# Patient Record
Sex: Male | Born: 1945
Health system: Southern US, Community
[De-identification: ages and names within clinical notes are randomized; demographics above are authoritative.]

## PROBLEM LIST (undated history)

## (undated) DIAGNOSIS — E119 Type 2 diabetes mellitus without complications: Secondary | ICD-10-CM

## (undated) DIAGNOSIS — I519 Heart disease, unspecified: Secondary | ICD-10-CM

## (undated) DIAGNOSIS — I219 Acute myocardial infarction, unspecified: Secondary | ICD-10-CM

## (undated) DIAGNOSIS — Z955 Presence of coronary angioplasty implant and graft: Secondary | ICD-10-CM

## (undated) DIAGNOSIS — I5032 Chronic diastolic (congestive) heart failure: Secondary | ICD-10-CM

## (undated) DIAGNOSIS — R0989 Other specified symptoms and signs involving the circulatory and respiratory systems: Secondary | ICD-10-CM

## (undated) DIAGNOSIS — I2510 Atherosclerotic heart disease of native coronary artery without angina pectoris: Secondary | ICD-10-CM

## (undated) DIAGNOSIS — F32A Depression, unspecified: Secondary | ICD-10-CM

## (undated) DIAGNOSIS — C801 Malignant (primary) neoplasm, unspecified: Secondary | ICD-10-CM

## (undated) DIAGNOSIS — Z951 Presence of aortocoronary bypass graft: Secondary | ICD-10-CM

## (undated) DIAGNOSIS — J449 Chronic obstructive pulmonary disease, unspecified: Secondary | ICD-10-CM

## (undated) DIAGNOSIS — N189 Chronic kidney disease, unspecified: Secondary | ICD-10-CM

## (undated) HISTORY — PX: CORONARY ARTERY BYPASS GRAFT: SHX141

## (undated) HISTORY — DX: Presence of coronary angioplasty implant and graft: Z95.5

## (undated) HISTORY — DX: Malignant (primary) neoplasm, unspecified: C80.1

## (undated) HISTORY — DX: Type 2 diabetes mellitus without complications: E11.9

## (undated) HISTORY — DX: Depression, unspecified: F32.A

## (undated) HISTORY — DX: Chronic obstructive pulmonary disease, unspecified: J44.9

## (undated) HISTORY — PX: EYE SURGERY: SHX253

## (undated) HISTORY — PX: IR STENT PLACEMENT ANTE CAROTID INC ANGIO: IMG5522

## (undated) HISTORY — PX: KNEE SURGERY: SHX244

## (undated) HISTORY — PX: ARTERIAL BYPASS SURGRY: SHX557

## (undated) HISTORY — DX: Heart disease, unspecified: I51.9

## (undated) HISTORY — DX: Chronic kidney disease, unspecified: N18.9

---

## 1992-03-15 HISTORY — PX: TRANSURETHRAL RESECTION OF PROSTATE: SHX73

## 1993-03-15 HISTORY — PX: URINARY SPHINCTER IMPLANT: SHX2624

## 1995-12-26 HISTORY — PX: CHOLECYSTECTOMY, LAPAROSCOPIC: SHX56

## 2011-03-16 DIAGNOSIS — C679 Malignant neoplasm of bladder, unspecified: Secondary | ICD-10-CM

## 2011-03-16 DIAGNOSIS — C801 Malignant (primary) neoplasm, unspecified: Secondary | ICD-10-CM

## 2011-03-16 HISTORY — DX: Malignant neoplasm of bladder, unspecified: C67.9

## 2011-03-16 HISTORY — DX: Malignant (primary) neoplasm, unspecified: C80.1

## 2017-08-10 LAB — HM HEPATITIS C SCREENING LAB: HM Hepatitis Screen: NEGATIVE

## 2018-12-19 DIAGNOSIS — N183 Chronic kidney disease, stage 3 unspecified: Secondary | ICD-10-CM | POA: Diagnosis not present

## 2018-12-26 DIAGNOSIS — Z713 Dietary counseling and surveillance: Secondary | ICD-10-CM | POA: Diagnosis not present

## 2018-12-26 DIAGNOSIS — I129 Hypertensive chronic kidney disease with stage 1 through stage 4 chronic kidney disease, or unspecified chronic kidney disease: Secondary | ICD-10-CM | POA: Diagnosis not present

## 2018-12-26 DIAGNOSIS — N1831 Chronic kidney disease, stage 3a: Secondary | ICD-10-CM | POA: Diagnosis not present

## 2018-12-26 DIAGNOSIS — E1121 Type 2 diabetes mellitus with diabetic nephropathy: Secondary | ICD-10-CM | POA: Diagnosis not present

## 2018-12-29 DIAGNOSIS — E119 Type 2 diabetes mellitus without complications: Secondary | ICD-10-CM | POA: Diagnosis not present

## 2018-12-29 DIAGNOSIS — I10 Essential (primary) hypertension: Secondary | ICD-10-CM | POA: Diagnosis not present

## 2018-12-29 DIAGNOSIS — E1140 Type 2 diabetes mellitus with diabetic neuropathy, unspecified: Secondary | ICD-10-CM | POA: Diagnosis not present

## 2018-12-29 DIAGNOSIS — E785 Hyperlipidemia, unspecified: Secondary | ICD-10-CM | POA: Diagnosis not present

## 2018-12-29 DIAGNOSIS — I2510 Atherosclerotic heart disease of native coronary artery without angina pectoris: Secondary | ICD-10-CM | POA: Diagnosis not present

## 2018-12-29 DIAGNOSIS — Z23 Encounter for immunization: Secondary | ICD-10-CM | POA: Diagnosis not present

## 2019-02-28 DIAGNOSIS — J0100 Acute maxillary sinusitis, unspecified: Secondary | ICD-10-CM | POA: Diagnosis not present

## 2019-02-28 DIAGNOSIS — Z20828 Contact with and (suspected) exposure to other viral communicable diseases: Secondary | ICD-10-CM | POA: Diagnosis not present

## 2019-02-28 DIAGNOSIS — R0981 Nasal congestion: Secondary | ICD-10-CM | POA: Diagnosis not present

## 2019-03-20 DIAGNOSIS — N393 Stress incontinence (female) (male): Secondary | ICD-10-CM | POA: Diagnosis not present

## 2019-03-20 DIAGNOSIS — N400 Enlarged prostate without lower urinary tract symptoms: Secondary | ICD-10-CM | POA: Diagnosis not present

## 2019-03-20 DIAGNOSIS — C670 Malignant neoplasm of trigone of bladder: Secondary | ICD-10-CM | POA: Diagnosis not present

## 2019-05-01 DIAGNOSIS — I2510 Atherosclerotic heart disease of native coronary artery without angina pectoris: Secondary | ICD-10-CM | POA: Diagnosis not present

## 2019-05-01 DIAGNOSIS — E119 Type 2 diabetes mellitus without complications: Secondary | ICD-10-CM | POA: Diagnosis not present

## 2019-05-01 DIAGNOSIS — I10 Essential (primary) hypertension: Secondary | ICD-10-CM | POA: Diagnosis not present

## 2019-05-01 DIAGNOSIS — E785 Hyperlipidemia, unspecified: Secondary | ICD-10-CM | POA: Diagnosis not present

## 2019-07-20 DIAGNOSIS — N390 Urinary tract infection, site not specified: Secondary | ICD-10-CM | POA: Diagnosis not present

## 2019-07-20 DIAGNOSIS — G8929 Other chronic pain: Secondary | ICD-10-CM | POA: Diagnosis not present

## 2019-07-20 DIAGNOSIS — M545 Low back pain: Secondary | ICD-10-CM | POA: Diagnosis not present

## 2019-07-26 DIAGNOSIS — M25561 Pain in right knee: Secondary | ICD-10-CM | POA: Diagnosis not present

## 2019-07-26 DIAGNOSIS — M25562 Pain in left knee: Secondary | ICD-10-CM | POA: Diagnosis not present

## 2019-08-24 DIAGNOSIS — E1165 Type 2 diabetes mellitus with hyperglycemia: Secondary | ICD-10-CM | POA: Diagnosis not present

## 2019-08-24 DIAGNOSIS — N189 Chronic kidney disease, unspecified: Secondary | ICD-10-CM | POA: Diagnosis not present

## 2019-08-24 DIAGNOSIS — I129 Hypertensive chronic kidney disease with stage 1 through stage 4 chronic kidney disease, or unspecified chronic kidney disease: Secondary | ICD-10-CM | POA: Diagnosis not present

## 2019-08-24 DIAGNOSIS — E1140 Type 2 diabetes mellitus with diabetic neuropathy, unspecified: Secondary | ICD-10-CM | POA: Diagnosis not present

## 2019-08-24 DIAGNOSIS — E1121 Type 2 diabetes mellitus with diabetic nephropathy: Secondary | ICD-10-CM | POA: Diagnosis not present

## 2019-08-24 DIAGNOSIS — Z125 Encounter for screening for malignant neoplasm of prostate: Secondary | ICD-10-CM | POA: Diagnosis not present

## 2019-08-24 DIAGNOSIS — E1122 Type 2 diabetes mellitus with diabetic chronic kidney disease: Secondary | ICD-10-CM | POA: Diagnosis not present

## 2019-12-24 DIAGNOSIS — I129 Hypertensive chronic kidney disease with stage 1 through stage 4 chronic kidney disease, or unspecified chronic kidney disease: Secondary | ICD-10-CM | POA: Diagnosis not present

## 2019-12-24 DIAGNOSIS — E1122 Type 2 diabetes mellitus with diabetic chronic kidney disease: Secondary | ICD-10-CM | POA: Diagnosis not present

## 2019-12-24 DIAGNOSIS — E1165 Type 2 diabetes mellitus with hyperglycemia: Secondary | ICD-10-CM | POA: Diagnosis not present

## 2019-12-24 DIAGNOSIS — E785 Hyperlipidemia, unspecified: Secondary | ICD-10-CM | POA: Diagnosis not present

## 2019-12-24 DIAGNOSIS — E1121 Type 2 diabetes mellitus with diabetic nephropathy: Secondary | ICD-10-CM | POA: Diagnosis not present

## 2019-12-24 DIAGNOSIS — Z23 Encounter for immunization: Secondary | ICD-10-CM | POA: Diagnosis not present

## 2019-12-24 DIAGNOSIS — E1140 Type 2 diabetes mellitus with diabetic neuropathy, unspecified: Secondary | ICD-10-CM | POA: Diagnosis not present

## 2020-03-19 DIAGNOSIS — N400 Enlarged prostate without lower urinary tract symptoms: Secondary | ICD-10-CM | POA: Diagnosis not present

## 2020-03-19 DIAGNOSIS — C670 Malignant neoplasm of trigone of bladder: Secondary | ICD-10-CM | POA: Diagnosis not present

## 2020-04-18 ENCOUNTER — Encounter: Payer: Self-pay | Admitting: Adult Health

## 2020-04-18 ENCOUNTER — Other Ambulatory Visit: Payer: Self-pay

## 2020-04-18 ENCOUNTER — Ambulatory Visit (INDEPENDENT_AMBULATORY_CARE_PROVIDER_SITE_OTHER): Payer: Medicare Other | Admitting: Adult Health

## 2020-04-18 VITALS — BP 154/64 | HR 56 | Temp 98.5°F | Resp 18 | Ht 70.0 in | Wt 292.0 lb

## 2020-04-18 DIAGNOSIS — J449 Chronic obstructive pulmonary disease, unspecified: Secondary | ICD-10-CM | POA: Diagnosis not present

## 2020-04-18 DIAGNOSIS — I2510 Atherosclerotic heart disease of native coronary artery without angina pectoris: Secondary | ICD-10-CM | POA: Insufficient documentation

## 2020-04-18 DIAGNOSIS — E118 Type 2 diabetes mellitus with unspecified complications: Secondary | ICD-10-CM

## 2020-04-18 DIAGNOSIS — E119 Type 2 diabetes mellitus without complications: Secondary | ICD-10-CM | POA: Insufficient documentation

## 2020-04-18 DIAGNOSIS — I252 Old myocardial infarction: Secondary | ICD-10-CM

## 2020-04-18 DIAGNOSIS — Z87891 Personal history of nicotine dependence: Secondary | ICD-10-CM | POA: Insufficient documentation

## 2020-04-18 DIAGNOSIS — E1142 Type 2 diabetes mellitus with diabetic polyneuropathy: Secondary | ICD-10-CM | POA: Insufficient documentation

## 2020-04-18 DIAGNOSIS — Z6841 Body Mass Index (BMI) 40.0 and over, adult: Secondary | ICD-10-CM

## 2020-04-18 DIAGNOSIS — Z1389 Encounter for screening for other disorder: Secondary | ICD-10-CM | POA: Diagnosis not present

## 2020-04-18 DIAGNOSIS — E559 Vitamin D deficiency, unspecified: Secondary | ICD-10-CM

## 2020-04-18 DIAGNOSIS — Z955 Presence of coronary angioplasty implant and graft: Secondary | ICD-10-CM | POA: Insufficient documentation

## 2020-04-18 DIAGNOSIS — N1831 Chronic kidney disease, stage 3a: Secondary | ICD-10-CM | POA: Insufficient documentation

## 2020-04-18 DIAGNOSIS — I25810 Atherosclerosis of coronary artery bypass graft(s) without angina pectoris: Secondary | ICD-10-CM | POA: Insufficient documentation

## 2020-04-18 LAB — POCT URINALYSIS DIPSTICK
Bilirubin, UA: NEGATIVE
Blood, UA: NEGATIVE
Glucose, UA: NEGATIVE
Ketones, UA: NEGATIVE
Leukocytes, UA: NEGATIVE
Nitrite, UA: NEGATIVE
Protein, UA: POSITIVE — AB
Spec Grav, UA: 1.015 (ref 1.010–1.025)
Urobilinogen, UA: 0.2 E.U./dL
pH, UA: 5 (ref 5.0–8.0)

## 2020-04-18 MED ORDER — BLOOD GLUCOSE METER KIT: PACK | 0 refills | Status: DC

## 2020-04-18 MED ORDER — ALBUTEROL SULFATE HFA 108 (90 BASE) MCG/ACT IN AERS: 2.0000 | INHALATION_SPRAY | Freq: Four times a day (QID) | RESPIRATORY_TRACT | 2 refills | Status: DC | PRN

## 2020-04-18 MED ORDER — ADVAIR HFA 230-21 MCG/ACT IN AERO: 1.0000 | INHALATION_SPRAY | Freq: Two times a day (BID) | RESPIRATORY_TRACT | 12 refills | Status: DC

## 2020-04-18 NOTE — Progress Notes (Signed)
New patient visit   Patient: Tony Williams   DOB: [birth year]   74 y.o. Male  MRN: [id number] Visit Date: 04/18/2020  Today's healthcare provider: Marcille Buffy, FNP   Chief Complaint  Patient presents with  . New Patient (Initial Visit)   Subjective    Tony Williams is a 75 y.o. male who presents today as a new patient to establish care.  HPI  Patient presents in office today to establish care he states that he feels fairly well today but does have concerns to address. Patient reports that he moved from [place], [place] and would like to discuss his medications today.  He saw Dr. Gerarda Fraction and he moved and is here today to get a new primary care due to moving.   2007 quadruple bypass, and has had coronary stents in past.  He needs a cardiologist, he has not seen Dr. Carleene Mains in [place] [facility] in 2 years since covid pandeic.   He has been covid vaccinated x 2 and had booster.  He sees urologist.Dr. Gloris Ham and sees urologist, history of bladder cancer and has and AUS - artificial bladder spinchter.  He reports he has only been taking his metoprolol 25 mg once daily, but he is never taken it twice daily and that he takes his Amaryl 1 mg twice daily.  He does not check his blood glucose at home, he reports he does not have a meter supplies.  He has chronic shortness of breath, he reports he has seen specialist, he has COPD and right lung scarring. He reports shoirtness of breath is chronic shortness of breath unchaneged. Quit smoking since 2005 , 1ppd for around 30 days.  Discussed he would qualify for low-dose CT scan, patient declines at this time.  Patient reports that he has been taking hydrocodone 12/16/2023 for many years for his chronic back pain, he does not want any further work-up done as he is told he is not a surgical candidate due to his lungs he has history of CABG, he has chronic bilateral feet pain does not want any work-up done for that as well.  I can see a  history that he has followed up consistently with his primary care in [place] and he reports that he does not have any abnormal side effects of the medication and is able to function no drowsiness.  And he takes only as needed but does have to take most days.  He has never seen pain management.  He reports that he has seen pulmonary in the past and was told that there was nothing else they can do for him, I did offer him a pulmonary referral today but he declines.  He has been using albuterol 3 times a day.   Patient reports that he has a general diet, he is not actively exercising at this time due to complaints of shortness of breath on exertion.  Patient reports that sleep patterns are fair.    Denies dizziness, lightheadedness, pre syncopal or syncopal episodes.   Past Medical History:  Diagnosis Date  . Cancer ([place])   . Chronic kidney disease   . COPD (chronic obstructive pulmonary disease) ([place])   . Depression   . Diabetes mellitus without complication ([place])   . Heart problem    Past Surgical History:  Procedure Laterality Date  . EYE SURGERY     Family Status  Relation Name Status  . Mother  (Not Specified)  . Father  (Not Specified)   Family History  Problem Relation Age of Onset  . Heart Problems Mother   . Heart Problems Father    Social History   Socioeconomic History  . Marital status: Married    Spouse name: Not on file  . Number of children: Not on file  . Years of education: Not on file  . Highest education level: Not on file  Occupational History  . Not on file  Tobacco Use  . Smoking status: Former Smoker    Quit date: 10/23/2003    Years since quitting: 16.4  . Smokeless tobacco: Never Used  Substance and Sexual Activity  . Alcohol use: Never  . Drug use: Never  . Sexual activity: Not on file  Other Topics Concern  . Not on file  Social History Narrative  . Not on file   Social Determinants of Health   Financial Resource Strain: Not on file   Food Insecurity: Not on file  Transportation Needs: Not on file  Physical Activity: Not on file  Stress: Not on file  Social Connections: Not on file   Outpatient Medications Prior to Visit  Medication Sig  . allopurinol (ZYLOPRIM) 300 MG tablet Take 300 mg by mouth daily.  Mar[place] Kitchen amLODipine (NORVASC) 10 MG tablet Take 10 mg by mouth daily.  Mar[place] Kitchen aspirin EC 81 MG tablet Take 81 mg by mouth daily. Swallow whole.  . Cholecalciferol (VITAMIN D3 PO) Take 4,000 Units by mouth.  . clopidogrel (PLAVIX) 75 MG tablet Take 75 mg by mouth daily.  . Coenzyme Q10 (CO Q-10) 400 MG CAPS Take by mouth.  . escitalopram (LEXAPRO) 20 MG tablet Take 20 mg by mouth daily.  Marland Kitchen gabapentin (NEURONTIN) 300 MG capsule Take 300 mg by mouth at bedtime.  Marland Kitchen glimepiride (AMARYL) 1 MG tablet Take by mouth 2 (two) times daily.  Marland Kitchen HYDROcodone-acetaminophen (NORCO) 10-325 MG tablet Take 1 tablet by mouth 3 (three) times daily.  Marland Kitchen lovastatin (MEVACOR) 40 MG tablet Take 40 mg by mouth daily.  . metoprolol tartrate (LOPRESSOR) 25 MG tablet Take 25 mg by mouth daily at 6 (six) AM.  . Omega-3 Fatty Acids (FISH OIL) 1200 MG CAPS Take by mouth.  . traZODone (DESYREL) 150 MG tablet Take 150-300 mg by mouth at bedtime as needed.   No facility-administered medications prior to visit.   No Known Allergies   There is no immunization history on file for this patient.  Health Maintenance  Topic Date Due  . HEMOGLOBIN A1C  Never done  . Hepatitis C Screening  Never done  . COVID-19 Vaccine (1) Never done  . FOOT EXAM  Never done  . OPHTHALMOLOGY EXAM  Never done  . URINE MICROALBUMIN  Never done  . TETANUS/TDAP  Never done  . COLONOSCOPY (Pts 45-36yr Insurance coverage will need to be confirmed)  Never done  . PNA vac Low Risk Adult (1 of 2 - PCV13) Never done  . INFLUENZA VACCINE  Never done    Patient Care Team: FDoreen Beam FNP as PCP - General (Family Medicine)  Review of Systems  Respiratory: Positive for  shortness of breath.   Musculoskeletal: Positive for arthralgias.  Psychiatric/Behavioral: Positive for sleep disturbance.  All other systems reviewed and are negative.     Objective    BP (!) 154/64   Pulse (!) 56   Temp 98.5 F (36.9 C) (Oral)   Resp 18   Ht 5' 10" (1.778 m)   Wt 292 lb (132.5 kg)   SpO2 92%   BMI  41.90 kg/m  Physical Exam Vitals reviewed.  Constitutional:      General: He is not in acute distress.    Appearance: Normal appearance. He is obese. He is not ill-appearing, toxic-appearing or diaphoretic.  HENT:     Head: Normocephalic and atraumatic.     Right Ear: Tympanic membrane, ear canal and external ear normal.     Left Ear: Tympanic membrane, ear canal and external ear normal.     Nose: Nose normal. No congestion or rhinorrhea.     Mouth/Throat:     Mouth: Mucous membranes are moist.     Pharynx: Oropharynx is clear.  Eyes:     General: No scleral icterus.       Right eye: No discharge.        Left eye: No discharge.     Extraocular Movements: Extraocular movements intact.     Conjunctiva/sclera: Conjunctivae normal.     Pupils: Pupils are equal, round, and reactive to light.  Cardiovascular:     Rate and Rhythm: Regular rhythm. Bradycardia present.     Pulses: Normal pulses.     Heart sounds: Normal heart sounds.  Pulmonary:     Effort: Pulmonary effort is normal.     Breath sounds: Normal breath sounds.  Abdominal:     General: There is no distension.     Palpations: Abdomen is soft.     Tenderness: There is no abdominal tenderness.     Comments: Round   Musculoskeletal:        General: Normal range of motion.  Skin:    General: Skin is warm.  Neurological:     Mental Status: He is alert and oriented to person, place, and time.     Cranial Nerves: No cranial nerve deficit.     Motor: No weakness.     Gait: Gait normal.  Psychiatric:        Mood and Affect: Mood normal.        Behavior: Behavior normal.        Thought Content:  Thought content normal.        Judgment: Judgment normal.      Depression Screen PHQ 2/9 Scores 04/18/2020  Exception Documentation Patient refusal   Results for orders placed or performed in visit on 04/18/20  POCT urinalysis dipstick  Result Value Ref Range   Color, UA dark yellow    Clarity, UA clear    Glucose, UA Negative Negative   Bilirubin, UA negative    Ketones, UA negative    Spec Grav, UA 1.015 1.010 - 1.025   Blood, UA negative    pH, UA 5.0 5.0 - 8.0   Protein, UA Positive (A) Negative   Urobilinogen, UA 0.2 0.2 or 1.0 E.U./dL   Nitrite, UA negative    Leukocytes, UA Negative Negative   Appearance     Odor      Assessment & Plan     1. Chronic obstructive pulmonary disease, unspecified COPD type [facility]) Provider recommends follow-up with pulmonary, offered to make a referral, however patient declines at this time.  He does request refill on his albuterol inhaler which he is using around 3 times daily.  Also advised patient that I will send in Advair Diskus for him to use twice daily.  Strongly encourage patient to request pulmonary referral when he is ready. Also has a 30-year 1 pack of tobacco history, he has quit.  Recommend low-dose CT screening and pulmonary follow-up.  2. Screening for blood  or protein in urine  - POCT urinalysis dipstick  3. Vitamin D insufficiency See medications he is currently on vitamin D and has not had his levels checked recently. - VITAMIN D 25 Hydroxy (Vit-D Deficiency, Fractures)  4. Coronary artery disease with hx of myocardial infarct w/o hx of CABG I do not have his previous records from cardiology, he does request a new cardiologist here in town since he moved from [place] will refer [facility].  Patient is to call if he has not heard from cardiology within 2 weeks. - Ambulatory referral to Cardiology  5. Controlled type 2 diabetes mellitus with complication, without long-term current use of insulin  (HCC) We will check A1c today and kidney function.  Prescribed a meter given hard prescription for supplies and meter per insurance. - CBC with Differential/Platelet - Lipid panel - TSH - PSA - HgB A1c - blood glucose meter kit and supplies; Dispense based on patient and insurance preference. Use up to four times daily as directed. (FOR ICD-10 E10.9, E11.9). check blood glucose fasting and before meals four times daily.  Dispense: 1 each; Refill: 0  6. S/P primary angioplasty with coronary stent  - Ambulatory referral to Cardiology  Diet and lifestyle changes advised. Body mass index is 41.9 kg/m. Weight loss.   Red Flags discussed. The patient was given clear instructions to go to ER or return to medical center if any red flags develop, symptoms do not improve, worsen or new problems develop. They verbalized understanding.  Return in about 3 months (around 07/16/2020), or if symptoms worsen or fail to improve, for at any time for any worsening symptoms, Go to Emergency room/ urgent care if worse. The entirety of the information documented in the History of Present Illness, Review of Systems and Physical Exam were personally obtained by me. Portions of this information were initially documented by the CMA and reviewed by me for thoroughness and accuracy.   Records request submitted.  Marcille Buffy, [place] [phone] (phone) [phone] (fax)  [place]

## 2020-04-18 NOTE — Patient Instructions (Signed)
Health Maintenance, Male Adopting a healthy lifestyle and getting preventive care are important in promoting health and wellness. Ask your health care provider about:  The right schedule for you to have regular tests and exams.  Things you can do on your own to prevent diseases and keep yourself healthy. What should I know about diet, weight, and exercise? Eat a healthy diet  Eat a diet that includes plenty of vegetables, fruits, low-fat dairy products, and lean protein.  Do not eat a lot of foods that are high in solid fats, added sugars, or sodium.   Maintain a healthy weight Body mass index (BMI) is a measurement that can be used to identify possible weight problems. It estimates body fat based on height and weight. Your health care provider can help determine your BMI and help you achieve or maintain a healthy weight. Get regular exercise Get regular exercise. This is one of the most important things you can do for your health. Most adults should:  Exercise for at least 150 minutes each week. The exercise should increase your heart rate and make you sweat (moderate-intensity exercise).  Do strengthening exercises at least twice a week. This is in addition to the moderate-intensity exercise.  Spend less time sitting. Even light physical activity can be beneficial. Watch cholesterol and blood lipids Have your blood tested for lipids and cholesterol at 74 years of age, then have this test every 5 years. You may need to have your cholesterol levels checked more often if:  Your lipid or cholesterol levels are high.  You are older than 74 years of age.  You are at high risk for heart disease. What should I know about cancer screening? Many types of cancers can be detected early and may often be prevented. Depending on your health history and family history, you may need to have cancer screening at various ages. This may include screening for:  Colorectal cancer.  Prostate  cancer.  Skin cancer.  Lung cancer. What should I know about heart disease, diabetes, and high blood pressure? Blood pressure and heart disease  High blood pressure causes heart disease and increases the risk of stroke. This is more likely to develop in people who have high blood pressure readings, are of African descent, or are overweight.  Talk with your health care provider about your target blood pressure readings.  Have your blood pressure checked: ? Every 3-5 years if you are 18-39 years of age. ? Every year if you are 40 years old or older.  If you are between the ages of 65 and 75 and are a current or former smoker, ask your health care provider if you should have a one-time screening for abdominal aortic aneurysm (AAA). Diabetes Have regular diabetes screenings. This checks your fasting blood sugar level. Have the screening done:  Once every three years after age 45 if you are at a normal weight and have a low risk for diabetes.  More often and at a younger age if you are overweight or have a high risk for diabetes. What should I know about preventing infection? Hepatitis B If you have a higher risk for hepatitis B, you should be screened for this virus. Talk with your health care provider to find out if you are at risk for hepatitis B infection. Hepatitis C Blood testing is recommended for:  Everyone born from 1945 through 1965.  Anyone with known risk factors for hepatitis C. Sexually transmitted infections (STIs)  You should be screened each   year for STIs, including gonorrhea and chlamydia, if: ? You are sexually active and are younger than 74 years of age. ? You are older than 74 years of age and your health care provider tells you that you are at risk for this type of infection. ? Your sexual activity has changed since you were last screened, and you are at increased risk for chlamydia or gonorrhea. Ask your health care provider if you are at risk.  Ask your  health care provider about whether you are at high risk for HIV. Your health care provider may recommend a prescription medicine to help prevent HIV infection. If you choose to take medicine to prevent HIV, you should first get tested for HIV. You should then be tested every 3 months for as long as you are taking the medicine. Follow these instructions at home: Lifestyle  Do not use any products that contain nicotine or tobacco, such as cigarettes, e-cigarettes, and chewing tobacco. If you need help quitting, ask your health care provider.  Do not use street drugs.  Do not share needles.  Ask your health care provider for help if you need support or information about quitting drugs. Alcohol use  Do not drink alcohol if your health care provider tells you not to drink.  If you drink alcohol: ? Limit how much you have to 0-2 drinks a day. ? Be aware of how much alcohol is in your drink. In the [place] one drink equals one 12 oz bottle of beer (355 mL), one 5 oz glass of wine (148 mL), or one 1 oz glass of hard liquor (44 mL). General instructions  Schedule regular health, dental, and eye exams.  Stay current with your vaccines.  Tell your health care provider if: ? You often feel depressed. ? You have ever been abused or do not feel safe at home. Summary  Adopting a healthy lifestyle and getting preventive care are important in promoting health and wellness.  Follow your health care provider's instructions about healthy diet, exercising, and getting tested or screened for diseases.  Follow your health care provider's instructions on monitoring your cholesterol and blood pressure. This information is not intended to replace advice given to you by your health care provider. Make sure you discuss any questions you have with your health care provider. Document Revised: 02/22/2018 Document Reviewed: 02/22/2018 Elsevier Patient Education  2021 [place]. Albuterol Nebulizer  Solution What is this medicine? ALBUTEROL (al Norm[place]) is a bronchodilator. It treats bronchospasm. Bronchospasm is when you have trouble breathing and make loud or whistling sounds when you breathe. This drug opens the airways in the lungs so it is easier to breathe. This medicine may be used for other purposes; ask your health care provider or pharmacist if you have questions. COMMON BRAND NAME(S): Accuneb, Proventil What should I tell my health care provider before I take this medicine? They need to know if you have any of the following conditions:  diabetes (high blood sugar)  heart disease  high blood pressure  irregular heartbeat or rhythm  pheochromocytoma  seizures  thyroid disease  an unusual or allergic reaction to albuterol, levalbuterol, other medicines, foods, dyes, or preservatives  pregnant or trying to get pregnant  breast-feeding How should I use this medicine? This medicine is for inhalation using a nebulizer. Nebulizers make a liquid into an aerosol that you breathe in through your mouth or your mouth and nose and into your lungs. Do not mix this medicine with  other medicines in the nebulizer. Take it as directed on the prescription label. Do not use it more often than directed. This medicine comes with INSTRUCTIONS FOR USE. Ask your pharmacist for directions on how to use this medicine. Read the information carefully. Talk to your pharmacist or health care provider if you have questions. Talk to your health care provider about the use of this medicine in children. While it may be given to children as young as 2 for selected conditions, precautions do apply. Overdosage: If you think you have taken too much of this medicine contact a poison control center or emergency room at once. NOTE: This medicine is only for you. Do not share this medicine with others. What if I miss a dose? If you take this medication on a regular basis, take it as soon as you can. If it  is almost time for your next dose, take only that dose. Do not take double or extra doses. What may interact with this medicine?  anti-infectives like chloroquine and pentamidine  caffeine  cisapride  diuretics  medicines for colds  medicines for depression or emotional or psychotic conditions  medicines for weight loss including some herbal products  methadone  some antibiotics like clarithromycin, erythromycin, levofloxacin, and linezolid  some heart medicines  steroid hormones like dexamethasone, cortisone, hydrocortisone  theophylline  thyroid hormones This list may not describe all possible interactions. Give your health care provider a list of all the medicines, herbs, non-prescription drugs, or dietary supplements you use. Also tell them if you smoke, drink alcohol, or use illegal drugs. Some items may interact with your medicine. What should I watch for while using this medicine? Visit your health care provider for regular checks on your progress. Tell your health care provider if your symptoms do not start to get better or if they get worse. If your symptoms get worse or if you are using this medicine more than normal, call your health care provider right away. Do not treat yourself for coughs, colds or allergies without asking your health care provider for advice. Some nonprescription medicines can affect this one. You and your health care provider should develop an Asthma Action Plan that is just for you. Be sure to know what to do if you are in the yellow (asthma is getting worse) or red (medical alert) zones. Your mouth may get dry. Chewing sugarless gum or sucking hard candy and drinking plenty of water may help. Contact your health care provider if the problem does not go away or is severe. What side effects may I notice from receiving this medicine? Side effects that you should report to your doctor or health care professional as soon as possible:  allergic  reactions (skin rash, itching or hives; swelling of the face, lips, or tongue)  fever  heartbeat rhythm changes (trouble breathing; chest pain; dizziness; fast, irregular heartbeat; feeling faint or lightheaded, falls)  increase in blood pressure  muscle cramps, pain  muscle weakness  pain, tingling, numbness in the hands or feet  vomiting Side effects that usually do not require medical attention (report to your doctor or health care professional if they continue or are bothersome):  changes in taste  cough  dry mouth  headache  nasal congestion (runny or stuffy nose)  sore throat  tremors  trouble sleeping  upset stomach This list may not describe all possible side effects. Call your doctor for medical advice about side effects. You may report side effects to FDA at 1-800-FDA-1088.  Where should I keep my medicine? Keep out of the reach of children and pets. Store at room temperature between 20 and 25 degrees C (68 and 77 degrees F). Protect from light. Avoid exposure to extreme heat. Keep unopened vials in the foil pouch. See product for storage information. Each product may have different instructions. Get rid of any unused medicine after it expires or 10 days after removing it from the foil pouch, whichever is first. NOTE: This sheet is a summary. It may not cover all possible information. If you have questions about this medicine, talk to your doctor, pharmacist, or health care provider.  2021 Elsevier/Gold Standard (2020-02-15 10:53:40) Fluticasone; Salmeterol inhalation powder What is this medicine? FLUTICASONE; SALMETEROL (floo TIK a sone; sal ME te role) is a combination of 2 medicines to treat asthma and COPD. Salmeterol is a bronchodilator that helps keep airways open. Fluticasone decreases inflammation in the lungs. Do not use these medicines for acute asthma attacks or bronchospasm. This medicine may be used for other purposes; ask your health care provider or  pharmacist if you have questions. COMMON BRAND NAME(S): Advair, AirDuo Digihaler, Airduo RespiClick What should I tell my health care provider before I take this medicine? They need to know if you have any of these conditions:  diabetes (high blood sugar)  eye disease, such as glaucoma, cataracts, or blurred vision  heart disease  high blood pressure  immune system problems  infections, such as tuberculosis (TB) or other bacterial, fungal, or viral infections  irregular heartbeat or rhythm  liver disease  osteoporosis, weak bones  seizures  taking other steroids like dexamethasone or prednisone  thyroid disease  an unusual or allergic reaction to fluticasone, salmeterol, other corticosteroids, other medicines, foods, dyes, or preservatives  pregnant or trying to get pregnant  breast-feeding How should I use this medicine? This medicine is inhaled through the mouth. Rinse your mouth with water after use. Make sure not to swallow the water. Take it as directed on the prescription label at the same time every day. Do not use it more often than directed. This medicine comes with INSTRUCTIONS FOR USE. Ask your pharmacist for directions on how to use this medicine. Read the information carefully. Talk to your pharmacist or health care provider if you have questions. Talk to your health care provider about the use of this medicine in children. While it may be prescribed for children as young as 4 for selected conditions, precautions do apply. Overdosage: If you think you have taken too much of this medicine contact a poison control center or emergency room at once. NOTE: This medicine is only for you. Do not share this medicine with others. What if I miss a dose? If you miss a dose, use it as soon as you can. If it is almost time for your next dose, use only that dose. Do not use double or extra doses. What may interact with this medicine? Do not take this medicine with any of the  following medications:  MAOIs like Carbex, Eldepryl, Marplan, Nardil, and Parnate This medicine may also interact with the following medications:  aminophylline or theophylline  antiviral medicines for HIV or AIDS  beta-blockers like metoprolol and propranolol  certain antibiotics like clarithromycin, erythromycin, levofloxacin, linezolid, and telithromycin  certain medicines for fungal infections like ketoconazole, itraconazole, posaconazole, voriconazole  conivaptan  diuretics  medicines for colds  medicines for depression or emotional conditions  nefazodone  vaccines This list may not describe all possible interactions. Give  your health care provider a list of all the medicines, herbs, non-prescription drugs, or dietary supplements you use. Also tell them if you smoke, drink alcohol, or use illegal drugs. Some items may interact with your medicine. What should I watch for while using this medicine? Visit your health care provider for regular checks on your progress. Tell your health care provider if your symptoms do not start to get better or if they get worse. Talk to your health care provider about how to treat an acute asthma attack or bronchospasm (wheezing). Be sure to always have a short-acting inhaler with you. If you use your short-acting inhaler and your symptoms do not get better or if they get worse, call your health care provider right away. If you have asthma, you and your health care provider should develop an Asthma Action Plan that is just for you. Be sure to know what to do if you are in the yellow (asthma is getting worse) or red (medical alert) zones. This medicine can worsen breathing or cause wheezing right after you use it. Be sure you have a short-acting inhaler for acute attacks (wheezing) nearby. If this happens, stop using this medicine right away and call your health care provider. This medicine may increase your risk of dying from asthma-related  problems. Talk to your health care provider if you have questions. This medicine may increase your risk of getting an infection. Call your health care provider for advice if you get a fever, chills, sore throat, or other symptoms of a cold or flu. Do not treat yourself. Try to avoid being around people who are sick. If you have not had the measles or chickenpox vaccines, tell your health care provider right away if you are around someone with these viruses. This medicine may slow your child's growth if it is taken for a long time at high doses. Your health care provider will monitor your child's growth. Using this medicine for a long time may weaken your bones. The risk of bone fractures may be increased. Talk to your health care provider about your bone health. This medicine may increase blood sugar. Ask your health care provider if changes in diet or medicines are needed if you have diabetes. Do not treat yourself for coughs, colds or allergies without asking your health care provider for advice. Some nonprescription medicines can affect this one. What side effects may I notice from receiving this medicine? Side effects that you should report to your doctor or health care professional as soon as possible:  allergic reactions (skin rash, itching or hives; swelling of the face, lips, or tongue)  changes in vision  chest pain  fast, irregular heartbeat  high blood sugar (increased hunger, thirst or urination; unusually weak or tired, blurry vision)  increase in blood pressure  infection (fever, chills, cough, sore throat, pain or trouble passing urine)  low adrenal gland function (nausea; vomiting; loss of appetite; unusually weak or tired; dizziness; low blood pressure)  thrush (white patches in the mouth or mouth sores)  trouble breathing Side effects that usually do not require medical attention (report to your doctor or health care professional if they continue or are  bothersome):  changes in taste  cough  headache  hoarseness, sore throat  tremors This list may not describe all possible side effects. Call your doctor for medical advice about side effects. You may report side effects to FDA at 1-800-FDA-1088. Where should I keep my medicine? Keep out of the reach of  children and pets. Store at room temperature between 20 and 25 degrees C (68 and 77 degrees F). Keep inhaler away from extreme heat or humidity. Get rid of it 1 month after removing it from the foil pouch, when the dose counter reads "0" or after the expiration date, whichever is first. NOTE: This sheet is a summary. It may not cover all possible information. If you have questions about this medicine, talk to your doctor, pharmacist, or health care provider.  2021 Elsevier/Gold Standard (2018-12-21 13:20:08)

## 2020-04-22 ENCOUNTER — Other Ambulatory Visit: Payer: Self-pay | Admitting: Adult Health

## 2020-04-22 DIAGNOSIS — E118 Type 2 diabetes mellitus with unspecified complications: Secondary | ICD-10-CM | POA: Diagnosis not present

## 2020-04-22 DIAGNOSIS — E559 Vitamin D deficiency, unspecified: Secondary | ICD-10-CM | POA: Diagnosis not present

## 2020-04-23 ENCOUNTER — Encounter: Payer: Self-pay | Admitting: Adult Health

## 2020-04-23 LAB — CBC WITH DIFFERENTIAL/PLATELET
Basophils Absolute: 0.1 10*3/uL (ref 0.0–0.2)
Basos: 1 %
EOS (ABSOLUTE): 0.5 10*3/uL — ABNORMAL HIGH (ref 0.0–0.4)
Eos: 5 %
Hematocrit: 46.9 % (ref 37.5–51.0)
Hemoglobin: 15.6 g/dL (ref 13.0–17.7)
Immature Grans (Abs): 0 10*3/uL (ref 0.0–0.1)
Immature Granulocytes: 0 %
Lymphocytes Absolute: 1.6 10*3/uL (ref 0.7–3.1)
Lymphs: 18 %
MCH: 29.8 pg (ref 26.6–33.0)
MCHC: 33.3 g/dL (ref 31.5–35.7)
MCV: 90 fL (ref 79–97)
Monocytes Absolute: 0.9 10*3/uL (ref 0.1–0.9)
Monocytes: 10 %
Neutrophils Absolute: 5.8 10*3/uL (ref 1.4–7.0)
Neutrophils: 66 %
Platelets: 221 10*3/uL (ref 150–450)
RBC: 5.24 x10E6/uL (ref 4.14–5.80)
RDW: 14.4 % (ref 11.6–15.4)
WBC: 8.9 10*3/uL (ref 3.4–10.8)

## 2020-04-23 LAB — PSA: Prostate Specific Ag, Serum: <0.1 ng/mL (ref 0.0–4.0)

## 2020-04-23 LAB — VITAMIN D 25 HYDROXY (VIT D DEFICIENCY, FRACTURES): Vit D, 25-Hydroxy: 38.8 ng/mL (ref 30.0–100.0)

## 2020-04-23 LAB — HEMOGLOBIN A1C
Est. average glucose Bld gHb Est-mCnc: 137 mg/dL
Hgb A1c MFr Bld: 6.4 % — ABNORMAL HIGH (ref 4.8–5.6)

## 2020-04-23 LAB — LIPID PANEL
Chol/HDL Ratio: 6.4 ratio — ABNORMAL HIGH (ref 0.0–5.0)
Cholesterol, Total: 218 mg/dL — ABNORMAL HIGH (ref 100–199)
HDL: 34 mg/dL — ABNORMAL LOW (ref >39)
LDL Chol Calc (NIH): 133 mg/dL — ABNORMAL HIGH (ref 0–99)
Triglycerides: 282 mg/dL — ABNORMAL HIGH (ref 0–149)
VLDL Cholesterol Cal: 51 mg/dL — ABNORMAL HIGH (ref 5–40)

## 2020-04-23 LAB — TSH: TSH: 4.1 u[IU]/mL (ref 0.450–4.500)

## 2020-04-23 NOTE — Telephone Encounter (Signed)
Requested medication (s) are due for refill today:  Not sure  Requested medication (s) are on the active medication list:   Yes from 01/21/2020 from a historical provider  Future visit scheduled:   Yes in 2 mo. With Flinchum   Last ordered: ?04/18/2020 or 01/21/2020  No prescribing details with either.    One note indicating this is to replace metformin possibly?  Clinic note:  Returned because last prescribed by a historical provider and not sure if it's to replace metformin.   Requested Prescriptions  Pending Prescriptions Disp Refills   glimepiride (AMARYL) 1 MG tablet [Pharmacy Med Name: GLIMEPIRIDE 1MG  TABLETS] 180 tablet     Sig: TAKE 1 TABLET BY MOUTH TWICE DAILY, THIS REPLACES METFORMIN      Endocrinology:  Diabetes - Sulfonylureas Failed - 04/22/2020  6:59 PM      Failed - HBA1C is between 0 and 7.9 and within 180 days    Hgb A1c MFr Bld  Date Value Ref Range Status  04/22/2020 6.4 (H) 4.8 - 5.6 % Final    Comment:             Prediabetes: 5.7 - 6.4          Diabetes: >6.4          Glycemic control for adults with diabetes: <7.0           Passed - Valid encounter within last 6 months    Recent Outpatient Visits           5 days ago Chronic obstructive pulmonary disease, unspecified COPD type [facility])   Buckner, Kelby Aline, FNP       Future Appointments             In 1 week Agbor-Etang, Aaron Edelman, MD [facility], [facility]   In 2 months Flinchum, Kelby Aline, [place], [place]

## 2020-04-23 NOTE — Progress Notes (Signed)
TSH for thyroid, PSA for prostate and vitamin D is within normal limits.  CBC is ok.   Total cholesterol,triglycerides  and LDL elevated. HDL cholesterol  Discuss lifestyle modification with patient e.g. increase exercise, fiber, fruits, vegetables, lean meat, and omega 3/fish intake and decrease saturated fat.  If patient following strict diet and exercise program already please schedule follow up appointment with primary care physician Verify if he has been taking his lovastatin 40mg  daily ? Was he fasting ?  Hemoglobin A1C is in prediabetes range . Strict dietary changes and lifestyle changes increase exercise advised. Continue diabetes medication. Continue fish oil.

## 2020-04-24 LAB — SPECIMEN STATUS REPORT

## 2020-04-24 LAB — COMPREHENSIVE METABOLIC PANEL
ALT: 11 IU/L (ref 0–44)
AST: 10 IU/L (ref 0–40)
Albumin/Globulin Ratio: 1.5 (ref 1.2–2.2)
Albumin: 3.8 g/dL (ref 3.7–4.7)
Alkaline Phosphatase: 53 IU/L (ref 44–121)
BUN/Creatinine Ratio: 15 (ref 10–24)
BUN: 23 mg/dL (ref 8–27)
Bilirubin Total: 0.3 mg/dL (ref 0.0–1.2)
CO2: 21 mmol/L (ref 20–29)
Calcium: 10.1 mg/dL (ref 8.6–10.2)
Chloride: 99 mmol/L (ref 96–106)
Creatinine, Ser: 1.55 mg/dL — ABNORMAL HIGH (ref 0.76–1.27)
GFR calc Af Amer: 51 mL/min/{1.73_m2} — ABNORMAL LOW (ref >59)
GFR calc non Af Amer: 44 mL/min/{1.73_m2} — ABNORMAL LOW (ref >59)
Globulin, Total: 2.6 g/dL (ref 1.5–4.5)
Glucose: 132 mg/dL — ABNORMAL HIGH (ref 65–99)
Potassium: 5.4 mmol/L — ABNORMAL HIGH (ref 3.5–5.2)
Sodium: 141 mmol/L (ref 134–144)
Total Protein: 6.4 g/dL (ref 6.0–8.5)

## 2020-04-24 NOTE — Progress Notes (Signed)
Glucose is elevated.  Kidney function decreased, recommend hydrating, avoiding NSAID's such as ibupropfen, aleve. Motrin and recheck fasting CMP in 1 months.  Potassium slightly increased- is he taking any supplement ?  May just be from dehydration.

## 2020-04-25 ENCOUNTER — Telehealth: Payer: Self-pay | Admitting: Adult Health

## 2020-04-25 ENCOUNTER — Other Ambulatory Visit: Payer: Self-pay | Admitting: Adult Health

## 2020-04-25 MED ORDER — HYDROCODONE-ACETAMINOPHEN 10-325 MG PO TABS: 1.0000 | ORAL_TABLET | Freq: Three times a day (TID) | ORAL | 0 refills | Status: DC

## 2020-04-25 NOTE — Telephone Encounter (Signed)
Requested medication (s) are due for refill today: historical med  Requested medication (s) are on the active medication list: yes  Last refill:  01/29/20  Future visit scheduled: yes in 2 months   Notes to clinic:  historical med     Requested Prescriptions  Pending Prescriptions Disp Refills   gabapentin (NEURONTIN) 300 MG capsule [Pharmacy Med Name: GABAPENTIN 300MG  CAPSULES] 90 capsule     Sig: TAKE 1 CAPSULE BY MOUTH EVERY NIGHT AT BEDTIME      Neurology: Anticonvulsants - gabapentin Passed - 04/25/2020 12:02 PM      Passed - Valid encounter within last 12 months    Recent Outpatient Visits           1 week ago Chronic obstructive pulmonary disease, unspecified COPD type [facility])   Henefer, Kelby Aline, FNP       Future Appointments             In 1 week Agbor-Etang, Aaron Edelman, MD [facility], [facility]   In 2 months Kimberly, Kelby Aline, [place], [place]

## 2020-04-25 NOTE — Telephone Encounter (Signed)
Requested medication (s) are due for refill today: yes  Requested medication (s) are on the active medication list: yes  Last refill:  03/28/2020  Future visit scheduled: yes  Notes to clinic:  this refill cannot be delegated    Requested Prescriptions  Pending Prescriptions Disp Refills   HYDROcodone-acetaminophen (NORCO) 10-325 MG tablet 30 tablet 0    Sig: Take 1 tablet by mouth 3 (three) times daily.      There is no refill protocol information for this order

## 2020-04-25 NOTE — Telephone Encounter (Signed)
Medication Refill - Medication: Hydrocodone   Has the patient contacted their pharmacy? No. Pt states that he usually has to contact PCP for this medication. Please advise.  (Agent: If no, request that the patient contact the pharmacy for the refill.) (Agent: If yes, when and what did the pharmacy advise?)  Preferred Pharmacy (with phone number or street name):  [id number] - [address], [address]  [address] [place] [address]  Phone: [phone] Fax: [phone]  Hours: Not open 24 hours     Agent: Please be advised that RX refills may take up to 3 business days. We ask that you follow-up with your pharmacy.

## 2020-04-29 NOTE — Telephone Encounter (Signed)
Pt called stating that he only received enough of this medication to last for 10 days and that he usually gets a 30 day supply. Please advise.

## 2020-04-30 NOTE — Telephone Encounter (Signed)
Do not recommend taking it 3 times daily, if pain is out of control, recommend pain clinic.

## 2020-05-01 ENCOUNTER — Telehealth: Payer: Medicare Other | Admitting: Adult Health

## 2020-05-01 ENCOUNTER — Other Ambulatory Visit: Payer: Self-pay | Admitting: Adult Health

## 2020-05-01 MED ORDER — HYDROCODONE-ACETAMINOPHEN 5-325 MG PO TABS: 1.0000 | ORAL_TABLET | Freq: Three times a day (TID) | ORAL | 0 refills | Status: DC | PRN

## 2020-05-01 NOTE — Telephone Encounter (Signed)
Patient has been advised. KW 

## 2020-05-01 NOTE — Progress Notes (Signed)
Meds ordered this encounter  Medications  . HYDROcodone-acetaminophen (NORCO) 5-325 MG tablet    Sig: Take 1 tablet by mouth every 8 (eight) hours as needed for moderate pain.    Dispense:  90 tablet    Refill:  0    Medications Discontinued During This Encounter  Medication Reason  . HYDROcodone-acetaminophen (NORCO) 10-325 MG tablet Change in therapy  Change in therapy.   Patient agrees to decreasing dosage, advised not comfortable giving 10/325 mg tablet for chronic pain, offered pain management he declined, has been taking for years and doing ok, understands the risk, and that may cause drowsiness, and caution advised. Random drug testing in office may occur at anytime and patient is aware.

## 2020-05-05 ENCOUNTER — Other Ambulatory Visit: Payer: Self-pay

## 2020-05-05 ENCOUNTER — Encounter: Payer: Self-pay | Admitting: Cardiology

## 2020-05-05 ENCOUNTER — Ambulatory Visit: Payer: Medicare Other | Admitting: Cardiology

## 2020-05-05 VITALS — BP 128/62 | HR 56 | Ht 70.0 in | Wt 285.0 lb

## 2020-05-05 DIAGNOSIS — R0600 Dyspnea, unspecified: Secondary | ICD-10-CM

## 2020-05-05 DIAGNOSIS — E7800 Pure hypercholesterolemia, unspecified: Secondary | ICD-10-CM | POA: Diagnosis not present

## 2020-05-05 DIAGNOSIS — I2510 Atherosclerotic heart disease of native coronary artery without angina pectoris: Secondary | ICD-10-CM | POA: Diagnosis not present

## 2020-05-05 DIAGNOSIS — R0609 Other forms of dyspnea: Secondary | ICD-10-CM

## 2020-05-05 MED ORDER — REPATHA SURECLICK 140 MG/ML ~~LOC~~ SOAJ: 1.0000 "pen " | SUBCUTANEOUS | 11 refills | Status: DC

## 2020-05-05 NOTE — Progress Notes (Signed)
Cardiology Office Note:    Date:  05/05/2020   ID:  Tony Williams, DOB 1945-08-28, MRN [id number]  PCP:  Doreen Beam, [place]  Cardiologist:  No primary care provider on file.  Advanced Practice Provider:  No care team member to display Electrophysiologist:  None       Referring MD: Sharmon Leyden*   Chief Complaint  Patient presents with  . New Patient (Initial Visit)    Patient to establish care with provider for past heart history, recently moved to this area from [place]. Has had several heart surgeries in the past.  Medications verbally reviewed with patient.     History of Present Illness:    Tony Williams is a 75 y.o. male with a hx of CAD/CABG x 4 2007, PCI x 2 2018, former smoker, possible COPD, obesity diabetes who presents to establish care.  Patient recently moved to the area from [facility].  States having CAD/CABG x4 back in 2007, in 2018 had a myocardial infarction, had 2 stents placed.  He is intolerant to statins, has tried multiple statins and not able to tolerate due to myalgias.  Currently able to tolerate low-dose lovastatin.  Had sleep study in the past and was diagnosed with OSA, he declines not using his CPAP mask.  Does not like the feet for the mask.  Bruises easily with Plavix but states he will not like to come off.  Endorses shortness of breath with exertion, denies pain, palpitations, edema.  States having right lung scarring, followed up with pulmonary medicine in [facility], he was told nothing can be done for the scarring.  Recently started on inhalers per primary care provider.   Past Medical History:  Diagnosis Date  . Cancer ([place])   . Chronic kidney disease   . COPD (chronic obstructive pulmonary disease) ([place])   . Depression   . Diabetes mellitus without complication ([place])   . H/O heart artery stent   . Heart problem     Past Surgical History:  Procedure  Laterality Date  . ARTERIAL BYPASS SURGRY    . EYE SURGERY    . IR STENT PLACEMENT ANTE CAROTID INC ANGIO    . KNEE SURGERY      Current Medications: Current Meds  Medication Sig  . albuterol (VENTOLIN HFA) 108 (90 Base) MCG/ACT inhaler Inhale 2 puffs into the lungs every 6 (six) hours as needed for wheezing or shortness of breath.  . allopurinol (ZYLOPRIM) 300 MG tablet Take 300 mg by mouth daily.  Marland Kitchen amLODipine (NORVASC) 10 MG tablet Take 10 mg by mouth daily.  Marland Kitchen aspirin EC 81 MG tablet Take 81 mg by mouth daily. Swallow whole.  . blood glucose meter kit and supplies Dispense based on patient and insurance preference. Use up to four times daily as directed. (FOR ICD-10 E10.9, E11.9). check blood glucose fasting and before meals four times daily.  . Cholecalciferol (VITAMIN D3 PO) Take 4,000 Units by mouth.  . clopidogrel (PLAVIX) 75 MG tablet Take 75 mg by mouth daily.  . Coenzyme Q10 (CO Q-10) 400 MG CAPS Take by mouth.  . escitalopram (LEXAPRO) 20 MG tablet Take 20 mg by mouth daily.  . Evolocumab (REPATHA SURECLICK) 244 MG/ML SOAJ Inject 1 pen into the skin every 14 (fourteen) days.  . fluticasone-salmeterol (ADVAIR HFA) 230-21 MCG/ACT inhaler Inhale 1-2 puffs into the lungs 2 (two) times daily. Rinse mouth after each use.  . gabapentin (  NEURONTIN) 300 MG capsule TAKE 1 CAPSULE BY MOUTH EVERY NIGHT AT BEDTIME  . glimepiride (AMARYL) 1 MG tablet TAKE 1 TABLET BY MOUTH TWICE DAILY, THIS REPLACES METFORMIN  . HYDROcodone-acetaminophen (NORCO) 5-325 MG tablet Take 1 tablet by mouth every 8 (eight) hours as needed for moderate pain.  Marland Kitchen lovastatin (MEVACOR) 40 MG tablet Take 40 mg by mouth daily.  . metoprolol tartrate (LOPRESSOR) 25 MG tablet Take 25 mg by mouth daily at 6 (six) AM.  . Omega-3 Fatty Acids (FISH OIL) 1200 MG CAPS Take by mouth.  . traZODone (DESYREL) 150 MG tablet Take 150-300 mg by mouth at bedtime as needed.     Allergies:   Patient has no known allergies.   Social  History   Socioeconomic History  . Marital status: Married    Spouse name: Not on file  . Number of children: Not on file  . Years of education: Not on file  . Highest education level: Not on file  Occupational History  . Not on file  Tobacco Use  . Smoking status: Former Smoker    Quit date: 10/23/2003    Years since quitting: 16.5  . Smokeless tobacco: Never Used  Substance and Sexual Activity  . Alcohol use: Never  . Drug use: Never  . Sexual activity: Not on file  Other Topics Concern  . Not on file  Social History Narrative  . Not on file   Social Determinants of Health   Financial Resource Strain: Not on file  Food Insecurity: Not on file  Transportation Needs: Not on file  Physical Activity: Not on file  Stress: Not on file  Social Connections: Not on file     Family History: The patient's family history includes Heart Problems in his father and mother.  ROS:   Please see the history of present illness.     All other systems reviewed and are negative.  EKGs/Labs/Other Studies Reviewed:    The following studies were reviewed today:   EKG:  EKG is  ordered today.  The ekg ordered today demonstrates sinus bradycardia, heart rate 56  Recent Labs: 04/22/2020: ALT 11; BUN 23; Creatinine, Ser 1.55; Hemoglobin 15.6; Platelets 221; Potassium 5.4; Sodium 141; TSH 4.100  Recent Lipid Panel    Component Value Date/Time   CHOL 218 (H) 04/22/2020 1017   TRIG 282 (H) 04/22/2020 1017   HDL 34 (L) 04/22/2020 1017   CHOLHDL 6.4 (H) 04/22/2020 1017   LDLCALC 133 (H) 04/22/2020 1017     Risk Assessment/Calculations:      Physical Exam:    VS:  BP 128/62 (BP Location: Right Arm, Patient Position: Sitting, Cuff Size: Large)   Pulse (!) 56   Ht 5' 10"  (1.778 m)   Wt 285 lb (129.3 kg)   SpO2 91%   BMI 40.89 kg/m     Wt Readings from Last 3 Encounters:  05/05/20 285 lb (129.3 kg)  04/18/20 292 lb (132.5 kg)     GEN:  Well nourished, well developed in no  acute distress HEENT: Normal NECK: No JVD; No carotid bruits LYMPHATICS: No lymphadenopathy CARDIAC: RRR, no murmurs, rubs, gallops RESPIRATORY: Decreased breath sounds at bases, ABDOMEN: Soft, non-tender, non-distended MUSCULOSKELETAL:  No edema; No deformity  SKIN: Warm and dry NEUROLOGIC:  Alert and oriented x 3 PSYCHIATRIC:  Normal affect   ASSESSMENT:    1. Dyspnea on exertion   2. Coronary artery disease involving native heart, unspecified vessel or lesion type, unspecified whether angina present  3. Pure hypercholesterolemia    PLAN:    In order of problems listed above:  1. Dyspnea on exertion, get echocardiogram, get Lexiscan Myoview to evaluate ischemia due to risk factors.  Symptoms could well be secondary to pulmonary pathology, OSA, being morbidly obese.  Pulmonary referral regarding management of possible OSA was recommended, patient declined stating he will not want to wear CPAP mask or perform sleep study. 2. History of CABG x4, PCI x2.  Echo and Lexiscan as above.  Continue aspirin/Plavix/statin. 3. Hyperlipidemia, LDL not at goal.  Patient not tolerant to statins apart from low-dose lovastatin.  Prescribed PCSK9.  Follow-up after echo and Myoview.   Shared Decision Making/Informed Consent The risks [chest pain, shortness of breath, cardiac arrhythmias, dizziness, blood pressure fluctuations, myocardial infarction, stroke/transient ischemic attack, nausea, vomiting, allergic reaction, radiation exposure, metallic taste sensation and life-threatening complications (estimated to be 1 in 10,000)], benefits (risk stratification, diagnosing coronary artery disease, treatment guidance) and alternatives of a nuclear stress test were discussed in detail with Mr. Counsell and he agrees to proceed.       Medication Adjustments/Labs and Tests Ordered: Current medicines are reviewed at length with the patient today.  Concerns regarding medicines are outlined above.  Orders  Placed This Encounter  Procedures  . NM Myocar Multi W/Spect W/Wall Motion / EF  . EKG 12-Lead  . ECHOCARDIOGRAM COMPLETE   Meds ordered this encounter  Medications  . Evolocumab (REPATHA SURECLICK) 101 MG/ML SOAJ    Sig: Inject 1 pen into the skin every 14 (fourteen) days.    Dispense:  2 mL    Refill:  11    Patient Instructions   Medication Instructions:  Your physician has recommended you make the following change in your medication:  1- START Repatha - Inject 1 pen every 14 days.    *If you need a refill on your cardiac medications before your next appointment, please call your pharmacy*   Lab Work: none If you have labs (blood work) drawn today and your tests are completely normal, you will receive your results only by: Marland Kitchen MyChart Message (if you have MyChart) OR . A paper copy in the mail If you have any lab test that is abnormal or we need to change your treatment, we will call you to review the results.   Testing/Procedures:  1- ECHOCARDIOGRAM Your physician has requested that you have an echocardiogram. Echocardiography is a painless test that uses sound waves to create images of your heart. It provides your doctor with information about the size and shape of your heart and how well your heart's chambers and valves are working. This procedure takes approximately one hour. There are no restrictions for this procedure. There is a possibility that an IV may need to be started during your test to inject an image enhancing agent. This is done to obtain more optimal pictures of your heart. Therefore we ask that you do at least drink some water prior to coming in to hydrate your veins.    2- [place]  Your caregiver has ordered a Stress Test with nuclear imaging. The purpose of this test is to evaluate the blood supply to your heart muscle. This procedure is referred to as a "Non-Invasive Stress Test." This is because other than having an IV started  in your vein, nothing is inserted or "invades" your body. Cardiac stress tests are done to find areas of poor blood flow to the heart by determining the extent of  coronary artery disease (CAD). Some patients exercise on a treadmill, which naturally increases the blood flow to your heart, while others who are  unable to walk on a treadmill due to physical limitations have a pharmacologic/chemical stress agent called Lexiscan . This medicine will mimic walking on a treadmill by temporarily increasing your coronary blood flow.   Please note: these test may take anywhere between 2-4 hours to complete  PLEASE REPORT TO [place] AT THE FIRST DESK WILL DIRECT YOU WHERE TO GO  Date of Procedure:_____________________________________  Arrival Time for Procedure:______________________________  Instructions regarding medication:   _xx_ : Hold diabetes medication morning of procedure - [place]  PLEASE NOTIFY THE OFFICE AT LEAST 24 HOURS IN ADVANCE IF YOU ARE UNABLE TO KEEP YOUR APPOINTMENT.  [phone] AND  PLEASE NOTIFY NUCLEAR MEDICINE AT [facility] AT LEAST 24 HOURS IN ADVANCE IF YOU ARE UNABLE TO KEEP YOUR APPOINTMENT. [phone]  How to prepare for your Myoview test:  4. Do not eat or drink after midnight 5. No caffeine for 24 hours prior to test 6. No smoking 24 hours prior to test. 7. Your medication may be taken with water.  If your doctor stopped a medication because of this test, do not take that medication. 8. Ladies, please do not wear dresses.  Skirts or pants are appropriate. Please wear a short sleeve shirt. 9. No perfume, cologne or lotion. Wear comfortable walking shoes.  Follow-Up: At [facility], you and your health needs are our priority.  As part of our continuing mission to provide you with exceptional heart care, we have created designated Provider Care Teams.  These Care Teams include your primary Cardiologist (physician) and Advanced  Practice Providers (APPs -  Physician Assistants and Nurse Practitioners) who all work together to provide you with the care you need, when you need it.  We recommend signing up for the patient portal called "MyChart".  Sign up information is provided on this After Visit Summary.  MyChart is used to connect with patients for Virtual Visits (Telemedicine).  Patients are able to view lab/test results, encounter notes, upcoming appointments, etc.  Non-urgent messages can be sent to your provider as well.   To learn more about what you can do with MyChart, go to [email or web address].    Your next appointment:   After testing.   The format for your next appointment:   In Person  Provider:   You may see Dr. Garen Lah or one of the following Advanced Practice Providers on your designated Care Team:    Murray Hodgkins, NP  Christell Faith, PA-C  Marrianne Mood, PA-C  Cadence Kathlen Mody, [place]  Laurann Montana, NP    Cardiac Nuclear Scan A cardiac nuclear scan is a test that measures blood flow to the heart when a person is resting and when he or she is exercising. The test looks for problems such as:  Not enough blood reaching a portion of the heart.  The heart muscle not working normally. You may need this test if:  You have heart disease.  You have had abnormal lab results.  You have had heart surgery or a balloon procedure to open up blocked arteries (angioplasty).  You have chest pain.  You have shortness of breath. In this test, a radioactive dye (tracer) is injected into your bloodstream. After the tracer has traveled to your heart, an imaging device is used to measure how much of the tracer is absorbed by or distributed to various areas  of your heart. This procedure is usually done at a hospital and takes 2-4 hours. Tell a health care provider about:  Any allergies you have.  All medicines you are taking, including vitamins, herbs, eye drops, creams, and over-the-counter  medicines.  Any problems you or family members have had with anesthetic medicines.  Any blood disorders you have.  Any surgeries you have had.  Any medical conditions you have.  Whether you are pregnant or may be pregnant. What are the risks? Generally, this is a safe procedure. However, problems may occur, including:  Serious chest pain and heart attack. This is only a risk if the stress portion of the test is done.  Rapid heartbeat.  Sensation of warmth in your chest. This usually passes quickly.  Allergic reaction to the tracer. What happens before the procedure?  Ask your health care provider about changing or stopping your regular medicines. This is especially important if you are taking diabetes medicines or blood thinners.  Follow instructions from your health care provider about eating or drinking restrictions.  Remove your jewelry on the day of the procedure. What happens during the procedure?  An IV will be inserted into one of your veins.  Your health care provider will inject a small amount of radioactive tracer through the IV.  You will wait for 20-40 minutes while the tracer travels through your bloodstream.  Your heart activity will be monitored with an electrocardiogram (ECG).  You will lie down on an exam table.  Images of your heart will be taken for about 15-20 minutes.  You may also have a stress test. For this test, one of the following may be done: ? You will exercise on a treadmill or stationary bike. While you exercise, your heart's activity will be monitored with an ECG, and your blood pressure will be checked. ? You will be given medicines that will increase blood flow to parts of your heart. This is done if you are unable to exercise.  When blood flow to your heart has peaked, a tracer will again be injected through the IV.  After 20-40 minutes, you will get back on the exam table and have more images taken of your heart.  Depending on the  type of tracer used, scans may need to be repeated 3-4 hours later.  Your IV line will be removed when the procedure is over. The procedure may vary among health care providers and hospitals. What happens after the procedure?  Unless your health care provider tells you otherwise, you may return to your normal schedule, including diet, activities, and medicines.  Unless your health care provider tells you otherwise, you may increase your fluid intake. This will help to flush the contrast dye from your body. Drink enough fluid to keep your urine pale yellow.  Ask your health care provider, or the department that is doing the test: ? When will my results be ready? ? How will I get my results? Summary  A cardiac nuclear scan measures the blood flow to the heart when a person is resting and when he or she is exercising.  Tell your health care provider if you are pregnant.  Before the procedure, ask your health care provider about changing or stopping your regular medicines. This is especially important if you are taking diabetes medicines or blood thinners.  After the procedure, unless your health care provider tells you otherwise, increase your fluid intake. This will help flush the contrast dye from your body.  After  the procedure, unless your health care provider tells you otherwise, you may return to your normal schedule, including diet, activities, and medicines. This information is not intended to replace advice given to you by your health care provider. Make sure you discuss any questions you have with your health care provider. Document Revised: 08/15/2017 Document Reviewed: 08/15/2017 Elsevier Patient Education  2021 [place].    Echocardiogram An echocardiogram is a test that uses sound waves (ultrasound) to produce images of the heart. Images from an echocardiogram can provide important information about:  Heart size and shape.  The size and thickness and movement of  your heart's walls.  Heart muscle function and strength.  Heart valve function or if you have stenosis. Stenosis is when the heart valves are too narrow.  If blood is flowing backward through the heart valves (regurgitation).  A tumor or infectious growth around the heart valves.  Areas of heart muscle that are not working well because of poor blood flow or injury from a heart attack.  Aneurysm detection. An aneurysm is a weak or damaged part of an artery wall. The wall bulges out from the normal force of blood pumping through the body. Tell a health care provider about:  Any allergies you have.  All medicines you are taking, including vitamins, herbs, eye drops, creams, and over-the-counter medicines.  Any blood disorders you have.  Any surgeries you have had.  Any medical conditions you have.  Whether you are pregnant or may be pregnant. What are the risks? Generally, this is a safe test. However, problems may occur, including an allergic reaction to dye (contrast) that may be used during the test. What happens before the test? No specific preparation is needed. You may eat and drink normally. What happens during the test?  You will take off your clothes from the waist up and put on a hospital gown.  Electrodes or electrocardiogram (ECG)patches may be placed on your chest. The electrodes or patches are then connected to a device that monitors your heart rate and rhythm.  You will lie down on a table for an ultrasound exam. A gel will be applied to your chest to help sound waves pass through your skin.  A handheld device, called a transducer, will be pressed against your chest and moved over your heart. The transducer produces sound waves that travel to your heart and bounce back (or "echo" back) to the transducer. These sound waves will be captured in real-time and changed into images of your heart that can be viewed on a video monitor. The images will be recorded on a  computer and reviewed by your health care provider.  You may be asked to change positions or hold your breath for a short time. This makes it easier to get different views or better views of your heart.  In some cases, you may receive contrast through an IV in one of your veins. This can improve the quality of the pictures from your heart. The procedure may vary among health care providers and hospitals.   What can I expect after the test? You may return to your normal, everyday life, including diet, activities, and medicines, unless your health care provider tells you not to do that. Follow these instructions at home:  It is up to you to get the results of your test. Ask your health care provider, or the department that is doing the test, when your results will be ready.  Keep all follow-up visits. This is  important. Summary  An echocardiogram is a test that uses sound waves (ultrasound) to produce images of the heart.  Images from an echocardiogram can provide important information about the size and shape of your heart, heart muscle function, heart valve function, and other possible heart problems.  You do not need to do anything to prepare before this test. You may eat and drink normally.  After the echocardiogram is completed, you may return to your normal, everyday life, unless your health care provider tells you not to do that. This information is not intended to replace advice given to you by your health care provider. Make sure you discuss any questions you have with your health care provider. Document Revised: 10/23/2019 Document Reviewed: 10/23/2019 Elsevier Patient Education  2021 [place].     Signed, Kate Sable, MD  05/05/2020 4:55 PM    [place]

## 2020-05-05 NOTE — Patient Instructions (Addendum)
Medication Instructions:  Your physician has recommended you make the following change in your medication:  1- START Repatha - Inject 1 pen every 14 days.    *If you need a refill on your cardiac medications before your next appointment, please call your pharmacy*   Lab Work: none If you have labs (blood work) drawn today and your tests are completely normal, you will receive your results only by: Marland Kitchen MyChart Message (if you have MyChart) OR . A paper copy in the mail If you have any lab test that is abnormal or we need to change your treatment, we will call you to review the results.   Testing/Procedures:  1- ECHOCARDIOGRAM Your physician has requested that you have an echocardiogram. Echocardiography is a painless test that uses sound waves to create images of your heart. It provides your doctor with information about the size and shape of your heart and how well your heart's chambers and valves are working. This procedure takes approximately one hour. There are no restrictions for this procedure. There is a possibility that an IV may need to be started during your test to inject an image enhancing agent. This is done to obtain more optimal pictures of your heart. Therefore we ask that you do at least drink some water prior to coming in to hydrate your veins.    2- Brandt  Your caregiver has ordered a Stress Test with nuclear imaging. The purpose of this test is to evaluate the blood supply to your heart muscle. This procedure is referred to as a "Non-Invasive Stress Test." This is because other than having an IV started in your vein, nothing is inserted or "invades" your body. Cardiac stress tests are done to find areas of poor blood flow to the heart by determining the extent of coronary artery disease (CAD). Some patients exercise on a treadmill, which naturally increases the blood flow to your heart, while others who are  unable to walk on a treadmill due to  physical limitations have a pharmacologic/chemical stress agent called Lexiscan . This medicine will mimic walking on a treadmill by temporarily increasing your coronary blood flow.   Please note: these test may take anywhere between 2-4 hours to complete  PLEASE REPORT TO [place] AT THE FIRST DESK WILL DIRECT YOU WHERE TO GO  Date of Procedure:_____________________________________  Arrival Time for Procedure:______________________________  Instructions regarding medication:   _xx_ : Hold diabetes medication morning of procedure - Amaryl  PLEASE NOTIFY THE OFFICE AT LEAST 24 HOURS IN ADVANCE IF YOU ARE UNABLE TO KEEP YOUR APPOINTMENT.  [phone] AND  PLEASE NOTIFY NUCLEAR MEDICINE AT [facility] AT LEAST 24 HOURS IN ADVANCE IF YOU ARE UNABLE TO KEEP YOUR APPOINTMENT. [phone]  How to prepare for your Myoview test:  1. Do not eat or drink after midnight 2. No caffeine for 24 hours prior to test 3. No smoking 24 hours prior to test. 4. Your medication may be taken with water.  If your doctor stopped a medication because of this test, do not take that medication. 5. Ladies, please do not wear dresses.  Skirts or pants are appropriate. Please wear a short sleeve shirt. 6. No perfume, cologne or lotion. Wear comfortable walking shoes.  Follow-Up: At [facility], you and your health needs are our priority.  As part of our continuing mission to provide you with exceptional heart care, we have created designated Provider Care Teams.  These Care Teams  include your primary Cardiologist (physician) and Advanced Practice Providers (APPs -  Physician Assistants and Nurse Practitioners) who all work together to provide you with the care you need, when you need it.  We recommend signing up for the patient portal called "MyChart".  Sign up information is provided on this After Visit Summary.  MyChart is used to connect with patients for Virtual Visits  (Telemedicine).  Patients are able to view lab/test results, encounter notes, upcoming appointments, etc.  Non-urgent messages can be sent to your provider as well.   To learn more about what you can do with MyChart, go to [email or web address].    Your next appointment:   After testing.   The format for your next appointment:   In Person  Provider:   You may see Dr. Garen Lah or one of the following Advanced Practice Providers on your designated Care Team:    Murray Hodgkins, NP  Christell Faith, PA-C  Marrianne Mood, PA-C  Cadence Kathlen Mody, [place]  Laurann Montana, NP    Cardiac Nuclear Scan A cardiac nuclear scan is a test that measures blood flow to the heart when a person is resting and when he or she is exercising. The test looks for problems such as:  Not enough blood reaching a portion of the heart.  The heart muscle not working normally. You may need this test if:  You have heart disease.  You have had abnormal lab results.  You have had heart surgery or a balloon procedure to open up blocked arteries (angioplasty).  You have chest pain.  You have shortness of breath. In this test, a radioactive dye (tracer) is injected into your bloodstream. After the tracer has traveled to your heart, an imaging device is used to measure how much of the tracer is absorbed by or distributed to various areas of your heart. This procedure is usually done at a hospital and takes 2-4 hours. Tell a health care provider about:  Any allergies you have.  All medicines you are taking, including vitamins, herbs, eye drops, creams, and over-the-counter medicines.  Any problems you or family members have had with anesthetic medicines.  Any blood disorders you have.  Any surgeries you have had.  Any medical conditions you have.  Whether you are pregnant or may be pregnant. What are the risks? Generally, this is a safe procedure. However, problems may occur, including:  Serious  chest pain and heart attack. This is only a risk if the stress portion of the test is done.  Rapid heartbeat.  Sensation of warmth in your chest. This usually passes quickly.  Allergic reaction to the tracer. What happens before the procedure?  Ask your health care provider about changing or stopping your regular medicines. This is especially important if you are taking diabetes medicines or blood thinners.  Follow instructions from your health care provider about eating or drinking restrictions.  Remove your jewelry on the day of the procedure. What happens during the procedure?  An IV will be inserted into one of your veins.  Your health care provider will inject a small amount of radioactive tracer through the IV.  You will wait for 20-40 minutes while the tracer travels through your bloodstream.  Your heart activity will be monitored with an electrocardiogram (ECG).  You will lie down on an exam table.  Images of your heart will be taken for about 15-20 minutes.  You may also have a stress test. For this test, one of the following may be  done: ? You will exercise on a treadmill or stationary bike. While you exercise, your heart's activity will be monitored with an ECG, and your blood pressure will be checked. ? You will be given medicines that will increase blood flow to parts of your heart. This is done if you are unable to exercise.  When blood flow to your heart has peaked, a tracer will again be injected through the IV.  After 20-40 minutes, you will get back on the exam table and have more images taken of your heart.  Depending on the type of tracer used, scans may need to be repeated 3-4 hours later.  Your IV line will be removed when the procedure is over. The procedure may vary among health care providers and hospitals. What happens after the procedure?  Unless your health care provider tells you otherwise, you may return to your normal schedule, including diet,  activities, and medicines.  Unless your health care provider tells you otherwise, you may increase your fluid intake. This will help to flush the contrast dye from your body. Drink enough fluid to keep your urine pale yellow.  Ask your health care provider, or the department that is doing the test: ? When will my results be ready? ? How will I get my results? Summary  A cardiac nuclear scan measures the blood flow to the heart when a person is resting and when he or she is exercising.  Tell your health care provider if you are pregnant.  Before the procedure, ask your health care provider about changing or stopping your regular medicines. This is especially important if you are taking diabetes medicines or blood thinners.  After the procedure, unless your health care provider tells you otherwise, increase your fluid intake. This will help flush the contrast dye from your body.  After the procedure, unless your health care provider tells you otherwise, you may return to your normal schedule, including diet, activities, and medicines. This information is not intended to replace advice given to you by your health care provider. Make sure you discuss any questions you have with your health care provider. Document Revised: 08/15/2017 Document Reviewed: 08/15/2017 Elsevier Patient Education  2021 [place].    Echocardiogram An echocardiogram is a test that uses sound waves (ultrasound) to produce images of the heart. Images from an echocardiogram can provide important information about:  Heart size and shape.  The size and thickness and movement of your heart's walls.  Heart muscle function and strength.  Heart valve function or if you have stenosis. Stenosis is when the heart valves are too narrow.  If blood is flowing backward through the heart valves (regurgitation).  A tumor or infectious growth around the heart valves.  Areas of heart muscle that are not working well  because of poor blood flow or injury from a heart attack.  Aneurysm detection. An aneurysm is a weak or damaged part of an artery wall. The wall bulges out from the normal force of blood pumping through the body. Tell a health care provider about:  Any allergies you have.  All medicines you are taking, including vitamins, herbs, eye drops, creams, and over-the-counter medicines.  Any blood disorders you have.  Any surgeries you have had.  Any medical conditions you have.  Whether you are pregnant or may be pregnant. What are the risks? Generally, this is a safe test. However, problems may occur, including an allergic reaction to dye (contrast) that may be used during the test.  What happens before the test? No specific preparation is needed. You may eat and drink normally. What happens during the test?  You will take off your clothes from the waist up and put on a hospital gown.  Electrodes or electrocardiogram (ECG)patches may be placed on your chest. The electrodes or patches are then connected to a device that monitors your heart rate and rhythm.  You will lie down on a table for an ultrasound exam. A gel will be applied to your chest to help sound waves pass through your skin.  A handheld device, called a transducer, will be pressed against your chest and moved over your heart. The transducer produces sound waves that travel to your heart and bounce back (or "echo" back) to the transducer. These sound waves will be captured in real-time and changed into images of your heart that can be viewed on a video monitor. The images will be recorded on a computer and reviewed by your health care provider.  You may be asked to change positions or hold your breath for a short time. This makes it easier to get different views or better views of your heart.  In some cases, you may receive contrast through an IV in one of your veins. This can improve the quality of the pictures from your  heart. The procedure may vary among health care providers and hospitals.   What can I expect after the test? You may return to your normal, everyday life, including diet, activities, and medicines, unless your health care provider tells you not to do that. Follow these instructions at home:  It is up to you to get the results of your test. Ask your health care provider, or the department that is doing the test, when your results will be ready.  Keep all follow-up visits. This is important. Summary  An echocardiogram is a test that uses sound waves (ultrasound) to produce images of the heart.  Images from an echocardiogram can provide important information about the size and shape of your heart, heart muscle function, heart valve function, and other possible heart problems.  You do not need to do anything to prepare before this test. You may eat and drink normally.  After the echocardiogram is completed, you may return to your normal, everyday life, unless your health care provider tells you not to do that. This information is not intended to replace advice given to you by your health care provider. Make sure you discuss any questions you have with your health care provider. Document Revised: 10/23/2019 Document Reviewed: 10/23/2019 Elsevier Patient Education  2021 [employer].

## 2020-05-06 ENCOUNTER — Ambulatory Visit (INDEPENDENT_AMBULATORY_CARE_PROVIDER_SITE_OTHER): Payer: Medicare Other | Admitting: Physician Assistant

## 2020-05-06 DIAGNOSIS — J0110 Acute frontal sinusitis, unspecified: Secondary | ICD-10-CM

## 2020-05-06 DIAGNOSIS — R0981 Nasal congestion: Secondary | ICD-10-CM

## 2020-05-06 MED ORDER — AMOXICILLIN-POT CLAVULANATE 875-125 MG PO TABS: 1.0000 | ORAL_TABLET | Freq: Two times a day (BID) | ORAL | 0 refills | Status: AC

## 2020-05-06 NOTE — Progress Notes (Addendum)
Virtual telephone visit    Virtual Visit via Telephone Note   This visit type was conducted due to national recommendations for restrictions regarding the COVID-19 Pandemic (e.g. social distancing) in an effort to limit this patient's exposure and mitigate transmission in our community. Due to his co-morbid illnesses, this patient is at least at moderate risk for complications without adequate follow up. This format is felt to be most appropriate for this patient at this time. The patient did not have access to video technology or had technical difficulties with video requiring transitioning to audio format only (telephone). Physical exam was limited to content and character of the telephone converstion.    Patient location: Home Provider location: Office  I discussed the limitations of evaluation and management by telemedicine and the availability of in person appointments. The patient expressed understanding and agreed to proceed.   Visit Date: 05/06/2020  Today's healthcare provider: Trinna Post, PA-C   Chief Complaint  Patient presents with  . Sinus Problem   Subjective    Sinus Problem This is a recurrent problem. The current episode started 1 to 4 weeks ago. The problem has been gradually worsening since onset. There has been no fever. His pain is at a severity of 4/10. The pain is mild. Associated symptoms include ear pain, headaches and sinus pressure. Pertinent negatives include no chills, congestion, coughing, hoarse voice, shortness of breath, sneezing or sore throat.    Reports he has sinus congestion, ear pain, and right sided facial pain ongoing for 2-3 weeks. It has been worsening over this time. He has tried Aspirin. Says he gets this every year.       Medications: Outpatient Medications Prior to Visit  Medication Sig  . albuterol (VENTOLIN HFA) 108 (90 Base) MCG/ACT inhaler Inhale 2 puffs into the lungs every 6 (six) hours as needed for wheezing or  shortness of breath.  . allopurinol (ZYLOPRIM) 300 MG tablet Take 300 mg by mouth daily.  Marland Kitchen amLODipine (NORVASC) 10 MG tablet Take 10 mg by mouth daily.  Marland Kitchen aspirin EC 81 MG tablet Take 81 mg by mouth daily. Swallow whole.  . blood glucose meter kit and supplies Dispense based on patient and insurance preference. Use up to four times daily as directed. (FOR ICD-10 E10.9, E11.9). check blood glucose fasting and before meals four times daily.  . Cholecalciferol (VITAMIN D3 PO) Take 4,000 Units by mouth.  . clopidogrel (PLAVIX) 75 MG tablet Take 75 mg by mouth daily.  . Coenzyme Q10 (CO Q-10) 400 MG CAPS Take by mouth.  . escitalopram (LEXAPRO) 20 MG tablet Take 20 mg by mouth daily.  . Evolocumab (REPATHA SURECLICK) 060 MG/ML SOAJ Inject 1 pen into the skin every 14 (fourteen) days.  . fluticasone-salmeterol (ADVAIR HFA) 230-21 MCG/ACT inhaler Inhale 1-2 puffs into the lungs 2 (two) times daily. Rinse mouth after each use.  . gabapentin (NEURONTIN) 300 MG capsule TAKE 1 CAPSULE BY MOUTH EVERY NIGHT AT BEDTIME  . glimepiride (AMARYL) 1 MG tablet TAKE 1 TABLET BY MOUTH TWICE DAILY, THIS REPLACES METFORMIN  . HYDROcodone-acetaminophen (NORCO) 5-325 MG tablet Take 1 tablet by mouth every 8 (eight) hours as needed for moderate pain.  Marland Kitchen lovastatin (MEVACOR) 40 MG tablet Take 40 mg by mouth daily.  . metoprolol tartrate (LOPRESSOR) 25 MG tablet Take 25 mg by mouth daily at 6 (six) AM.  . Omega-3 Fatty Acids (FISH OIL) 1200 MG CAPS Take by mouth.  . traZODone (DESYREL) 150 MG tablet Take  150-300 mg by mouth at bedtime as needed.   No facility-administered medications prior to visit.    Review of Systems  Constitutional: Negative for appetite change, chills, fatigue and fever.  HENT: Positive for ear pain, sinus pressure and sinus pain. Negative for congestion, hoarse voice, postnasal drip, rhinorrhea, sneezing and sore throat.   Respiratory: Negative for cough, chest tightness, shortness of breath and  wheezing.   Gastrointestinal: Negative for diarrhea, nausea and vomiting.  Neurological: Positive for headaches.      Objective    There were no vitals taken for this visit.     Assessment & Plan    1. Sinus congestion   2. Acute non-recurrent frontal sinusitis  Recommend 2nd generation antihistamine to prevent this.   - amoxicillin-clavulanate (AUGMENTIN) 875-125 MG tablet; Take 1 tablet by mouth 2 (two) times daily for 7 days.  Dispense: 14 tablet; Refill: 0   No follow-ups on file.    I discussed the assessment and treatment plan with the patient. The patient was provided an opportunity to ask questions and all were answered. The patient agreed with the plan and demonstrated an understanding of the instructions.   The patient was advised to call back or seek an in-person evaluation if the symptoms worsen or if the condition fails to improve as anticipated.  I provided 21 minutes of non-face-to-face time during this encounter.  ITrinna Post, PA-C, have reviewed all documentation for this visit. The documentation on 05/06/20 for the exam, diagnosis, procedures, and orders are all accurate and complete.  The entirety of the information documented in the History of Present Illness, Review of Systems and Physical Exam were personally obtained by me. Portions of this information were initially documented by [facility] and reviewed by me for thoroughness and accuracy.    Paulene Floor [facility] [phone] (phone) [phone] (fax)  [place]

## 2020-05-06 NOTE — Patient Instructions (Signed)

## 2020-05-07 ENCOUNTER — Telehealth: Payer: Self-pay | Admitting: *Deleted

## 2020-05-07 NOTE — Telephone Encounter (Signed)
Pt requiring PA Repatha 140 mg/ml. PA has been submitted through covermymeds. Awaiting approval.  Your information has been submitted to [employer]. [employer] [place] will review the request and notify you of the determination decision directly, typically within 3 business days of your submission and once all necessary information is received.  You will also receive your request decision electronically. To check for an update later, open the request again from your dashboard.  If [employer] [place] has not responded within the specified timeframe or if you have any questions about your PA submission, contact [facility] [place] directly at [facility]) [phone] or ([place]) [phone].

## 2020-05-12 NOTE — Progress Notes (Signed)
Subjective:   Tony Williams is a 75 y.o. male who presents for an Initial Medicare Annual Wellness Visit.  I connected with Tony Williams today by telephone and verified that I am speaking with the correct person using two identifiers. Location patient: home Location provider: work Persons participating in the virtual visit: patient, provider.   I discussed the limitations, risks, security and privacy concerns of performing an evaluation and management service by telephone and the availability of in person appointments. I also discussed with the patient that there may be a patient responsible charge related to this service. The patient expressed understanding and verbally consented to this telephonic visit.    Interactive audio and video telecommunications were attempted between this provider and patient, however failed, due to patient having technical difficulties OR patient did not have access to video capability.  We continued and completed visit with audio only.   Review of Systems    N/A  Cardiac Risk Factors include: advanced age (>5mn, >>42women);diabetes mellitus;dyslipidemia;male gender;hypertension;obesity (BMI >30kg/m2)     Objective:    There were no vitals filed for this visit. There is no height or weight on file to calculate BMI.  Advanced Directives 05/13/2020  Does Patient Have a Medical Advance Directive? Yes  Type of AParamedicof [place]a [place] will  Copy of [place]in Chart? No - copy requested    Current Medications (verified) Outpatient Encounter Medications as of 05/13/2020  Medication Sig  . albuterol (VENTOLIN HFA) 108 (90 Base) MCG/ACT inhaler Inhale 2 puffs into the lungs every 6 (six) hours as needed for wheezing or shortness of breath.  . allopurinol (ZYLOPRIM) 300 MG tablet Take 300 mg by mouth daily.  .Marland KitchenamLODipine (NORVASC) 10 MG tablet Take 10 mg by mouth daily.  .Marland Kitchenaspirin EC 81 MG tablet Take  81 mg by mouth daily. Swallow whole.  . blood glucose meter kit and supplies Dispense based on patient and insurance preference. Use up to four times daily as directed. (FOR ICD-10 E10.9, E11.9). check blood glucose fasting and before meals four times daily.  . Cholecalciferol (VITAMIN D3 PO) Take 4,000 Units by mouth.  . clopidogrel (PLAVIX) 75 MG tablet Take 75 mg by mouth daily.  . Coenzyme Q10 (CO Q-10) 400 MG CAPS Take by mouth daily at 6 (six) AM.  . escitalopram (LEXAPRO) 20 MG tablet Take 20 mg by mouth daily.  . Evolocumab (REPATHA SURECLICK) 1852MG/ML SOAJ Inject 1 pen into the skin every 14 (fourteen) days.  . fluticasone-salmeterol (ADVAIR HFA) 230-21 MCG/ACT inhaler Inhale 1-2 puffs into the lungs 2 (two) times daily. Rinse mouth after each use.  . gabapentin (NEURONTIN) 300 MG capsule TAKE 1 CAPSULE BY MOUTH EVERY NIGHT AT BEDTIME  . glimepiride (AMARYL) 1 MG tablet TAKE 1 TABLET BY MOUTH TWICE DAILY, THIS REPLACES METFORMIN  . HYDROcodone-acetaminophen (NORCO) 5-325 MG tablet Take 1 tablet by mouth every 8 (eight) hours as needed for moderate pain.  .Marland Kitchenlovastatin (MEVACOR) 40 MG tablet Take 40 mg by mouth daily.  . metoprolol tartrate (LOPRESSOR) 25 MG tablet Take 25 mg by mouth daily at 6 (six) AM.  . Omega-3 Fatty Acids (FISH OIL) 1200 MG CAPS Take by mouth daily at 6 (six) AM.  . traZODone (DESYREL) 150 MG tablet Take 150-300 mg by mouth at bedtime as needed.  .Marland Kitchenamoxicillin-clavulanate (AUGMENTIN) 875-125 MG tablet Take 1 tablet by mouth 2 (two) times daily for 7 days. (Patient not taking: Reported on 05/13/2020)  No facility-administered encounter medications on file as of 05/13/2020.    Allergies (verified) Stadol [butorphanol]   History: Past Medical History:  Diagnosis Date  . Cancer ([place])   . Chronic kidney disease   . COPD (chronic obstructive pulmonary disease) ([place])   . Depression   . Diabetes mellitus without complication ([place])   . H/O heart artery stent   .  Heart problem    Past Surgical History:  Procedure Laterality Date  . ARTERIAL BYPASS SURGRY    . EYE SURGERY    . IR STENT PLACEMENT ANTE CAROTID INC ANGIO    . KNEE SURGERY     Family History  Problem Relation Age of Onset  . Heart Problems Mother   . Heart Problems Father    Social History   Socioeconomic History  . Marital status: Married    Spouse name: Not on file  . Number of children: 2  . Years of education: Not on file  . Highest education level: High school graduate  Occupational History  . Occupation: retired  Tobacco Use  . Smoking status: Former Smoker    Quit date: 10/23/2003    Years since quitting: 16.5  . Smokeless tobacco: Never Used  Vaping Use  . Vaping Use: Never used  Substance and Sexual Activity  . Alcohol use: Never  . Drug use: Never  . Sexual activity: Not on file  Other Topics Concern  . Not on file  Social History Narrative  . Not on file   Social Determinants of Health   Financial Resource Strain: Low Risk   . Difficulty of Paying Living Expenses: Not hard at all  Food Insecurity: No Food Insecurity  . Worried About Charity fundraiser in the Last Year: Never true  . Ran Out of Food in the Last Year: Never true  Transportation Needs: No Transportation Needs  . Lack of Transportation (Medical): No  . Lack of Transportation (Non-Medical): No  Physical Activity: Inactive  . Days of Exercise per Week: 0 days  . Minutes of Exercise per Session: 0 min  Stress: No Stress Concern Present  . Feeling of Stress : Not at all  Social Connections: Moderately Isolated  . Frequency of Communication with Friends and Family: More than three times a week  . Frequency of Social Gatherings with Friends and Family: Three times a week  . Attends Religious Services: Never  . Active Member of Clubs or Organizations: No  . Attends Archivist Meetings: Never  . Marital Status: Married    Tobacco Counseling Counseling given: Not  Answered   Clinical Intake:  Pre-visit preparation completed: Yes  Pain : No/denies pain     Nutritional Risks: None Diabetes: Yes  How often do you need to have someone help you when you read instructions, pamphlets, or other written materials from your doctor or pharmacy?: 1 - Never  Diabetic? Yes  Nutrition Risk Assessment:  Has the patient had any N/V/D within the last 2 months?  No  Does the patient have any non-healing wounds?  No  Has the patient had any unintentional weight loss or weight gain?  No   Diabetes:  Is the patient diabetic?  Yes  If diabetic, was a CBG obtained today?  No  Did the patient bring in their glucometer from home?  No  How often do you monitor your CBG's? Once a week.   Financial Strains and Diabetes Management:  Are you having any financial strains with the device,  your supplies or your medication? No .  Does the patient want to be seen by Chronic Care Management for management of their diabetes?  No  Would the patient like to be referred to a Nutritionist or for Diabetic Management?  No   Diabetic Exams:  Diabetic Eye Exam: Overdue for diabetic eye exam. Pt has been advised about the importance in completing this exam. Patient advised to call and schedule an eye exam. Diabetic Foot Exam: Overdue, Pt has been advised about the importance in completing this exam.    Interpreter Needed?: No  Information entered by :: [facility], LPN   Activities of Daily Living In your present state of health, do you have any difficulty performing the following activities: 05/13/2020  Hearing? N  Vision? N  Difficulty concentrating or making decisions? N  Walking or climbing stairs? Y  Comment Due to SOB  Dressing or bathing? N  Doing errands, shopping? N  Preparing Food and eating ? N  Using the Toilet? N  In the past six months, have you accidently leaked urine? N  Do you have problems with loss of bowel control? N  Managing your Medications? N   Managing your Finances? N  Housekeeping or managing your Housekeeping? N    Patient Care Team: Flinchum, Kelby Aline, FNP as PCP - General (Family Medicine) Kate Sable, MD as Consulting Physician (Cardiology)  Indicate any recent Medical Services you may have received from other than [facility] providers in the past year (date may be approximate).     Assessment:   This is a routine wellness examination for Tony Williams.  Hearing/Vision screen No exam data present  Dietary issues and exercise activities discussed: Current Exercise Habits: The patient does not participate in regular exercise at present, Exercise limited by: respiratory conditions(s)  Goals    . DIET - INCREASE WATER INTAKE     Recommend to drink at least 6-8 8oz glasses of water per day.      Depression Screen PHQ 2/9 Scores 05/13/2020 04/18/2020  PHQ - 2 Score 0 -  Exception Documentation - Patient refusal    Fall Risk Fall Risk  05/13/2020  Falls in the past year? 0  Number falls in past yr: 0  Injury with Fall? 0    FALL RISK PREVENTION PERTAINING TO THE HOME:  Any stairs in or around the home? Yes  If so, are there any without handrails? No  Home free of loose throw rugs in walkways, pet beds, electrical cords, etc? Yes  Adequate lighting in your home to reduce risk of falls? Yes   ASSISTIVE DEVICES UTILIZED TO PREVENT FALLS:  Life alert? No  Use of a cane, walker or w/c? No  Grab bars in the bathroom? Yes  Shower chair or bench in shower? Yes  Elevated toilet seat or a handicapped toilet? No    Cognitive Function: Normal cognitive status assessed by observation by this Nurse Health Advisor. No abnormalities found.          Immunizations Immunization History  Administered Date(s) Administered  . Influenza, High Dose Seasonal PF 01/13/2020  . Moderna Sars-Covid-2 Vaccination 04/05/2019, 05/03/2019, 01/10/2020  . Pneumococcal Conjugate-13 10/10/2014    TDAP status: Due, Education has been  provided regarding the importance of this vaccine. Advised may receive this vaccine at local pharmacy or Health Dept. Aware to provide a copy of the vaccination record if obtained from local pharmacy or Health Dept. Verbalized acceptance and understanding.  Flu Vaccine status: Up to date  Pneumococcal  vaccine status: Due, Education has been provided regarding the importance of this vaccine. Advised may receive this vaccine at local pharmacy or Health Dept. Aware to provide a copy of the vaccination record if obtained from local pharmacy or Health Dept. Verbalized acceptance and understanding.  Covid-19 vaccine status: Completed vaccines  Qualifies for Shingles Vaccine? Yes   Zostavax completed No   Shingrix Completed?: No.    Education has been provided regarding the importance of this vaccine. Patient has been advised to call insurance company to determine out of pocket expense if they have not yet received this vaccine. Advised may also receive vaccine at local pharmacy or Health Dept. Verbalized acceptance and understanding.  Screening Tests Health Maintenance  Topic Date Due  . Hepatitis C Screening  Never done  . FOOT EXAM  Never done  . OPHTHALMOLOGY EXAM  Never done  . URINE MICROALBUMIN  Never done  . PNA vac Low Risk Adult (2 of 2 - PPSV23) 10/10/2015  . COLONOSCOPY (Pts 45-11yr Insurance coverage will need to be confirmed)  07/11/2018  . TETANUS/TDAP  05/13/2021 (Originally 08/12/1964)  . HEMOGLOBIN A1C  10/20/2020  . INFLUENZA VACCINE  Completed  . COVID-19 Vaccine  Completed  . HPV VACCINES  Aged Out    Health Maintenance  Health Maintenance Due  Topic Date Due  . Hepatitis C Screening  Never done  . FOOT EXAM  Never done  . OPHTHALMOLOGY EXAM  Never done  . URINE MICROALBUMIN  Never done  . PNA vac Low Risk Adult (2 of 2 - PPSV23) 10/10/2015  . COLONOSCOPY (Pts 45-470yrInsurance coverage will need to be confirmed)  07/11/2018    Colorectal cancer screening:  Currently due, declined colonoscpy referral or cologuard order at this time.   Lung Cancer Screening: (Low Dose CT Chest recommended if Age 566-80ears, 30 pack-year currently smoking OR have quit w/in 15years.) does not qualify.   Additional Screening:  Hepatitis C Screening: does qualify however declined order at this time.   Vision Screening: Recommended annual ophthalmology exams for early detection of glaucoma and other disorders of the eye. Is the patient up to date with their annual eye exam? No Who is the provider or what is the name of the office in which the patient attends annual eye exams? [place] provider, would they like to be referred to a provider to establish care? No .   Dental Screening: Recommended annual dental exams for proper oral hygiene  Community Resource Referral / Chronic Care Management: CRR required this visit?  No   CCM required this visit?  No      Plan:     I have personally reviewed and noted the following in the patient's chart:   . Medical and social history . Use of alcohol, tobacco or illicit drugs  . Current medications and supplements . Functional ability and status . Nutritional status . Physical activity . Advanced directives . List of other physicians . Hospitalizations, surgeries, and ER visits in previous 12 months . Vitals . Screenings to include cognitive, depression, and falls . Referrals and appointments  In addition, I have reviewed and discussed with patient certain preventive protocols, quality metrics, and best practice recommendations. A written personalized care plan for preventive services as well as general preventive health recommendations were provided to patient.     Takita Riecke [place] 3/04/20/3333 Nurse Notes: Pt needs a  diabetic foot exam, urine check and Pneumovax 23 vaccine at next in office apt. Pt declined a Hep C lab order,  colonoscopy referral and cologuard order at this time.

## 2020-05-13 ENCOUNTER — Ambulatory Visit (INDEPENDENT_AMBULATORY_CARE_PROVIDER_SITE_OTHER): Payer: Medicare Other

## 2020-05-13 ENCOUNTER — Other Ambulatory Visit: Payer: Self-pay

## 2020-05-13 DIAGNOSIS — Z0000 Encounter for general adult medical examination without abnormal findings: Secondary | ICD-10-CM | POA: Diagnosis not present

## 2020-05-13 NOTE — Patient Instructions (Signed)
Tony Williams , Thank you for taking time to come for your Medicare Wellness Visit. I appreciate your ongoing commitment to your health goals. Please review the following plan we discussed and let me know if I can assist you in the future.   Screening recommendations/referrals: Colonoscopy: Currently due, declined referral or cologuard order at this time. Recommended yearly ophthalmology/optometry visit for glaucoma screening and checkup Recommended yearly dental visit for hygiene and checkup  Vaccinations: Influenza vaccine: Done 10/2019 Pneumococcal vaccine: Pneumovax 23 due at next in office apt. Tdap vaccine: Currently due, declined receiving.  Shingles vaccine: Shingrix discussed. Please contact your pharmacy for coverage information.     Advanced directives: Please bring a copy of your POA (Power of Attorney) and/or Living Will to your next appointment.   Conditions/risks identified: Recommend to drink at least 6-8 8oz glasses of water per day.  Next appointment: 07/16/20 @ 1:20 PM with [place] 65 Years and Older, Male Preventive care refers to lifestyle choices and visits with your health care provider that can promote health and wellness. What does preventive care include?  A yearly physical exam. This is also called an annual well check.  Dental exams once or twice a year.  Routine eye exams. Ask your health care provider how often you should have your eyes checked.  Personal lifestyle choices, including:  Daily care of your teeth and gums.  Regular physical activity.  Eating a healthy diet.  Avoiding tobacco and drug use.  Limiting alcohol use.  Practicing safe sex.  Taking low doses of aspirin every day.  Taking vitamin and mineral supplements as recommended by your health care provider. What happens during an annual well check? The services and screenings done by your health care provider during your annual well check will depend on  your age, overall health, lifestyle risk factors, and family history of disease. Counseling  Your health care provider may ask you questions about your:  Alcohol use.  Tobacco use.  Drug use.  Emotional well-being.  Home and relationship well-being.  Sexual activity.  Eating habits.  History of falls.  Memory and ability to understand (cognition).  Work and work Statistician. Screening  You may have the following tests or measurements:  Height, weight, and BMI.  Blood pressure.  Lipid and cholesterol levels. These may be checked every 5 years, or more frequently if you are over 34 years old.  Skin check.  Lung cancer screening. You may have this screening every year starting at age 41 if you have a 30-pack-year history of smoking and currently smoke or have quit within the past 15 years.  Fecal occult blood test (FOBT) of the stool. You may have this test every year starting at age 35.  Flexible sigmoidoscopy or colonoscopy. You may have a sigmoidoscopy every 5 years or a colonoscopy every 10 years starting at age 54.  Prostate cancer screening. Recommendations will vary depending on your family history and other risks.  Hepatitis C blood test.  Hepatitis B blood test.  Sexually transmitted disease (STD) testing.  Diabetes screening. This is done by checking your blood sugar (glucose) after you have not eaten for a while (fasting). You may have this done every 1-3 years.  Abdominal aortic aneurysm (AAA) screening. You may need this if you are a current or former smoker.  Osteoporosis. You may be screened starting at age 58 if you are at high risk. Talk with your health care provider about your test results, treatment  options, and if necessary, the need for more tests. Vaccines  Your health care provider may recommend certain vaccines, such as:  Influenza vaccine. This is recommended every year.  Tetanus, diphtheria, and acellular pertussis (Tdap, Td) vaccine.  You may need a Td booster every 10 years.  Zoster vaccine. You may need this after age 57.  Pneumococcal 13-valent conjugate (PCV13) vaccine. One dose is recommended after age 17.  Pneumococcal polysaccharide (PPSV23) vaccine. One dose is recommended after age 47. Talk to your health care provider about which screenings and vaccines you need and how often you need them. This information is not intended to replace advice given to you by your health care provider. Make sure you discuss any questions you have with your health care provider. Document Released: 03/28/2015 Document Revised: 11/19/2015 Document Reviewed: 12/31/2014 Elsevier Interactive Patient Education  2017 [place] Prevention in the Home Falls can cause injuries. They can happen to people of all ages. There are many things you can do to make your home safe and to help prevent falls. What can I do on the outside of my home?  Regularly fix the edges of walkways and driveways and fix any cracks.  Remove anything that might make you trip as you walk through a door, such as a raised step or threshold.  Trim any bushes or trees on the path to your home.  Use bright outdoor lighting.  Clear any walking paths of anything that might make someone trip, such as rocks or tools.  Regularly check to see if handrails are loose or broken. Make sure that both sides of any steps have handrails.  Any raised decks and porches should have guardrails on the edges.  Have any leaves, snow, or ice cleared regularly.  Use sand or salt on walking paths during winter.  Clean up any spills in your garage right away. This includes oil or grease spills. What can I do in the bathroom?  Use night lights.  Install grab bars by the toilet and in the tub and shower. Do not use towel bars as grab bars.  Use non-skid mats or decals in the tub or shower.  If you need to sit down in the shower, use a plastic, non-slip stool.  Keep the  floor dry. Clean up any water that spills on the floor as soon as it happens.  Remove soap buildup in the tub or shower regularly.  Attach bath mats securely with double-sided non-slip rug tape.  Do not have throw rugs and other things on the floor that can make you trip. What can I do in the bedroom?  Use night lights.  Make sure that you have a light by your bed that is easy to reach.  Do not use any sheets or blankets that are too big for your bed. They should not hang down onto the floor.  Have a firm chair that has side arms. You can use this for support while you get dressed.  Do not have throw rugs and other things on the floor that can make you trip. What can I do in the kitchen?  Clean up any spills right away.  Avoid walking on wet floors.  Keep items that you use a lot in easy-to-reach places.  If you need to reach something above you, use a strong step stool that has a grab bar.  Keep electrical cords out of the way.  Do not use floor polish or wax that makes floors slippery.  If you must use wax, use non-skid floor wax.  Do not have throw rugs and other things on the floor that can make you trip. What can I do with my stairs?  Do not leave any items on the stairs.  Make sure that there are handrails on both sides of the stairs and use them. Fix handrails that are broken or loose. Make sure that handrails are as long as the stairways.  Check any carpeting to make sure that it is firmly attached to the stairs. Fix any carpet that is loose or worn.  Avoid having throw rugs at the top or bottom of the stairs. If you do have throw rugs, attach them to the floor with carpet tape.  Make sure that you have a light switch at the top of the stairs and the bottom of the stairs. If you do not have them, ask someone to add them for you. What else can I do to help prevent falls?  Wear shoes that:  Do not have high heels.  Have rubber bottoms.  Are comfortable and fit  you well.  Are closed at the toe. Do not wear sandals.  If you use a stepladder:  Make sure that it is fully opened. Do not climb a closed stepladder.  Make sure that both sides of the stepladder are locked into place.  Ask someone to hold it for you, if possible.  Clearly mark and make sure that you can see:  Any grab bars or handrails.  First and last steps.  Where the edge of each step is.  Use tools that help you move around (mobility aids) if they are needed. These include:  Canes.  Walkers.  Scooters.  Crutches.  Turn on the lights when you go into a dark area. Replace any light bulbs as soon as they burn out.  Set up your furniture so you have a clear path. Avoid moving your furniture around.  If any of your floors are uneven, fix them.  If there are any pets around you, be aware of where they are.  Review your medicines with your doctor. Some medicines can make you feel dizzy. This can increase your chance of falling. Ask your doctor what other things that you can do to help prevent falls. This information is not intended to replace advice given to you by your health care provider. Make sure you discuss any questions you have with your health care provider. Document Released: 12/26/2008 Document Revised: 08/07/2015 Document Reviewed: 04/05/2014 Elsevier Interactive Patient Education  2017 [employer].

## 2020-05-21 ENCOUNTER — Encounter
Admission: RE | Admit: 2020-05-21 | Discharge: 2020-05-21 | Disposition: A | Discharge status: Home / Self Care | Payer: Medicare Other | Source: Ambulatory Visit | Attending: Cardiology | Admitting: Cardiology

## 2020-05-21 ENCOUNTER — Other Ambulatory Visit: Payer: Self-pay

## 2020-05-21 DIAGNOSIS — R0600 Dyspnea, unspecified: Secondary | ICD-10-CM | POA: Diagnosis not present

## 2020-05-21 DIAGNOSIS — I2510 Atherosclerotic heart disease of native coronary artery without angina pectoris: Secondary | ICD-10-CM

## 2020-05-21 DIAGNOSIS — R0609 Other forms of dyspnea: Secondary | ICD-10-CM

## 2020-05-21 LAB — NM MYOCAR MULTI W/SPECT W/WALL MOTION / EF
LV dias vol: 145 mL (ref 62–150)
LV sys vol: 52 mL
Peak HR: 58 {beats}/min
Percent HR: 39 %
Rest HR: 53 {beats}/min
SDS: 8
SRS: 16
SSS: 22
TID: 0.91

## 2020-05-21 MED ORDER — TECHNETIUM TC 99M TETROFOSMIN IV KIT
30.0000 | PACK | Freq: Once | INTRAVENOUS | Status: AC | PRN
  Administered 2020-05-21: 31.08 via INTRAVENOUS

## 2020-05-21 MED ORDER — REGADENOSON 0.4 MG/5ML IV SOLN
0.4000 mg | Freq: Once | INTRAVENOUS | Status: AC
  Administered 2020-05-21: 0.4 mg via INTRAVENOUS
  Filled 2020-05-21: qty 5

## 2020-05-21 MED ORDER — TECHNETIUM TC 99M TETROFOSMIN IV KIT
10.0000 | PACK | Freq: Once | INTRAVENOUS | Status: AC | PRN
  Administered 2020-05-21: 10.18 via INTRAVENOUS

## 2020-05-22 ENCOUNTER — Ambulatory Visit (INDEPENDENT_AMBULATORY_CARE_PROVIDER_SITE_OTHER): Payer: Medicare Other

## 2020-05-22 DIAGNOSIS — I2510 Atherosclerotic heart disease of native coronary artery without angina pectoris: Secondary | ICD-10-CM | POA: Diagnosis not present

## 2020-05-22 DIAGNOSIS — R0600 Dyspnea, unspecified: Secondary | ICD-10-CM | POA: Diagnosis not present

## 2020-05-22 DIAGNOSIS — R0609 Other forms of dyspnea: Secondary | ICD-10-CM

## 2020-05-22 LAB — ECHOCARDIOGRAM COMPLETE
Area-P 1/2: 3.05 cm2
S' Lateral: 3.1 cm

## 2020-05-22 MED ORDER — PERFLUTREN LIPID MICROSPHERE
1.0000 mL | INTRAVENOUS | Status: AC | PRN
  Administered 2020-05-22: 3 mL via INTRAVENOUS

## 2020-06-02 ENCOUNTER — Encounter: Payer: Self-pay | Admitting: Cardiology

## 2020-06-02 ENCOUNTER — Other Ambulatory Visit: Payer: Self-pay

## 2020-06-02 ENCOUNTER — Ambulatory Visit: Payer: Medicare Other | Admitting: Cardiology

## 2020-06-02 VITALS — BP 140/64 | HR 78 | Ht 69.0 in | Wt 286.0 lb

## 2020-06-02 DIAGNOSIS — Z7189 Other specified counseling: Secondary | ICD-10-CM

## 2020-06-02 DIAGNOSIS — R0600 Dyspnea, unspecified: Secondary | ICD-10-CM | POA: Diagnosis not present

## 2020-06-02 DIAGNOSIS — I2510 Atherosclerotic heart disease of native coronary artery without angina pectoris: Secondary | ICD-10-CM | POA: Diagnosis not present

## 2020-06-02 DIAGNOSIS — Z7689 Persons encountering health services in other specified circumstances: Secondary | ICD-10-CM

## 2020-06-02 DIAGNOSIS — E7800 Pure hypercholesterolemia, unspecified: Secondary | ICD-10-CM

## 2020-06-02 DIAGNOSIS — G4730 Sleep apnea, unspecified: Secondary | ICD-10-CM

## 2020-06-02 DIAGNOSIS — R0609 Other forms of dyspnea: Secondary | ICD-10-CM

## 2020-06-02 NOTE — Progress Notes (Signed)
Cardiology Office Note:    Date:  06/02/2020   ID:  Tony Williams, DOB 02/05/1946, MRN [id number]  PCP:  Doreen Beam, [place]  Cardiologist:  No primary care provider on file.  Advanced Practice Provider:  No care team member to display Electrophysiologist:  None       Referring MD: Sharmon Leyden*   Chief Complaint  Patient presents with  . Other    Follow up post ECHO and Stress test. Patient states he can not walk to the mail box. Meds reviewed verbally with patient.     History of Present Illness:    Tony Williams is a 75 y.o. male with a hx of CAD/CABG x 4 2007, PCI x 2 2018, former smoker, possible COPD, OSA, obesity diabetes who presents for follow-up.  Last seen to establish care.  He endorsed having shortness of breath with exertion.  Echo and Myoview were ordered to evaluate cardiac function due to high risk for CAD.  Has a history of lung scarring, OSA.  Pulmonary evaluation was recommended last visit but patient declined.  He now states he would like to see pulmonary medicine especially as his shortness of breath with exertion is getting worse.  Prior notes Patient recently moved to the area from [facility].  States having CAD/CABG x4 back in 2007, in 2018 had a myocardial infarction, had 2 stents placed.  He is intolerant to statins, has tried multiple statins and not able to tolerate due to myalgias.  Currently able to tolerate low-dose lovastatin.    Had sleep study in the past and was diagnosed with OSA, he declines not using his CPAP mask.  Does not like the feet for the mask.    Past Medical History:  Diagnosis Date  . Cancer ([place])   . Chronic kidney disease   . COPD (chronic obstructive pulmonary disease) ([place])   . Depression   . Diabetes mellitus without complication ([place])   . H/O heart artery stent   . Heart problem     Past Surgical History:  Procedure Laterality Date  . ARTERIAL  BYPASS SURGRY    . EYE SURGERY    . IR STENT PLACEMENT ANTE CAROTID INC ANGIO    . KNEE SURGERY      Current Medications: Current Meds  Medication Sig  . albuterol (VENTOLIN HFA) 108 (90 Base) MCG/ACT inhaler Inhale 2 puffs into the lungs every 6 (six) hours as needed for wheezing or shortness of breath.  . allopurinol (ZYLOPRIM) 300 MG tablet Take 300 mg by mouth daily.  Marland Kitchen amLODipine (NORVASC) 10 MG tablet Take 10 mg by mouth daily.  Marland Kitchen aspirin EC 81 MG tablet Take 81 mg by mouth daily. Swallow whole.  . blood glucose meter kit and supplies Dispense based on patient and insurance preference. Use up to four times daily as directed. (FOR ICD-10 E10.9, E11.9). check blood glucose fasting and before meals four times daily.  . Cholecalciferol (VITAMIN D3 PO) Take 4,000 Units by mouth.  . clopidogrel (PLAVIX) 75 MG tablet Take 75 mg by mouth daily.  . Coenzyme Q10 (CO Q-10) 400 MG CAPS Take by mouth daily at 6 (six) AM.  . escitalopram (LEXAPRO) 20 MG tablet Take 20 mg by mouth daily.  . Evolocumab (REPATHA SURECLICK) 270 MG/ML SOAJ Inject 1 pen into the skin every 14 (fourteen) days.  . fluticasone-salmeterol (ADVAIR HFA) 230-21 MCG/ACT inhaler Inhale 1-2 puffs into the lungs 2 (two)  times daily. Rinse mouth after each use.  . gabapentin (NEURONTIN) 300 MG capsule TAKE 1 CAPSULE BY MOUTH EVERY NIGHT AT BEDTIME  . glimepiride (AMARYL) 1 MG tablet TAKE 1 TABLET BY MOUTH TWICE DAILY, THIS REPLACES METFORMIN  . HYDROcodone-acetaminophen (NORCO) 5-325 MG tablet Take 1 tablet by mouth every 8 (eight) hours as needed for moderate pain.  Marland Kitchen lovastatin (MEVACOR) 40 MG tablet Take 40 mg by mouth daily.  . metoprolol tartrate (LOPRESSOR) 25 MG tablet Take 25 mg by mouth daily at 6 (six) AM.  . Omega-3 Fatty Acids (FISH OIL) 1200 MG CAPS Take by mouth daily at 6 (six) AM.  . traZODone (DESYREL) 150 MG tablet Take 150-300 mg by mouth at bedtime as needed.     Allergies:   Stadol [butorphanol]   Social  History   Socioeconomic History  . Marital status: Married    Spouse name: Not on file  . Number of children: 2  . Years of education: Not on file  . Highest education level: High school graduate  Occupational History  . Occupation: retired  Tobacco Use  . Smoking status: Former Smoker    Quit date: 10/23/2003    Years since quitting: 16.6  . Smokeless tobacco: Never Used  Vaping Use  . Vaping Use: Never used  Substance and Sexual Activity  . Alcohol use: Never  . Drug use: Never  . Sexual activity: Not on file  Other Topics Concern  . Not on file  Social History Narrative  . Not on file   Social Determinants of Health   Financial Resource Strain: Low Risk   . Difficulty of Paying Living Expenses: Not hard at all  Food Insecurity: No Food Insecurity  . Worried About Charity fundraiser in the Last Year: Never true  . Ran Out of Food in the Last Year: Never true  Transportation Needs: No Transportation Needs  . Lack of Transportation (Medical): No  . Lack of Transportation (Non-Medical): No  Physical Activity: Inactive  . Days of Exercise per Week: 0 days  . Minutes of Exercise per Session: 0 min  Stress: No Stress Concern Present  . Feeling of Stress : Not at all  Social Connections: Moderately Isolated  . Frequency of Communication with Friends and Family: More than three times a week  . Frequency of Social Gatherings with Friends and Family: Three times a week  . Attends Religious Services: Never  . Active Member of Clubs or Organizations: No  . Attends Archivist Meetings: Never  . Marital Status: Married     Family History: The patient's family history includes Heart Problems in his father and mother.  ROS:   Please see the history of present illness.     All other systems reviewed and are negative.  EKGs/Labs/Other Studies Reviewed:    The following studies were reviewed today:   EKG:  EKG not  ordered today.   Recent Labs: 04/22/2020: ALT  11; BUN 23; Creatinine, Ser 1.55; Hemoglobin 15.6; Platelets 221; Potassium 5.4; Sodium 141; TSH 4.100  Recent Lipid Panel    Component Value Date/Time   CHOL 218 (H) 04/22/2020 1017   TRIG 282 (H) 04/22/2020 1017   HDL 34 (L) 04/22/2020 1017   CHOLHDL 6.4 (H) 04/22/2020 1017   LDLCALC 133 (H) 04/22/2020 1017     Risk Assessment/Calculations:      Physical Exam:    VS:  BP 140/64 (BP Location: Left Arm, Patient Position: Sitting, Cuff Size: Normal)  Pulse 78   Ht _0  (1.753 m)   Wt 286 lb (129.7 kg)   SpO2 90%   BMI 42.23 kg/m     Wt Readings from Last 3 Encounters:  06/02/20 286 lb (129.7 kg)  05/05/20 285 lb (129.3 kg)  04/18/20 292 lb (132.5 kg)     GEN:  Well nourished, well developed in no acute distress HEENT: Normal NECK: No JVD; No carotid bruits LYMPHATICS: No lymphadenopathy CARDIAC: RRR, no murmurs, rubs, gallops RESPIRATORY: Decreased breath sounds at bases, ABDOMEN: Soft, non-tender, non-distended MUSCULOSKELETAL:  No edema; No deformity  SKIN: Warm and dry NEUROLOGIC:  Alert and oriented x 3 PSYCHIATRIC:  Normal affect   ASSESSMENT:    1. Dyspnea on exertion   2. Coronary artery disease involving native heart, unspecified vessel or lesion type, unspecified whether angina present   3. Pure hypercholesterolemia   4. Encounter for evaluation of COPD   5. Encounter for counseling for sleep apnea    PLAN:    In order of problems listed above:  1. Dyspnea on exertion, echocardiogram showed normal systolic function, EF 60 to 65%, indeterminate diastolic function.  Lexiscan Myoview with no evidence for ischemia.  Large fixed inferior defect consistent with prior scar.  Symptoms could well be secondary to pulmonary pathology, OSA, being morbidly obese.  Pulmonary referral regarding management of possible OSA previously recommended, patient declined stating he will not want to wear CPAP mask or perform sleep study.  Weight loss advised, graduated  exercising advised 2. History of CABG x4, PCI x2.  Echo with preserved EF, Lexiscan with fixed inferior defects consistent with prior MI.  Continue aspirin/Plavix/statin. 3. Hyperlipidemia, LDL not at goal.  Patient not tolerant to higher doses of statin.  Continue low-dose lovastatin.  Continue Repatha.  Plan to repeat lipid panel in 3 months. 4. History of COPD, former smoker, refer to pulmonary medicine. 5. History of OSA, chronic dyspnea on exertion, refer to pulmonary for sleep study and management.  Follow-up in 3 months.      Medication Adjustments/Labs and Tests Ordered: Current medicines are reviewed at length with the patient today.  Concerns regarding medicines are outlined above.  Orders Placed This Encounter  Procedures  . Lipid panel  . Ambulatory referral to Pulmonology   No orders of the defined types were placed in this encounter.   Patient Instructions  Medication Instructions:  Your physician recommends that you continue on your current medications as directed. Please refer to the Current Medication list given to you today.  *If you need a refill on your cardiac medications before your next appointment, please call your pharmacy*  Lab Work: Your physician recommends that you return for lab work in: [place] will need to be fasting. Please do not have anything to eat or drink after midnight the morning you have the lab work. You may only have water or black coffee with no cream or sugar. - Please go to the [facility]. You will check in at the front desk to the right as you walk into the atrium. Valet Parking is offered if needed. - No appointment needed. You may go any day between 7 am and 6 pm.  If you have labs (blood work) drawn today and your tests are completely normal, you will receive your results only by: Marland Kitchen MyChart Message (if you have MyChart) OR . A paper copy in the mail If you have any lab test that is abnormal  or we need to change your treatment, we will call you to review the results.  Testing/Procedures: none  Follow-Up:  You have been referred to TO PULMONOLOGY FOR EVALUATION OF SLEEP APNEA AND COPD.  At [facility], you and your health needs are our priority.  As part of our continuing mission to provide you with exceptional heart care, we have created designated Provider Care Teams.  These Care Teams include your primary Cardiologist (physician) and Advanced Practice Providers (APPs -  Physician Assistants and Nurse Practitioners) who all work together to provide you with the care you need, when you need it.  We recommend signing up for the patient portal called "MyChart".  Sign up information is provided on this After Visit Summary.  MyChart is used to connect with patients for Virtual Visits (Telemedicine).  Patients are able to view lab/test results, encounter notes, upcoming appointments, etc.  Non-urgent messages can be sent to your provider as well.   To learn more about what you can do with MyChart, go to [email or web address].    Your next appointment:   3 month(s)  The format for your next appointment:   In Person  Provider:   Kate Sable, MD     Signed, Kate Sable, MD  06/02/2020 4:12 PM    [place]

## 2020-06-02 NOTE — Patient Instructions (Signed)
Medication Instructions:  Your physician recommends that you continue on your current medications as directed. Please refer to the Current Medication list given to you today.  *If you need a refill on your cardiac medications before your next appointment, please call your pharmacy*  Lab Work: Your physician recommends that you return for lab work in: [place] will need to be fasting. Please do not have anything to eat or drink after midnight the morning you have the lab work. You may only have water or black coffee with no cream or sugar. - Please go to the [facility]. You will check in at the front desk to the right as you walk into the atrium. Valet Parking is offered if needed. - No appointment needed. You may go any day between 7 am and 6 pm.  If you have labs (blood work) drawn today and your tests are completely normal, you will receive your results only by: Marland Kitchen MyChart Message (if you have MyChart) OR . A paper copy in the mail If you have any lab test that is abnormal or we need to change your treatment, we will call you to review the results.  Testing/Procedures: none  Follow-Up:  You have been referred to TO PULMONOLOGY FOR EVALUATION OF SLEEP APNEA AND COPD.  At [facility], you and your health needs are our priority.  As part of our continuing mission to provide you with exceptional heart care, we have created designated Provider Care Teams.  These Care Teams include your primary Cardiologist (physician) and Advanced Practice Providers (APPs -  Physician Assistants and Nurse Practitioners) who all work together to provide you with the care you need, when you need it.  We recommend signing up for the patient portal called "MyChart".  Sign up information is provided on this After Visit Summary.  MyChart is used to connect with patients for Virtual Visits (Telemedicine).  Patients are able to view lab/test results, encounter notes, upcoming  appointments, etc.  Non-urgent messages can be sent to your provider as well.   To learn more about what you can do with MyChart, go to [email or web address].    Your next appointment:   3 month(s)  The format for your next appointment:   In Person  Provider:   Kate Sable, MD

## 2020-06-03 ENCOUNTER — Telehealth: Payer: Self-pay

## 2020-06-03 NOTE — Telephone Encounter (Signed)
Patient called to request a refill for the following medications:  HYDROcodone-acetaminophen (NORCO) 5-325 MG tablet  Fill at:  [id number] - [place], [place] - [place] Phone:  [phone]  Fax:  [phone]       Thanks, [facility]

## 2020-06-04 ENCOUNTER — Other Ambulatory Visit: Payer: Self-pay | Admitting: Adult Health

## 2020-06-04 MED ORDER — HYDROCODONE-ACETAMINOPHEN 5-325 MG PO TABS: 1.0000 | ORAL_TABLET | Freq: Three times a day (TID) | ORAL | 0 refills | Status: DC | PRN

## 2020-06-04 NOTE — Telephone Encounter (Signed)
Refilled

## 2020-06-04 NOTE — Progress Notes (Signed)
Meds ordered this encounter  Medications  . HYDROcodone-acetaminophen (NORCO) 5-325 MG tablet    Sig: Take 1 tablet by mouth every 8 (eight) hours as needed for moderate pain.    Dispense:  90 tablet    Refill:  0  refill request and due. PMP verified.

## 2020-06-04 NOTE — Telephone Encounter (Signed)
Please review request below. KW 

## 2020-06-09 ENCOUNTER — Telehealth: Payer: Self-pay

## 2020-06-09 NOTE — Telephone Encounter (Signed)
Copied from [place] [id number]. Topic: General - Other >> Jun 09, 2020 11:47 AM Celene Kras wrote: Reason for CRM: Pt called stating that he needs a refill on trazadone. Pt states that his bottle says he needs an appt in order to get a refill. Pt refused. Please advise.

## 2020-06-10 ENCOUNTER — Other Ambulatory Visit: Payer: Self-pay | Admitting: Adult Health

## 2020-06-10 MED ORDER — TRAZODONE HCL 150 MG PO TABS: 150.0000 mg | ORAL_TABLET | Freq: Every evening | ORAL | 0 refills | Status: DC | PRN

## 2020-06-10 NOTE — Telephone Encounter (Signed)
This was a historical medication. I had not filled his trazodone. It was listed he was taking 150 mg to 300 mg nightly. This is not advised. I have sent Trazodone 150 mg tablets and advise him to  take half of tablet (75mg ) to a whole tablet (150mg )  once at night PRN for sleep. Maximum nightly dose is 200 mg. He is on Lexapro and this with Trazodone can increase serotonin syndrome so the less he can use the better. Keep scheduled follow up's as directed.

## 2020-07-01 ENCOUNTER — Other Ambulatory Visit: Payer: Self-pay | Admitting: Adult Health

## 2020-07-01 MED ORDER — HYDROCODONE-ACETAMINOPHEN 5-325 MG PO TABS: 1.0000 | ORAL_TABLET | Freq: Three times a day (TID) | ORAL | 0 refills | Status: DC | PRN

## 2020-07-01 NOTE — Telephone Encounter (Signed)
Requested medication (s) are due for refill today - yes  Requested medication (s) are on the active medication list -yes  Future visit scheduled -yes  Last refill: 06/04/20  Notes to clinic: Request RF non delegated rx  Requested Prescriptions  Pending Prescriptions Disp Refills   HYDROcodone-acetaminophen (NORCO) 5-325 MG tablet 90 tablet 0    Sig: Take 1 tablet by mouth every 8 (eight) hours as needed for moderate pain.      Not Delegated - Analgesics:  Opioid Agonist Combinations Failed - 07/01/2020 12:14 PM      Failed - This refill cannot be delegated      Failed - Urine Drug Screen completed in last 360 days      Passed - Valid encounter within last 6 months    Recent Outpatient Visits           1 month ago Sinus congestion   [place], [place], PA-C   2 months ago Chronic obstructive pulmonary disease, unspecified COPD type [facility])   [place], Kelby Aline, FNP       Future Appointments             In 2 weeks Bacigalupo, Dionne Bucy, MD [facility], [facility]   In 2 months [place], Aaron Edelman, MD [facility], [facility]              Refused Prescriptions Disp Refills   traZODone (DESYREL) 150 MG tablet 30 tablet 0    Sig: Take 1 tablet (150 mg total) by mouth at bedtime as needed (maximum dose is 200mg  per day.).      Psychiatry: Antidepressants - Serotonin Modulator Passed - 07/01/2020 12:14 PM      Passed - Valid encounter within last 6 months    Recent Outpatient Visits           1 month ago Sinus congestion   [place], [place], PA-C   2 months ago Chronic obstructive pulmonary disease, unspecified COPD type [facility])   [place], Kelby Aline, FNP       Future Appointments             In 2 weeks Bacigalupo, Dionne Bucy, MD [facility], [facility]   In 2 months Agbor-Etang, Aaron Edelman, MD [facility], [facility]                  Requested Prescriptions  Pending Prescriptions Disp Refills   HYDROcodone-acetaminophen (NORCO) 5-325 MG tablet 90 tablet 0    Sig: Take 1 tablet by mouth every 8 (eight) hours as needed for moderate pain.      Not Delegated - Analgesics:  Opioid Agonist Combinations Failed - 07/01/2020 12:14 PM      Failed - This refill cannot be delegated      Failed - Urine Drug Screen completed in last 360 days      Passed - Valid encounter within last 6 months    Recent Outpatient Visits           1 month ago Sinus congestion   [place], [place], PA-C   2 months ago Chronic obstructive pulmonary disease, unspecified COPD type [facility])   [place], Kelby Aline, FNP       Future Appointments             In 2 weeks Bacigalupo, Dionne Bucy, MD [facility], [place]   In 2 months Ag[place]-Etang, Aaron Edelman,  MD [place], [facility]              Refused Prescriptions Disp Refills   traZODone (DESYREL) 150 MG tablet 30 tablet 0    Sig: Take 1 tablet (150 mg total) by mouth at bedtime as needed (maximum dose is 200mg  per day.).      Psychiatry: Antidepressants - Serotonin Modulator Passed - 07/01/2020 12:14 PM      Passed - Valid encounter within last 6 months    Recent Outpatient Visits           1 month ago Sinus congestion   [place], [place], [place]   2 months ago Chronic obstructive pulmonary disease, unspecified COPD type [facility])   [place], Kelby Aline, FNP       Future Appointments             In 2 weeks Bacigalupo, Dionne Bucy, MD [facility], [place]   In 2 months Agbor-Etang, Aaron Edelman, MD [facility], [place]

## 2020-07-01 NOTE — Telephone Encounter (Signed)
Medication Refill - Medication: Hydrocodone 5/325   Trazodone 150mg   Has the patient contacted their pharmacy? No. (Agent: If no, request that the patient contact the pharmacy for the refill.) (Agent: If yes, when and what did the pharmacy advise?)  Preferred Pharmacy (with phone number or street name): [facility] [address] [employer]  Agent: Please be advised that RX refills may take up to 3 business days. We ask that you follow-up with your pharmacy.

## 2020-07-02 ENCOUNTER — Other Ambulatory Visit: Payer: Self-pay | Admitting: Adult Health

## 2020-07-15 ENCOUNTER — Institutional Professional Consult (permissible substitution): Payer: Medicare Other | Admitting: Pulmonary Disease

## 2020-07-16 ENCOUNTER — Ambulatory Visit: Payer: Self-pay | Admitting: Adult Health

## 2020-07-21 ENCOUNTER — Ambulatory Visit (INDEPENDENT_AMBULATORY_CARE_PROVIDER_SITE_OTHER): Payer: Medicare Other | Admitting: Family Medicine

## 2020-07-21 ENCOUNTER — Encounter: Payer: Self-pay | Admitting: Family Medicine

## 2020-07-21 ENCOUNTER — Other Ambulatory Visit: Payer: Self-pay

## 2020-07-21 VITALS — BP 113/58 | HR 52 | Temp 97.8°F | Resp 16 | Ht 69.0 in | Wt 271.7 lb

## 2020-07-21 DIAGNOSIS — E1169 Type 2 diabetes mellitus with other specified complication: Secondary | ICD-10-CM | POA: Diagnosis not present

## 2020-07-21 DIAGNOSIS — Z6841 Body Mass Index (BMI) 40.0 and over, adult: Secondary | ICD-10-CM

## 2020-07-21 DIAGNOSIS — Z0000 Encounter for general adult medical examination without abnormal findings: Secondary | ICD-10-CM

## 2020-07-21 DIAGNOSIS — Z1211 Encounter for screening for malignant neoplasm of colon: Secondary | ICD-10-CM

## 2020-07-21 DIAGNOSIS — F411 Generalized anxiety disorder: Secondary | ICD-10-CM | POA: Diagnosis not present

## 2020-07-21 DIAGNOSIS — E1142 Type 2 diabetes mellitus with diabetic polyneuropathy: Secondary | ICD-10-CM | POA: Diagnosis not present

## 2020-07-21 DIAGNOSIS — E785 Hyperlipidemia, unspecified: Secondary | ICD-10-CM | POA: Diagnosis not present

## 2020-07-21 DIAGNOSIS — F5104 Psychophysiologic insomnia: Secondary | ICD-10-CM

## 2020-07-21 DIAGNOSIS — M25561 Pain in right knee: Secondary | ICD-10-CM

## 2020-07-21 DIAGNOSIS — N1831 Chronic kidney disease, stage 3a: Secondary | ICD-10-CM | POA: Insufficient documentation

## 2020-07-21 DIAGNOSIS — Z23 Encounter for immunization: Secondary | ICD-10-CM | POA: Diagnosis not present

## 2020-07-21 DIAGNOSIS — M1A30X0 Chronic gout due to renal impairment, unspecified site, without tophus (tophi): Secondary | ICD-10-CM

## 2020-07-21 DIAGNOSIS — J449 Chronic obstructive pulmonary disease, unspecified: Secondary | ICD-10-CM

## 2020-07-21 DIAGNOSIS — G8929 Other chronic pain: Secondary | ICD-10-CM

## 2020-07-21 DIAGNOSIS — E1159 Type 2 diabetes mellitus with other circulatory complications: Secondary | ICD-10-CM

## 2020-07-21 DIAGNOSIS — I152 Hypertension secondary to endocrine disorders: Secondary | ICD-10-CM

## 2020-07-21 DIAGNOSIS — M25562 Pain in left knee: Secondary | ICD-10-CM

## 2020-07-21 LAB — POCT UA - MICROALBUMIN: Microalbumin Ur, POC: 100 mg/L

## 2020-07-21 MED ORDER — GABAPENTIN 300 MG PO CAPS: 2.0000 | ORAL_CAPSULE | Freq: Every day | ORAL | 1 refills | Status: DC

## 2020-07-21 MED ORDER — LOVASTATIN 40 MG PO TABS: 40.0000 mg | ORAL_TABLET | Freq: Every day | ORAL | 3 refills | Status: DC

## 2020-07-21 MED ORDER — GLIMEPIRIDE 1 MG PO TABS: 1.0000 mg | ORAL_TABLET | Freq: Every day | ORAL | 1 refills | Status: DC

## 2020-07-21 MED ORDER — AMLODIPINE BESYLATE 10 MG PO TABS: 10.0000 mg | ORAL_TABLET | Freq: Every day | ORAL | 1 refills | Status: DC

## 2020-07-21 MED ORDER — TRAZODONE HCL 150 MG PO TABS: 150.0000 mg | ORAL_TABLET | Freq: Every evening | ORAL | 1 refills | Status: DC | PRN

## 2020-07-21 MED ORDER — ALLOPURINOL 300 MG PO TABS: 300.0000 mg | ORAL_TABLET | Freq: Every day | ORAL | 3 refills | Status: DC

## 2020-07-21 NOTE — Assessment & Plan Note (Signed)
Well controlled Continue current medications Recheck metabolic panel F/u in 6 months  

## 2020-07-21 NOTE — Addendum Note (Signed)
Addended by: Shawna Orleans on: 07/21/2020 02:50 PM   Modules accepted: Orders

## 2020-07-21 NOTE — Assessment & Plan Note (Signed)
Discussed importance of healthy weight management Discussed diet and exercise  

## 2020-07-21 NOTE — Assessment & Plan Note (Signed)
Uncontrolled.  Increase gabapentin to 600 mg qhs 

## 2020-07-21 NOTE — Assessment & Plan Note (Signed)
Recheck metabolic panel Avoid NSAIDs 

## 2020-07-21 NOTE — Assessment & Plan Note (Signed)
Fairly well controlled Continue lexapro at current dose Increase trazodone to previous dose

## 2020-07-21 NOTE — Assessment & Plan Note (Signed)
Followed by pulm.  

## 2020-07-21 NOTE — Assessment & Plan Note (Signed)
Resume higher dose trazodone that was previously well controlling it

## 2020-07-21 NOTE — Progress Notes (Signed)
Complete physical exam   Patient: Wendy Mikles   DOB: 01-Dec-1945   75 y.o. Male  MRN: [id number] Visit Date: 07/21/2020  Today's healthcare provider: [name], MD   Chief Complaint  Patient presents with  . Annual Exam   Subjective    Breland Trouten is a 75 y.o. male who presents today for a complete physical exam.  He reports consuming a general diet. The patient does not participate in regular exercise at present. He generally feels fairly well. He reports sleeping fairly well. He does not have additional problems to discuss today.  HPI  04/22/20 PSA 0.1  Neuropathy He states that at night he feels a stinging sensation in his feet. The pain is so severe that he states that he is unable to sleep properly. He was prescribed Gabapentin but it does not treat the pain in his feet, only the pain in his legs.  Feet Pain He states that the Escitalopram that he was prescribed is not working as well as it has in the past. He believes that this may be due to him taking it daily for a long period of time.   Shortness of Breath He states that he is experiencing SOB. This was due to a past surgery in which scar tissue was left on his right lung.    Past Medical History:  Diagnosis Date  . Cancer ([place])   . Chronic kidney disease   . COPD (chronic obstructive pulmonary disease) ([place])   . Depression   . Diabetes mellitus without complication ([place])   . H/O heart artery stent   . Heart problem    Past Surgical History:  Procedure Laterality Date  . ARTERIAL BYPASS SURGRY    . EYE SURGERY    . IR STENT PLACEMENT ANTE CAROTID INC ANGIO    . KNEE SURGERY     Social History   Socioeconomic History  . Marital status: Married    Spouse name: Not on file  . Number of children: 2  . Years of education: Not on file  . Highest education level: High school graduate  Occupational History  . Occupation: retired  Tobacco Use  . Smoking status: Former Smoker    Quit  date: 10/23/2003    Years since quitting: 16.7  . Smokeless tobacco: Never Used  Vaping Use  . Vaping Use: Never used  Substance and Sexual Activity  . Alcohol use: Never  . Drug use: Never  . Sexual activity: Not on file  Other Topics Concern  . Not on file  Social History Narrative  . Not on file   Social Determinants of Health   Financial Resource Strain: Low Risk   . Difficulty of Paying Living Expenses: Not hard at all  Food Insecurity: No Food Insecurity  . Worried About Charity fundraiser in the Last Year: Never true  . Ran Out of Food in the Last Year: Never true  Transportation Needs: No Transportation Needs  . Lack of Transportation (Medical): No  . Lack of Transportation (Non-Medical): No  Physical Activity: Inactive  . Days of Exercise per Week: 0 days  . Minutes of Exercise per Session: 0 min  Stress: No Stress Concern Present  . Feeling of Stress : Not at all  Social Connections: Moderately Isolated  . Frequency of Communication with Friends and Family: More than three times a week  . Frequency of Social Gatherings with Friends and Family: Three times a week  . Attends Religious  Services: Never  . Active Member of Clubs or Organizations: No  . Attends Archivist Meetings: Never  . Marital Status: Married  Human resources officer Violence: Not At Risk  . Fear of Current or Ex-Partner: No  . Emotionally Abused: No  . Physically Abused: No  . Sexually Abused: No   Family Status  Relation Name Status  . Mother  (Not Specified)  . Father  (Not Specified)   Family History  Problem Relation Age of Onset  . Heart Problems Mother   . Heart Problems Father    Allergies  Allergen Reactions  . Stadol [Butorphanol] Itching    Patient Care Team: Virginia Crews, MD as PCP - General (Family Medicine) Kate Sable, MD as Consulting Physician (Cardiology)   Medications: Outpatient Medications Prior to Visit  Medication Sig  . albuterol  (VENTOLIN HFA) 108 (90 Base) MCG/ACT inhaler Inhale 2 puffs into the lungs every 6 (six) hours as needed for wheezing or shortness of breath.  Marland Kitchen aspirin EC 81 MG tablet Take 81 mg by mouth daily. Swallow whole.  . blood glucose meter kit and supplies Dispense based on patient and insurance preference. Use up to four times daily as directed. (FOR ICD-10 E10.9, E11.9). check blood glucose fasting and before meals four times daily.  . Cholecalciferol (VITAMIN D3 PO) Take 4,000 Units by mouth.  . clopidogrel (PLAVIX) 75 MG tablet Take 75 mg by mouth daily.  . Coenzyme Q10 (CO Q-10) 400 MG CAPS Take by mouth daily at 6 (six) AM.  . escitalopram (LEXAPRO) 20 MG tablet Take 20 mg by mouth daily.  . Evolocumab (REPATHA SURECLICK) 326 MG/ML SOAJ Inject 1 pen into the skin every 14 (fourteen) days.  . fluticasone-salmeterol (ADVAIR HFA) 230-21 MCG/ACT inhaler Inhale 1-2 puffs into the lungs 2 (two) times daily. Rinse mouth after each use.  Marland Kitchen HYDROcodone-acetaminophen (NORCO) 5-325 MG tablet Take 1 tablet by mouth every 8 (eight) hours as needed for moderate pain.  . metoprolol tartrate (LOPRESSOR) 25 MG tablet Take 25 mg by mouth daily at 6 (six) AM.  . Omega-3 Fatty Acids (FISH OIL) 1200 MG CAPS Take by mouth daily at 6 (six) AM.  . [DISCONTINUED] allopurinol (ZYLOPRIM) 300 MG tablet Take 300 mg by mouth daily.  . [DISCONTINUED] amLODipine (NORVASC) 10 MG tablet Take 10 mg by mouth daily.  . [DISCONTINUED] gabapentin (NEURONTIN) 300 MG capsule TAKE 1 CAPSULE BY MOUTH EVERY NIGHT AT BEDTIME  . [DISCONTINUED] glimepiride (AMARYL) 1 MG tablet TAKE 1 TABLET BY MOUTH TWICE DAILY, THIS REPLACES METFORMIN  . [DISCONTINUED] lovastatin (MEVACOR) 40 MG tablet Take 40 mg by mouth daily.  . [DISCONTINUED] traZODone (DESYREL) 150 MG tablet TAKE 1 TABLET(150 MG) BY MOUTH AT BEDTIME AS NEEDED. MAXIMUM DOSE IS 200 MG DAILY   No facility-administered medications prior to visit.    Review of Systems  Constitutional:  Negative for activity change, appetite change, chills, fatigue and fever.  HENT: Negative for congestion, ear pain, rhinorrhea, sinus pain and sore throat.   Respiratory: Positive for shortness of breath. Negative for cough and wheezing.   Cardiovascular: Negative for chest pain, palpitations and leg swelling.  Gastrointestinal: Negative for abdominal pain, blood in stool, diarrhea, nausea and vomiting.  Genitourinary: Negative for dysuria, flank pain, frequency and urgency.  Musculoskeletal: Positive for arthralgias, back pain and myalgias.  Neurological: Positive for numbness. Negative for dizziness and headaches.  Psychiatric/Behavioral: Positive for sleep disturbance.    Last CBC Lab Results  Component Value Date  WBC 8.9 04/22/2020   HGB 15.6 04/22/2020   HCT 46.9 04/22/2020   MCV 90 04/22/2020   MCH 29.8 04/22/2020   RDW 14.4 04/22/2020   PLT 221 13/14/3888   Last metabolic panel Lab Results  Component Value Date   GLUCOSE 132 (H) 04/22/2020   NA 141 04/22/2020   K 5.4 (H) 04/22/2020   CL 99 04/22/2020   CO2 21 04/22/2020   BUN 23 04/22/2020   CREATININE 1.55 (H) 04/22/2020   GFRNONAA 44 (L) 04/22/2020   GFRAA 51 (L) 04/22/2020   CALCIUM 10.1 04/22/2020   PROT 6.4 04/22/2020   ALBUMIN 3.8 04/22/2020   LABGLOB 2.6 04/22/2020   AGRATIO 1.5 04/22/2020   BILITOT 0.3 04/22/2020   ALKPHOS 53 04/22/2020   AST 10 04/22/2020   ALT 11 04/22/2020   Last lipids Lab Results  Component Value Date   CHOL 218 (H) 04/22/2020   HDL 34 (L) 04/22/2020   LDLCALC 133 (H) 04/22/2020   TRIG 282 (H) 04/22/2020   CHOLHDL 6.4 (H) 04/22/2020   Last hemoglobin A1c Lab Results  Component Value Date   HGBA1C 6.4 (H) 04/22/2020   Last thyroid functions Lab Results  Component Value Date   TSH 4.100 04/22/2020   Last vitamin D Lab Results  Component Value Date   VD25OH 38.8 04/22/2020     Objective    BP (!) 113/58 (BP Location: Left Arm, Patient Position: Sitting, Cuff  Size: Large)   Pulse (!) 52   Temp 97.8 F (36.6 C) (Oral)   Resp 16   Ht 5' 9"  (1.753 m)   Wt 271 lb 11.2 oz (123.2 kg)   SpO2 94%   BMI 40.12 kg/m  BP Readings from Last 3 Encounters:  07/21/20 (!) 113/58  06/02/20 140/64  05/05/20 128/62   Wt Readings from Last 3 Encounters:  07/21/20 271 lb 11.2 oz (123.2 kg)  06/02/20 286 lb (129.7 kg)  05/05/20 285 lb (129.3 kg)      Physical Exam Vitals reviewed.  Constitutional:      General: He is not in acute distress.    Appearance: Normal appearance. He is not diaphoretic.  HENT:     Head: Normocephalic and atraumatic.  Eyes:     General: No scleral icterus.    Conjunctiva/sclera: Conjunctivae normal.  Cardiovascular:     Rate and Rhythm: Normal rate and regular rhythm.     Pulses: Normal pulses.     Heart sounds: Normal heart sounds. No murmur heard.   Pulmonary:     Effort: Pulmonary effort is normal. No respiratory distress.     Breath sounds: Normal breath sounds. No wheezing or rhonchi.  Abdominal:     General: There is no distension.     Palpations: Abdomen is soft.     Tenderness: There is no abdominal tenderness.  Musculoskeletal:     Cervical back: Neck supple.     Right lower leg: No edema.     Left lower leg: No edema.  Lymphadenopathy:     Cervical: No cervical adenopathy.  Skin:    General: Skin is warm and dry.     Capillary Refill: Capillary refill takes less than 2 seconds.     Findings: No rash.  Neurological:     Mental Status: He is alert and oriented to person, place, and time.     Cranial Nerves: No cranial nerve deficit.  Psychiatric:        Mood and Affect: Mood normal.  Behavior: Behavior normal.     Diabetic Foot Exam - Simple   Simple Foot Form Diabetic Foot exam was performed with the following findings: Yes 07/21/2020  2:18 PM  Visual Inspection No deformities, no ulcerations, no other skin breakdown bilaterally: Yes Sensation Testing Intact to touch and monofilament  testing bilaterally: Yes Pulse Check Posterior Tibialis and Dorsalis pulse intact bilaterally: Yes Comments      Last depression screening scores PHQ 2/9 Scores 05/13/2020 04/18/2020  PHQ - 2 Score 0 -  Exception Documentation - Patient refusal   Last fall risk screening Fall Risk  05/13/2020  Falls in the past year? 0  Number falls in past yr: 0  Injury with Fall? 0   Last Audit-C alcohol use screening Alcohol Use Disorder Test (AUDIT) 05/13/2020  Patient refused Alcohol Screening Tool -  1. How often do you have a drink containing alcohol? 0  2. How many drinks containing alcohol do you have on a typical day when you are drinking? 0  3. How often do you have six or more drinks on one occasion? 0  AUDIT-C Score 0  Alcohol Brief Interventions/Follow-up AUDIT Score <7 follow-up not indicated   A score of 3 or more in women, and 4 or more in men indicates increased risk for alcohol abuse, EXCEPT if all of the points are from question 1   Results for orders placed or performed in visit on 07/21/20  HM HEPATITIS C SCREENING LAB  Result Value Ref Range   HM Hepatitis Screen Negative-Validated     Assessment & Plan    Routine Health Maintenance and Physical Exam  Exercise Activities and Dietary recommendations Goals    . DIET - INCREASE WATER INTAKE     Recommend to drink at least 6-8 8oz glasses of water per day.       Immunization History  Administered Date(s) Administered  . Influenza, High Dose Seasonal PF 01/13/2020  . Moderna Sars-Covid-2 Vaccination 04/05/2019, 05/03/2019, 01/10/2020  . Pneumococcal Conjugate-13 10/10/2014    Health Maintenance  Topic Date Due  . OPHTHALMOLOGY EXAM  Never done  . URINE MICROALBUMIN  Never done  . PNA vac Low Risk Adult (2 of 2 - PPSV23) 10/10/2015  . COLONOSCOPY (Pts 45-61yr Insurance coverage will need to be confirmed)  07/11/2018  . TETANUS/TDAP  05/13/2021 (Originally 08/12/1964)  . INFLUENZA VACCINE  10/13/2020  . HEMOGLOBIN  A1C  10/20/2020  . FOOT EXAM  07/21/2021  . COVID-19 Vaccine  Completed  . Hepatitis C Screening  Completed  . HPV VACCINES  Aged Out    Discussed health benefits of physical activity, and encouraged him to engage in regular exercise appropriate for his age and condition.  Problem List Items Addressed This Visit      Cardiovascular and Mediastinum   Hy[place]ension associated with diabetes ([place]    Well controlled Continue current medications Recheck metabolic panel F/u in 6 months       Relevant Medications   glimepiride (AMARYL) 1 MG tablet   amLODipine (NORVASC) 10 MG tablet   lovastatin (MEVACOR) 40 MG tablet   Other Relevant Orders   Comprehensive metabolic panel     Respiratory   Chronic obstructive pulmonary disease (HCC)    Followed by pulm        Endocrine   Controlled type 2 diabetes mellitus with complication, without long-term current use of insulin (HCC)    Well controlled Am concerned for possible hypoglycemia Decrease glimepiride to once daily Referral for eye exam  Foot exam today Urine micro today On statin F/u in 3-6 months      Relevant Medications   glimepiride (AMARYL) 1 MG tablet   lovastatin (MEVACOR) 40 MG tablet   Diabetic polyneuropathy associated with type 2 diabetes mellitus (HCC)    Uncontrolled Increase gabapentin to 638m qhs      Relevant Medications   glimepiride (AMARYL) 1 MG tablet   traZODone (DESYREL) 150 MG tablet   gabapentin (NEURONTIN) 300 MG capsule   lovastatin (MEVACOR) 40 MG tablet   Hyperlipidemia associated with type 2 diabetes mellitus (HCC)    Uncontrolled on last check Continue statin and repatha Recheck FLP and CMP Goal LDL <70      Relevant Medications   glimepiride (AMARYL) 1 MG tablet   lovastatin (MEVACOR) 40 MG tablet   Other Relevant Orders   Comprehensive metabolic panel   Lipid panel     Genitourinary   Stage 3a chronic kidney disease (HCC)    Recheck metabolic panel Avoid NSAIDs       Relevant Orders   Comprehensive metabolic panel     Other   BMI 40.0-44.9, adult (HCC)    Discussed importance of healthy weight management Discussed diet and exercise       Relevant Medications   glimepiride (AMARYL) 1 MG tablet   Morbid obesity (HCC)    Discussed importance of healthy weight management Discussed diet and exercise       Relevant Medications   glimepiride (AMARYL) 1 MG tablet   Other Relevant Orders   Comprehensive metabolic panel   Chronic pain of both knees    Continue to work with Ortho Continue Hydrocodone as temporizing to knee replacement Consider pain management      Relevant Medications   traZODone (DESYREL) 150 MG tablet   gabapentin (NEURONTIN) 300 MG capsule   Chronic gout due to renal impairment without tophus    Continue allopurinol Recheck uric acid      Relevant Orders   Uric acid   GAD (generalized anxiety disorder)    Fairly well controlled Continue lexapro at current dose Increase trazodone to previous dose      Relevant Medications   traZODone (DESYREL) 150 MG tablet   Psychophysiological insomnia    Resume higher dose trazodone that was previously well controlling it       Other Visit Diagnoses    Encounter for annual physical exam    -  Primary   Relevant Orders   Comprehensive metabolic panel   Cologuard   Lipid panel   Uric acid   Colon cancer screening       Relevant Orders   Cologuard       Return in about 3 months (around 10/21/2020) for chronic disease f/u.      IFrederic JerichoMoorehead,acting as a sEducation administratorfor ALavon Paganini MD.,have documented all relevant documentation on the behalf of ALavon Paganini MD,as directed by  ALavon Paganini MD while in the presence of ALavon Paganini MD.  I, ALavon Paganini MD, have reviewed all documentation for this visit. The documentation on 07/21/20 for the exam, diagnosis, procedures, and orders are all accurate and complete.   Leyli Kevorkian, ADionne Bucy MD,  [facility]

## 2020-07-21 NOTE — Assessment & Plan Note (Signed)
Continue to work with Ortho Continue Hydrocodone as temporizing to knee replacement Consider pain management

## 2020-07-21 NOTE — Assessment & Plan Note (Signed)
Continue allopurinol  Recheck uric acid 

## 2020-07-21 NOTE — Patient Instructions (Signed)
Preventive Care 65 Years and Older, Male Preventive care refers to lifestyle choices and visits with your health care provider that can promote health and wellness. This includes:  A yearly physical exam. This is also called an annual wellness visit.  Regular dental and eye exams.  Immunizations.  Screening for certain conditions.  Healthy lifestyle choices, such as: ? Eating a healthy diet. ? Getting regular exercise. ? Not using drugs or products that contain nicotine and tobacco. ? Limiting alcohol use. What can I expect for my preventive care visit? Physical exam Your health care provider will check your:  Height and weight. These may be used to calculate your BMI (body mass index). BMI is a measurement that tells if you are at a healthy weight.  Heart rate and blood pressure.  Body temperature.  Skin for abnormal spots. Counseling Your health care provider may ask you questions about your:  Past medical problems.  Family's medical history.  Alcohol, tobacco, and drug use.  Emotional well-being.  Home life and relationship well-being.  Sexual activity.  Diet, exercise, and sleep habits.  History of falls.  Memory and ability to understand (cognition).  Work and work environment.  Access to firearms. What immunizations do I need? Vaccines are usually given at various ages, according to a schedule. Your health care provider will recommend vaccines for you based on your age, medical history, and lifestyle or other factors, such as travel or where you work.   What tests do I need? Blood tests  Lipid and cholesterol levels. These may be checked every 5 years, or more often depending on your overall health.  Hepatitis C test.  Hepatitis B test. Screening  Lung cancer screening. You may have this screening every year starting at age 55 if you have a 30-pack-year history of smoking and currently smoke or have quit within the past 15 years.  Colorectal  cancer screening. ? All adults should have this screening starting at age 50 and continuing until age 75. ? Your health care provider may recommend screening at age 45 if you are at increased risk. ? You will have tests every 1-10 years, depending on your results and the type of screening test.  Prostate cancer screening. Recommendations will vary depending on your family history and other risks.  Genital exam to check for testicular cancer or hernias.  Diabetes screening. ? This is done by checking your blood sugar (glucose) after you have not eaten for a while (fasting). ? You may have this done every 1-3 years.  Abdominal aortic aneurysm (AAA) screening. You may need this if you are a current or former smoker.  STD (sexually transmitted disease) testing, if you are at risk. Follow these instructions at home: Eating and drinking  Eat a diet that includes fresh fruits and vegetables, whole grains, lean protein, and low-fat dairy products. Limit your intake of foods with high amounts of sugar, saturated fats, and salt.  Take vitamin and mineral supplements as recommended by your health care provider.  Do not drink alcohol if your health care provider tells you not to drink.  If you drink alcohol: ? Limit how much you have to 0-2 drinks a day. ? Be aware of how much alcohol is in your drink. In the [place] one drink equals one 12 oz bottle of beer (355 mL), one 5 oz glass of wine (148 mL), or one 1 oz glass of hard liquor (44 mL).   Lifestyle  Take daily care of your teeth   and gums. Brush your teeth every morning and night with fluoride toothpaste. Floss one time each day.  Stay active. Exercise for at least 30 minutes 5 or more days each week.  Do not use any products that contain nicotine or tobacco, such as cigarettes, e-cigarettes, and chewing tobacco. If you need help quitting, ask your health care provider.  Do not use drugs.  If you are sexually active, practice safe sex.  Use a condom or other form of protection to prevent STIs (sexually transmitted infections).  Talk with your health care provider about taking a low-dose aspirin or statin.  Find healthy ways to cope with stress, such as: ? Meditation, yoga, or listening to music. ? Journaling. ? Talking to a trusted person. ? Spending time with friends and family. Safety  Always wear your seat belt while driving or riding in a vehicle.  Do not drive: ? If you have been drinking alcohol. Do not ride with someone who has been drinking. ? When you are tired or distracted. ? While texting.  Wear a helmet and other protective equipment during sports activities.  If you have firearms in your house, make sure you follow all gun safety procedures. What's next?  Visit your health care provider once a year for an annual wellness visit.  Ask your health care provider how often you should have your eyes and teeth checked.  Stay up to date on all vaccines. This information is not intended to replace advice given to you by your health care provider. Make sure you discuss any questions you have with your health care provider. Document Revised: 11/28/2018 Document Reviewed: 02/23/2018 Elsevier Patient Education  2021 Elsevier Inc.  

## 2020-07-21 NOTE — Addendum Note (Signed)
Addended by: Shawna Orleans on: 07/21/2020 02:49 PM   Modules accepted: Orders

## 2020-07-21 NOTE — Assessment & Plan Note (Signed)
Uncontrolled on last check Continue statin and repatha Recheck FLP and CMP Goal LDL <70

## 2020-07-21 NOTE — Assessment & Plan Note (Signed)
Well controlled Am concerned for possible hypoglycemia Decrease glimepiride to once daily Referral for eye exam Foot exam today Urine micro today On statin F/u in 3-6 months

## 2020-07-22 ENCOUNTER — Telehealth: Payer: Self-pay

## 2020-07-22 DIAGNOSIS — N289 Disorder of kidney and ureter, unspecified: Secondary | ICD-10-CM

## 2020-07-22 LAB — COMPREHENSIVE METABOLIC PANEL
ALT: 13 IU/L (ref 0–44)
AST: 12 IU/L (ref 0–40)
Albumin/Globulin Ratio: 1.5 (ref 1.2–2.2)
Albumin: 4.1 g/dL (ref 3.7–4.7)
Alkaline Phosphatase: 55 IU/L (ref 44–121)
BUN/Creatinine Ratio: 14 (ref 10–24)
BUN: 25 mg/dL (ref 8–27)
Bilirubin Total: 0.3 mg/dL (ref 0.0–1.2)
CO2: 21 mmol/L (ref 20–29)
Calcium: 9.3 mg/dL (ref 8.6–10.2)
Chloride: 102 mmol/L (ref 96–106)
Creatinine, Ser: 1.73 mg/dL — ABNORMAL HIGH (ref 0.76–1.27)
Globulin, Total: 2.7 g/dL (ref 1.5–4.5)
Glucose: 111 mg/dL — ABNORMAL HIGH (ref 65–99)
Potassium: 5.1 mmol/L (ref 3.5–5.2)
Sodium: 143 mmol/L (ref 134–144)
Total Protein: 6.8 g/dL (ref 6.0–8.5)
eGFR: 41 mL/min/{1.73_m2} — ABNORMAL LOW (ref >59)

## 2020-07-22 LAB — LIPID PANEL
Chol/HDL Ratio: 4.6 ratio (ref 0.0–5.0)
Cholesterol, Total: 156 mg/dL (ref 100–199)
HDL: 34 mg/dL — ABNORMAL LOW (ref >39)
LDL Chol Calc (NIH): 84 mg/dL (ref 0–99)
Triglycerides: 228 mg/dL — ABNORMAL HIGH (ref 0–149)
VLDL Cholesterol Cal: 38 mg/dL (ref 5–40)

## 2020-07-22 LAB — URIC ACID: Uric Acid: 6.9 mg/dL (ref 3.8–8.4)

## 2020-07-22 MED ORDER — LISINOPRIL 5 MG PO TABS: 5.0000 mg | ORAL_TABLET | Freq: Every day | ORAL | 3 refills | Status: DC

## 2020-07-22 NOTE — Telephone Encounter (Signed)
Pt advised.  He agreed to start lisinopril 5mg .  RX was sent to [facility].  Also lab sheet printing for BMP in 2-4 weeks.   Thanks,   -Mickel Baas

## 2020-07-22 NOTE — Telephone Encounter (Signed)
-----   Message from Virginia Crews, MD sent at 07/22/2020  8:16 AM EDT ----- Cholesterol has improved.  Uric acid is not quite to goal for preventing gout flares.  Could consider increasing allopurinol dose to 400 mg daily, but if he has been well controlled on the 300, despite the lab value, we could leave it alone for now.  There is a increase in his creatinine, which is a decrease in his kidney function.  I want to encourage him to avoid NSAIDs and hydrate well and we will recheck a BMP in 2 to 4 weeks.

## 2020-07-22 NOTE — Telephone Encounter (Signed)
Elevated urine microalbumin. This means that there is microscopic amounts of protein in his urine, likely from longstanding diabetes. Would recommend lisinopril 5 mg daily to protect kidneys from future damage. Okay to send in 90-day supply with 1 refill if patient agrees.

## 2020-07-30 DIAGNOSIS — Z1211 Encounter for screening for malignant neoplasm of colon: Secondary | ICD-10-CM | POA: Diagnosis not present

## 2020-07-30 LAB — COLOGUARD: Cologuard: POSITIVE — AB

## 2020-08-05 ENCOUNTER — Other Ambulatory Visit: Payer: Self-pay | Admitting: Family Medicine

## 2020-08-05 NOTE — Telephone Encounter (Signed)
Medication Refill - Medication: HYDROcodone-acetaminophen (NORCO) 5-325 MG tablet    Has the patient contacted their pharmacy? No. (Agent: If no, request that the patient contact the pharmacy for the refill.) Due to not having any refills   Preferred Pharmacy (with phone number or street name):  [id number] - [place], [place] - [place] Phone:  [phone]  Fax:  [phone]       Agent: Please be advised that RX refills may take up to 3 business days. We ask that you follow-up with your pharmacy.

## 2020-08-05 NOTE — Telephone Encounter (Signed)
Requested medication (s) are due for refill today: yes  Requested medication (s) are on the active medication list: yes  Last refill:  07/01/20  Future visit scheduled: yes  Notes to clinic:  not delegated   Requested Prescriptions  Pending Prescriptions Disp Refills   HYDROcodone-acetaminophen (NORCO) 5-325 MG tablet 90 tablet 0    Sig: Take 1 tablet by mouth every 8 (eight) hours as needed for moderate pain.      Not Delegated - Analgesics:  Opioid Agonist Combinations Failed - 08/05/2020 12:41 PM      Failed - This refill cannot be delegated      Failed - Urine Drug Screen completed in last 360 days      Passed - Valid encounter within last 6 months    Recent Outpatient Visits           2 weeks ago Encounter for annual physical exam   [facility] [place], Dionne Bucy, MD   3 months ago Sinus congestion   [place], [place], PA-C   3 months ago Chronic obstructive pulmonary disease, unspecified COPD type [facility])   [place], Kelby Aline, FNP       Future Appointments             In 1 month Ag[place]-Etang, Aaron Edelman, MD [facility]   In 2 months Bacigalupo, Dionne Bucy, MD [facility], [place]

## 2020-08-06 MED ORDER — HYDROCODONE-ACETAMINOPHEN 5-325 MG PO TABS: 1.0000 | ORAL_TABLET | Freq: Three times a day (TID) | ORAL | 0 refills | Status: DC | PRN

## 2020-08-13 ENCOUNTER — Telehealth: Payer: Self-pay | Admitting: Family Medicine

## 2020-08-13 DIAGNOSIS — R195 Other fecal abnormalities: Secondary | ICD-10-CM

## 2020-08-13 NOTE — Telephone Encounter (Signed)
Called to check if a fax was received for patient with an abnormal cologard result.  Please confirm and call to discuss at [phone], Case #[id number]

## 2020-08-14 NOTE — Telephone Encounter (Signed)
Have you seen results? KW

## 2020-08-14 NOTE — Telephone Encounter (Signed)
Patient advised.

## 2020-09-01 ENCOUNTER — Telehealth (INDEPENDENT_AMBULATORY_CARE_PROVIDER_SITE_OTHER): Payer: Medicare Other | Admitting: Gastroenterology

## 2020-09-01 DIAGNOSIS — R195 Other fecal abnormalities: Secondary | ICD-10-CM

## 2020-09-01 MED ORDER — SUTAB 1479-225-188 MG PO TABS: 12.0000 | ORAL_TABLET | Freq: Once | ORAL | 0 refills | Status: AC

## 2020-09-01 NOTE — Progress Notes (Signed)
Gastroenterology Pre-Procedure Review  Request Date: 09/23/20   Requesting Physician: Dr. Allen Norris  PATIENT REVIEW QUESTIONS: The patient responded to the following health history questions as indicated:    1. Are you having any GI issues? no 2. Do you have a personal history of Polyps? yes (positive cologuard test) 3. Do you have a family history of Colon Cancer or Polyps? no 4. Diabetes Mellitus? yes (Type 2) 5. Joint replacements in the past 12 months?no 6. Major health problems in the past 3 months?no 7. Any artificial heart valves, MVP, or defibrillator?no    MEDICATIONS & ALLERGIES:    Patient reports the following regarding taking any anticoagulation/antiplatelet therapy:   Plavix, Coumadin, Eliquis, Xarelto, Lovenox, Pradaxa, Brilinta, or Effient? yes (Plavix 75 mg) Aspirin? yes (aspirin 81 mg)  Patient confirms/reports the following medications:  Current Outpatient Medications  Medication Sig Dispense Refill   albuterol (VENTOLIN HFA) 108 (90 Base) MCG/ACT inhaler Inhale 2 puffs into the lungs every 6 (six) hours as needed for wheezing or shortness of breath. 8 g 2   allopurinol (ZYLOPRIM) 300 MG tablet Take 1 tablet (300 mg total) by mouth daily. 90 tablet 3   amLODipine (NORVASC) 10 MG tablet Take 1 tablet (10 mg total) by mouth daily. 90 tablet 1   aspirin EC 81 MG tablet Take 81 mg by mouth daily. Swallow whole.     blood glucose meter kit and supplies Dispense based on patient and insurance preference. Use up to four times daily as directed. (FOR ICD-10 E10.9, E11.9). check blood glucose fasting and before meals four times daily. 1 each 0   Cholecalciferol (VITAMIN D3 PO) Take 4,000 Units by mouth.     clopidogrel (PLAVIX) 75 MG tablet Take 75 mg by mouth daily.     Coenzyme Q10 (CO Q-10) 400 MG CAPS Take by mouth daily at 6 (six) AM.     escitalopram (LEXAPRO) 20 MG tablet Take 20 mg by mouth daily.     Evolocumab (REPATHA SURECLICK) 878 MG/ML SOAJ Inject 1 pen into the  skin every 14 (fourteen) days. 2 mL 11   fluticasone-salmeterol (ADVAIR HFA) 230-21 MCG/ACT inhaler Inhale 1-2 puffs into the lungs 2 (two) times daily. Rinse mouth after each use. 1 each 12   gabapentin (NEURONTIN) 300 MG capsule Take 2 capsules (600 mg total) by mouth at bedtime. 180 capsule 1   glimepiride (AMARYL) 1 MG tablet Take 1 tablet (1 mg total) by mouth daily with breakfast. 90 tablet 1   HYDROcodone-acetaminophen (NORCO) 5-325 MG tablet Take 1 tablet by mouth every 8 (eight) hours as needed for moderate pain. 90 tablet 0   lisinopril (ZESTRIL) 5 MG tablet Take 1 tablet (5 mg total) by mouth daily. 90 tablet 3   lovastatin (MEVACOR) 40 MG tablet Take 1 tablet (40 mg total) by mouth daily. 90 tablet 3   metoprolol tartrate (LOPRESSOR) 25 MG tablet Take 25 mg by mouth daily at 6 (six) AM.     Omega-3 Fatty Acids (FISH OIL) 1200 MG CAPS Take by mouth daily at 6 (six) AM.     traZODone (DESYREL) 150 MG tablet Take 1-2 tablets (150-300 mg total) by mouth at bedtime as needed for sleep. 180 tablet 1   No current facility-administered medications for this visit.    Patient confirms/reports the following allergies:  Allergies  Allergen Reactions   Stadol [Butorphanol] Itching    No orders of the defined types were placed in this encounter.   AUTHORIZATION INFORMATION Primary Insurance: 1D#:  Group #:  Secondary Insurance: 1D#: Group #:  SCHEDULE INFORMATION: Date: 09/23/20 Time: Location: [facility]

## 2020-09-05 ENCOUNTER — Ambulatory Visit: Payer: Medicare Other | Admitting: Cardiology

## 2020-09-05 ENCOUNTER — Encounter: Payer: Self-pay | Admitting: Cardiology

## 2020-09-05 ENCOUNTER — Other Ambulatory Visit: Payer: Self-pay

## 2020-09-05 VITALS — BP 116/64 | HR 56 | Ht 69.0 in | Wt 268.0 lb

## 2020-09-05 DIAGNOSIS — I2510 Atherosclerotic heart disease of native coronary artery without angina pectoris: Secondary | ICD-10-CM

## 2020-09-05 DIAGNOSIS — R0609 Other forms of dyspnea: Secondary | ICD-10-CM

## 2020-09-05 DIAGNOSIS — R0600 Dyspnea, unspecified: Secondary | ICD-10-CM | POA: Diagnosis not present

## 2020-09-05 DIAGNOSIS — E7800 Pure hypercholesterolemia, unspecified: Secondary | ICD-10-CM

## 2020-09-05 NOTE — Patient Instructions (Signed)
Medication Instructions:  No changes *If you need a refill on your cardiac medications before your next appointment, please call your pharmacy*   Lab Work: No changes  If you have labs (blood work) drawn today and your tests are completely normal, you will receive your results only by: [place] (if you have MyChart) OR A paper copy in the mail If you have any lab test that is abnormal or we need to change your treatment, we will call you to review the results.   Testing/Procedures: None ordered   Follow-Up: At [facility], you and your health needs are our priority.  As part of our continuing mission to provide you with exceptional heart care, we have created designated Provider Care Teams.  These Care Teams include your primary Cardiologist (physician) and Advanced Practice Providers (APPs -  Physician Assistants and Nurse Practitioners) who all work together to provide you with the care you need, when you need it.  We recommend signing up for the patient portal called "MyChart".  Sign up information is provided on this After Visit Summary.  MyChart is used to connect with patients for Virtual Visits (Telemedicine).  Patients are able to view lab/test results, encounter notes, upcoming appointments, etc.  Non-urgent messages can be sent to your provider as well.   To learn more about what you can do with MyChart, go to [email or web address].    Your next appointment:   6 month(s)  The format for your next appointment:   In Person  Provider:   Kate Sable, MD   Other Instructions

## 2020-09-05 NOTE — Progress Notes (Signed)
Cardiology Office Note:    Date:  09/05/2020   ID:  Tony Williams, DOB [birth year], MRN [id number]  PCP:  Virginia Crews, MD   [place]  Cardiologist:  None  Advanced Practice Provider:  No care team member to display Electrophysiologist:  None       Referring MD: Sharmon Leyden*   Chief Complaint  Patient presents with   Follow-up    3 months  Pt states he is doing well.     History of Present Illness:    Tony Williams is a 75 y.o. male with a hx of CAD/CABG x 4 2007, PCI x 2 2018, former smoker, possible COPD, OSA, obesity diabetes who presents for follow-up.  Being seen due to CAD, hyperlipidemia.  Repeat fasting lipid profile was obtained a month ago.  He states doing well.  Shortness of breath which is chronic deemed secondary to OSA, COPD.  Echo and Lexiscan Myoview did not show ischemia, EF was normal.  He feels well since his last visit, has lost about 12 to 18 pounds, his breathing is much improved, also feels much better.   Prior notes Echo 03/6604 normal systolic function, indeterminate diastolic function, EF 60 to 65%. Lexiscan Myoview, fixed perfusion defect, no evidence for ischemia. Patient recently moved to the area from [facility].  States having CAD/CABG x4 back in 2007, in 2018 had a myocardial infarction, had 2 stents placed.  He is intolerant to statins, has tried multiple statins and not able to tolerate due to myalgias.  Currently able to tolerate low-dose lovastatin.    Had sleep study in the past and was diagnosed with OSA, he declines using his CPAP mask.  Does not like the feel for the mask.    Past Medical History:  Diagnosis Date   Cancer ([place])    Chronic kidney disease    COPD (chronic obstructive pulmonary disease) ([place])    Depression    Diabetes mellitus without complication ([place])    H/O heart artery stent    Heart problem     Past Surgical History:  Procedure Laterality Date    ARTERIAL BYPASS SURGRY     EYE SURGERY     IR STENT PLACEMENT ANTE CAROTID INC ANGIO     KNEE SURGERY      Current Medications: Current Meds  Medication Sig   albuterol (VENTOLIN HFA) 108 (90 Base) MCG/ACT inhaler Inhale 2 puffs into the lungs every 6 (six) hours as needed for wheezing or shortness of breath.   allopurinol (ZYLOPRIM) 300 MG tablet Take 1 tablet (300 mg total) by mouth daily.   amLODipine (NORVASC) 10 MG tablet Take 1 tablet (10 mg total) by mouth daily.   aspirin EC 81 MG tablet Take 81 mg by mouth daily. Swallow whole.   blood glucose meter kit and supplies Dispense based on patient and insurance preference. Use up to four times daily as directed. (FOR ICD-10 E10.9, E11.9). check blood glucose fasting and before meals four times daily.   Cholecalciferol (VITAMIN D3 PO) Take 4,000 Units by mouth.   clopidogrel (PLAVIX) 75 MG tablet Take 75 mg by mouth daily.   Coenzyme Q10 (CO Q-10) 400 MG CAPS Take by mouth daily at 6 (six) AM.   escitalopram (LEXAPRO) 20 MG tablet Take 20 mg by mouth daily.   Evolocumab (REPATHA SURECLICK) 301 MG/ML SOAJ Inject 1 pen into the skin every 14 (fourteen) days.   fluticasone-salmeterol (ADVAIR HFA) 230-21 MCG/ACT inhaler  Inhale 1-2 puffs into the lungs 2 (two) times daily. Rinse mouth after each use.   gabapentin (NEURONTIN) 300 MG capsule Take 2 capsules (600 mg total) by mouth at bedtime.   glimepiride (AMARYL) 1 MG tablet Take 1 tablet (1 mg total) by mouth daily with breakfast.   HYDROcodone-acetaminophen (NORCO) 5-325 MG tablet Take 1 tablet by mouth every 8 (eight) hours as needed for moderate pain.   lisinopril (ZESTRIL) 5 MG tablet Take 1 tablet (5 mg total) by mouth daily.   lovastatin (MEVACOR) 40 MG tablet Take 1 tablet (40 mg total) by mouth daily.   metoprolol tartrate (LOPRESSOR) 25 MG tablet Take 25 mg by mouth daily at 6 (six) AM.   Omega-3 Fatty Acids (FISH OIL) 1200 MG CAPS Take by mouth daily at 6 (six) AM.   traZODone  (DESYREL) 150 MG tablet Take 1-2 tablets (150-300 mg total) by mouth at bedtime as needed for sleep.     Allergies:   Stadol [butorphanol]   Social History   Socioeconomic History   Marital status: Married    Spouse name: Not on file   Number of children: 2   Years of education: Not on file   Highest education level: High school graduate  Occupational History   Occupation: retired  Tobacco Use   Smoking status: Former    Pack years: 0.00    Types: Cigarettes    Quit date: 10/23/2003    Years since quitting: 16.8   Smokeless tobacco: Never  Vaping Use   Vaping Use: Never used  Substance and Sexual Activity   Alcohol use: Never   Drug use: Never   Sexual activity: Not on file  Other Topics Concern   Not on file  Social History Narrative   Not on file   Social Determinants of Health   Financial Resource Strain: Low Risk    Difficulty of Paying Living Expenses: Not hard at all  Food Insecurity: No Food Insecurity   Worried About Charity fundraiser in the Last Year: Never true   [place] in the Last Year: Never true  Transportation Needs: No Transportation Needs   Lack of Transportation (Medical): No   Lack of Transportation (Non-Medical): No  Physical Activity: Inactive   Days of Exercise per Week: 0 days   Minutes of Exercise per Session: 0 min  Stress: No Stress Concern Present   Feeling of Stress : Not at all  Social Connections: Moderately Isolated   Frequency of Communication with Friends and Family: More than three times a week   Frequency of Social Gatherings with Friends and Family: Three times a week   Attends Religious Services: Never   Active Member of Clubs or Organizations: No   Attends Archivist Meetings: Never   Marital Status: Married     Family History: The patient's family history includes Heart Problems in his father and mother.  ROS:   Please see the history of present illness.     All other systems reviewed and are  negative.  EKGs/Labs/Other Studies Reviewed:    The following studies were reviewed today:   EKG:  EKG not  ordered today.   Recent Labs: 04/22/2020: Hemoglobin 15.6; Platelets 221; TSH 4.100 07/21/2020: ALT 13; BUN 25; Creatinine, Ser 1.73; Potassium 5.1; Sodium 143  Recent Lipid Panel    Component Value Date/Time   CHOL 156 07/21/2020 1440   TRIG 228 (H) 07/21/2020 1440   HDL 34 (L) 07/21/2020 1440  CHOLHDL 4.6 07/21/2020 1440   LDLCALC 84 07/21/2020 1440     Risk Assessment/Calculations:      Physical Exam:    VS:  BP 116/64   Pulse (!) 56   Ht _0  (1.753 m)   Wt 268 lb (121.6 kg)   BMI 39.58 kg/m     Wt Readings from Last 3 Encounters:  09/05/20 268 lb (121.6 kg)  07/21/20 271 lb 11.2 oz (123.2 kg)  06/02/20 286 lb (129.7 kg)     GEN:  Well nourished, well developed in no acute distress HEENT: Normal NECK: No JVD; No carotid bruits LYMPHATICS: No lymphadenopathy CARDIAC: RRR, no murmurs, rubs, gallops RESPIRATORY: Decreased breath sounds at bases, ABDOMEN: Soft, non-tender, non-distended MUSCULOSKELETAL:  No edema; No deformity  SKIN: Warm and dry NEUROLOGIC:  Alert and oriented x 3 PSYCHIATRIC:  Normal affect   ASSESSMENT:    1. Dyspnea on exertion   2. Coronary artery disease involving native heart, unspecified vessel or lesion type, unspecified whether angina present   3. Pure hypercholesterolemia     PLAN:    In order of problems listed above:  Dyspnea on exertion, likely from morbid obesity,/deconditioning because symptoms are much improved with weight loss.  Prior echocardiogram showed normal systolic function, EF 60 to 65%, indeterminate diastolic function.  Lexiscan Myoview with no evidence for ischemia.  Large fixed inferior defect consistent with prior scar.  Patient advised to continue exercise and weight loss. History of CABG x4, PCI x2.  Echo with preserved EF, Lexiscan with fixed inferior defects consistent with prior MI.  Continue  aspirin/Plavix/statin. Hyperlipidemia, LDL not at goal.  Patient not tolerant to higher doses of statin.  Lipid panel much improved from prior.  Continue low-dose lovastatin.  Continue Repatha.    Follow-up in 6 months.      Medication Adjustments/Labs and Tests Ordered: Current medicines are reviewed at length with the patient today.  Concerns regarding medicines are outlined above.  No orders of the defined types were placed in this encounter.  No orders of the defined types were placed in this encounter.   Patient Instructions  Medication Instructions:  No changes *If you need a refill on your cardiac medications before your next appointment, please call your pharmacy*   Lab Work: No changes  If you have labs (blood work) drawn today and your tests are completely normal, you will receive your results only by: [place] (if you have MyChart) OR A paper copy in the mail If you have any lab test that is abnormal or we need to change your treatment, we will call you to review the results.   Testing/Procedures: None ordered   Follow-Up: At [facility], you and your health needs are our priority.  As part of our continuing mission to provide you with exceptional heart care, we have created designated Provider Care Teams.  These Care Teams include your primary Cardiologist (physician) and Advanced Practice Providers (APPs -  Physician Assistants and Nurse Practitioners) who all work together to provide you with the care you need, when you need it.  We recommend signing up for the patient portal called "MyChart".  Sign up information is provided on this After Visit Summary.  MyChart is used to connect with patients for Virtual Visits (Telemedicine).  Patients are able to view lab/test results, encounter notes, upcoming appointments, etc.  Non-urgent messages can be sent to your provider as well.   To learn more about what you can do with MyChart, go  to  [email or web address].    Your next appointment:   6 month(s)  The format for your next appointment:   In Person  Provider:   Kate Sable, MD   Other Instructions     Signed, Kate Sable, MD  09/05/2020 4:52 PM    [place]

## 2020-09-11 ENCOUNTER — Telehealth: Payer: Self-pay

## 2020-09-11 NOTE — Telephone Encounter (Signed)
Patient has been informed he may stop Plavix 5 days before procedure per Dr. Brita Romp. He is to restart 1 day after procedure. LVM detailed message for patient.

## 2020-09-16 ENCOUNTER — Other Ambulatory Visit: Payer: Self-pay | Admitting: Family Medicine

## 2020-09-16 NOTE — Telephone Encounter (Signed)
Medication Refill - Medication:HYDROcodone-acetaminophen ([place]) 5-325 MG tablet  Has the patient contacted their pharmacy? yes (Agent: If no, request that the patient contact the pharmacy for the refill.)  (Agent: If yes, when and what did the pharmacy advise?)contact pcp  Preferred Pharmacy (with phone number or street name):  [id number] - [place], [place] - [place] Phone:  [phone]  Fax:  [phone]      Agent: Please be advised that RX refills may take up to 3 business days. We ask that you follow-up with your pharmacy.

## 2020-09-16 NOTE — Telephone Encounter (Signed)
Requested medication (s) are due for refill today - yes  Requested medication (s) are on the active medication list -yes  Future visit scheduled -yes  Last refill: 08/06/20  Notes to clinic: Request Rf- non delegated Rx  Requested Prescriptions  Pending Prescriptions Disp Refills   HYDROcodone-acetaminophen (NORCO) 5-325 MG tablet 90 tablet 0    Sig: Take 1 tablet by mouth every 8 (eight) hours as needed for moderate pain.      Not Delegated - Analgesics:  Opioid Agonist Combinations Failed - 09/16/2020 11:54 AM      Failed - This refill cannot be delegated      Failed - Urine Drug Screen completed in last 360 days      Passed - Valid encounter within last 6 months    Recent Outpatient Visits           1 month ago Encounter for annual physical exam   [facility] [place], Dionne Bucy, MD   4 months ago Sinus congestion   [place], [place], PA-C   5 months ago Chronic obstructive pulmonary disease, unspecified COPD type [facility])   [place], Kelby Aline, FNP       Future Appointments             In 1 month Bacigalupo, Dionne Bucy, MD [facility]                 Requested Prescriptions  Pending Prescriptions Disp Refills   HYDROcodone-acetaminophen (NORCO) 5-325 MG tablet 90 tablet 0    Sig: Take 1 tablet by mouth every 8 (eight) hours as needed for moderate pain.      Not Delegated - Analgesics:  Opioid Agonist Combinations Failed - 09/16/2020 11:54 AM      Failed - This refill cannot be delegated      Failed - Urine Drug Screen completed in last 360 days      Passed - Valid encounter within last 6 months    Recent Outpatient Visits           1 month ago Encounter for annual physical exam   [facility] [place], Dionne Bucy, MD   4 months ago Sinus congestion   [place], [place], PA-C   5 months ago Chronic obstructive pulmonary disease,  unspecified COPD type [facility])   [place], Kelby Aline, FNP       Future Appointments             In 1 month Bacigalupo, Dionne Bucy, MD [facility], [place]

## 2020-09-19 MED ORDER — HYDROCODONE-ACETAMINOPHEN 5-325 MG PO TABS: 1.0000 | ORAL_TABLET | Freq: Three times a day (TID) | ORAL | 0 refills | Status: DC | PRN

## 2020-09-22 ENCOUNTER — Encounter: Payer: Self-pay | Admitting: Gastroenterology

## 2020-09-23 ENCOUNTER — Ambulatory Visit: Payer: Medicare Other | Admitting: Anesthesiology

## 2020-09-23 ENCOUNTER — Ambulatory Visit
Admission: RE | Admit: 2020-09-23 | Discharge: 2020-09-23 | Disposition: A | Discharge status: Home / Self Care | Payer: Medicare Other | Attending: Gastroenterology | Admitting: Gastroenterology

## 2020-09-23 ENCOUNTER — Encounter: Payer: Self-pay | Admitting: Gastroenterology

## 2020-09-23 ENCOUNTER — Encounter
Admission: RE | Disposition: A | Discharge status: Home / Self Care | Payer: Self-pay | Source: Home / Self Care | Attending: Gastroenterology

## 2020-09-23 DIAGNOSIS — Z7951 Long term (current) use of inhaled steroids: Secondary | ICD-10-CM | POA: Diagnosis not present

## 2020-09-23 DIAGNOSIS — K635 Polyp of colon: Secondary | ICD-10-CM | POA: Diagnosis not present

## 2020-09-23 DIAGNOSIS — D125 Benign neoplasm of sigmoid colon: Secondary | ICD-10-CM | POA: Diagnosis not present

## 2020-09-23 DIAGNOSIS — R195 Other fecal abnormalities: Secondary | ICD-10-CM

## 2020-09-23 DIAGNOSIS — K640 First degree hemorrhoids: Secondary | ICD-10-CM | POA: Insufficient documentation

## 2020-09-23 DIAGNOSIS — Z87891 Personal history of nicotine dependence: Secondary | ICD-10-CM | POA: Insufficient documentation

## 2020-09-23 DIAGNOSIS — D124 Benign neoplasm of descending colon: Secondary | ICD-10-CM | POA: Diagnosis not present

## 2020-09-23 DIAGNOSIS — F411 Generalized anxiety disorder: Secondary | ICD-10-CM | POA: Diagnosis not present

## 2020-09-23 DIAGNOSIS — Z7982 Long term (current) use of aspirin: Secondary | ICD-10-CM | POA: Insufficient documentation

## 2020-09-23 DIAGNOSIS — D123 Benign neoplasm of transverse colon: Secondary | ICD-10-CM | POA: Diagnosis not present

## 2020-09-23 DIAGNOSIS — Z885 Allergy status to narcotic agent status: Secondary | ICD-10-CM | POA: Diagnosis not present

## 2020-09-23 DIAGNOSIS — Z79899 Other long term (current) drug therapy: Secondary | ICD-10-CM | POA: Insufficient documentation

## 2020-09-23 DIAGNOSIS — Z7902 Long term (current) use of antithrombotics/antiplatelets: Secondary | ICD-10-CM | POA: Insufficient documentation

## 2020-09-23 DIAGNOSIS — Z1211 Encounter for screening for malignant neoplasm of colon: Secondary | ICD-10-CM | POA: Diagnosis not present

## 2020-09-23 DIAGNOSIS — Z7984 Long term (current) use of oral hypoglycemic drugs: Secondary | ICD-10-CM | POA: Diagnosis not present

## 2020-09-23 HISTORY — PX: COLONOSCOPY WITH PROPOFOL: SHX5780

## 2020-09-23 LAB — GLUCOSE, CAPILLARY: Glucose-Capillary: 134 mg/dL — ABNORMAL HIGH (ref 70–99)

## 2020-09-23 SURGERY — COLONOSCOPY WITH PROPOFOL
Anesthesia: General

## 2020-09-23 MED ORDER — GLYCOPYRROLATE 0.2 MG/ML IJ SOLN
INTRAMUSCULAR | Status: DC | PRN
  Administered 2020-09-23: .2 mg via INTRAVENOUS

## 2020-09-23 MED ORDER — PROPOFOL 10 MG/ML IV BOLUS
INTRAVENOUS | Status: DC | PRN
  Administered 2020-09-23: 70 mg via INTRAVENOUS

## 2020-09-23 MED ORDER — LIDOCAINE 2% (20 MG/ML) 5 ML SYRINGE
INTRAMUSCULAR | Status: DC | PRN
  Administered 2020-09-23: 25 mg via INTRAVENOUS

## 2020-09-23 MED ORDER — LIDOCAINE HCL (PF) 2 % IJ SOLN
INTRAMUSCULAR | Status: AC
  Filled 2020-09-23: qty 5

## 2020-09-23 MED ORDER — PROPOFOL 500 MG/50ML IV EMUL
INTRAVENOUS | Status: DC | PRN
  Administered 2020-09-23: 120 ug/kg/min via INTRAVENOUS

## 2020-09-23 MED ORDER — PROPOFOL 500 MG/50ML IV EMUL
INTRAVENOUS | Status: AC
  Filled 2020-09-23: qty 50

## 2020-09-23 MED ORDER — SODIUM CHLORIDE 0.9 % IV SOLN
INTRAVENOUS | Status: DC
  Administered 2020-09-23: 1000 mL via INTRAVENOUS

## 2020-09-23 NOTE — Anesthesia Postprocedure Evaluation (Signed)
Anesthesia Post Note  Patient: Tony Williams  Procedure(s) Performed: COLONOSCOPY WITH PROPOFOL  Patient location during evaluation: Endoscopy Anesthesia Type: General Level of consciousness: awake and alert Pain management: pain level controlled Vital Signs Assessment: post-procedure vital signs reviewed and stable Respiratory status: spontaneous breathing, nonlabored ventilation, respiratory function stable and patient connected to nasal cannula oxygen Cardiovascular status: blood pressure returned to baseline and stable Postop Assessment: no apparent nausea or vomiting Anesthetic complications: no   No notable events documented.   Last Vitals:  Vitals:   09/23/20 1053 09/23/20 1103  BP: 117/61 (!) 143/60  Pulse:    Resp:    Temp:    SpO2:      Last Pain:  Vitals:   09/23/20 1103  TempSrc:   PainSc: 0-No pain                 Precious Haws Noya Santarelli

## 2020-09-23 NOTE — Op Note (Signed)
[facility] Gastroenterology Patient Name: Tony Williams Procedure Date: 09/23/2020 10:12 AM MRN: [id number] Account #: [id number] Date of Birth: 02-Feb-1946 Admit Type: Outpatient Age: 75 Room: [facility] ENDO ROOM 4 Gender: Male Note Status: Finalized Procedure:             Colonoscopy Indications:           Positive Cologuard test Providers:             Lucilla Lame MD, MD Referring MD:          Dionne Bucy. Bacigalupo (Referring MD) Medicines:             Propofol per Anesthesia Complications:         No immediate complications. Procedure:             Pre-Anesthesia Assessment:                        - Prior to the procedure, a History and Physical was                         performed, and patient medications and allergies were                         reviewed. The patient's tolerance of previous                         anesthesia was also reviewed. The risks and benefits                         of the procedure and the sedation options and risks                         were discussed with the patient. All questions were                         answered, and informed consent was obtained. Prior                         Anticoagulants: The patient has taken no previous                         anticoagulant or antiplatelet agents. ASA Grade                         Assessment: II - A patient with mild systemic disease.                         After reviewing the risks and benefits, the patient                         was deemed in satisfactory condition to undergo the                         procedure.                        After obtaining informed consent, the colonoscope was  passed under direct vision. Throughout the procedure,                         the patient's blood pressure, pulse, and oxygen                         saturations were monitored continuously. The                         Colonoscope was introduced through the anus and                          advanced to the the cecum, identified by appendiceal                         orifice and ileocecal valve. The colonoscopy was                         performed without difficulty. The patient tolerated                         the procedure well. The quality of the bowel                         preparation was excellent. Findings:      The perianal and digital rectal examinations were normal.      Two sessile polyps were found in the transverse colon. The polyps were 3       to 5 mm in size. These polyps were removed with a cold snare. Resection       and retrieval were complete.      A 4 mm polyp was found in the descending colon. The polyp was sessile.       The polyp was removed with a cold snare. Resection and retrieval were       complete.      A 5 mm polyp was found in the sigmoid colon. The polyp was sessile. The       polyp was removed with a cold snare. Resection and retrieval were       complete.      Non-bleeding internal hemorrhoids were found during retroflexion. The       hemorrhoids were Grade I (internal hemorrhoids that do not prolapse). Impression:            - Two 3 to 5 mm polyps in the transverse colon,                         removed with a cold snare. Resected and retrieved.                        - One 4 mm polyp in the descending colon, removed with                         a cold snare. Resected and retrieved.                        - One 5 mm polyp in the sigmoid colon, removed with a  cold snare. Resected and retrieved.                        - Non-bleeding internal hemorrhoids. Recommendation:        - Discharge patient to home.                        - Resume previous diet.                        - Continue present medications.                        - Await pathology results.                        - Repeat colonoscopy is not recommended for                         surveillance. Procedure Code(s):     --- Professional  ---                        [phone], Colonoscopy, flexible; with removal of                         tumor(s), polyp(s), or other lesion(s) by snare                         technique Diagnosis Code(s):     --- Professional ---                        R19.5, Other fecal abnormalities                        K63.5, Polyp of colon CPT copyright 2019 American Medical Association. All rights reserved. The codes documented in this report are preliminary and upon coder review may  be revised to meet current compliance requirements. Lucilla Lame MD, MD 09/23/2020 10:32:01 AM This report has been signed electronically. Number of Addenda: 0 Note Initiated On: 09/23/2020 10:12 AM Scope Withdrawal Time: 0 hours 7 minutes 28 seconds  Total Procedure Duration: 0 hours 12 minutes 31 seconds  Estimated Blood Loss:  Estimated blood loss: none.      [facility]

## 2020-09-23 NOTE — Anesthesia Preprocedure Evaluation (Signed)
Anesthesia Evaluation  Patient identified by MRN, date of birth, ID band Patient awake    Reviewed: Allergy & Precautions, NPO status , Patient's Chart, lab work & pertinent test results  Airway Mallampati: III  TM Distance: <3 FB Neck ROM: full    Dental  (+) Chipped   Pulmonary COPD, former smoker,    Pulmonary exam normal        Cardiovascular Exercise Tolerance: Good hypertension, (-) angina+ CAD  (-) Past MI and (-) DOE Normal cardiovascular exam     Neuro/Psych PSYCHIATRIC DISORDERS  Neuromuscular disease    GI/Hepatic negative GI ROS, Neg liver ROS,   Endo/Other  diabetes, Type 2  Renal/GU Renal disease  negative genitourinary   Musculoskeletal   Abdominal   Peds  Hematology negative hematology ROS (+)   Anesthesia Other Findings Past Medical History: No date: Cancer ([place]) No date: Chronic kidney disease No date: COPD (chronic obstructive pulmonary disease) (HCC) No date: Depression No date: Diabetes mellitus without complication (HCC) No date: H/O heart artery stent No date: Heart problem  Past Surgical History: No date: ARTERIAL BYPASS SURGRY No date: EYE SURGERY No date: IR STENT PLACEMENT ANTE CAROTID INC ANGIO No date: KNEE SURGERY  BMI    Body Mass Index: 39.58 kg/m      Reproductive/Obstetrics negative OB ROS                             Anesthesia Physical Anesthesia Plan  ASA: 3  Anesthesia Plan: General   Post-op Pain Management:    Induction: Intravenous  PONV Risk Score and Plan: Propofol infusion and TIVA  Airway Management Planned: Natural Airway and Nasal Cannula  Additional Equipment:   Intra-op Plan:   Post-operative Plan:   Informed Consent: I have reviewed the patients History and Physical, chart, labs and discussed the procedure including the risks, benefits and alternatives for the proposed anesthesia with the patient or authorized  representative who has indicated his/her understanding and acceptance.     Dental Advisory Given  Plan Discussed with: Anesthesiologist, CRNA and Surgeon  Anesthesia Plan Comments: (Patient consented for risks of anesthesia including but not limited to:  - adverse reactions to medications - risk of airway placement if required - damage to eyes, teeth, lips or other oral mucosa - nerve damage due to positioning  - sore throat or hoarseness - Damage to heart, brain, nerves, lungs, other parts of body or loss of life  Patient voiced understanding.)        Anesthesia Quick Evaluation

## 2020-09-23 NOTE — Transfer of Care (Signed)
Immediate Anesthesia Transfer of Care Note  Patient: Tony Williams  Procedure(s) Performed: COLONOSCOPY WITH PROPOFOL  Patient Location: Endoscopy Unit  Anesthesia Type:General  Level of Consciousness: awake  Airway & Oxygen Therapy: Patient Spontanous Breathing  Post-op Assessment: Report given to RN and Post -op Vital signs reviewed and stable  Post vital signs: Reviewed  Last Vitals:  Vitals Value Taken Time  BP 86/57 09/23/20 1033  Temp    Pulse 70 09/23/20 1034  Resp 26 09/23/20 1034  SpO2 93 % 09/23/20 1034  Vitals shown include unvalidated device data.  Last Pain:  Vitals:   09/23/20 0947  TempSrc: Temporal  PainSc: 0-No pain         Complications: No notable events documented.

## 2020-09-23 NOTE — H&P (Signed)
Lucilla Lame, MD [address], [place] [address] Phone:[phone] Fax : [phone]  Primary Care Physician:  Virginia Crews, MD Primary Gastroenterologist:  Dr. Allen Norris  Pre-Procedure History & Physical: HPI:  Tony Williams is a 75 y.o. male is here for an colonoscopy.   Past Medical History:  Diagnosis Date   Cancer ([place])    Chronic kidney disease    COPD (chronic obstructive pulmonary disease) ([place])    Depression    Diabetes mellitus without complication ([place])    H/O heart artery stent    Heart problem     Past Surgical History:  Procedure Laterality Date   ARTERIAL BYPASS SURGRY     EYE SURGERY     IR STENT PLACEMENT ANTE CAROTID INC ANGIO     KNEE SURGERY      Prior to Admission medications   Medication Sig Start Date End Date Taking? Authorizing Provider  albuterol (VENTOLIN HFA) 108 (90 Base) MCG/ACT inhaler Inhale 2 puffs into the lungs every 6 (six) hours as needed for wheezing or shortness of breath. 04/18/20  Yes Flinchum, Kelby Aline, FNP  allopurinol (ZYLOPRIM) 300 MG tablet Take 1 tablet (300 mg total) by mouth daily. 07/21/20  Yes Bacigalupo, Dionne Bucy, MD  amLODipine (NORVASC) 10 MG tablet Take 1 tablet (10 mg total) by mouth daily. 07/21/20  Yes Bacigalupo, Dionne Bucy, MD  aspirin EC 81 MG tablet Take 81 mg by mouth daily. Swallow whole.   Yes [provider]  blood glucose meter kit and supplies Dispense based on patient and insurance preference. Use up to four times daily as directed. (FOR ICD-10 E10.9, E11.9). check blood glucose fasting and before meals four times daily. 04/18/20  Yes Flinchum, Kelby Aline, FNP  Cholecalciferol (VITAMIN D3 PO) Take 4,000 Units by mouth.   Yes [provider]  Coenzyme Q10 (CO Q-10) 400 MG CAPS Take by mouth daily at 6 (six) AM.   Yes [provider]  escitalopram (LEXAPRO) 20 MG tablet Take 20 mg by mouth daily. 04/08/20  Yes [provider]  Evolocumab (REPATHA  SURECLICK) 387 MG/ML SOAJ Inject 1 pen into the skin every 14 (fourteen) days. 05/05/20  Yes Kate Sable, MD  fluticasone-salmeterol (ADVAIR HFA) 230-21 MCG/ACT inhaler Inhale 1-2 puffs into the lungs 2 (two) times daily. Rinse mouth after each use. 04/18/20  Yes Flinchum, Kelby Aline, FNP  gabapentin (NEURONTIN) 300 MG capsule Take 2 capsules (600 mg total) by mouth at bedtime. 07/21/20  Yes Bacigalupo, Dionne Bucy, MD  glimepiride (AMARYL) 1 MG tablet Take 1 tablet (1 mg total) by mouth daily with breakfast. 07/21/20  Yes Bacigalupo, Dionne Bucy, MD  HYDROcodone-acetaminophen (NORCO) 5-325 MG tablet Take 1 tablet by mouth every 8 (eight) hours as needed for moderate pain. 09/19/20  Yes Bacigalupo, Dionne Bucy, MD  lisinopril (ZESTRIL) 5 MG tablet Take 1 tablet (5 mg total) by mouth daily. 07/22/20  Yes Bacigalupo, Dionne Bucy, MD  lovastatin (MEVACOR) 40 MG tablet Take 1 tablet (40 mg total) by mouth daily. 07/21/20  Yes Bacigalupo, Dionne Bucy, MD  metoprolol tartrate (LOPRESSOR) 25 MG tablet Take 25 mg by mouth daily at 6 (six) AM. 04/08/20  Yes [provider]  Omega-3 Fatty Acids (FISH OIL) 1200 MG CAPS Take by mouth daily at 6 (six) AM.   Yes [provider]  traZODone (DESYREL) 150 MG tablet Take 1-2 tablets (150-300 mg total) by mouth at bedtime as needed for sleep. 07/21/20  Yes Bacigalupo, Dionne Bucy,  MD  clopidogrel (PLAVIX) 75 MG tablet Take 75 mg by mouth daily. 04/08/20   [provider]    Allergies as of 09/01/2020 - Review Complete 07/21/2020  Allergen Reaction Noted   Stadol [butorphanol] Itching 05/13/2020    Family History  Problem Relation Age of Onset   Heart Problems Mother    Heart Problems Father     Social History   Socioeconomic History   Marital status: Married    Spouse name: Not on file   Number of children: 2   Years of education: Not on file   Highest education level: High school graduate  Occupational History   Occupation: retired  Tobacco Use    Smoking status: Former    Pack years: 0.00    Types: Cigarettes    Quit date: 10/23/2003    Years since quitting: 16.9   Smokeless tobacco: Never  Vaping Use   Vaping Use: Never used  Substance and Sexual Activity   Alcohol use: Never   Drug use: Never   Sexual activity: Not on file  Other Topics Concern   Not on file  Social History Narrative   Not on file   Social Determinants of Health   Financial Resource Strain: Low Risk    Difficulty of Paying Living Expenses: Not hard at all  Food Insecurity: No Food Insecurity   Worried About Charity fundraiser in the Last Year: Never true   [place] in the Last Year: Never true  Transportation Needs: No Transportation Needs   Lack of Transportation (Medical): No   Lack of Transportation (Non-Medical): No  Physical Activity: Inactive   Days of Exercise per Week: 0 days   Minutes of Exercise per Session: 0 min  Stress: No Stress Concern Present   Feeling of Stress : Not at all  Social Connections: Moderately Isolated   Frequency of Communication with Friends and Family: More than three times a week   Frequency of Social Gatherings with Friends and Family: Three times a week   Attends Religious Services: Never   Active Member of Clubs or Organizations: No   Attends Archivist Meetings: Never   Marital Status: Married  Human resources officer Violence: Not At Risk   Fear of Current or Ex-Partner: No   Emotionally Abused: No   Physically Abused: No   Sexually Abused: No    Review of Systems: See HPI, otherwise negative ROS  Physical Exam: BP (!) 152/71   Pulse 74   Temp (!) 96.8 F (36 C) (Temporal)   Resp (!) 22   Ht _0  (1.753 m)   Wt 121.6 kg   SpO2 94%   BMI 39.58 kg/m  General:   Alert,  pleasant and cooperative in NAD Head:  Normocephalic and atraumatic. Neck:  Supple; no masses or thyromegaly. Lungs:  Clear throughout to auscultation.    Heart:  Regular rate and rhythm. Abdomen:  Soft, nontender  and nondistended. Normal bowel sounds, without guarding, and without rebound.   Neurologic:  Alert and  oriented x4;  grossly normal neurologically.  Impression/Plan: Tony Williams is here for an colonoscopy to be performed for positive cologuard  Risks, benefits, limitations, and alternatives regarding  colonoscopy have been reviewed with the patient.  Questions have been answered.  All parties agreeable.   Lucilla Lame, MD  09/23/2020, 10:02 AM

## 2020-09-24 ENCOUNTER — Encounter: Payer: Self-pay | Admitting: Gastroenterology

## 2020-09-24 LAB — SURGICAL PATHOLOGY

## 2020-09-29 ENCOUNTER — Other Ambulatory Visit: Payer: Self-pay | Admitting: Family Medicine

## 2020-09-29 MED ORDER — CLOPIDOGREL BISULFATE 75 MG PO TABS: 75.0000 mg | ORAL_TABLET | Freq: Every day | ORAL | 1 refills | Status: DC

## 2020-09-29 MED ORDER — ESCITALOPRAM OXALATE 20 MG PO TABS: 20.0000 mg | ORAL_TABLET | Freq: Every day | ORAL | 1 refills | Status: DC

## 2020-09-29 NOTE — Telephone Encounter (Signed)
Copied from [place] [id number]. Topic: Quick Communication - Rx Refill/Question >> Sep 29, 2020 12:29 PM Lenon Curt, Everette A wrote: Medication: escitalopram (LEXAPRO) 20 MG tablet   metoprolol tartrate (LOPRESSOR) 25 MG tablet   clopidogrel (PLAVIX) 75 MG tablet   Has the patient contacted their pharmacy? No. (Agent: If no, request that the patient contact the pharmacy for the refill.) (Agent: If yes, when and what did the pharmacy advise?)  Preferred Pharmacy (with phone number or street name): [id number] - [address], [place] - [place]  Phone:  [phone] Fax:  [phone]   Agent: Please be advised that RX refills may take up to 3 business days. We ask that you follow-up with your pharmacy.

## 2020-09-29 NOTE — Telephone Encounter (Signed)
   Notes to clinic:  medication filled by a historical provider  Review for refill   Requested Prescriptions  Pending Prescriptions Disp Refills   clopidogrel (PLAVIX) 75 MG tablet      Sig: Take 1 tablet (75 mg total) by mouth daily.      Hematology: Antiplatelets - clopidogrel Failed - 09/29/2020 12:36 PM      Failed - Evaluate AST, ALT within 2 months of therapy initiation.      Passed - ALT in normal range and within 360 days    ALT  Date Value Ref Range Status  07/21/2020 13 0 - 44 IU/L Final          Passed - AST in normal range and within 360 days    AST  Date Value Ref Range Status  07/21/2020 12 0 - 40 IU/L Final          Passed - HCT in normal range and within 180 days    Hematocrit  Date Value Ref Range Status  04/22/2020 46.9 37.5 - 51.0 % Final          Passed - HGB in normal range and within 180 days    Hemoglobin  Date Value Ref Range Status  04/22/2020 15.6 13.0 - 17.7 g/dL Final          Passed - PLT in normal range and within 180 days    Platelets  Date Value Ref Range Status  04/22/2020 221 150 - 450 x10E3/uL Final          Passed - Valid encounter within last 6 months    Recent Outpatient Visits           2 months ago Encounter for annual physical exam   [facility] [place], Dionne Bucy, MD   4 months ago Sinus congestion   [place], [place], PA-C   5 months ago Chronic obstructive pulmonary disease, unspecified COPD type [facility])   [place], Kelby Aline, FNP       Future Appointments             In 3 weeks Bacigalupo, Dionne Bucy, MD [facility]               escitalopram (LEXAPRO) 20 MG tablet      Sig: Take 1 tablet (20 mg total) by mouth daily.      Psychiatry:  Antidepressants - SSRI Passed - 09/29/2020 12:36 PM      Passed - Valid encounter within last 6 months    Recent Outpatient Visits           2 months ago Encounter for annual  physical exam   [facility] [place], Dionne Bucy, MD   4 months ago Sinus congestion   [place], [place], PA-C   5 months ago Chronic obstructive pulmonary disease, unspecified COPD type [facility])   [place] Flinchum, Kelby Aline, FNP       Future Appointments             In 3 weeks Bacigalupo, Dionne Bucy, MD [facility] [place]

## 2020-10-21 ENCOUNTER — Other Ambulatory Visit: Payer: Self-pay

## 2020-10-21 ENCOUNTER — Ambulatory Visit (INDEPENDENT_AMBULATORY_CARE_PROVIDER_SITE_OTHER): Payer: Medicare Other | Admitting: Family Medicine

## 2020-10-21 ENCOUNTER — Encounter: Payer: Self-pay | Admitting: Family Medicine

## 2020-10-21 VITALS — BP 136/55 | HR 46 | Temp 98.4°F | Resp 15 | Wt 269.6 lb

## 2020-10-21 DIAGNOSIS — E1159 Type 2 diabetes mellitus with other circulatory complications: Secondary | ICD-10-CM | POA: Diagnosis not present

## 2020-10-21 DIAGNOSIS — M25562 Pain in left knee: Secondary | ICD-10-CM

## 2020-10-21 DIAGNOSIS — G8929 Other chronic pain: Secondary | ICD-10-CM

## 2020-10-21 DIAGNOSIS — F5104 Psychophysiologic insomnia: Secondary | ICD-10-CM

## 2020-10-21 DIAGNOSIS — N1831 Chronic kidney disease, stage 3a: Secondary | ICD-10-CM | POA: Diagnosis not present

## 2020-10-21 DIAGNOSIS — E1142 Type 2 diabetes mellitus with diabetic polyneuropathy: Secondary | ICD-10-CM | POA: Diagnosis not present

## 2020-10-21 DIAGNOSIS — I152 Hypertension secondary to endocrine disorders: Secondary | ICD-10-CM

## 2020-10-21 DIAGNOSIS — M25561 Pain in right knee: Secondary | ICD-10-CM

## 2020-10-21 DIAGNOSIS — F411 Generalized anxiety disorder: Secondary | ICD-10-CM

## 2020-10-21 LAB — POCT GLYCOSYLATED HEMOGLOBIN (HGB A1C): Hemoglobin A1C: 5.9 % — AB (ref 4.0–5.6)

## 2020-10-21 MED ORDER — HYDROCODONE-ACETAMINOPHEN 5-325 MG PO TABS: 1.0000 | ORAL_TABLET | Freq: Three times a day (TID) | ORAL | 0 refills | Status: DC | PRN

## 2020-10-21 NOTE — Progress Notes (Signed)
Established patient visit   Patient: Tony Williams   DOB: Jun 20, 1945   75 y.o. Male  MRN: [id number] Visit Date: 10/21/2020  Today's healthcare provider: [name], MD   Chief Complaint  Patient presents with   Diabetes   Hyperlipidemia   Anxiety   Subjective    HPI  Diabetes Mellitus Type II, Follow-up  Lab Results  Component Value Date   HGBA1C 5.9 (A) 10/21/2020   HGBA1C 6.4 (H) 04/22/2020   Wt Readings from Last 3 Encounters:  10/21/20 269 lb 9.6 oz (122.3 kg)  09/23/20 268 lb (121.6 kg)  09/05/20 268 lb (121.6 kg)   Last seen for diabetes 3 months ago.  Management since then includes Decrease glimepiride to once daily Referral for eye exam. He reports excellent compliance with treatment. He is not having side effects.  Symptoms: Yes fatigue No foot ulcerations  Yes appetite changes No nausea  Yes paresthesia of the feet  Yes polydipsia  Yes polyuria No visual disturbances   No vomiting     Home blood sugar records:  not being checked  Episodes of hypoglycemia? No    Current insulin regiment: none Most Recent Eye Exam: >15month Current exercise: none Current diet habits: well balanced  Pertinent Labs: Lab Results  Component Value Date   CHOL 156 07/21/2020   HDL 34 (L) 07/21/2020   LDLCALC 84 07/21/2020   TRIG 228 (H) 07/21/2020   CHOLHDL 4.6 07/21/2020   Lab Results  Component Value Date   NA 143 07/21/2020   K 5.1 07/21/2020   CREATININE 1.73 (H) 07/21/2020   GFRNONAA 44 (L) 04/22/2020   GFRAA 51 (L) 04/22/2020   GLUCOSE 111 (H) 07/21/2020     ---------------------------------------------------------------------------------------------------  Lipid/Cholesterol, Follow-up  Last lipid panel Other pertinent labs  Lab Results  Component Value Date   CHOL 156 07/21/2020   HDL 34 (L) 07/21/2020   LDLCALC 84 07/21/2020   TRIG 228 (H) 07/21/2020   CHOLHDL 4.6 07/21/2020   Lab Results  Component Value Date   ALT 13  07/21/2020   AST 12 07/21/2020   PLT 221 04/22/2020   TSH 4.100 04/22/2020     He was last seen for this 3 months ago.  Management since that visit includes none, Continue statin and repatha.  He reports excellent compliance with treatment. He is not having side effects.   Symptoms: No chest pain No chest pressure/discomfort  Yes dyspnea No lower extremity edema  Yes numbness or tingling of extremity No orthopnea  No palpitations No paroxysmal nocturnal dyspnea  No speech difficulty No syncope   Current diet: well balanced Current exercise: no regular exercise  The ASCVD Risk score ([place], et al., 2013) failed to calculate for the following reasons:   The patient has a prior MI or stroke diagnosis  ---------------------------------------------------------------------------------------------------  Anxiety, Follow-up  He was last seen for anxiety 3 months ago. Changes made at last visit include Continue lexapro at current dose Increase trazodone to previous dose (1565m  .   He reports excellent compliance with treatment. He reports good tolerance of treatment. He is not having side effects.   He feels his anxiety is moderate and Unchanged since last visit.  Symptoms: No chest pain No difficulty concentrating  Yes dizziness Yes fatigue  No feelings of losing control Yes insomnia  No irritable No palpitations  No panic attacks No racing thoughts  Yes shortness of breath No sweating  No tremors/shakes    GAD-7  Results No flowsheet data found.  PHQ-9 Scores PHQ9 SCORE ONLY 10/21/2020 05/13/2020  PHQ-9 Total Score 2 0    ---------------------------------------------------------------------------------------------------     Medications: Outpatient Medications Prior to Visit  Medication Sig   albuterol (VENTOLIN HFA) 108 (90 Base) MCG/ACT inhaler Inhale 2 puffs into the lungs every 6 (six) hours as needed for wheezing or shortness of breath.   allopurinol  (ZYLOPRIM) 300 MG tablet Take 1 tablet (300 mg total) by mouth daily.   amLODipine (NORVASC) 10 MG tablet Take 1 tablet (10 mg total) by mouth daily.   aspirin EC 81 MG tablet Take 81 mg by mouth daily. Swallow whole.   blood glucose meter kit and supplies Dispense based on patient and insurance preference. Use up to four times daily as directed. (FOR ICD-10 E10.9, E11.9). check blood glucose fasting and before meals four times daily.   Cholecalciferol (VITAMIN D3 PO) Take 4,000 Units by mouth.   clopidogrel (PLAVIX) 75 MG tablet Take 1 tablet (75 mg total) by mouth daily.   Coenzyme Q10 (CO Q-10) 400 MG CAPS Take by mouth daily at 6 (six) AM.   escitalopram (LEXAPRO) 20 MG tablet Take 1 tablet (20 mg total) by mouth daily.   Evolocumab (REPATHA SURECLICK) 330 MG/ML SOAJ Inject 1 pen into the skin every 14 (fourteen) days.   fluticasone-salmeterol (ADVAIR HFA) 230-21 MCG/ACT inhaler Inhale 1-2 puffs into the lungs 2 (two) times daily. Rinse mouth after each use.   gabapentin (NEURONTIN) 300 MG capsule Take 2 capsules (600 mg total) by mouth at bedtime.   lisinopril (ZESTRIL) 5 MG tablet Take 1 tablet (5 mg total) by mouth daily.   lovastatin (MEVACOR) 40 MG tablet Take 1 tablet (40 mg total) by mouth daily.   metoprolol tartrate (LOPRESSOR) 25 MG tablet Take 25 mg by mouth daily at 6 (six) AM.   Omega-3 Fatty Acids (FISH OIL) 1200 MG CAPS Take by mouth daily at 6 (six) AM.   traZODone (DESYREL) 150 MG tablet Take 1-2 tablets (150-300 mg total) by mouth at bedtime as needed for sleep.   [DISCONTINUED] glimepiride (AMARYL) 1 MG tablet Take 1 tablet (1 mg total) by mouth daily with breakfast.   [DISCONTINUED] HYDROcodone-acetaminophen (NORCO) 5-325 MG tablet Take 1 tablet by mouth every 8 (eight) hours as needed for moderate pain.   No facility-administered medications prior to visit.    Review of Systems per HPI     Objective    BP (!) 136/55   Pulse (!) 46   Temp 98.4 F (36.9 C) (Oral)    Resp 15   Wt 269 lb 9.6 oz (122.3 kg)   SpO2 90%   BMI 39.81 kg/m     Physical Exam Vitals reviewed.  Constitutional:      General: He is not in acute distress.    Appearance: Normal appearance. He is not diaphoretic.  HENT:     Head: Normocephalic and atraumatic.  Eyes:     General: No scleral icterus.    Conjunctiva/sclera: Conjunctivae normal.  Cardiovascular:     Rate and Rhythm: Normal rate and regular rhythm.     Pulses: Normal pulses.     Heart sounds: Normal heart sounds. No murmur heard. Pulmonary:     Effort: Pulmonary effort is normal. No respiratory distress.     Breath sounds: Normal breath sounds. No wheezing or rhonchi.  Abdominal:     General: There is no distension.     Palpations: Abdomen is soft.  Tenderness: There is no abdominal tenderness.  Musculoskeletal:     Cervical back: Neck supple.     Right lower leg: No edema.     Left lower leg: No edema.  Lymphadenopathy:     Cervical: No cervical adenopathy.  Skin:    General: Skin is warm and dry.     Capillary Refill: Capillary refill takes less than 2 seconds.     Findings: No rash.  Neurological:     Mental Status: He is alert and oriented to person, place, and time.     Cranial Nerves: No cranial nerve deficit.  Psychiatric:        Mood and Affect: Mood normal.        Behavior: Behavior normal.      Results for orders placed or performed in visit on 10/21/20  POCT glycosylated hemoglobin (Hb A1C)  Result Value Ref Range   Hemoglobin A1C 5.9 (A) 4.0 - 5.6 %   HbA1c POC (<> result, manual entry)     HbA1c, POC (prediabetic range)     HbA1c, POC (controlled diabetic range)      Assessment & Plan     Problem List Items Addressed This Visit       Cardiovascular and Mediastinum   Hypertension associated with diabetes ([place])    Well controlled Continue current medications Reviewed metabolic panel          Endocrine   Controlled type 2 diabetes mellitus with diabetic  polyneuropathy, without long-term current use of insulin ([place]) - Primary    Well controlled Denies hypoglycemia, but concerned D/c glimepiride Continue dietary and lifestyle changes F/u in 57mand repeat A1c       Relevant Orders   POCT glycosylated hemoglobin (Hb A1C) (Completed)   Diabetic polyneuropathy associated with type 2 diabetes mellitus (HCC)    Chronic and stbale Continue gabapetnin         Genitourinary   Stage 3a chronic kidney disease ([place]    Continue to monitor Avoid nephrotoxic meds         Other   Morbid obesity ([place]    BMI 39 and associated with DM, HTN, HLD Discussed importance of healthy weight management Discussed diet and exercise        Chronic pain of both knees    Continue hydrocodone  Consider pain management referral       Relevant Medications   HYDROcodone-acetaminophen (NORCO) 5-325 MG tablet   GAD (generalized anxiety disorder)    Chronic and well controlled  Continue lexapro at current dose       Psychophysiological insomnia    Fairly well controlled, but going to sleep late Failed melatonin         Return in about 3 months (around 01/21/2021) for chronic disease f/u.      I, ALavon Paganini MD, have reviewed all documentation for this visit. The documentation on 10/21/20 for the exam, diagnosis, procedures, and orders are all accurate and complete.   Tony Williams, ADionne Bucy MD, MPH [facility]

## 2020-10-21 NOTE — Assessment & Plan Note (Signed)
Chronic and stbale Continue gabapetnin

## 2020-10-21 NOTE — Assessment & Plan Note (Signed)
Continue to monitor Avoid nephrotoxic meds

## 2020-10-21 NOTE — Assessment & Plan Note (Signed)
Continue hydrocodone  Consider pain management referral

## 2020-10-21 NOTE — Assessment & Plan Note (Signed)
Well controlled Continue current medications Reviewed metabolic panel 

## 2020-10-21 NOTE — Assessment & Plan Note (Signed)
Well controlled Denies hypoglycemia, but concerned D/c glimepiride Continue dietary and lifestyle changes F/u in 22mand repeat A1c

## 2020-10-21 NOTE — Assessment & Plan Note (Signed)
Chronic and well controlled Continue lexapro at current dose 

## 2020-10-21 NOTE — Assessment & Plan Note (Signed)
Fairly well controlled, but going to sleep late Failed melatonin

## 2020-10-21 NOTE — Assessment & Plan Note (Addendum)
BMI 39 and associated with DM, HTN, HLD Discussed importance of healthy weight management Discussed diet and exercise

## 2020-11-08 ENCOUNTER — Inpatient Hospital Stay (HOSPITAL_COMMUNITY)
Admit: 2020-11-08 | Discharge: 2020-11-08 | Disposition: A | Discharge status: Home / Self Care | Payer: Medicare Other | Attending: Family Medicine | Admitting: Family Medicine

## 2020-11-08 ENCOUNTER — Encounter: Payer: Self-pay | Admitting: Family Medicine

## 2020-11-08 ENCOUNTER — Inpatient Hospital Stay: Payer: Medicare Other

## 2020-11-08 ENCOUNTER — Other Ambulatory Visit: Payer: Self-pay

## 2020-11-08 ENCOUNTER — Emergency Department: Payer: Medicare Other

## 2020-11-08 ENCOUNTER — Inpatient Hospital Stay: Admit: 2020-11-08 | Payer: Medicare Other

## 2020-11-08 ENCOUNTER — Inpatient Hospital Stay
Admission: EM | Admit: 2020-11-08 | Discharge: 2020-11-11 | DRG: 270 | Disposition: A | Discharge status: Other Acute Inpatient Hospital | Payer: Medicare Other | Attending: Internal Medicine | Admitting: Internal Medicine

## 2020-11-08 DIAGNOSIS — I130 Hypertensive heart and chronic kidney disease with heart failure and stage 1 through stage 4 chronic kidney disease, or unspecified chronic kidney disease: Secondary | ICD-10-CM | POA: Diagnosis present

## 2020-11-08 DIAGNOSIS — E1122 Type 2 diabetes mellitus with diabetic chronic kidney disease: Secondary | ICD-10-CM | POA: Diagnosis not present

## 2020-11-08 DIAGNOSIS — G4733 Obstructive sleep apnea (adult) (pediatric): Secondary | ICD-10-CM

## 2020-11-08 DIAGNOSIS — T82857A Stenosis of cardiac prosthetic devices, implants and grafts, initial encounter: Secondary | ICD-10-CM | POA: Diagnosis not present

## 2020-11-08 DIAGNOSIS — I509 Heart failure, unspecified: Secondary | ICD-10-CM | POA: Diagnosis not present

## 2020-11-08 DIAGNOSIS — Z20822 Contact with and (suspected) exposure to covid-19: Secondary | ICD-10-CM | POA: Diagnosis not present

## 2020-11-08 DIAGNOSIS — F32A Depression, unspecified: Secondary | ICD-10-CM | POA: Diagnosis not present

## 2020-11-08 DIAGNOSIS — T501X5A Adverse effect of loop [high-ceiling] diuretics, initial encounter: Secondary | ICD-10-CM | POA: Diagnosis present

## 2020-11-08 DIAGNOSIS — J9601 Acute respiratory failure with hypoxia: Secondary | ICD-10-CM | POA: Diagnosis not present

## 2020-11-08 DIAGNOSIS — J449 Chronic obstructive pulmonary disease, unspecified: Secondary | ICD-10-CM | POA: Diagnosis not present

## 2020-11-08 DIAGNOSIS — E785 Hyperlipidemia, unspecified: Secondary | ICD-10-CM | POA: Diagnosis present

## 2020-11-08 DIAGNOSIS — Z9119 Patient's noncompliance with other medical treatment and regimen: Secondary | ICD-10-CM | POA: Diagnosis not present

## 2020-11-08 DIAGNOSIS — E1169 Type 2 diabetes mellitus with other specified complication: Secondary | ICD-10-CM

## 2020-11-08 DIAGNOSIS — N179 Acute kidney failure, unspecified: Secondary | ICD-10-CM | POA: Diagnosis present

## 2020-11-08 DIAGNOSIS — M109 Gout, unspecified: Secondary | ICD-10-CM | POA: Diagnosis present

## 2020-11-08 DIAGNOSIS — I25119 Atherosclerotic heart disease of native coronary artery with unspecified angina pectoris: Secondary | ICD-10-CM | POA: Diagnosis present

## 2020-11-08 DIAGNOSIS — N1831 Chronic kidney disease, stage 3a: Secondary | ICD-10-CM | POA: Diagnosis not present

## 2020-11-08 DIAGNOSIS — Z7902 Long term (current) use of antithrombotics/antiplatelets: Secondary | ICD-10-CM

## 2020-11-08 DIAGNOSIS — I25110 Atherosclerotic heart disease of native coronary artery with unstable angina pectoris: Secondary | ICD-10-CM | POA: Diagnosis not present

## 2020-11-08 DIAGNOSIS — I25700 Atherosclerosis of coronary artery bypass graft(s), unspecified, with unstable angina pectoris: Secondary | ICD-10-CM | POA: Diagnosis not present

## 2020-11-08 DIAGNOSIS — Z955 Presence of coronary angioplasty implant and graft: Secondary | ICD-10-CM

## 2020-11-08 DIAGNOSIS — Y832 Surgical operation with anastomosis, bypass or graft as the cause of abnormal reaction of the patient, or of later complication, without mention of misadventure at the time of the procedure: Secondary | ICD-10-CM | POA: Diagnosis present

## 2020-11-08 DIAGNOSIS — I5033 Acute on chronic diastolic (congestive) heart failure: Secondary | ICD-10-CM

## 2020-11-08 DIAGNOSIS — I214 Non-ST elevation (NSTEMI) myocardial infarction: Secondary | ICD-10-CM | POA: Diagnosis not present

## 2020-11-08 DIAGNOSIS — Z79899 Other long term (current) drug therapy: Secondary | ICD-10-CM | POA: Diagnosis not present

## 2020-11-08 DIAGNOSIS — E1142 Type 2 diabetes mellitus with diabetic polyneuropathy: Secondary | ICD-10-CM | POA: Diagnosis not present

## 2020-11-08 DIAGNOSIS — R0902 Hypoxemia: Secondary | ICD-10-CM | POA: Diagnosis not present

## 2020-11-08 DIAGNOSIS — I152 Hypertension secondary to endocrine disorders: Secondary | ICD-10-CM | POA: Diagnosis not present

## 2020-11-08 DIAGNOSIS — I252 Old myocardial infarction: Secondary | ICD-10-CM | POA: Diagnosis not present

## 2020-11-08 DIAGNOSIS — E1159 Type 2 diabetes mellitus with other circulatory complications: Secondary | ICD-10-CM | POA: Diagnosis not present

## 2020-11-08 DIAGNOSIS — Z7982 Long term (current) use of aspirin: Secondary | ICD-10-CM

## 2020-11-08 DIAGNOSIS — J441 Chronic obstructive pulmonary disease with (acute) exacerbation: Secondary | ICD-10-CM | POA: Diagnosis not present

## 2020-11-08 DIAGNOSIS — E66813 Obesity, class 3: Secondary | ICD-10-CM | POA: Diagnosis present

## 2020-11-08 DIAGNOSIS — R079 Chest pain, unspecified: Secondary | ICD-10-CM | POA: Diagnosis not present

## 2020-11-08 DIAGNOSIS — Z87891 Personal history of nicotine dependence: Secondary | ICD-10-CM | POA: Diagnosis not present

## 2020-11-08 DIAGNOSIS — I10 Essential (primary) hypertension: Secondary | ICD-10-CM

## 2020-11-08 DIAGNOSIS — J9811 Atelectasis: Secondary | ICD-10-CM | POA: Diagnosis not present

## 2020-11-08 DIAGNOSIS — Z6841 Body Mass Index (BMI) 40.0 and over, adult: Secondary | ICD-10-CM | POA: Diagnosis not present

## 2020-11-08 DIAGNOSIS — I25810 Atherosclerosis of coronary artery bypass graft(s) without angina pectoris: Secondary | ICD-10-CM | POA: Diagnosis not present

## 2020-11-08 DIAGNOSIS — I517 Cardiomegaly: Secondary | ICD-10-CM | POA: Diagnosis not present

## 2020-11-08 DIAGNOSIS — I441 Atrioventricular block, second degree: Secondary | ICD-10-CM | POA: Diagnosis present

## 2020-11-08 DIAGNOSIS — I2510 Atherosclerotic heart disease of native coronary artery without angina pectoris: Secondary | ICD-10-CM | POA: Diagnosis not present

## 2020-11-08 DIAGNOSIS — E119 Type 2 diabetes mellitus without complications: Secondary | ICD-10-CM | POA: Diagnosis present

## 2020-11-08 DIAGNOSIS — Z951 Presence of aortocoronary bypass graft: Secondary | ICD-10-CM

## 2020-11-08 LAB — TROPONIN I (HIGH SENSITIVITY)
Troponin I (High Sensitivity): 376 ng/L (ref <18)
Troponin I (High Sensitivity): 455 ng/L (ref <18)

## 2020-11-08 LAB — COMPREHENSIVE METABOLIC PANEL
ALT: 11 U/L (ref 0–44)
AST: 16 U/L (ref 15–41)
Albumin: 3.7 g/dL (ref 3.5–5.0)
Alkaline Phosphatase: 50 U/L (ref 38–126)
Anion gap: 4 — ABNORMAL LOW (ref 5–15)
BUN: 27 mg/dL — ABNORMAL HIGH (ref 8–23)
CO2: 32 mmol/L (ref 22–32)
Calcium: 9.3 mg/dL (ref 8.9–10.3)
Chloride: 103 mmol/L (ref 98–111)
Creatinine, Ser: 1.73 mg/dL — ABNORMAL HIGH (ref 0.61–1.24)
GFR, Estimated: 41 mL/min — ABNORMAL LOW (ref >60)
Glucose, Bld: 176 mg/dL — ABNORMAL HIGH (ref 70–99)
Potassium: 4.7 mmol/L (ref 3.5–5.1)
Sodium: 139 mmol/L (ref 135–145)
Total Bilirubin: 0.7 mg/dL (ref 0.3–1.2)
Total Protein: 7.2 g/dL (ref 6.5–8.1)

## 2020-11-08 LAB — RESP PANEL BY RT-PCR (FLU A&B, COVID) ARPGX2
Influenza A by PCR: NEGATIVE
Influenza B by PCR: NEGATIVE
SARS Coronavirus 2 by RT PCR: NEGATIVE

## 2020-11-08 LAB — APTT: aPTT: 30 seconds (ref 24–36)

## 2020-11-08 LAB — CBC
HCT: 48.2 % (ref 39.0–52.0)
Hemoglobin: 15.7 g/dL (ref 13.0–17.0)
MCH: 30.9 pg (ref 26.0–34.0)
MCHC: 32.6 g/dL (ref 30.0–36.0)
MCV: 94.9 fL (ref 80.0–100.0)
Platelets: 202 10*3/uL (ref 150–400)
RBC: 5.08 MIL/uL (ref 4.22–5.81)
RDW: 15.2 % (ref 11.5–15.5)
WBC: 9.1 10*3/uL (ref 4.0–10.5)
nRBC: 0 % (ref 0.0–0.2)

## 2020-11-08 LAB — BRAIN NATRIURETIC PEPTIDE: B Natriuretic Peptide: 138.7 pg/mL — ABNORMAL HIGH (ref 0.0–100.0)

## 2020-11-08 LAB — PROTIME-INR
INR: 1.1 (ref 0.8–1.2)
Prothrombin Time: 13.9 seconds (ref 11.4–15.2)

## 2020-11-08 LAB — HEPARIN LEVEL (UNFRACTIONATED): Heparin Unfractionated: 0.28 IU/mL — ABNORMAL LOW (ref 0.30–0.70)

## 2020-11-08 MED ORDER — OMEGA-3-ACID ETHYL ESTERS 1 G PO CAPS
1.0000 g | ORAL_CAPSULE | Freq: Two times a day (BID) | ORAL | Status: DC
  Administered 2020-11-08 – 2020-11-11 (×7): 1 g via ORAL
  Filled 2020-11-08 (×7): qty 1

## 2020-11-08 MED ORDER — GABAPENTIN 300 MG PO CAPS
600.0000 mg | ORAL_CAPSULE | Freq: Every day | ORAL | Status: DC
  Administered 2020-11-08 – 2020-11-10 (×3): 600 mg via ORAL
  Filled 2020-11-08 (×3): qty 2

## 2020-11-08 MED ORDER — VITAMIN D3 25 MCG (1000 UNIT) PO TABS
4000.0000 [IU] | ORAL_TABLET | Freq: Every day | ORAL | Status: DC
  Administered 2020-11-08 – 2020-11-11 (×4): 4000 [IU] via ORAL
  Filled 2020-11-08 (×8): qty 4

## 2020-11-08 MED ORDER — CLOPIDOGREL BISULFATE 75 MG PO TABS
75.0000 mg | ORAL_TABLET | Freq: Every day | ORAL | Status: DC
  Administered 2020-11-08 – 2020-11-11 (×4): 75 mg via ORAL
  Filled 2020-11-08 (×4): qty 1

## 2020-11-08 MED ORDER — IOHEXOL 350 MG/ML SOLN
100.0000 mL | Freq: Once | INTRAVENOUS | Status: AC | PRN
  Administered 2020-11-08: 100 mL via INTRAVENOUS

## 2020-11-08 MED ORDER — ONDANSETRON HCL 4 MG/2ML IJ SOLN: 4.0000 mg | Freq: Four times a day (QID) | INTRAMUSCULAR | Status: DC | PRN

## 2020-11-08 MED ORDER — FENTANYL CITRATE PF 50 MCG/ML IJ SOSY
25.0000 ug | PREFILLED_SYRINGE | INTRAMUSCULAR | Status: DC | PRN
  Administered 2020-11-08: 25 ug via INTRAVENOUS
  Filled 2020-11-08: qty 0.5

## 2020-11-08 MED ORDER — MAGNESIUM HYDROXIDE 400 MG/5ML PO SUSP: 30.0000 mL | Freq: Every day | ORAL | Status: DC | PRN

## 2020-11-08 MED ORDER — ATORVASTATIN CALCIUM 80 MG PO TABS
80.0000 mg | ORAL_TABLET | Freq: Every day | ORAL | Status: DC
  Administered 2020-11-08 – 2020-11-11 (×4): 80 mg via ORAL
  Filled 2020-11-08 (×4): qty 1

## 2020-11-08 MED ORDER — NITROGLYCERIN 0.4 MG SL SUBL
0.4000 mg | SUBLINGUAL_TABLET | SUBLINGUAL | Status: DC | PRN
  Administered 2020-11-08 (×3): 0.4 mg via SUBLINGUAL
  Filled 2020-11-08 (×2): qty 1

## 2020-11-08 MED ORDER — ASPIRIN EC 81 MG PO TBEC
81.0000 mg | DELAYED_RELEASE_TABLET | Freq: Every day | ORAL | Status: DC
  Administered 2020-11-08 – 2020-11-11 (×4): 81 mg via ORAL
  Filled 2020-11-08 (×4): qty 1

## 2020-11-08 MED ORDER — ACETAMINOPHEN 650 MG RE SUPP: 650.0000 mg | Freq: Four times a day (QID) | RECTAL | Status: DC | PRN

## 2020-11-08 MED ORDER — ALBUTEROL SULFATE (2.5 MG/3ML) 0.083% IN NEBU: 3.0000 mL | INHALATION_SOLUTION | Freq: Four times a day (QID) | RESPIRATORY_TRACT | Status: DC | PRN

## 2020-11-08 MED ORDER — PERFLUTREN LIPID MICROSPHERE
1.0000 mL | INTRAVENOUS | Status: AC | PRN
  Administered 2020-11-08: 4.5 mL via INTRAVENOUS
  Filled 2020-11-08: qty 10

## 2020-11-08 MED ORDER — HEPARIN BOLUS VIA INFUSION
4000.0000 [IU] | Freq: Once | INTRAVENOUS | Status: AC
  Administered 2020-11-08: 4000 [IU] via INTRAVENOUS
  Filled 2020-11-08: qty 4000

## 2020-11-08 MED ORDER — ALLOPURINOL 300 MG PO TABS
300.0000 mg | ORAL_TABLET | Freq: Every day | ORAL | Status: DC
  Administered 2020-11-08 – 2020-11-11 (×4): 300 mg via ORAL
  Filled 2020-11-08 (×4): qty 1

## 2020-11-08 MED ORDER — HEPARIN (PORCINE) 25000 UT/250ML-% IV SOLN
1800.0000 [IU]/h | INTRAVENOUS | Status: DC
  Administered 2020-11-08: 1700 [IU]/h via INTRAVENOUS
  Administered 2020-11-08 – 2020-11-09 (×3): 1900 [IU]/h via INTRAVENOUS
  Administered 2020-11-10 – 2020-11-11 (×3): 1800 [IU]/h via INTRAVENOUS
  Filled 2020-11-08 (×7): qty 250

## 2020-11-08 MED ORDER — TRAZODONE HCL 100 MG PO TABS
150.0000 mg | ORAL_TABLET | Freq: Every evening | ORAL | Status: DC | PRN
  Administered 2020-11-08: 200 mg via ORAL
  Administered 2020-11-09 – 2020-11-10 (×2): 300 mg via ORAL
  Filled 2020-11-08: qty 2
  Filled 2020-11-08 (×2): qty 3

## 2020-11-08 MED ORDER — ONDANSETRON HCL 4 MG PO TABS: 4.0000 mg | ORAL_TABLET | Freq: Four times a day (QID) | ORAL | Status: DC | PRN

## 2020-11-08 MED ORDER — METOPROLOL TARTRATE 25 MG PO TABS
25.0000 mg | ORAL_TABLET | Freq: Every day | ORAL | Status: DC
  Administered 2020-11-09: 25 mg via ORAL
  Filled 2020-11-08 (×2): qty 1

## 2020-11-08 MED ORDER — AMLODIPINE BESYLATE 10 MG PO TABS
10.0000 mg | ORAL_TABLET | Freq: Every day | ORAL | Status: DC
  Administered 2020-11-08 – 2020-11-11 (×4): 10 mg via ORAL
  Filled 2020-11-08 (×4): qty 1

## 2020-11-08 MED ORDER — FUROSEMIDE 10 MG/ML IJ SOLN
40.0000 mg | Freq: Two times a day (BID) | INTRAMUSCULAR | Status: DC
  Administered 2020-11-08 – 2020-11-10 (×4): 40 mg via INTRAVENOUS
  Filled 2020-11-08 (×4): qty 4

## 2020-11-08 MED ORDER — FLUOCINONIDE 0.05 % EX CREA
TOPICAL_CREAM | Freq: Two times a day (BID) | CUTANEOUS | Status: DC
  Administered 2020-11-10: 1 via TOPICAL
  Filled 2020-11-08: qty 15

## 2020-11-08 MED ORDER — METOPROLOL TARTRATE 25 MG PO TABS: 25.0000 mg | ORAL_TABLET | Freq: Every day | ORAL | Status: DC

## 2020-11-08 MED ORDER — LISINOPRIL 5 MG PO TABS: 5.0000 mg | ORAL_TABLET | Freq: Every day | ORAL | Status: DC

## 2020-11-08 MED ORDER — NITROGLYCERIN 0.4 MG SL SUBL
0.4000 mg | SUBLINGUAL_TABLET | SUBLINGUAL | Status: DC | PRN
  Administered 2020-11-09 – 2020-11-11 (×3): 0.4 mg via SUBLINGUAL
  Filled 2020-11-08 (×5): qty 1

## 2020-11-08 MED ORDER — HYDROCODONE-ACETAMINOPHEN 5-325 MG PO TABS
1.0000 | ORAL_TABLET | Freq: Three times a day (TID) | ORAL | Status: DC | PRN
  Administered 2020-11-08 – 2020-11-09 (×3): 1 via ORAL
  Filled 2020-11-08 (×3): qty 1

## 2020-11-08 MED ORDER — ESCITALOPRAM OXALATE 20 MG PO TABS
20.0000 mg | ORAL_TABLET | Freq: Every day | ORAL | Status: DC
  Administered 2020-11-08 – 2020-11-11 (×4): 20 mg via ORAL
  Filled 2020-11-08 (×4): qty 1

## 2020-11-08 MED ORDER — CO Q-10 400 MG PO CAPS: ORAL_CAPSULE | Freq: Every day | ORAL | Status: DC

## 2020-11-08 MED ORDER — HEPARIN BOLUS VIA INFUSION
1800.0000 [IU] | Freq: Once | INTRAVENOUS | Status: AC
  Administered 2020-11-08: 1800 [IU] via INTRAVENOUS
  Filled 2020-11-08: qty 1800

## 2020-11-08 MED ORDER — ACETAMINOPHEN 325 MG PO TABS
650.0000 mg | ORAL_TABLET | Freq: Four times a day (QID) | ORAL | Status: DC | PRN
  Filled 2020-11-08: qty 2

## 2020-11-08 NOTE — Progress Notes (Addendum)
[place] for heparin infusion Indication: ACS/STEMI  Allergies  Allergen Reactions   Morphine    Simvastatin    Stadol [Butorphanol] Itching    Patient Measurements: Height: '5\' 9"'$  (175.3 cm) Weight: 123 kg (271 lb 1.6 oz) IBW/kg (Calculated) : 70.7 Heparin Dosing Weight: 122.5 kg  Vital Signs: Temp: 97.5 F (36.4 C) (08/27 1236) Temp Source: Oral (08/27 0411) BP: 115/52 (08/27 1236) Pulse Rate: 60 (08/27 1316)  Labs: Recent Labs    11/08/20 0418 11/08/20 0558 11/08/20 0616 11/08/20 1359  HGB 15.7  --   --   --   HCT 48.2  --   --   --   PLT 202  --   --   --   APTT  --  30  --   --   LABPROT  --  13.9  --   --   INR  --  1.1  --   --   HEPARINUNFRC  --   --   --  0.28*  CREATININE 1.73*  --   --   --   TROPONINIHS 376*  --  455*  --      Estimated Creatinine Clearance: 48.5 mL/min (A) (by C-G formula based on SCr of 1.73 mg/dL (H)).   Medical History: Past Medical History:  Diagnosis Date   Cancer ([place])    Chronic kidney disease    COPD (chronic obstructive pulmonary disease) ([place])    Depression    Diabetes mellitus without complication ([place])    H/O heart artery stent    Heart problem     Assessment: Pt is 75 yo male w/ hx of quad bypass & MI, presenting to ED with CP radiating to L side/arm & back and initial Troponin I = 376.   8/27'@1359'$ : HL 0.28 subtherapeutic  Goal of Therapy:  Heparin level 0.3-0.7 units/ml Monitor platelets by anticoagulation protocol: Yes   Plan:  Bolus 1800 units. Increase heparin infusion to 1900 units/hr Check HL 8 hr after infusion started CBC daily while on heparin.   Wynelle Cleveland, PharmD Pharmacy Resident  11/08/2020 3:27 PM'  Pearla Dubonnet, PharmD Clinical Pharmacist 11/08/2020 3:37 PM

## 2020-11-08 NOTE — ED Notes (Signed)
Pt reports he took 3 baby ASA at 6pm, and 2 baby ASA 1 hr PTA. Did not take any NTG PTA, as it is out of date. CP 7/10 currently

## 2020-11-08 NOTE — Progress Notes (Signed)
PHARMACIST - PHYSICIAN ORDER COMMUNICATION  CONCERNING: P&T Medication Policy on Herbal Medications  DESCRIPTION:  This patient's order for:  Co Q-10 CAPS  has been noted.  This product(s) is classified as an "herbal" or natural product. Due to a lack of definitive safety studies or FDA approval, nonstandard manufacturing practices, plus the potential risk of unknown drug-drug interactions while on inpatient medications, the Pharmacy and Therapeutics Committee does not permit the use of "herbal" or natural products of this type within [facility].   ACTION TAKEN: The pharmacy department is unable to verify this order at this time. Please reevaluate patient's clinical condition at discharge and address if the herbal or natural product(s) should be resumed at that time.   Renda Rolls, PharmD, [facility] 11/08/2020 6:38 AM

## 2020-11-08 NOTE — ED Notes (Signed)
RA sats down to 82% in triage - 3LNC applied, sats now up to 94%

## 2020-11-08 NOTE — ED Notes (Signed)
Patient transported to CT 

## 2020-11-08 NOTE — ED Triage Notes (Signed)
Pt in with central, dull cp that began 1.5 wks ago. Pain radiates to back, and L shoulder blade, with some L arm numbness present. Pt states lying flat increases his discomfort. Hx of quadruple bypass 2007, and MI 2017 w/stent placement. Having some nausea and sob today.

## 2020-11-08 NOTE — Consult Note (Addendum)
Cardiology Consultation:   Patient ID: Daily Crate MRN: [id number]; DOB: Jan 16, 1946  Admit date: 11/08/2020 Date of Consult: 11/08/2020  PCP:  Virginia Crews, MD   [place] Providers Cardiologist:  None        Patient Profile:   Fannie Alomar is a 75 y.o. male with a hx of CAD s/p CABG x4 in 2007, PCI x2 in 2018, former smoker, possible COPD, OSA, obesity (trying to lose weight), diabetes, CKD Stage 3,  who is being seen 11/08/2020 for the evaluation of NSTEMI at the request of Dr. Sidney Ace.  History of Present Illness:   Mr. Arndt is followed by Dr. Garen Lah for the above cardiac issues. Patient moved from [place], [place] in June 2021. He reported h/oCAD with CABG x4 in 2007 with subsequent MI in 2018 requiring stenting x 2. He has h/o statin intolerance due to myalgias. Currently able to tolerate low dose lovastatin. Now on Repatha. Prior smoker, quit over 20 years ago. No alcohol or drug use. He is very sedentary at baseline.  Sleep study in the past diagnosed with OSA but declines CPAP use. Does not like the mask  Last seen 09/05/20 and reported DOE suspected from obesity an deconditioning. Reported weight loss improved symptoms. No changes were made.   Echo 05/2020 showed normal LVSF, indeterminate diastolic function, EF 01-09%. Lexiscan 05/2020 showed prior fixed perfusion defect and possible moderate peri-infarct ischemia, intermediate risk study.   Presented to the ER 11/07/20 for chest pain. Reported intermittent chest pain for the last week. Could be at rest or on exertion. Pain is tightness in nature. Centralized and radiating down the left arm. Had associated SOB. NO nausea, vomiting diaphoresis. It would last up to an hour and then self-resolve. Never took SL NTG since rx was expired. Last night chest pain was persistent, 7/10 , so he came to the ER. Says pain is the same as prior MI. Denies recent LLE, orthopnea, pnd, fever chills.   In the ER BP 136/55, O2 84%  on RA, 97% on 2L O2. Labs showed Scr 1.73/BUN 27 (basleine), K+ 4.7, glucose 176, normal LFTs, BNP 138. Marland Kitchen HS trop 376>455. CBC unremarkable. Respiratory panel negative. EKG with SR with PVCs, no new ischemic changes. CXR with mild CHF. Started on IV heparin and given 2 SK NTG. Chest CTA showed no PE, small pleural empyema. He was admitted for further work-up.   Past Medical History:  Diagnosis Date   Cancer ([place])    Chronic kidney disease    COPD (chronic obstructive pulmonary disease) ([place])    Depression    Diabetes mellitus without complication ([place])    H/O heart artery stent    Heart problem     Past Surgical History:  Procedure Laterality Date   ARTERIAL BYPASS SURGRY     COLONOSCOPY WITH PROPOFOL N/A 09/23/2020   Procedure: COLONOSCOPY WITH PROPOFOL;  Surgeon: Lucilla Lame, MD;  Location: [facility] ENDOSCOPY;  Service: Endoscopy;  Laterality: N/A;   EYE SURGERY     IR STENT PLACEMENT ANTE CAROTID INC ANGIO     KNEE SURGERY       Home Medications:  Prior to Admission medications   Medication Sig Start Date End Date Taking? Authorizing Provider  albuterol (VENTOLIN HFA) 108 (90 Base) MCG/ACT inhaler Inhale 2 puffs into the lungs every 6 (six) hours as needed for wheezing or shortness of breath. 04/18/20  Yes Flinchum, Kelby Aline, FNP  allopurinol (ZYLOPRIM) 300 MG tablet Take 1 tablet (300 mg total) by mouth  daily. 07/21/20  Yes Bacigalupo, Dionne Bucy, MD  amLODipine (NORVASC) 10 MG tablet Take 1 tablet (10 mg total) by mouth daily. 07/21/20  Yes Bacigalupo, Dionne Bucy, MD  aspirin EC 81 MG tablet Take 81 mg by mouth daily. Swallow whole.   Yes [provider]  blood glucose meter kit and supplies Dispense based on patient and insurance preference. Use up to four times daily as directed. (FOR ICD-10 E10.9, E11.9). check blood glucose fasting and before meals four times daily. 04/18/20  Yes Flinchum, Kelby Aline, FNP  Cholecalciferol (VITAMIN D3 PO) Take 4,000 Units by mouth.   Yes [provider]  clopidogrel (PLAVIX) 75 MG tablet Take 1 tablet (75 mg total) by mouth daily. 09/29/20  Yes Bacigalupo, Dionne Bucy, MD  Coenzyme Q10 (CO Q-10) 400 MG CAPS Take by mouth daily at 6 (six) AM.   Yes [provider]  desoximetasone (TOPICORT) 0.25 % cream Apply 1 application topically 2 (two) times daily. 10/24/20  Yes [provider]  escitalopram (LEXAPRO) 20 MG tablet Take 1 tablet (20 mg total) by mouth daily. 09/29/20  Yes Bacigalupo, Dionne Bucy, MD  Evolocumab (REPATHA SURECLICK) 161 MG/ML SOAJ Inject 1 pen into the skin every 14 (fourteen) days. 05/05/20  Yes Kate Sable, MD  fluticasone-salmeterol (ADVAIR HFA) 230-21 MCG/ACT inhaler Inhale 1-2 puffs into the lungs 2 (two) times daily. Rinse mouth after each use. 04/18/20  Yes Flinchum, Kelby Aline, FNP  gabapentin (NEURONTIN) 300 MG capsule Take 2 capsules (600 mg total) by mouth at bedtime. 07/21/20  Yes Bacigalupo, Dionne Bucy, MD  HYDROcodone-acetaminophen (NORCO) 5-325 MG tablet Take 1 tablet by mouth every 8 (eight) hours as needed for moderate pain. 10/21/20  Yes Bacigalupo, Dionne Bucy, MD  lisinopril (ZESTRIL) 5 MG tablet Take 1 tablet (5 mg total) by mouth daily. 07/22/20  Yes Bacigalupo, Dionne Bucy, MD  lovastatin (MEVACOR) 40 MG tablet Take 1 tablet (40 mg total) by mouth daily. 07/21/20  Yes Bacigalupo, Dionne Bucy, MD  metoprolol tartrate (LOPRESSOR) 25 MG tablet Take 25 mg by mouth daily at 6 (six) AM. 04/08/20  Yes [provider]  Omega-3 Fatty Acids (FISH OIL) 1200 MG CAPS Take by mouth daily at 6 (six) AM.   Yes [provider]  traZODone (DESYREL) 150 MG tablet Take 1-2 tablets (150-300 mg total) by mouth at bedtime as needed for sleep. 07/21/20  Yes Bacigalupo, Dionne Bucy, MD    Inpatient Medications: Scheduled Meds:  allopurinol  300 mg Oral Daily   amLODipine  10 mg Oral Daily   aspirin EC  81 mg Oral Daily   atorvastatin  80 mg Oral Daily   cholecalciferol  4,000 Units Oral Daily    clopidogrel  75 mg Oral Daily   escitalopram  20 mg Oral Daily   fluocinonide cream   Topical BID   furosemide  40 mg Intravenous Q12H   gabapentin  600 mg Oral QHS   metoprolol tartrate  25 mg Oral Daily   omega-3 acid ethyl esters  1 g Oral BID   Continuous Infusions:  heparin 1,700 Units/hr (11/08/20 0555)   PRN Meds: acetaminophen **OR** acetaminophen, albuterol, fentaNYL (SUBLIMAZE) injection, HYDROcodone-acetaminophen, magnesium hydroxide, nitroGLYCERIN, ondansetron **OR** ondansetron (ZOFRAN) IV, traZODone  Allergies:    Allergies  Allergen Reactions   Morphine    Simvastatin    Stadol [Butorphanol] Itching    Social History:   Social History   Socioeconomic History   Marital status: Married    Spouse name: Not  on file   Number of children: 2   Years of education: Not on file   Highest education level: High school graduate  Occupational History   Occupation: retired  Tobacco Use   Smoking status: Former    Types: Cigarettes    Quit date: 10/23/2003    Years since quitting: 17.0   Smokeless tobacco: Never  Vaping Use   Vaping Use: Never used  Substance and Sexual Activity   Alcohol use: Never   Drug use: Never   Sexual activity: Not on file  Other Topics Concern   Not on file  Social History Narrative   Not on file   Social Determinants of Health   Financial Resource Strain: Low Risk    Difficulty of Paying Living Expenses: Not hard at all  Food Insecurity: No Food Insecurity   Worried About Charity fundraiser in the Last Year: Never true   [place] in the Last Year: Never true  Transportation Needs: No Transportation Needs   Lack of Transportation (Medical): No   Lack of Transportation (Non-Medical): No  Physical Activity: Inactive   Days of Exercise per Week: 0 days   Minutes of Exercise per Session: 0 min  Stress: No Stress Concern Present   Feeling of Stress : Not at all  Social Connections: Moderately Isolated   Frequency of  Communication with Friends and Family: More than three times a week   Frequency of Social Gatherings with Friends and Family: Three times a week   Attends Religious Services: Never   Active Member of Clubs or Organizations: No   Attends Archivist Meetings: Never   Marital Status: Married  Human resources officer Violence: Not At Risk   Fear of Current or Ex-Partner: No   Emotionally Abused: No   Physically Abused: No   Sexually Abused: No    Family History:    Family History  Problem Relation Age of Onset   Heart Problems Mother    Heart Problems Father      ROS:  Please see the history of present illness.   All other ROS reviewed and negative.     Physical Exam/Data:   Vitals:   11/08/20 0630 11/08/20 0711 11/08/20 0713 11/08/20 0744  BP: (!) 128/57 (!) 129/56 (!) 121/57 (!) 124/58  Pulse: (!) 50 66 63 60  Resp: 11 16 16    Temp:    97.7 F (36.5 C)  TempSrc:      SpO2: 98% 94% 94% 91%  Weight:    123 kg  Height:    5' 9"  (1.753 m)   No intake or output data in the 24 hours ending 11/08/20 0850 Last 3 Weights 11/08/2020 11/08/2020 10/21/2020  Weight (lbs) 271 lb 1.6 oz 270 lb 269 lb 9.6 oz  Weight (kg) 122.97 kg 122.471 kg 122.29 kg     Body mass index is 40.03 kg/m.  General:  Well nourished, well developed, in no acute distress HEENT: normal Lymph: no adenopathy Neck: no JVD Endocrine:  No thryomegaly Vascular: No carotid bruits; FA pulses 2+ bilaterally without bruits  Cardiac:  normal S1, S2; RRR; no murmur  Lungs:  clear to auscultation bilaterally, no wheezing, rhonchi or rales  Abd: soft, nontender, no hepatomegaly  Ext: no edema Musculoskeletal:  No deformities, BUE and BLE strength normal and equal Skin: warm and dry  Neuro:  CNs 2-12 intact, no focal abnormalities noted Psych:  Normal affect   EKG:  The EKG was personally reviewed  and demonstrates:  SR, 72bpm PVCs, TWI latera leads (old), possible anterior infarct old Telemetry:  Telemetry was  personally reviewed and demonstrates:  SB, HR 50s, ?fluctuating Qt interval- Giovanny Dugal review with MD  Relevant CV Studies:  Echo 05/2020  1. Left ventricular ejection fraction, by estimation, is 60 to 65%. The  left ventricle has normal function. The left ventricle has no regional  wall motion abnormalities. Left ventricular diastolic parameters are  indeterminate.   2. Right ventricular systolic function is low normal. The right  ventricular size is not well visualized.   3. The mitral valve is normal in structure. No evidence of mitral valve  regurgitation.   4. The aortic valve was not well visualized. Aortic valve regurgitation  is trivial.   Myoview Lexiscan 05/2020 Narrative & Impression  T wave inversion was noted during stress in the aVL leads. There was no ST segment deviation noted during stress. Defect 1: There is a large defect of severe severity present in the basal inferior, basal inferolateral, mid inferior and mid inferolateral location. Findings consistent with prior myocardial infarction with moderate peri-infarct ischemia. This is an intermediate risk study. Nuclear stress EF: 45%. CT attenuation images show evidence of moderate aortic and coronary calcifications.    This result has not been signed. Information might be incomplete.    Laboratory Data:  High Sensitivity Troponin:   Recent Labs  Lab 11/08/20 0418 11/08/20 0616  TROPONINIHS 376* 455*     Chemistry Recent Labs  Lab 11/08/20 0418  NA 139  K 4.7  CL 103  CO2 32  GLUCOSE 176*  BUN 27*  CREATININE 1.73*  CALCIUM 9.3  GFRNONAA 41*  ANIONGAP 4*    Recent Labs  Lab 11/08/20 0418  PROT 7.2  ALBUMIN 3.7  AST 16  ALT 11  ALKPHOS 50  BILITOT 0.7   Hematology Recent Labs  Lab 11/08/20 0418  WBC 9.1  RBC 5.08  HGB 15.7  HCT 48.2  MCV 94.9  MCH 30.9  MCHC 32.6  RDW 15.2  PLT 202   BNP Recent Labs  Lab 11/08/20 0558  BNP 138.7*    DDimer No results for input(s): DDIMER in  the last 168 hours.   Radiology/Studies:  CT Angio Chest PE W and/or Wo Contrast  Result Date: 11/08/2020 CLINICAL DATA:  75 year old male with central chest pain radiating to the back and left shoulder blade. EXAM: CT ANGIOGRAPHY CHEST WITH CONTRAST TECHNIQUE: Multidetector CT imaging of the chest was performed using the standard protocol during bolus administration of intravenous contrast. Multiplanar CT image reconstructions and MIPs were obtained to evaluate the vascular anatomy. CONTRAST:  139m OMNIPAQUE IOHEXOL 350 MG/ML SOLN COMPARISON:  Portable chest 0442 hours today. FINDINGS: Cardiovascular: Good contrast bolus timing in the pulmonary arterial tree. No focal filling defect identified in the pulmonary arteries to suggest acute pulmonary embolism. Prior sternotomy. Calcified aortic atherosclerosis. Cardiomegaly. No pericardial effusion. Mediastinum/Nodes: Postoperative changes with no mediastinal mass or lymphadenopathy. Maximal and mildly asymmetric right hilar lymph nodes. Lungs/Pleura: Volume loss in the right hemithorax with posterior and costophrenic angle pleural thickening and/or intermediate density small pleural collection (series 4, image 84) with faint calcification. Associated right lower lobe basal segment atelectasis and mild architectural distortion. Major airways remain patent with atelectatic changes. Scattered subpleural scarring in both lungs. No left pleural effusion. Upper Abdomen: Absent gallbladder. Negative visible noncontrast liver, spleen, pancreas, adrenal glands and bowel in the upper abdomen. Partially visible atrophied left kidney. Musculoskeletal: Chronic but ununited sternotomy. T12  wedge compression fracture with largely obliterated T11-T12 disc space, but the T11 inferior endplate remains intact. Interbody ankylosis there and at other thoracic levels in part related to flowing endplate osteophytes. No acute or suspicious osseous lesion identified. Review of the MIP  images confirms the above findings. IMPRESSION: 1. Negative for acute pulmonary embolus. 2. Right lung volume loss with lower lobe fibrothorax, or less likely small pleural empyema. Associated right lung atelectasis and architectural distortion. Mild atelectasis and pulmonary scarring elsewhere. 3. Cardiomegaly.  Aortic Atherosclerosis (ICD10-I70.0). 4. Chronic T12 compression fracture. Electronically Signed   By: Genevie Ann M.D.   On: 11/08/2020 07:30   DG Chest Port 1 View  Result Date: 11/08/2020 CLINICAL DATA:  Chest pain and hypoxemia EXAM: PORTABLE CHEST 1 VIEW COMPARISON:  None. FINDINGS: Previous median sternotomy for CABG procedure. Mild cardiac enlargement. Mild diffuse interstitial edema identified. No airspace opacities. Small right pleural effusion suspected. IMPRESSION: Mild congestive heart failure. Electronically Signed   By: Kerby Moors M.D.   On: 11/08/2020 05:23     Assessment and Plan:   NSTEMI CAD s/p CABGx4 in 2007 with MI in 2018 with stenting x2 - presented with chest pain similar to prior MI with associated SOB. Pain improve with SL NTG. Found to have HS trop 376>455, mildly elevated BNP,  Kidney function around baseline 1.7. Started on IV heparin.  - EKG with no new ischemic changes - continue to trend troponin - echo 05/2020 with normal LVSF, no WMA, indeterminate diastolic dysfunction. Myoview lexiscan with prior infarct with moderate peri-infarct ischemia, intermediate risk study - echo ordered - continue PTA Aspirin, plavix, Lopressor 77m daily, statin - Patient reports dull chest pain - given CKD not the best candidate for cardiac cath, continue IV heparin for 48 hours. Consider adding long-acting nitrate. Mohini Heathcock discuss with MD.   Mild CHF - BNP 138 and CXR with mild CHF - IV lasix 479mBID - echo 05/2020 with normal LVEF and indeterminate diastolic function - echo repeated as above - No I/Os recorded - Patient on 2L O2 - On exam does not appear significantly  volume overloaded, may not need much more IV diuresis  HLD - h/o intolerance to statins, but started on atorvastatin 8084mn admission>>can trial this, but would recommend PTA lovastatin given history - LDL 84 07/2020 - continue repatha - Ajla Mcgeachy re-check lipid panel  HTN - amlodipine 76m36mily and Lopressor 25mg60mly - PTA lisinopril held - Bps wnl  CKD stage 3 - Scr 1.73 on admission, which appears to be around baseline - lisinopril held - monitor Scr/BUN with lasix  COPD exacerbation - duonebs per IM  DM2 - a1C 5.9 10/2020 - SSII per IM  OSA - not compliant with CPAP  For questions or updates, please contact [facility] Please consult [email or web address] for contact info under    Signed, Cadence H FurNinfa MeekerC  11/08/2020 8:50 AM   I have seen and examined this patient with Cadence Furth.  Agree with above, note added to reflect my findings.  On exam, RRR, no murmurs.  Patient presented to the hospital with chest pain, found to have a non-STEMI.  He has been having chest pain over the last 2 weeks.  Chest pain has been stuttering over that time.  He does have shortness of breath, but this is only when he has been having pain.  Over the last 2 days, shortness of breath worsened.  Yesterday, he developed chest pain that was persistent and thus presented to  the emergency room.  He was given nitroglycerin with improvement in his symptoms.  Troponins were found to be elevated in the emergency room.  He is currently on heparin and is chest pain-free.  We Alvetta Hidrogo continue with current management.  We Adrean Findlay treat him with IV heparin for 48 hours.  He does have CKD stage III.  We Dayton Sherr continue to monitor his kidney function, and if it improves by Monday, we Margaux Engen likely plan for left heart catheterization.  If not, could consider medical management.  Yehoshua Vitelli M. Merik Mignano MD 11/08/2020 1:34 PM

## 2020-11-08 NOTE — Progress Notes (Signed)
*  PRELIMINARY RESULTS* Echocardiogram 2D Echocardiogram has been performed. Definity IV Contrast used on this study.  Claretta Fraise 11/08/2020, 1:57 PM

## 2020-11-08 NOTE — Progress Notes (Signed)
PROGRESS NOTE    Tony Williams  [id number] DOB: 29-Jun-1945 DOA: 11/08/2020 PCP: Virginia Crews, MD   Chief complaint.  Chest pain. Brief Narrative:  Tony Williams is a 75 y.o. Caucasian male with medical history significant for coronary artery disease status post four-vessel CABG, status post PCI and 2 stents, COPD, depression, type 2 diabetes mellitus and stage IIIa chronic kidney disease, who presented to emergency room with acute onset of midsternal chest pain.  EKG had no ST elevation, peak troponin 455.  He has been seen by cardiology, currently on heparin drip.   Assessment & Plan:   Active Problems:   NSTEMI (non-ST elevated myocardial infarction) ([place])  #1.  Non-STEMI. Coronary artery disease with history of CABG and PCI. Continue heparin drip, continue aspirin Plavix, beta-blocker. Patient has been eval by cardiology.  Pending decision about further work-up.  2.  Likely acute on chronic diastolic congestive heart failure. Echo pending. Continue IV Lasix for 24 hours, may discontinue tomorrow.  3.  Type 2 diabetes with chronic kidney disease stage IIIa. Continue current regimen.  Renal function stable  4.  COPD Continue current regimen.      DVT prophylaxis: Heparin Code Status: full Family Communication: wife updated at bedside Disposition Plan:    Status is: Inpatient  Remains inpatient appropriate because:IV treatments appropriate due to intensity of illness or inability to take PO and Inpatient level of care appropriate due to severity of illness  Dispo: The patient is from: Home              Anticipated d/c is to: Home              Patient currently is not medically stable to d/c.   Difficult to place patient No        No intake/output data recorded. No intake/output data recorded.     Consultants:  Cardiology  Procedures: None  Antimicrobials: None   Subjective: Patient doing well today, no additional chest pain or  shortness of breath. No abdominal pain nausea vomiting.  No dysuria hematuria.  Objective: Vitals:   11/08/20 0711 11/08/20 0713 11/08/20 0744 11/08/20 1236  BP: (!) 129/56 (!) 121/57 (!) 124/58 (!) 115/52  Pulse: 66 63 60 (!) 58  Resp: '16 16  17  '$ Temp:   97.7 F (36.5 C) (!) 97.5 F (36.4 C)  TempSrc:      SpO2: 94% 94% 91% 94%  Weight:   123 kg   Height:   '5\' 9"'$  (1.753 m)    No intake or output data in the 24 hours ending 11/08/20 1253 Filed Weights   11/08/20 0424 11/08/20 0744  Weight: 122.5 kg 123 kg    Examination:  General exam: Appears calm and comfortable  Respiratory system: Clear to auscultation. Respiratory effort normal. Cardiovascular system: S1 & S2 heard, RRR. No JVD, murmurs, rubs, gallops or clicks. No pedal edema. Gastrointestinal system: Abdomen is nondistended, soft and nontender. No organomegaly or masses felt. Normal bowel sounds heard. Central nervous system: Alert and oriented. No focal neurological deficits. Extremities: Symmetric 5 x 5 power. Skin: No rashes, lesions or ulcers Psychiatry: Judgement and insight appear normal. Mood & affect appropriate.     Data Reviewed: I have personally reviewed following labs and imaging studies  CBC: Recent Labs  Lab 11/08/20 0418  WBC 9.1  HGB 15.7  HCT 48.2  MCV 94.9  PLT 123XX123   Basic Metabolic Panel: Recent Labs  Lab 11/08/20 0418  NA 139  K 4.7  CL 103  CO2 32  GLUCOSE 176*  BUN 27*  CREATININE 1.73*  CALCIUM 9.3   GFR: Estimated Creatinine Clearance: 48.5 mL/min (A) (by C-G formula based on SCr of 1.73 mg/dL (H)). Liver Function Tests: Recent Labs  Lab 11/08/20 0418  AST 16  ALT 11  ALKPHOS 50  BILITOT 0.7  PROT 7.2  ALBUMIN 3.7   No results for input(s): LIPASE, AMYLASE in the last 168 hours. No results for input(s): AMMONIA in the last 168 hours. Coagulation Profile: Recent Labs  Lab 11/08/20 0558  INR 1.1   Cardiac Enzymes: No results for input(s): CKTOTAL, CKMB,  CKMBINDEX, TROPONINI in the last 168 hours. BNP (last 3 results) No results for input(s): PROBNP in the last 8760 hours. HbA1C: No results for input(s): HGBA1C in the last 72 hours. CBG: No results for input(s): GLUCAP in the last 168 hours. Lipid Profile: No results for input(s): CHOL, HDL, LDLCALC, TRIG, CHOLHDL, LDLDIRECT in the last 72 hours. Thyroid Function Tests: No results for input(s): TSH, T4TOTAL, FREET4, T3FREE, THYROIDAB in the last 72 hours. Anemia Panel: No results for input(s): VITAMINB12, FOLATE, FERRITIN, TIBC, IRON, RETICCTPCT in the last 72 hours. Sepsis Labs: No results for input(s): PROCALCITON, LATICACIDVEN in the last 168 hours.  Recent Results (from the past 240 hour(s))  Resp Panel by RT-PCR (Flu A&B, Covid) Nasopharyngeal Swab     Status: None   Collection Time: 11/08/20  4:39 AM   Specimen: Nasopharyngeal Swab; Nasopharyngeal(NP) swabs in vial transport medium  Result Value Ref Range Status   SARS Coronavirus 2 by RT PCR NEGATIVE NEGATIVE Final    Comment: (NOTE) SARS-CoV-2 target nucleic acids are NOT DETECTED.  The SARS-CoV-2 RNA is generally detectable in upper respiratory specimens during the acute phase of infection. The lowest concentration of SARS-CoV-2 viral copies this assay can detect is 138 copies/mL. A negative result does not preclude SARS-Cov-2 infection and should not be used as the sole basis for treatment or other patient management decisions. A negative result may occur with  improper specimen collection/handling, submission of specimen other than nasopharyngeal swab, presence of viral mutation(s) within the areas targeted by this assay, and inadequate number of viral copies(<138 copies/mL). A negative result must be combined with clinical observations, patient history, and epidemiological information. The expected result is Negative.  Fact Sheet for Patients:  EntrepreneurPulse.com.au  Fact Sheet for Healthcare  Providers:  IncredibleEmployment.be  This test is no t yet approved or cleared by the [place] FDA and  has been authorized for detection and/or diagnosis of SARS-CoV-2 by FDA under an Emergency Use Authorization (EUA). This EUA will remain  in effect (meaning this test can be used) for the duration of the COVID-19 declaration under Section 564(b)(1) of the Act, 21 U.S.C.section 360bbb-3(b)(1), unless the authorization is terminated  or revoked sooner.       Influenza A by PCR NEGATIVE NEGATIVE Final   Influenza B by PCR NEGATIVE NEGATIVE Final    Comment: (NOTE) The Xpert Xpress SARS-CoV-2/FLU/RSV plus assay is intended as an aid in the diagnosis of influenza from Nasopharyngeal swab specimens and should not be used as a sole basis for treatment. Nasal washings and aspirates are unacceptable for Xpert Xpress SARS-CoV-2/FLU/RSV testing.  Fact Sheet for Patients: EntrepreneurPulse.com.au  Fact Sheet for Healthcare Providers: IncredibleEmployment.be  This test is not yet approved or cleared by the [place] FDA and has been authorized for detection and/or diagnosis of SARS-CoV-2 by FDA under an Emergency Use Authorization (EUA). This EUA  will remain in effect (meaning this test can be used) for the duration of the COVID-19 declaration under Section 564(b)(1) of the Act, 21 U.S.C. section 360bbb-3(b)(1), unless the authorization is terminated or revoked.  Performed at [facility], [address] [place], [place] [address]          Radiology Studies: CT Angio Chest PE W and/or Wo Contrast  Result Date: 11/08/2020 CLINICAL DATA:  75 year old male with central chest pain radiating to the back and left shoulder blade. EXAM: CT ANGIOGRAPHY CHEST WITH CONTRAST TECHNIQUE: Multidetector CT imaging of the chest was performed using the standard protocol during bolus administration of intravenous contrast.  Multiplanar CT image reconstructions and MIPs were obtained to evaluate the vascular anatomy. CONTRAST:  145m OMNIPAQUE IOHEXOL 350 MG/ML SOLN COMPARISON:  Portable chest 0442 hours today. FINDINGS: Cardiovascular: Good contrast bolus timing in the pulmonary arterial tree. No focal filling defect identified in the pulmonary arteries to suggest acute pulmonary embolism. Prior sternotomy. Calcified aortic atherosclerosis. Cardiomegaly. No pericardial effusion. Mediastinum/Nodes: Postoperative changes with no mediastinal mass or lymphadenopathy. Maximal and mildly asymmetric right hilar lymph nodes. Lungs/Pleura: Volume loss in the right hemithorax with posterior and costophrenic angle pleural thickening and/or intermediate density small pleural collection (series 4, image 84) with faint calcification. Associated right lower lobe basal segment atelectasis and mild architectural distortion. Major airways remain patent with atelectatic changes. Scattered subpleural scarring in both lungs. No left pleural effusion. Upper Abdomen: Absent gallbladder. Negative visible noncontrast liver, spleen, pancreas, adrenal glands and bowel in the upper abdomen. Partially visible atrophied left kidney. Musculoskeletal: Chronic but ununited sternotomy. T12 wedge compression fracture with largely obliterated T11-T12 disc space, but the T11 inferior endplate remains intact. Interbody ankylosis there and at other thoracic levels in part related to flowing endplate osteophytes. No acute or suspicious osseous lesion identified. Review of the MIP images confirms the above findings. IMPRESSION: 1. Negative for acute pulmonary embolus. 2. Right lung volume loss with lower lobe fibrothorax, or less likely small pleural empyema. Associated right lung atelectasis and architectural distortion. Mild atelectasis and pulmonary scarring elsewhere. 3. Cardiomegaly.  Aortic Atherosclerosis (ICD10-I70.0). 4. Chronic T12 compression fracture.  Electronically Signed   By: HGenevie AnnM.D.   On: 11/08/2020 07:30   DG Chest Port 1 View  Result Date: 11/08/2020 CLINICAL DATA:  Chest pain and hypoxemia EXAM: PORTABLE CHEST 1 VIEW COMPARISON:  None. FINDINGS: Previous median sternotomy for CABG procedure. Mild cardiac enlargement. Mild diffuse interstitial edema identified. No airspace opacities. Small right pleural effusion suspected. IMPRESSION: Mild congestive heart failure. Electronically Signed   By: TKerby MoorsM.D.   On: 11/08/2020 05:23        Scheduled Meds:  allopurinol  300 mg Oral Daily   amLODipine  10 mg Oral Daily   aspirin EC  81 mg Oral Daily   atorvastatin  80 mg Oral Daily   cholecalciferol  4,000 Units Oral Daily   clopidogrel  75 mg Oral Daily   escitalopram  20 mg Oral Daily   fluocinonide cream   Topical BID   furosemide  40 mg Intravenous Q12H   gabapentin  600 mg Oral QHS   metoprolol tartrate  25 mg Oral Daily   omega-3 acid ethyl esters  1 g Oral BID   Continuous Infusions:  heparin 1,700 Units/hr (11/08/20 0555)     LOS: 0 days    Time spent: No charge    DSharen Hones MD [facility]   To contact the attending provider  between 7A-7P or the covering provider during after hours 7P-7A, please log into the web site [email or web address] and access using universal [place] password for that web site. If you do not have the password, please call the hospital operator.  11/08/2020, 12:53 PM

## 2020-11-08 NOTE — H&P (Addendum)
[place]   PATIENT NAME: Nicholos Aloisi    MR#:  [id number]  DATE OF BIRTH:  22-Minsa Weddington-1947  DATE OF ADMISSION:  11/08/2020  PRIMARY CARE PHYSICIAN: Virginia Crews, MD   Patient is coming from: Home  REQUESTING/REFERRING PHYSICIAN: Hinda Kehr, MD  CHIEF COMPLAINT:   Chief Complaint  Patient presents with  . Chest Pain    HISTORY OF PRESENT ILLNESS:  Freddie Dymek is a 75 y.o. Caucasian male with medical history significant for coronary artery disease status post four-vessel CABG, status post PCI and 2 stents, COPD, depression, type 2 diabetes mellitus and stage IIIa chronic kidney disease, who presented to emergency room with acute onset of midsternal chest pain felt as pressure and tightness which has been intermittent over the last week and a half and gastric with dyspnea as well as nausea without vomiting.  Since yesterday his pain has been almost constant.  He graded 8/10 in severity and it was radiating to his left shoulder and left arm.  He denies any cough or wheezing or hemoptysis.  No leg pain or worsening edema or recent travels or surgeries.  No fever or chills.  No bleeding diathesis.  His pain has been helped with 2 sublingual nitroglycerin that were given in the ER.  ED Course: When he came to the ER blood pressure was 136/55 with a heart rate of 4661 pulse ox entry was down to 84% on room air and 97% on 2 L O2 by nasal cannula.  Labs revealed a blood glucose of 176 and a BUN of 27 with creatinine 1.73 close to his baseline.  High-sensitivity troponin was 376.  CBC was normal.  Respiratory panel is currently pending. EKG as reviewed by me : Showed sinus rhythm with a rate of 72 with PVCs with widened QRS and ST/T wave abnormality with T wave inversion laterally and flattening inferiorly Imaging: Chest x-ray showed mild CHF.  The patient was given IV heparin bolus and drip and 2 sublingual nitroglycerin.  Chest CTA is currently pending.  The patient will  be admitted to a progressive unit bed for further evaluation and management. PAST MEDICAL HISTORY:   Past Medical History:  Diagnosis Date  . Cancer ([place])   . Chronic kidney disease   . COPD (chronic obstructive pulmonary disease) ([place])   . Depression   . Diabetes mellitus without complication ([place])   . H/O heart artery stent   . Heart problem   Coronary artery disease  PAST SURGICAL HISTORY:   Past Surgical History:  Procedure Laterality Date  . ARTERIAL BYPASS SURGRY    . COLONOSCOPY WITH PROPOFOL N/A 09/23/2020   Procedure: COLONOSCOPY WITH PROPOFOL;  Surgeon: Lucilla Lame, MD;  Location: [facility];  Service: Endoscopy;  Laterality: N/A;  . EYE SURGERY    . IR STENT PLACEMENT ANTE CAROTID INC ANGIO    . KNEE SURGERY    Cardiac PCI and 2 stents.  SOCIAL HISTORY:   Social History   Tobacco Use  . Smoking status: Former    Types: Cigarettes    Quit date: 10/23/2003    Years since quitting: 17.0  . Smokeless tobacco: Never  Substance Use Topics  . Alcohol use: Never    FAMILY HISTORY:   Family History  Problem Relation Age of Onset  . Heart Problems Mother   . Heart Problems Father     DRUG ALLERGIES:   Allergies  Allergen Reactions  . Morphine   . Simvastatin   .  Stadol [Butorphanol] Itching    REVIEW OF SYSTEMS:   ROS As per history of present illness. All pertinent systems were reviewed above. Constitutional, HEENT, cardiovascular, respiratory, GI, GU, musculoskeletal, neuro, psychiatric, endocrine, integumentary and hematologic systems were reviewed and are otherwise negative/unremarkable except for positive findings mentioned above in the HPI.   MEDICATIONS AT HOME:   Prior to Admission medications   Medication Sig Start Date End Date Taking? Authorizing Provider  albuterol (VENTOLIN HFA) 108 (90 Base) MCG/ACT inhaler Inhale 2 puffs into the lungs every 6 (six) hours as needed for wheezing or shortness of breath. 04/18/20  Yes Flinchum, Kelby Aline, FNP  blood glucose meter kit and supplies Dispense based on patient and insurance preference. Use up to four times daily as directed. (FOR ICD-10 E10.9, E11.9). check blood glucose fasting and before meals four times daily. 04/18/20  Yes Flinchum, Kelby Aline, FNP  HYDROcodone-acetaminophen (NORCO) 5-325 MG tablet Take 1 tablet by mouth every 8 (eight) hours as needed for moderate pain. 10/21/20  Yes Bacigalupo, Dionne Bucy, MD  traZODone (DESYREL) 150 MG tablet Take 1-2 tablets (150-300 mg total) by mouth at bedtime as needed for sleep. 07/21/20  Yes Bacigalupo, Dionne Bucy, MD  allopurinol (ZYLOPRIM) 300 MG tablet Take 1 tablet (300 mg total) by mouth daily. 07/21/20   Virginia Crews, MD  amLODipine (NORVASC) 10 MG tablet Take 1 tablet (10 mg total) by mouth daily. 07/21/20   Virginia Crews, MD  aspirin EC 81 MG tablet Take 81 mg by mouth daily. Swallow whole.    [provider]  Cholecalciferol (VITAMIN D3 PO) Take 4,000 Units by mouth.    [provider]  clopidogrel (PLAVIX) 75 MG tablet Take 1 tablet (75 mg total) by mouth daily. 09/29/20   Virginia Crews, MD  Coenzyme Q10 (CO Q-10) 400 MG CAPS Take by mouth daily at 6 (six) AM.    [provider]  desoximetasone (TOPICORT) 0.25 % cream Apply 1 application topically 2 (two) times daily. 10/24/20   [provider]  escitalopram (LEXAPRO) 20 MG tablet Take 1 tablet (20 mg total) by mouth daily. 09/29/20   Bacigalupo, Dionne Bucy, MD  Evolocumab (REPATHA SURECLICK) 592 MG/ML SOAJ Inject 1 pen into the skin every 14 (fourteen) days. 05/05/20   Kate Sable, MD  fluticasone-salmeterol (ADVAIR HFA) 230-21 MCG/ACT inhaler Inhale 1-2 puffs into the lungs 2 (two) times daily. Rinse mouth after each use. 04/18/20   Flinchum, Kelby Aline, FNP  gabapentin (NEURONTIN) 300 MG capsule Take 2 capsules (600 mg total) by mouth at bedtime. 07/21/20   Bacigalupo, Dionne Bucy, MD  lisinopril (ZESTRIL) 5 MG tablet Take 1 tablet (5 mg  total) by mouth daily. 07/22/20   Virginia Crews, MD  lovastatin (MEVACOR) 40 MG tablet Take 1 tablet (40 mg total) by mouth daily. 07/21/20   Virginia Crews, MD  metoprolol tartrate (LOPRESSOR) 25 MG tablet Take 25 mg by mouth daily at 6 (six) AM. 04/08/20   [provider]  Omega-3 Fatty Acids (FISH OIL) 1200 MG CAPS Take by mouth daily at 6 (six) AM.    [provider]      VITAL SIGNS:  Blood pressure (!) 112/57, pulse (!) 56, temperature 98.6 F (37 C), temperature source Oral, resp. rate 14, weight 122.5 kg, SpO2 97 %.  PHYSICAL EXAMINATION:  Physical Exam  GENERAL:  75 y.o.-year-old Caucasian male patient lying in the bed with no acute distress.  EYES: Pupils equal, round, reactive to  light and accommodation. No scleral icterus. Extraocular muscles intact.  HEENT: Head atraumatic, normocephalic. Oropharynx and nasopharynx clear.  NECK:  Supple, no jugular venous distention. No thyroid enlargement, no tenderness.  LUNGS: Minimally diminished bibasilar breath sounds with bibasal rales.  CARDIOVASCULAR: Regular rate and rhythm, S1, S2 normal. No murmurs, rubs, or gallops.  ABDOMEN: Soft, nondistended, nontender. Bowel sounds present. No organomegaly or mass.  EXTREMITIES: Trace bilateral lower extremity pitting edema with no clubbing or cyanosis.   NEUROLOGIC: Cranial nerves II through XII are intact. Muscle strength 5/5 in all extremities. Sensation intact. Gait not checked.  PSYCHIATRIC: The patient is alert and oriented x 3.  Normal affect and good eye contact. SKIN: No obvious rash, lesion, or ulcer.   LABORATORY PANEL:   CBC Recent Labs  Lab 11/08/20 0418  WBC 9.1  HGB 15.7  HCT 48.2  PLT 202   ------------------------------------------------------------------------------------------------------------------  Chemistries  Recent Labs  Lab 11/08/20 0418  NA 139  K 4.7  CL 103  CO2 32  GLUCOSE 176*  BUN 27*  CREATININE 1.73*  CALCIUM  9.3  AST 16  ALT 11  ALKPHOS 50  BILITOT 0.7   ------------------------------------------------------------------------------------------------------------------  Cardiac Enzymes No results for input(s): TROPONINI in the last 168 hours. ------------------------------------------------------------------------------------------------------------------  RADIOLOGY:  DG Chest Port 1 View  Result Date: 11/08/2020 CLINICAL DATA:  Chest pain and hypoxemia EXAM: PORTABLE CHEST 1 VIEW COMPARISON:  None. FINDINGS: Previous median sternotomy for CABG procedure. Mild cardiac enlargement. Mild diffuse interstitial edema identified. No airspace opacities. Small right pleural effusion suspected. IMPRESSION: Mild congestive heart failure. Electronically Signed   By: Kerby Moors M.D.   On: 11/08/2020 05:23      IMPRESSION AND PLAN:  Active Problems:   NSTEMI (non-ST elevated myocardial infarction) ([place])  1.  Chest pain with elevated troponin concerning for non-ST elevation MI.  Given acute hypoxic respiratory failure we will will need to rule out PE.  The patient has a history of coronary disease status post four-vessel CABG status post PCI and 2 stents. - The patient will be admitted to a progressive unit bed. - We will continue him on IV heparin. - We will follow serial troponin I's. - Will be given high-dose statin therapy. - We will continue aspirin and Plavix and continue beta-blocker therapy. - We will continue beta-blocker therapy. - 2D echo and cardiology consult to be obtained. - I notified Dr. Aundra Dubin about the patient.  2.  Acute CHF, likely of new onset probably diastolic.  It could be due to non-STEMI.  And also can be associated with cor pulmonale. - The patient will be diuresed with IV Lasix. - 2D echo and a cardiology consult to be obtained as mentioned above. - Serial troponin I's will be followed  3.  Type II diabetes mellitus with stage IIIa chronic kidney disease. - The  patient will be placed on supplement coverage with NovoLog.  4.  Dyslipidemia. - The patient will be on high dose statin therapy.  5.  Depression. - We will continue Lexapro.  6.  Peripheral neuropathy. - We will continue Neurontin.  7.  Essential hypertension. - We will continue Lopressor, and amlodipine. - We will hold Zestril for now given elevated creatinine and IV contrast.  8.  COPD without exacerbation. - We will place him on as needed DuoNebs and hold off Advair Diskus.  9.  Gout. - We will continue allopurinol.  DVT prophylaxis: IV heparin drip. Code Status: full code. Family Communication:  The plan  of care was discussed in details with the patient (and family). I answered all questions. The patient agreed to proceed with the above mentioned plan. Further management will depend upon hospital course. Disposition Plan: Back to previous home environment Consults called: Cardiology. All the records are reviewed and case discussed with ED provider.  Status is: Inpatient  Remains inpatient appropriate because:Ongoing active pain requiring inpatient pain management, Ongoing diagnostic testing needed not appropriate for outpatient work up, Unsafe d/c plan, IV treatments appropriate due to intensity of illness or inability to take PO, and Inpatient level of care appropriate due to severity of illness  Dispo: The patient is from: Home              Anticipated d/c is to: Home              Patient currently is not medically stable to d/c.   Difficult to place patient No  TOTAL TIME TAKING CARE OF THIS PATIENT: 55 minutes.    Christel Mormon M.D on 11/08/2020 at 6:20 AM  [facility]   From 7 PM-7 AM, contact night-coverage [email or web address]  CC: Primary care physician; Virginia Crews, MD

## 2020-11-08 NOTE — ED Provider Notes (Signed)
[facility] Emergency Department Provider Note  ____________________________________________   Event Date/Time   First MD Initiated Contact with Patient 11/08/20 [phone]     (approximate)  I have reviewed the triage vital signs and the nursing notes.   HISTORY  Chief Complaint Chest Pain  Level 5 caveat:  history/ROS limited by acute/critical illness  HPI Tony Williams is a 75 y.o. male with extensive past medical history notable for CAD status post four-vessel CABG in 2007 and a myocardial infarction requiring placement of 2 stents in 2017.  He presents today with episodic chest pain over the last 1.5 weeks accompanied with shortness of breath.  However the pain started earlier this evening and has been constant and getting worse.  He has had a little bit of sweating, nausea, and shortness of breath associated with the sharp and heavy chest pain.  He said the chest pain radiates into his left shoulder and it feels numb but he has no weakness in his arms or his legs.  He denies pain in his abdomen and in his back.  Nothing in particular makes it better or worse.  He had some nitroglycerin at home but it was expired so he was afraid to take it.  He took 4 baby aspirin.  He reports that he has some COPD but usually it is not a problem for him.  He was noted to be 84% in triage and was brought immediately back to a room.     Past Medical History:  Diagnosis Date   Cancer ([place])    Chronic kidney disease    COPD (chronic obstructive pulmonary disease) ([place])    Depression    Diabetes mellitus without complication (HCC)    H/O heart artery stent    Heart problem     Patient Active Problem List   Diagnosis Date Noted   NSTEMI (non-ST elevated myocardial infarction) ([place]) 11/08/2020   Positive colorectal cancer screening using Cologuard test    Polyp of transverse colon    Morbid obesity ([place]) 07/21/2020   Chronic pain of both knees 07/21/2020   Diabetic  polyneuropathy associated with type 2 diabetes mellitus ([place]) 07/21/2020   Stage 3a chronic kidney disease ([place]) 07/21/2020   Chronic gout due to renal impairment without tophus 07/21/2020   Hypertension associated with diabetes ([place]) 07/21/2020   Hyperlipidemia associated with type 2 diabetes mellitus ([place]) 07/21/2020   GAD (generalized anxiety disorder) 07/21/2020   Psychophysiological insomnia 07/21/2020   Screening for blood or protein in urine 04/18/2020   Vitamin D insufficiency 04/18/2020   Chronic obstructive pulmonary disease ([place]) 04/18/2020   Controlled type 2 diabetes mellitus with diabetic polyneuropathy, without long-term current use of insulin ([place]) 04/18/2020   Coronary artery disease with hx of myocardial infarct w/o hx of CABG 04/18/2020   S/P primary angioplasty with coronary stent 04/18/2020   History of smoking at least 1 pack per day for at least 30 years 04/18/2020    Past Surgical History:  Procedure Laterality Date   ARTERIAL BYPASS SURGRY     COLONOSCOPY WITH PROPOFOL N/A 09/23/2020   Procedure: COLONOSCOPY WITH PROPOFOL;  Surgeon: Lucilla Lame, MD;  Location: [facility] ENDOSCOPY;  Service: Endoscopy;  Laterality: N/A;   EYE SURGERY     IR STENT PLACEMENT ANTE CAROTID INC ANGIO     KNEE SURGERY      Prior to Admission medications   Medication Sig Start Date End Date Taking? Authorizing Provider  albuterol (VENTOLIN HFA) 108 (90 Base) MCG/ACT  inhaler Inhale 2 puffs into the lungs every 6 (six) hours as needed for wheezing or shortness of breath. 04/18/20  Yes Flinchum, Kelby Aline, FNP  allopurinol (ZYLOPRIM) 300 MG tablet Take 1 tablet (300 mg total) by mouth daily. 07/21/20  Yes Bacigalupo, Dionne Bucy, MD  amLODipine (NORVASC) 10 MG tablet Take 1 tablet (10 mg total) by mouth daily. 07/21/20  Yes Bacigalupo, Dionne Bucy, MD  aspirin EC 81 MG tablet Take 81 mg by mouth daily. Swallow whole.   Yes [provider]  blood glucose meter kit and supplies Dispense based on  patient and insurance preference. Use up to four times daily as directed. (FOR ICD-10 E10.9, E11.9). check blood glucose fasting and before meals four times daily. 04/18/20  Yes Flinchum, Kelby Aline, FNP  Cholecalciferol (VITAMIN D3 PO) Take 4,000 Units by mouth.   Yes [provider]  clopidogrel (PLAVIX) 75 MG tablet Take 1 tablet (75 mg total) by mouth daily. 09/29/20  Yes Bacigalupo, Dionne Bucy, MD  Coenzyme Q10 (CO Q-10) 400 MG CAPS Take by mouth daily at 6 (six) AM.   Yes [provider]  desoximetasone (TOPICORT) 0.25 % cream Apply 1 application topically 2 (two) times daily. 10/24/20  Yes [provider]  escitalopram (LEXAPRO) 20 MG tablet Take 1 tablet (20 mg total) by mouth daily. 09/29/20  Yes Bacigalupo, Dionne Bucy, MD  Evolocumab (REPATHA SURECLICK) 628 MG/ML SOAJ Inject 1 pen into the skin every 14 (fourteen) days. 05/05/20  Yes Kate Sable, MD  fluticasone-salmeterol (ADVAIR HFA) 230-21 MCG/ACT inhaler Inhale 1-2 puffs into the lungs 2 (two) times daily. Rinse mouth after each use. 04/18/20  Yes Flinchum, Kelby Aline, FNP  gabapentin (NEURONTIN) 300 MG capsule Take 2 capsules (600 mg total) by mouth at bedtime. 07/21/20  Yes Bacigalupo, Dionne Bucy, MD  HYDROcodone-acetaminophen (NORCO) 5-325 MG tablet Take 1 tablet by mouth every 8 (eight) hours as needed for moderate pain. 10/21/20  Yes Bacigalupo, Dionne Bucy, MD  lisinopril (ZESTRIL) 5 MG tablet Take 1 tablet (5 mg total) by mouth daily. 07/22/20  Yes Bacigalupo, Dionne Bucy, MD  lovastatin (MEVACOR) 40 MG tablet Take 1 tablet (40 mg total) by mouth daily. 07/21/20  Yes Bacigalupo, Dionne Bucy, MD  metoprolol tartrate (LOPRESSOR) 25 MG tablet Take 25 mg by mouth daily at 6 (six) AM. 04/08/20  Yes [provider]  Omega-3 Fatty Acids (FISH OIL) 1200 MG CAPS Take by mouth daily at 6 (six) AM.   Yes [provider]  traZODone (DESYREL) 150 MG tablet Take 1-2 tablets (150-300 mg total) by mouth at bedtime as needed  for sleep. 07/21/20  Yes Bacigalupo, Dionne Bucy, MD    Allergies Morphine, Simvastatin, and Stadol [butorphanol]  Family History  Problem Relation Age of Onset   Heart Problems Mother    Heart Problems Father     Social History Social History   Tobacco Use   Smoking status: Former    Types: Cigarettes    Quit date: 10/23/2003    Years since quitting: 17.0   Smokeless tobacco: Never  Vaping Use   Vaping Use: Never used  Substance Use Topics   Alcohol use: Never   Drug use: Never    Review of Systems Level 5 caveat:  history/ROS limited by acute/critical illness   constitutional: No fever/chills Eyes: No visual changes. ENT: No sore throat. Cardiovascular: Positive for sharp and heavy chest pain radiating to his left shoulder Respiratory: Positive for shortness of breath. Gastrointestinal: No abdominal pain.  No nausea, no vomiting.  No diarrhea.  No constipation. Genitourinary: Negative for dysuria. Musculoskeletal: Negative for neck pain.  Negative for back pain. Integumentary: Negative for rash. Neurological: Some numbness and pain in his left shoulder, no focal weakness.   ____________________________________________   PHYSICAL EXAM:  VITAL SIGNS: ED Triage Vitals  Enc Vitals Group     BP 11/08/20 0411 (!) 138/58     Pulse Rate 11/08/20 0411 68     Resp 11/08/20 0411 20     Temp 11/08/20 0411 98.6 F (37 C)     Temp Source 11/08/20 0411 Oral     SpO2 11/08/20 0411 (!) 84 %     Weight 11/08/20 0424 122.5 kg (270 lb)     Height --      Head Circumference --      Peak Flow --      Pain Score 11/08/20 0413 6     Pain Loc --      Pain Edu? --      Excl. in [place]? --     Constitutional: Alert and oriented.  Eyes: Conjunctivae are normal.  Head: Atraumatic. Nose: No congestion/rhinnorhea. Mouth/Throat: Patient is wearing a mask. Neck: No stridor.  No meningeal signs.   Cardiovascular: Normal rate, regular rhythm. Good peripheral circulation. Respiratory:  Normal respiratory effort.  No retractions.  Lungs are clear to auscultation bilaterally. Gastrointestinal: Soft and nontender. No distention.  Musculoskeletal: No lower extremity tenderness nor edema. No gross deformities of extremities. Neurologic:  Normal speech and language. No gross focal neurologic deficits are appreciated.  Skin:  Skin is warm, dry and intact. Psychiatric: Mood and affect are normal. Speech and behavior are normal.  ____________________________________________   LABS (all labs ordered are listed, but only abnormal results are displayed)  Labs Reviewed  COMPREHENSIVE METABOLIC PANEL - Abnormal; Notable for the following components:      Result Value   Glucose, Bld 176 (*)    BUN 27 (*)    Creatinine, Ser 1.73 (*)    GFR, Estimated 41 (*)    Anion gap 4 (*)    All other components within normal limits  BRAIN NATRIURETIC PEPTIDE - Abnormal; Notable for the following components:   B Natriuretic Peptide 138.7 (*)    All other components within normal limits  TROPONIN I (HIGH SENSITIVITY) - Abnormal; Notable for the following components:   Troponin I (High Sensitivity) 376 (*)    All other components within normal limits  TROPONIN I (HIGH SENSITIVITY) - Abnormal; Notable for the following components:   Troponin I (High Sensitivity) 455 (*)    All other components within normal limits  RESP PANEL BY RT-PCR (FLU A&B, COVID) ARPGX2  CBC  PROTIME-INR  APTT  HEPARIN LEVEL (UNFRACTIONATED)   ____________________________________________  EKG  ED ECG REPORT I, Hinda Kehr, the attending physician, personally viewed and interpreted this ECG.  Date: 11/08/2020 EKG Time: 4:10 AM Rate: 72 Rhythm: normal sinus rhythm with occasional PVC QRS Axis: normal Intervals: normal ST/T Wave abnormalities: Non-specific ST segment / T-wave changes, but no clear evidence of acute ischemia. Narrative Interpretation: no definitive evidence of acute ischemia; does not meet  STEMI criteria.  ____________________________________________  RADIOLOGY I, Hinda Kehr, personally viewed and evaluated these images (plain radiographs) as part of my medical decision making, as well as reviewing the written report by the radiologist.  ED MD interpretation: Mild congestive heart failure with some diffuse edema  Official radiology report(s): CT Angio Chest PE W and/or Wo Contrast  Result Date: 11/08/2020 CLINICAL DATA:  75 year old male with central chest pain radiating to the back and left shoulder blade. EXAM: CT ANGIOGRAPHY CHEST WITH CONTRAST TECHNIQUE: Multidetector CT imaging of the chest was performed using the standard protocol during bolus administration of intravenous contrast. Multiplanar CT image reconstructions and MIPs were obtained to evaluate the vascular anatomy. CONTRAST:  164m OMNIPAQUE IOHEXOL 350 MG/ML SOLN COMPARISON:  Portable chest 0442 hours today. FINDINGS: Cardiovascular: Good contrast bolus timing in the pulmonary arterial tree. No focal filling defect identified in the pulmonary arteries to suggest acute pulmonary embolism. Prior sternotomy. Calcified aortic atherosclerosis. Cardiomegaly. No pericardial effusion. Mediastinum/Nodes: Postoperative changes with no mediastinal mass or lymphadenopathy. Maximal and mildly asymmetric right hilar lymph nodes. Lungs/Pleura: Volume loss in the right hemithorax with posterior and costophrenic angle pleural thickening and/or intermediate density small pleural collection (series 4, image 84) with faint calcification. Associated right lower lobe basal segment atelectasis and mild architectural distortion. Major airways remain patent with atelectatic changes. Scattered subpleural scarring in both lungs. No left pleural effusion. Upper Abdomen: Absent gallbladder. Negative visible noncontrast liver, spleen, pancreas, adrenal glands and bowel in the upper abdomen. Partially visible atrophied left kidney. Musculoskeletal:  Chronic but ununited sternotomy. T12 wedge compression fracture with largely obliterated T11-T12 disc space, but the T11 inferior endplate remains intact. Interbody ankylosis there and at other thoracic levels in part related to flowing endplate osteophytes. No acute or suspicious osseous lesion identified. Review of the MIP images confirms the above findings. IMPRESSION: 1. Negative for acute pulmonary embolus. 2. Right lung volume loss with lower lobe fibrothorax, or less likely small pleural empyema. Associated right lung atelectasis and architectural distortion. Mild atelectasis and pulmonary scarring elsewhere. 3. Cardiomegaly.  Aortic Atherosclerosis (ICD10-I70.0). 4. Chronic T12 compression fracture. Electronically Signed   By: HGenevie AnnM.D.   On: 11/08/2020 07:30   DG Chest Port 1 View  Result Date: 11/08/2020 CLINICAL DATA:  Chest pain and hypoxemia EXAM: PORTABLE CHEST 1 VIEW COMPARISON:  None. FINDINGS: Previous median sternotomy for CABG procedure. Mild cardiac enlargement. Mild diffuse interstitial edema identified. No airspace opacities. Small right pleural effusion suspected. IMPRESSION: Mild congestive heart failure. Electronically Signed   By: TKerby MoorsM.D.   On: 11/08/2020 05:23    ____________________________________________   PROCEDURES   Procedure(s) performed (including Critical Care):  .1-3 Lead EKG Interpretation  Date/Time: 11/08/2020 7:30 AM Performed by: FHinda Kehr MD Authorized by: FHinda Kehr MD     Interpretation: normal     ECG rate:  75   ECG rate assessment: normal     Rhythm: sinus rhythm     Ectopy: PVCs     Conduction: normal   .Critical Care  Date/Time: 11/08/2020 7:30 AM Performed by: FHinda Kehr MD Authorized by: FHinda Kehr MD   Critical care provider statement:    Critical care time (minutes):  45   Critical care time was exclusive of:  Separately billable procedures and treating other patients   Critical care was necessary  to treat or prevent imminent or life-threatening deterioration of the following conditions:  Respiratory failure and cardiac failure   Critical care was time spent personally by me on the following activities:  Development of treatment plan with patient or surrogate, discussions with consultants, evaluation of patient's response to treatment, examination of patient, obtaining history from patient or surrogate, ordering and performing treatments and interventions, ordering and review of laboratory studies, ordering and review of radiographic studies, pulse oximetry, re-evaluation of patient's condition and review of  old charts   ____________________________________________   INITIAL IMPRESSION / MDM / ASSESSMENT AND PLAN / ED COURSE  As part of my medical decision making, I reviewed the following data within the [place] notes reviewed and incorporated, Labs reviewed , EKG interpreted , Old chart reviewed, Radiograph reviewed , Discussed with admitting physician , and Notes from prior ED visits   Differential diagnosis includes, but is not limited to, ACS, PE, AAS, pneumonia, musculoskeletal pain.  The patient is on the cardiac monitor to evaluate for evidence of arrhythmia and/or significant heart rate changes.  EKG does not show evidence of acute ischemia.  Vital signs are notable for stable blood pressure and heart rate but a room air oxygen saturation of 84%.  SPO2 improved to about 98% on 4 L of oxygen by nasal cannula.  Given the patient's history and symptoms, I ordered 3 doses of sublingual nitroglycerin as needed and after 2 doses his pain completely resolved and he described it as a 0.  He also had improvement of his respiratory symptoms although that could have also been because of the oxygen by nasal cannula.  I personally reviewed the patient's imaging and agree with the radiologist's interpretation that there is some mild "fluffiness" consistent with CHF, but  it is difficult to determine whether or not this is the proximal cause of his symptoms or whether he has having acute coronary event that has resulted in some acute heart failure.  PE is also possible although I think less likely.  He has some baseline kidney disease and his creatinine again today is about 1.7 with a GFR 40.  I will discuss with CT but this may preclude him from getting a CTA chest.  Strong suspicion for ACS but patient's pain has resolved after nitroglycerin and no evidence of STEMI on EKG, anticipate admission, awaiting troponin result.  CBC within normal limits.     Clinical Course as of 11/08/20 0734  Sat Nov 08, 2020  0518 Troponin I (High Sensitivity)(!!) Troponin is 376, and under the circumstances I suspect this is consistent with NSTEMI.  I am ordering heparin bolus plus infusion.  The patient's pain completely resolved after 2 doses of nitroglycerin and I strongly doubt aortic dissection at this point. [CF]  [phone] (delayed documentation) the patient is comfortable with the plan for admission.  I discussed the case by phone with Dr. Sidney Ace.  We talked about the CTA chest and he also thinks it would be a good idea, so I spoke with Randall Hiss with CT and he confirmed that the cut off is a creatinine of 2 and/or a GFR of 30.  I ordered the scan and asked that it be performed for the patient to go upstairs to the floor.  He received his heparin bolus plus infusion and has remained asymptomatic from a chest pain perspective. [CF]    Clinical Course User Index [CF] Hinda Kehr, MD     ____________________________________________  FINAL CLINICAL IMPRESSION(S) / ED DIAGNOSES  Final diagnoses:  NSTEMI (non-ST elevated myocardial infarction) ([place])  Acute respiratory failure with hypoxemia ([place])     MEDICATIONS GIVEN DURING THIS VISIT:  Medications  nitroGLYCERIN (NITROSTAT) SL tablet 0.4 mg (0.4 mg Sublingual Given 11/08/20 0712)  heparin bolus via infusion 4,000 Units (4,000  Units Intravenous Bolus from Bag 11/08/20 0555)    Followed by  heparin ADULT infusion 100 units/mL (25000 units/250m) (1,700 Units/hr Intravenous New Bag/Given 11/08/20 0555)  allopurinol (ZYLOPRIM) tablet 300 mg (has no  administration in time range)  aspirin EC tablet 81 mg (has no administration in time range)  HYDROcodone-acetaminophen (NORCO/VICODIN) 5-325 MG per tablet 1 tablet (has no administration in time range)  amLODipine (NORVASC) tablet 10 mg (has no administration in time range)  escitalopram (LEXAPRO) tablet 20 mg (has no administration in time range)  traZODone (DESYREL) tablet 150-300 mg (has no administration in time range)  clopidogrel (PLAVIX) tablet 75 mg (has no administration in time range)  gabapentin (NEURONTIN) capsule 600 mg (has no administration in time range)  cholecalciferol (VITAMIN D) tablet 4,000 Units (has no administration in time range)  omega-3 acid ethyl esters (LOVAZA) capsule 1 g (has no administration in time range)  albuterol (PROVENTIL) (2.5 MG/3ML) 0.083% nebulizer solution 3 mL (has no administration in time range)  fluocinonide cream (LIDEX) 0.05 % (has no administration in time range)  atorvastatin (LIPITOR) tablet 80 mg (has no administration in time range)  acetaminophen (TYLENOL) tablet 650 mg (has no administration in time range)    Or  acetaminophen (TYLENOL) suppository 650 mg (has no administration in time range)  magnesium hydroxide (MILK OF MAGNESIA) suspension 30 mL (has no administration in time range)  ondansetron (ZOFRAN) tablet 4 mg (has no administration in time range)    Or  ondansetron (ZOFRAN) injection 4 mg (has no administration in time range)  furosemide (LASIX) injection 40 mg (has no administration in time range)  nitroGLYCERIN (NITROSTAT) SL tablet 0.4 mg (has no administration in time range)  fentaNYL (SUBLIMAZE) injection 25 mcg (has no administration in time range)  metoprolol tartrate (LOPRESSOR) tablet 25 mg (has no  administration in time range)  iohexol (OMNIPAQUE) 350 MG/ML injection 100 mL (100 mLs Intravenous Contrast Given 11/08/20 0654)     ED Discharge Orders     None        Note:  This document was prepared using Dragon voice recognition software and may include unintentional dictation errors.   Hinda Kehr, MD 11/08/20 [phone]

## 2020-11-08 NOTE — Progress Notes (Signed)
ANTICOAGULATION CONSULT NOTE   Pharmacy Consult for heparin infusion Indication: ACS/STEMI  Allergies  Allergen Reactions   Stadol [Butorphanol] Itching    Patient Measurements: Weight: 122.5 kg (270 lb) Heparin Dosing Weight: 122.5 kg  Vital Signs: Temp: 98.6 F (37 C) (08/27 0411) Temp Source: Oral (08/27 0411) BP: 112/67 (08/27 0500) Pulse Rate: 61 (08/27 0500)  Labs: Recent Labs    11/08/20 0418  HGB 15.7  HCT 48.2  PLT 202  CREATININE 1.73*  TROPONINIHS 376*    Estimated Creatinine Clearance: 48.4 mL/min (A) (by C-G formula based on SCr of 1.73 mg/dL (H)).   Medical History: Past Medical History:  Diagnosis Date   Cancer ([place])    Chronic kidney disease    COPD (chronic obstructive pulmonary disease) ([place])    Depression    Diabetes mellitus without complication ([place])    H/O heart artery stent    Heart problem     Assessment: Pt is 75 yo male w/ hx of quad bypass & MI, presenting to ED with CP radiating to L side/arm & back and initial Troponin I = 376.  Goal of Therapy:  Heparin level 0.3-0.7 units/ml Monitor platelets by anticoagulation protocol: Yes   Plan:  Bolus 4000 units x 1 Start heparin infusion at 1700 units/hr Check HL 8 hr after infusion started CBC daily while on heparin.  Renda Rolls, PharmD, [facility] 11/08/2020 5:39 AM

## 2020-11-09 DIAGNOSIS — I214 Non-ST elevation (NSTEMI) myocardial infarction: Secondary | ICD-10-CM | POA: Diagnosis not present

## 2020-11-09 DIAGNOSIS — J449 Chronic obstructive pulmonary disease, unspecified: Secondary | ICD-10-CM | POA: Diagnosis not present

## 2020-11-09 DIAGNOSIS — E1142 Type 2 diabetes mellitus with diabetic polyneuropathy: Secondary | ICD-10-CM | POA: Diagnosis not present

## 2020-11-09 LAB — BASIC METABOLIC PANEL
Anion gap: 10 (ref 5–15)
BUN: 22 mg/dL (ref 8–23)
CO2: 29 mmol/L (ref 22–32)
Calcium: 9 mg/dL (ref 8.9–10.3)
Chloride: 100 mmol/L (ref 98–111)
Creatinine, Ser: 1.63 mg/dL — ABNORMAL HIGH (ref 0.61–1.24)
GFR, Estimated: 44 mL/min — ABNORMAL LOW (ref >60)
Glucose, Bld: 109 mg/dL — ABNORMAL HIGH (ref 70–99)
Potassium: 4.3 mmol/L (ref 3.5–5.1)
Sodium: 139 mmol/L (ref 135–145)

## 2020-11-09 LAB — LIPID PANEL
Cholesterol: 123 mg/dL (ref 0–200)
HDL: 32 mg/dL — ABNORMAL LOW (ref >40)
LDL Cholesterol: 47 mg/dL (ref 0–99)
Total CHOL/HDL Ratio: 3.8 RATIO
Triglycerides: 219 mg/dL — ABNORMAL HIGH (ref <150)
VLDL: 44 mg/dL — ABNORMAL HIGH (ref 0–40)

## 2020-11-09 LAB — ECHOCARDIOGRAM COMPLETE
AR max vel: 3.37 cm2
AV Peak grad: 4.8 mmHg
Ao pk vel: 1.09 m/s
Height: 69 in
S' Lateral: 5.2 cm
Weight: 4337.6 oz

## 2020-11-09 LAB — CBC
HCT: 45.4 % (ref 39.0–52.0)
Hemoglobin: 14.8 g/dL (ref 13.0–17.0)
MCH: 30.8 pg (ref 26.0–34.0)
MCHC: 32.6 g/dL (ref 30.0–36.0)
MCV: 94.4 fL (ref 80.0–100.0)
Platelets: 187 10*3/uL (ref 150–400)
RBC: 4.81 MIL/uL (ref 4.22–5.81)
RDW: 15 % (ref 11.5–15.5)
WBC: 9.3 10*3/uL (ref 4.0–10.5)
nRBC: 0 % (ref 0.0–0.2)

## 2020-11-09 LAB — HEPARIN LEVEL (UNFRACTIONATED)
Heparin Unfractionated: 0.45 IU/mL (ref 0.30–0.70)
Heparin Unfractionated: 0.47 IU/mL (ref 0.30–0.70)

## 2020-11-09 LAB — MAGNESIUM: Magnesium: 1.8 mg/dL (ref 1.7–2.4)

## 2020-11-09 MED ORDER — ISOSORBIDE MONONITRATE ER 30 MG PO TB24
15.0000 mg | ORAL_TABLET | Freq: Every day | ORAL | Status: DC
  Administered 2020-11-09 – 2020-11-10 (×2): 15 mg via ORAL
  Filled 2020-11-09 (×2): qty 1

## 2020-11-09 MED ORDER — FENTANYL CITRATE (PF) 100 MCG/2ML IJ SOLN
25.0000 ug | INTRAMUSCULAR | Status: DC | PRN
  Administered 2020-11-09 – 2020-11-11 (×7): 25 ug via INTRAVENOUS
  Filled 2020-11-09 (×7): qty 2

## 2020-11-09 NOTE — Progress Notes (Signed)
   11/09/20 0910  Clinical Encounter Type  Visited With Patient and family together  Visit Type Initial;Spiritual support;Social support  Referral From Nurse  Consult/Referral To Chaplain  Spiritual Encounters  Spiritual Needs Prayer;Other (Comment) (File AD/living will)  Advance Directives (For Healthcare)  Copy of Living Will in Chart? Yes - validated most recent copy scanned in chart (See row information)  Chaplain Burris responded to a page from [place], [place], to assist family with a living will. Family presented an already complete and notarized document; chaplain made copies and adding this to Pt's chart. Chaplain Burris engaged Pt and his spouse to assess any other needs. Chaplain provided support and words of encouragement as Pt prepares for heart cath. Chaplain also offered prayer at Pt's request.

## 2020-11-09 NOTE — Progress Notes (Signed)
ANTICOAGULATION CONSULT NOTE   Pharmacy Consult for heparin infusion Indication: ACS/STEMI  Allergies  Allergen Reactions   Morphine    Simvastatin    Stadol [Butorphanol] Itching    Patient Measurements: Height: '5\' 9"'$  (175.3 cm) Weight: 123 kg (271 lb 1.6 oz) IBW/kg (Calculated) : 70.7 Heparin Dosing Weight: 122.5 kg  Vital Signs: Temp: 98.3 F (36.8 C) (08/27 2330) Temp Source: Oral (08/27 2330) BP: 133/57 (08/27 2330) Pulse Rate: 61 (08/27 1605)  Labs: Recent Labs    11/08/20 0418 11/08/20 0558 11/08/20 0616 11/08/20 1359 11/08/20 2347  HGB 15.7  --   --   --   --   HCT 48.2  --   --   --   --   PLT 202  --   --   --   --   APTT  --  30  --   --   --   LABPROT  --  13.9  --   --   --   INR  --  1.1  --   --   --   HEPARINUNFRC  --   --   --  0.28* 0.47  CREATININE 1.73*  --   --   --   --   TROPONINIHS 376*  --  455*  --   --      Estimated Creatinine Clearance: 48.5 mL/min (A) (by C-G formula based on SCr of 1.73 mg/dL (H)).   Medical History: Past Medical History:  Diagnosis Date   Cancer ([place])    Chronic kidney disease    COPD (chronic obstructive pulmonary disease) ([place])    Depression    Diabetes mellitus without complication ([place])    H/O heart artery stent    Heart problem     Assessment: Pt is 75 yo male w/ hx of quad bypass & MI, presenting to ED with CP radiating to L side/arm & back and initial Troponin I = 376.   8/27'@1359'$ : HL 0.28 subtherapeutic 8/27@ 2347 HL 0.47, therapeutic  Goal of Therapy:  Heparin level 0.3-0.7 units/ml Monitor platelets by anticoagulation protocol: Yes   Plan:  Continue heparin infusion at 1900 units/hr Recheck HL with AM labs to confirm CBC daily while on heparin.  Renda Rolls, PharmD, [facility] 11/09/2020 1:36 AM

## 2020-11-09 NOTE — Progress Notes (Addendum)
Progress Note  Patient Name: Tony Williams Date of Encounter: 11/09/2020  [facility] Cardiologist: New  Subjective   UOP -2.4L overnight, however patient feels breathing is the same. Still on 2-3L O2. He had chest pain episode overnight that lasted an hour. Kidney function good.   Inpatient Medications    Scheduled Meds:  allopurinol  300 mg Oral Daily   amLODipine  10 mg Oral Daily   aspirin EC  81 mg Oral Daily   atorvastatin  80 mg Oral Daily   cholecalciferol  4,000 Units Oral Daily   clopidogrel  75 mg Oral Daily   escitalopram  20 mg Oral Daily   fluocinonide cream   Topical BID   furosemide  40 mg Intravenous Q12H   gabapentin  600 mg Oral QHS   metoprolol tartrate  25 mg Oral Daily   omega-3 acid ethyl esters  1 g Oral BID   Continuous Infusions:  heparin 1,900 Units/hr (11/09/20 0609)   PRN Meds: acetaminophen **OR** acetaminophen, albuterol, fentaNYL (SUBLIMAZE) injection, HYDROcodone-acetaminophen, magnesium hydroxide, nitroGLYCERIN, ondansetron **OR** ondansetron (ZOFRAN) IV, traZODone   Vital Signs    Vitals:   11/08/20 1605 11/08/20 2000 11/08/20 2330 11/09/20 0503  BP: (!) 141/59 (!) 117/52 (!) 133/57 121/61  Pulse: 61     Resp:  '18 20 18  '$ Temp:  98.7 F (37.1 C) 98.3 F (36.8 C) 97.7 F (36.5 C)  TempSrc:  Oral Oral Oral  SpO2: 95% 92% 92% 92%  Weight:    120.3 kg  Height:        Intake/Output Summary (Last 24 hours) at 11/09/2020 0803 Last data filed at 11/09/2020 0700 Gross per 24 hour  Intake 857.67 ml  Output 2450 ml  Net -1592.33 ml   Last 3 Weights 11/09/2020 11/08/2020 11/08/2020  Weight (lbs) 265 lb 3.2 oz 271 lb 1.6 oz 270 lb  Weight (kg) 120.294 kg 122.97 kg 122.471 kg      Telemetry    Nsr, HR 60s - Personally Reviewed  ECG    No new - Personally Reviewed  Physical Exam   GEN: No acute distress.   Neck: No JVD Cardiac: RRR, no murmurs, rubs, or gallops.  Respiratory: diffusely dimimished GI: Soft, nontender,  non-distended  MS: No edema; No deformity. Neuro:  Nonfocal  Psych: Normal affect   Labs    High Sensitivity Troponin:   Recent Labs  Lab 11/08/20 0418 11/08/20 0616  TROPONINIHS 376* 455*      Chemistry Recent Labs  Lab 11/08/20 0418 11/09/20 0436  NA 139 139  K 4.7 4.3  CL 103 100  CO2 32 29  GLUCOSE 176* 109*  Tony 27* 22  CREATININE 1.73* 1.63*  CALCIUM 9.3 9.0  PROT 7.2  --   ALBUMIN 3.7  --   AST 16  --   ALT 11  --   ALKPHOS 50  --   BILITOT 0.7  --   GFRNONAA 41* 44*  ANIONGAP 4* 10     Hematology Recent Labs  Lab 11/08/20 0418 11/09/20 0436  WBC 9.1 9.3  RBC 5.08 4.81  HGB 15.7 14.8  HCT 48.2 45.4  MCV 94.9 94.4  MCH 30.9 30.8  MCHC 32.6 32.6  RDW 15.2 15.0  PLT 202 187    BNP Recent Labs  Lab 11/08/20 0558  BNP 138.7*     DDimer No results for input(s): DDIMER in the last 168 hours.   Radiology    CT Angio Chest PE W and/or  Wo Contrast  Result Date: 11/08/2020 CLINICAL DATA:  75 year old male with central chest pain radiating to the back and left shoulder blade. EXAM: CT ANGIOGRAPHY CHEST WITH CONTRAST TECHNIQUE: Multidetector CT imaging of the chest was performed using the standard protocol during bolus administration of intravenous contrast. Multiplanar CT image reconstructions and MIPs were obtained to evaluate the vascular anatomy. CONTRAST:  136m OMNIPAQUE IOHEXOL 350 MG/ML SOLN COMPARISON:  Portable chest 0442 hours today. FINDINGS: Cardiovascular: Good contrast bolus timing in the pulmonary arterial tree. No focal filling defect identified in the pulmonary arteries to suggest acute pulmonary embolism. Prior sternotomy. Calcified aortic atherosclerosis. Cardiomegaly. No pericardial effusion. Mediastinum/Nodes: Postoperative changes with no mediastinal mass or lymphadenopathy. Maximal and mildly asymmetric right hilar lymph nodes. Lungs/Pleura: Volume loss in the right hemithorax with posterior and costophrenic angle pleural thickening  and/or intermediate density small pleural collection (series 4, image 84) with faint calcification. Associated right lower lobe basal segment atelectasis and mild architectural distortion. Major airways remain patent with atelectatic changes. Scattered subpleural scarring in both lungs. No left pleural effusion. Upper Abdomen: Absent gallbladder. Negative visible noncontrast liver, spleen, pancreas, adrenal glands and bowel in the upper abdomen. Partially visible atrophied left kidney. Musculoskeletal: Chronic but ununited sternotomy. T12 wedge compression fracture with largely obliterated T11-T12 disc space, but the T11 inferior endplate remains intact. Interbody ankylosis there and at other thoracic levels in part related to flowing endplate osteophytes. No acute or suspicious osseous lesion identified. Review of the MIP images confirms the above findings. IMPRESSION: 1. Negative for acute pulmonary embolus. 2. Right lung volume loss with lower lobe fibrothorax, or less likely small pleural empyema. Associated right lung atelectasis and architectural distortion. Mild atelectasis and pulmonary scarring elsewhere. 3. Cardiomegaly.  Aortic Atherosclerosis (ICD10-I70.0). 4. Chronic T12 compression fracture. Electronically Signed   By: HGenevie AnnM.D.   On: 11/08/2020 07:30   DG Chest Port 1 View  Result Date: 11/08/2020 CLINICAL DATA:  Chest pain and hypoxemia EXAM: PORTABLE CHEST 1 VIEW COMPARISON:  None. FINDINGS: Previous median sternotomy for CABG procedure. Mild cardiac enlargement. Mild diffuse interstitial edema identified. No airspace opacities. Small right pleural effusion suspected. IMPRESSION: Mild congestive heart failure. Electronically Signed   By: TKerby MoorsM.D.   On: 11/08/2020 05:23    Cardiac Studies   Echo 05/2020  1. Left ventricular ejection fraction, by estimation, is 60 to 65%. The  left ventricle has normal function. The left ventricle has no regional  wall motion abnormalities.  Left ventricular diastolic parameters are  indeterminate.   2. Right ventricular systolic function is low normal. The right  ventricular size is not well visualized.   3. The mitral valve is normal in structure. No evidence of mitral valve  regurgitation.   4. The aortic valve was not well visualized. Aortic valve regurgitation  is trivial.    Myoview Lexiscan 05/2020 Narrative & Impression  T wave inversion was noted during stress in the aVL leads. There was no ST segment deviation noted during stress. Defect 1: There is a large defect of severe severity present in the basal inferior, basal inferolateral, mid inferior and mid inferolateral location. Findings consistent with prior myocardial infarction with moderate peri-infarct ischemia. This is an intermediate risk study. Nuclear stress EF: 45%. CT attenuation images show evidence of moderate aortic and coronary calcifications.    This result has not been signed. Information might be incomplete.    Patient Profile     75y.o. male  with a hx of CAD  s/p CABG x4 in 2007, PCI x2 in 2018, former smoker, possible COPD, OSA, obesity (trying to lose weight), diabetes, CKD Stage 3,  who is being seen 11/08/2020 for the evaluation of NSTEMI.  Assessment & Plan   NSTEMI CAD s/p CABGx4 in 2007 with MI in 2018 with stenting x2 - presented with chest pain similar to prior MI with associated SOB. Pain improve with SL NTG. Found to have HS trop 376>455, mildly elevated BNP,  Kidney function around baseline 1.7. Started on IV heparin. EKG with no new ischemic changes - echo 05/2020 with normal LVSF, no WMA, indeterminate diastolic dysfunction. Myoview lexiscan with prior infarct with moderate peri-infarct ischemia, intermediate risk study - echo ordered this admission - continue PTA Aspirin, plavix, Lopressor '25mg'$  daily, statin, repatha - continue IV heparin for 48 hours.  - given CKD not the best candidate for cardiac cath, however Namira Rosekrans make NPO  at midnight for cath pending kidney function. May need Right heart cath if breathing is unchanged.  Risks and benefits of cardiac catheterization have been discussed with the patient.  These include bleeding, infection, kidney damage, stroke, heart attack, death.  The patient understands these risks and is willing to proceed.    Mild CHF - BNP 138 and CXR with mild CHF - IV lasix '40mg'$  BID - echo 05/2020 with normal LVEF and indeterminate diastolic function - echo repeated as above - UOP -2.4L, weight 271lbs>265lbs - kidney function stable - patient feels breathing is unchanged. Still on 2-3L O2, may been RHC as above.    HLD - h/o intolerance to statins, but started on atorvastatin '80mg'$  on admission>>can trial this, but would recommend PTA lovastatin given history - LDL 47 10/2020 - continue repatha   HTN - amlodipine '10mg'$  daily and Lopressor '25mg'$  daily - PTA lisinopril held - Bps wnl   CKD stage 3 - Scr 1.73 on admission, which appears to be around baseline - lisinopril held - Scr 1.63, Tony 22 today - monitor Scr/Tony with lasix   COPD exacerbation - duonebs per IM   DM2 - a1C 5.9 10/2020 - SSI per IM   OSA - not compliant with CPAP  For questions or updates, please contact [facility] Please consult [email or web address] for contact info under        Signed, Cadence Ninfa Meeker, PA-C  11/09/2020, 8:03 AM     I have seen and examined this patient with Cadence Furth.  Agree with above, note added to reflect my findings.  On exam, RRR, no murmurs.  Patient feeling well today, but did have chest pain overnight last night.  We Forestine Macho make sure that he has nitroglycerin as this has improved his pain in the past.  He continues to be short of breath.  I do think that he would likely benefit from left and right heart catheterization.  We Glendora Clouatre continue to monitor his kidney function, and if it improves or remained stable through tomorrow, likely catheterization at that point.  Duel Conrad M.  Tyton Abdallah MD 11/09/2020 12:01 PM

## 2020-11-09 NOTE — Progress Notes (Signed)
ANTICOAGULATION CONSULT NOTE   Pharmacy Consult for heparin infusion Indication: ACS/STEMI  Allergies  Allergen Reactions   Morphine    Simvastatin    Stadol [Butorphanol] Itching    Patient Measurements: Height: '5\' 9"'$  (175.3 cm) Weight: 120.3 kg (265 lb 3.2 oz) IBW/kg (Calculated) : 70.7 Heparin Dosing Weight: 122.5 kg  Vital Signs: Temp: 97.7 F (36.5 C) (08/28 0503) Temp Source: Oral (08/28 0503) BP: 121/61 (08/28 0503)  Labs: Recent Labs    11/08/20 0418 11/08/20 0558 11/08/20 0616 11/08/20 1359 11/08/20 2347 11/09/20 0436  HGB 15.7  --   --   --   --  14.8  HCT 48.2  --   --   --   --  45.4  PLT 202  --   --   --   --  187  APTT  --  30  --   --   --   --   LABPROT  --  13.9  --   --   --   --   INR  --  1.1  --   --   --   --   HEPARINUNFRC  --   --   --  0.28* 0.47 0.45  CREATININE 1.73*  --   --   --   --  1.63*  TROPONINIHS 376*  --  455*  --   --   --      Estimated Creatinine Clearance: 50.9 mL/min (A) (by C-G formula based on SCr of 1.63 mg/dL (H)).   Medical History: Past Medical History:  Diagnosis Date   Cancer ([place])    Chronic kidney disease    COPD (chronic obstructive pulmonary disease) ([place])    Depression    Diabetes mellitus without complication ([place])    H/O heart artery stent    Heart problem     Assessment: Pt is 75 yo male w/ hx of quad bypass & MI, presenting to ED with CP radiating to L side/arm & back and initial Troponin I = 376.   8/27'@1359'$ : HL 0.28 subtherapeutic 8/27@ 2347 HL 0.47, therapeutic 8/28@ 0436 HL 0.45, therapeutic x 2  Goal of Therapy:  Heparin level 0.3-0.7 units/ml Monitor platelets by anticoagulation protocol: Yes   Plan:  Continue heparin infusion at 1900 units/hr Recheck HL with AM labs daily CBC daily while on heparin.  Renda Rolls, PharmD, [facility] 11/09/2020 6:40 AM

## 2020-11-09 NOTE — Progress Notes (Signed)
PROGRESS NOTE    Tony Williams  [id number] DOB: 06/12/1945 DOA: 11/08/2020 PCP: Virginia Crews, MD   Chief complaint.  Chest pain. Brief Narrative:  Tony Williams is a 75 y.o. Caucasian male with medical history significant for coronary artery disease status post four-vessel CABG, status post PCI and 2 stents, COPD, depression, type 2 diabetes mellitus and stage IIIa chronic kidney disease, who presented to emergency room with acute onset of midsternal chest pain.  EKG had no ST elevation, peak troponin 455.  He has been seen by cardiology, currently on heparin drip.   Assessment & Plan:   Active Problems:   Chronic obstructive pulmonary disease (HCC)   Controlled type 2 diabetes mellitus with diabetic polyneuropathy, without long-term current use of insulin (HCC)   Morbid obesity ([place])   NSTEMI (non-ST elevated myocardial infarction) ([place])  #1.  Non-STEMI. Coronary artery disease with history of CABG and PCI. Patient had another episode of chest pain, radiated to the left arm and shoulder, lasted about 1 hour. I will add Imdur to her regimen. Continue aspirin, Plavix and a beta-blocker.  Continue heparin drip. Patient is scheduled for heart cath on Monday.  2.  Acute on chronic diastolic congestive heart failure ruled in. Reviewed echocardiogram, ejection fraction 50 to 55%. Patient renal function improving after diuretics, I will continue IV Lasix.  Recheck a BMP tomorrow.  3.  Type 2 diabetes with chronic kidney disease stage IIIa. Continue current diabetes regimen.  Renal function is improving.  4.  COPD. Stable.   DVT prophylaxis: Heparin Code Status: full Family Communication: Wife updated at bedside. Disposition Plan:    Status is: Inpatient  Remains inpatient appropriate because:Ongoing diagnostic testing needed not appropriate for outpatient work up, IV treatments appropriate due to intensity of illness or inability to take PO, and Inpatient level  of care appropriate due to severity of illness  Dispo: The patient is from: Home              Anticipated d/c is to: Home              Patient currently is not medically stable to d/c.   Difficult to place patient No        I/O last 3 completed shifts: In: 857.7 [P.O.:356; I.V.:501.7] Out: 2450 [Urine:2450] Total I/O In: 240 [P.O.:240] Out: -      Consultants:  Cardiology  Procedures: None  Antimicrobials: None  Subjective: Patient had episode of chest pain last night, the pain localized in the middle sternum, radiated to the left shoulder and left arm.  7/10 in severity, the pain resolved after giving morphine.  He was also short of breath associate with chest pain. Patient still has some short of breath with worsening, but no short of breath at rest.  Did not have paroxysmal nocturnal dyspnea orthopnea. No abdominal pain or nausea vomiting. No dysuria hematuria pain No fever or chills.  Objective: Vitals:   11/08/20 2330 11/09/20 0503 11/09/20 0855 11/09/20 1152  BP: (!) 133/57 121/61 (!) 128/58 98/62  Pulse:   62 (!) 51  Resp: '20 18 18 17  '$ Temp: 98.3 F (36.8 C) 97.7 F (36.5 C) 98.1 F (36.7 C) 98.2 F (36.8 C)  TempSrc: Oral Oral  Oral  SpO2: 92% 92% 95% 94%  Weight:  120.3 kg    Height:        Intake/Output Summary (Last 24 hours) at 11/09/2020 1158 Last data filed at 11/09/2020 1010 Gross per 24 hour  Intake 1097.67  ml  Output 2450 ml  Net -1352.33 ml   Filed Weights   11/08/20 0424 11/08/20 0744 11/09/20 0503  Weight: 122.5 kg 123 kg 120.3 kg    Examination:  General exam: Appears calm and comfortable  Respiratory system: Clear to auscultation. Respiratory effort normal. Cardiovascular system: S1 & S2 heard, RRR. No JVD, murmurs, rubs, gallops or clicks. No pedal edema. Gastrointestinal system: Abdomen is nondistended, soft and nontender. No organomegaly or masses felt. Normal bowel sounds heard. Central nervous system: Alert and oriented.  No focal neurological deficits. Extremities: Symmetric 5 x 5 power. Skin: No rashes, lesions or ulcers Psychiatry: Judgement and insight appear normal. Mood & affect appropriate.     Data Reviewed: I have personally reviewed following labs and imaging studies  CBC: Recent Labs  Lab 11/08/20 0418 11/09/20 0436  WBC 9.1 9.3  HGB 15.7 14.8  HCT 48.2 45.4  MCV 94.9 94.4  PLT 202 [id number]   Basic Metabolic Panel: Recent Labs  Lab 11/08/20 0418 11/09/20 0436  NA 139 139  K 4.7 4.3  CL 103 100  CO2 32 29  GLUCOSE 176* 109*  BUN 27* 22  CREATININE 1.73* 1.63*  CALCIUM 9.3 9.0  MG  --  1.8   GFR: Estimated Creatinine Clearance: 50.9 mL/min (A) (by C-G formula based on SCr of 1.63 mg/dL (H)). Liver Function Tests: Recent Labs  Lab 11/08/20 0418  AST 16  ALT 11  ALKPHOS 50  BILITOT 0.7  PROT 7.2  ALBUMIN 3.7   No results for input(s): LIPASE, AMYLASE in the last 168 hours. No results for input(s): AMMONIA in the last 168 hours. Coagulation Profile: Recent Labs  Lab 11/08/20 0558  INR 1.1   Cardiac Enzymes: No results for input(s): CKTOTAL, CKMB, CKMBINDEX, TROPONINI in the last 168 hours. BNP (last 3 results) No results for input(s): PROBNP in the last 8760 hours. HbA1C: No results for input(s): HGBA1C in the last 72 hours. CBG: No results for input(s): GLUCAP in the last 168 hours. Lipid Profile: Recent Labs    11/09/20 0436  CHOL 123  HDL 32*  LDLCALC 47  TRIG 219*  CHOLHDL 3.8   Thyroid Function Tests: No results for input(s): TSH, T4TOTAL, FREET4, T3FREE, THYROIDAB in the last 72 hours. Anemia Panel: No results for input(s): VITAMINB12, FOLATE, FERRITIN, TIBC, IRON, RETICCTPCT in the last 72 hours. Sepsis Labs: No results for input(s): PROCALCITON, LATICACIDVEN in the last 168 hours.  Recent Results (from the past 240 hour(s))  Resp Panel by RT-PCR (Flu A&B, Covid) Nasopharyngeal Swab     Status: None   Collection Time: 11/08/20  4:39 AM    Specimen: Nasopharyngeal Swab; Nasopharyngeal(NP) swabs in vial transport medium  Result Value Ref Range Status   SARS Coronavirus 2 by RT PCR NEGATIVE NEGATIVE Final    Comment: (NOTE) SARS-CoV-2 target nucleic acids are NOT DETECTED.  The SARS-CoV-2 RNA is generally detectable in upper respiratory specimens during the acute phase of infection. The lowest concentration of SARS-CoV-2 viral copies this assay can detect is 138 copies/mL. A negative result does not preclude SARS-Cov-2 infection and should not be used as the sole basis for treatment or other patient management decisions. A negative result may occur with  improper specimen collection/handling, submission of specimen other than nasopharyngeal swab, presence of viral mutation(s) within the areas targeted by this assay, and inadequate number of viral copies(<138 copies/mL). A negative result must be combined with clinical observations, patient history, and epidemiological information. The expected result is Negative.  Fact Sheet for Patients:  EntrepreneurPulse.com.au  Fact Sheet for Healthcare Providers:  IncredibleEmployment.be  This test is no t yet approved or cleared by the [place] FDA and  has been authorized for detection and/or diagnosis of SARS-CoV-2 by FDA under an Emergency Use Authorization (EUA). This EUA will remain  in effect (meaning this test can be used) for the duration of the COVID-19 declaration under Section 564(b)(1) of the Act, 21 U.S.C.section 360bbb-3(b)(1), unless the authorization is terminated  or revoked sooner.       Influenza A by PCR NEGATIVE NEGATIVE Final   Influenza B by PCR NEGATIVE NEGATIVE Final    Comment: (NOTE) The Xpert Xpress SARS-CoV-2/FLU/RSV plus assay is intended as an aid in the diagnosis of influenza from Nasopharyngeal swab specimens and should not be used as a sole basis for treatment. Nasal washings and aspirates are  unacceptable for Xpert Xpress SARS-CoV-2/FLU/RSV testing.  Fact Sheet for Patients: EntrepreneurPulse.com.au  Fact Sheet for Healthcare Providers: IncredibleEmployment.be  This test is not yet approved or cleared by the [place] FDA and has been authorized for detection and/or diagnosis of SARS-CoV-2 by FDA under an Emergency Use Authorization (EUA). This EUA will remain in effect (meaning this test can be used) for the duration of the COVID-19 declaration under Section 564(b)(1) of the Act, 21 U.S.C. section 360bbb-3(b)(1), unless the authorization is terminated or revoked.  Performed at [facility], [address], [place], [place] [address]          Radiology Studies: CT Angio Chest PE W and/or Wo Contrast  Result Date: 11/08/2020 CLINICAL DATA:  75 year old male with central chest pain radiating to the back and left shoulder blade. EXAM: CT ANGIOGRAPHY CHEST WITH CONTRAST TECHNIQUE: Multidetector CT imaging of the chest was performed using the standard protocol during bolus administration of intravenous contrast. Multiplanar CT image reconstructions and MIPs were obtained to evaluate the vascular anatomy. CONTRAST:  135m OMNIPAQUE IOHEXOL 350 MG/ML SOLN COMPARISON:  Portable chest 0442 hours today. FINDINGS: Cardiovascular: Good contrast bolus timing in the pulmonary arterial tree. No focal filling defect identified in the pulmonary arteries to suggest acute pulmonary embolism. Prior sternotomy. Calcified aortic atherosclerosis. Cardiomegaly. No pericardial effusion. Mediastinum/Nodes: Postoperative changes with no mediastinal mass or lymphadenopathy. Maximal and mildly asymmetric right hilar lymph nodes. Lungs/Pleura: Volume loss in the right hemithorax with posterior and costophrenic angle pleural thickening and/or intermediate density small pleural collection (series 4, image 84) with faint calcification. Associated right lower  lobe basal segment atelectasis and mild architectural distortion. Major airways remain patent with atelectatic changes. Scattered subpleural scarring in both lungs. No left pleural effusion. Upper Abdomen: Absent gallbladder. Negative visible noncontrast liver, spleen, pancreas, adrenal glands and bowel in the upper abdomen. Partially visible atrophied left kidney. Musculoskeletal: Chronic but ununited sternotomy. T12 wedge compression fracture with largely obliterated T11-T12 disc space, but the T11 inferior endplate remains intact. Interbody ankylosis there and at other thoracic levels in part related to flowing endplate osteophytes. No acute or suspicious osseous lesion identified. Review of the MIP images confirms the above findings. IMPRESSION: 1. Negative for acute pulmonary embolus. 2. Right lung volume loss with lower lobe fibrothorax, or less likely small pleural empyema. Associated right lung atelectasis and architectural distortion. Mild atelectasis and pulmonary scarring elsewhere. 3. Cardiomegaly.  Aortic Atherosclerosis (ICD10-I70.0). 4. Chronic T12 compression fracture. Electronically Signed   By: HGenevie AnnM.D.   On: 11/08/2020 07:30   DG Chest Port 1 View  Result Date: 11/08/2020 CLINICAL DATA:  Chest pain and hypoxemia EXAM: PORTABLE CHEST 1 VIEW COMPARISON:  None. FINDINGS: Previous median sternotomy for CABG procedure. Mild cardiac enlargement. Mild diffuse interstitial edema identified. No airspace opacities. Small right pleural effusion suspected. IMPRESSION: Mild congestive heart failure. Electronically Signed   By: Kerby Moors M.D.   On: 11/08/2020 05:23   ECHOCARDIOGRAM COMPLETE  Result Date: 11/09/2020    ECHOCARDIOGRAM REPORT   Patient Name:   Tony Williams Date of Exam: 11/08/2020 Medical Rec #:  [id number]        Height:       69.0 in Accession #:    [id number]       Weight:       271.1 lb Date of Birth:  1946/01/27        BSA:          2.351 m Patient Age:    47 years          BP:           128/57 mmHg Patient Gender: M                HR:           53 bpm. Exam Location:  [facility] Procedure: 2D Echo and Intracardiac Opacification Agent Indications:     NSTEMI I21.4  History:         Patient has prior history of Echocardiogram examinations, most                  recent 05/22/2020.  Sonographer:     Kathlen Brunswick RDCS Referring Phys:  [id number] [place] Diagnosing Phys: Ida Rogue MD  Sonographer Comments: Technically difficult study due to poor echo windows, suboptimal subcostal window, suboptimal apical window and patient is morbidly obese. IMPRESSIONS  1. Left ventricular ejection fraction, by estimation, is 50 to 55%. The left ventricle has low normal function. The left ventricle demonstrates regional wall motion abnormalities (apical hypokinesis). The left ventricular internal cavity size was mildly  dilated. Left ventricular diastolic parameters are consistent with Grade II diastolic dysfunction (pseudonormalization).  2. Right ventricular systolic function was not well visualized. The right ventricular size is not well visualized.  3. Left atrial size was moderately dilated.  4. Challenging images FINDINGS  Left Ventricle: Left ventricular ejection fraction, by estimation, is 50 to 55%. The left ventricle has low normal function. The left ventricle demonstrates regional wall motion abnormalities. The left ventricular internal cavity size was mildly dilated. There is no left ventricular hypertrophy. Left ventricular diastolic parameters are consistent with Grade II diastolic dysfunction (pseudonormalization). Right Ventricle: The right ventricular size is not well visualized. Right vetricular wall thickness was not well visualized. Right ventricular systolic function was not well visualized. Left Atrium: Left atrial size was moderately dilated. Right Atrium: Right atrial size was normal in size. Pericardium: There is no evidence of pericardial effusion. Mitral Valve: The mitral  valve was not well visualized. No evidence of mitral valve regurgitation. No evidence of mitral valve stenosis. Tricuspid Valve: The tricuspid valve is not well visualized. Tricuspid valve regurgitation is not demonstrated. No evidence of tricuspid stenosis. Aortic Valve: The aortic valve was not well visualized. Aortic valve regurgitation is mild. No aortic stenosis is present. Aortic valve peak gradient measures 4.8 mmHg. Pulmonic Valve: The pulmonic valve was not well visualized. Pulmonic valve regurgitation is not visualized. No evidence of pulmonic stenosis. Aorta: The aortic arch was not well visualized and the ascending aorta was not well visualized. Venous: The pulmonary veins  were not well visualized. The inferior vena cava was not well visualized. The inferior vena cava is normal in size with greater than 50% respiratory variability, suggesting right atrial pressure of 3 mmHg. IAS/Shunts: No atrial level shunt detected by color flow Doppler.  LEFT VENTRICLE PLAX 2D LVIDd:         6.20 cm  Diastology LVIDs:         5.20 cm  LV e' medial:  5.87 cm/s LV PW:         1.10 cm  LV e' lateral: 7.18 cm/s LV IVS:        0.80 cm LVOT diam:     2.10 cm LV SV:         74 LV SV Index:   32 LVOT Area:     3.46 cm  LEFT ATRIUM             Index LA diam:        6.00 cm 2.55 cm/m LA Vol (A2C):   40.1 ml 17.06 ml/m LA Vol (A4C):   43.1 ml 18.33 ml/m LA Biplane Vol: 42.4 ml 18.03 ml/m  AORTIC VALVE AV Area (Vmax): 3.37 cm AV Vmax:        109.00 cm/s AV Peak Grad:   4.8 mmHg LVOT Vmax:      106.00 cm/s LVOT Vmean:     61.200 cm/s LVOT VTI:       0.215 m  AORTA Ao Root diam: 3.40 cm  SHUNTS Systemic VTI:  0.22 m Systemic Diam: 2.10 cm Ida Rogue MD Electronically signed by Ida Rogue MD Signature Date/Time: 11/09/2020/10:33:40 AM    Final         Scheduled Meds:  allopurinol  300 mg Oral Daily   amLODipine  10 mg Oral Daily   aspirin EC  81 mg Oral Daily   atorvastatin  80 mg Oral Daily   cholecalciferol   4,000 Units Oral Daily   clopidogrel  75 mg Oral Daily   escitalopram  20 mg Oral Daily   fluocinonide cream   Topical BID   furosemide  40 mg Intravenous Q12H   gabapentin  600 mg Oral QHS   metoprolol tartrate  25 mg Oral Daily   omega-3 acid ethyl esters  1 g Oral BID   Continuous Infusions:  heparin 1,900 Units/hr (11/09/20 0609)     LOS: 1 day    Time spent: 32 minutes, more than 50% time involved in direct patient care.    Sharen Hones, MD [facility]   To contact the attending provider between 7A-7P or the covering provider during after hours 7P-7A, please log into the web site [email or web address] and access using universal [place] password for that web site. If you do not have the password, please call the hospital operator.  11/09/2020, 11:58 AM

## 2020-11-10 DIAGNOSIS — J449 Chronic obstructive pulmonary disease, unspecified: Secondary | ICD-10-CM | POA: Diagnosis not present

## 2020-11-10 DIAGNOSIS — I214 Non-ST elevation (NSTEMI) myocardial infarction: Secondary | ICD-10-CM | POA: Diagnosis not present

## 2020-11-10 DIAGNOSIS — I5033 Acute on chronic diastolic (congestive) heart failure: Secondary | ICD-10-CM

## 2020-11-10 LAB — CBC
HCT: 44.1 % (ref 39.0–52.0)
Hemoglobin: 14.3 g/dL (ref 13.0–17.0)
MCH: 30.6 pg (ref 26.0–34.0)
MCHC: 32.4 g/dL (ref 30.0–36.0)
MCV: 94.4 fL (ref 80.0–100.0)
Platelets: 173 10*3/uL (ref 150–400)
RBC: 4.67 MIL/uL (ref 4.22–5.81)
RDW: 15.4 % (ref 11.5–15.5)
WBC: 9.2 10*3/uL (ref 4.0–10.5)
nRBC: 0 % (ref 0.0–0.2)

## 2020-11-10 LAB — BASIC METABOLIC PANEL
Anion gap: 6 (ref 5–15)
BUN: 34 mg/dL — ABNORMAL HIGH (ref 8–23)
CO2: 32 mmol/L (ref 22–32)
Calcium: 8.9 mg/dL (ref 8.9–10.3)
Chloride: 99 mmol/L (ref 98–111)
Creatinine, Ser: 2.03 mg/dL — ABNORMAL HIGH (ref 0.61–1.24)
GFR, Estimated: 34 mL/min — ABNORMAL LOW (ref >60)
Glucose, Bld: 122 mg/dL — ABNORMAL HIGH (ref 70–99)
Potassium: 4.4 mmol/L (ref 3.5–5.1)
Sodium: 137 mmol/L (ref 135–145)

## 2020-11-10 LAB — HEPARIN LEVEL (UNFRACTIONATED)
Heparin Unfractionated: 0.71 IU/mL — ABNORMAL HIGH (ref 0.30–0.70)
Heparin Unfractionated: 0.64 IU/mL (ref 0.30–0.70)

## 2020-11-10 LAB — TROPONIN I (HIGH SENSITIVITY): Troponin I (High Sensitivity): 1117 ng/L (ref <18)

## 2020-11-10 LAB — MAGNESIUM: Magnesium: 1.8 mg/dL (ref 1.7–2.4)

## 2020-11-10 MED ORDER — SODIUM CHLORIDE 0.9 % IV SOLN: INTRAVENOUS | Status: DC

## 2020-11-10 NOTE — Progress Notes (Signed)
Progress Note  Patient Name: Tony Williams Date of Encounter: 11/10/2020  Primary Cardiologist: Agbor-Etang  Subjective   Continues to note intermittent chest and left arm discomfort, along with intermittent dizziness. Currently, asymptomatic.   He remains on supplemental oxygen via nasal cannula at 2.5 L. BUN/SCr 22/1.63 --> 34/2.03 over the past 24 hours. Documented + 234 mL for the past 24 hours, net - 1.3 L for the admission. Weight unchanged at 120.3 kg over the past 24 hours. He remains on IV Lasix 40 mg bid and has received his dose this morning.   Inpatient Medications    Scheduled Meds:  allopurinol  300 mg Oral Daily   amLODipine  10 mg Oral Daily   aspirin EC  81 mg Oral Daily   atorvastatin  80 mg Oral Daily   cholecalciferol  4,000 Units Oral Daily   clopidogrel  75 mg Oral Daily   escitalopram  20 mg Oral Daily   fluocinonide cream   Topical BID   furosemide  40 mg Intravenous Q12H   gabapentin  600 mg Oral QHS   isosorbide mononitrate  15 mg Oral Daily   metoprolol tartrate  25 mg Oral Daily   omega-3 acid ethyl esters  1 g Oral BID   Continuous Infusions:  heparin 1,900 Units/hr (11/09/20 1925)   PRN Meds: acetaminophen **OR** acetaminophen, albuterol, fentaNYL (SUBLIMAZE) injection, HYDROcodone-acetaminophen, magnesium hydroxide, nitroGLYCERIN, ondansetron **OR** ondansetron (ZOFRAN) IV, traZODone   Vital Signs    Vitals:   11/10/20 0054 11/10/20 0511 11/10/20 0517 11/10/20 0838  BP: 116/60  131/62 (!) 143/62  Pulse: (!) 55  (!) 59 60  Resp: '18  18 18  '$ Temp: 98.4 F (36.9 C)  98.2 F (36.8 C)   TempSrc: Oral  Oral   SpO2: 92%  94% 95%  Weight:  120.3 kg    Height:  '5\' 9"'$  (1.753 m)      Intake/Output Summary (Last 24 hours) at 11/10/2020 0840 Last data filed at 11/10/2020 0250 Gross per 24 hour  Intake 1184.27 ml  Output 950 ml  Net 234.27 ml   Filed Weights   11/08/20 0744 11/09/20 0503 11/10/20 0511  Weight: 123 kg 120.3 kg 120.3 kg     Telemetry    SR with brief episodes of 2:1 AV block overnight, 1st degree AV block - Personally Reviewed  ECG    No new tracings - Personally Reviewed  Physical Exam   GEN: No acute distress.   Neck: No JVD. Cardiac: RRR, no murmurs, rubs, or gallops.  Respiratory: Clear to auscultation bilaterally.  GI: Soft, nontender, non-distended.   MS: No edema; No deformity. Neuro:  Alert and oriented x 3; Nonfocal.  Psych: Normal affect.  Labs    Chemistry Recent Labs  Lab 11/08/20 0418 11/09/20 0436 11/10/20 0608  NA 139 139 137  K 4.7 4.3 4.4  CL 103 100 99  CO2 32 29 32  GLUCOSE 176* 109* 122*  BUN 27* 22 34*  CREATININE 1.73* 1.63* 2.03*  CALCIUM 9.3 9.0 8.9  PROT 7.2  --   --   ALBUMIN 3.7  --   --   AST 16  --   --   ALT 11  --   --   ALKPHOS 50  --   --   BILITOT 0.7  --   --   GFRNONAA 41* 44* 34*  ANIONGAP 4* 10 6     Hematology Recent Labs  Lab 11/08/20 0418 11/09/20 0436 11/10/20  [id number]  WBC 9.1 9.3 9.2  RBC 5.08 4.81 4.67  HGB 15.7 14.8 14.3  HCT 48.2 45.4 44.1  MCV 94.9 94.4 94.4  MCH 30.9 30.8 30.6  MCHC 32.6 32.6 32.4  RDW 15.2 15.0 15.4  PLT 202 187 173    Cardiac EnzymesNo results for input(s): TROPONINI in the last 168 hours. No results for input(s): TROPIPOC in the last 168 hours.   BNP Recent Labs  Lab 11/08/20 0558  BNP 138.7*     DDimer No results for input(s): DDIMER in the last 168 hours.   Radiology    CTA chest 11/08/2020: IMPRESSION: 1. Negative for acute pulmonary embolus. 2. Right lung volume loss with lower lobe fibrothorax, or less likely small pleural empyema. Associated right lung atelectasis and architectural distortion. Mild atelectasis and pulmonary scarring elsewhere. 3. Cardiomegaly.  Aortic Atherosclerosis (ICD10-I70.0). 4. Chronic T12 compression fracture. __________  CXR 11/08/2020: IMPRESSION: Mild congestive heart failure.  Cardiac Studies   2D echo 11/08/2020:  1. Left ventricular ejection  fraction, by estimation, is 50 to 55%. The  left ventricle has low normal function. The left ventricle demonstrates  regional wall motion abnormalities (apical hypokinesis). The left  ventricular internal cavity size was mildly   dilated. Left ventricular diastolic parameters are consistent with Grade  II diastolic dysfunction (pseudonormalization).   2. Right ventricular systolic function was not well visualized. The right  ventricular size is not well visualized.   3. Left atrial size was moderately dilated.   4. Challenging images  __________  2D echo 05/22/2020: 1. Left ventricular ejection fraction, by estimation, is 60 to 65%. The  left ventricle has normal function. The left ventricle has no regional  wall motion abnormalities. Left ventricular diastolic parameters are  indeterminate.   2. Right ventricular systolic function is low normal. The right  ventricular size is not well visualized.   3. The mitral valve is normal in structure. No evidence of mitral valve  regurgitation.   4. The aortic valve was not well visualized. Aortic valve regurgitation  is trivial. __________  Carlton Adam MPI 05/21/2020: T wave inversion was noted during stress in the aVL leads. There was no ST segment deviation noted during stress. Defect 1: There is a large defect of severe severity present in the basal inferior, basal inferolateral, mid inferior and mid inferolateral location. Findings consistent with prior myocardial infarction with moderate peri-infarct ischemia. This is an intermediate risk study. Nuclear stress EF: 45%. CT attenuation images show evidence of moderate aortic and coronary calcifications.    This result has not been signed. Information might be incomplete.    Patient Profile     75 y.o. male with history of CAD s/p 4-vessel CABG in 2007 with MI in 2018 s/p PCI x 2, CKD stage III, DM2, HTN, HLD, possible COPD with prior tobacco use, obesity, and OSA who we are seeing for  NSTEMI.   Assessment & Plan    1. CAD s/p CABG s/p PCI with NSTEMI: -Currently, chest pain free -High sensitivity troponin has trended to 455, cycle to peak -ASA -Heparin gtt  -Lipitor (historically intolerant to statins), resume PCSK9i at discharge -Imdur -Hold Lopressor as below -Echo this admission with preserved LVSF and normal wall motion -Will need R/LHC once renal function improves -Gentle IV fluids today given AKI in preparation for cath, hopefully 8/30 -Risks and benefits of cardiac catheterization have been discussed with the patient including risks of bleeding, bruising, infection, kidney damage, stroke, heart attack, urgent need for bypass  surgery, injury to a limb, and death. The patient understands these risks and is willing to proceed with the procedure. All questions have been answered and concerns listened to  2. HFpEF: -Hold IV Lasix as below given AKI -CHF education  -Strict I/O -Daily standing scale weights   3. Acute on CKD stage III: -Hold IV Lasix (last dose this morning) -Gentle IV fluids in preparation for [facility] tomorrow, hopefully   4. HTN: -Blood pressure reasonably controlled  -Amlodipine, and Imdur -Lisinopril held with acute on CKD  5. HLD: -LDL 47 -Historically intolerant to statins -Started on Lipitor this admission in place of PTA lovastatin  -At discharge, continue PCSK9i  6. DM2: -A1c 5.9 earlier this month -SSI per IM  7. Obesity with OSA: -Weight loss recommended through heart healthy diet and exercise  -CPAP  8. 2:1 AV block: -Very brief episode noted on telemetry overnight -Uncertain if this is contributing to his intermittent dizziness -Hold beta blocker   For questions or updates, please contact [facility] Please consult [email or web address] for contact info under Cardiology/STEMI.    Signed, Christell Faith, PA-C [place] Pager: [phone] 11/10/2020, 8:40 AM

## 2020-11-10 NOTE — Progress Notes (Signed)
ANTICOAGULATION CONSULT NOTE   Pharmacy Consult for heparin infusion Indication: ACS/STEMI  Allergies  Allergen Reactions   Morphine    Simvastatin    Stadol [Butorphanol] Itching    Patient Measurements: Height: '5\' 9"'$  (175.3 cm) Weight: 120.3 kg (265 lb 3.2 oz) IBW/kg (Calculated) : 70.7 Heparin Dosing Weight: 122.5 kg  Vital Signs: Temp: 97.9 F (36.6 C) (08/29 1618) Temp Source: Oral (08/29 1144) BP: 112/46 (08/29 1618) Pulse Rate: 63 (08/29 1618)  Labs: Recent Labs    11/08/20 0418 11/08/20 0558 11/08/20 0616 11/08/20 1359 11/09/20 0436 11/10/20 0608 11/10/20 0917 11/10/20 1623  HGB 15.7  --   --   --  14.8 14.3  --   --   HCT 48.2  --   --   --  45.4 44.1  --   --   PLT 202  --   --   --  187 173  --   --   APTT  --  30  --   --   --   --   --   --   LABPROT  --  13.9  --   --   --   --   --   --   INR  --  1.1  --   --   --   --   --   --   HEPARINUNFRC  --   --   --    < > 0.45 0.71*  --  0.64  CREATININE 1.73*  --   --   --  1.63* 2.03*  --   --   TROPONINIHS 376*  --  455*  --   --   --  1,117*  --    < > = values in this interval not displayed.     Estimated Creatinine Clearance: 40.9 mL/min (A) (by C-G formula based on SCr of 2.03 mg/dL (H)).   Medical History: Past Medical History:  Diagnosis Date   Cancer ([place])    Chronic kidney disease    COPD (chronic obstructive pulmonary disease) ([place])    Depression    Diabetes mellitus without complication ([place])    H/O heart artery stent    Heart problem     Assessment: Pt is 75 yo male w/ hx of quad bypass & MI, presenting to ED with CP radiating to L side/arm & back and initial Troponin I = 376.   8/27'@1359'$ : HL 0.28 subtherapeutic 8/27@ 2347 HL 0.47, therapeutic 8/28@ 0436 HL 0.45, therapeutic x 2 8/29'@0608'$  HL 0.71, supratherapeutic 8/29'@1623'$  HL 0.64, therapeutic x1  Goal of Therapy:  Heparin level 0.3-0.7 units/ml Monitor platelets by anticoagulation protocol: Yes   Plan:  HL 0.64,  therapeutic. Will continue heparin infusion at 1800 units/hr Recheck HL in 8 hrs CBC daily while on heparin.  Coralie Carpen, PharmD Clinical Pharmacist 11/10/2020

## 2020-11-10 NOTE — Progress Notes (Signed)
ANTICOAGULATION CONSULT NOTE   Pharmacy Consult for heparin infusion Indication: ACS/STEMI  Allergies  Allergen Reactions   Morphine    Simvastatin    Stadol [Butorphanol] Itching    Patient Measurements: Height: '5\' 9"'$  (175.3 cm) Weight: 120.3 kg (265 lb 3.2 oz) IBW/kg (Calculated) : 70.7 Heparin Dosing Weight: 122.5 kg  Vital Signs: Temp: 97.9 F (36.6 C) (08/29 1618) Temp Source: Oral (08/29 1144) BP: 112/46 (08/29 1618) Pulse Rate: 63 (08/29 1618)  Labs: Recent Labs    11/08/20 0418 11/08/20 0558 11/08/20 0616 11/08/20 1359 11/09/20 0436 11/10/20 0608 11/10/20 0917 11/10/20 1623  HGB 15.7  --   --   --  14.8 14.3  --   --   HCT 48.2  --   --   --  45.4 44.1  --   --   PLT 202  --   --   --  187 173  --   --   APTT  --  30  --   --   --   --   --   --   LABPROT  --  13.9  --   --   --   --   --   --   INR  --  1.1  --   --   --   --   --   --   HEPARINUNFRC  --   --   --    < > 0.45 0.71*  --  0.64  CREATININE 1.73*  --   --   --  1.63* 2.03*  --   --   TROPONINIHS 376*  --  455*  --   --   --  1,117*  --    < > = values in this interval not displayed.     Estimated Creatinine Clearance: 40.9 mL/min (A) (by C-G formula based on SCr of 2.03 mg/dL (H)).   Medical History: Past Medical History:  Diagnosis Date   Cancer ([place])    Chronic kidney disease    COPD (chronic obstructive pulmonary disease) ([place])    Depression    Diabetes mellitus without complication ([place])    H/O heart artery stent    Heart problem     Assessment: Pt is 75 yo male w/ hx of quad bypass & MI, presenting to ED with CP radiating to L side/arm & back and initial Troponin I = 376.   8/27'@1359'$ : HL 0.28 subtherapeutic 8/27@ 2347 HL 0.47, therapeutic 8/28@ 0436 HL 0.45, therapeutic x 2 8/29 '@0608'$  HL 0.71, supratherapeutic 8/29'@1623'$  HL 0.64, Therapeutic  Goal of Therapy:  Heparin level 0.3-0.7 units/ml Monitor platelets by anticoagulation protocol: Yes   Plan:  8/29 '@1623'$   0.64, therapeutic conitnue heparin infusion to 1800 units/hr Recheck HL in 8 hrs '@0100'$  CBC daily while on heparin.  Chinita Greenland PharmD Clinical Pharmacist 11/10/2020

## 2020-11-10 NOTE — Progress Notes (Signed)
PROGRESS NOTE    Tony Williams  [id number] DOB: Mar 04, 1946 DOA: 11/08/2020 PCP: Virginia Crews, MD   Chief complaint.  Chest pain. Brief Narrative:   Tony Williams is a 75 y.o. Caucasian male with medical history significant for coronary artery disease status post four-vessel CABG, status post PCI and 2 stents, COPD, depression, type 2 diabetes mellitus and stage IIIa chronic kidney disease, who presented to emergency room with acute onset of midsternal chest pain.  EKG had no ST elevation, peak troponin 455.  He has been seen by cardiology, placed on heparin drip.  Assessment & Plan:   Active Problems:   Chronic obstructive pulmonary disease (HCC)   Controlled type 2 diabetes mellitus with diabetic polyneuropathy, without long-term current use of insulin (HCC)   Morbid obesity ([place])   NSTEMI (non-ST elevated myocardial infarction) ([place])  #1.  Non-STEMI. Coronary artery disease with history of CABG and PCI. Acute on chronic diastolic congestive heart failure Patient had on and off chest pain yesterday, troponin has increased.  Due to worsening renal function, heart catheter scheduled for tomorrow. Followed by cardiology.  2.  Acute kidney injury on chronic kidney disease stage IIIa. Currently, patient no longer has any volume overload.  Cardiology had placed patient on IV fluids.  We will continue to follow.  3.  Type 2 diabetes. Continue current regimen for  4.  COPD. Stable.     DVT prophylaxis: Heparin Code Status: full Family Communication: Wife at the bedside Disposition Plan:    Status is: Inpatient  Remains inpatient appropriate because:IV treatments appropriate due to intensity of illness or inability to take PO and Inpatient level of care appropriate due to severity of illness  Dispo: The patient is from: Home              Anticipated d/c is to: Home              Patient currently is not medically stable to d/c.   Difficult to place patient  No        I/O last 3 completed shifts: In: 1647.5 [P.O.:1136; I.V.:511.5] Out: 2700 [Urine:2700] No intake/output data recorded.     Consultants:  Cardiology  Procedures: None  Antimicrobials: None  Subjective: Patient had a several episode of chest pain yesterday, each time lasted a half an hour to 1 hour.  Some short of breath. He slept well last night flat.  No paroxysmal nocturnal dyspnea. He denies any abdominal pain or nausea vomiting. No fever or chills. No dysuria or hematuria.  Objective: Vitals:   11/10/20 0511 11/10/20 0517 11/10/20 0838 11/10/20 1144  BP:  131/62 (!) 143/62 (!) 137/56  Pulse:  (!) 59 60 66  Resp:  '18 18 17  '$ Temp:  98.2 F (36.8 C)  97.8 F (36.6 C)  TempSrc:  Oral  Oral  SpO2:  94% 95% 94%  Weight: 120.3 kg     Height: '5\' 9"'$  (1.753 m)       Intake/Output Summary (Last 24 hours) at 11/10/2020 1232 Last data filed at 11/10/2020 0949 Gross per 24 hour  Intake 944.27 ml  Output 950 ml  Net -5.73 ml   Filed Weights   11/08/20 0744 11/09/20 0503 11/10/20 0511  Weight: 123 kg 120.3 kg 120.3 kg    Examination:  General exam: Appears calm and comfortable  Respiratory system: Clear to auscultation. Respiratory effort normal. Cardiovascular system: S1 & S2 heard, RRR. No JVD, murmurs, rubs, gallops or clicks. No pedal edema. Gastrointestinal system: Abdomen  is nondistended, soft and nontender. No organomegaly or masses felt. Normal bowel sounds heard. Central nervous system: Alert and oriented. No focal neurological deficits. Extremities: Symmetric 5 x 5 power. Skin: No rashes, lesions or ulcers Psychiatry: Judgement and insight appear normal. Mood & affect appropriate.     Data Reviewed: I have personally reviewed following labs and imaging studies  CBC: Recent Labs  Lab 11/08/20 0418 11/09/20 0436 11/10/20 0608  WBC 9.1 9.3 9.2  HGB 15.7 14.8 14.3  HCT 48.2 45.4 44.1  MCV 94.9 94.4 94.4  PLT 202 187 [id number]   Basic  Metabolic Panel: Recent Labs  Lab 11/08/20 0418 11/09/20 0436 11/10/20 0608  NA 139 139 137  K 4.7 4.3 4.4  CL 103 100 99  CO2 32 29 32  GLUCOSE 176* 109* 122*  BUN 27* 22 34*  CREATININE 1.73* 1.63* 2.03*  CALCIUM 9.3 9.0 8.9  MG  --  1.8 1.8   GFR: Estimated Creatinine Clearance: 40.9 mL/min (A) (by C-G formula based on SCr of 2.03 mg/dL (H)). Liver Function Tests: Recent Labs  Lab 11/08/20 0418  AST 16  ALT 11  ALKPHOS 50  BILITOT 0.7  PROT 7.2  ALBUMIN 3.7   No results for input(s): LIPASE, AMYLASE in the last 168 hours. No results for input(s): AMMONIA in the last 168 hours. Coagulation Profile: Recent Labs  Lab 11/08/20 0558  INR 1.1   Cardiac Enzymes: No results for input(s): CKTOTAL, CKMB, CKMBINDEX, TROPONINI in the last 168 hours. BNP (last 3 results) No results for input(s): PROBNP in the last 8760 hours. HbA1C: No results for input(s): HGBA1C in the last 72 hours. CBG: No results for input(s): GLUCAP in the last 168 hours. Lipid Profile: Recent Labs    11/09/20 0436  CHOL 123  HDL 32*  LDLCALC 47  TRIG 219*  CHOLHDL 3.8   Thyroid Function Tests: No results for input(s): TSH, T4TOTAL, FREET4, T3FREE, THYROIDAB in the last 72 hours. Anemia Panel: No results for input(s): VITAMINB12, FOLATE, FERRITIN, TIBC, IRON, RETICCTPCT in the last 72 hours. Sepsis Labs: No results for input(s): PROCALCITON, LATICACIDVEN in the last 168 hours.  Recent Results (from the past 240 hour(s))  Resp Panel by RT-PCR (Flu A&B, Covid) Nasopharyngeal Swab     Status: None   Collection Time: 11/08/20  4:39 AM   Specimen: Nasopharyngeal Swab; Nasopharyngeal(NP) swabs in vial transport medium  Result Value Ref Range Status   SARS Coronavirus 2 by RT PCR NEGATIVE NEGATIVE Final    Comment: (NOTE) SARS-CoV-2 target nucleic acids are NOT DETECTED.  The SARS-CoV-2 RNA is generally detectable in upper respiratory specimens during the acute phase of infection. The  lowest concentration of SARS-CoV-2 viral copies this assay can detect is 138 copies/mL. A negative result does not preclude SARS-Cov-2 infection and should not be used as the sole basis for treatment or other patient management decisions. A negative result may occur with  improper specimen collection/handling, submission of specimen other than nasopharyngeal swab, presence of viral mutation(s) within the areas targeted by this assay, and inadequate number of viral copies(<138 copies/mL). A negative result must be combined with clinical observations, patient history, and epidemiological information. The expected result is Negative.  Fact Sheet for Patients:  EntrepreneurPulse.com.au  Fact Sheet for Healthcare Providers:  IncredibleEmployment.be  This test is no t yet approved or cleared by the [place] FDA and  has been authorized for detection and/or diagnosis of SARS-CoV-2 by FDA under an Emergency Use Authorization (EUA). This EUA  will remain  in effect (meaning this test can be used) for the duration of the COVID-19 declaration under Section 564(b)(1) of the Act, 21 U.S.C.section 360bbb-3(b)(1), unless the authorization is terminated  or revoked sooner.       Influenza A by PCR NEGATIVE NEGATIVE Final   Influenza B by PCR NEGATIVE NEGATIVE Final    Comment: (NOTE) The Xpert Xpress SARS-CoV-2/FLU/RSV plus assay is intended as an aid in the diagnosis of influenza from Nasopharyngeal swab specimens and should not be used as a sole basis for treatment. Nasal washings and aspirates are unacceptable for Xpert Xpress SARS-CoV-2/FLU/RSV testing.  Fact Sheet for Patients: EntrepreneurPulse.com.au  Fact Sheet for Healthcare Providers: IncredibleEmployment.be  This test is not yet approved or cleared by the [place] FDA and has been authorized for detection and/or diagnosis of SARS-CoV-2 by FDA under  an Emergency Use Authorization (EUA). This EUA will remain in effect (meaning this test can be used) for the duration of the COVID-19 declaration under Section 564(b)(1) of the Act, 21 U.S.C. section 360bbb-3(b)(1), unless the authorization is terminated or revoked.  Performed at [facility], [address] [place], [place] [address]          Radiology Studies: ECHOCARDIOGRAM COMPLETE  Result Date: 11/09/2020    ECHOCARDIOGRAM REPORT   Patient Name:   Tony Williams Date of Exam: 11/08/2020 Medical Rec #:  [id number]        Height:       69.0 in Accession #:    [id number]       Weight:       271.1 lb Date of Birth:  Sep 11, 1945        BSA:          2.351 m Patient Age:    41 years         BP:           128/57 mmHg Patient Gender: M                HR:           53 bpm. Exam Location:  [facility] Procedure: 2D Echo and Intracardiac Opacification Agent Indications:     NSTEMI I21.4  History:         Patient has prior history of Echocardiogram examinations, most                  recent 05/22/2020.  Sonographer:     Kathlen Brunswick RDCS Referring Phys:  [id number] [place] Diagnosing Phys: Ida Rogue MD  Sonographer Comments: Technically difficult study due to poor echo windows, suboptimal subcostal window, suboptimal apical window and patient is morbidly obese. IMPRESSIONS  1. Left ventricular ejection fraction, by estimation, is 50 to 55%. The left ventricle has low normal function. The left ventricle demonstrates regional wall motion abnormalities (apical hypokinesis). The left ventricular internal cavity size was mildly  dilated. Left ventricular diastolic parameters are consistent with Grade II diastolic dysfunction (pseudonormalization).  2. Right ventricular systolic function was not well visualized. The right ventricular size is not well visualized.  3. Left atrial size was moderately dilated.  4. Challenging images FINDINGS  Left Ventricle: Left ventricular ejection fraction, by  estimation, is 50 to 55%. The left ventricle has low normal function. The left ventricle demonstrates regional wall motion abnormalities. The left ventricular internal cavity size was mildly dilated. There is no left ventricular hypertrophy. Left ventricular diastolic parameters are consistent with Grade II diastolic dysfunction (pseudonormalization). Right Ventricle: The right  ventricular size is not well visualized. Right vetricular wall thickness was not well visualized. Right ventricular systolic function was not well visualized. Left Atrium: Left atrial size was moderately dilated. Right Atrium: Right atrial size was normal in size. Pericardium: There is no evidence of pericardial effusion. Mitral Valve: The mitral valve was not well visualized. No evidence of mitral valve regurgitation. No evidence of mitral valve stenosis. Tricuspid Valve: The tricuspid valve is not well visualized. Tricuspid valve regurgitation is not demonstrated. No evidence of tricuspid stenosis. Aortic Valve: The aortic valve was not well visualized. Aortic valve regurgitation is mild. No aortic stenosis is present. Aortic valve peak gradient measures 4.8 mmHg. Pulmonic Valve: The pulmonic valve was not well visualized. Pulmonic valve regurgitation is not visualized. No evidence of pulmonic stenosis. Aorta: The aortic arch was not well visualized and the ascending aorta was not well visualized. Venous: The pulmonary veins were not well visualized. The inferior vena cava was not well visualized. The inferior vena cava is normal in size with greater than 50% respiratory variability, suggesting right atrial pressure of 3 mmHg. IAS/Shunts: No atrial level shunt detected by color flow Doppler.  LEFT VENTRICLE PLAX 2D LVIDd:         6.20 cm  Diastology LVIDs:         5.20 cm  LV e' medial:  5.87 cm/s LV PW:         1.10 cm  LV e' lateral: 7.18 cm/s LV IVS:        0.80 cm LVOT diam:     2.10 cm LV SV:         74 LV SV Index:   32 LVOT Area:      3.46 cm  LEFT ATRIUM             Index LA diam:        6.00 cm 2.55 cm/m LA Vol (A2C):   40.1 ml 17.06 ml/m LA Vol (A4C):   43.1 ml 18.33 ml/m LA Biplane Vol: 42.4 ml 18.03 ml/m  AORTIC VALVE AV Area (Vmax): 3.37 cm AV Vmax:        109.00 cm/s AV Peak Grad:   4.8 mmHg LVOT Vmax:      106.00 cm/s LVOT Vmean:     61.200 cm/s LVOT VTI:       0.215 m  AORTA Ao Root diam: 3.40 cm  SHUNTS Systemic VTI:  0.22 m Systemic Diam: 2.10 cm Ida Rogue MD Electronically signed by Ida Rogue MD Signature Date/Time: 11/09/2020/10:33:40 AM    Final         Scheduled Meds:  allopurinol  300 mg Oral Daily   amLODipine  10 mg Oral Daily   aspirin EC  81 mg Oral Daily   atorvastatin  80 mg Oral Daily   cholecalciferol  4,000 Units Oral Daily   clopidogrel  75 mg Oral Daily   escitalopram  20 mg Oral Daily   fluocinonide cream   Topical BID   gabapentin  600 mg Oral QHS   isosorbide mononitrate  15 mg Oral Daily   omega-3 acid ethyl esters  1 g Oral BID   Continuous Infusions:  sodium chloride     heparin 1,800 Units/hr (11/10/20 0853)     LOS: 2 days    Time spent: 28 minutes    Sharen Hones, MD [facility]   To contact the attending provider between 7A-7P or the covering provider during after hours 7P-7A, please log into the web  site [email or web address] and access using universal [place] password for that web site. If you do not have the password, please call the hospital operator.  11/10/2020, 12:32 PM

## 2020-11-10 NOTE — Progress Notes (Signed)
ANTICOAGULATION CONSULT NOTE   Pharmacy Consult for heparin infusion Indication: ACS/STEMI  Allergies  Allergen Reactions   Morphine    Simvastatin    Stadol [Butorphanol] Itching    Patient Measurements: Height: '5\' 9"'$  (175.3 cm) Weight: 120.3 kg (265 lb 3.2 oz) IBW/kg (Calculated) : 70.7 Heparin Dosing Weight: 122.5 kg  Vital Signs: Temp: 98.2 F (36.8 C) (08/29 0517) Temp Source: Oral (08/29 0517) BP: 143/62 (08/29 0838) Pulse Rate: 60 (08/29 0838)  Labs: Recent Labs    11/08/20 0418 11/08/20 0558 11/08/20 [id number] 11/08/20 1359 11/08/20 2347 11/09/20 0436 11/10/20 0608 11/10/20 0917  HGB 15.7  --   --   --   --  14.8 14.3  --   HCT 48.2  --   --   --   --  45.4 44.1  --   PLT 202  --   --   --   --  187 173  --   APTT  --  30  --   --   --   --   --   --   LABPROT  --  13.9  --   --   --   --   --   --   INR  --  1.1  --   --   --   --   --   --   HEPARINUNFRC  --   --   --    < > 0.47 0.45 0.71*  --   CREATININE 1.73*  --   --   --   --  1.63* 2.03*  --   TROPONINIHS 376*  --  455*  --   --   --   --  1,117*   < > = values in this interval not displayed.     Estimated Creatinine Clearance: 40.9 mL/min (A) (by C-G formula based on SCr of 2.03 mg/dL (H)).   Medical History: Past Medical History:  Diagnosis Date   Cancer ([place])    Chronic kidney disease    COPD (chronic obstructive pulmonary disease) ([place])    Depression    Diabetes mellitus without complication ([place])    H/O heart artery stent    Heart problem     Assessment: Pt is 75 yo male w/ hx of quad bypass & MI, presenting to ED with CP radiating to L side/arm & back and initial Troponin I = 376.   8/27'@1359'$ : HL 0.28 subtherapeutic 8/27@ 2347 HL 0.47, therapeutic 8/28@ 0436 HL 0.45, therapeutic x 2 8/29 '@0608'$  HL 0.71, supratherapeutic  Goal of Therapy:  Heparin level 0.3-0.7 units/ml Monitor platelets by anticoagulation protocol: Yes   Plan:  8/29 '@0608'$  HL 0.71, supratherapeutic Decrease  heparin infusion to 1800 units/hr Recheck HL in 8 hrs CBC daily while on heparin.  Chinita Greenland PharmD Clinical Pharmacist 11/10/2020

## 2020-11-11 ENCOUNTER — Other Ambulatory Visit: Payer: Self-pay

## 2020-11-11 ENCOUNTER — Inpatient Hospital Stay (HOSPITAL_COMMUNITY): Payer: Medicare Other

## 2020-11-11 ENCOUNTER — Encounter (HOSPITAL_COMMUNITY): Payer: Self-pay | Admitting: Cardiovascular Disease

## 2020-11-11 ENCOUNTER — Encounter
Admission: EM | Disposition: A | Discharge status: Other Acute Inpatient Hospital | Payer: Self-pay | Source: Home / Self Care | Attending: Family Medicine

## 2020-11-11 ENCOUNTER — Inpatient Hospital Stay (HOSPITAL_COMMUNITY)
Admission: AD | Admit: 2020-11-11 | Discharge: 2020-11-14 | DRG: 246 | Disposition: A | Discharge status: Home / Self Care | Payer: Medicare Other | Source: Other Acute Inpatient Hospital | Attending: Cardiovascular Disease | Admitting: Cardiovascular Disease

## 2020-11-11 DIAGNOSIS — Z885 Allergy status to narcotic agent status: Secondary | ICD-10-CM

## 2020-11-11 DIAGNOSIS — I152 Hypertension secondary to endocrine disorders: Secondary | ICD-10-CM | POA: Diagnosis present

## 2020-11-11 DIAGNOSIS — I441 Atrioventricular block, second degree: Secondary | ICD-10-CM | POA: Diagnosis present

## 2020-11-11 DIAGNOSIS — M549 Dorsalgia, unspecified: Secondary | ICD-10-CM | POA: Diagnosis present

## 2020-11-11 DIAGNOSIS — Z87891 Personal history of nicotine dependence: Secondary | ICD-10-CM | POA: Diagnosis not present

## 2020-11-11 DIAGNOSIS — F32A Depression, unspecified: Secondary | ICD-10-CM | POA: Diagnosis present

## 2020-11-11 DIAGNOSIS — I25810 Atherosclerosis of coronary artery bypass graft(s) without angina pectoris: Secondary | ICD-10-CM

## 2020-11-11 DIAGNOSIS — E1169 Type 2 diabetes mellitus with other specified complication: Secondary | ICD-10-CM | POA: Diagnosis present

## 2020-11-11 DIAGNOSIS — I2510 Atherosclerotic heart disease of native coronary artery without angina pectoris: Secondary | ICD-10-CM

## 2020-11-11 DIAGNOSIS — I2582 Chronic total occlusion of coronary artery: Secondary | ICD-10-CM | POA: Diagnosis not present

## 2020-11-11 DIAGNOSIS — E119 Type 2 diabetes mellitus without complications: Secondary | ICD-10-CM | POA: Diagnosis present

## 2020-11-11 DIAGNOSIS — Z888 Allergy status to other drugs, medicaments and biological substances status: Secondary | ICD-10-CM

## 2020-11-11 DIAGNOSIS — E785 Hyperlipidemia, unspecified: Secondary | ICD-10-CM | POA: Diagnosis present

## 2020-11-11 DIAGNOSIS — N1832 Chronic kidney disease, stage 3b: Secondary | ICD-10-CM | POA: Diagnosis not present

## 2020-11-11 DIAGNOSIS — N179 Acute kidney failure, unspecified: Secondary | ICD-10-CM | POA: Diagnosis not present

## 2020-11-11 DIAGNOSIS — I252 Old myocardial infarction: Secondary | ICD-10-CM

## 2020-11-11 DIAGNOSIS — I130 Hypertensive heart and chronic kidney disease with heart failure and stage 1 through stage 4 chronic kidney disease, or unspecified chronic kidney disease: Secondary | ICD-10-CM | POA: Diagnosis not present

## 2020-11-11 DIAGNOSIS — E1122 Type 2 diabetes mellitus with diabetic chronic kidney disease: Secondary | ICD-10-CM | POA: Diagnosis present

## 2020-11-11 DIAGNOSIS — I517 Cardiomegaly: Secondary | ICD-10-CM | POA: Diagnosis not present

## 2020-11-11 DIAGNOSIS — Z79899 Other long term (current) drug therapy: Secondary | ICD-10-CM | POA: Diagnosis not present

## 2020-11-11 DIAGNOSIS — E1159 Type 2 diabetes mellitus with other circulatory complications: Secondary | ICD-10-CM | POA: Diagnosis present

## 2020-11-11 DIAGNOSIS — G8929 Other chronic pain: Secondary | ICD-10-CM | POA: Diagnosis present

## 2020-11-11 DIAGNOSIS — Z951 Presence of aortocoronary bypass graft: Secondary | ICD-10-CM

## 2020-11-11 DIAGNOSIS — J449 Chronic obstructive pulmonary disease, unspecified: Secondary | ICD-10-CM | POA: Diagnosis present

## 2020-11-11 DIAGNOSIS — G4733 Obstructive sleep apnea (adult) (pediatric): Secondary | ICD-10-CM | POA: Diagnosis present

## 2020-11-11 DIAGNOSIS — Z7982 Long term (current) use of aspirin: Secondary | ICD-10-CM | POA: Diagnosis not present

## 2020-11-11 DIAGNOSIS — J811 Chronic pulmonary edema: Secondary | ICD-10-CM | POA: Diagnosis not present

## 2020-11-11 DIAGNOSIS — I5033 Acute on chronic diastolic (congestive) heart failure: Secondary | ICD-10-CM | POA: Diagnosis not present

## 2020-11-11 DIAGNOSIS — Z9889 Other specified postprocedural states: Secondary | ICD-10-CM

## 2020-11-11 DIAGNOSIS — E1142 Type 2 diabetes mellitus with diabetic polyneuropathy: Secondary | ICD-10-CM | POA: Diagnosis present

## 2020-11-11 DIAGNOSIS — Z7902 Long term (current) use of antithrombotics/antiplatelets: Secondary | ICD-10-CM

## 2020-11-11 DIAGNOSIS — I214 Non-ST elevation (NSTEMI) myocardial infarction: Secondary | ICD-10-CM

## 2020-11-11 DIAGNOSIS — N1831 Chronic kidney disease, stage 3a: Secondary | ICD-10-CM | POA: Diagnosis present

## 2020-11-11 DIAGNOSIS — J90 Pleural effusion, not elsewhere classified: Secondary | ICD-10-CM | POA: Diagnosis not present

## 2020-11-11 DIAGNOSIS — Z955 Presence of coronary angioplasty implant and graft: Secondary | ICD-10-CM

## 2020-11-11 HISTORY — DX: Presence of aortocoronary bypass graft: Z95.1

## 2020-11-11 HISTORY — PX: IABP INSERTION: CATH118242

## 2020-11-11 HISTORY — PX: RIGHT/LEFT HEART CATH AND CORONARY/GRAFT ANGIOGRAPHY: CATH118267

## 2020-11-11 HISTORY — DX: Atherosclerotic heart disease of native coronary artery without angina pectoris: I25.10

## 2020-11-11 LAB — GLUCOSE, CAPILLARY: Glucose-Capillary: 124 mg/dL — ABNORMAL HIGH (ref 70–99)

## 2020-11-11 LAB — BASIC METABOLIC PANEL
Anion gap: 9 (ref 5–15)
BUN: 33 mg/dL — ABNORMAL HIGH (ref 8–23)
CO2: 31 mmol/L (ref 22–32)
Calcium: 8.6 mg/dL — ABNORMAL LOW (ref 8.9–10.3)
Chloride: 98 mmol/L (ref 98–111)
Creatinine, Ser: 1.83 mg/dL — ABNORMAL HIGH (ref 0.61–1.24)
GFR, Estimated: 38 mL/min — ABNORMAL LOW (ref >60)
Glucose, Bld: 142 mg/dL — ABNORMAL HIGH (ref 70–99)
Potassium: 4 mmol/L (ref 3.5–5.1)
Sodium: 138 mmol/L (ref 135–145)

## 2020-11-11 LAB — CBC
HCT: 44.8 % (ref 39.0–52.0)
Hemoglobin: 14.4 g/dL (ref 13.0–17.0)
MCH: 29.5 pg (ref 26.0–34.0)
MCHC: 32.1 g/dL (ref 30.0–36.0)
MCV: 91.8 fL (ref 80.0–100.0)
Platelets: 171 10*3/uL (ref 150–400)
RBC: 4.88 MIL/uL (ref 4.22–5.81)
RDW: 15 % (ref 11.5–15.5)
WBC: 8.8 10*3/uL (ref 4.0–10.5)
nRBC: 0 % (ref 0.0–0.2)

## 2020-11-11 LAB — HEPARIN LEVEL (UNFRACTIONATED): Heparin Unfractionated: 0.5 IU/mL (ref 0.30–0.70)

## 2020-11-11 SURGERY — RIGHT/LEFT HEART CATH AND CORONARY/GRAFT ANGIOGRAPHY
Anesthesia: Moderate Sedation

## 2020-11-11 MED ORDER — HEPARIN (PORCINE) IN NACL 1000-0.9 UT/500ML-% IV SOLN
INTRAVENOUS | Status: DC | PRN
  Administered 2020-11-11: 1000 mL

## 2020-11-11 MED ORDER — SODIUM CHLORIDE 0.9 % IV SOLN: 250.0000 mL | INTRAVENOUS | Status: DC | PRN

## 2020-11-11 MED ORDER — ONDANSETRON HCL 4 MG/2ML IJ SOLN: 4.0000 mg | Freq: Four times a day (QID) | INTRAMUSCULAR | Status: DC | PRN

## 2020-11-11 MED ORDER — ACETAMINOPHEN 325 MG PO TABS: 650.0000 mg | ORAL_TABLET | ORAL | Status: DC | PRN

## 2020-11-11 MED ORDER — IOHEXOL 350 MG/ML SOLN
INTRAVENOUS | Status: DC | PRN
  Administered 2020-11-11: 57 mL

## 2020-11-11 MED ORDER — SODIUM CHLORIDE 0.9 % IV SOLN: INTRAVENOUS | Status: DC

## 2020-11-11 MED ORDER — HEPARIN SODIUM (PORCINE) 1000 UNIT/ML IJ SOLN
INTRAMUSCULAR | Status: DC | PRN
  Administered 2020-11-11: 5000 [IU] via INTRAVENOUS

## 2020-11-11 MED ORDER — ASPIRIN 81 MG PO CHEW
81.0000 mg | CHEWABLE_TABLET | ORAL | Status: AC
  Administered 2020-11-12: 81 mg via ORAL
  Filled 2020-11-11: qty 1

## 2020-11-11 MED ORDER — CLOPIDOGREL BISULFATE 75 MG PO TABS
75.0000 mg | ORAL_TABLET | Freq: Every day | ORAL | Status: DC
  Administered 2020-11-12 – 2020-11-14 (×3): 75 mg via ORAL
  Filled 2020-11-11 (×3): qty 1

## 2020-11-11 MED ORDER — MIDAZOLAM HCL 2 MG/2ML IJ SOLN
INTRAMUSCULAR | Status: DC | PRN
  Administered 2020-11-11 (×2): 1 mg via INTRAVENOUS

## 2020-11-11 MED ORDER — HEPARIN (PORCINE) 25000 UT/250ML-% IV SOLN
1700.0000 [IU]/h | INTRAVENOUS | Status: DC
Start: 2020-11-11 — End: 2020-11-12
  Administered 2020-11-11 – 2020-11-12 (×2): 1700 [IU]/h via INTRAVENOUS
  Filled 2020-11-11 (×2): qty 250

## 2020-11-11 MED ORDER — VERAPAMIL HCL 2.5 MG/ML IV SOLN
INTRAVENOUS | Status: AC
  Filled 2020-11-11: qty 2

## 2020-11-11 MED ORDER — FENTANYL CITRATE PF 50 MCG/ML IJ SOSY
PREFILLED_SYRINGE | INTRAMUSCULAR | Status: AC
  Filled 2020-11-11: qty 1

## 2020-11-11 MED ORDER — SODIUM CHLORIDE 0.9% FLUSH
3.0000 mL | Freq: Two times a day (BID) | INTRAVENOUS | Status: DC
  Administered 2020-11-14: 3 mL via INTRAVENOUS

## 2020-11-11 MED ORDER — SODIUM CHLORIDE 0.9% FLUSH: 3.0000 mL | INTRAVENOUS | Status: DC | PRN

## 2020-11-11 MED ORDER — MIDAZOLAM HCL 2 MG/2ML IJ SOLN
INTRAMUSCULAR | Status: AC
  Filled 2020-11-11: qty 2

## 2020-11-11 MED ORDER — ESCITALOPRAM OXALATE 10 MG PO TABS
20.0000 mg | ORAL_TABLET | Freq: Every day | ORAL | Status: DC
  Administered 2020-11-12 – 2020-11-14 (×3): 20 mg via ORAL
  Filled 2020-11-11 (×3): qty 2

## 2020-11-11 MED ORDER — FLUOCINONIDE 0.05 % EX SOLN
Freq: Two times a day (BID) | CUTANEOUS | Status: DC
Start: 2020-11-11 — End: 2020-11-11

## 2020-11-11 MED ORDER — HEPARIN SODIUM (PORCINE) 1000 UNIT/ML IJ SOLN
INTRAMUSCULAR | Status: AC
  Filled 2020-11-11: qty 1

## 2020-11-11 MED ORDER — HYDROCODONE-ACETAMINOPHEN 5-325 MG PO TABS
1.0000 | ORAL_TABLET | Freq: Three times a day (TID) | ORAL | Status: DC | PRN
  Administered 2020-11-11 – 2020-11-13 (×3): 1 via ORAL
  Filled 2020-11-11 (×3): qty 1

## 2020-11-11 MED ORDER — NITROGLYCERIN IN D5W 200-5 MCG/ML-% IV SOLN
INTRAVENOUS | Status: AC | PRN
  Administered 2020-11-11: 20 ug/min via INTRAVENOUS

## 2020-11-11 MED ORDER — HEPARIN (PORCINE) IN NACL 1000-0.9 UT/500ML-% IV SOLN
INTRAVENOUS | Status: AC
  Filled 2020-11-11: qty 1000

## 2020-11-11 MED ORDER — ALLOPURINOL 300 MG PO TABS
300.0000 mg | ORAL_TABLET | Freq: Every day | ORAL | Status: DC
Start: 2020-11-12 — End: 2020-11-14
  Administered 2020-11-12 – 2020-11-14 (×3): 300 mg via ORAL
  Filled 2020-11-11 (×3): qty 1

## 2020-11-11 MED ORDER — TRAZODONE HCL 50 MG PO TABS
150.0000 mg | ORAL_TABLET | Freq: Every evening | ORAL | Status: DC | PRN
  Administered 2020-11-11 – 2020-11-13 (×3): 300 mg via ORAL
  Filled 2020-11-11 (×3): qty 6

## 2020-11-11 MED ORDER — VITAMIN D 25 MCG (1000 UNIT) PO TABS
4000.0000 [IU] | ORAL_TABLET | Freq: Every day | ORAL | Status: DC
  Administered 2020-11-12 – 2020-11-14 (×3): 4000 [IU] via ORAL
  Filled 2020-11-11 (×3): qty 4

## 2020-11-11 MED ORDER — NITROGLYCERIN 2 % TD OINT
1.0000 [in_us] | TOPICAL_OINTMENT | Freq: Four times a day (QID) | TRANSDERMAL | Status: DC
  Administered 2020-11-11: 1 [in_us] via TOPICAL
  Filled 2020-11-11: qty 1

## 2020-11-11 MED ORDER — HEPARIN (PORCINE) 25000 UT/250ML-% IV SOLN: 1800.0000 [IU]/h | INTRAVENOUS | Status: DC

## 2020-11-11 MED ORDER — LIDOCAINE HCL 1 % IJ SOLN
INTRAMUSCULAR | Status: AC
  Filled 2020-11-11: qty 20

## 2020-11-11 MED ORDER — NITROGLYCERIN 2 % TD OINT: 1.0000 [in_us] | TOPICAL_OINTMENT | Freq: Four times a day (QID) | TRANSDERMAL | 0 refills | Status: DC

## 2020-11-11 MED ORDER — VERAPAMIL HCL 2.5 MG/ML IV SOLN
INTRAVENOUS | Status: DC | PRN
  Administered 2020-11-11: 2.5 mg via INTRA_ARTERIAL

## 2020-11-11 MED ORDER — OMEGA-3-ACID ETHYL ESTERS 1 G PO CAPS
1.0000 g | ORAL_CAPSULE | Freq: Two times a day (BID) | ORAL | Status: DC
  Administered 2020-11-11 – 2020-11-14 (×5): 1 g via ORAL
  Filled 2020-11-11 (×5): qty 1

## 2020-11-11 MED ORDER — ATORVASTATIN CALCIUM 80 MG PO TABS: 80.0000 mg | ORAL_TABLET | Freq: Every day | ORAL | Status: DC

## 2020-11-11 MED ORDER — SODIUM CHLORIDE 0.9% FLUSH
3.0000 mL | Freq: Two times a day (BID) | INTRAVENOUS | Status: DC
  Administered 2020-11-11 (×2): 3 mL via INTRAVENOUS

## 2020-11-11 MED ORDER — GABAPENTIN 300 MG PO CAPS
600.0000 mg | ORAL_CAPSULE | Freq: Every day | ORAL | Status: DC
Start: 2020-11-11 — End: 2020-11-14
  Administered 2020-11-11 – 2020-11-13 (×3): 600 mg via ORAL
  Filled 2020-11-11 (×3): qty 2

## 2020-11-11 MED ORDER — LIDOCAINE HCL (PF) 1 % IJ SOLN
INTRAMUSCULAR | Status: DC | PRN
  Administered 2020-11-11: 4 mL

## 2020-11-11 MED ORDER — NITROGLYCERIN 0.4 MG SL SUBL: 0.4000 mg | SUBLINGUAL_TABLET | SUBLINGUAL | Status: DC | PRN

## 2020-11-11 MED ORDER — SODIUM CHLORIDE 0.9% FLUSH
3.0000 mL | INTRAVENOUS | Status: DC | PRN
Start: 2020-11-11 — End: 2020-11-12
  Administered 2020-11-11: 3 mL via INTRAVENOUS

## 2020-11-11 MED ORDER — AMLODIPINE BESYLATE 10 MG PO TABS
10.0000 mg | ORAL_TABLET | Freq: Every day | ORAL | Status: DC
Start: 2020-11-12 — End: 2020-11-12

## 2020-11-11 MED ORDER — ASPIRIN EC 81 MG PO TBEC
81.0000 mg | DELAYED_RELEASE_TABLET | Freq: Every day | ORAL | Status: DC
  Administered 2020-11-13 – 2020-11-14 (×2): 81 mg via ORAL
  Filled 2020-11-11 (×2): qty 1

## 2020-11-11 MED ORDER — ATORVASTATIN CALCIUM 80 MG PO TABS
80.0000 mg | ORAL_TABLET | Freq: Every day | ORAL | Status: DC
Start: 2020-11-12 — End: 2020-11-14
  Administered 2020-11-13 – 2020-11-14 (×2): 80 mg via ORAL
  Filled 2020-11-11 (×2): qty 1

## 2020-11-11 MED ORDER — NITROGLYCERIN 2 % TD OINT
1.0000 [in_us] | TOPICAL_OINTMENT | Freq: Four times a day (QID) | TRANSDERMAL | Status: DC
  Administered 2020-11-11 (×2): 1 [in_us] via TOPICAL
  Filled 2020-11-11: qty 30

## 2020-11-11 MED ORDER — SODIUM CHLORIDE 0.9 % IV SOLN
INTRAVENOUS | Status: DC
Start: 2020-11-11 — End: 2020-11-12

## 2020-11-11 MED ORDER — FENTANYL CITRATE (PF) 100 MCG/2ML IJ SOLN
INTRAMUSCULAR | Status: DC | PRN
  Administered 2020-11-11: 25 ug via INTRAVENOUS
  Administered 2020-11-11: 50 ug via INTRAVENOUS

## 2020-11-11 MED ORDER — FENTANYL CITRATE (PF) 100 MCG/2ML IJ SOLN
50.0000 ug | INTRAMUSCULAR | Status: DC | PRN
  Administered 2020-11-11 – 2020-11-13 (×9): 50 ug via INTRAVENOUS
  Filled 2020-11-11 (×9): qty 2

## 2020-11-11 SURGICAL SUPPLY — 24 items
BALLN IABP SENSA PLUS 8F 50CC (BALLOONS) ×2 IMPLANT
BALLOON IABP SENS PLUS 8F 50CC (BALLOONS) IMPLANT
CATH BALLN WEDGE 5F 110CM (CATHETERS) ×1 IMPLANT
CATH INFINITI 5 FR JL3.5 (CATHETERS) ×1 IMPLANT
CATH INFINITI 5 FR MPA2 (CATHETERS) ×1 IMPLANT
CATH INFINITI JR4 5F (CATHETERS) ×1 IMPLANT
DEVICE RAD TR BAND REGULAR (VASCULAR PRODUCTS) ×1 IMPLANT
DRAPE BRACHIAL (DRAPES) ×2 IMPLANT
GLIDESHEATH SLEND SS 6F .021 (SHEATH) ×1 IMPLANT
GUIDEWIRE INQWIRE 1.5J.035X260 (WIRE) IMPLANT
INQWIRE 1.5J .035X260CM (WIRE) ×2 IMPLANT
KIT MICROPUNCTURE NIT STIFF (SHEATH) ×1 IMPLANT
NDL PERC 18GX7CM (NEEDLE) IMPLANT
NEEDLE PERC 18GX7CM (NEEDLE) IMPLANT
PACK CARDIAC CATH (CUSTOM PROCEDURE TRAY) ×2 IMPLANT
PANNUS RETENTION SYSTEM 2 PAD (MISCELLANEOUS) ×1 IMPLANT
PROTECTION STATION PRESSURIZED (MISCELLANEOUS) ×2 IMPLANT
SET ATX SIMPLICITY (MISCELLANEOUS) ×1 IMPLANT
SHEATH AVANTI 6FR X 11CM (SHEATH) ×1 IMPLANT
SHEATH GLIDE SLENDER 4/5FR (SHEATH) ×1 IMPLANT
STATION PROTECTION PRESSURIZED (MISCELLANEOUS) IMPLANT
SUT SILK 0 FSL (SUTURE) ×1 IMPLANT
TUBING ART PRESS 72  MALE/FEM (TUBING) ×1
TUBING ART PRESS 72 MALE/FEM (TUBING) IMPLANT

## 2020-11-11 NOTE — H&P (Signed)
Cardiology Admission History and Physical:   Patient ID: Tony [facility] MRN: [id number]; DOB: 08-07-1945   Admission date: 11/11/2020  PCP:  Virginia Crews, MD   [facility] Cardiologist:  Kate Sable, MD   {   Chief Complaint:  Chest pain  Patient Profile:   Tony [facility] is a 75 y.o. male with CAD status post four-vessel CABG in 2007 with NSTEMI in 2018 status PCI x2, DM2, CKD stage III, HTN, HLD with statin intolerance, possible COPD with prior tobacco use quitting greater than 20 years ago, morbid obesity, and OSA not on CPAP who is being seen 11/11/2020 for the evaluation of NSTEMI.  History of Present Illness:   Tony [facility] was previously followed by [facility].  While in [place], he underwent four-vessel CABG in 2007. He was admitted with an NSTEMI in 2018 and had 2 stents placed.  Upon moving to the [place] area in 08/2019, he established with Dr. Garen Lah.  Echo in 05/2020 showed an EF of 60 to 65%, no regional wall motion abnormalities, indeterminate LV diastolic function parameters, low normal RV systolic function, and no significant valvular abnormalities.  Lexiscan MPI showed a large defect of severe severity present in the basal inferior, basal inferior lateral, mid inferior, and mid inferolateral location.  Findings were consistent with prior MI with moderate peri-infarct ischemia.  EF was 45%.  Overall, this was an intermediate risk study.  He was admitted to [facility] on 8/26 with intermittent angina that was occurring with exertion and at rest.  CTA of the chest in the ED was negative for PE with right lung volume loss with lower lobe fibrothorax noted.  He ruled in for an NSTEMI with troponin trending to 1117.  He was placed on a heparin drip.  Echo on 8/27 showed an EF of 50 to 55%, apical hypokinesis, mildly dilated LV internal cavity size, grade 2 diastolic dysfunction, moderately dilated left atrium, and had overall  challenging images.  He was IV diuresed with noted AKI with serum creatinine peaking at 2.03 on 8/29.  In this setting, IV Lasix was discontinued and he was gently hydrated with normal saline.  With this, renal function improved.  He continued to note intermittent angina that was nitro responsive.  He subsequently underwent LHC on 8/30 which demonstrated critical native coronary and bypass graft disease requiring IABP placement and recommendation for the patient to be transferred to [facility] for high risk PCI, scheduled for 8/31.   Past Medical History:  Diagnosis Date   Cancer ([place])    Chronic kidney disease    COPD (chronic obstructive pulmonary disease) ([place])    Depression    Diabetes mellitus without complication ([place])    H/O heart artery stent    Heart problem     Past Surgical History:  Procedure Laterality Date   ARTERIAL BYPASS SURGRY     COLONOSCOPY WITH PROPOFOL N/A 09/23/2020   Procedure: COLONOSCOPY WITH PROPOFOL;  Surgeon: Lucilla Lame, MD;  Location: [facility] ENDOSCOPY;  Service: Endoscopy;  Laterality: N/A;   EYE SURGERY     IR STENT PLACEMENT ANTE CAROTID INC ANGIO     KNEE SURGERY       Medications Prior to Admission: Prior to Admission medications   Medication Sig Start Date End Date Taking? Authorizing Provider  albuterol (VENTOLIN HFA) 108 (90 Base) MCG/ACT inhaler Inhale 2 puffs into the lungs every 6 (six) hours as needed for wheezing or shortness of breath. 04/18/20   Flinchum, Kelby Aline,  FNP  allopurinol (ZYLOPRIM) 300 MG tablet Take 1 tablet (300 mg total) by mouth daily. 07/21/20   Virginia Crews, MD  amLODipine (NORVASC) 10 MG tablet Take 1 tablet (10 mg total) by mouth daily. 07/21/20   Virginia Crews, MD  aspirin EC 81 MG tablet Take 81 mg by mouth daily. Swallow whole.    [provider]  blood glucose meter kit and supplies Dispense based on patient and insurance preference. Use up to four times daily as directed. (FOR ICD-10 E10.9, E11.9).  check blood glucose fasting and before meals four times daily. 04/18/20   Flinchum, Kelby Aline, FNP  Cholecalciferol (VITAMIN D3 PO) Take 4,000 Units by mouth.    [provider]  clopidogrel (PLAVIX) 75 MG tablet Take 1 tablet (75 mg total) by mouth daily. 09/29/20   Virginia Crews, MD  Coenzyme Q10 (CO Q-10) 400 MG CAPS Take by mouth daily at 6 (six) AM.    [provider]  desoximetasone (TOPICORT) 0.25 % cream Apply 1 application topically 2 (two) times daily. 10/24/20   [provider]  escitalopram (LEXAPRO) 20 MG tablet Take 1 tablet (20 mg total) by mouth daily. 09/29/20   Bacigalupo, Dionne Bucy, MD  Evolocumab (REPATHA SURECLICK) 355 MG/ML SOAJ Inject 1 pen into the skin every 14 (fourteen) days. 05/05/20   Kate Sable, MD  fluticasone-salmeterol (ADVAIR HFA) 230-21 MCG/ACT inhaler Inhale 1-2 puffs into the lungs 2 (two) times daily. Rinse mouth after each use. 04/18/20   Flinchum, Kelby Aline, FNP  gabapentin (NEURONTIN) 300 MG capsule Take 2 capsules (600 mg total) by mouth at bedtime. 07/21/20   Bacigalupo, Dionne Bucy, MD  HYDROcodone-acetaminophen (NORCO) 5-325 MG tablet Take 1 tablet by mouth every 8 (eight) hours as needed for moderate pain. 10/21/20   Virginia Crews, MD  lisinopril (ZESTRIL) 5 MG tablet Take 1 tablet (5 mg total) by mouth daily. 07/22/20   Virginia Crews, MD  lovastatin (MEVACOR) 40 MG tablet Take 1 tablet (40 mg total) by mouth daily. 07/21/20   Bacigalupo, Dionne Bucy, MD  metoprolol tartrate (LOPRESSOR) 25 MG tablet Take 25 mg by mouth daily at 6 (six) AM. 04/08/20   [provider]  Omega-3 Fatty Acids (FISH OIL) 1200 MG CAPS Take by mouth daily at 6 (six) AM.    [provider]  traZODone (DESYREL) 150 MG tablet Take 1-2 tablets (150-300 mg total) by mouth at bedtime as needed for sleep. 07/21/20   Virginia Crews, MD     Allergies:    Allergies  Allergen Reactions   Morphine    Simvastatin    Stadol  [Butorphanol] Itching    Social History:   Social History   Socioeconomic History   Marital status: Married    Spouse name: Not on file   Number of children: 2   Years of education: Not on file   Highest education level: High school graduate  Occupational History   Occupation: retired  Tobacco Use   Smoking status: Former    Types: Cigarettes    Quit date: 10/23/2003    Years since quitting: 17.0   Smokeless tobacco: Never  Vaping Use   Vaping Use: Never used  Substance and Sexual Activity   Alcohol use: Never   Drug use: Never   Sexual activity: Not on file  Other Topics Concern   Not on file  Social History Narrative   Not on file   Social Determinants of Health   Financial  Resource Strain: Low Risk    Difficulty of Paying Living Expenses: Not hard at all  Food Insecurity: No Food Insecurity   Worried About Charity fundraiser in the Last Year: Never true   Ran Out of Food in the Last Year: Never true  Transportation Needs: No Transportation Needs   Lack of Transportation (Medical): No   Lack of Transportation (Non-Medical): No  Physical Activity: Inactive   Days of Exercise per Week: 0 days   Minutes of Exercise per Session: 0 min  Stress: No Stress Concern Present   Feeling of Stress : Not at all  Social Connections: Moderately Isolated   Frequency of Communication with Friends and Family: More than three times a week   Frequency of Social Gatherings with Friends and Family: Three times a week   Attends Religious Services: Never   Active Member of Clubs or Organizations: No   Attends Archivist Meetings: Never   Marital Status: Married  Human resources officer Violence: Not At Risk   Fear of Current or Ex-Partner: No   Emotionally Abused: No   Physically Abused: No   Sexually Abused: No    Family History:   The patient's family history includes Heart Problems in his father and mother.    ROS:  Please see the history of present illness.  All other  ROS reviewed and negative.     Physical Exam/Data:   Vitals:    11/11/20 0046 11/11/20 0104 11/11/20 0425 11/11/20 0847  BP: 132/65   110/61 132/63  Pulse: 76   67 67  Resp: _0 Temp: 98.8 F (37.1 C)   98.2 F (36.8 C)    TempSrc: Oral   Oral    SpO2: 96%   92% (!) 88%  Weight:   119.9 kg      Height:              Intake/Output Summary (Last 24 hours) at 11/11/2020 0956 Last data filed at 11/11/2020 0350    Gross per 24 hour  Intake 2082.55 ml  Output 1550 ml  Net 532.55 ml           Filed Weights    11/09/20 0503 11/10/20 0511 11/11/20 0104  Weight: 120.3 kg 120.3 kg 119.9 kg      General:  Well nourished, well developed, in no acute distress HEENT: Normal Lymph: No adenopathy Neck: No JVD Endocrine:  No thryomegaly Vascular: No carotid bruits; FA pulses 2+ bilaterally without bruits  Cardiac:  Normal S1, S2; RRR; no murmur  Lungs:  Clear to auscultation bilaterally, no wheezing, rhonchi or rales  Abd: Soft, nontender, no hepatomegaly  Ext: Trace bilateral edema Musculoskeletal:  No deformities, BUE and BLE strength normal and equal Skin: Warm and dry  Neuro:  CNs 2-12 intact, no focal abnormalities noted Psych:  Normal affect    EKG:  The ECG that was done 11/09/2018 was personally reviewed and demonstrates NSR, 72 bpm, occasional PVCs, possible prior anterior infarct, lateral ST-T change  Relevant CV Studies:  Lexiscan MPI 05/21/2020 T wave inversion was noted during stress in the aVL leads. There was no ST segment deviation noted during stress. Defect 1: There is a large defect of severe severity present in the basal inferior, basal inferolateral, mid inferior and mid inferolateral location. Findings consistent with prior myocardial infarction with moderate peri-infarct ischemia. This is an intermediate risk study. Nuclear stress EF: 45%. CT attenuation images show evidence of moderate aortic and  coronary calcifications. __________  2D echo  05/22/2020: 1. Left ventricular ejection fraction, by estimation, is 60 to 65%. The  left ventricle has normal function. The left ventricle has no regional  wall motion abnormalities. Left ventricular diastolic parameters are  indeterminate.   2. Right ventricular systolic function is low normal. The right  ventricular size is not well visualized.   3. The mitral valve is normal in structure. No evidence of mitral valve  regurgitation.   4. The aortic valve was not well visualized. Aortic valve regurgitation  is trivial. __________  2D echo 11/08/2020:  1. Left ventricular ejection fraction, by estimation, is 50 to 55%. The  left ventricle has low normal function. The left ventricle demonstrates  regional wall motion abnormalities (apical hypokinesis). The left  ventricular internal cavity size was mildly   dilated. Left ventricular diastolic parameters are consistent with Grade  II diastolic dysfunction (pseudonormalization).   2. Right ventricular systolic function was not well visualized. The right  ventricular size is not well visualized.   3. Left atrial size was moderately dilated.   4. Challenging images __________  Tony [facility] 11/11/2020: Conclusions: Severe native coronary artery disease including 90% distal LMCA stenosis, 100% mid LAD occlusion, 100% ostial LCx occlusion, and severe diffuse RCA disease. Widely patent LIMA-D1. Patent sequential SVG-D2-LAD with 99% mid graft stenosis as well as 90% stenosis at the LIMA-D2 anastomosis. Chronically occluded, previously stented SVG-OM. Mildly elevated left and right heart filling pressures. Moderate pulmonary hypertension. Normal-supranormal Fick cardiac output/index. Successful placement of 50 mL intra-aortic balloon pump via the right common femoral artery.   Recommendations: Images reviewed with Dr. Fletcher Anon.  We agree that patient is high risk for revascularization given his severe native and graft disease.  His distal targets are  small and diffusely diseased, making them suboptimal for redo-CABG.  We will transfer to [facility] for possible high risk PCI to SVG-D2-LAD tomorrow. Titrate IV nitroglycerin for relief of chest pain. Aggressive secondary prevention.  Laboratory Data:  High Sensitivity Troponin:   Recent Labs  Lab 11/08/20 0418 11/08/20 0616 11/10/20 0917  TROPONINIHS 376* 455* 1,117*      Chemistry Recent Labs  Lab 11/10/20 0608 11/11/20 0104  NA 137 138  K 4.4 4.0  CL 99 98  CO2 32 31  GLUCOSE 122* 142*  BUN 34* 33*  CREATININE 2.03* 1.83*  CALCIUM 8.9 8.6*  GFRNONAA 34* 38*  ANIONGAP 6 9    Recent Labs  Lab 11/08/20 0418  PROT 7.2  ALBUMIN 3.7  AST 16  ALT 11  ALKPHOS 50  BILITOT 0.7   Hematology Recent Labs  Lab 11/10/20 0608 11/11/20 0103  WBC 9.2 8.8  RBC 4.67 4.88  HGB 14.3 14.4  HCT 44.1 44.8  MCV 94.4 91.8  MCH 30.6 29.5  MCHC 32.4 32.1  RDW 15.4 15.0  PLT 173 171   BNP Recent Labs  Lab 11/08/20 0558  BNP 138.7*    DDimer No results for input(s): DDIMER in the last 168 hours.   Radiology/Studies:  CARDIAC CATHETERIZATION  Result Date: 11/11/2020 Conclusions: Severe native coronary artery disease including 90% distal LMCA stenosis, 100% mid LAD occlusion, 100% ostial LCx occlusion, and severe diffuse RCA disease. Widely patent LIMA-D1. Patent sequential SVG-D2-LAD with 99% mid graft stenosis as well as 90% stenosis at the LIMA-D2 anastomosis. Chronically occluded, previously stented SVG-OM. Mildly elevated left and right heart filling pressures. Moderate pulmonary hypertension. Normal-supranormal Fick cardiac output/index. Successful placement of 50 mL intra-aortic balloon pump via the  right common femoral artery. Recommendations: Images reviewed with Dr. Fletcher Anon.  We agree that patient is high risk for revascularization given his severe native and graft disease.  His distal targets are small and diffusely diseased, making them suboptimal for redo-CABG.  We  will transfer to [facility] for possible high risk PCI to SVG-D2-LAD tomorrow. Titrate IV nitroglycerin for relief of chest pain. Aggressive secondary prevention. Tony Bush, MD [facility]    Assessment and Plan:   CAD status post CABG status post PCI with NSTEMI: LHC on 8/30 showed severe native coronary and bypass graft disease requiring IABP placement.  Recommendation is to transfer the patient to [facility] for high risk PCI on 8/31.  Heparin drip will be resumed at 1900 this evening.  Continue ASA, clopidogrel, Nitropaste, amlodipine, and atorvastatin.  N.p.o. at midnight.  Risks and benefits of cardiac catheterization have been discussed with the patient including risks of bleeding, bruising, infection, kidney damage, stroke, heart attack, injury to a limb, urgent need for bypass, and death. The patient understands these risks and is willing to proceed with the procedure. All questions have been answered and concerns listened to.   HFpEF: Volume status is difficult to assess on physical exam secondary to body habitus.  IV Lasix has been on hold since the morning of 8/29 with AKI.  2:1 AV block: Brief episode noted during the evening, not overnight, of 8/28.  No further episodes since discontinuing beta-blocker.  Acute on CKD stage IIIa: Improving on 8/30 with IV fluids.  Continue to hold IV Lasix.  Monitor renal function post cath.  HTN: Blood pressure has been reasonably controlled.  He remains on amlodipine and Imdur.  Lisinopril held for acute on CKD.  Beta-blocker held for brief episode of 2-1 AV block noted on telemetry.  HLD: LDL 47.  Historically he has been intolerant to statins, though seems to have tolerated Lipitor in place of PTA lovastatin during admission at [facility].  At discharge, continue PCSK9i.  DM2: A1c 5.9 earlier this month.  SSI.  COPD: No active exacerbation.  Obesity with OSA: Weight loss is encouraged to heart of the diet and regular exercise.  Historically,  he has refused CPAP.   Risk Assessment/Risk Scores:   TIMI Risk Score for Unstable Angina or Non-ST Elevation MI:   The patient's TIMI risk score is 7, which indicates a 41% risk of all cause mortality, new or recurrent myocardial infarction or need for urgent revascularization in the next 14 days.{   New York Heart Association (NYHA) Functional Class NYHA Class III     Severity of Illness: The appropriate patient status for this patient is INPATIENT. Inpatient status is judged to be reasonable and necessary in order to provide the required intensity of service to ensure the patient's safety. The patient's presenting symptoms, physical exam findings, and initial radiographic and laboratory data in the context of their chronic comorbidities is felt to place them at high risk for further clinical deterioration. Furthermore, it is not anticipated that the patient will be medically stable for discharge from the [facility] within 2 midnights of admission. The following factors support the patient status of inpatient.   " The patient's presenting symptoms include as above. " The worrisome physical exam findings include as above. " The initial radiographic and laboratory data are worrisome because of as above. " The chronic co-morbidities include as above.   * I certify that at the point of admission it is my clinical judgment that the patient will  require inpatient [facility] care spanning beyond 2 midnights from the point of admission due to high intensity of service, high risk for further deterioration and high frequency of surveillance required.*   For questions or updates, please contact [place] Please consult [email or web address] for contact info under     Signed, Christell Faith, PA-C  11/11/2020 4:56 PM

## 2020-11-11 NOTE — Interval H&P Note (Signed)
History and Physical Interval Note:  11/11/2020 1:59 PM  Tony Williams  has presented today for surgery, with the diagnosis of non ST segment myocardial infarction.  The various methods of treatment have been discussed with the patient and family. After consideration of risks, benefits and other options for treatment, the patient has consented to  Procedure(s): RIGHT/LEFT HEART CATH AND CORONARY/GRAFT ANGIOGRAPHY (N/A) as a surgical intervention.  The patient's history has been reviewed, patient examined, no change in status, stable for surgery.  I have reviewed the patient's chart and labs.  Questions were answered to the patient's satisfaction.    Cath Lab Visit (complete for each Cath Lab visit)  Clinical Evaluation Leading to the Procedure:   ACS: Yes.    Non-ACS:  N/A  Liz Pinho

## 2020-11-11 NOTE — Plan of Care (Signed)
  Problem: Education: Goal: Knowledge of General Education information will improve Description: Including pain rating scale, medication(s)/side effects and non-pharmacologic comfort measures Outcome: Progressing   Problem: Health Behavior/Discharge Planning: Goal: Ability to manage health-related needs will improve Outcome: Progressing   Problem: Clinical Measurements: Goal: Ability to maintain clinical measurements within normal limits will improve Outcome: Progressing Goal: Will remain free from infection Outcome: Progressing Goal: Diagnostic test results will improve Outcome: Progressing Goal: Respiratory complications will improve Outcome: Progressing Goal: Cardiovascular complication will be avoided Outcome: Progressing   Problem: Activity: Goal: Risk for activity intolerance will decrease Outcome: Progressing   Problem: Nutrition: Goal: Adequate nutrition will be maintained Outcome: Progressing   Problem: Coping: Goal: Level of anxiety will decrease Outcome: Progressing   Problem: Elimination: Goal: Will not experience complications related to bowel motility Outcome: Progressing Goal: Will not experience complications related to urinary retention Outcome: Progressing   Problem: Pain Managment: Goal: General experience of comfort will improve Outcome: Progressing   Problem: Safety: Goal: Ability to remain free from injury will improve Outcome: Progressing   Problem: Skin Integrity: Goal: Risk for impaired skin integrity will decrease Outcome: Progressing   Problem: Education: Goal: Understanding of CV disease, CV risk reduction, and recovery process will improve Outcome: Progressing Goal: Individualized Educational Video(s) Outcome: Progressing   Problem: Activity: Goal: Ability to return to baseline activity level will improve Outcome: Progressing   Problem: Cardiovascular: Goal: Ability to achieve and maintain adequate cardiovascular perfusion  will improve Outcome: Not Progressing Goal: Vascular access site(s) Level 0-1 will be maintained Outcome: Not Progressing   Problem: Health Behavior/Discharge Planning: Goal: Ability to safely manage health-related needs after discharge will improve Outcome: Progressing

## 2020-11-11 NOTE — Progress Notes (Signed)
Orthopedic Tech Progress Note Patient Details:  Tony [facility] Jan 01, 1946 [id number]  Ortho Devices Type of Ortho Device: Knee Immobilizer Ortho Device/Splint Location: rle Ortho Device/Splint Interventions: Ordered, Application, Adjustment   Post Interventions Patient Tolerated: Well Instructions Provided: Care of device, Adjustment of device  Karolee Stamps 11/11/2020, 10:02 PM

## 2020-11-11 NOTE — H&P (View-Only) (Signed)
Progress Note  Patient Name: Tony Williams Date of Encounter: 11/11/2020  Primary Cardiologist: Agbor-Etang  Subjective   Continues to note intermittent chest with less left arm discomfort that improved with SL NTG.With IV fluids and holding of IV Lasix, renal function is improving today. Vitals stable. He is NPO for [facility] today.   Inpatient Medications    Scheduled Meds:  allopurinol  300 mg Oral Daily   amLODipine  10 mg Oral Daily   aspirin EC  81 mg Oral Daily   atorvastatin  80 mg Oral Daily   cholecalciferol  4,000 Units Oral Daily   clopidogrel  75 mg Oral Daily   escitalopram  20 mg Oral Daily   fluocinonide cream   Topical BID   gabapentin  600 mg Oral QHS   nitroGLYCERIN  1 inch Topical Q6H   omega-3 acid ethyl esters  1 g Oral BID   sodium chloride flush  3 mL Intravenous Q12H   Continuous Infusions:  sodium chloride 75 mL/hr at 11/11/20 0340   sodium chloride     sodium chloride     heparin 1,800 Units/hr (11/10/20 2302)   PRN Meds: sodium chloride, acetaminophen **OR** acetaminophen, albuterol, fentaNYL (SUBLIMAZE) injection, HYDROcodone-acetaminophen, magnesium hydroxide, nitroGLYCERIN, ondansetron **OR** ondansetron (ZOFRAN) IV, sodium chloride flush, traZODone   Vital Signs    Vitals:   11/11/20 0046 11/11/20 0104 11/11/20 0425 11/11/20 0847  BP: 132/65  110/61 132/63  Pulse: 76  67 67  Resp: '16  18 18  '$ Temp: 98.8 F (37.1 C)  98.2 F (36.8 C)   TempSrc: Oral  Oral   SpO2: 96%  92% (!) 88%  Weight:  119.9 kg    Height:        Intake/Output Summary (Last 24 hours) at 11/11/2020 0956 Last data filed at 11/11/2020 0350 Gross per 24 hour  Intake 2082.55 ml  Output 1550 ml  Net 532.55 ml    Filed Weights   11/09/20 0503 11/10/20 0511 11/11/20 0104  Weight: 120.3 kg 120.3 kg 119.9 kg    Telemetry    SR - Personally Reviewed  ECG    No new tracings - Personally Reviewed  Physical Exam   GEN: No acute distress.   Neck: No  JVD. Cardiac: RRR, no murmurs, rubs, or gallops.  Respiratory: Clear to auscultation bilaterally.  GI: Soft, nontender, non-distended.   MS: No edema; No deformity. Neuro:  Alert and oriented x 3; Nonfocal.  Psych: Normal affect.  Labs    Chemistry Recent Labs  Lab 11/08/20 0418 11/09/20 0436 11/10/20 0608 11/11/20 0104  NA 139 139 137 138  K 4.7 4.3 4.4 4.0  CL 103 100 99 98  CO2 32 29 32 31  GLUCOSE 176* 109* 122* 142*  BUN 27* 22 34* 33*  CREATININE 1.73* 1.63* 2.03* 1.83*  CALCIUM 9.3 9.0 8.9 8.6*  PROT 7.2  --   --   --   ALBUMIN 3.7  --   --   --   AST 16  --   --   --   ALT 11  --   --   --   ALKPHOS 50  --   --   --   BILITOT 0.7  --   --   --   GFRNONAA 41* 44* 34* 38*  ANIONGAP 4* '10 6 9      '$ Hematology Recent Labs  Lab 11/09/20 0436 11/10/20 0608 11/11/20 0103  WBC 9.3 9.2 8.8  RBC 4.81 4.67  4.88  HGB 14.8 14.3 14.4  HCT 45.4 44.1 44.8  MCV 94.4 94.4 91.8  MCH 30.8 30.6 29.5  MCHC 32.6 32.4 32.1  RDW 15.0 15.4 15.0  PLT 187 173 171     Cardiac EnzymesNo results for input(s): TROPONINI in the last 168 hours. No results for input(s): TROPIPOC in the last 168 hours.   BNP Recent Labs  Lab 11/08/20 0558  BNP 138.7*      DDimer No results for input(s): DDIMER in the last 168 hours.   Radiology    CTA chest 11/08/2020: IMPRESSION: 1. Negative for acute pulmonary embolus. 2. Right lung volume loss with lower lobe fibrothorax, or less likely small pleural empyema. Associated right lung atelectasis and architectural distortion. Mild atelectasis and pulmonary scarring elsewhere. 3. Cardiomegaly.  Aortic Atherosclerosis (ICD10-I70.0). 4. Chronic T12 compression fracture. __________  CXR 11/08/2020: IMPRESSION: Mild congestive heart failure.  Cardiac Studies   2D echo 11/08/2020:  1. Left ventricular ejection fraction, by estimation, is 50 to 55%. The  left ventricle has low normal function. The left ventricle demonstrates  regional  wall motion abnormalities (apical hypokinesis). The left  ventricular internal cavity size was mildly   dilated. Left ventricular diastolic parameters are consistent with Grade  II diastolic dysfunction (pseudonormalization).   2. Right ventricular systolic function was not well visualized. The right  ventricular size is not well visualized.   3. Left atrial size was moderately dilated.   4. Challenging images  __________  2D echo 05/22/2020: 1. Left ventricular ejection fraction, by estimation, is 60 to 65%. The  left ventricle has normal function. The left ventricle has no regional  wall motion abnormalities. Left ventricular diastolic parameters are  indeterminate.   2. Right ventricular systolic function is low normal. The right  ventricular size is not well visualized.   3. The mitral valve is normal in structure. No evidence of mitral valve  regurgitation.   4. The aortic valve was not well visualized. Aortic valve regurgitation  is trivial. __________  Carlton Adam MPI 05/21/2020: T wave inversion was noted during stress in the aVL leads. There was no ST segment deviation noted during stress. Defect 1: There is a large defect of severe severity present in the basal inferior, basal inferolateral, mid inferior and mid inferolateral location. Findings consistent with prior myocardial infarction with moderate peri-infarct ischemia. This is an intermediate risk study. Nuclear stress EF: 45%. CT attenuation images show evidence of moderate aortic and coronary calcifications.    This result has not been signed. Information might be incomplete.    Patient Profile     75 y.o. male with history of CAD s/p 4-vessel CABG in 2007 with MI in 2018 s/p PCI x 2, CKD stage III, DM2, HTN, HLD, possible COPD with prior tobacco use, obesity, and OSA who we are seeing for NSTEMI.   Assessment & Plan    1. CAD s/p CABG s/p PCI with NSTEMI: -Continues to note intermittent angina -High sensitivity  troponin has trended to 455, cycle to peak -ASA -Heparin gtt  -Tolerating re-challenge of Lipitor so far (historically intolerant to statins), resume PCSK9i at discharge -Hold Imdur with initiation of nitropaste -Lopressor on hold with prior brief episode of 2:1 AV block and 1st degree AV block on 8/28, none since holding of beta blocker  -Echo this admission with preserved LVSF and normal wall motion -NPO -R/LHC today -Gentle IV fluids  -Risks and benefits of cardiac catheterization have been discussed with the patient including risks of  bleeding, bruising, infection, kidney damage, stroke, heart attack, urgent need for bypass surgery, injury to a limb, and death. The patient understands these risks and is willing to proceed with the procedure. All questions have been answered and concerns listened to  2. HFpEF: -Continue to hold IV Lasix as below given AKI -CHF education  -Strict I/O -Daily standing scale weights   3. Acute on CKD stage III: -Improving with IV fluids -Continue to hold IV Lasix   4. HTN: -Blood pressure reasonably controlled  -Amlodipine, and Imdur -Lisinopril held with acute on CKD  5. HLD: -LDL 47 -Historically intolerant to statins -Started on Lipitor this admission in place of PTA lovastatin  -At discharge, continue PCSK9i  6. DM2: -A1c 5.9 earlier this month -SSI per IM  7. Obesity with OSA: -Weight loss recommended through heart healthy diet and exercise  -CPAP  8. 2:1 AV block: -Very brief episode noted on telemetry overnight on 8/28 -No further episodes since holding beta blocker    For questions or updates, please contact [place] Please consult [email or web address] for contact info under Cardiology/STEMI.    Signed, Christell Faith, PA-C [place] Pager: [phone] 11/11/2020, 9:56 AM

## 2020-11-11 NOTE — Progress Notes (Signed)
Pt currently scheduled for heart cath early afternoon. Pt with worsening more frequent chest pain. 0.'4mg'$  sublingual nitro administered. Pt stated that it did not seem to help. Administered PRN 38mg fentanyl IV and Dr End and Dr ZRoosevelt Locksnotified. Dr zRoosevelt Locksordered nitro ointment patch. Patch applied. Will continue to monitor.

## 2020-11-11 NOTE — Progress Notes (Signed)
ANTICOAGULATION CONSULT NOTE - Initial Consult  Pharmacy Consult for heparin  Indication: ACS with IABP  Allergies  Allergen Reactions   Morphine    Simvastatin    Stadol [Butorphanol] Itching    Patient Measurements: Heparin dosing weight= 98kg   Vital Signs: Temp: 97.7 F (36.5 C) (08/30 1322) Temp Source: Oral (08/30 1322) BP: 118/65 (08/30 1617) Pulse Rate: 71 (08/30 1617)  Labs: Recent Labs    11/09/20 0436 11/10/20 7829 11/10/20 0917 11/10/20 1623 11/11/20 0103 11/11/20 0104  HGB 14.8 14.3  --   --  14.4  --   HCT 45.4 44.1  --   --  44.8  --   PLT 187 173  --   --  171  --   HEPARINUNFRC 0.45 0.71*  --  0.64 0.50  --   CREATININE 1.63* 2.03*  --   --   --  1.83*  TROPONINIHS  --   --  1,117*  --   --   --     Estimated Creatinine Clearance: 45.3 mL/min (A) (by C-G formula based on SCr of 1.83 mg/dL (H)).   Medical History: Past Medical History:  Diagnosis Date   Cancer ([place])    Chronic kidney disease    COPD (chronic obstructive pulmonary disease) ([place])    Depression    Diabetes mellitus without complication ([place])    H/O heart artery stent    Heart problem     Medications:  Medications Prior to Admission  Medication Sig Dispense Refill Last Dose   albuterol (VENTOLIN HFA) 108 (90 Base) MCG/ACT inhaler Inhale 2 puffs into the lungs every 6 (six) hours as needed for wheezing or shortness of breath. 8 g 2    allopurinol (ZYLOPRIM) 300 MG tablet Take 1 tablet (300 mg total) by mouth daily. 90 tablet 3    amLODipine (NORVASC) 10 MG tablet Take 1 tablet (10 mg total) by mouth daily. 90 tablet 1    aspirin EC 81 MG tablet Take 81 mg by mouth daily. Swallow whole.      [START ON 11/12/2020] atorvastatin (LIPITOR) 80 MG tablet Take 1 tablet (80 mg total) by mouth daily.      blood glucose meter kit and supplies Dispense based on patient and insurance preference. Use up to four times daily as directed. (FOR ICD-10 E10.9, E11.9). check blood glucose fasting  and before meals four times daily. 1 each 0    Cholecalciferol (VITAMIN D3 PO) Take 4,000 Units by mouth.      clopidogrel (PLAVIX) 75 MG tablet Take 1 tablet (75 mg total) by mouth daily. 90 tablet 1    Coenzyme Q10 (CO Q-10) 400 MG CAPS Take by mouth daily at 6 (six) AM.      desoximetasone (TOPICORT) 0.25 % cream Apply 1 application topically 2 (two) times daily.      escitalopram (LEXAPRO) 20 MG tablet Take 1 tablet (20 mg total) by mouth daily. 90 tablet 1    Evolocumab (REPATHA SURECLICK) 562 MG/ML SOAJ Inject 1 pen into the skin every 14 (fourteen) days. 2 mL 11    fluticasone-salmeterol (ADVAIR HFA) 230-21 MCG/ACT inhaler Inhale 1-2 puffs into the lungs 2 (two) times daily. Rinse mouth after each use. 1 each 12    gabapentin (NEURONTIN) 300 MG capsule Take 2 capsules (600 mg total) by mouth at bedtime. 180 capsule 1    heparin 25000 UT/250ML infusion Inject 1,800 Units/hr into the vein continuous.      HYDROcodone-acetaminophen (NORCO) 5-325 MG  tablet Take 1 tablet by mouth every 8 (eight) hours as needed for moderate pain. 90 tablet 0    lisinopril (ZESTRIL) 5 MG tablet Take 1 tablet (5 mg total) by mouth daily. 90 tablet 3    metoprolol tartrate (LOPRESSOR) 25 MG tablet Take 25 mg by mouth daily at 6 (six) AM.      nitroGLYCERIN (NITROGLYN) 2 % ointment Apply 1 inch topically every 6 (six) hours. 30 g 0    Omega-3 Fatty Acids (FISH OIL) 1200 MG CAPS Take by mouth daily at 6 (six) AM.      traZODone (DESYREL) 150 MG tablet Take 1-2 tablets (150-300 mg total) by mouth at bedtime as needed for sleep. 180 tablet 1    Scheduled:   [START ON 11/12/2020] allopurinol  300 mg Oral Daily   [START ON 11/12/2020] amLODipine  10 mg Oral Daily   [START ON 11/12/2020] aspirin EC  81 mg Oral Daily   [START ON 11/12/2020] atorvastatin  80 mg Oral Daily   [START ON 11/12/2020] cholecalciferol  4,000 Units Oral Daily   [START ON 11/12/2020] clopidogrel  75 mg Oral Daily   [START ON 11/12/2020] escitalopram   20 mg Oral Daily   fluocinonide   Topical BID   gabapentin  600 mg Oral QHS   nitroGLYCERIN  1 inch Topical Q6H   omega-3 acid ethyl esters  1 g Oral BID    Assessment: 75 yo male with NSTEMI (history CAD and CABG) and s/p cath with multivessel disease for high risk PCI. An IABP was placed in cath. The previous heparin rate was 1800 units/hr and the last heparin level was 0.5. No anticoagulants noted PTA.   Goal of Therapy:  Heparin level= 0.2-0.5 Monitor platelets by anticoagulation protocol: Yes   Plan:  -Restart heparin at 1700 units/hr -Heparin level in 8 hours and daily wth CBC daily  Hildred Laser, PharmD Clinical Pharmacist **Pharmacist phone directory can now be found on [email or web address] ([email or web address]).  Listed under [place].

## 2020-11-11 NOTE — Progress Notes (Signed)
ANTICOAGULATION CONSULT NOTE   Pharmacy Consult for heparin infusion Indication: ACS/STEMI  Allergies  Allergen Reactions   Morphine    Simvastatin    Stadol [Butorphanol] Itching    Patient Measurements: Height: '5\' 9"'$  (175.3 cm) Weight: 119.9 kg (264 lb 5.3 oz) IBW/kg (Calculated) : 70.7 Heparin Dosing Weight: 122.5 kg  Vital Signs: Temp: 98.8 F (37.1 C) (08/30 0046) Temp Source: Oral (08/30 0046) BP: 132/65 (08/30 0046) Pulse Rate: 76 (08/30 0046)  Labs: Recent Labs    11/08/20 0418 11/08/20 0558 11/08/20 0616 11/08/20 1359 11/09/20 0436 11/10/20 0608 11/10/20 0917 11/10/20 1623 11/11/20 0103  HGB 15.7  --   --   --  14.8 14.3  --   --  14.4  HCT 48.2  --   --   --  45.4 44.1  --   --  44.8  PLT 202  --   --   --  187 173  --   --  171  APTT  --  30  --   --   --   --   --   --   --   LABPROT  --  13.9  --   --   --   --   --   --   --   INR  --  1.1  --   --   --   --   --   --   --   HEPARINUNFRC  --   --   --    < > 0.45 0.71*  --  0.64 0.50  CREATININE 1.73*  --   --   --  1.63* 2.03*  --   --   --   TROPONINIHS 376*  --  455*  --   --   --  1,117*  --   --    < > = values in this interval not displayed.     Estimated Creatinine Clearance: 40.8 mL/min (A) (by C-G formula based on SCr of 2.03 mg/dL (H)).   Medical History: Past Medical History:  Diagnosis Date   Cancer ([place])    Chronic kidney disease    COPD (chronic obstructive pulmonary disease) ([place])    Depression    Diabetes mellitus without complication ([place])    H/O heart artery stent    Heart problem     Assessment: Pt is 75 yo male w/ hx of quad bypass & MI, presenting to ED with CP radiating to L side/arm & back and initial Troponin I = 376.   8/27'@1359'$ : HL 0.28 subtherapeutic 8/27@ 2347 HL 0.47, therapeutic 8/28@ 0436 HL 0.45, therapeutic x 2 8/29'@0608'$  HL 0.71, supratherapeutic 8/29'@1623'$  HL 0.64, therapeutic x1 8/30'@0103'$  HL 0.50, therapeutic X 2   Goal of Therapy:  Heparin  level 0.3-0.7 units/ml Monitor platelets by anticoagulation protocol: Yes   Plan:  8/30:  HL @ 0103 = 0.5, therapeutic X 2  Will continue pt on current rate and recheck HL on 8/31 with AM labs.   [place] Pharmacist 11/11/2020

## 2020-11-11 NOTE — Discharge Summary (Signed)
Physician Discharge Summary  Patient ID: Tony Williams MRN: [id number] DOB/AGE: 1945/04/24 75 y.o.  Admit date: 11/08/2020 Discharge date: 11/11/2020  Admission Diagnoses:  Discharge Diagnoses:  Active Problems:   Chronic obstructive pulmonary disease (HCC)   Controlled type 2 diabetes mellitus with diabetic polyneuropathy, without long-term current use of insulin (HCC)   Morbid obesity (HCC)   NSTEMI (non-ST elevated myocardial infarction) (HCC)   Acute on chronic diastolic CHF (congestive heart failure) [facility])   Discharged Condition: fair  Hospital Course:  Tony Williams is a 75 y.o. Caucasian male with medical history significant for coronary artery disease status post four-vessel CABG, status post PCI and 2 stents, COPD, depression, type 2 diabetes mellitus and stage IIIa chronic kidney disease, who presented to emergency room with acute onset of midsternal chest pain.  EKG had no ST elevation, peak troponin 455.  He has been seen by cardiology, placed on heparin drip.  #1.  Non-STEMI. Coronary artery disease with history of CABG and PCI. Acute on chronic diastolic congestive heart failure Patient had intermittent chest pain in the hospital, had a worsening increased troponin.  He was placed on heparin drip, aspirin, Plavix and nitrate. Coronary angiogram today showed multivessel disease, patient will be [facility] to [facility] for high risk PCI. Patient also received IV Lasix, currently he no longer has any volume overload.  2.  Acute kidney injury on chronic kidney disease stage IIIa. Worsening renal function due to Lasix.  He received fluids over the last 24 hours, renal function is better.    3.  Type 2 diabetes. Continue current regimen   4.  COPD. Stable.   Consults: cardiology  Significant Diagnostic Studies:    Ost Cx to Prox Cx lesion is 100% stenosed.   Dist LM lesion is 90% stenosed.   1st Diag lesion is 100% stenosed.   Mid LAD lesion is  100% stenosed.   Dist Graft lesion before 2nd Diag  is 99% stenosed.   Insertion lesion before 2nd Diag  is 90% stenosed.   Prox Graft lesion between 2nd Diag and Mid LAD  is 90% stenosed.   Origin lesion.   SVG graft was visualized by angiography and is large.   SVG graft was visualized by angiography.      Treatments: heart cath, nitrates, heparin drip  Discharge Exam: Blood pressure 118/65, pulse 71, temperature 97.7 F (36.5 C), temperature source Oral, resp. rate 14, height _0  (1.753 m), weight 119.9 kg, SpO2 91 %. General appearance: alert and cooperative Resp: clear to auscultation bilaterally Cardio: regular rate and rhythm, S1, S2 normal, no murmur, click, rub or gallop GI: soft, non-tender; bowel sounds normal; no masses,  no organomegaly Extremities: extremities normal, atraumatic, no cyanosis or edema  Disposition: Discharge disposition: 02-Transferred to [facility]       Discharge Instructions     Diet - low sodium heart healthy   Complete by: As directed    Increase activity slowly   Complete by: As directed       Allergies as of 11/11/2020       Reactions   Morphine    Simvastatin    Stadol [butorphanol] Itching        Medication List     STOP taking these medications    lovastatin 40 MG tablet Commonly known as: MEVACOR       TAKE these medications    Advair HFA 230-21 MCG/ACT inhaler Generic drug: fluticasone-salmeterol Inhale 1-2 puffs into the lungs 2 (  two) times daily. Rinse mouth after each use.   albuterol 108 (90 Base) MCG/ACT inhaler Commonly known as: VENTOLIN HFA Inhale 2 puffs into the lungs every 6 (six) hours as needed for wheezing or shortness of breath.   allopurinol 300 MG tablet Commonly known as: ZYLOPRIM Take 1 tablet (300 mg total) by mouth daily.   amLODipine 10 MG tablet Commonly known as: NORVASC Take 1 tablet (10 mg total) by mouth daily.   aspirin EC 81 MG tablet Take 81 mg by mouth  daily. Swallow whole.   atorvastatin 80 MG tablet Commonly known as: LIPITOR Take 1 tablet (80 mg total) by mouth daily. Start taking on: November 12, 2020   blood glucose meter kit and supplies Dispense based on patient and insurance preference. Use up to four times daily as directed. (FOR ICD-10 E10.9, E11.9). check blood glucose fasting and before meals four times daily.   clopidogrel 75 MG tablet Commonly known as: PLAVIX Take 1 tablet (75 mg total) by mouth daily.   Co Q-10 400 MG Caps Take by mouth daily at 6 (six) AM.   desoximetasone 0.25 % cream Commonly known as: TOPICORT Apply 1 application topically 2 (two) times daily.   escitalopram 20 MG tablet Commonly known as: LEXAPRO Take 1 tablet (20 mg total) by mouth daily.   Fish Oil 1200 MG Caps Take by mouth daily at 6 (six) AM.   gabapentin 300 MG capsule Commonly known as: NEURONTIN Take 2 capsules (600 mg total) by mouth at bedtime.   heparin 25000 UT/250ML infusion Inject 1,800 Units/hr into the vein continuous.   HYDROcodone-acetaminophen 5-325 MG tablet Commonly known as: Norco Take 1 tablet by mouth every 8 (eight) hours as needed for moderate pain.   lisinopril 5 MG tablet Commonly known as: ZESTRIL Take 1 tablet (5 mg total) by mouth daily.   metoprolol tartrate 25 MG tablet Commonly known as: LOPRESSOR Take 25 mg by mouth daily at 6 (six) AM.   nitroGLYCERIN 2 % ointment Commonly known as: NITROGLYN Apply 1 inch topically every 6 (six) hours.   Repatha SureClick 658 MG/ML Soaj Generic drug: Evolocumab Inject 1 pen into the skin every 14 (fourteen) days.   traZODone 150 MG tablet Commonly known as: DESYREL Take 1-2 tablets (150-300 mg total) by mouth at bedtime as needed for sleep.   VITAMIN D3 PO Take 4,000 Units by mouth.       32 minutes  Signed: Sharen Hones 11/11/2020, 4:35 PM

## 2020-11-11 NOTE — Progress Notes (Signed)
   Pt arrived from [place] with IABP and BP stable , warm and dry. Heart RRR and Lungs clear.  Does complain of back pain near kidneys but has chronic back pain.  He asks for fentanyl. Discussed with Dr. Sallyanne Kuster and pt will need to be flat in bed all night so ordered fentanyl prn.  Pt has taken this before.    Plan for High Risk PCI in AM

## 2020-11-11 NOTE — Progress Notes (Signed)
Progress Note  Patient Name: Tony Williams Date of Encounter: 11/11/2020  Primary Cardiologist: Agbor-Etang  Subjective   Continues to note intermittent chest with less left arm discomfort that improved with SL NTG.With IV fluids and holding of IV Lasix, renal function is improving today. Vitals stable. He is NPO for [facility] today.   Inpatient Medications    Scheduled Meds:  allopurinol  300 mg Oral Daily   amLODipine  10 mg Oral Daily   aspirin EC  81 mg Oral Daily   atorvastatin  80 mg Oral Daily   cholecalciferol  4,000 Units Oral Daily   clopidogrel  75 mg Oral Daily   escitalopram  20 mg Oral Daily   fluocinonide cream   Topical BID   gabapentin  600 mg Oral QHS   nitroGLYCERIN  1 inch Topical Q6H   omega-3 acid ethyl esters  1 g Oral BID   sodium chloride flush  3 mL Intravenous Q12H   Continuous Infusions:  sodium chloride 75 mL/hr at 11/11/20 0340   sodium chloride     sodium chloride     heparin 1,800 Units/hr (11/10/20 2302)   PRN Meds: sodium chloride, acetaminophen **OR** acetaminophen, albuterol, fentaNYL (SUBLIMAZE) injection, HYDROcodone-acetaminophen, magnesium hydroxide, nitroGLYCERIN, ondansetron **OR** ondansetron (ZOFRAN) IV, sodium chloride flush, traZODone   Vital Signs    Vitals:   11/11/20 0046 11/11/20 0104 11/11/20 0425 11/11/20 0847  BP: 132/65  110/61 132/63  Pulse: 76  67 67  Resp: '16  18 18  '$ Temp: 98.8 F (37.1 C)  98.2 F (36.8 C)   TempSrc: Oral  Oral   SpO2: 96%  92% (!) 88%  Weight:  119.9 kg    Height:        Intake/Output Summary (Last 24 hours) at 11/11/2020 0956 Last data filed at 11/11/2020 0350 Gross per 24 hour  Intake 2082.55 ml  Output 1550 ml  Net 532.55 ml    Filed Weights   11/09/20 0503 11/10/20 0511 11/11/20 0104  Weight: 120.3 kg 120.3 kg 119.9 kg    Telemetry    SR - Personally Reviewed  ECG    No new tracings - Personally Reviewed  Physical Exam   GEN: No acute distress.   Neck: No  JVD. Cardiac: RRR, no murmurs, rubs, or gallops.  Respiratory: Clear to auscultation bilaterally.  GI: Soft, nontender, non-distended.   MS: No edema; No deformity. Neuro:  Alert and oriented x 3; Nonfocal.  Psych: Normal affect.  Labs    Chemistry Recent Labs  Lab 11/08/20 0418 11/09/20 0436 11/10/20 0608 11/11/20 0104  NA 139 139 137 138  K 4.7 4.3 4.4 4.0  CL 103 100 99 98  CO2 32 29 32 31  GLUCOSE 176* 109* 122* 142*  BUN 27* 22 34* 33*  CREATININE 1.73* 1.63* 2.03* 1.83*  CALCIUM 9.3 9.0 8.9 8.6*  PROT 7.2  --   --   --   ALBUMIN 3.7  --   --   --   AST 16  --   --   --   ALT 11  --   --   --   ALKPHOS 50  --   --   --   BILITOT 0.7  --   --   --   GFRNONAA 41* 44* 34* 38*  ANIONGAP 4* '10 6 9      '$ Hematology Recent Labs  Lab 11/09/20 0436 11/10/20 0608 11/11/20 0103  WBC 9.3 9.2 8.8  RBC 4.81 4.67  4.88  HGB 14.8 14.3 14.4  HCT 45.4 44.1 44.8  MCV 94.4 94.4 91.8  MCH 30.8 30.6 29.5  MCHC 32.6 32.4 32.1  RDW 15.0 15.4 15.0  PLT 187 173 171     Cardiac EnzymesNo results for input(s): TROPONINI in the last 168 hours. No results for input(s): TROPIPOC in the last 168 hours.   BNP Recent Labs  Lab 11/08/20 0558  BNP 138.7*      DDimer No results for input(s): DDIMER in the last 168 hours.   Radiology    CTA chest 11/08/2020: IMPRESSION: 1. Negative for acute pulmonary embolus. 2. Right lung volume loss with lower lobe fibrothorax, or less likely small pleural empyema. Associated right lung atelectasis and architectural distortion. Mild atelectasis and pulmonary scarring elsewhere. 3. Cardiomegaly.  Aortic Atherosclerosis (ICD10-I70.0). 4. Chronic T12 compression fracture. __________  CXR 11/08/2020: IMPRESSION: Mild congestive heart failure.  Cardiac Studies   2D echo 11/08/2020:  1. Left ventricular ejection fraction, by estimation, is 50 to 55%. The  left ventricle has low normal function. The left ventricle demonstrates  regional  wall motion abnormalities (apical hypokinesis). The left  ventricular internal cavity size was mildly   dilated. Left ventricular diastolic parameters are consistent with Grade  II diastolic dysfunction (pseudonormalization).   2. Right ventricular systolic function was not well visualized. The right  ventricular size is not well visualized.   3. Left atrial size was moderately dilated.   4. Challenging images  __________  2D echo 05/22/2020: 1. Left ventricular ejection fraction, by estimation, is 60 to 65%. The  left ventricle has normal function. The left ventricle has no regional  wall motion abnormalities. Left ventricular diastolic parameters are  indeterminate.   2. Right ventricular systolic function is low normal. The right  ventricular size is not well visualized.   3. The mitral valve is normal in structure. No evidence of mitral valve  regurgitation.   4. The aortic valve was not well visualized. Aortic valve regurgitation  is trivial. __________  Carlton Adam MPI 05/21/2020: T wave inversion was noted during stress in the aVL leads. There was no ST segment deviation noted during stress. Defect 1: There is a large defect of severe severity present in the basal inferior, basal inferolateral, mid inferior and mid inferolateral location. Findings consistent with prior myocardial infarction with moderate peri-infarct ischemia. This is an intermediate risk study. Nuclear stress EF: 45%. CT attenuation images show evidence of moderate aortic and coronary calcifications.    This result has not been signed. Information might be incomplete.    Patient Profile     75 y.o. male with history of CAD s/p 4-vessel CABG in 2007 with MI in 2018 s/p PCI x 2, CKD stage III, DM2, HTN, HLD, possible COPD with prior tobacco use, obesity, and OSA who we are seeing for NSTEMI.   Assessment & Plan    1. CAD s/p CABG s/p PCI with NSTEMI: -Continues to note intermittent angina -High sensitivity  troponin has trended to 455, cycle to peak -ASA -Heparin gtt  -Tolerating re-challenge of Lipitor so far (historically intolerant to statins), resume PCSK9i at discharge -Hold Imdur with initiation of nitropaste -Lopressor on hold with prior brief episode of 2:1 AV block and 1st degree AV block on 8/28, none since holding of beta blocker  -Echo this admission with preserved LVSF and normal wall motion -NPO -R/LHC today -Gentle IV fluids  -Risks and benefits of cardiac catheterization have been discussed with the patient including risks of  bleeding, bruising, infection, kidney damage, stroke, heart attack, urgent need for bypass surgery, injury to a limb, and death. The patient understands these risks and is willing to proceed with the procedure. All questions have been answered and concerns listened to  2. HFpEF: -Continue to hold IV Lasix as below given AKI -CHF education  -Strict I/O -Daily standing scale weights   3. Acute on CKD stage III: -Improving with IV fluids -Continue to hold IV Lasix   4. HTN: -Blood pressure reasonably controlled  -Amlodipine, and Imdur -Lisinopril held with acute on CKD  5. HLD: -LDL 47 -Historically intolerant to statins -Started on Lipitor this admission in place of PTA lovastatin  -At discharge, continue PCSK9i  6. DM2: -A1c 5.9 earlier this month -SSI per IM  7. Obesity with OSA: -Weight loss recommended through heart healthy diet and exercise  -CPAP  8. 2:1 AV block: -Very brief episode noted on telemetry overnight on 8/28 -No further episodes since holding beta blocker    For questions or updates, please contact [place] Please consult [email or web address] for contact info under Cardiology/STEMI.    Signed, Christell Faith, PA-C [place] Pager: [phone] 11/11/2020, 9:56 AM

## 2020-11-11 NOTE — Plan of Care (Addendum)
  Problem: Education: Goal: Understanding of CV disease, CV risk reduction, and recovery process will improve Outcome: Progressing Goal: Individualized Educational Video(s) Outcome: Progressing   Problem: Cardiovascular: Goal: Ability to achieve and maintain adequate cardiovascular perfusion will improve Outcome: Progressing Goal: Vascular access site(s) Level 0-1 will be maintained Outcome: Progressing

## 2020-11-11 NOTE — Care Management Important Message (Signed)
Important Message  Patient Details  Name: Tony Williams MRN: [id number] Date of Birth: Nov 21, 1945   Medicare Important Message Given:  Yes     Dannette Barbara 11/11/2020, 11:11 AM

## 2020-11-12 ENCOUNTER — Other Ambulatory Visit: Payer: Self-pay

## 2020-11-12 ENCOUNTER — Encounter (HOSPITAL_COMMUNITY)
Admission: AD | Disposition: A | Discharge status: Home / Self Care | Payer: Self-pay | Attending: Cardiovascular Disease

## 2020-11-12 ENCOUNTER — Ambulatory Visit (HOSPITAL_COMMUNITY): Admission: RE | Admit: 2020-11-12 | Payer: Medicare Other | Source: Home / Self Care | Admitting: Cardiovascular Disease

## 2020-11-12 ENCOUNTER — Encounter (HOSPITAL_COMMUNITY): Payer: Self-pay | Admitting: Cardiovascular Disease

## 2020-11-12 DIAGNOSIS — I25700 Atherosclerosis of coronary artery bypass graft(s), unspecified, with unstable angina pectoris: Secondary | ICD-10-CM

## 2020-11-12 DIAGNOSIS — I152 Hypertension secondary to endocrine disorders: Secondary | ICD-10-CM

## 2020-11-12 DIAGNOSIS — J449 Chronic obstructive pulmonary disease, unspecified: Secondary | ICD-10-CM

## 2020-11-12 DIAGNOSIS — E1169 Type 2 diabetes mellitus with other specified complication: Secondary | ICD-10-CM

## 2020-11-12 DIAGNOSIS — E1142 Type 2 diabetes mellitus with diabetic polyneuropathy: Secondary | ICD-10-CM

## 2020-11-12 DIAGNOSIS — I2510 Atherosclerotic heart disease of native coronary artery without angina pectoris: Secondary | ICD-10-CM

## 2020-11-12 DIAGNOSIS — I25810 Atherosclerosis of coronary artery bypass graft(s) without angina pectoris: Secondary | ICD-10-CM | POA: Diagnosis not present

## 2020-11-12 DIAGNOSIS — I5033 Acute on chronic diastolic (congestive) heart failure: Secondary | ICD-10-CM | POA: Diagnosis not present

## 2020-11-12 DIAGNOSIS — E785 Hyperlipidemia, unspecified: Secondary | ICD-10-CM

## 2020-11-12 DIAGNOSIS — E1159 Type 2 diabetes mellitus with other circulatory complications: Secondary | ICD-10-CM

## 2020-11-12 DIAGNOSIS — I214 Non-ST elevation (NSTEMI) myocardial infarction: Secondary | ICD-10-CM | POA: Diagnosis not present

## 2020-11-12 DIAGNOSIS — G4733 Obstructive sleep apnea (adult) (pediatric): Secondary | ICD-10-CM

## 2020-11-12 DIAGNOSIS — I441 Atrioventricular block, second degree: Secondary | ICD-10-CM

## 2020-11-12 DIAGNOSIS — I252 Old myocardial infarction: Secondary | ICD-10-CM

## 2020-11-12 HISTORY — PX: CORONARY STENT INTERVENTION: CATH118234

## 2020-11-12 LAB — BASIC METABOLIC PANEL
Anion gap: 7 (ref 5–15)
BUN: 23 mg/dL (ref 8–23)
CO2: 29 mmol/L (ref 22–32)
Calcium: 8.9 mg/dL (ref 8.9–10.3)
Chloride: 102 mmol/L (ref 98–111)
Creatinine, Ser: 1.77 mg/dL — ABNORMAL HIGH (ref 0.61–1.24)
GFR, Estimated: 40 mL/min — ABNORMAL LOW (ref >60)
Glucose, Bld: 137 mg/dL — ABNORMAL HIGH (ref 70–99)
Potassium: 4.4 mmol/L (ref 3.5–5.1)
Sodium: 138 mmol/L (ref 135–145)

## 2020-11-12 LAB — CBC
HCT: 43.4 % (ref 39.0–52.0)
Hemoglobin: 13.9 g/dL (ref 13.0–17.0)
MCH: 30.2 pg (ref 26.0–34.0)
MCHC: 32 g/dL (ref 30.0–36.0)
MCV: 94.1 fL (ref 80.0–100.0)
Platelets: 151 10*3/uL (ref 150–400)
RBC: 4.61 MIL/uL (ref 4.22–5.81)
RDW: 15.2 % (ref 11.5–15.5)
WBC: 8.8 10*3/uL (ref 4.0–10.5)
nRBC: 0 % (ref 0.0–0.2)

## 2020-11-12 LAB — PROTIME-INR
INR: 1 (ref 0.8–1.2)
Prothrombin Time: 13.6 seconds (ref 11.4–15.2)

## 2020-11-12 LAB — HEPARIN LEVEL (UNFRACTIONATED)
Heparin Unfractionated: 0.14 IU/mL — ABNORMAL LOW (ref 0.30–0.70)
Heparin Unfractionated: 0.24 IU/mL — ABNORMAL LOW (ref 0.30–0.70)

## 2020-11-12 LAB — POCT ACTIVATED CLOTTING TIME: Activated Clotting Time: 480 seconds

## 2020-11-12 LAB — GLUCOSE, CAPILLARY
Glucose-Capillary: 134 mg/dL — ABNORMAL HIGH (ref 70–99)
Glucose-Capillary: 158 mg/dL — ABNORMAL HIGH (ref 70–99)

## 2020-11-12 LAB — MRSA NEXT GEN BY PCR, NASAL: MRSA by PCR Next Gen: NOT DETECTED

## 2020-11-12 SURGERY — CORONARY STENT INTERVENTION
Anesthesia: LOCAL

## 2020-11-12 MED ORDER — ADENOSINE (DIAGNOSTIC) FOR INTRACORONARY USE
INTRAVENOUS | Status: DC | PRN
  Administered 2020-11-12 (×2): 120 ug via INTRACORONARY

## 2020-11-12 MED ORDER — HEPARIN SODIUM (PORCINE) 1000 UNIT/ML IJ SOLN
INTRAMUSCULAR | Status: AC
  Filled 2020-11-12: qty 1

## 2020-11-12 MED ORDER — HEPARIN (PORCINE) IN NACL 1000-0.9 UT/500ML-% IV SOLN
INTRAVENOUS | Status: DC | PRN
  Administered 2020-11-12: 500 mL

## 2020-11-12 MED ORDER — NITROGLYCERIN 1 MG/10 ML FOR IR/CATH LAB
INTRA_ARTERIAL | Status: DC | PRN
  Administered 2020-11-12: 400 ug via INTRACORONARY

## 2020-11-12 MED ORDER — FENTANYL CITRATE (PF) 100 MCG/2ML IJ SOLN
INTRAMUSCULAR | Status: DC | PRN
  Administered 2020-11-12 (×2): 25 ug via INTRAVENOUS

## 2020-11-12 MED ORDER — MIDAZOLAM HCL 2 MG/2ML IJ SOLN
INTRAMUSCULAR | Status: DC | PRN
  Administered 2020-11-12 (×2): 1 mg via INTRAVENOUS

## 2020-11-12 MED ORDER — MIDAZOLAM HCL 2 MG/2ML IJ SOLN
INTRAMUSCULAR | Status: AC
  Filled 2020-11-12: qty 2

## 2020-11-12 MED ORDER — LIDOCAINE HCL (PF) 1 % IJ SOLN
INTRAMUSCULAR | Status: DC | PRN
  Administered 2020-11-12: 2 mL

## 2020-11-12 MED ORDER — SODIUM CHLORIDE 0.9 % IV SOLN: INTRAVENOUS | Status: AC

## 2020-11-12 MED ORDER — HEPARIN SODIUM (PORCINE) 1000 UNIT/ML IJ SOLN
INTRAMUSCULAR | Status: DC | PRN
  Administered 2020-11-12 (×2): 5000 [IU] via INTRAVENOUS

## 2020-11-12 MED ORDER — NITROGLYCERIN 1 MG/10 ML FOR IR/CATH LAB
INTRA_ARTERIAL | Status: AC
  Filled 2020-11-12: qty 10

## 2020-11-12 MED ORDER — HEPARIN (PORCINE) IN NACL 1000-0.9 UT/500ML-% IV SOLN
INTRAVENOUS | Status: AC
  Filled 2020-11-12: qty 1000

## 2020-11-12 MED ORDER — ENOXAPARIN SODIUM 40 MG/0.4ML IJ SOSY
40.0000 mg | PREFILLED_SYRINGE | INTRAMUSCULAR | Status: DC
  Administered 2020-11-13 – 2020-11-14 (×2): 40 mg via SUBCUTANEOUS
  Filled 2020-11-12 (×2): qty 0.4

## 2020-11-12 MED ORDER — SODIUM CHLORIDE 0.9 % IV SOLN: 250.0000 mL | INTRAVENOUS | Status: DC | PRN

## 2020-11-12 MED ORDER — NITROGLYCERIN IN D5W 200-5 MCG/ML-% IV SOLN
2.0000 ug/min | INTRAVENOUS | Status: DC
Start: 2020-11-12 — End: 2020-11-13

## 2020-11-12 MED ORDER — CLOPIDOGREL BISULFATE 300 MG PO TABS
ORAL_TABLET | ORAL | Status: DC | PRN
  Administered 2020-11-12: 300 mg via ORAL

## 2020-11-12 MED ORDER — FENTANYL CITRATE (PF) 100 MCG/2ML IJ SOLN
INTRAMUSCULAR | Status: AC
  Filled 2020-11-12: qty 2

## 2020-11-12 MED ORDER — ADENOSINE 6 MG/2ML IV SOLN
INTRAVENOUS | Status: AC
  Filled 2020-11-12: qty 2

## 2020-11-12 MED ORDER — SODIUM CHLORIDE 0.9% FLUSH: 3.0000 mL | INTRAVENOUS | Status: DC | PRN

## 2020-11-12 MED ORDER — CLOPIDOGREL BISULFATE 300 MG PO TABS
ORAL_TABLET | ORAL | Status: AC
  Filled 2020-11-12: qty 1

## 2020-11-12 MED ORDER — NITROGLYCERIN IN D5W 200-5 MCG/ML-% IV SOLN
INTRAVENOUS | Status: AC
  Administered 2020-11-12: 5 ug/min via INTRAVENOUS
  Filled 2020-11-12: qty 250

## 2020-11-12 MED ORDER — VERAPAMIL HCL 2.5 MG/ML IV SOLN
INTRAVENOUS | Status: DC | PRN
  Administered 2020-11-12: 10 mL via INTRA_ARTERIAL

## 2020-11-12 MED ORDER — IOHEXOL 350 MG/ML SOLN
INTRAVENOUS | Status: DC | PRN
  Administered 2020-11-12: 105 mL

## 2020-11-12 MED ORDER — SODIUM CHLORIDE 0.9% FLUSH
3.0000 mL | Freq: Two times a day (BID) | INTRAVENOUS | Status: DC
  Administered 2020-11-13 – 2020-11-14 (×3): 3 mL via INTRAVENOUS

## 2020-11-12 MED ORDER — CHLORHEXIDINE GLUCONATE CLOTH 2 % EX PADS
6.0000 | MEDICATED_PAD | Freq: Every day | CUTANEOUS | Status: DC
  Administered 2020-11-13 – 2020-11-14 (×2): 6 via TOPICAL

## 2020-11-12 MED ORDER — AMLODIPINE BESYLATE 10 MG PO TABS
10.0000 mg | ORAL_TABLET | Freq: Every day | ORAL | Status: DC
  Administered 2020-11-12 – 2020-11-14 (×3): 10 mg via ORAL
  Filled 2020-11-12 (×3): qty 1

## 2020-11-12 MED ORDER — LIDOCAINE HCL (PF) 1 % IJ SOLN
INTRAMUSCULAR | Status: AC
  Filled 2020-11-12: qty 30

## 2020-11-12 SURGICAL SUPPLY — 19 items
BALLN SAPPHIRE 2.0X12 (BALLOONS) ×2 IMPLANT
BALLN SAPPHIRE ~~LOC~~ 4.0X8 (BALLOONS) ×2 IMPLANT
BALLOON SAPPHIRE 2.0X12 (BALLOONS) ×1 IMPLANT
CATH VISTA GUIDE 6FR AL1 (CATHETERS) ×2 IMPLANT
CATHETER LAUNCHER 6FR LCB (CATHETERS) ×2 IMPLANT
DEVICE RAD COMP TR BAND LRG (VASCULAR PRODUCTS) ×2 IMPLANT
DEVICE SPIDERFX EMB PROT 4MM (WIRE) ×2 IMPLANT
GLIDESHEATH SLEND SS 6F .021 (SHEATH) ×2 IMPLANT
GUIDEWIRE INQWIRE 1.5J.035X260 (WIRE) ×1 IMPLANT
INQWIRE 1.5J .035X260CM (WIRE) ×2 IMPLANT
KIT HEART LEFT (KITS) ×2 IMPLANT
MAT PREVALON FULL STRYKER (MISCELLANEOUS) ×2 IMPLANT
PACK CARDIAC CATHETERIZATION (CUSTOM PROCEDURE TRAY) ×2 IMPLANT
SHEATH PINNACLE 6F 10CM (SHEATH) IMPLANT
STENT ONYX FRONTIER 4.0X15 (Permanent Stent) ×4 IMPLANT
TRANSDUCER W/STOPCOCK (MISCELLANEOUS) ×2 IMPLANT
TUBING CIL FLEX 10 FLL-RA (TUBING) ×2 IMPLANT
WIRE EMERALD 3MM-J .035X150CM (WIRE) IMPLANT
WIRE RUNTHROUGH .014X180CM (WIRE) ×2 IMPLANT

## 2020-11-12 NOTE — Plan of Care (Signed)
Notified regarding low IABP position on CXR read.  Personally reviewed and IABP approximates the carina, short ~ 1 cm.  Elected to remain in place with warm, well perfused extremities and improving renal function.  Some residual chest pain, 5/10 treated with fentanyl just now, at bedside.  HR 70, BP 100/50 on A-line, augmenting appropriate 1:1.  Start nitroglycerin infusion for chest pain relief.  Delton Prairie, MD Cardiology Fellow [facility]

## 2020-11-12 NOTE — Progress Notes (Signed)
[place] for heparin  Indication: ACS with IABP  Allergies  Allergen Reactions   Butorphanol Itching   Morphine Other (See Comments)    Redness around IV site   Simvastatin Other (See Comments)    "Destroyed my muscles"   Statins Other (See Comments)    "Destroyed my muscles"    Patient Measurements: Heparin dosing weight= 98kg   Vital Signs: Temp: 98.1 F (36.7 C) (08/31 0330) Temp Source: Oral (08/31 0330) BP: 119/58 (08/31 0400) Pulse Rate: 72 (08/31 0700)  Labs: Recent Labs    11/10/20 0608 11/10/20 0917 11/10/20 1623 11/11/20 0103 11/11/20 0104 11/12/20 0134 11/12/20 0632  HGB 14.3  --   --  14.4  --  13.9  --   HCT 44.1  --   --  44.8  --  43.4  --   PLT 173  --   --  171  --  151  --   LABPROT  --   --   --   --   --  13.6  --   INR  --   --   --   --   --  1.0  --   HEPARINUNFRC 0.71*  --    < > 0.50  --  0.14* 0.24*  CREATININE 2.03*  --   --   --  1.83* 1.77*  --   TROPONINIHS  --  1,117*  --   --   --   --   --    < > = values in this interval not displayed.     Estimated Creatinine Clearance: 47.5 mL/min (A) (by C-G formula based on SCr of 1.77 mg/dL (H)).   Medical History: Past Medical History:  Diagnosis Date   Cancer [facility]) 2013   Bladder   Chronic kidney disease    COPD (chronic obstructive pulmonary disease) ([place])    Depression    Diabetes mellitus without complication ([place])    H/O heart artery stent    Heart problem     Medications:  Medications Prior to Admission  Medication Sig Dispense Refill Last Dose   albuterol (VENTOLIN HFA) 108 (90 Base) MCG/ACT inhaler Inhale 2 puffs into the lungs every 6 (six) hours as needed for wheezing or shortness of breath. 8 g 2    allopurinol (ZYLOPRIM) 300 MG tablet Take 1 tablet (300 mg total) by mouth daily. 90 tablet 3    amLODipine (NORVASC) 10 MG tablet Take 1 tablet (10 mg total) by mouth daily. 90 tablet 1    aspirin EC 81 MG tablet Take 81 mg by  mouth daily. Swallow whole.      atorvastatin (LIPITOR) 80 MG tablet Take 1 tablet (80 mg total) by mouth daily.      blood glucose meter kit and supplies Dispense based on patient and insurance preference. Use up to four times daily as directed. (FOR ICD-10 E10.9, E11.9). check blood glucose fasting and before meals four times daily. 1 each 0    Cholecalciferol (VITAMIN D3 PO) Take 4,000 Units by mouth.      clopidogrel (PLAVIX) 75 MG tablet Take 1 tablet (75 mg total) by mouth daily. 90 tablet 1    Coenzyme Q10 (CO Q-10) 400 MG CAPS Take by mouth daily at 6 (six) AM.      desoximetasone (TOPICORT) 0.25 % cream Apply 1 application topically 2 (two) times daily.      escitalopram (LEXAPRO) 20 MG tablet Take 1 tablet (20 mg  total) by mouth daily. 90 tablet 1    Evolocumab (REPATHA SURECLICK) 008 MG/ML SOAJ Inject 1 pen into the skin every 14 (fourteen) days. 2 mL 11    fluticasone-salmeterol (ADVAIR HFA) 230-21 MCG/ACT inhaler Inhale 1-2 puffs into the lungs 2 (two) times daily. Rinse mouth after each use. 1 each 12    gabapentin (NEURONTIN) 300 MG capsule Take 2 capsules (600 mg total) by mouth at bedtime. 180 capsule 1    heparin 25000 UT/250ML infusion Inject 1,800 Units/hr into the vein continuous.      HYDROcodone-acetaminophen (NORCO) 5-325 MG tablet Take 1 tablet by mouth every 8 (eight) hours as needed for moderate pain. 90 tablet 0    lisinopril (ZESTRIL) 5 MG tablet Take 1 tablet (5 mg total) by mouth daily. 90 tablet 3    metoprolol tartrate (LOPRESSOR) 25 MG tablet Take 25 mg by mouth daily at 6 (six) AM.      nitroGLYCERIN (NITROGLYN) 2 % ointment Apply 1 inch topically every 6 (six) hours. 30 g 0    Omega-3 Fatty Acids (FISH OIL) 1200 MG CAPS Take by mouth daily at 6 (six) AM.      traZODone (DESYREL) 150 MG tablet Take 1-2 tablets (150-300 mg total) by mouth at bedtime as needed for sleep. 180 tablet 1    Scheduled:   allopurinol  300 mg Oral Daily   aspirin  81 mg Oral Pre-Cath    aspirin EC  81 mg Oral Daily   atorvastatin  80 mg Oral Daily   Chlorhexidine Gluconate Cloth  6 each Topical Daily   cholecalciferol  4,000 Units Oral Daily   clopidogrel  75 mg Oral Daily   escitalopram  20 mg Oral Daily   gabapentin  600 mg Oral QHS   omega-3 acid ethyl esters  1 g Oral BID   sodium chloride flush  3 mL Intravenous Q12H    Assessment: 75 yo male with NSTEMI (history CAD and CABG) and s/p cath with multivessel disease for high risk PCI. An IABP was placed in cath 8/30.   Heparin level within range this morning after rate adjustment overnight on 1700 units/hr. No bleeding issues noted. Hgb stable, plt trending down from 202 to 151 this morning.   Goal of Therapy:  Heparin level= 0.2-0.5 Monitor platelets by anticoagulation protocol: Yes   Plan:  -Continue heparin at 1700 units/hr -Heparin level daily wth CBC daily  Erin Hearing PharmD., BCPS Clinical Pharmacist 11/12/2020 7:29 AM

## 2020-11-12 NOTE — Progress Notes (Signed)
8FR IABP aspirated and removed from RFA, manual pressure applied for 30 minutes. Site level 0, no s+s of hematoma. Tegaderm dressing applied, bedrest instructions given.   Bilateral dp and pt pulses present with doppler. Left side pulses slightly weaker than right.    Bedrest begins at 21:30:00

## 2020-11-12 NOTE — Progress Notes (Addendum)
Around 0200, arterial IABP waveform on monitor dampened out. I flushed the IABP per protocol and it flushes well, then waveform improves on screen. Arterial waveform on the IABP home screen remains WNL despite dampened waveform on bedside monitor.   This has continued to happen throughout the night and waveforms on actual IABP remain good. Will be using arterial waveform on IABP for BP monitoring.   Addendum '@0600'$ : Qhr flushes now noted to be sluggish. Arterial waveform remains dampened. Waveform on IABP remains WNL. No kinks or obvious obstructions noted.

## 2020-11-12 NOTE — Progress Notes (Addendum)
Progress Note  Patient Name: Tony Williams Date of Encounter: 11/12/2020  [facility] Cardiologist: Kate Sable, MD    Patient Profile     75 y.o. male history of CAD-CABG (& PCI x 2), HTN/HLD & DM-2 with CKD-3B who presented to [facility]) with a non-STEMI and Acute on Chronic Diastolic Heart Failure.   Prior to going for catheterization, he was treated with IV diuresis which led to mild A on CKI actually treated with gentle hydration.  He continued to have ongoing chest pain and dyspnea therefore underwent cardiac catheterization yesterday by Dr. Saunders Revel at [facility] revealing severe native vessel CAD as previously indicated but also severe graft/anastomosis disease.  Due to the severity of disease and ongoing symptoms, he underwent IABP placement and was transferred to [facility] with planned high risk PCI today with Dr. Fletcher Anon.  Assessment & Plan    CAD status post CABG - PCI Presented with  NSTEMI:  LHC on 8/30 showed severe native coronary and bypass graft disease requiring IABP placement & txfr to [facility] for planned High Risk SVG PCI 8/31 with Dr. Fletcher Anon.  Placed on IV Heparin gtt for ACS/IABP  Now on aspirin and Plavix  On nitro drip.  Amlodipine stopped. Converted to atorvastatin from lovastatin.  (On PCSK9 inhibitor at home) N.p.o. at midnight.   Risks and benefits of cardiac catheterization have been discussed with the patient including risks of bleeding, bruising, infection, kidney damage, stroke, heart attack, injury to a limb, urgent need for bypass, and death. The patient understands these risks and is willing to proceed with the procedure. All questions have been answered and concerns listened to.    HFpEF-initially with acute on chronic HFpEF: Difficult to assess volume status due to body habitus-however wedge pressure was read at 22 mmHg in the setting of right heart cath yesterday.  IV Lasix has been on hold renal function.  Would like to  continue to hold for planned cath today.  Can reassess EDP in Cath Lab determine volume status.Marland Kitchen     2:1 AV block (overnight 8/28 2022): Beta-blocker stopped.  No further episodes (mostly sinus rhythm on monitor..  Can reassess post PCI.   Acute on CKD stage IIIb: Gentle hydration with improvement prior to cath.  Lasix on hold.  Creatinine down to 1.77 today (which seems to be his baseline). Continue to monitor post cath   HTN: Blood pressure has been reasonably controlled.  He remains on amlodipine and Imdur.  Lisinopril held for acute on CKD.  Beta-blocker held for brief episode of 2-1 AV block noted on telemetry.   HLD (historically intolerant to statins however currently on Lipitor along with PCSK9 hematuria.): LDL 47.    At discharge, continue PCSK9i.   DM2: A1c 5.9 earlier this month.   SSI.  Sugars well controlled.   COPD: No active exacerbation.  Stable.  Not currently on inhaled or p.o. medications   Obesity with OSA: Weight loss is encouraged to heart of the diet and regular exercise.   Has refused CPAP in the past.  Continue to counsel in the outpatient  ---------------------------------------------------------------------------------------------------------  Subjective   Mostly complaining of back discomfort from having to lie flat with balloon pump in place.  Still feels a little "aching sensation, but notably less chest pain and dyspnea from the insertion of the balloon pump.  Inpatient Medications    Scheduled Meds:  allopurinol  300 mg Oral Daily   aspirin  81 mg Oral Pre-Cath  aspirin EC  81 mg Oral Daily   atorvastatin  80 mg Oral Daily   Chlorhexidine Gluconate Cloth  6 each Topical Daily   cholecalciferol  4,000 Units Oral Daily   clopidogrel  75 mg Oral Daily   escitalopram  20 mg Oral Daily   gabapentin  600 mg Oral QHS   omega-3 acid ethyl esters  1 g Oral BID   sodium chloride flush  3 mL Intravenous Q12H   Continuous Infusions:  sodium chloride 75  mL/hr at 11/12/20 0700   sodium chloride     sodium chloride     heparin 1,700 Units/hr (11/12/20 0700)   nitroGLYCERIN 7.5 mcg/min (11/12/20 0700)   PRN Meds: sodium chloride, acetaminophen, fentaNYL (SUBLIMAZE) injection, HYDROcodone-acetaminophen, nitroGLYCERIN, ondansetron (ZOFRAN) IV, sodium chloride flush, traZODone   Vital Signs    Vitals:   11/12/20 0530 11/12/20 0600 11/12/20 0630 11/12/20 0700  BP:      Pulse: (!) 144 (!) 139 (!) 146 72  Resp: '14 14 20 18  '$ Temp:      TempSrc:      SpO2: 92% 92% 92% 91%  Weight:      Height:        Intake/Output Summary (Last 24 hours) at 11/12/2020 0758 Last data filed at 11/12/2020 0700 Gross per 24 hour  Intake 1189.57 ml  Output 510 ml  Net 679.57 ml   Last 3 Weights 11/12/2020 11/11/2020 11/11/2020  Weight (lbs) 271 lb 13.2 oz 264 lb 5.3 oz 264 lb 5.3 oz  Weight (kg) 123.3 kg 119.9 kg 119.9 kg      Telemetry    Essentially sinus rhythm with overnight rates down into the high 40s with mostly 60s and 70s.  Rare PVCs.  Significant artifact.- Personally Reviewed  ECG    NSR 64 bpm.  Septal MI, old.  ST and T wave abnormality, consider lateral ischemia (less prominent compared to previous EKG).- Personally Reviewed  Physical Exam   GEN: Morbidly obese gentleman laying in bed in mild to moderate distress from back pain.  Somewhat uncomfortable.  Balloon pump in place. Neck: Unable to assess JVD or bruit due to body habitus. Cardiac: RRR, no murmurs, rubs, or gallops.  Normal S1 and S2. Respiratory: Clear to auscultation bilaterally.  Nonlabored, regular movement. GI: Soft, nontender, non-distended  MS: No edema; No deformity.  Pedal pulses palpable.  Right RFA IABP in place.  Initially was not transducing.  I minimally manipulated the catheter while on standby and was able to aspirate and flush the line. Neuro:  Nonfocal  Psych: Normal affect    Cardiac Studies   Lexiscan MPI 05/2020: Large defect of severe severity present  in the basal inferior, basal inferior lateral, mid inferior, and mid inferolateral location.  Findings were consistent with prior MI with moderate peri-infarct ischemia.  EF was 45%.  Overall, this was an intermediate risk study. 2D Echo 11/08/2020: (Difficult study) EF 50-55% (previously 60 to 65% in March 2020 with no R WMA.).  Low normal function.  Apical HK.  GRII relatively stable. R&LHC 11/11/2020 (Dr. Saunders Revel): See above.  RA (mean): 10 mmHg; RV (S/EDP): 50/11 mmHg; PA (S/D, mean): 50/20 mmHg; PCWP (mean): 22 mmHg; ao sat 96%, PA sat 80%.  Fick Cardiac Output-Index: 8.0 - 3.4 -> IABP placed    Labs    High Sensitivity Troponin:   Recent Labs  Lab 11/08/20 0418 11/08/20 0616 11/10/20 0917  TROPONINIHS 376* 455* 1,117*      Chemistry Recent Labs  Lab 11/08/20 0418 11/09/20 0436 11/10/20 0608 11/11/20 0104 11/12/20 0134  NA 139   < > 137 138 138  K 4.7   < > 4.4 4.0 4.4  CL 103   < > 99 98 102  CO2 32   < > 32 31 29  GLUCOSE 176*   < > 122* 142* 137*  BUN 27*   < > 34* 33* 23  CREATININE 1.73*   < > 2.03* 1.83* 1.77*  CALCIUM 9.3   < > 8.9 8.6* 8.9  PROT 7.2  --   --   --   --   ALBUMIN 3.7  --   --   --   --   AST 16  --   --   --   --   ALT 11  --   --   --   --   ALKPHOS 50  --   --   --   --   BILITOT 0.7  --   --   --   --   GFRNONAA 41*   < > 34* 38* 40*  ANIONGAP 4*   < > '6 9 7   '$ < > = values in this interval not displayed.     Hematology Recent Labs  Lab 11/10/20 0608 11/11/20 0103 11/12/20 0134  WBC 9.2 8.8 8.8  RBC 4.67 4.88 4.61  HGB 14.3 14.4 13.9  HCT 44.1 44.8 43.4  MCV 94.4 91.8 94.1  MCH 30.6 29.5 30.2  MCHC 32.4 32.1 32.0  RDW 15.4 15.0 15.2  PLT 173 171 151    BNP Recent Labs  Lab 11/08/20 0558  BNP 138.7*     DDimer No results for input(s): DDIMER in the last 168 hours.   Radiology    CARDIAC CATHETERIZATION  Result Date: 11/11/2020 Conclusions: Severe native coronary artery disease including 90% distal LMCA stenosis, 100% mid  LAD occlusion, 100% ostial LCx occlusion, and severe diffuse RCA disease. Widely patent LIMA-D1. Patent sequential SVG-D2-LAD with 99% mid graft stenosis as well as 90% stenosis at the LIMA-D2 anastomosis. Chronically occluded, previously stented SVG-OM. Mildly elevated left and right heart filling pressures. Moderate pulmonary hypertension. Normal-supranormal Fick cardiac output/index. Successful placement of 50 mL intra-aortic balloon pump via the right common femoral artery. Recommendations: Images reviewed with Dr. Fletcher Anon.  We agree that patient is high risk for revascularization given his severe native and graft disease.  His distal targets are small and diffusely diseased, making them suboptimal for redo-CABG.  We will transfer to [facility] for possible high risk PCI to SVG-D2-LAD tomorrow. Titrate IV nitroglycerin for relief of chest pain. Aggressive secondary prevention. Nelva Bush, MD [facility]  DG CHEST PORT 1 VIEW  Result Date: 11/11/2020 CLINICAL DATA:  Intra-aortic balloon pump EXAM: PORTABLE CHEST 1 VIEW COMPARISON:  11/08/2020 FINDINGS: Post sternotomy changes with redemonstrated fractures through multiple sternal wires. Cardiomegaly with vascular congestion and mild pulmonary edema. Small right-sided pleural effusion. Interim placement of intra-aortic balloon pump, slightly low positioning of radiopaque tip which is visible about 6 cm inferior to the aortic arch. No pneumothorax is seen IMPRESSION: 1. Low positioning of intra-aortic balloon pump with radiopaque tip about 6 cm inferior to the aortic arch 2. Cardiomegaly with vascular congestion, pulmonary edema and small right pleural effusion These results will be called to the ordering clinician or representative by the Radiologist Assistant, and communication documented in the PACS or [employer]. Electronically Signed   By: Donavan Foil M.D.   On:  11/11/2020 22:22       For questions or updates, please contact [place] Please consult [email or web address] for contact info under        Signed, Glenetta Hew, MD  11/12/2020, 7:58 AM

## 2020-11-12 NOTE — H&P (View-Only) (Signed)
Progress Note  Patient Name: Tony Williams Date of Encounter: 11/12/2020  [facility] Cardiologist: Kate Sable, MD    Patient Profile     75 y.o. male history of CAD-CABG (& PCI x 2), HTN/HLD & DM-2 with CKD-3B who presented to [facility]) with a non-STEMI and Acute on Chronic Diastolic Heart Failure.   Prior to going for catheterization, he was treated with IV diuresis which led to mild A on CKI actually treated with gentle hydration.  He continued to have ongoing chest pain and dyspnea therefore underwent cardiac catheterization yesterday by Dr. Saunders Revel at [facility] revealing severe native vessel CAD as previously indicated but also severe graft/anastomosis disease.  Due to the severity of disease and ongoing symptoms, he underwent IABP placement and was transferred to [facility] with planned high risk PCI today with Dr. Fletcher Anon.  Assessment & Plan    CAD status post CABG - PCI Presented with  NSTEMI:  LHC on 8/30 showed severe native coronary and bypass graft disease requiring IABP placement & txfr to [facility] for planned High Risk SVG PCI 8/31 with Dr. Fletcher Anon.  Placed on IV Heparin gtt for ACS/IABP  Now on aspirin and Plavix  On nitro drip.  Amlodipine stopped. Converted to atorvastatin from lovastatin.  (On PCSK9 inhibitor at home) N.p.o. at midnight.   Risks and benefits of cardiac catheterization have been discussed with the patient including risks of bleeding, bruising, infection, kidney damage, stroke, heart attack, injury to a limb, urgent need for bypass, and death. The patient understands these risks and is willing to proceed with the procedure. All questions have been answered and concerns listened to.    HFpEF-initially with acute on chronic HFpEF: Difficult to assess volume status due to body habitus-however wedge pressure was read at 22 mmHg in the setting of right heart cath yesterday.  IV Lasix has been on hold renal function.  Would like to  continue to hold for planned cath today.  Can reassess EDP in Cath Lab determine volume status.Marland Kitchen     2:1 AV block (overnight 8/28 2022): Beta-blocker stopped.  No further episodes (mostly sinus rhythm on monitor..  Can reassess post PCI.   Acute on CKD stage IIIa-b: Gentle hydration with improvement prior to cath.  Lasix on hold.  Creatinine down to 1.77 today (which seems to be his baseline). Continue to monitor post cath   HTN: Blood pressure has been reasonably controlled.  He remains on amlodipine and Imdur.  Lisinopril held for acute on CKD.  Beta-blocker held for brief episode of 2-1 AV block noted on telemetry.   HLD (historically intolerant to statins however currently on Lipitor along with PCSK9 hematuria.): LDL 47.    At discharge, continue PCSK9i.   DM2: A1c 5.9 earlier this month.   SSI.  Sugars well controlled.   COPD: No active exacerbation.  Stable.  Not currently on inhaled or p.o. medications   Obesity with OSA: Weight loss is encouraged to heart of the diet and regular exercise.   Has refused CPAP in the past.  Continue to counsel in the outpatient  ---------------------------------------------------------------------------------------------------------  Subjective   Mostly complaining of back discomfort from having to lie flat with balloon pump in place.  Still feels a little "aching sensation, but notably less chest pain and dyspnea from the insertion of the balloon pump.  Inpatient Medications    Scheduled Meds:  allopurinol  300 mg Oral Daily   aspirin  81 mg Oral Pre-Cath  aspirin EC  81 mg Oral Daily   atorvastatin  80 mg Oral Daily   Chlorhexidine Gluconate Cloth  6 each Topical Daily   cholecalciferol  4,000 Units Oral Daily   clopidogrel  75 mg Oral Daily   escitalopram  20 mg Oral Daily   gabapentin  600 mg Oral QHS   omega-3 acid ethyl esters  1 g Oral BID   sodium chloride flush  3 mL Intravenous Q12H   Continuous Infusions:  sodium chloride 75  mL/hr at 11/12/20 0700   sodium chloride     sodium chloride     heparin 1,700 Units/hr (11/12/20 0700)   nitroGLYCERIN 7.5 mcg/min (11/12/20 0700)   PRN Meds: sodium chloride, acetaminophen, fentaNYL (SUBLIMAZE) injection, HYDROcodone-acetaminophen, nitroGLYCERIN, ondansetron (ZOFRAN) IV, sodium chloride flush, traZODone   Vital Signs    Vitals:   11/12/20 0530 11/12/20 0600 11/12/20 0630 11/12/20 0700  BP:      Pulse: (!) 144 (!) 139 (!) 146 72  Resp: '14 14 20 18  '$ Temp:      TempSrc:      SpO2: 92% 92% 92% 91%  Weight:      Height:        Intake/Output Summary (Last 24 hours) at 11/12/2020 0758 Last data filed at 11/12/2020 0700 Gross per 24 hour  Intake 1189.57 ml  Output 510 ml  Net 679.57 ml   Last 3 Weights 11/12/2020 11/11/2020 11/11/2020  Weight (lbs) 271 lb 13.2 oz 264 lb 5.3 oz 264 lb 5.3 oz  Weight (kg) 123.3 kg 119.9 kg 119.9 kg      Telemetry    Essentially sinus rhythm with overnight rates down into the high 40s with mostly 60s and 70s.  Rare PVCs.  Significant artifact.- Personally Reviewed  ECG    NSR 64 bpm.  Septal MI, old.  ST and T wave abnormality, consider lateral ischemia (less prominent compared to previous EKG).- Personally Reviewed  Physical Exam   GEN: Morbidly obese gentleman laying in bed in mild to moderate distress from back pain.  Somewhat uncomfortable.  Balloon pump in place. Neck: Unable to assess JVD or bruit due to body habitus. Cardiac: RRR, no murmurs, rubs, or gallops.  Normal S1 and S2. Respiratory: Clear to auscultation bilaterally.  Nonlabored, regular movement. GI: Soft, nontender, non-distended  MS: No edema; No deformity.  Pedal pulses palpable.  Right RFA IABP in place.  Initially was not transducing.  I minimally manipulated the catheter while on standby and was able to aspirate and flush the line. Neuro:  Nonfocal  Psych: Normal affect    Cardiac Studies   Lexiscan MPI 05/2020: Large defect of severe severity present  in the basal inferior, basal inferior lateral, mid inferior, and mid inferolateral location.  Findings were consistent with prior MI with moderate peri-infarct ischemia.  EF was 45%.  Overall, this was an intermediate risk study. 2D Echo 11/08/2020: (Difficult study) EF 50-55% (previously 60 to 65% in March 2020 with no R WMA.).  Low normal function.  Apical HK.  GRII relatively stable. R&LHC 11/11/2020 (Dr. Saunders Revel): See above.  RA (mean): 10 mmHg; RV (S/EDP): 50/11 mmHg; PA (S/D, mean): 50/20 mmHg; PCWP (mean): 22 mmHg; ao sat 96%, PA sat 80%.  Fick Cardiac Output-Index: 8.0 - 3.4 -> IABP placed    Labs    High Sensitivity Troponin:   Recent Labs  Lab 11/08/20 0418 11/08/20 0616 11/10/20 0917  TROPONINIHS 376* 455* 1,117*      Chemistry Recent Labs  Lab 11/08/20 0418 11/09/20 0436 11/10/20 0608 11/11/20 0104 11/12/20 0134  NA 139   < > 137 138 138  K 4.7   < > 4.4 4.0 4.4  CL 103   < > 99 98 102  CO2 32   < > 32 31 29  GLUCOSE 176*   < > 122* 142* 137*  BUN 27*   < > 34* 33* 23  CREATININE 1.73*   < > 2.03* 1.83* 1.77*  CALCIUM 9.3   < > 8.9 8.6* 8.9  PROT 7.2  --   --   --   --   ALBUMIN 3.7  --   --   --   --   AST 16  --   --   --   --   ALT 11  --   --   --   --   ALKPHOS 50  --   --   --   --   BILITOT 0.7  --   --   --   --   GFRNONAA 41*   < > 34* 38* 40*  ANIONGAP 4*   < > '6 9 7   '$ < > = values in this interval not displayed.     Hematology Recent Labs  Lab 11/10/20 0608 11/11/20 0103 11/12/20 0134  WBC 9.2 8.8 8.8  RBC 4.67 4.88 4.61  HGB 14.3 14.4 13.9  HCT 44.1 44.8 43.4  MCV 94.4 91.8 94.1  MCH 30.6 29.5 30.2  MCHC 32.4 32.1 32.0  RDW 15.4 15.0 15.2  PLT 173 171 151    BNP Recent Labs  Lab 11/08/20 0558  BNP 138.7*     DDimer No results for input(s): DDIMER in the last 168 hours.   Radiology    CARDIAC CATHETERIZATION  Result Date: 11/11/2020 Conclusions: Severe native coronary artery disease including 90% distal LMCA stenosis, 100% mid  LAD occlusion, 100% ostial LCx occlusion, and severe diffuse RCA disease. Widely patent LIMA-D1. Patent sequential SVG-D2-LAD with 99% mid graft stenosis as well as 90% stenosis at the LIMA-D2 anastomosis. Chronically occluded, previously stented SVG-OM. Mildly elevated left and right heart filling pressures. Moderate pulmonary hypertension. Normal-supranormal Fick cardiac output/index. Successful placement of 50 mL intra-aortic balloon pump via the right common femoral artery. Recommendations: Images reviewed with Dr. Fletcher Anon.  We agree that patient is high risk for revascularization given his severe native and graft disease.  His distal targets are small and diffusely diseased, making them suboptimal for redo-CABG.  We will transfer to [facility] for possible high risk PCI to SVG-D2-LAD tomorrow. Titrate IV nitroglycerin for relief of chest pain. Aggressive secondary prevention. Nelva Bush, MD [facility]  DG CHEST PORT 1 VIEW  Result Date: 11/11/2020 CLINICAL DATA:  Intra-aortic balloon pump EXAM: PORTABLE CHEST 1 VIEW COMPARISON:  11/08/2020 FINDINGS: Post sternotomy changes with redemonstrated fractures through multiple sternal wires. Cardiomegaly with vascular congestion and mild pulmonary edema. Small right-sided pleural effusion. Interim placement of intra-aortic balloon pump, slightly low positioning of radiopaque tip which is visible about 6 cm inferior to the aortic arch. No pneumothorax is seen IMPRESSION: 1. Low positioning of intra-aortic balloon pump with radiopaque tip about 6 cm inferior to the aortic arch 2. Cardiomegaly with vascular congestion, pulmonary edema and small right pleural effusion These results will be called to the ordering clinician or representative by the Radiologist Assistant, and communication documented in the PACS or [employer]. Electronically Signed   By: Donavan Foil M.D.   On:  11/11/2020 22:22       For questions or updates, please contact [place] Please consult [email or web address] for contact info under        Signed, Glenetta Hew, MD  11/12/2020, 7:58 AM

## 2020-11-12 NOTE — Interval H&P Note (Signed)
Cath Lab Visit (complete for each Cath Lab visit)  Clinical Evaluation Leading to the Procedure:   ACS: Yes.    Non-ACS:  n/a    History and Physical Interval Note:  11/12/2020 1:14 PM  Tony Williams  has presented today for surgery, with the diagnosis of nstemi.  The various methods of treatment have been discussed with the patient and family. After consideration of risks, benefits and other options for treatment, the patient has consented to  Procedure(s): CORONARY STENT INTERVENTION (N/A) as a surgical intervention.  The patient's history has been reviewed, patient examined, no change in status, stable for surgery.  I have reviewed the patient's chart and labs.  Questions were answered to the patient's satisfaction.     [name]

## 2020-11-13 ENCOUNTER — Encounter (HOSPITAL_COMMUNITY): Payer: Self-pay | Admitting: Cardiovascular Disease

## 2020-11-13 ENCOUNTER — Other Ambulatory Visit: Payer: Self-pay | Admitting: *Deleted

## 2020-11-13 DIAGNOSIS — I5033 Acute on chronic diastolic (congestive) heart failure: Secondary | ICD-10-CM | POA: Diagnosis not present

## 2020-11-13 DIAGNOSIS — I2510 Atherosclerotic heart disease of native coronary artery without angina pectoris: Secondary | ICD-10-CM | POA: Diagnosis not present

## 2020-11-13 DIAGNOSIS — E1142 Type 2 diabetes mellitus with diabetic polyneuropathy: Secondary | ICD-10-CM | POA: Diagnosis not present

## 2020-11-13 DIAGNOSIS — I214 Non-ST elevation (NSTEMI) myocardial infarction: Secondary | ICD-10-CM | POA: Diagnosis not present

## 2020-11-13 LAB — CBC
HCT: 41.6 % (ref 39.0–52.0)
Hemoglobin: 13 g/dL (ref 13.0–17.0)
MCH: 29.6 pg (ref 26.0–34.0)
MCHC: 31.3 g/dL (ref 30.0–36.0)
MCV: 94.8 fL (ref 80.0–100.0)
Platelets: 137 10*3/uL — ABNORMAL LOW (ref 150–400)
RBC: 4.39 MIL/uL (ref 4.22–5.81)
RDW: 15.1 % (ref 11.5–15.5)
WBC: 10.1 10*3/uL (ref 4.0–10.5)
nRBC: 0 % (ref 0.0–0.2)

## 2020-11-13 LAB — BASIC METABOLIC PANEL
Anion gap: 6 (ref 5–15)
BUN: 22 mg/dL (ref 8–23)
CO2: 30 mmol/L (ref 22–32)
Calcium: 8.4 mg/dL — ABNORMAL LOW (ref 8.9–10.3)
Chloride: 102 mmol/L (ref 98–111)
Creatinine, Ser: 1.89 mg/dL — ABNORMAL HIGH (ref 0.61–1.24)
GFR, Estimated: 37 mL/min — ABNORMAL LOW (ref >60)
Glucose, Bld: 152 mg/dL — ABNORMAL HIGH (ref 70–99)
Potassium: 4.5 mmol/L (ref 3.5–5.1)
Sodium: 138 mmol/L (ref 135–145)

## 2020-11-13 LAB — GLUCOSE, CAPILLARY
Glucose-Capillary: 130 mg/dL — ABNORMAL HIGH (ref 70–99)
Glucose-Capillary: 142 mg/dL — ABNORMAL HIGH (ref 70–99)
Glucose-Capillary: 147 mg/dL — ABNORMAL HIGH (ref 70–99)
Glucose-Capillary: 156 mg/dL — ABNORMAL HIGH (ref 70–99)
Glucose-Capillary: 157 mg/dL — ABNORMAL HIGH (ref 70–99)
Glucose-Capillary: 159 mg/dL — ABNORMAL HIGH (ref 70–99)

## 2020-11-13 MED ORDER — LISINOPRIL 2.5 MG PO TABS
2.5000 mg | ORAL_TABLET | Freq: Every day | ORAL | Status: DC
  Administered 2020-11-13 – 2020-11-14 (×2): 2.5 mg via ORAL
  Filled 2020-11-13 (×2): qty 1

## 2020-11-13 MED ORDER — LISINOPRIL 5 MG PO TABS: 5.0000 mg | ORAL_TABLET | Freq: Every day | ORAL | Status: DC

## 2020-11-13 MED ORDER — METOPROLOL TARTRATE 12.5 MG HALF TABLET
12.5000 mg | ORAL_TABLET | Freq: Two times a day (BID) | ORAL | Status: DC
  Administered 2020-11-13: 12.5 mg via ORAL
  Filled 2020-11-13: qty 1

## 2020-11-13 NOTE — Progress Notes (Signed)
CARDIAC REHAB PHASE I   Offered to walk with pt. Pt states recent return from walk with RN. Pt notes fatigue and weakness, denies CP, SOB, or dizziness. Pt states he feels much better today. Pt educated on importance of ASA and Plavix. Given MI Book, stent card, and heart healthy diet. Reviewed site care, restrictions, and exercise guidelines. Will refer to CRP II [place]. Pt and wife deny questions or concerns. Will continue to follow.  [id number] Rufina Falco, RN BSN 11/13/2020 1:40 PM

## 2020-11-13 NOTE — Progress Notes (Signed)
Progress Note  Patient Name: Tony Williams Date of Encounter: 11/13/2020  [facility] Cardiologist: Kate Sable, MD    Patient Profile     75 y.o. male history of CAD-CABG (& PCI x 2), HTN/HLD & DM-2 with CKD-3B who presented to [facility]) with a non-STEMI and Acute on Chronic Diastolic Heart Failure.   Prior to going for catheterization, he was treated with IV diuresis which led to mild A on CKI actually treated with gentle hydration.  He continued to have ongoing chest pain and dyspnea therefore underwent cardiac catheterization yesterday by Dr. Saunders Revel at [facility] revealing severe native vessel CAD as previously indicated but also severe graft/anastomosis disease.  Due to the severity of disease and ongoing symptoms, he underwent IABP placement and was transferred to [facility] with planned high risk PCI today with Dr. Fletcher Anon.  Assessment & Plan    CAD status post CABG - PCI Presented with  NSTEMI:  LHC on 8/30 showed severe native coronary and bypass graft disease requiring IABP placement & txfr to [facility] for planned High Risk SVG PCI 8/31 with Dr. Fletcher Anon. --> Staged PCI to 2 sites in the SVG-D2-LAD with excellent results.  Downstream target vessels look much better. IABP removed last night.  No longer on IV heparin. DAPT with ASA and Plavix. Nitroglycerin drip discontinued.  Home dose of amlodipine restarted. On PCSK9 him at home.  Converted to atorvastatin here. Will start very low-dose beta-blocker and ACE inhibitor back.    HFpEF-initially with acute on chronic HFpEF: LVEDP roughly 10 yesterday during cath.  Seems to be euvolemic on exam.  No edema no PND orthopnea.   Amlodipine restarted yesterday, will reinitiate low-dose ACE inhibitor and beta-blocker (one half original doses-2.5 mg lisinopril and Lopressor twice daily 12.5 mg)   2:1 AV block (overnight 8/28 2022): Beta-blocker stopped.  Has not had any further episodes.  We will try to restart  low-dose beta-blocker as his heart rate is trended up.   Acute on CKD stage IIIa-b: Stabilized now.  Slight increase in creatinine post cath, but still within his baseline level.  Would like to recheck on outpatient follow-up.   HTN: Blood pressure steadily increasing.  Amlodipine restarted yesterday as nitroglycerin discontinued.  We will reinitiate ACE inhibitor now that he is post cath, but will start at half dose.  We will also restart very low-dose beta-blocker.     HLD (historically intolerant to statins however currently on Lipitor along with PCSK9 hematuria.): LDL 47.   Seems to be tolerating atorvastatin.  Continue PCSK9 at home.   DM2: A1c 5.9 earlier this month.   Sugars controlled with SSI. Reassess in the outpatient with PCP.  Would consider SGLT2 inhibitor +/- metformin   COPD: Stable, not on current meds.   Obesity with OSA:  Reiterated need for CPAP use.  Continue to counsel outpatient.  Continue weight loss attempts with diet and exercise.  Dispo: He looks a lot better feels a lot better.  Would like to see what his blood pressure does with the addition of low-dose beta-blocker and losartan.  If he does well anticipate discharge later on this afternoon.  Otherwise will monitor till tomorrow.  ---------------------------------------------------------------------------------------------------------  Subjective   Result reviewed yesterday.  Outstanding result.  IVP removed last night. Feels great today.  Sitting up in bed no chest pain or dyspnea.  No issues with the IABP site.  Inpatient Medications    Scheduled Meds:  allopurinol  300 mg Oral Daily  amLODipine  10 mg Oral Daily   aspirin EC  81 mg Oral Daily   atorvastatin  80 mg Oral Daily   Chlorhexidine Gluconate Cloth  6 each Topical Daily   cholecalciferol  4,000 Units Oral Daily   clopidogrel  75 mg Oral Daily   enoxaparin (LOVENOX) injection  40 mg Subcutaneous Q24H   escitalopram  20 mg Oral Daily    gabapentin  600 mg Oral QHS   omega-3 acid ethyl esters  1 g Oral BID   sodium chloride flush  3 mL Intravenous Q12H   sodium chloride flush  3 mL Intravenous Q12H   Continuous Infusions:  sodium chloride     nitroGLYCERIN Stopped (11/13/20 0149)   PRN Meds: sodium chloride, acetaminophen, fentaNYL (SUBLIMAZE) injection, HYDROcodone-acetaminophen, nitroGLYCERIN, ondansetron (ZOFRAN) IV, sodium chloride flush, traZODone   Vital Signs    Vitals:   11/13/20 0500 11/13/20 0600 11/13/20 0700 11/13/20 0800  BP: (!) 151/57 138/69 (!) 135/53 (!) 129/49  Pulse: 75 74 90 77  Resp: '15 17 17 '$ (!) 22  Temp:  98.2 F (36.8 C)    TempSrc:  Oral    SpO2:  92% 92% 94%  Weight: 121.9 kg     Height:        Intake/Output Summary (Last 24 hours) at 11/13/2020 0826 Last data filed at 11/13/2020 0600 Gross per 24 hour  Intake 1031.51 ml  Output 1295 ml  Net -263.49 ml   Last 3 Weights 11/13/2020 11/12/2020 11/11/2020  Weight (lbs) 268 lb 11.9 oz 271 lb 13.2 oz 264 lb 5.3 oz  Weight (kg) 121.9 kg 123.3 kg 119.9 kg      Telemetry    Essentially sinus rhythm with overnight rates down into the high 40s with mostly 60s and 70s.  Rare PVCs.  Significant artifact.- Personally Reviewed  ECG    NSR 65.  Incomplete LBBB.  Lateral ST-T wave changes (stable) .- Personally Reviewed  Physical Exam   GEN: Morbidly obese gentleman - sitting up in bed - NAD.  Looks well.  Neck: Unable to assess 2/2 body habitus Cardiac: Nl S1 & S2; RRR. Soft SEM & S4 with no Rubs.  Respiratory: CTAB, non-labored with good air movement. GI: Soft/NT/ND/NABS.  No exam.  Obese. MS: No C/C/E.  Right radial cath site with mild bruising.  Right femoral arterial access site with no significant hematoma.  Unable to assess for bruit. Neuro: Nonfocal Psych: Normal mood and affect.  Cardiac Studies   Lexiscan MPI 05/2020: Large defect of severe severity present in the basal inferior, basal inferior lateral, mid inferior, and mid  inferolateral location.  Findings were consistent with prior MI with moderate peri-infarct ischemia.  EF was 45%.  Overall, this was an intermediate risk study. 2D Echo 11/08/2020: (Difficult study) EF 50-55% (previously 60 to 65% in March 2020 with no R WMA.).  Low normal function.  Apical HK.  GRII relatively stable. R&LHC 11/11/2020 (Dr. Saunders Revel): See above.  RA (mean): 10 mmHg; RV (S/EDP): 50/11 mmHg; PA (S/D, mean): 50/20 mmHg; PCWP (mean): 22 mmHg; ao sat 96%, PA sat 80%.  Fick Cardiac Output-Index: 8.0 - 3.4 -> IABP placed Staged DES PCI 8/31/20222 (Dr.Arida):  Prox Graft lesion between 2nd Diag and Mid LAD  is 90% stenosed - DES PCI STENT ONYX FRONTIER 4.0X15; Insertion lesion before 2nd Diag  is 90% stenosed. (Seq SVG-D2-mLAD) - DES PCI  STENT ONYX FRONTIER 4.0X15 -      Labs    High Sensitivity Troponin:  Recent Labs  Lab 11/08/20 0418 11/08/20 0616 11/10/20 0917  TROPONINIHS 376* 455* 1,117*      Chemistry Recent Labs  Lab 11/08/20 0418 11/09/20 0436 11/11/20 0104 11/12/20 0134 11/13/20 0106  NA 139   < > 138 138 138  K 4.7   < > 4.0 4.4 4.5  CL 103   < > 98 102 102  CO2 32   < > '31 29 30  '$ GLUCOSE 0000000*   < > 142* 137* 152*  BUN 27*   < > 33* 23 22  CREATININE 1.73*   < > 1.83* 1.77* 1.89*  CALCIUM 9.3   < > 8.6* 8.9 8.4*  PROT 7.2  --   --   --   --   ALBUMIN 3.7  --   --   --   --   AST 16  --   --   --   --   ALT 11  --   --   --   --   ALKPHOS 50  --   --   --   --   BILITOT 0.7  --   --   --   --   GFRNONAA 41*   < > 38* 40* 37*  ANIONGAP 4*   < > '9 7 6   '$ < > = values in this interval not displayed.     Hematology Recent Labs  Lab 11/11/20 0103 11/12/20 0134 11/13/20 0106  WBC 8.8 8.8 10.1  RBC 4.88 4.61 4.39  HGB 14.4 13.9 13.0  HCT 44.8 43.4 41.6  MCV 91.8 94.1 94.8  MCH 29.5 30.2 29.6  MCHC 32.1 32.0 31.3  RDW 15.0 15.2 15.1  PLT 171 151 137*    BNP Recent Labs  Lab 11/08/20 0558  BNP 138.7*     DDimer No results for input(s): DDIMER  in the last 168 hours.   Radiology    CARDIAC CATHETERIZATION  Result Date: 11/12/2020   Dist LM lesion is 90% stenosed.   Mid LAD lesion is 100% stenosed.   Ost Cx to Prox Cx lesion is 100% stenosed.   Dist Graft lesion before 2nd Diag  is 99% stenosed.   Prox Graft lesion between 2nd Diag and Mid LAD  is 90% stenosed.   1st Diag lesion is 100% stenosed.   Insertion lesion before 2nd Diag  is 90% stenosed.   A drug-eluting stent was successfully placed using a STENT ONYX FRONTIER 4.0X15.   A drug-eluting stent was successfully placed using a STENT ONYX FRONTIER 4.0X15.   Post intervention, there is a 0% residual stenosis.   Post intervention, there is a 0% residual stenosis.   SVG and is large. Successful drug-eluting stent placement to the mid SVG to LAD/diagonal using distal protection device. Successful drug-eluting stent placement to the distal SVG to LAD/diagonal without distal protection device due to distal location. Recommendations: Dual antiplatelet therapy indefinitely. Aggressive treatment of risk factors. Recommend removing intra-aortic balloon pump at 8:30 PM to allow washout of heparin.   CARDIAC CATHETERIZATION  Result Date: 11/11/2020 Conclusions: Severe native coronary artery disease including 90% distal LMCA stenosis, 100% mid LAD occlusion, 100% ostial LCx occlusion, and severe diffuse RCA disease. Widely patent LIMA-D1. Patent sequential SVG-D2-LAD with 99% mid graft stenosis as well as 90% stenosis at the LIMA-D2 anastomosis. Chronically occluded, previously stented SVG-OM. Mildly elevated left and right heart filling pressures. Moderate pulmonary hypertension. Normal-supranormal Fick cardiac output/index. Successful placement of 50 mL intra-aortic balloon pump via  the right common femoral artery. Recommendations: Images reviewed with Dr. Fletcher Anon.  We agree that patient is high risk for revascularization given his severe native and graft disease.  His distal targets are small and  diffusely diseased, making them suboptimal for redo-CABG.  We will transfer to [facility] for possible high risk PCI to SVG-D2-LAD tomorrow. Titrate IV nitroglycerin for relief of chest pain. Aggressive secondary prevention. Nelva Bush, MD [facility]  DG CHEST PORT 1 VIEW  Result Date: 11/11/2020 CLINICAL DATA:  Intra-aortic balloon pump EXAM: PORTABLE CHEST 1 VIEW COMPARISON:  11/08/2020 FINDINGS: Post sternotomy changes with redemonstrated fractures through multiple sternal wires. Cardiomegaly with vascular congestion and mild pulmonary edema. Small right-sided pleural effusion. Interim placement of intra-aortic balloon pump, slightly low positioning of radiopaque tip which is visible about 6 cm inferior to the aortic arch. No pneumothorax is seen IMPRESSION: 1. Low positioning of intra-aortic balloon pump with radiopaque tip about 6 cm inferior to the aortic arch 2. Cardiomegaly with vascular congestion, pulmonary edema and small right pleural effusion These results will be called to the ordering clinician or representative by the Radiologist Assistant, and communication documented in the PACS or [employer]. Electronically Signed   By: Donavan Foil M.D.   On: 11/11/2020 22:22       For questions or updates, please contact [place] Please consult [email or web address] for contact info under        Signed, Glenetta Hew, MD  11/13/2020, 8:26 AM

## 2020-11-14 ENCOUNTER — Other Ambulatory Visit (HOSPITAL_COMMUNITY): Payer: Self-pay

## 2020-11-14 LAB — BASIC METABOLIC PANEL
Anion gap: 4 — ABNORMAL LOW (ref 5–15)
BUN: 23 mg/dL (ref 8–23)
CO2: 31 mmol/L (ref 22–32)
Calcium: 8.7 mg/dL — ABNORMAL LOW (ref 8.9–10.3)
Chloride: 102 mmol/L (ref 98–111)
Creatinine, Ser: 1.95 mg/dL — ABNORMAL HIGH (ref 0.61–1.24)
GFR, Estimated: 35 mL/min — ABNORMAL LOW (ref >60)
Glucose, Bld: 161 mg/dL — ABNORMAL HIGH (ref 70–99)
Potassium: 4.5 mmol/L (ref 3.5–5.1)
Sodium: 137 mmol/L (ref 135–145)

## 2020-11-14 LAB — GLUCOSE, CAPILLARY
Glucose-Capillary: 150 mg/dL — ABNORMAL HIGH (ref 70–99)
Glucose-Capillary: 181 mg/dL — ABNORMAL HIGH (ref 70–99)
Glucose-Capillary: 126 mg/dL — ABNORMAL HIGH (ref 70–99)

## 2020-11-14 MED ORDER — TICAGRELOR 90 MG PO TABS: 90.0000 mg | ORAL_TABLET | Freq: Two times a day (BID) | ORAL | Status: DC

## 2020-11-14 MED ORDER — LISINOPRIL 5 MG PO TABS: 2.5000 mg | ORAL_TABLET | Freq: Every day | ORAL | Status: DC

## 2020-11-14 MED ORDER — TICAGRELOR 90 MG PO TABS: 180.0000 mg | ORAL_TABLET | Freq: Once | ORAL | Status: DC

## 2020-11-14 MED ORDER — TICAGRELOR 90 MG PO TABS
90.0000 mg | ORAL_TABLET | Freq: Two times a day (BID) | ORAL | 11 refills | Status: DC
  Filled 2020-11-14: qty 60, 30d supply, fill #0

## 2020-11-14 MED ORDER — NITROGLYCERIN 0.4 MG SL SUBL
0.4000 mg | SUBLINGUAL_TABLET | SUBLINGUAL | 1 refills | Status: DC | PRN
  Filled 2020-11-14: qty 25, 7d supply, fill #0

## 2020-11-14 NOTE — Discharge Summary (Addendum)
Discharge Summary    Patient ID: Vonn Sliger MRN: [id number]; DOB: Feb 10, 1946  Admit date: 11/11/2020 Discharge date: 11/18/2020  PCP:  Virginia Crews, MD   [facility] Providers Cardiologist:  Kate Sable, MD   Discharge Diagnoses    Principal Problem:   NSTEMI (non-ST elevated myocardial infarction) [facility]) Active Problems:   Chronic obstructive pulmonary disease ([place])   Controlled type 2 diabetes mellitus with diabetic polyneuropathy, without long-term current use of insulin ([place])   Coronary artery disease with hx of myocardial infarct w/o hx of CABG   Morbid obesity ([place])   Hypertension associated with diabetes ([place])   Hyperlipidemia associated with type 2 diabetes mellitus ([place])   Acute on chronic diastolic CHF (congestive heart failure) ([place])  Acute on Chronic CKD-3b    Diagnostic Studies/Procedures    Coronary stent intervention 11/12/20:   Dist LM lesion is 90% stenosed.   Mid LAD lesion is 100% stenosed.   Ost Cx to Prox Cx lesion is 100% stenosed.   Dist Graft lesion before 2nd Diag  is 99% stenosed.   Prox Graft lesion between 2nd Diag and Mid LAD  is 90% stenosed.   1st Diag lesion is 100% stenosed.   Insertion lesion before 2nd Diag  is 90% stenosed.   A drug-eluting stent was successfully placed using a STENT ONYX FRONTIER 4.0X15.   A drug-eluting stent was successfully placed using a STENT ONYX FRONTIER 4.0X15.   Post intervention, there is a 0% residual stenosis.   Post intervention, there is a 0% residual stenosis.   SVG and is large.   Successful drug-eluting stent placement to the mid SVG to LAD/diagonal using distal protection device. Successful drug-eluting stent placement to the distal SVG to LAD/diagonal without distal protection device due to distal location.   Recommendations: Dual antiplatelet therapy indefinitely. Aggressive treatment of risk factors. Recommend removing intra-aortic balloon pump at 8:30 PM to allow washout  of heparin. _____________  Right and left heart cath 11/11/20: Conclusions: Severe native coronary artery disease including 90% distal LMCA stenosis, 100% mid LAD occlusion, 100% ostial LCx occlusion, and severe diffuse RCA disease. Widely patent LIMA-D1. Patent sequential SVG-D2-LAD with 99% mid graft stenosis as well as 90% stenosis at the LIMA-D2 anastomosis. Chronically occluded, previously stented SVG-OM. Mildly elevated left and right heart filling pressures. Moderate pulmonary hypertension. Normal-supranormal Fick cardiac output/index. Successful placement of 50 mL intra-aortic balloon pump via the right common femoral artery.   Recommendations: Images reviewed with Dr. Fletcher Anon.  We agree that patient is high risk for revascularization given his severe native and graft disease.  His distal targets are small and diffusely diseased, making them suboptimal for redo-CABG.  We will transfer to [facility] for possible high risk PCI to SVG-D2-LAD tomorrow. Titrate IV nitroglycerin for relief of chest pain. Aggressive secondary prevention.   _____________  Echo 11/08/20:  1. Left ventricular ejection fraction, by estimation, is 50 to 55%. The  left ventricle has low normal function. The left ventricle demonstrates  regional wall motion abnormalities (apical hypokinesis). The left  ventricular internal cavity size was mildly   dilated. Left ventricular diastolic parameters are consistent with Grade  II diastolic dysfunction (pseudonormalization).   2. Right ventricular systolic function was not well visualized. The right  ventricular size is not well visualized.   3. Left atrial size was moderately dilated.   4. Challenging images    History of Present Illness     Ansar Skoda is a 75 y.o. male with CAD status post  four-vessel CABG in 2007 with NSTEMI in 2018 status PCI x2, DM2, CKD stage III, HTN, HLD with statin intolerance, possible COPD with prior tobacco use quitting greater  than 20 years ago, morbid obesity, and OSA not on CPAP who is being seen 11/11/2020 for the evaluation of NSTEMI.  Mr. Rains was previously followed by [facility].  While in [place], he underwent four-vessel CABG in 2007. He was admitted with an NSTEMI in 2018 and had 2 stents placed.  Upon moving to the [place] area in 08/2019, he established with Dr. Garen Lah.  Echo in 05/2020 showed an EF of 60 to 65%, no regional wall motion abnormalities, indeterminate LV diastolic function parameters, low normal RV systolic function, and no significant valvular abnormalities.  Lexiscan MPI showed a large defect of severe severity present in the basal inferior, basal inferior lateral, mid inferior, and mid inferolateral location.  Findings were consistent with prior MI with moderate peri-infarct ischemia.  EF was 45%.  Overall, this was an intermediate risk study.   He was admitted to [facility] on 8/26 with intermittent angina that was occurring with exertion and at rest.  CTA of the chest in the ED was negative for PE with right lung volume loss with lower lobe fibrothorax noted.  He ruled in for an NSTEMI with troponin trending to 1117.  He was placed on a heparin drip.  Echo on 8/27 showed an EF of 50 to 55%, apical hypokinesis, mildly dilated LV internal cavity size, grade 2 diastolic dysfunction, moderately dilated left atrium, and had overall challenging images.  He was IV diuresed with noted AKI with serum creatinine peaking at 2.03 on 8/29.  In this setting, IV Lasix was discontinued and he was gently hydrated with normal saline.  With this, renal function improved.  He continued to note intermittent angina that was nitro responsive.  He subsequently underwent LHC on 8/30 which demonstrated critical native coronary and bypass graft disease requiring IABP placement and recommendation for the patient to be transferred to [facility] for high risk PCI, scheduled for 8/31.  Hospital Course      Consultants: none  CAD s/p CABG with subsequent PCI to SVG-marginal NSTEMI Chest pain He presented to [facility] with NSTEMI and acute on chronic diastolic heart failure. He was diuresed prior to  heart cath which resulted in mild acute on chronic renal insufficiency treated with gentle hydration. Heart cath by Dr. Saunders Revel at [facility] revealed known severe native vessel CAD  and severe graft/anastamosis disease. Due to severity of disease and ongoing symptoms, he underwent IABP placement and transferred to [facility]. He was taken back to the cath lab at [facility] on 11/12/20. Two separate lesions in the SVG-LAD/diagonal treated with DES x 2. He tolerated the procedure well. IABP removed on 11/12/20 after heparin washout. He was kept yesterday for marginal pressures.   He was switched to brilinta, but will likely be unable to afford after 30 days. If unaffordable, will need to switch back to plavix with a 300 mg load, per Dr. Ellyn Hack.    Bradycardia with 2:1 AV block BB was discontinued. Attempted to restart in patient, but he exhibited bradycardia and increased fatigue and exercise intolerance. BB was again discontinued.    Chronic diastolic heart failure Hypertension Echo here with preserved EF. LVEDP roughly 10 during cath. Restarted low dose ACEI at 2.5 mg daily. Resume home amlodipine.    Hyperlipidemia with LDL goal < 70 Statin intolerance, on PCSK9i He is in "the donut hole"  and will likely not be able to afford vascepa. Will refer to lipid clinic given his progression of disease. 11/09/2020: Cholesterol 123; HDL 32; LDL Cholesterol 47; Triglycerides 219; VLDL 44 Continue home statin and PCSK9i.    Acute on chronic kidney disease stage IIIb Renal function stable. Recheck BMP at follow up.    DM2 COPD Defer to PCP   Obesity with OSA Not currently on CPAP. ?Needs sleep study.   Pt seen and examined by Dr. Ellyn Hack and deemed stable for discharge. Follow up has been arranged.     Did the patient  have an acute coronary syndrome (MI, NSTEMI, STEMI, etc) this admission?:  Yes                               AHA/ACC Clinical Performance & Quality Measures: Aspirin prescribed? - Yes ADP Receptor Inhibitor (Plavix/Clopidogrel, Brilinta/Ticagrelor or Effient/Prasugrel) prescribed (includes medically managed patients)? - Yes Beta Blocker prescribed? - No - bradycardia High Intensity Statin (Lipitor 40-86m or Crestor 20-452m prescribed? - No - intolerance, on PCSK9i EF assessed during THIS hospitalization? - Yes For EF <40%, was ACEI/ARB prescribed? - Yes For EF <40%, Aldosterone Antagonist (Spironolactone or Eplerenone) prescribed? - Not Applicable (EF >/= 4029%Cardiac Rehab Phase II ordered (including medically managed patients)? - Yes      _____________  Discharge Vitals Blood pressure (!) 123/53, pulse 82, temperature 98.7 F (37.1 C), temperature source Oral, resp. rate (!) 21, height _0  (1.753 m), weight 122.9 kg, SpO2 95 %.  Filed Weights   11/12/20 0411 11/13/20 0500 11/14/20 0500  Weight: 123.3 kg 121.9 kg 122.9 kg    Labs & Radiologic Studies    CBC Recent Labs    11/15/20 2240  WBC 10.8*  HGB 13.6  HCT 42.5  MCV 92.6  PLT 16191 Basic Metabolic Panel Recent Labs    11/15/20 2240  NA 138  K 3.7  CL 102  CO2 23  GLUCOSE 189*  BUN 23  CREATININE 1.98*  CALCIUM 9.5   Liver Function Tests Recent Labs    11/15/20 2240  AST 21  ALT 18  ALKPHOS 41  BILITOT 0.9  PROT 6.6  ALBUMIN 3.1*   No results for input(s): LIPASE, AMYLASE in the last 72 hours. High Sensitivity Troponin:   Recent Labs  Lab 11/10/20 0917 11/15/20 1618 11/15/20 1819 11/15/20 2240 11/16/20 0221  TROPONINIHS 1,117* 539* 625* 366* 353*    BNP Invalid input(s): POCBNP D-Dimer No results for input(s): DDIMER in the last 72 hours. Hemoglobin A1C No results for input(s): HGBA1C in the last 72 hours. Fasting Lipid Panel No results for input(s): CHOL, HDL, LDLCALC, TRIG,  CHOLHDL, LDLDIRECT in the last 72 hours. Thyroid Function Tests No results for input(s): TSH, T4TOTAL, T3FREE, THYROIDAB in the last 72 hours.  Invalid input(s): FREET3 _____________  DG Chest 2 View  Result Date: 11/15/2020 CLINICAL DATA:  Shortness of breath.  Hospital discharge yesterday. EXAM: CHEST - 2 VIEW COMPARISON:  Radiograph 11/11/2020, CT 11/08/2020 FINDINGS: Stable cardiomediastinal contours post CABG. Broken sternal wires again seen. Right pleural effusions/pleural thickening with streaky opacities at the right lung base, similar. There is no pulmonary edema or pneumothorax. No acute airspace disease. Stable compression fracture in the lower thoracic spine. IMPRESSION: 1. No acute chest finding. 2. Stable right pleural effusion/pleural thickening and streaky basilar opacity. Electronically Signed   By: MeKeith Rake.D.   On: 11/15/2020 16:54  CT Angio Chest PE W and/or Wo Contrast  Result Date: 11/08/2020 CLINICAL DATA:  75 year old male with central chest pain radiating to the back and left shoulder blade. EXAM: CT ANGIOGRAPHY CHEST WITH CONTRAST TECHNIQUE: Multidetector CT imaging of the chest was performed using the standard protocol during bolus administration of intravenous contrast. Multiplanar CT image reconstructions and MIPs were obtained to evaluate the vascular anatomy. CONTRAST:  185m OMNIPAQUE IOHEXOL 350 MG/ML SOLN COMPARISON:  Portable chest 0442 hours today. FINDINGS: Cardiovascular: Good contrast bolus timing in the pulmonary arterial tree. No focal filling defect identified in the pulmonary arteries to suggest acute pulmonary embolism. Prior sternotomy. Calcified aortic atherosclerosis. Cardiomegaly. No pericardial effusion. Mediastinum/Nodes: Postoperative changes with no mediastinal mass or lymphadenopathy. Maximal and mildly asymmetric right hilar lymph nodes. Lungs/Pleura: Volume loss in the right hemithorax with posterior and costophrenic angle pleural  thickening and/or intermediate density small pleural collection (series 4, image 84) with faint calcification. Associated right lower lobe basal segment atelectasis and mild architectural distortion. Major airways remain patent with atelectatic changes. Scattered subpleural scarring in both lungs. No left pleural effusion. Upper Abdomen: Absent gallbladder. Negative visible noncontrast liver, spleen, pancreas, adrenal glands and bowel in the upper abdomen. Partially visible atrophied left kidney. Musculoskeletal: Chronic but ununited sternotomy. T12 wedge compression fracture with largely obliterated T11-T12 disc space, but the T11 inferior endplate remains intact. Interbody ankylosis there and at other thoracic levels in part related to flowing endplate osteophytes. No acute or suspicious osseous lesion identified. Review of the MIP images confirms the above findings. IMPRESSION: 1. Negative for acute pulmonary embolus. 2. Right lung volume loss with lower lobe fibrothorax, or less likely small pleural empyema. Associated right lung atelectasis and architectural distortion. Mild atelectasis and pulmonary scarring elsewhere. 3. Cardiomegaly.  Aortic Atherosclerosis (ICD10-I70.0). 4. Chronic T12 compression fracture. Electronically Signed   By: HGenevie AnnM.D.   On: 11/08/2020 07:30   CARDIAC CATHETERIZATION  Result Date: 11/12/2020   Dist LM lesion is 90% stenosed.   Mid LAD lesion is 100% stenosed.   Ost Cx to Prox Cx lesion is 100% stenosed.   Dist Graft lesion before 2nd Diag  is 99% stenosed.   Prox Graft lesion between 2nd Diag and Mid LAD  is 90% stenosed.   1st Diag lesion is 100% stenosed.   Insertion lesion before 2nd Diag  is 90% stenosed.   A drug-eluting stent was successfully placed using a STENT ONYX FRONTIER 4.0X15.   A drug-eluting stent was successfully placed using a STENT ONYX FRONTIER 4.0X15.   Post intervention, there is a 0% residual stenosis.   Post intervention, there is a 0% residual  stenosis.   SVG and is large. Successful drug-eluting stent placement to the mid SVG to LAD/diagonal using distal protection device. Successful drug-eluting stent placement to the distal SVG to LAD/diagonal without distal protection device due to distal location. Recommendations: Dual antiplatelet therapy indefinitely. Aggressive treatment of risk factors. Recommend removing intra-aortic balloon pump at 8:30 PM to allow washout of heparin.   CARDIAC CATHETERIZATION  Result Date: 11/11/2020 Conclusions: Severe native coronary artery disease including 90% distal LMCA stenosis, 100% mid LAD occlusion, 100% ostial LCx occlusion, and severe diffuse RCA disease. Widely patent LIMA-D1. Patent sequential SVG-D2-LAD with 99% mid graft stenosis as well as 90% stenosis at the LIMA-D2 anastomosis. Chronically occluded, previously stented SVG-OM. Mildly elevated left and right heart filling pressures. Moderate pulmonary hypertension. Normal-supranormal Fick cardiac output/index. Successful placement of 50 mL intra-aortic balloon pump via the right common  femoral artery. Recommendations: Images reviewed with Dr. Fletcher Anon.  We agree that patient is high risk for revascularization given his severe native and graft disease.  His distal targets are small and diffusely diseased, making them suboptimal for redo-CABG.  We will transfer to [facility] for possible high risk PCI to SVG-D2-LAD tomorrow. Titrate IV nitroglycerin for relief of chest pain. Aggressive secondary prevention. Nelva Bush, MD [facility]  DG CHEST PORT 1 VIEW  Result Date: 11/11/2020 CLINICAL DATA:  Intra-aortic balloon pump EXAM: PORTABLE CHEST 1 VIEW COMPARISON:  11/08/2020 FINDINGS: Post sternotomy changes with redemonstrated fractures through multiple sternal wires. Cardiomegaly with vascular congestion and mild pulmonary edema. Small right-sided pleural effusion. Interim placement of intra-aortic balloon pump, slightly low positioning of radiopaque  tip which is visible about 6 cm inferior to the aortic arch. No pneumothorax is seen IMPRESSION: 1. Low positioning of intra-aortic balloon pump with radiopaque tip about 6 cm inferior to the aortic arch 2. Cardiomegaly with vascular congestion, pulmonary edema and small right pleural effusion These results will be called to the ordering clinician or representative by the Radiologist Assistant, and communication documented in the PACS or [employer]. Electronically Signed   By: Donavan Foil M.D.   On: 11/11/2020 22:22   DG Chest Port 1 View  Result Date: 11/08/2020 CLINICAL DATA:  Chest pain and hypoxemia EXAM: PORTABLE CHEST 1 VIEW COMPARISON:  None. FINDINGS: Previous median sternotomy for CABG procedure. Mild cardiac enlargement. Mild diffuse interstitial edema identified. No airspace opacities. Small right pleural effusion suspected. IMPRESSION: Mild congestive heart failure. Electronically Signed   By: Kerby Moors M.D.   On: 11/08/2020 05:23   ECHOCARDIOGRAM COMPLETE  Result Date: 11/09/2020    ECHOCARDIOGRAM REPORT   Patient Name:   GUNNARD DORRANCE Date of Exam: 11/08/2020 Medical Rec #:  [id number]        Height:       69.0 in Accession #:    [id number]       Weight:       271.1 lb Date of Birth:  09-07-1945        BSA:          2.351 m Patient Age:    75 years         BP:           128/57 mmHg Patient Gender: M                HR:           53 bpm. Exam Location:  [facility] Procedure: 2D Echo and Intracardiac Opacification Agent Indications:     NSTEMI I21.4  History:         Patient has prior history of Echocardiogram examinations, most                  recent 05/22/2020.  Sonographer:     Kathlen Brunswick RDCS Referring Phys:  [id number] [place] Diagnosing Phys: Ida Rogue MD  Sonographer Comments: Technically difficult study due to poor echo windows, suboptimal subcostal window, suboptimal apical window and patient is morbidly obese. IMPRESSIONS  1. Left ventricular ejection fraction, by  estimation, is 50 to 55%. The left ventricle has low normal function. The left ventricle demonstrates regional wall motion abnormalities (apical hypokinesis). The left ventricular internal cavity size was mildly  dilated. Left ventricular diastolic parameters are consistent with Grade II diastolic dysfunction (pseudonormalization).  2. Right ventricular systolic function was not well visualized. The right ventricular size is not well  visualized.  3. Left atrial size was moderately dilated.  4. Challenging images FINDINGS  Left Ventricle: Left ventricular ejection fraction, by estimation, is 50 to 55%. The left ventricle has low normal function. The left ventricle demonstrates regional wall motion abnormalities. The left ventricular internal cavity size was mildly dilated. There is no left ventricular hypertrophy. Left ventricular diastolic parameters are consistent with Grade II diastolic dysfunction (pseudonormalization). Right Ventricle: The right ventricular size is not well visualized. Right vetricular wall thickness was not well visualized. Right ventricular systolic function was not well visualized. Left Atrium: Left atrial size was moderately dilated. Right Atrium: Right atrial size was normal in size. Pericardium: There is no evidence of pericardial effusion. Mitral Valve: The mitral valve was not well visualized. No evidence of mitral valve regurgitation. No evidence of mitral valve stenosis. Tricuspid Valve: The tricuspid valve is not well visualized. Tricuspid valve regurgitation is not demonstrated. No evidence of tricuspid stenosis. Aortic Valve: The aortic valve was not well visualized. Aortic valve regurgitation is mild. No aortic stenosis is present. Aortic valve peak gradient measures 4.8 mmHg. Pulmonic Valve: The pulmonic valve was not well visualized. Pulmonic valve regurgitation is not visualized. No evidence of pulmonic stenosis. Aorta: The aortic arch was not well visualized and the ascending  aorta was not well visualized. Venous: The pulmonary veins were not well visualized. The inferior vena cava was not well visualized. The inferior vena cava is normal in size with greater than 50% respiratory variability, suggesting right atrial pressure of 3 mmHg. IAS/Shunts: No atrial level shunt detected by color flow Doppler.  LEFT VENTRICLE PLAX 2D LVIDd:         6.20 cm  Diastology LVIDs:         5.20 cm  LV e' medial:  5.87 cm/s LV PW:         1.10 cm  LV e' lateral: 7.18 cm/s LV IVS:        0.80 cm LVOT diam:     2.10 cm LV SV:         74 LV SV Index:   32 LVOT Area:     3.46 cm  LEFT ATRIUM             Index LA diam:        6.00 cm 2.55 cm/m LA Vol (A2C):   40.1 ml 17.06 ml/m LA Vol (A4C):   43.1 ml 18.33 ml/m LA Biplane Vol: 42.4 ml 18.03 ml/m  AORTIC VALVE AV Area (Vmax): 3.37 cm AV Vmax:        109.00 cm/s AV Peak Grad:   4.8 mmHg LVOT Vmax:      106.00 cm/s LVOT Vmean:     61.200 cm/s LVOT VTI:       0.215 m  AORTA Ao Root diam: 3.40 cm  SHUNTS Systemic VTI:  0.22 m Systemic Diam: 2.10 cm Ida Rogue MD Electronically signed by Ida Rogue MD Signature Date/Time: 11/09/2020/10:33:40 AM    Final    ECHOCARDIOGRAM LIMITED  Result Date: 11/16/2020    ECHOCARDIOGRAM LIMITED REPORT   Patient Name:   NERO SAWATZKY Date of Exam: 11/16/2020 Medical Rec #:  [id number]        Height:       69.0 in Accession #:    [id number]       Weight:       267.9 lb Date of Birth:  [birth year]        BSA:  2.339 m Patient Age:    38 years         BP:           157/61 mmHg Patient Gender: M                HR:           60 bpm. Exam Location:  Inpatient Procedure: 2D Echo, Limited Echo and Intracardiac Opacification Agent Indications:    R06.02 SOB  History:        Patient has prior history of Echocardiogram examinations, most                 recent 11/08/2020. Previous Myocardial Infarction, Prior CABG;                 Signs/Symptoms:Shortness of Breath.  Sonographer:    Merrie Roof RDCS Referring Phys: [place]  1. Left ventricular ejection fraction, by estimation, is 60 to 65%. The left ventricle has normal function. The left ventricle has no regional wall motion abnormalities. Left ventricular diastolic function could not be evaluated.  2. The right ventricular size is normal. Comparison(s): A prior study was performed on 11/08/2020. Prior images reviewed side by side. Limited study for left ventricular function. The apical wall motion abnormality has resolved and the overall left ventricular function has improved. FINDINGS  Left Ventricle: Left ventricular ejection fraction, by estimation, is 60 to 65%. The left ventricle has normal function. The left ventricle has no regional wall motion abnormalities. Definity contrast agent was given IV to delineate the left ventricular  endocardial borders. Left ventricular diastolic function could not be evaluated. Right Ventricle: The right ventricular size is normal. Pericardium: There is no evidence of pericardial effusion. Sanda Klein MD Electronically signed by Sanda Klein MD Signature Date/Time: 11/16/2020/2:54:06 PM    Final    Disposition   Pt is being discharged home today in good condition.  Follow-up Plans & Appointments     Follow-up Information     Kate Sable, MD Follow up on 11/20/2020.   Specialties: Cardiology, Radiology Why: 11:20 am for hospital follow up Contact information: [place]  [id number] [phone]                Discharge Instructions     AMB Referral to Advanced Lipid Disorders Clinic   Complete by: As directed    Internal Lipid Clinic Referral Scheduling  Internal lipid clinic referrals are providers within [facility], who wish to refer established patients for routine management (help in starting PCSK9 inhibitor therapy) or advanced therapies.  Internal MD referral criteria:              1. All patients with LDL>190 mg/dL  2. All patients with Triglycerides >500  mg/dL  3. Patients with suspected or confirmed heterozygous familial hyperlipidemia (HeFH) or homozygous familial hyperlipidemia (HoFH)  4. Patients with family history of suspicious for genetic dyslipidemia desiring genetic testing  5. Patients refractory to standard guideline based therapy  6. Patients with statin intolerance (failed 2 statins, one of which must be a high potency statin)  7. Patients who the provider desires to be seen by MD   Internal PharmD referral criteria:   1. Follow-up patients for medication management  2. Follow-up for compliance monitoring  3. Patients for drug education  4. Patients with statin intolerance  5. PCSK9 inhibitor education and prior authorization approvals  6. Patients with triglycerides <500 mg/dL  External Lipid Clinic Referral  External lipid clinic referrals are for providers outside of [facility], considered new clinic patients - automatically routed to MD schedule   Amb Referral to Cardiac Rehabilitation   Complete by: As directed    Diagnosis:  Coronary Stents NSTEMI     After initial evaluation and assessments completed: Virtual Based Care may be provided alone or in conjunction with Phase 2 Cardiac Rehab based on patient barriers.: Yes   Diet - low sodium heart healthy   Complete by: As directed    Discharge instructions   Complete by: As directed    No driving for 1 week. No lifting over 5 lbs for 1 week. No sexual activity for 1 week. Keep procedure site clean & dry. If you notice increased pain, swelling, bleeding or pus, call/return!  You may shower, but no soaking baths/hot tubs/pools for 1 week.   Increase activity slowly   Complete by: As directed        Discharge Medications   Allergies as of 11/14/2020       Reactions   Butorphanol Itching   Morphine Other (See Comments)   Redness around IV site   Simvastatin Other (See Comments)   "Destroyed my muscles"   Statins Other (See Comments)   "Destroyed my  muscles"        Medication List     STOP taking these medications    clopidogrel 75 MG tablet Commonly known as: PLAVIX   metoprolol tartrate 25 MG tablet Commonly known as: LOPRESSOR       TAKE these medications    Advair HFA 230-21 MCG/ACT inhaler Generic drug: fluticasone-salmeterol Inhale 1-2 puffs into the lungs 2 (two) times daily. Rinse mouth after each use.   albuterol 108 (90 Base) MCG/ACT inhaler Commonly known as: VENTOLIN HFA Inhale 2 puffs into the lungs every 6 (six) hours as needed for wheezing or shortness of breath.   allopurinol 300 MG tablet Commonly known as: ZYLOPRIM Take 1 tablet (300 mg total) by mouth daily.   amLODipine 10 MG tablet Commonly known as: NORVASC Take 1 tablet (10 mg total) by mouth daily.   aspirin EC 81 MG tablet Take 81 mg by mouth daily. Swallow whole.   blood glucose meter kit and supplies Dispense based on patient and insurance preference. Use up to four times daily as directed. (FOR ICD-10 E10.9, E11.9). check blood glucose fasting and before meals four times daily.   Brilinta 90 MG Tabs tablet Generic drug: ticagrelor Take 1 tablet (90 mg total) by mouth 2 (two) times daily.   Co Q-10 400 MG Caps Take by mouth daily at 6 (six) AM.   desoximetasone 0.25 % cream Commonly known as: TOPICORT Apply 1 application topically 2 (two) times daily.   escitalopram 20 MG tablet Commonly known as: LEXAPRO Take 1 tablet (20 mg total) by mouth daily.   Fish Oil 1200 MG Caps Take by mouth daily at 6 (six) AM.   gabapentin 300 MG capsule Commonly known as: NEURONTIN Take 2 capsules (600 mg total) by mouth at bedtime.   lisinopril 5 MG tablet Commonly known as: ZESTRIL Take 0.5 tablets (2.5 mg total) by mouth daily. What changed: how much to take   lovastatin 40 MG tablet Commonly known as: MEVACOR Take 40 mg by mouth at bedtime.   nitroGLYCERIN 0.4 MG SL tablet Commonly known as: NITROSTAT Place 1 tablet (0.4 mg  total) under the tongue every 5 (five) minutes x 3 doses as needed for chest pain.  Repatha SureClick 025 MG/ML Soaj Generic drug: Evolocumab Inject 1 pen into the skin every 14 (fourteen) days.   traZODone 150 MG tablet Commonly known as: DESYREL Take 1-2 tablets (150-300 mg total) by mouth at bedtime as needed for sleep.   VITAMIN D3 PO Take 4,000 Units by mouth.           Outstanding Labs/Studies   BMP  ?sleep study?  Lipid clinic referral  Duration of Discharge Encounter   Greater than 30 minutes including physician time.  Signed, Tami Lin Duke, PA 11/14/2020, 11:57 AM

## 2020-11-14 NOTE — Progress Notes (Signed)
CARDIAC REHAB PHASE I   PRE:  Rate/Rhythm: 72 SR  BP:  Sitting: 124/74      SaO2: 94 3L --> 92 RA  MODE:  Ambulation: 190 ft   POST:  Rate/Rhythm: 95 SR  BP:  Sitting: 111/48    SaO2: 91 RA --> 88 RA --> 94 2L   Pt agreeable to ambulate. Pt able to walk 146f in hallway standby assist with steady gait. Pt denies CP, SOB, or dizziness, does endorse fatigue and weakness. Pt helped back into bed. Reviewed education. Pt denies further questions or concerns at this time. Hopeful for d/c today. Referred to [facility] [place].  [id number] Falco RN BSN 11/14/2020 8:42 AM

## 2020-11-14 NOTE — TOC Benefit Eligibility Note (Addendum)
Patient Teacher, English as a foreign language completed.    The patient is currently admitted and upon discharge could be taking Brilinta 90 mg.  The current 30 day co-pay is, $107.26 due to being the Coverage Gap (donut hole).   The patient is currently admitted and upon discharge could be taking Vascepa 1g.  The current 30 day co-pay is, $89.43 due to being the Coverage Gap (donut hole).   The patient is insured through [facility] of [place]    Tony Williams, [place] Patient Advocate Specialist [place] Team Direct Number: [phone]  Fax: [phone]

## 2020-11-14 NOTE — Progress Notes (Addendum)
Progress Note  Patient Name: Tony Williams Date of Encounter: 11/14/2020  [facility] Cardiologist: Kate Sable, MD    Patient Profile     75 y.o. male history of CAD-CABG (& PCI x 2), HTN/HLD & DM-2 with CKD-3B who presented to [facility]) with a non-STEMI and Acute on Chronic Diastolic Heart Failure.   Prior to going for catheterization, he was treated with IV diuresis which led to mild A on CKI actually treated with gentle hydration.  He continued to have ongoing chest pain and dyspnea therefore underwent cardiac catheterization yesterday by Dr. Saunders Revel at [facility] revealing severe native vessel CAD as previously indicated but also severe graft/anastomosis disease.  Due to the severity of disease and ongoing symptoms, he underwent IABP placement and was transferred to [facility] with planned high risk PCI today with Dr. Fletcher Anon.  Assessment & Plan    CAD status post CABG - PCI Presented with  NSTEMI:  LHC on 8/30 showed severe native coronary and bypass graft disease requiring IABP placement & txfr to [facility] for planned High Risk SVG PCI 8/31 with Dr. Fletcher Anon. --> Staged PCI to 2 sites in the SVG-D2-LAD with excellent results.  Downstream target vessels look much better. IABP removed post cath.  Did well afterwards.  Normal EDP at the time.  Nitroglycerin drip discontinued. DAPT with ASA and Plavix -with him being diabetic, we will convert to Brilinta with loading dose tomorrow (at least for the 1 month-cause assessment analysis to determine if he could potentially stay on it longer term versus switch back to Plavix -> this will need to be determined in the outpatient setting.). Back on home dose of amlodipine.  Also have restarted one half dose of home dose ACE inhibitor. On PCSK9 him at home.  Converted to atorvastatin here. Had been on beta-blocker while at [facility], this was discontinued due to bradycardia & 2:1 block.  Attempted to restart here, but bradycardic  with increased fatigue and exercise intolerance.  Therefore discontinued.    HFpEF-initially with acute on chronic HFpEF: LVEDP roughly 10 yesterday during cath.  Euvolemic on exam today with no PND or orthopnea.  Trivial edema mostly because of body habitus. Restarted low-dose ACE inhibitor (one half dose of previous home dose-anticipate titrating up to full dose in the outpatient setting) Back on home dose amlodipine not on beta-blocker for reasons noted above.    2:1 AV block (overnight 8/28 2022) -> then became bradycardic with exercise intolerance on beta-blocker: Beta-blocker stopped.    Acute on CKD stage IIIb: Essentially stabilized.  Would recommend outpatient chemistry panel checked early next week because of minimal elevation post cath.   HTN: Stable blood pressure now on home dose amlodipine.  Back on one half dose of home ACE inhibitor.   HLD (historically intolerant to statins however currently on Lipitor along with PCSK9 hematuria.): LDL 47.   Continue current dose of statin along with home PCSK9 admitted.   DM2: A1c 5.9 earlier this month.   Sugars remain controlled.  Defer to PCP in the outpatient setting. In the outpatient setting he can do because assessment analysis   COPD: Stable, not on current meds.   Obesity with OSA:  Need to reassess for restarting CPAP in the outpatient setting. Weight loss recommendations.  Dispo: Did better walking with [place] today.  Less fatigue.  Better without the beta-blocker.  He is ready to go home.  Feels well.  Plan discharge today.  ---------------------------------------------------------------------------------------------------------  Subjective  Notably better today.  Was quite fatigued yesterday while walking after beta-blocker started.  Felt better once it wore off.  Heart rates improved.  Did better to walking today with less fatigue.  No chest pain or pressure.  No access related issues. No PND orthopnea.  Inpatient  Medications    Scheduled Meds:  allopurinol  300 mg Oral Daily   amLODipine  10 mg Oral Daily   aspirin EC  81 mg Oral Daily   atorvastatin  80 mg Oral Daily   Chlorhexidine Gluconate Cloth  6 each Topical Daily   cholecalciferol  4,000 Units Oral Daily   enoxaparin (LOVENOX) injection  40 mg Subcutaneous Q24H   escitalopram  20 mg Oral Daily   gabapentin  600 mg Oral QHS   lisinopril  2.5 mg Oral Daily   omega-3 acid ethyl esters  1 g Oral BID   sodium chloride flush  3 mL Intravenous Q12H   sodium chloride flush  3 mL Intravenous Q12H   [START ON 11/15/2020] ticagrelor  180 mg Oral Once   Followed by   [START ON 11/15/2020] ticagrelor  90 mg Oral BID   Continuous Infusions:  sodium chloride     PRN Meds: sodium chloride, acetaminophen, fentaNYL (SUBLIMAZE) injection, HYDROcodone-acetaminophen, nitroGLYCERIN, ondansetron (ZOFRAN) IV, sodium chloride flush, traZODone   Vital Signs    Vitals:   11/14/20 0731 11/14/20 0800 11/14/20 0900 11/14/20 0913  BP:  124/74 (!) 130/53 (!) 130/53  Pulse:  72 72   Resp:  16 20   Temp: 98.7 F (37.1 C)     TempSrc: Oral     SpO2:  94% (!) 88%   Weight:      Height:        Intake/Output Summary (Last 24 hours) at 11/14/2020 1050 Last data filed at 11/13/2020 2232 Gross per 24 hour  Intake 463 ml  Output 3 ml  Net 460 ml   Last 3 Weights 11/14/2020 11/13/2020 11/12/2020  Weight (lbs) 270 lb 15.1 oz 268 lb 11.9 oz 271 lb 13.2 oz  Weight (kg) 122.9 kg 121.9 kg 123.3 kg      Telemetry    NSR - no pauses (did get brady to high 40s & 50s yesterday after BB dose)- Personally Reviewed  ECG    N/a- Personally Reviewed  Physical Exam   General appearance: alert, cooperative, appears stated age, no distress, and morbidly obese Neck: no carotid bruit and no JVD Lungs: clear to auscultation bilaterally, normal percussion bilaterally, and non-labored Heart: normal apical impulse, regular rate and rhythm, S4 present, and systolic murmur: early  systolic 1/6, medium pitch, crescendo, decrescendo, and harsh at 2nd right intercostal space Abdomen: soft, non-tender; bowel sounds normal; no masses,  no organomegaly and morbidly obese - unable to assess HSM Extremities: extremities normal, atraumatic, no cyanosis or edema and Radial cath site C/D/I - RFA IAPB site - no hematoma Neurologic: Alert and oriented X 3, normal strength and tone. Normal symmetric reflexes. Normal coordination and gait - just a bit slow gait   Cardiac Studies   Lexiscan MPI 05/2020: Large defect of severe severity present in the basal inferior, basal inferior lateral, mid inferior, and mid inferolateral location.  Findings were consistent with prior MI with moderate peri-infarct ischemia.  EF was 45%.  Overall, this was an intermediate risk study. 2D Echo 11/08/2020: (Difficult study) EF 50-55% (previously 60 to 65% in March 2020 with no R WMA.).  Low normal function.  Apical HK.  GRII relatively  stable. R&LHC 11/11/2020 (Dr. Saunders Revel): See above.  RA (mean): 10 mmHg; RV (S/EDP): 50/11 mmHg; PA (S/D, mean): 50/20 mmHg; PCWP (mean): 22 mmHg; ao sat 96%, PA sat 80%.  Fick Cardiac Output-Index: 8.0 - 3.4 -> IABP placed Staged DES PCI 8/31/20222 (Dr.Arida):  Prox Graft lesion between 2nd Diag and Mid LAD  is 90% stenosed - DES PCI STENT ONYX FRONTIER 4.0X15; Insertion lesion before 2nd Diag  is 90% stenosed. (Seq SVG-D2-mLAD) - DES PCI  STENT ONYX FRONTIER 4.0X15 -      Labs    High Sensitivity Troponin:   Recent Labs  Lab 11/08/20 0418 11/08/20 0616 11/10/20 0917  TROPONINIHS 376* 455* 1,117*      Chemistry Recent Labs  Lab 11/08/20 0418 11/09/20 0436 11/12/20 0134 11/13/20 0106 11/14/20 0046  NA 139   < > 138 138 137  K 4.7   < > 4.4 4.5 4.5  CL 103   < > 102 102 102  CO2 32   < > '29 30 31  '$ GLUCOSE 176*   < > 137* 152* 161*  BUN 27*   < > '23 22 23  '$ CREATININE 1.73*   < > 1.77* 1.89* 1.95*  CALCIUM 9.3   < > 8.9 8.4* 8.7*  PROT 7.2  --   --   --   --    ALBUMIN 3.7  --   --   --   --   AST 16  --   --   --   --   ALT 11  --   --   --   --   ALKPHOS 50  --   --   --   --   BILITOT 0.7  --   --   --   --   GFRNONAA 41*   < > 40* 37* 35*  ANIONGAP 4*   < > 7 6 4*   < > = values in this interval not displayed.     Hematology Recent Labs  Lab 11/11/20 0103 11/12/20 0134 11/13/20 0106  WBC 8.8 8.8 10.1  RBC 4.88 4.61 4.39  HGB 14.4 13.9 13.0  HCT 44.8 43.4 41.6  MCV 91.8 94.1 94.8  MCH 29.5 30.2 29.6  MCHC 32.1 32.0 31.3  RDW 15.0 15.2 15.1  PLT 171 151 137*    BNP Recent Labs  Lab 11/08/20 0558  BNP 138.7*     DDimer No results for input(s): DDIMER in the last 168 hours.   Radiology    CARDIAC CATHETERIZATION  Result Date: 11/12/2020   Dist LM lesion is 90% stenosed.   Mid LAD lesion is 100% stenosed.   Ost Cx to Prox Cx lesion is 100% stenosed.   Dist Graft lesion before 2nd Diag  is 99% stenosed.   Prox Graft lesion between 2nd Diag and Mid LAD  is 90% stenosed.   1st Diag lesion is 100% stenosed.   Insertion lesion before 2nd Diag  is 90% stenosed.   A drug-eluting stent was successfully placed using a STENT ONYX FRONTIER 4.0X15.   A drug-eluting stent was successfully placed using a STENT ONYX FRONTIER 4.0X15.   Post intervention, there is a 0% residual stenosis.   Post intervention, there is a 0% residual stenosis.   SVG and is large. Successful drug-eluting stent placement to the mid SVG to LAD/diagonal using distal protection device. Successful drug-eluting stent placement to the distal SVG to LAD/diagonal without distal protection device due to distal location. Recommendations: Dual antiplatelet  therapy indefinitely. Aggressive treatment of risk factors. Recommend removing intra-aortic balloon pump at 8:30 PM to allow washout of heparin.       For questions or updates, please contact [place] Please consult [email or web address] for contact info under        Signed, Glenetta Hew, MD  11/14/2020, 10:50 AM

## 2020-11-14 NOTE — Progress Notes (Signed)
Patient discharge in stable condition. D/C teaching provided with good verbal response. Spouse at bedside. All personal belongings with family.

## 2020-11-15 ENCOUNTER — Other Ambulatory Visit: Payer: Self-pay

## 2020-11-15 ENCOUNTER — Emergency Department (HOSPITAL_COMMUNITY): Payer: Medicare Other

## 2020-11-15 ENCOUNTER — Observation Stay (HOSPITAL_COMMUNITY)
Admission: EM | Admit: 2020-11-15 | Discharge: 2020-11-16 | Disposition: A | Discharge status: Home / Self Care | Payer: Medicare Other | Attending: Internal Medicine | Admitting: Internal Medicine

## 2020-11-15 ENCOUNTER — Encounter (HOSPITAL_COMMUNITY): Payer: Self-pay | Admitting: Emergency Medicine

## 2020-11-15 DIAGNOSIS — I214 Non-ST elevation (NSTEMI) myocardial infarction: Principal | ICD-10-CM | POA: Insufficient documentation

## 2020-11-15 DIAGNOSIS — J449 Chronic obstructive pulmonary disease, unspecified: Secondary | ICD-10-CM | POA: Diagnosis present

## 2020-11-15 DIAGNOSIS — N1831 Chronic kidney disease, stage 3a: Secondary | ICD-10-CM | POA: Insufficient documentation

## 2020-11-15 DIAGNOSIS — R0602 Shortness of breath: Secondary | ICD-10-CM

## 2020-11-15 DIAGNOSIS — Z7982 Long term (current) use of aspirin: Secondary | ICD-10-CM | POA: Insufficient documentation

## 2020-11-15 DIAGNOSIS — E1122 Type 2 diabetes mellitus with diabetic chronic kidney disease: Secondary | ICD-10-CM | POA: Diagnosis not present

## 2020-11-15 DIAGNOSIS — Z8551 Personal history of malignant neoplasm of bladder: Secondary | ICD-10-CM | POA: Diagnosis not present

## 2020-11-15 DIAGNOSIS — Z951 Presence of aortocoronary bypass graft: Secondary | ICD-10-CM | POA: Insufficient documentation

## 2020-11-15 DIAGNOSIS — E119 Type 2 diabetes mellitus without complications: Secondary | ICD-10-CM | POA: Diagnosis present

## 2020-11-15 DIAGNOSIS — Z20822 Contact with and (suspected) exposure to covid-19: Secondary | ICD-10-CM | POA: Insufficient documentation

## 2020-11-15 DIAGNOSIS — E1142 Type 2 diabetes mellitus with diabetic polyneuropathy: Secondary | ICD-10-CM

## 2020-11-15 DIAGNOSIS — I252 Old myocardial infarction: Secondary | ICD-10-CM

## 2020-11-15 DIAGNOSIS — Z79899 Other long term (current) drug therapy: Secondary | ICD-10-CM | POA: Diagnosis not present

## 2020-11-15 DIAGNOSIS — Z87891 Personal history of nicotine dependence: Secondary | ICD-10-CM | POA: Diagnosis not present

## 2020-11-15 DIAGNOSIS — I2510 Atherosclerotic heart disease of native coronary artery without angina pectoris: Secondary | ICD-10-CM

## 2020-11-15 DIAGNOSIS — Z955 Presence of coronary angioplasty implant and graft: Secondary | ICD-10-CM

## 2020-11-15 DIAGNOSIS — I5033 Acute on chronic diastolic (congestive) heart failure: Secondary | ICD-10-CM | POA: Insufficient documentation

## 2020-11-15 DIAGNOSIS — R0600 Dyspnea, unspecified: Secondary | ICD-10-CM | POA: Diagnosis present

## 2020-11-15 DIAGNOSIS — I25810 Atherosclerosis of coronary artery bypass graft(s) without angina pectoris: Secondary | ICD-10-CM

## 2020-11-15 DIAGNOSIS — I130 Hypertensive heart and chronic kidney disease with heart failure and stage 1 through stage 4 chronic kidney disease, or unspecified chronic kidney disease: Secondary | ICD-10-CM | POA: Diagnosis not present

## 2020-11-15 DIAGNOSIS — M1A30X0 Chronic gout due to renal impairment, unspecified site, without tophus (tophi): Secondary | ICD-10-CM | POA: Diagnosis present

## 2020-11-15 DIAGNOSIS — J90 Pleural effusion, not elsewhere classified: Secondary | ICD-10-CM | POA: Diagnosis not present

## 2020-11-15 LAB — COMPREHENSIVE METABOLIC PANEL
ALT: 18 U/L (ref 0–44)
AST: 21 U/L (ref 15–41)
Albumin: 3.1 g/dL — ABNORMAL LOW (ref 3.5–5.0)
Alkaline Phosphatase: 41 U/L (ref 38–126)
Anion gap: 13 (ref 5–15)
BUN: 23 mg/dL (ref 8–23)
CO2: 23 mmol/L (ref 22–32)
Calcium: 9.5 mg/dL (ref 8.9–10.3)
Chloride: 102 mmol/L (ref 98–111)
Creatinine, Ser: 1.98 mg/dL — ABNORMAL HIGH (ref 0.61–1.24)
GFR, Estimated: 35 mL/min — ABNORMAL LOW (ref >60)
Glucose, Bld: 189 mg/dL — ABNORMAL HIGH (ref 70–99)
Potassium: 3.7 mmol/L (ref 3.5–5.1)
Sodium: 138 mmol/L (ref 135–145)
Total Bilirubin: 0.9 mg/dL (ref 0.3–1.2)
Total Protein: 6.6 g/dL (ref 6.5–8.1)

## 2020-11-15 LAB — CBC
HCT: 42.5 % (ref 39.0–52.0)
Hemoglobin: 13.6 g/dL (ref 13.0–17.0)
MCH: 29.6 pg (ref 26.0–34.0)
MCHC: 32 g/dL (ref 30.0–36.0)
MCV: 92.6 fL (ref 80.0–100.0)
Platelets: 162 10*3/uL (ref 150–400)
RBC: 4.59 MIL/uL (ref 4.22–5.81)
RDW: 15 % (ref 11.5–15.5)
WBC: 10.8 10*3/uL — ABNORMAL HIGH (ref 4.0–10.5)
nRBC: 0 % (ref 0.0–0.2)

## 2020-11-15 LAB — RESP PANEL BY RT-PCR (FLU A&B, COVID) ARPGX2
Influenza A by PCR: NEGATIVE
Influenza B by PCR: NEGATIVE
SARS Coronavirus 2 by RT PCR: NEGATIVE

## 2020-11-15 LAB — CBC WITH DIFFERENTIAL/PLATELET
Abs Immature Granulocytes: 0.07 10*3/uL (ref 0.00–0.07)
Basophils Absolute: 0.1 10*3/uL (ref 0.0–0.1)
Basophils Relative: 1 %
Eosinophils Absolute: 0.5 10*3/uL (ref 0.0–0.5)
Eosinophils Relative: 5 %
HCT: 44.1 % (ref 39.0–52.0)
Hemoglobin: 13.9 g/dL (ref 13.0–17.0)
Immature Granulocytes: 1 %
Lymphocytes Relative: 10 %
Lymphs Abs: 1.1 10*3/uL (ref 0.7–4.0)
MCH: 29.7 pg (ref 26.0–34.0)
MCHC: 31.5 g/dL (ref 30.0–36.0)
MCV: 94.2 fL (ref 80.0–100.0)
Monocytes Absolute: 1 10*3/uL (ref 0.1–1.0)
Monocytes Relative: 10 %
Neutro Abs: 7.8 10*3/uL — ABNORMAL HIGH (ref 1.7–7.7)
Neutrophils Relative %: 73 %
Platelets: 174 10*3/uL (ref 150–400)
RBC: 4.68 MIL/uL (ref 4.22–5.81)
RDW: 14.9 % (ref 11.5–15.5)
WBC: 10.5 10*3/uL (ref 4.0–10.5)
nRBC: 0 % (ref 0.0–0.2)

## 2020-11-15 LAB — BASIC METABOLIC PANEL
Anion gap: 9 (ref 5–15)
BUN: 24 mg/dL — ABNORMAL HIGH (ref 8–23)
CO2: 26 mmol/L (ref 22–32)
Calcium: 9.6 mg/dL (ref 8.9–10.3)
Chloride: 101 mmol/L (ref 98–111)
Creatinine, Ser: 1.8 mg/dL — ABNORMAL HIGH (ref 0.61–1.24)
GFR, Estimated: 39 mL/min — ABNORMAL LOW (ref >60)
Glucose, Bld: 159 mg/dL — ABNORMAL HIGH (ref 70–99)
Potassium: 4.5 mmol/L (ref 3.5–5.1)
Sodium: 136 mmol/L (ref 135–145)

## 2020-11-15 LAB — D-DIMER, QUANTITATIVE: D-Dimer, Quant: 0.95 ug/mL-FEU — ABNORMAL HIGH (ref 0.00–0.50)

## 2020-11-15 LAB — BRAIN NATRIURETIC PEPTIDE: B Natriuretic Peptide: 283.1 pg/mL — ABNORMAL HIGH (ref 0.0–100.0)

## 2020-11-15 LAB — TROPONIN I (HIGH SENSITIVITY)
Troponin I (High Sensitivity): 539 ng/L (ref <18)
Troponin I (High Sensitivity): 625 ng/L (ref <18)
Troponin I (High Sensitivity): 366 ng/L (ref <18)

## 2020-11-15 LAB — GLUCOSE, CAPILLARY: Glucose-Capillary: 174 mg/dL — ABNORMAL HIGH (ref 70–99)

## 2020-11-15 MED ORDER — FENTANYL CITRATE PF 50 MCG/ML IJ SOSY
75.0000 ug | PREFILLED_SYRINGE | Freq: Once | INTRAMUSCULAR | Status: AC
  Administered 2020-11-15: 75 ug via INTRAVENOUS
  Filled 2020-11-15: qty 2

## 2020-11-15 MED ORDER — TRAZODONE HCL 50 MG PO TABS
150.0000 mg | ORAL_TABLET | Freq: Every evening | ORAL | Status: DC | PRN
  Administered 2020-11-15: 150 mg via ORAL
  Filled 2020-11-15: qty 3

## 2020-11-15 MED ORDER — HYDROCODONE-ACETAMINOPHEN 5-325 MG PO TABS
1.0000 | ORAL_TABLET | Freq: Three times a day (TID) | ORAL | Status: DC | PRN
Start: 2020-11-15 — End: 2020-11-16
  Administered 2020-11-15: 1 via ORAL
  Filled 2020-11-15: qty 1

## 2020-11-15 MED ORDER — ESCITALOPRAM OXALATE 10 MG PO TABS
20.0000 mg | ORAL_TABLET | Freq: Every day | ORAL | Status: DC
  Administered 2020-11-16: 20 mg via ORAL
  Filled 2020-11-15: qty 2

## 2020-11-15 MED ORDER — ENOXAPARIN SODIUM 40 MG/0.4ML IJ SOSY
40.0000 mg | PREFILLED_SYRINGE | INTRAMUSCULAR | Status: DC
  Administered 2020-11-15: 40 mg via SUBCUTANEOUS
  Filled 2020-11-15: qty 0.4

## 2020-11-15 MED ORDER — TICAGRELOR 90 MG PO TABS
90.0000 mg | ORAL_TABLET | Freq: Two times a day (BID) | ORAL | Status: DC
  Administered 2020-11-15 – 2020-11-16 (×2): 90 mg via ORAL
  Filled 2020-11-15 (×2): qty 1

## 2020-11-15 MED ORDER — ALBUTEROL SULFATE (2.5 MG/3ML) 0.083% IN NEBU: 3.0000 mL | INHALATION_SOLUTION | Freq: Four times a day (QID) | RESPIRATORY_TRACT | Status: DC | PRN

## 2020-11-15 MED ORDER — NITROGLYCERIN 0.4 MG SL SUBL
0.4000 mg | SUBLINGUAL_TABLET | SUBLINGUAL | Status: DC | PRN
  Administered 2020-11-15 (×3): 0.4 mg via SUBLINGUAL
  Filled 2020-11-15 (×4): qty 1

## 2020-11-15 MED ORDER — ALLOPURINOL 100 MG PO TABS
300.0000 mg | ORAL_TABLET | Freq: Every day | ORAL | Status: DC
  Administered 2020-11-16: 300 mg via ORAL
  Filled 2020-11-15: qty 3

## 2020-11-15 MED ORDER — PRAVASTATIN SODIUM 40 MG PO TABS
40.0000 mg | ORAL_TABLET | Freq: Every day | ORAL | Status: DC
Start: 2020-11-15 — End: 2020-11-16
  Administered 2020-11-15: 40 mg via ORAL
  Filled 2020-11-15: qty 1

## 2020-11-15 MED ORDER — GABAPENTIN 300 MG PO CAPS
600.0000 mg | ORAL_CAPSULE | Freq: Every day | ORAL | Status: DC
  Administered 2020-11-15: 600 mg via ORAL
  Filled 2020-11-15: qty 2

## 2020-11-15 MED ORDER — OMEGA-3-ACID ETHYL ESTERS 1 G PO CAPS
1.0000 g | ORAL_CAPSULE | Freq: Every day | ORAL | Status: DC
  Administered 2020-11-16: 1 g via ORAL
  Filled 2020-11-15: qty 1

## 2020-11-15 MED ORDER — ALBUTEROL (5 MG/ML) CONTINUOUS INHALATION SOLN
10.0000 mg/h | INHALATION_SOLUTION | Freq: Once | RESPIRATORY_TRACT | Status: AC
  Administered 2020-11-15: 10 mg/h via RESPIRATORY_TRACT
  Filled 2020-11-15: qty 20

## 2020-11-15 MED ORDER — OMEGA-3-ACID ETHYL ESTERS 1 G PO CAPS
ORAL_CAPSULE | Freq: Every day | ORAL | Status: DC
  Filled 2020-11-15: qty 1

## 2020-11-15 MED ORDER — INSULIN ASPART 100 UNIT/ML IJ SOLN
0.0000 [IU] | INTRAMUSCULAR | Status: DC
  Administered 2020-11-15 – 2020-11-16 (×2): 2 [IU] via SUBCUTANEOUS
  Administered 2020-11-16: 3 [IU] via SUBCUTANEOUS
  Administered 2020-11-16: 2 [IU] via SUBCUTANEOUS

## 2020-11-15 MED ORDER — LISINOPRIL 2.5 MG PO TABS
2.5000 mg | ORAL_TABLET | Freq: Every day | ORAL | Status: DC
  Administered 2020-11-16: 2.5 mg via ORAL
  Filled 2020-11-15: qty 1

## 2020-11-15 MED ORDER — AMLODIPINE BESYLATE 5 MG PO TABS
10.0000 mg | ORAL_TABLET | Freq: Every day | ORAL | Status: DC
  Administered 2020-11-16: 10 mg via ORAL
  Filled 2020-11-15: qty 2

## 2020-11-15 MED ORDER — FENTANYL CITRATE PF 50 MCG/ML IJ SOSY
50.0000 ug | PREFILLED_SYRINGE | Freq: Once | INTRAMUSCULAR | Status: AC
  Administered 2020-11-15: 50 ug via INTRAVENOUS
  Filled 2020-11-15: qty 1

## 2020-11-15 MED ORDER — ASPIRIN EC 81 MG PO TBEC
81.0000 mg | DELAYED_RELEASE_TABLET | Freq: Every day | ORAL | Status: DC
  Administered 2020-11-16: 81 mg via ORAL
  Filled 2020-11-15: qty 1

## 2020-11-15 NOTE — ED Provider Notes (Signed)
Emergency Medicine Provider Triage Evaluation Note  Tony Williams , a 75 y.o. male  was evaluated in triage.  Pt complains of SOB.  Review of Systems  Positive: SOB Negative: Fever, cough, cp  Physical Exam  BP (!) 164/62   Pulse 71   Temp 98.5 F (36.9 C) (Oral)   Resp (!) 6   SpO2 94%  Gen:   Awake, no distress   Resp:  Normal effort  MSK:   Moves extremities without difficulty  Other:    Medical Decision Making  Medically screening exam initiated at 4:18 PM.  Appropriate orders placed.  Tony Williams was informed that the remainder of the evaluation will be completed by another provider, this initial triage assessment does not replace that evaluation, and the importance of remaining in the ED until their evaluation is complete.  SOB started last night and today, was recently discharge home from hospital for NSTEMI.  No prior hx of PE/DVT.  Denies active CP   Tony Moras, PA-C 11/15/20 1619    Varney Biles, MD 11/16/20 1135

## 2020-11-15 NOTE — ED Provider Notes (Signed)
[facility] EMERGENCY DEPARTMENT Provider Note   CSN: [id number] Arrival date & time: 11/15/20  1558     History Chief Complaint  Patient presents with  . Shortness of Breath    Tony Williams is a 75 y.o. male.  HPI     75 y.o. male with CAD status post four-vessel CABG in 2007 with NSTEMI in 2018 status PCI x2, NSTEMI just few days back with complex lesion that required multiple stents comes in with chief complaint of shortness of breath.  Patient also has history of DM2, CKD stage III, HTN, HLD with statin intolerance, possible COPD with prior tobacco use quitting greater than 20 years ago, morbid obesity, and OSA not on CPAP.  Patient reports that he woke up this morning feeling short of breath.  The shortness of breath is constant.  It is worse with exertion.  He has no chest pain.  He has been taking his medications as prescribed.  Review of systems negative for any cough. Past Medical History:  Diagnosis Date  . CAD S/P percutaneous coronary angioplasty   . CAD, multiple vessel   . Cancer [facility]) 2013   Bladder  . Chronic kidney disease   . COPD (chronic obstructive pulmonary disease) ([place])   . Depression   . Diabetes mellitus without complication ([place])   . Heart problem   . Hx of CABG x 4    LIMA-D2, SVG-OM (occluded), SeqSVG-D1-LAD.    Patient Active Problem List   Diagnosis Date Noted  . Acute on chronic diastolic CHF (congestive heart failure) ([place]) 11/10/2020  . NSTEMI (non-ST elevated myocardial infarction) ([place]) 11/08/2020  . Positive colorectal cancer screening using Cologuard test   . Polyp of transverse colon   . Morbid obesity ([place]) 07/21/2020  . Chronic pain of both knees 07/21/2020  . Diabetic polyneuropathy associated with type 2 diabetes mellitus ([place]) 07/21/2020  . Stage 3a chronic kidney disease ([place]) 07/21/2020  . Chronic gout due to renal impairment without tophus 07/21/2020  . Hypertension associated with diabetes ([place])  07/21/2020  . Hyperlipidemia associated with type 2 diabetes mellitus ([place]) 07/21/2020  . GAD (generalized anxiety disorder) 07/21/2020  . Psychophysiological insomnia 07/21/2020  . Screening for blood or protein in urine 04/18/2020  . Vitamin D insufficiency 04/18/2020  . Chronic obstructive pulmonary disease ([place]) 04/18/2020  . Controlled type 2 diabetes mellitus with diabetic polyneuropathy, without long-term current use of insulin ([place]) 04/18/2020  . Coronary artery disease with hx of myocardial infarct w/o hx of CABG 04/18/2020  . S/P primary angioplasty with coronary stent 04/18/2020  . History of smoking at least 1 pack per day for at least 30 years 04/18/2020    Past Surgical History:  Procedure Laterality Date  . CHOLECYSTECTOMY, LAPAROSCOPIC  12/26/1995  . COLONOSCOPY WITH PROPOFOL N/A 09/23/2020   Procedure: COLONOSCOPY WITH PROPOFOL;  Surgeon: Lucilla Lame, MD;  Location: [facility] ENDOSCOPY;  Service: Endoscopy;  Laterality: N/A;  . CORONARY ARTERY BYPASS GRAFT     LIMA-D2, SVG-OM, SeqSVG-D1-LAD  . CORONARY STENT INTERVENTION N/A 11/12/2020   Procedure: CORONARY STENT INTERVENTION;  Surgeon: Wellington Hampshire, MD;  Location: [facility] CV LAB;  Service: Cardiovascular;  Laterality: N/A;  . EYE SURGERY    . IABP INSERTION N/A 11/11/2020   Procedure: IABP Insertion;  Surgeon: Nelva Bush, MD;  Location: [facility] CV LAB;  Service: Cardiovascular;  Laterality: N/A;  . IR STENT PLACEMENT ANTE CAROTID INC ANGIO    . KNEE SURGERY    . RIGHT/LEFT  HEART CATH AND CORONARY/GRAFT ANGIOGRAPHY N/A 11/11/2020   Procedure: RIGHT/LEFT HEART CATH AND CORONARY/GRAFT ANGIOGRAPHY;  Surgeon: Nelva Bush, MD;  Location: [facility] CV LAB;  Service: Cardiovascular;  Severe native CAD: 90% dLMCA-100% mLAD/100% ost LCx CTO w/ Severe diffuse RCA disease. Widely patent LIMA-D1. Patent sequential SVG-D2-LAD with 99% mid graft & 90% LIMA-D2 @ anastomosis. previously stented SVG-OM - CTO.  Mildly  . TRANSURETHRAL RESECTION OF PROSTATE  1994  . URINARY SPHINCTER IMPLANT  1995       Family History  Problem Relation Age of Onset  . Heart Problems Mother   . Heart Problems Father     Social History   Tobacco Use  . Smoking status: Former    Types: Cigarettes    Quit date: 10/23/2003    Years since quitting: 17.0  . Smokeless tobacco: Never  Vaping Use  . Vaping Use: Never used  Substance Use Topics  . Alcohol use: Never  . Drug use: Never    Home Medications Prior to Admission medications   Medication Sig Start Date End Date Taking? Authorizing Provider  albuterol (VENTOLIN HFA) 108 (90 Base) MCG/ACT inhaler Inhale 2 puffs into the lungs every 6 (six) hours as needed for wheezing or shortness of breath. 04/18/20  Yes Flinchum, Kelby Aline, FNP  allopurinol (ZYLOPRIM) 300 MG tablet Take 1 tablet (300 mg total) by mouth daily. 07/21/20  Yes Bacigalupo, Dionne Bucy, MD  amLODipine (NORVASC) 10 MG tablet Take 1 tablet (10 mg total) by mouth daily. 07/21/20  Yes Bacigalupo, Dionne Bucy, MD  aspirin EC 81 MG tablet Take 81 mg by mouth daily. Swallow whole.   Yes [provider]  Cholecalciferol (VITAMIN D3 PO) Take 4,000 Units by mouth.   Yes [provider]  Coenzyme Q10 (CO Q-10) 400 MG CAPS Take by mouth daily at 6 (six) AM.   Yes [provider]  desoximetasone (TOPICORT) 0.25 % cream Apply 1 application topically 2 (two) times daily. 10/24/20  Yes [provider]  escitalopram (LEXAPRO) 20 MG tablet Take 1 tablet (20 mg total) by mouth daily. 09/29/20  Yes Bacigalupo, Dionne Bucy, MD  Evolocumab (REPATHA SURECLICK) 466 MG/ML SOAJ Inject 1 pen into the skin every 14 (fourteen) days. 05/05/20  Yes Kate Sable, MD  gabapentin (NEURONTIN) 300 MG capsule Take 2 capsules (600 mg total) by mouth at bedtime. 07/21/20  Yes Bacigalupo, Dionne Bucy, MD  lisinopril (ZESTRIL) 5 MG tablet Take 0.5 tablets (2.5 mg total) by mouth daily. 11/14/20  Yes O'Neal, Cassie Freer, MD  lovastatin (MEVACOR) 40 MG tablet Take 40 mg by mouth at bedtime.   Yes [provider]  nitroGLYCERIN (NITROSTAT) 0.4 MG SL tablet Place 1 tablet (0.4 mg total) under the tongue every 5 (five) minutes x 3 doses as needed for chest pain. 11/14/20  Yes O'Neal, Cassie Freer, MD  Omega-3 Fatty Acids (FISH OIL) 1200 MG CAPS Take by mouth daily at 6 (six) AM.   Yes [provider]  ticagrelor (BRILINTA) 90 MG TABS tablet Take 1 tablet (90 mg total) by mouth 2 (two) times daily. 11/15/20  Yes O'Neal, Cassie Freer, MD  traZODone (DESYREL) 150 MG tablet Take 1-2 tablets (150-300 mg total) by mouth at bedtime as needed for sleep. 07/21/20  Yes Bacigalupo, Dionne Bucy, MD  blood glucose meter kit and supplies Dispense based on patient and insurance preference. Use up to four times daily as directed. (FOR ICD-10 E10.9, E11.9). check blood glucose fasting and before meals four times  daily. 04/18/20   Flinchum, Kelby Aline, FNP  fluticasone-salmeterol (ADVAIR HFA) 230-21 MCG/ACT inhaler Inhale 1-2 puffs into the lungs 2 (two) times daily. Rinse mouth after each use. Patient not taking: Reported on 11/15/2020 04/18/20   Flinchum, Kelby Aline, FNP  HYDROcodone-acetaminophen (NORCO) 5-325 MG tablet Take 1 tablet by mouth every 8 (eight) hours as needed for moderate pain. Patient not taking: Reported on 11/15/2020 10/21/20   Virginia Crews, MD    Allergies    Butorphanol, Morphine, Pedi-pre tape spray [wound dressing adhesive], Simvastatin, and Statins  Review of Systems   Review of Systems  Constitutional:  Positive for activity change.  Respiratory:  Positive for shortness of breath.   Cardiovascular:  Negative for chest pain.  Gastrointestinal:  Negative for nausea and vomiting.  Musculoskeletal:  Positive for back pain.  All other systems reviewed and are negative.  Physical Exam Updated Vital Signs BP (!) 155/64   Pulse 66   Temp 98.5 F (36.9 C) (Oral)   Resp 19   SpO2 95%    Physical Exam Vitals and nursing note reviewed.  Constitutional:      Appearance: He is well-developed.  HENT:     Head: Atraumatic.  Neck:     Vascular: No JVD.  Cardiovascular:     Rate and Rhythm: Normal rate.  Pulmonary:     Effort: Pulmonary effort is normal.     Breath sounds: No decreased breath sounds, wheezing, rhonchi or rales.  Musculoskeletal:     Cervical back: Neck supple.     Right lower leg: No tenderness. No edema.     Left lower leg: No tenderness. No edema.  Skin:    General: Skin is warm.  Neurological:     Mental Status: He is alert and oriented to person, place, and time.    ED Results / Procedures / Treatments   Labs (all labs ordered are listed, but only abnormal results are displayed) Labs Reviewed  BASIC METABOLIC PANEL - Abnormal; Notable for the following components:      Result Value   Glucose, Bld 159 (*)    BUN 24 (*)    Creatinine, Ser 1.80 (*)    GFR, Estimated 39 (*)    All other components within normal limits  CBC WITH DIFFERENTIAL/PLATELET - Abnormal; Notable for the following components:   Neutro Abs 7.8 (*)    All other components within normal limits  BRAIN NATRIURETIC PEPTIDE - Abnormal; Notable for the following components:   B Natriuretic Peptide 283.1 (*)    All other components within normal limits  D-DIMER, QUANTITATIVE - Abnormal; Notable for the following components:   D-Dimer, Quant 0.95 (*)    All other components within normal limits  TROPONIN I (HIGH SENSITIVITY) - Abnormal; Notable for the following components:   Troponin I (High Sensitivity) 539 (*)    All other components within normal limits  TROPONIN I (HIGH SENSITIVITY) - Abnormal; Notable for the following components:   Troponin I (High Sensitivity) 625 (*)    All other components within normal limits    EKG None  ED ECG REPORT   Date: 11/15/2020  Rate: 63  Rhythm: normal sinus rhythm  QRS Axis: normal  Intervals: normal  ST/T Wave  abnormalities: nonspecific ST changes and nonspecific T wave changes  Conduction Disutrbances:none  Narrative Interpretation:   Old EKG Reviewed: changes noted  I have personally reviewed the EKG tracing and agree with the computerized printout as noted.   Radiology DG Chest  2 View  Result Date: 11/15/2020 CLINICAL DATA:  Shortness of breath.  Hospital discharge yesterday. EXAM: CHEST - 2 VIEW COMPARISON:  Radiograph 11/11/2020, CT 11/08/2020 FINDINGS: Stable cardiomediastinal contours post CABG. Broken sternal wires again seen. Right pleural effusions/pleural thickening with streaky opacities at the right lung base, similar. There is no pulmonary edema or pneumothorax. No acute airspace disease. Stable compression fracture in the lower thoracic spine. IMPRESSION: 1. No acute chest finding. 2. Stable right pleural effusion/pleural thickening and streaky basilar opacity. Electronically Signed   By: Keith Rake M.D.   On: 11/15/2020 16:54    Procedures .Critical Care  Date/Time: 11/15/2020 8:49 PM Performed by: Varney Biles, MD Authorized by: Varney Biles, MD   Critical care provider statement:    Critical care time (minutes):  52   Critical care was necessary to treat or prevent imminent or life-threatening deterioration of the following conditions:  Cardiac failure   Critical care was time spent personally by me on the following activities:  Discussions with consultants, evaluation of patient's response to treatment, examination of patient, ordering and performing treatments and interventions, ordering and review of laboratory studies, ordering and review of radiographic studies, pulse oximetry, re-evaluation of patient's condition, obtaining history from patient or surrogate and review of old charts   Medications Ordered in ED Medications  fentaNYL (SUBLIMAZE) injection 75 mcg (75 mcg Intravenous Given 11/15/20 2031)    ED Course  I have reviewed the triage vital signs and the  nursing notes.  Pertinent labs & imaging results that were available during my care of the patient were reviewed by me and considered in my medical decision making (see chart for details).  Clinical Course as of 11/15/20 2053  Sat Nov 15, 2020  2051 Troponin is rising.  Cardiology service has assessed the patient.  They to are not quite sure what the underlying causes.  They recommend medicine admission with their service being on for consult. [AN]  2052 D-Dimer, Quant(!): 0.95 Sent from the provider in triage.  I do not think patient needs PE work-up, I would not have ordered a D-dimer, we will not pursue CT scan at this time [AN]  2052 B Natriuretic Peptide(!): 283.1 BNP is higher than the value during admission.  Might benefit with an echocardiogram [AN]  2053 DG Chest 2 View Mostly unchanged [AN]  2053 Card allergy team does not see significant benefit in heparin.  Will discuss the case with hospitalist and defer initiating heparin to their service. [AN]    Clinical Course User Index [AN] Varney Biles, MD   MDM Rules/Calculators/A&P                            75 year old male comes in with chief complaint of shortness of breath.  He just had NSTEMI with multiple stent placement.  Echocardiogram at that time revealed preserved EF.  Patient does have history of COPD.  Initial thought was that patient might be having restenosis of the stent and resultant MI.  EKG is not showing any concerning findings.    Other possibilities considered include CHF, pneumonia, PE. Lung exam is clear, no evidence of volume overload.  Patient just had a CT PE with his prior admission and it was negative for any emboli.  Chest x-ray does not reveal any new pneumonia.  I will consult cardiology and we will reassess. Initial high-sensitivity troponin is elevated, but lower than his NSTEMI peak.    Final Clinical  Impression(s) / ED Diagnoses Final diagnoses:  NSTEMI (non-ST elevated myocardial  infarction) [facility])    Rx / DC Orders ED Discharge Orders     None        Varney Biles, MD 11/15/20 2053

## 2020-11-15 NOTE — ED Triage Notes (Signed)
Pt reports shortness of breath that started last night. Pt was D/C from the hospital yesterday.

## 2020-11-15 NOTE — Consult Note (Signed)
Cardiology Consultation:   Patient ID: Tony Williams MRN: [id number]; DOB: 23-Jul-1945  Admit date: 11/15/2020 Date of Consult: 11/15/2020  Primary Care Provider: Virginia Crews, MD Primary Cardiologist: Kate Sable, MD  Primary Electrophysiologist:  None    Patient Profile:   Tony Williams is a 75 y.o. male with a hx of coronary artery disease status post CABG and multiple PCI, type 2 diabetes, CKD 3, hypertension, hyperlipidemia, obesity, OSA who is being seen today for the evaluation of shortness of breath at the request of emergency department.  History of Present Illness:   Tony Williams is a 75 year old man with a history of significant coronary disease status post four-vessel CABG in 2007 with multiple PCI including recent PCI to vein graft on 11/11/2020.  He also has a history of type 2 diabetes, CKD stage III, hypertension, hyperlipidemia, likely COPD, obesity, OSA.  He was discharged from the hospital yesterday in stable condition, notes that yesterday evening just prior to bed had some shortness of breath that was self-limiting.  Woke up today and has been short of breath most of the day with labored breathing.  Has not had any chest pain or left arm pain which is typically his anginal equivalent.  Does not note any other acute symptoms.  Nausea, vomiting, diarrhea, abdominal pain, palpitations.  No fevers or chills.  He notes that sometimes when he takes a deep breath it feels like a catching sensation.   Past Medical History:  Diagnosis Date   CAD S/P percutaneous coronary angioplasty    CAD, multiple vessel    Cancer ([place]) 2013   Bladder   Chronic kidney disease    COPD (chronic obstructive pulmonary disease) (HCC)    Depression    Diabetes mellitus without complication (HCC)    Heart problem    Hx of CABG x 4    LIMA-D2, SVG-OM (occluded), SeqSVG-D1-LAD.    Past Surgical History:  Procedure Laterality Date   CHOLECYSTECTOMY, LAPAROSCOPIC  12/26/1995    COLONOSCOPY WITH PROPOFOL N/A 09/23/2020   Procedure: COLONOSCOPY WITH PROPOFOL;  Surgeon: Lucilla Lame, MD;  Location: [facility];  Service: Endoscopy;  Laterality: N/A;   CORONARY ARTERY BYPASS GRAFT     LIMA-D2, SVG-OM, SeqSVG-D1-LAD   CORONARY STENT INTERVENTION N/A 11/12/2020   Procedure: CORONARY STENT INTERVENTION;  Surgeon: Wellington Hampshire, MD;  Location: [place] [facility];  Service: Cardiovascular;  Laterality: N/A;   EYE SURGERY     IABP INSERTION N/A 11/11/2020   Procedure: IABP Insertion;  Surgeon: Nelva Bush, MD;  Location: [place] [facility];  Service: Cardiovascular;  Laterality: N/A;   IR STENT PLACEMENT ANTE CAROTID INC ANGIO     KNEE SURGERY     RIGHT/LEFT HEART CATH AND CORONARY/GRAFT ANGIOGRAPHY N/A 11/11/2020   Procedure: RIGHT/LEFT HEART CATH AND CORONARY/GRAFT ANGIOGRAPHY;  Surgeon: Nelva Bush, MD;  Location: [place] [facility];  Service: Cardiovascular;  Severe native CAD: 90% dLMCA-100% mLAD/100% ost LCx CTO w/ Severe diffuse RCA disease. Widely patent LIMA-D1. Patent sequential SVG-D2-LAD with 99% mid graft & 90% LIMA-D2 @ anastomosis. previously stented SVG-OM - CTO. Mildly   TRANSURETHRAL RESECTION OF PROSTATE  1994   URINARY SPHINCTER IMPLANT  1995     Home Medications:  Prior to Admission medications   Medication Sig Start Date End Date Taking? Authorizing Provider  albuterol (VENTOLIN HFA) 108 (90 Base) MCG/ACT inhaler Inhale 2 puffs into the lungs every 6 (six) hours as needed for wheezing or shortness of breath. 04/18/20  Yes Flinchum, Sharyn Lull  S, FNP  allopurinol (ZYLOPRIM) 300 MG tablet Take 1 tablet (300 mg total) by mouth daily. 07/21/20  Yes Bacigalupo, Dionne Bucy, MD  amLODipine (NORVASC) 10 MG tablet Take 1 tablet (10 mg total) by mouth daily. 07/21/20  Yes Bacigalupo, Dionne Bucy, MD  aspirin EC 81 MG tablet Take 81 mg by mouth daily. Swallow whole.   Yes [provider]  Cholecalciferol (VITAMIN D3 PO) Take 4,000 Units by mouth.    Yes [provider]  Coenzyme Q10 (CO Q-10) 400 MG CAPS Take by mouth daily at 6 (six) AM.   Yes [provider]  desoximetasone (TOPICORT) 0.25 % cream Apply 1 application topically 2 (two) times daily. 10/24/20  Yes [provider]  escitalopram (LEXAPRO) 20 MG tablet Take 1 tablet (20 mg total) by mouth daily. 09/29/20  Yes Bacigalupo, Dionne Bucy, MD  Evolocumab (REPATHA SURECLICK) 916 MG/ML SOAJ Inject 1 pen into the skin every 14 (fourteen) days. 05/05/20  Yes Kate Sable, MD  gabapentin (NEURONTIN) 300 MG capsule Take 2 capsules (600 mg total) by mouth at bedtime. 07/21/20  Yes Bacigalupo, Dionne Bucy, MD  lisinopril (ZESTRIL) 5 MG tablet Take 0.5 tablets (2.5 mg total) by mouth daily. 11/14/20  Yes O'Neal, Cassie Freer, MD  lovastatin (MEVACOR) 40 MG tablet Take 40 mg by mouth at bedtime.   Yes [provider]  nitroGLYCERIN (NITROSTAT) 0.4 MG SL tablet Place 1 tablet (0.4 mg total) under the tongue every 5 (five) minutes x 3 doses as needed for chest pain. 11/14/20  Yes O'Neal, Cassie Freer, MD  Omega-3 Fatty Acids (FISH OIL) 1200 MG CAPS Take by mouth daily at 6 (six) AM.   Yes [provider]  ticagrelor (BRILINTA) 90 MG TABS tablet Take 1 tablet (90 mg total) by mouth 2 (two) times daily. 11/15/20  Yes O'Neal, Cassie Freer, MD  traZODone (DESYREL) 150 MG tablet Take 1-2 tablets (150-300 mg total) by mouth at bedtime as needed for sleep. 07/21/20  Yes Bacigalupo, Dionne Bucy, MD  blood glucose meter kit and supplies Dispense based on patient and insurance preference. Use up to four times daily as directed. (FOR ICD-10 E10.9, E11.9). check blood glucose fasting and before meals four times daily. 04/18/20   Flinchum, Kelby Aline, FNP  fluticasone-salmeterol (ADVAIR HFA) 230-21 MCG/ACT inhaler Inhale 1-2 puffs into the lungs 2 (two) times daily. Rinse mouth after each use. Patient not taking: Reported on 11/15/2020 04/18/20   Flinchum, Kelby Aline, FNP   HYDROcodone-acetaminophen (NORCO) 5-325 MG tablet Take 1 tablet by mouth every 8 (eight) hours as needed for moderate pain. Patient not taking: Reported on 11/15/2020 10/21/20   Virginia Crews, MD    Inpatient Medications: Scheduled Meds:  Continuous Infusions:  PRN Meds:   Allergies:    Allergies  Allergen Reactions   Butorphanol Itching   Morphine Other (See Comments)    Redness around IV site   Pedi-Pre Tape Spray [Wound Dressing Adhesive] Itching   Simvastatin Other (See Comments)    "Destroyed my muscles"   Statins Other (See Comments)    "Destroyed my muscles"    Social History:   Social History   Socioeconomic History   Marital status: Married    Spouse name: Not on file   Number of children: 2   Years of education: Not on file   Highest education level: High school graduate  Occupational History   Occupation: retired  Tobacco Use   Smoking status: Former    Types: Cigarettes  Quit date: 10/23/2003    Years since quitting: 17.0   Smokeless tobacco: Never  Vaping Use   Vaping Use: Never used  Substance and Sexual Activity   Alcohol use: Never   Drug use: Never   Sexual activity: Not on file  Other Topics Concern   Not on file  Social History Narrative   Not on file   Social Determinants of Health   Financial Resource Strain: Low Risk    Difficulty of Paying Living Expenses: Not hard at all  Food Insecurity: No Food Insecurity   Worried About Charity fundraiser in the Last Year: Never true   [place] in the Last Year: Never true  Transportation Needs: No Transportation Needs   Lack of Transportation (Medical): No   Lack of Transportation (Non-Medical): No  Physical Activity: Inactive   Days of Exercise per Week: 0 days   Minutes of Exercise per Session: 0 min  Stress: No Stress Concern Present   Feeling of Stress : Not at all  Social Connections: Moderately Isolated   Frequency of Communication with Friends and Family: More than  three times a week   Frequency of Social Gatherings with Friends and Family: Three times a week   Attends Religious Services: Never   Active Member of Clubs or Organizations: No   Attends Music therapist: Never   Marital Status: Married  Human resources officer Violence: Not At Risk   Fear of Current or Ex-Partner: No   Emotionally Abused: No   Physically Abused: No   Sexually Abused: No    Family History:    Family History  Problem Relation Age of Onset   Heart Problems Mother    Heart Problems Father      Review of Systems: [y] = yes, _0  = no    General: Weight gain _1 ; Weight loss _2 ; Anorexia _3 ; Fatigue _4 ; Fever _5 ; Chills _6 ; Weakness _7   Cardiac: Chest pain/pressure _8 ; Resting SOB [ y]; Exertional SOB [ y]; Orthopnea _9 ; Pedal Edema _10 ; Palpitations _11 ; Syncope _12 ; Presyncope _13 ; Paroxysmal nocturnal dyspnea_14   Pulmonary: Cough _15 ; Wheezing_16 ; Hemoptysis_17 ; Sputum _18 ; Snoring _19   GI: Vomiting_20 ; Dysphagia_21 ; Melena_22 ; Hematochezia _23 ; Heartburn_24 ; Abdominal pain _25 ; Constipation _26 ; Diarrhea _27 ; BRBPR _28   GU: Hematuria_29 ; Dysuria _30 ; Nocturia_31   Vascular: Pain in legs with walking _32 ; Pain in feet with lying flat _33 ; Non-healing sores _34 ; Stroke _35 ; TIA _36 ; Slurred speech _37 ;  Neuro: Headaches_38 ; Vertigo_39 ; Seizures_40 ; Paresthesias_41 ;Blurred vision _42 ; Diplopia _43 ; Vision changes _44   Ortho/Skin: Arthritis _45 ; Joint pain _46 ; Muscle pain _47 ; Joint swelling _48 ; Back Pain _49 ; Rash _50   Psych: Depression_51 ; Anxiety_52   Heme: Bleeding problems _53 ; Clotting disorders _54 ; Anemia _55   Endocrine: Diabetes _56 ; Thyroid dysfunction_57   Physical Exam/Data:   Vitals:   11/15/20 1930 11/15/20 2000 11/15/20 2028 11/15/20 2030  BP: (!) 153/71 (!) 152/91  (!) 155/64  Pulse: (!) 57 63 70 66  Resp: 19 18 (!) 22 19  Temp:      TempSrc:      SpO2: 90% 93% (S) (!) 88% 95%   No intake or output data in the 24 hours ending  11/15/20 2058  There were no vitals filed for this visit. There is no height or weight on file to calculate BMI.  General:  mildly labored breathing HEENT: normal Lymph: no adenopathy Neck: no JVD Endocrine:  No thryomegaly Vascular: No carotid bruits; FA pulses 2+ bilaterally without bruits  Cardiac:  normal S1, S2; RRR; no murmur  Lungs:  clear to auscultation bilaterally, no wheezing, rhonchi or rales  Abd: soft, nontender, no hepatomegaly  Ext: no edema Musculoskeletal:  No deformities, BUE and BLE strength normal and equal Skin: warm and dry  Neuro:  CNs 2-12 intact, no focal abnormalities noted Psych:  Normal affect   EKG:  The EKG was personally reviewed and demonstrates:  nsr, no st changes Telemetry:  Telemetry was personally reviewed and demonstrates:  sinus rhythm  Relevant CV Studies: Recent cath 11/12/2020:   Dist LM lesion is 90% stenosed.   Mid LAD lesion is 100% stenosed.   Ost Cx to Prox Cx lesion is 100% stenosed.   Dist Graft lesion before 2nd Diag  is 99% stenosed.   Prox Graft lesion between 2nd Diag and Mid LAD  is 90% stenosed.   1st Diag lesion is 100% stenosed.   Insertion lesion before 2nd Diag  is 90% stenosed.   A drug-eluting stent was successfully placed using a STENT ONYX FRONTIER 4.0X15.   A drug-eluting stent was successfully placed using a STENT ONYX FRONTIER 4.0X15.   Post intervention, there is a 0% residual stenosis.   Post intervention, there is a 0% residual stenosis.   SVG and is large.   Successful drug-eluting stent placement to the mid SVG to LAD/diagonal using distal protection device. Successful drug-eluting stent placement to the distal SVG to LAD/diagonal without distal protection device due to distal location.   Recommendations: Dual antiplatelet therapy indefinitely. Aggressive treatment of risk factors. Recommend removing intra-aortic balloon pump at 8:30 PM to allow washout of heparin.  Laboratory Data:  Chemistry Recent  Labs  Lab 11/13/20 0106 11/14/20 0046 11/15/20 1618  NA 138 137 136  K 4.5 4.5 4.5  CL 102 102 101  CO2 _0 GLUCOSE 152* 161* 159*  BUN 22 23 24*  CREATININE 1.89* 1.95* 1.80*  CALCIUM 8.4* 8.7* 9.6  GFRNONAA 37* 35* 39*  ANIONGAP 6 4* 9    No results for input(s): PROT, ALBUMIN, AST, ALT, ALKPHOS, BILITOT in the last 168 hours. Hematology Recent Labs  Lab 11/12/20 0134 11/13/20 0106 11/15/20 1618  WBC 8.8 10.1 10.5  RBC 4.61 4.39 4.68  HGB 13.9 13.0 13.9  HCT 43.4 41.6 44.1  MCV 94.1 94.8 94.2  MCH 30.2 29.6 29.7  MCHC 32.0 31.3 31.5  RDW 15.2 15.1 14.9  PLT 151 137* 174   Cardiac EnzymesNo results for input(s): TROPONINI in the last 168 hours. No results for input(s): TROPIPOC in the last 168 hours.  BNP Recent Labs  Lab 11/15/20 1618  BNP 283.1*    DDimer  Recent Labs  Lab 11/15/20 1618  DDIMER 0.95*    Radiology/Studies:  DG Chest 2 View  Result Date: 11/15/2020 CLINICAL DATA:  Shortness of breath.  Hospital discharge yesterday. EXAM: CHEST - 2 VIEW COMPARISON:  Radiograph 11/11/2020, CT 11/08/2020 FINDINGS: Stable cardiomediastinal contours post CABG. Broken sternal wires again seen. Right pleural effusions/pleural thickening with streaky opacities at the right lung base, similar. There is no pulmonary edema or pneumothorax. No acute airspace disease. Stable compression fracture in the lower thoracic spine. IMPRESSION: 1. No acute chest finding. 2. Stable  right pleural effusion/pleural thickening and streaky basilar opacity. Electronically Signed   By: Keith Rake M.D.   On: 11/15/2020 16:54   CARDIAC CATHETERIZATION  Result Date: 11/12/2020   Dist LM lesion is 90% stenosed.   Mid LAD lesion is 100% stenosed.   Ost Cx to Prox Cx lesion is 100% stenosed.   Dist Graft lesion before 2nd Diag  is 99% stenosed.   Prox Graft lesion between 2nd Diag and Mid LAD  is 90% stenosed.   1st Diag lesion is 100% stenosed.   Insertion lesion before 2nd Diag  is  90% stenosed.   A drug-eluting stent was successfully placed using a STENT ONYX FRONTIER 4.0X15.   A drug-eluting stent was successfully placed using a STENT ONYX FRONTIER 4.0X15.   Post intervention, there is a 0% residual stenosis.   Post intervention, there is a 0% residual stenosis.   SVG and is large. Successful drug-eluting stent placement to the mid SVG to LAD/diagonal using distal protection device. Successful drug-eluting stent placement to the distal SVG to LAD/diagonal without distal protection device due to distal location. Recommendations: Dual antiplatelet therapy indefinitely. Aggressive treatment of risk factors. Recommend removing intra-aortic balloon pump at 8:30 PM to allow washout of heparin.    Assessment and Plan:   Acute onset shortness of breath.  The etiology of his shortness of breath is not clearly evident to me.  He has significant coronary disease with recent PCI so not surprisingly has elevated troponin still in the setting of chronic kidney disease.  However we should monitor his symptoms closely and have low threshold to shoot his coronaries again if needed.  Blood pressures do not seem severely elevated to suggest hypertensive urgency/emergency and does not appear to be grossly in congestive heart failure given his BNP is not severely elevated.  Would recommend echo just to rule out any significant structural causes for shortness of breath and to take a closer look at his right heart.  Given the abrupt onset, PE is on the differential however he is otherwise hemodynamically stable without tachycardia or hypoxia.  I err on the side of avoiding contrasted studies for now given his CKD.  Would consider ruling out viral etiologies of shortness of breath as well.  Agree with admission to medicine for thorough evaluation and we will follow along.      For questions or updates, please contact [place] Please consult [email or web address] for contact info under     Signed, Doyne Keel, MD  11/15/2020 8:58 PM

## 2020-11-15 NOTE — H&P (Addendum)
History and Physical    Tony Williams ZOX:[id number] DOB: [birth year] DOA: 11/15/2020  PCP: Virginia Crews, MD  Patient coming from: Home.  Chief Complaint: Shortness of breath.  HPI: Tony Williams is a 75 y.o. male with history of CAD status post CABG status post stenting and recent admission for non-ST elevation MI underwent stenting again was discharged home yesterday presents to the ER because of worsening shortness of breath since this morning.  Patient states that after going home last evening he had mild shortness of breath which resolved without any intervention.  Beaking of this morning patient was getting short of breath even at rest and with exertion getting worse.  Denies any chest pain.  Denies any productive cough fever chills.  ED Course: In the ER patient was short of breath but not hypoxic chest x-ray does not show anything new.  COVID test was negative.  Patient labs show high sensitive troponin of 539 which increased to 625.  BNP was 283.  Cardiology on-call was consulted patient being admitted for shortness of breath cough not clear at this time.  EKG shows normal sinus rhythm repeat 1 shows tachycardia with inferior leads ST depressions.  Review of Systems: As per HPI, rest all negative.   Past Medical History:  Diagnosis Date   CAD S/P percutaneous coronary angioplasty    CAD, multiple vessel    Cancer ([place]) 2013   Bladder   Chronic kidney disease    COPD (chronic obstructive pulmonary disease) (HCC)    Depression    Diabetes mellitus without complication (HCC)    Heart problem    Hx of CABG x 4    LIMA-D2, SVG-OM (occluded), SeqSVG-D1-LAD.    Past Surgical History:  Procedure Laterality Date   CHOLECYSTECTOMY, LAPAROSCOPIC  12/26/1995   COLONOSCOPY WITH PROPOFOL N/A 09/23/2020   Procedure: COLONOSCOPY WITH PROPOFOL;  Surgeon: Lucilla Lame, MD;  Location[facility]COPY;  Service: Endoscopy;  Laterality: N/A;   CORONARY ARTERY BYPASS GRAFT      LIMA-D2, SVG-OM, SeqSVG-D1-LAD   CORONARY STENT INTERVENTION N/A 11/12/2020   Procedure: CORONARY STENT INTERVENTION;  Surgeon: Wellington Hampshire, MD;  Location: [place] CV LAB;  Service: Cardiovascular;  Laterality: N/A;   EYE SURGERY     IABP INSERTION N/A 11/11/2020   Procedure: IABP Insertion;  Surgeon: Nelva Bush, MD;  Location: [place] CV LAB;  Service: Cardiovascular;  Laterality: N/A;   IR STENT PLACEMENT ANTE CAROTID INC ANGIO     KNEE SURGERY     RIGHT/LEFT HEART CATH AND CORONARY/GRAFT ANGIOGRAPHY N/A 11/11/2020   Procedure: RIGHT/LEFT HEART CATH AND CORONARY/GRAFT ANGIOGRAPHY;  Surgeon: Nelva Bush, MD;  Location: [place] CV LAB;  Service: Cardiovascular;  Severe native CAD: 90% dLMCA-100% mLAD/100% ost LCx CTO w/ Severe diffuse RCA disease. Widely patent LIMA-D1. Patent sequential SVG-D2-LAD with 99% mid graft & 90% LIMA-D2 @ anastomosis. previously stented SVG-OM - CTO. Mildly   TRANSURETHRAL RESECTION OF PROSTATE  1994   URINARY SPHINCTER IMPLANT  1995     reports that he quit smoking about 17 years ago. His smoking use included cigarettes. He has never used smokeless tobacco. He reports that he does not drink alcohol and does not use drugs.  Allergies  Allergen Reactions   Butorphanol Itching   Morphine Other (See Comments)    Redness around IV site   Pedi-Pre Tape Spray [Wound Dressing Adhesive] Itching   Simvastatin Other (See Comments)    "Destroyed my muscles"   Statins Other (See Comments)    "  Destroyed my muscles"    Family History  Problem Relation Age of Onset   Heart Problems Mother    Heart Problems Father     Prior to Admission medications   Medication Sig Start Date End Date Taking? Authorizing Provider  albuterol (VENTOLIN HFA) 108 (90 Base) MCG/ACT inhaler Inhale 2 puffs into the lungs every 6 (six) hours as needed for wheezing or shortness of breath. 04/18/20  Yes Flinchum, Kelby Aline, FNP  allopurinol (ZYLOPRIM) 300 MG  tablet Take 1 tablet (300 mg total) by mouth daily. 07/21/20  Yes Bacigalupo, Dionne Bucy, MD  amLODipine (NORVASC) 10 MG tablet Take 1 tablet (10 mg total) by mouth daily. 07/21/20  Yes Bacigalupo, Dionne Bucy, MD  aspirin EC 81 MG tablet Take 81 mg by mouth daily. Swallow whole.   Yes [provider]  Cholecalciferol (VITAMIN D3 PO) Take 4,000 Units by mouth.   Yes [provider]  Coenzyme Q10 (CO Q-10) 400 MG CAPS Take by mouth daily at 6 (six) AM.   Yes [provider]  desoximetasone (TOPICORT) 0.25 % cream Apply 1 application topically 2 (two) times daily. 10/24/20  Yes [provider]  escitalopram (LEXAPRO) 20 MG tablet Take 1 tablet (20 mg total) by mouth daily. 09/29/20  Yes Bacigalupo, Dionne Bucy, MD  Evolocumab (REPATHA SURECLICK) 161 MG/ML SOAJ Inject 1 pen into the skin every 14 (fourteen) days. 05/05/20  Yes Kate Sable, MD  gabapentin (NEURONTIN) 300 MG capsule Take 2 capsules (600 mg total) by mouth at bedtime. 07/21/20  Yes Bacigalupo, Dionne Bucy, MD  lisinopril (ZESTRIL) 5 MG tablet Take 0.5 tablets (2.5 mg total) by mouth daily. 11/14/20  Yes O'Neal, Cassie Freer, MD  lovastatin (MEVACOR) 40 MG tablet Take 40 mg by mouth at bedtime.   Yes [provider]  nitroGLYCERIN (NITROSTAT) 0.4 MG SL tablet Place 1 tablet (0.4 mg total) under the tongue every 5 (five) minutes x 3 doses as needed for chest pain. 11/14/20  Yes O'Neal, Cassie Freer, MD  Omega-3 Fatty Acids (FISH OIL) 1200 MG CAPS Take by mouth daily at 6 (six) AM.   Yes [provider]  ticagrelor (BRILINTA) 90 MG TABS tablet Take 1 tablet (90 mg total) by mouth 2 (two) times daily. 11/15/20  Yes O'Neal, Cassie Freer, MD  traZODone (DESYREL) 150 MG tablet Take 1-2 tablets (150-300 mg total) by mouth at bedtime as needed for sleep. 07/21/20  Yes Bacigalupo, Dionne Bucy, MD  blood glucose meter kit and supplies Dispense based on patient and insurance preference. Use up to four times daily as  directed. (FOR ICD-10 E10.9, E11.9). check blood glucose fasting and before meals four times daily. 04/18/20   Flinchum, Kelby Aline, FNP  fluticasone-salmeterol (ADVAIR HFA) 230-21 MCG/ACT inhaler Inhale 1-2 puffs into the lungs 2 (two) times daily. Rinse mouth after each use. Patient not taking: Reported on 11/15/2020 04/18/20   Flinchum, Kelby Aline, FNP  HYDROcodone-acetaminophen (NORCO) 5-325 MG tablet Take 1 tablet by mouth every 8 (eight) hours as needed for moderate pain. Patient not taking: Reported on 11/15/2020 10/21/20   Virginia Crews, MD    Physical Exam: Constitutional: Moderately built and nourished. Vitals:   11/15/20 2112 11/15/20 2115 11/15/20 2145 11/15/20 2200  BP:  (!) 151/57 (!) 153/53 (!) 147/54  Pulse:  62 93 96  Resp:  14 (!) 21 16  Temp:      TempSrc:      SpO2: 92% 100% 99% 100%   Eyes: Anicteric no pallor.  ENMT: No discharge from the ears eyes nose and mouth. Neck: No mass felt.  No neck rigidity. Respiratory: No rhonchi or crepitations. Cardiovascular: S1-S2 heard. Abdomen: Soft nontender bowel sounds present. Musculoskeletal: No edema. Skin: No rash. Neurologic: Alert awake oriented to time place and person.  Moves all extremities. Psychiatric: Appears normal.  Normal affect.   Labs on Admission: I have personally reviewed following labs and imaging studies  CBC: Recent Labs  Lab 11/10/20 0608 11/11/20 0103 11/12/20 0134 11/13/20 0106 11/15/20 1618  WBC 9.2 8.8 8.8 10.1 10.5  NEUTROABS  --   --   --   --  7.8*  HGB 14.3 14.4 13.9 13.0 13.9  HCT 44.1 44.8 43.4 41.6 44.1  MCV 94.4 91.8 94.1 94.8 94.2  PLT 173 171 151 137* 086   Basic Metabolic Panel: Recent Labs  Lab 11/09/20 0436 11/10/20 0608 11/11/20 0104 11/12/20 0134 11/13/20 0106 11/14/20 0046 11/15/20 1618  NA 139 137 138 138 138 137 136  K 4.3 4.4 4.0 4.4 4.5 4.5 4.5  CL 100 99 98 102 102 102 101  CO2 29 32 31 29 30 31 26   GLUCOSE 109* 122* 142* 137* 152* 161* 159*  BUN 22  34* 33* 23 22 23  24*  CREATININE 1.63* 2.03* 1.83* 1.77* 1.89* 1.95* 1.80*  CALCIUM 9.0 8.9 8.6* 8.9 8.4* 8.7* 9.6  MG 1.8 1.8  --   --   --   --   --    GFR: Estimated Creatinine Clearance: 46.6 mL/min (A) (by C-G formula based on SCr of 1.8 mg/dL (H)). Liver Function Tests: No results for input(s): AST, ALT, ALKPHOS, BILITOT, PROT, ALBUMIN in the last 168 hours. No results for input(s): LIPASE, AMYLASE in the last 168 hours. No results for input(s): AMMONIA in the last 168 hours. Coagulation Profile: Recent Labs  Lab 11/12/20 0134  INR 1.0   Cardiac Enzymes: No results for input(s): CKTOTAL, CKMB, CKMBINDEX, TROPONINI in the last 168 hours. BNP (last 3 results) No results for input(s): PROBNP in the last 8760 hours. HbA1C: No results for input(s): HGBA1C in the last 72 hours. CBG: Recent Labs  Lab 11/13/20 1936 11/13/20 2351 11/14/20 0456 11/14/20 0727 11/14/20 1124  GLUCAP 147* 159* 150* 181* 126*   Lipid Profile: No results for input(s): CHOL, HDL, LDLCALC, TRIG, CHOLHDL, LDLDIRECT in the last 72 hours. Thyroid Function Tests: No results for input(s): TSH, T4TOTAL, FREET4, T3FREE, THYROIDAB in the last 72 hours. Anemia Panel: No results for input(s): VITAMINB12, FOLATE, FERRITIN, TIBC, IRON, RETICCTPCT in the last 72 hours. Urine analysis:    Component Value Date/Time   BILIRUBINUR negative 04/18/2020 1349   PROTEINUR Positive (A) 04/18/2020 1349   UROBILINOGEN 0.2 04/18/2020 1349   NITRITE negative 04/18/2020 1349   LEUKOCYTESUR Negative 04/18/2020 1349   Sepsis Labs: @LABRCNTIP (procalcitonin:4,lacticidven:4) ) Recent Results (from the past 240 hour(s))  Resp Panel by RT-PCR (Flu A&B, Covid) Nasopharyngeal Swab     Status: None   Collection Time: 11/08/20  4:39 AM   Specimen: Nasopharyngeal Swab; Nasopharyngeal(NP) swabs in vial transport medium  Result Value Ref Range Status   SARS Coronavirus 2 by RT PCR NEGATIVE NEGATIVE Final    Comment:  (NOTE) SARS-CoV-2 target nucleic acids are NOT DETECTED.  The SARS-CoV-2 RNA is generally detectable in upper respiratory specimens during the acute phase of infection. The lowest concentration of SARS-CoV-2 viral copies this assay can detect is 138 copies/mL. A negative result does not preclude SARS-Cov-2 infection and should not be used as  the sole basis for treatment or other patient management decisions. A negative result may occur with  improper specimen collection/handling, submission of specimen other than nasopharyngeal swab, presence of viral mutation(s) within the areas targeted by this assay, and inadequate number of viral copies(<138 copies/mL). A negative result must be combined with clinical observations, patient history, and epidemiological information. The expected result is Negative.  Fact Sheet for Patients:  EntrepreneurPulse.com.au  Fact Sheet for Healthcare Providers:  IncredibleEmployment.be  This test is no t yet approved or cleared by the [place] FDA and  has been authorized for detection and/or diagnosis of SARS-CoV-2 by FDA under an Emergency Use Authorization (EUA). This EUA will remain  in effect (meaning this test can be used) for the duration of the COVID-19 declaration under Section 564(b)(1) of the Act, 21 U.S.C.section 360bbb-3(b)(1), unless the authorization is terminated  or revoked sooner.       Influenza A by PCR NEGATIVE NEGATIVE Final   Influenza B by PCR NEGATIVE NEGATIVE Final    Comment: (NOTE) The Xpert Xpress SARS-CoV-2/FLU/RSV plus assay is intended as an aid in the diagnosis of influenza from Nasopharyngeal swab specimens and should not be used as a sole basis for treatment. Nasal washings and aspirates are unacceptable for Xpert Xpress SARS-CoV-2/FLU/RSV testing.  Fact Sheet for Patients: EntrepreneurPulse.com.au  Fact Sheet for Healthcare  Providers: IncredibleEmployment.be  This test is not yet approved or cleared by the [place] FDA and has been authorized for detection and/or diagnosis of SARS-CoV-2 by FDA under an Emergency Use Authorization (EUA). This EUA will remain in effect (meaning this test can be used) for the duration of the COVID-19 declaration under Section 564(b)(1) of the Act, 21 U.S.C. section 360bbb-3(b)(1), unless the authorization is terminated or revoked.  Performed at [facility], [address] [place], [place] [address]   MRSA Next Gen by PCR, Nasal     Status: None   Collection Time: 11/11/20 11:13 PM   Specimen: Nasal Mucosa; Nasal Swab  Result Value Ref Range Status   MRSA by PCR Next Gen NOT DETECTED NOT DETECTED Final    Comment: (NOTE) The GeneXpert MRSA Assay (FDA approved for NASAL specimens only), is one component of a comprehensive MRSA colonization surveillance program. It is not intended to diagnose MRSA infection nor to guide or monitor treatment for MRSA infections. Test performance is not FDA approved in patients less than 56 years old. Performed at [facility], [place] [address], [place], [place] [address]      Radiological Exams on Admission: DG Chest 2 View  Result Date: 11/15/2020 CLINICAL DATA:  Shortness of breath.  Hospital discharge yesterday. EXAM: CHEST - 2 VIEW COMPARISON:  Radiograph 11/11/2020, CT 11/08/2020 FINDINGS: Stable cardiomediastinal contours post CABG. Broken sternal wires again seen. Right pleural effusions/pleural thickening with streaky opacities at the right lung base, similar. There is no pulmonary edema or pneumothorax. No acute airspace disease. Stable compression fracture in the lower thoracic spine. IMPRESSION: 1. No acute chest finding. 2. Stable right pleural effusion/pleural thickening and streaky basilar opacity. Electronically Signed   By: Keith Rake M.D.   On: 11/15/2020 16:54    EKG:  Independently reviewed.  Initial EKG shows normal sinus rhythm repeat 1 shows sinus tachycardia with ST depression in the inferior leads.  Assessment/Plan Principal Problem:   Dyspnea Active Problems:   Chronic obstructive pulmonary disease (HCC)   Controlled type 2 diabetes mellitus with diabetic polyneuropathy, without long-term current use of insulin ([place])  Coronary artery disease with hx of myocardial infarct w/o hx of CABG   S/P primary angioplasty with coronary stent   Stage 3a chronic kidney disease (HCC)   Chronic gout due to renal impairment without tophus    Acute shortness of breath status post recent cardiac stent for non-ST elevation MI -because of shortness of breath not clear appreciate cardiology consult.  At this time we will observe trend cardiac markers may need repeat 2D echo or cath. Recent non-ST relation MI status post stent placement denies any chest pain.  However patient is short of breath.  We will be trending cardiac markers.  We will continue patient's aspirin Brilinta statins.  Patient is not on beta-blocker due to patient being bradycardic during last admission. Hypertension on amlodipine and lisinopril. History of diastolic dysfunction 2D echo done on November 08, 2020 showed EF of 50 to 55%.  Closely monitor.  May need repeat 2D echo. Chronic kidney disease stage III creatinine appears to be at baseline. Diabetes mellitus type 2 last hemoglobin A1c on April 22, 2020 was 6.4.  On sliding scale coverage.  Addendum -patient started on having chest pressure and EKG was showing changes discussed with Dr. Marcelle Smiling cardiologist on-call who advised keeping patient on heparin infusion.   DVT prophylaxis: Heparin infusion. Code Status: Full code. Family Communication: Patient's son at the bedside. Disposition Plan: Home. Consults called: Cardiology. Admission status: Observation.   Rise Patience MD [facility] Pager [phone].  If 7PM-7AM,  please contact night-coverage [email or web address] Password [facility]  11/15/2020, 10:22 PM

## 2020-11-15 NOTE — ED Notes (Signed)
Troponin 539 reported to [place] and [place] RN

## 2020-11-16 ENCOUNTER — Observation Stay (HOSPITAL_BASED_OUTPATIENT_CLINIC_OR_DEPARTMENT_OTHER): Payer: Medicare Other

## 2020-11-16 DIAGNOSIS — R0609 Other forms of dyspnea: Secondary | ICD-10-CM

## 2020-11-16 DIAGNOSIS — M1A30X0 Chronic gout due to renal impairment, unspecified site, without tophus (tophi): Secondary | ICD-10-CM

## 2020-11-16 DIAGNOSIS — J449 Chronic obstructive pulmonary disease, unspecified: Secondary | ICD-10-CM

## 2020-11-16 DIAGNOSIS — I2510 Atherosclerotic heart disease of native coronary artery without angina pectoris: Secondary | ICD-10-CM | POA: Diagnosis not present

## 2020-11-16 DIAGNOSIS — Z955 Presence of coronary angioplasty implant and graft: Secondary | ICD-10-CM | POA: Diagnosis not present

## 2020-11-16 DIAGNOSIS — I214 Non-ST elevation (NSTEMI) myocardial infarction: Principal | ICD-10-CM

## 2020-11-16 DIAGNOSIS — R0602 Shortness of breath: Secondary | ICD-10-CM

## 2020-11-16 DIAGNOSIS — E1142 Type 2 diabetes mellitus with diabetic polyneuropathy: Secondary | ICD-10-CM | POA: Diagnosis not present

## 2020-11-16 LAB — TROPONIN I (HIGH SENSITIVITY): Troponin I (High Sensitivity): 353 ng/L (ref <18)

## 2020-11-16 LAB — ECHOCARDIOGRAM LIMITED
Height: 69 in
Weight: 4285.74 oz

## 2020-11-16 LAB — GLUCOSE, CAPILLARY
Glucose-Capillary: 126 mg/dL — ABNORMAL HIGH (ref 70–99)
Glucose-Capillary: 152 mg/dL — ABNORMAL HIGH (ref 70–99)
Glucose-Capillary: 175 mg/dL — ABNORMAL HIGH (ref 70–99)
Glucose-Capillary: 202 mg/dL — ABNORMAL HIGH (ref 70–99)
Glucose-Capillary: 228 mg/dL — ABNORMAL HIGH (ref 70–99)

## 2020-11-16 MED ORDER — PERFLUTREN LIPID MICROSPHERE
1.0000 mL | INTRAVENOUS | Status: AC | PRN
  Administered 2020-11-16: 2 mL via INTRAVENOUS
  Filled 2020-11-16: qty 10

## 2020-11-16 MED ORDER — HEPARIN BOLUS VIA INFUSION
4000.0000 [IU] | Freq: Once | INTRAVENOUS | Status: AC
  Administered 2020-11-16: 4000 [IU] via INTRAVENOUS
  Filled 2020-11-16: qty 4000

## 2020-11-16 MED ORDER — HEPARIN (PORCINE) 25000 UT/250ML-% IV SOLN
1800.0000 [IU]/h | INTRAVENOUS | Status: DC
  Administered 2020-11-16: 1800 [IU]/h via INTRAVENOUS
  Filled 2020-11-16: qty 250

## 2020-11-16 NOTE — Progress Notes (Signed)
ANTICOAGULATION CONSULT NOTE - Initial Consult  Pharmacy Consult for Heparin Indication: chest pain/ACS  Allergies  Allergen Reactions   Butorphanol Itching   Morphine Other (See Comments)    Redness around IV site   Pedi-Pre Tape Spray [Wound Dressing Adhesive] Itching   Simvastatin Other (See Comments)    "Destroyed my muscles"   Statins Other (See Comments)    "Destroyed my muscles"    Patient Measurements: Height: 5' 9"  (175.3 cm) Weight: 121.5 kg (267 lb 13.7 oz) IBW/kg (Calculated) : 70.7 Heparin Dosing Weight: 98 kg  Vital Signs: Temp: 98.6 F (37 C) (09/04 0410) Temp Source: Oral (09/04 0410) BP: 126/55 (09/04 0410) Pulse Rate: 77 (09/04 0410)  Labs: Recent Labs    11/14/20 0046 11/15/20 1618 11/15/20 1618 11/15/20 1819 11/15/20 2240 11/16/20 0221  HGB  --  13.9  --   --  13.6  --   HCT  --  44.1  --   --  42.5  --   PLT  --  174  --   --  162  --   CREATININE 1.95* 1.80*  --   --  1.98*  --   TROPONINIHS  --  539*   < > 625* 366* 353*   < > = values in this interval not displayed.    Estimated Creatinine Clearance: 42.1 mL/min (A) (by C-G formula based on SCr of 1.98 mg/dL (H)).   Medical History: Past Medical History:  Diagnosis Date   CAD S/P percutaneous coronary angioplasty    CAD, multiple vessel    Cancer ([place]) 2013   Bladder   Chronic kidney disease    COPD (chronic obstructive pulmonary disease) (HCC)    Depression    Diabetes mellitus without complication (HCC)    Heart problem    Hx of CABG x 4    LIMA-D2, SVG-OM (occluded), SeqSVG-D1-LAD.    Medications:  Medications Prior to Admission  Medication Sig Dispense Refill Last Dose   albuterol (VENTOLIN HFA) 108 (90 Base) MCG/ACT inhaler Inhale 2 puffs into the lungs every 6 (six) hours as needed for wheezing or shortness of breath. 8 g 2 unk   allopurinol (ZYLOPRIM) 300 MG tablet Take 1 tablet (300 mg total) by mouth daily. 90 tablet 3 11/15/2020   amLODipine (NORVASC) 10 MG tablet  Take 1 tablet (10 mg total) by mouth daily. 90 tablet 1 11/15/2020   aspirin EC 81 MG tablet Take 81 mg by mouth daily. Swallow whole.   11/15/2020 at 0930   Cholecalciferol (VITAMIN D3 PO) Take 4,000 Units by mouth.   11/15/2020   Coenzyme Q10 (CO Q-10) 400 MG CAPS Take by mouth daily at 6 (six) AM.   11/15/2020   desoximetasone (TOPICORT) 0.25 % cream Apply 1 application topically 2 (two) times daily.   11/15/2020   escitalopram (LEXAPRO) 20 MG tablet Take 1 tablet (20 mg total) by mouth daily. 90 tablet 1 11/15/2020   Evolocumab (REPATHA SURECLICK) 354 MG/ML SOAJ Inject 1 pen into the skin every 14 (fourteen) days. 2 mL 11 Past Month   gabapentin (NEURONTIN) 300 MG capsule Take 2 capsules (600 mg total) by mouth at bedtime. 180 capsule 1 11/14/2020   lisinopril (ZESTRIL) 5 MG tablet Take 0.5 tablets (2.5 mg total) by mouth daily.   11/15/2020   lovastatin (MEVACOR) 40 MG tablet Take 40 mg by mouth at bedtime.   11/14/2020   nitroGLYCERIN (NITROSTAT) 0.4 MG SL tablet Place 1 tablet (0.4 mg total) under the tongue every  5 (five) minutes x 3 doses as needed for chest pain. 25 tablet 1 unk   Omega-3 Fatty Acids (FISH OIL) 1200 MG CAPS Take by mouth daily at 6 (six) AM.   11/15/2020   ticagrelor (BRILINTA) 90 MG TABS tablet Take 1 tablet (90 mg total) by mouth 2 (two) times daily. 60 tablet 11 11/15/2020   traZODone (DESYREL) 150 MG tablet Take 1-2 tablets (150-300 mg total) by mouth at bedtime as needed for sleep. 180 tablet 1 11/14/2020   blood glucose meter kit and supplies Dispense based on patient and insurance preference. Use up to four times daily as directed. (FOR ICD-10 E10.9, E11.9). check blood glucose fasting and before meals four times daily. 1 each 0    fluticasone-salmeterol (ADVAIR HFA) 230-21 MCG/ACT inhaler Inhale 1-2 puffs into the lungs 2 (two) times daily. Rinse mouth after each use. (Patient not taking: Reported on 11/15/2020) 1 each 12 Not Taking   HYDROcodone-acetaminophen (NORCO) 5-325 MG tablet Take 1  tablet by mouth every 8 (eight) hours as needed for moderate pain. (Patient not taking: Reported on 11/15/2020) 90 tablet 0 Completed Course    Assessment: 75 y.o. M presents with SOB. Troponin elevated. To begin heparin for r/o ACS. CBC ok on admission. No AC PTA. Pt was recently at [facility] and required heparin doses of 1800 units/hr.  Goal of Therapy:  Heparin level 0.3-0.7 units/ml Monitor platelets by anticoagulation protocol: Yes   Plan:  Heparin IV bolus 4000 Heparin gtt at 1800 units/hr Will f/u heparin level in 8 hours Daily heparin level and CBC  Sherlon Handing, PharmD, BCPS Please see amion for complete clinical pharmacist phone list 11/16/2020,5:47 AM

## 2020-11-16 NOTE — Plan of Care (Signed)
  Problem: Pain Managment: Goal: General experience of comfort will improve Outcome: Progressing   

## 2020-11-16 NOTE — Progress Notes (Signed)
PROGRESS NOTE    Tony Williams  [id number] DOB: Mar 16, 1945 DOA: 11/15/2020 PCP: Virginia Crews, MD  Brief Narrative:Tony Williams is a 75 y.o. male with history of CAD status post CABG status post stenting and recent admission for non-ST elevation MI underwent stenting x2, of mid and distal SVG to LAD on 8/31, he was discharged home on 9/2, started on Brilinta the day of discharge.  Experienced some dyspnea that evening, felt well the rest of the day, Saturday 9/3 AM woke up feeling well took his medicines and about 30 minutes later started experiencing acute shortness of breath at rest.  He also had some chest pain/discomfort  -In the ED his vitals were stable, chest x-ray was unremarkable, COVID-19 PCR was negative, high-sensitivity troponin was 6-5 from 539, BNP was 283, EKG noted tachycardia and inferior ST depressions   Assessment & Plan:   Principal Problem:   Dyspnea -Clinically appears euvolemic, suspicion for ACS is low -I suspect his symptoms may be secondary to Brilinta, he was started on this 9/2 the day of discharge, will defer to cards if Plavix is a better option now -Await cardiology input -? Stop IV heparin  NSTEMI/CAD/CABG -Discharged on 9/2 following stenting of mid and distal SVG to LAD, -Known multivessel CAD, not felt to be a candidate for redo CABG -Was taken off beta-blockers last week due to bradycardia -Continue aspirin, statin, ?change Brilinta to Plavix  Hypertension -Stable, continue amlodipine and lisinopril  CKD 3a -Creatinine stable  Type 2 diabetes mellitus -CBGs are stable, hemoglobin A1c was 6.4, continue sliding scale  DVT prophylaxis: IV heparin Code Status: Full code Family Communication: Wife and daughter at bedside Disposition Plan:  Status is: Observation  Dispo: The patient is from: Home              Anticipated d/c is to: Home              Patient currently is not medically stable to d/c.   Difficult to place patient  No        Consultants:  Cardiology  Procedures:   Antimicrobials:    Subjective: -Feels okay today, denies any dyspnea  Objective: Vitals:   11/15/20 2327 11/16/20 0010 11/16/20 0410 11/16/20 0847  BP: 115/73 (!) 128/47 (!) 126/55 137/77  Pulse: (!) 107 (!) 105 77 66  Resp: '18 19 18 17  '$ Temp: 98.9 F (37.2 C) 98.7 F (37.1 C) 98.6 F (37 C)   TempSrc: Axillary Oral Oral   SpO2: 94% 95% 98% 96%  Weight:      Height:        Intake/Output Summary (Last 24 hours) at 11/16/2020 1029 Last data filed at 11/16/2020 0934 Gross per 24 hour  Intake --  Output 325 ml  Net -325 ml   Filed Weights   11/15/20 2238  Weight: 121.5 kg    Examination:  General exam: Obese male appears calm and comfortable, AAOx3, no distress Respiratory system: Clear to auscultation Cardiovascular system: S1 & S2 heard, RRR.  Abd: nondistended, soft and nontender.Normal bowel sounds heard. Central nervous system: Alert and oriented. No focal neurological deficits. Extremities: no edema Skin: No rashes Psychiatry: Judgement and insight appear normal. Mood & affect appropriate.     Data Reviewed:   CBC: Recent Labs  Lab 11/11/20 0103 11/12/20 0134 11/13/20 0106 11/15/20 1618 11/15/20 2240  WBC 8.8 8.8 10.1 10.5 10.8*  NEUTROABS  --   --   --  7.8*  --   HGB 14.4  13.9 13.0 13.9 13.6  HCT 44.8 43.4 41.6 44.1 42.5  MCV 91.8 94.1 94.8 94.2 92.6  PLT 171 151 137* 174 0000000   Basic Metabolic Panel: Recent Labs  Lab 11/10/20 0608 11/11/20 0104 11/12/20 0134 11/13/20 0106 11/14/20 0046 11/15/20 1618 11/15/20 2240  NA 137   < > 138 138 137 136 138  K 4.4   < > 4.4 4.5 4.5 4.5 3.7  CL 99   < > 102 102 102 101 102  CO2 32   < > '29 30 31 26 23  '$ GLUCOSE 122*   < > 137* 152* 161* 159* 189*  BUN 34*   < > '23 22 23 '$ 24* 23  CREATININE 2.03*   < > 1.77* 1.89* 1.95* 1.80* 1.98*  CALCIUM 8.9   < > 8.9 8.4* 8.7* 9.6 9.5  MG 1.8  --   --   --   --   --   --    < > = values in this  interval not displayed.   GFR: Estimated Creatinine Clearance: 42.1 mL/min (A) (by C-G formula based on SCr of 1.98 mg/dL (H)). Liver Function Tests: Recent Labs  Lab 11/15/20 2240  AST 21  ALT 18  ALKPHOS 41  BILITOT 0.9  PROT 6.6  ALBUMIN 3.1*   No results for input(s): LIPASE, AMYLASE in the last 168 hours. No results for input(s): AMMONIA in the last 168 hours. Coagulation Profile: Recent Labs  Lab 11/12/20 0134  INR 1.0   Cardiac Enzymes: No results for input(s): CKTOTAL, CKMB, CKMBINDEX, TROPONINI in the last 168 hours. BNP (last 3 results) No results for input(s): PROBNP in the last 8760 hours. HbA1C: No results for input(s): HGBA1C in the last 72 hours. CBG: Recent Labs  Lab 11/14/20 1124 11/15/20 2239 11/16/20 0009 11/16/20 0409 11/16/20 0740  GLUCAP 126* 174* 228* 175* 152*   Lipid Profile: No results for input(s): CHOL, HDL, LDLCALC, TRIG, CHOLHDL, LDLDIRECT in the last 72 hours. Thyroid Function Tests: No results for input(s): TSH, T4TOTAL, FREET4, T3FREE, THYROIDAB in the last 72 hours. Anemia Panel: No results for input(s): VITAMINB12, FOLATE, FERRITIN, TIBC, IRON, RETICCTPCT in the last 72 hours. Urine analysis:    Component Value Date/Time   BILIRUBINUR negative 04/18/2020 1349   PROTEINUR Positive (A) 04/18/2020 1349   UROBILINOGEN 0.2 04/18/2020 1349   NITRITE negative 04/18/2020 1349   LEUKOCYTESUR Negative 04/18/2020 1349   Sepsis Labs: '@LABRCNTIP'$ (procalcitonin:4,lacticidven:4)  ) Recent Results (from the past 240 hour(s))  Resp Panel by RT-PCR (Flu A&B, Covid) Nasopharyngeal Swab     Status: None   Collection Time: 11/08/20  4:39 AM   Specimen: Nasopharyngeal Swab; Nasopharyngeal(NP) swabs in vial transport medium  Result Value Ref Range Status   SARS Coronavirus 2 by RT PCR NEGATIVE NEGATIVE Final    Comment: (NOTE) SARS-CoV-2 target nucleic acids are NOT DETECTED.  The SARS-CoV-2 RNA is generally detectable in upper  respiratory specimens during the acute phase of infection. The lowest concentration of SARS-CoV-2 viral copies this assay can detect is 138 copies/mL. A negative result does not preclude SARS-Cov-2 infection and should not be used as the sole basis for treatment or other patient management decisions. A negative result may occur with  improper specimen collection/handling, submission of specimen other than nasopharyngeal swab, presence of viral mutation(s) within the areas targeted by this assay, and inadequate number of viral copies(<138 copies/mL). A negative result must be combined with clinical observations, patient history, and epidemiological information. The expected result is  Negative.  Fact Sheet for Patients:  EntrepreneurPulse.com.au  Fact Sheet for Healthcare Providers:  IncredibleEmployment.be  This test is no t yet approved or cleared by the [place] FDA and  has been authorized for detection and/or diagnosis of SARS-CoV-2 by FDA under an Emergency Use Authorization (EUA). This EUA will remain  in effect (meaning this test can be used) for the duration of the COVID-19 declaration under Section 564(b)(1) of the Act, 21 U.S.C.section 360bbb-3(b)(1), unless the authorization is terminated  or revoked sooner.       Influenza A by PCR NEGATIVE NEGATIVE Final   Influenza B by PCR NEGATIVE NEGATIVE Final    Comment: (NOTE) The Xpert Xpress SARS-CoV-2/FLU/RSV plus assay is intended as an aid in the diagnosis of influenza from Nasopharyngeal swab specimens and should not be used as a sole basis for treatment. Nasal washings and aspirates are unacceptable for Xpert Xpress SARS-CoV-2/FLU/RSV testing.  Fact Sheet for Patients: EntrepreneurPulse.com.au  Fact Sheet for Healthcare Providers: IncredibleEmployment.be  This test is not yet approved or cleared by the [place] FDA and has been  authorized for detection and/or diagnosis of SARS-CoV-2 by FDA under an Emergency Use Authorization (EUA). This EUA will remain in effect (meaning this test can be used) for the duration of the COVID-19 declaration under Section 564(b)(1) of the Act, 21 U.S.C. section 360bbb-3(b)(1), unless the authorization is terminated or revoked.  Performed at [facility], [address] [place], [place] [address]   MRSA Next Gen by PCR, Nasal     Status: None   Collection Time: 11/11/20 11:13 PM   Specimen: Nasal Mucosa; Nasal Swab  Result Value Ref Range Status   MRSA by PCR Next Gen NOT DETECTED NOT DETECTED Final    Comment: (NOTE) The GeneXpert MRSA Assay (FDA approved for NASAL specimens only), is one component of a comprehensive MRSA colonization surveillance program. It is not intended to diagnose MRSA infection nor to guide or monitor treatment for MRSA infections. Test performance is not FDA approved in patients less than 51 years old. Performed at [facility], [place] [address], [place], [place] [address]   Resp Panel by RT-PCR (Flu A&B, Covid) Nasopharyngeal Swab     Status: None   Collection Time: 11/15/20  9:47 PM   Specimen: Nasopharyngeal Swab; Nasopharyngeal(NP) swabs in vial transport medium  Result Value Ref Range Status   SARS Coronavirus 2 by RT PCR NEGATIVE NEGATIVE Final    Comment: (NOTE) SARS-CoV-2 target nucleic acids are NOT DETECTED.  The SARS-CoV-2 RNA is generally detectable in upper respiratory specimens during the acute phase of infection. The lowest concentration of SARS-CoV-2 viral copies this assay can detect is 138 copies/mL. A negative result does not preclude SARS-Cov-2 infection and should not be used as the sole basis for treatment or other patient management decisions. A negative result may occur with  improper specimen collection/handling, submission of specimen other than nasopharyngeal swab, presence of viral mutation(s) within  the areas targeted by this assay, and inadequate number of viral copies(<138 copies/mL). A negative result must be combined with clinical observations, patient history, and epidemiological information. The expected result is Negative.  Fact Sheet for Patients:  EntrepreneurPulse.com.au  Fact Sheet for Healthcare Providers:  IncredibleEmployment.be  This test is no t yet approved or cleared by the [place] FDA and  has been authorized for detection and/or diagnosis of SARS-CoV-2 by FDA under an Emergency Use Authorization (EUA). This EUA will remain  in effect (meaning this test  can be used) for the duration of the COVID-19 declaration under Section 564(b)(1) of the Act, 21 U.S.C.section 360bbb-3(b)(1), unless the authorization is terminated  or revoked sooner.       Influenza A by PCR NEGATIVE NEGATIVE Final   Influenza B by PCR NEGATIVE NEGATIVE Final    Comment: (NOTE) The Xpert Xpress SARS-CoV-2/FLU/RSV plus assay is intended as an aid in the diagnosis of influenza from Nasopharyngeal swab specimens and should not be used as a sole basis for treatment. Nasal washings and aspirates are unacceptable for Xpert Xpress SARS-CoV-2/FLU/RSV testing.  Fact Sheet for Patients: EntrepreneurPulse.com.au  Fact Sheet for Healthcare Providers: IncredibleEmployment.be  This test is not yet approved or cleared by the [place] FDA and has been authorized for detection and/or diagnosis of SARS-CoV-2 by FDA under an Emergency Use Authorization (EUA). This EUA will remain in effect (meaning this test can be used) for the duration of the COVID-19 declaration under Section 564(b)(1) of the Act, 21 U.S.C. section 360bbb-3(b)(1), unless the authorization is terminated or revoked.  Performed at [facility], [place] [address] [place], [place] [address]          Radiology Studies: DG Chest 2  View  Result Date: 11/15/2020 CLINICAL DATA:  Shortness of breath.  Hospital discharge yesterday. EXAM: CHEST - 2 VIEW COMPARISON:  Radiograph 11/11/2020, CT 11/08/2020 FINDINGS: Stable cardiomediastinal contours post CABG. Broken sternal wires again seen. Right pleural effusions/pleural thickening with streaky opacities at the right lung base, similar. There is no pulmonary edema or pneumothorax. No acute airspace disease. Stable compression fracture in the lower thoracic spine. IMPRESSION: 1. No acute chest finding. 2. Stable right pleural effusion/pleural thickening and streaky basilar opacity. Electronically Signed   By: Keith Rake M.D.   On: 11/15/2020 16:54        Scheduled Meds:  allopurinol  300 mg Oral Daily   amLODipine  10 mg Oral Daily   aspirin EC  81 mg Oral Daily   escitalopram  20 mg Oral Daily   gabapentin  600 mg Oral QHS   insulin aspart  0-9 Units Subcutaneous Q4H   lisinopril  2.5 mg Oral Daily   omega-3 acid ethyl esters  1 g Oral Q0600   pravastatin  40 mg Oral q1800   ticagrelor  90 mg Oral BID   Continuous Infusions:  heparin 1,800 Units/hr (11/16/20 0608)     LOS: 0 days    Time spent: 66mn  PDomenic Polite MD [facility]   11/16/2020, 10:29 AM

## 2020-11-16 NOTE — Progress Notes (Signed)
Progress Note  Patient Name: Tony Williams Date of Encounter: 11/16/2020  [facility] Cardiologist: Kate Sable, MD   Subjective   Feeling much better today.  At rest with oxygen he has no dyspnea at all.  Slept well. He reports edema yesterday, but has none today, despite not receiving any diuretics. His dyspnea occurred randomly, at rest prior to admission, on 1 occasion just 30 minutes after taking his medications including Brilinta.  However, he also noticed slightly labored breathing with all activities while at home.  Inpatient Medications    Scheduled Meds:  allopurinol  300 mg Oral Daily   amLODipine  10 mg Oral Daily   aspirin EC  81 mg Oral Daily   escitalopram  20 mg Oral Daily   gabapentin  600 mg Oral QHS   insulin aspart  0-9 Units Subcutaneous Q4H   lisinopril  2.5 mg Oral Daily   omega-3 acid ethyl esters  1 g Oral Q0600   pravastatin  40 mg Oral q1800   ticagrelor  90 mg Oral BID   Continuous Infusions:  heparin 1,800 Units/hr (11/16/20 0608)   PRN Meds: albuterol, HYDROcodone-acetaminophen, nitroGLYCERIN, traZODone   Vital Signs    Vitals:   11/15/20 2327 11/16/20 0010 11/16/20 0410 11/16/20 0847  BP: 115/73 (!) 128/47 (!) 126/55 137/77  Pulse: (!) 107 (!) 105 77 66  Resp: '18 19 18 17  '$ Temp: 98.9 F (37.2 C) 98.7 F (37.1 C) 98.6 F (37 C)   TempSrc: Axillary Oral Oral   SpO2: 94% 95% 98% 96%  Weight:      Height:        Intake/Output Summary (Last 24 hours) at 11/16/2020 1151 Last data filed at 11/16/2020 0934 Gross per 24 hour  Intake --  Output 325 ml  Net -325 ml   Last 3 Weights 11/15/2020 11/14/2020 11/13/2020  Weight (lbs) 267 lb 13.7 oz 270 lb 15.1 oz 268 lb 11.9 oz  Weight (kg) 121.5 kg 122.9 kg 121.9 kg      Telemetry    Sinus rhythm, occasional mild sinus bradycardia, rare PVCs- Personally Reviewed  ECG    Sinus rhythm, nonspecific intraventricular conduction abnormality/atypical left bundle branch block, diffuse ST  segment depression T wave inversion, not much change from previous tracing- Personally Reviewed  Physical Exam  Morbidly obese GEN: No acute distress.   Neck: No JVD Cardiac: RRR, no murmurs, rubs, or gallops.  Respiratory: Clear to auscultation bilaterally. GI: Soft, nontender, non-distended  MS: No edema; No deformity. Neuro:  Nonfocal  Psych: Normal affect   Labs    High Sensitivity Troponin:   Recent Labs  Lab 11/10/20 0917 11/15/20 1618 11/15/20 1819 11/15/20 2240 11/16/20 0221  TROPONINIHS 1,117* 539* 625* 366* 353*      Chemistry Recent Labs  Lab 11/14/20 0046 11/15/20 1618 11/15/20 2240  NA 137 136 138  K 4.5 4.5 3.7  CL 102 101 102  CO2 '31 26 23  '$ GLUCOSE 161* 159* 189*  BUN 23 24* 23  CREATININE 1.95* 1.80* 1.98*  CALCIUM 8.7* 9.6 9.5  PROT  --   --  6.6  ALBUMIN  --   --  3.1*  AST  --   --  21  ALT  --   --  18  ALKPHOS  --   --  41  BILITOT  --   --  0.9  GFRNONAA 35* 39* 35*  ANIONGAP 4* 9 13     Hematology Recent Labs  Lab 11/13/20  0106 11/15/20 1618 11/15/20 2240  WBC 10.1 10.5 10.8*  RBC 4.39 4.68 4.59  HGB 13.0 13.9 13.6  HCT 41.6 44.1 42.5  MCV 94.8 94.2 92.6  MCH 29.6 29.7 29.6  MCHC 31.3 31.5 32.0  RDW 15.1 14.9 15.0  PLT 137* 174 162    BNP Recent Labs  Lab 11/15/20 1618  BNP 283.1*     DDimer  Recent Labs  Lab 11/15/20 1618  DDIMER 0.95*     Radiology    DG Chest 2 View  Result Date: 11/15/2020 CLINICAL DATA:  Shortness of breath.  Hospital discharge yesterday. EXAM: CHEST - 2 VIEW COMPARISON:  Radiograph 11/11/2020, CT 11/08/2020 FINDINGS: Stable cardiomediastinal contours post CABG. Broken sternal wires again seen. Right pleural effusions/pleural thickening with streaky opacities at the right lung base, similar. There is no pulmonary edema or pneumothorax. No acute airspace disease. Stable compression fracture in the lower thoracic spine. IMPRESSION: 1. No acute chest finding. 2. Stable right pleural  effusion/pleural thickening and streaky basilar opacity. Electronically Signed   By: Keith Rake M.D.   On: 11/15/2020 16:54    Cardiac Studies  Echocardiogram 05/22/2020 Left ventricular ejection fraction, by estimation, is 50 to 55%. The left ventricle has low normal function. The left ventricle demonstrates regional wall motion abnormalities (apical hypokinesis). The left ventricular internal cavity size was mildly dilated. Left ventricular diastolic parameters are consistent with Grade II diastolic dysfunction (pseudonormalization).  Right ventricular systolic function was not well visualized. The right ventricular size is not well visualized.  Left atrial size was moderately dilated.  Challenging images  Cardiac catheterization/PCI 11/12/2020    Dist LM lesion is 90% stenosed.   Mid LAD lesion is 100% stenosed.   Ost Cx to Prox Cx lesion is 100% stenosed.   Dist Graft lesion before 2nd Diag  is 99% stenosed.   Prox Graft lesion between 2nd Diag and Mid LAD  is 90% stenosed.   1st Diag lesion is 100% stenosed.   Insertion lesion before 2nd Diag  is 90% stenosed.   A drug-eluting stent was successfully placed using a STENT ONYX FRONTIER 4.0X15.   A drug-eluting stent was successfully placed using a STENT ONYX FRONTIER 4.0X15.   Post intervention, there is a 0% residual stenosis.   Post intervention, there is a 0% residual stenosis.   SVG and is large.   Successful drug-eluting stent placement to the mid SVG to LAD/diagonal using distal protection device. Successful drug-eluting stent placement to the distal SVG to LAD/diagonal without distal protection device due to distal location.   Recommendations: Dual antiplatelet therapy indefinitely. Aggressive treatment of risk factors. Recommend removing intra-aortic balloon pump at 8:30 PM to allow washout of heparin.  Mean PCW P 11 mmHg PAP 46/16 (mean 26) millimeters Hg Fick cardiac output 7.95 L/min (index 3.42 L/min/m  sq)  Patient Profile     75 y.o. male with a hx of coronary artery disease status post CABG and multiple PCI, type 2 diabetes, CKD 3, hypertension, hyperlipidemia, obesity, OSA.  Only discharged after non-STEMI treated with 2 drug-eluting stents to the SVG to LAD-diagonal on 11/11/2020, returns 1 day after discharge with complaints of shortness of breath at rest.  Assessment & Plan    Dyspnea: Agree that his shortness of breath is most likely related to treatment with ticagrelor. Suggested drinking a caffeinated beverage if the symptoms recur, to see if this leads to rapid improvement. However, he also has some complaints of exertional dyspnea and transient edema.  We  will repeat his echocardiogram to see if there is any evidence of wall motion abnormalities in the territory of the recently stented SVG-LAD/diagonal.  CHF is considered.  BNP is nominally higher 283 versus 138 on 11/08/2020),  but he had normal filling pressures at the time of his cardiac catheterization on 08/30 and weighs only 1 kg more than at the time of his cardiac catheterization.  No objective findings of hypervolemia on exam today.  CAD: Recent non-STEMI and SVG to LAD-diagonal troponin level is still trending down from his previous admission. No acute changes on ECG or anginal chest pain.  Doubt new acute coronary event.  CKD: Renal function is moderately abnormal with a creatinine of 1.95, slightly worse than his baseline which seems to be around 1.6-1.7.  Baseline GFR around 40.  Try to avoid contrast based procedures if possible.   If his echocardiogram is normal and his symptoms are controlled with as needed caffeine consumption, can go home without change in therapy.  If shortness of breath becomes intolerable, will switch to clopidogrel, but ticagrelor is preferred in the setting of acute coronary syndrome and SVG revascularization.  Consider switching to clopidogrel after 3 months if he can tolerate the side effects until  then.     For questions or updates, please contact [place] Please consult [email or web address] for contact info under        Signed, Sanda Klein, MD  11/16/2020, 11:51 AM

## 2020-11-16 NOTE — Progress Notes (Signed)
  Echocardiogram 2D Echocardiogram with contrast has been performed.  Tony Williams F 11/16/2020, 2:40 PM

## 2020-11-17 NOTE — Discharge Summary (Addendum)
Physician Discharge Summary  Ferd Horrigan DQQ:[id number] DOB: 1945/10/31 DOA: 11/15/2020  PCP: Virginia Crews, MD  Admit date: 11/15/2020 Discharge date: 11/16/2020  Time spent: 35 minutes  Recommendations for Outpatient Follow-up:  Cardiology Dr.  Kate Sable in 2 weeks   Discharge Diagnoses:  Principal Problem:   Dyspnea Active Problems:   Chronic obstructive pulmonary disease ([place])   Controlled type 2 diabetes mellitus with diabetic polyneuropathy, without long-term current use of insulin ([place])   Coronary artery disease with hx of myocardial infarct w/o hx of CABG   S/P primary angioplasty with coronary stent   Stage 3a chronic kidney disease ([place])   Chronic gout due to renal impairment without tophus   Discharge Condition: stable  Diet recommendation: Carb modified, heart healthy  Filed Weights   11/15/20 2238  Weight: 121.5 kg    History of present illness:  Tony Williams is a 75 y.o. male with history of CAD status post CABG status post stenting and recent admission for non-ST elevation MI underwent stenting x2, of mid and distal SVG to LAD on 8/31, he was discharged home on 9/2, started on Brilinta the day of discharge.  Experienced some dyspnea that evening, felt well the rest of the day, Saturday 9/3 AM woke up feeling well took his medicines and about 30 minutes later started experiencing acute shortness of breath at rest.  He also had some chest pain/discomfort  -In the ED his vitals were stable, chest x-ray was unremarkable, COVID-19 PCR was negative, high-sensitivity troponin was 6-5 from 539, BNP was 283, EKG noted tachycardia and inferior ST depressions   Hospital Course:   Dyspnea -Clinically appears euvolemic, suspicion for ACS is low -we suspect his symptoms were secondary to Brilinta, he was started on this 9/2 the day of discharge, he was able to tolerate this inpatient without significant issues -ECHo was completed and noted normal LVEF and  wall motion -discussed with cardiology, discharged home in a stable condition with close FU with Cards clinic in [place], if he has recurrence of symptoms could be switched to plavix, this was not felt necessary at this time.   NSTEMI/CAD/CABG -Discharged on 9/2 following stenting of mid and distal SVG to LAD, -Known multivessel CAD, not felt to be a candidate for redo CABG -Was taken off beta-blockers last week due to bradycardia -Continue aspirin, statin, Brillinta, see discussion above   Hypertension -Stable, continue amlodipine and lisinopril   CKD 3a -Creatinine stable   Type 2 diabetes mellitus -CBGs are stable, hemoglobin A1c was 6.4, continue sliding scale      Consultants:  Cardiology  Discharge Exam: Vitals:   11/16/20 0847 11/16/20 1527  BP: 137/77 (!) 160/66  Pulse: 66 63  Resp: 17 18  Temp:  98.3 F (36.8 C)  SpO2: 96% 93%    General: AAOx3 Cardiovascular: S1S2/RRR Respiratory: CTAB  Discharge Instructions   Discharge Instructions     Diet - low sodium heart healthy   Complete by: As directed    Increase activity slowly   Complete by: As directed       Allergies as of 11/16/2020       Reactions   Butorphanol Itching   Morphine Other (See Comments)   Redness around IV site   Pedi-pre Tape Spray [wound Dressing Adhesive] Itching   Simvastatin Other (See Comments)   "Destroyed my muscles"   Statins Other (See Comments)   "Destroyed my muscles"        Medication List  STOP taking these medications    HYDROcodone-acetaminophen 5-325 MG tablet Commonly known as: Norco       TAKE these medications    Advair HFA 230-21 MCG/ACT inhaler Generic drug: fluticasone-salmeterol Inhale 1-2 puffs into the lungs 2 (two) times daily. Rinse mouth after each use.   albuterol 108 (90 Base) MCG/ACT inhaler Commonly known as: VENTOLIN HFA Inhale 2 puffs into the lungs every 6 (six) hours as needed for wheezing or shortness of breath.    allopurinol 300 MG tablet Commonly known as: ZYLOPRIM Take 1 tablet (300 mg total) by mouth daily.   amLODipine 10 MG tablet Commonly known as: NORVASC Take 1 tablet (10 mg total) by mouth daily.   aspirin EC 81 MG tablet Take 81 mg by mouth daily. Swallow whole.   blood glucose meter kit and supplies Dispense based on patient and insurance preference. Use up to four times daily as directed. (FOR ICD-10 E10.9, E11.9). check blood glucose fasting and before meals four times daily.   Brilinta 90 MG Tabs tablet Generic drug: ticagrelor Take 1 tablet (90 mg total) by mouth 2 (two) times daily.   Co Q-10 400 MG Caps Take by mouth daily at 6 (six) AM.   desoximetasone 0.25 % cream Commonly known as: TOPICORT Apply 1 application topically 2 (two) times daily.   escitalopram 20 MG tablet Commonly known as: LEXAPRO Take 1 tablet (20 mg total) by mouth daily.   Fish Oil 1200 MG Caps Take by mouth daily at 6 (six) AM.   gabapentin 300 MG capsule Commonly known as: NEURONTIN Take 2 capsules (600 mg total) by mouth at bedtime.   lisinopril 5 MG tablet Commonly known as: ZESTRIL Take 0.5 tablets (2.5 mg total) by mouth daily.   lovastatin 40 MG tablet Commonly known as: MEVACOR Take 40 mg by mouth at bedtime.   nitroGLYCERIN 0.4 MG SL tablet Commonly known as: NITROSTAT Place 1 tablet (0.4 mg total) under the tongue every 5 (five) minutes x 3 doses as needed for chest pain.   Repatha SureClick 915 MG/ML [place] Generic drug: Evolocumab Inject 1 pen into the skin every 14 (fourteen) days.   traZODone 150 MG tablet Commonly known as: DESYREL Take 1-2 tablets (150-300 mg total) by mouth at bedtime as needed for sleep.   VITAMIN D3 PO Take 4,000 Units by mouth.       Allergies  Allergen Reactions   Butorphanol Itching   Morphine Other (See Comments)    Redness around IV site   Pedi-Pre Tape Spray [Wound Dressing Adhesive] Itching   Simvastatin Other (See Comments)     "Destroyed my muscles"   Statins Other (See Comments)    "Destroyed my muscles"      The results of significant diagnostics from this hospitalization (including imaging, microbiology, ancillary and laboratory) are listed below for reference.    Significant Diagnostic Studies: DG Chest 2 View  Result Date: 11/15/2020 CLINICAL DATA:  Shortness of breath.  Hospital discharge yesterday. EXAM: CHEST - 2 VIEW COMPARISON:  Radiograph 11/11/2020, CT 11/08/2020 FINDINGS: Stable cardiomediastinal contours post CABG. Broken sternal wires again seen. Right pleural effusions/pleural thickening with streaky opacities at the right lung base, similar. There is no pulmonary edema or pneumothorax. No acute airspace disease. Stable compression fracture in the lower thoracic spine. IMPRESSION: 1. No acute chest finding. 2. Stable right pleural effusion/pleural thickening and streaky basilar opacity. Electronically Signed   By: Keith Rake M.D.   On: 11/15/2020 16:54   CT Angio  Chest PE W and/or Wo Contrast  Result Date: 11/08/2020 CLINICAL DATA:  75 year old male with central chest pain radiating to the back and left shoulder blade. EXAM: CT ANGIOGRAPHY CHEST WITH CONTRAST TECHNIQUE: Multidetector CT imaging of the chest was performed using the standard protocol during bolus administration of intravenous contrast. Multiplanar CT image reconstructions and MIPs were obtained to evaluate the vascular anatomy. CONTRAST:  158m OMNIPAQUE IOHEXOL 350 MG/ML SOLN COMPARISON:  Portable chest 0442 hours today. FINDINGS: Cardiovascular: Good contrast bolus timing in the pulmonary arterial tree. No focal filling defect identified in the pulmonary arteries to suggest acute pulmonary embolism. Prior sternotomy. Calcified aortic atherosclerosis. Cardiomegaly. No pericardial effusion. Mediastinum/Nodes: Postoperative changes with no mediastinal mass or lymphadenopathy. Maximal and mildly asymmetric right hilar lymph nodes.  Lungs/Pleura: Volume loss in the right hemithorax with posterior and costophrenic angle pleural thickening and/or intermediate density small pleural collection (series 4, image 84) with faint calcification. Associated right lower lobe basal segment atelectasis and mild architectural distortion. Major airways remain patent with atelectatic changes. Scattered subpleural scarring in both lungs. No left pleural effusion. Upper Abdomen: Absent gallbladder. Negative visible noncontrast liver, spleen, pancreas, adrenal glands and bowel in the upper abdomen. Partially visible atrophied left kidney. Musculoskeletal: Chronic but ununited sternotomy. T12 wedge compression fracture with largely obliterated T11-T12 disc space, but the T11 inferior endplate remains intact. Interbody ankylosis there and at other thoracic levels in part related to flowing endplate osteophytes. No acute or suspicious osseous lesion identified. Review of the MIP images confirms the above findings. IMPRESSION: 1. Negative for acute pulmonary embolus. 2. Right lung volume loss with lower lobe fibrothorax, or less likely small pleural empyema. Associated right lung atelectasis and architectural distortion. Mild atelectasis and pulmonary scarring elsewhere. 3. Cardiomegaly.  Aortic Atherosclerosis (ICD10-I70.0). 4. Chronic T12 compression fracture. Electronically Signed   By: HGenevie AnnM.D.   On: 11/08/2020 07:30   CARDIAC CATHETERIZATION  Result Date: 11/12/2020   Dist LM lesion is 90% stenosed.   Mid LAD lesion is 100% stenosed.   Ost Cx to Prox Cx lesion is 100% stenosed.   Dist Graft lesion before 2nd Diag  is 99% stenosed.   Prox Graft lesion between 2nd Diag and Mid LAD  is 90% stenosed.   1st Diag lesion is 100% stenosed.   Insertion lesion before 2nd Diag  is 90% stenosed.   A drug-eluting stent was successfully placed using a STENT ONYX FRONTIER 4.0X15.   A drug-eluting stent was successfully placed using a STENT ONYX FRONTIER 4.0X15.   Post  intervention, there is a 0% residual stenosis.   Post intervention, there is a 0% residual stenosis.   SVG and is large. Successful drug-eluting stent placement to the mid SVG to LAD/diagonal using distal protection device. Successful drug-eluting stent placement to the distal SVG to LAD/diagonal without distal protection device due to distal location. Recommendations: Dual antiplatelet therapy indefinitely. Aggressive treatment of risk factors. Recommend removing intra-aortic balloon pump at 8:30 PM to allow washout of heparin.   CARDIAC CATHETERIZATION  Result Date: 11/11/2020 Conclusions: Severe native coronary artery disease including 90% distal LMCA stenosis, 100% mid LAD occlusion, 100% ostial LCx occlusion, and severe diffuse RCA disease. Widely patent LIMA-D1. Patent sequential SVG-D2-LAD with 99% mid graft stenosis as well as 90% stenosis at the LIMA-D2 anastomosis. Chronically occluded, previously stented SVG-OM. Mildly elevated left and right heart filling pressures. Moderate pulmonary hypertension. Normal-supranormal Fick cardiac output/index. Successful placement of 50 mL intra-aortic balloon pump via the right common femoral artery.  Recommendations: Images reviewed with Dr. Fletcher Anon.  We agree that patient is high risk for revascularization given his severe native and graft disease.  His distal targets are small and diffusely diseased, making them suboptimal for redo-CABG.  We will transfer to [facility] for possible high risk PCI to SVG-D2-LAD tomorrow. Titrate IV nitroglycerin for relief of chest pain. Aggressive secondary prevention. Nelva Bush, MD [facility]  DG CHEST PORT 1 VIEW  Result Date: 11/11/2020 CLINICAL DATA:  Intra-aortic balloon pump EXAM: PORTABLE CHEST 1 VIEW COMPARISON:  11/08/2020 FINDINGS: Post sternotomy changes with redemonstrated fractures through multiple sternal wires. Cardiomegaly with vascular congestion and mild pulmonary edema. Small right-sided pleural  effusion. Interim placement of intra-aortic balloon pump, slightly low positioning of radiopaque tip which is visible about 6 cm inferior to the aortic arch. No pneumothorax is seen IMPRESSION: 1. Low positioning of intra-aortic balloon pump with radiopaque tip about 6 cm inferior to the aortic arch 2. Cardiomegaly with vascular congestion, pulmonary edema and small right pleural effusion These results will be called to the ordering clinician or representative by the Radiologist Assistant, and communication documented in the PACS or [employer]. Electronically Signed   By: Donavan Foil M.D.   On: 11/11/2020 22:22   DG Chest Port 1 View  Result Date: 11/08/2020 CLINICAL DATA:  Chest pain and hypoxemia EXAM: PORTABLE CHEST 1 VIEW COMPARISON:  None. FINDINGS: Previous median sternotomy for CABG procedure. Mild cardiac enlargement. Mild diffuse interstitial edema identified. No airspace opacities. Small right pleural effusion suspected. IMPRESSION: Mild congestive heart failure. Electronically Signed   By: Kerby Moors M.D.   On: 11/08/2020 05:23   ECHOCARDIOGRAM COMPLETE  Result Date: 11/09/2020    ECHOCARDIOGRAM REPORT   Patient Name:   Tony Williams Date of Exam: 11/08/2020 Medical Rec #:  [id number]        Height:       69.0 in Accession #:    [id number]       Weight:       271.1 lb Date of Birth:  02/05/1946        BSA:          2.351 m Patient Age:    89 years         BP:           128/57 mmHg Patient Gender: M                HR:           53 bpm. Exam Location:  [facility] Procedure: 2D Echo and Intracardiac Opacification Agent Indications:     NSTEMI I21.4  History:         Patient has prior history of Echocardiogram examinations, most                  recent 05/22/2020.  Sonographer:     Kathlen Brunswick RDCS Referring Phys:  [id number] [place] Diagnosing Phys: Ida Rogue MD  Sonographer Comments: Technically difficult study due to poor echo windows, suboptimal subcostal window, suboptimal apical  window and patient is morbidly obese. IMPRESSIONS  1. Left ventricular ejection fraction, by estimation, is 50 to 55%. The left ventricle has low normal function. The left ventricle demonstrates regional wall motion abnormalities (apical hypokinesis). The left ventricular internal cavity size was mildly  dilated. Left ventricular diastolic parameters are consistent with Grade II diastolic dysfunction (pseudonormalization).  2. Right ventricular systolic function was not well visualized. The right ventricular size is not well visualized.  3. Left atrial size was moderately dilated.  4. Challenging images FINDINGS  Left Ventricle: Left ventricular ejection fraction, by estimation, is 50 to 55%. The left ventricle has low normal function. The left ventricle demonstrates regional wall motion abnormalities. The left ventricular internal cavity size was mildly dilated. There is no left ventricular hypertrophy. Left ventricular diastolic parameters are consistent with Grade II diastolic dysfunction (pseudonormalization). Right Ventricle: The right ventricular size is not well visualized. Right vetricular wall thickness was not well visualized. Right ventricular systolic function was not well visualized. Left Atrium: Left atrial size was moderately dilated. Right Atrium: Right atrial size was normal in size. Pericardium: There is no evidence of pericardial effusion. Mitral Valve: The mitral valve was not well visualized. No evidence of mitral valve regurgitation. No evidence of mitral valve stenosis. Tricuspid Valve: The tricuspid valve is not well visualized. Tricuspid valve regurgitation is not demonstrated. No evidence of tricuspid stenosis. Aortic Valve: The aortic valve was not well visualized. Aortic valve regurgitation is mild. No aortic stenosis is present. Aortic valve peak gradient measures 4.8 mmHg. Pulmonic Valve: The pulmonic valve was not well visualized. Pulmonic valve regurgitation is not visualized. No  evidence of pulmonic stenosis. Aorta: The aortic arch was not well visualized and the ascending aorta was not well visualized. Venous: The pulmonary veins were not well visualized. The inferior vena cava was not well visualized. The inferior vena cava is normal in size with greater than 50% respiratory variability, suggesting right atrial pressure of 3 mmHg. IAS/Shunts: No atrial level shunt detected by color flow Doppler.  LEFT VENTRICLE PLAX 2D LVIDd:         6.20 cm  Diastology LVIDs:         5.20 cm  LV e' medial:  5.87 cm/s LV PW:         1.10 cm  LV e' lateral: 7.18 cm/s LV IVS:        0.80 cm LVOT diam:     2.10 cm LV SV:         74 LV SV Index:   32 LVOT Area:     3.46 cm  LEFT ATRIUM             Index LA diam:        6.00 cm 2.55 cm/m LA Vol (A2C):   40.1 ml 17.06 ml/m LA Vol (A4C):   43.1 ml 18.33 ml/m LA Biplane Vol: 42.4 ml 18.03 ml/m  AORTIC VALVE AV Area (Vmax): 3.37 cm AV Vmax:        109.00 cm/s AV Peak Grad:   4.8 mmHg LVOT Vmax:      106.00 cm/s LVOT Vmean:     61.200 cm/s LVOT VTI:       0.215 m  AORTA Ao Root diam: 3.40 cm  SHUNTS Systemic VTI:  0.22 m Systemic Diam: 2.10 cm Ida Rogue MD Electronically signed by Ida Rogue MD Signature Date/Time: 11/09/2020/10:33:40 AM    Final    ECHOCARDIOGRAM LIMITED  Result Date: 11/16/2020    ECHOCARDIOGRAM LIMITED REPORT   Patient Name:   Tony Williams Date of Exam: 11/16/2020 Medical Rec #:  [id number]        Height:       69.0 in Accession #:    [id number]       Weight:       267.9 lb Date of Birth:  May 30, 1945        BSA:  2.339 m Patient Age:    75 years         BP:           157/61 mmHg Patient Gender: M                HR:           60 bpm. Exam Location:  Inpatient Procedure: 2D Echo, Limited Echo and Intracardiac Opacification Agent Indications:    R06.02 SOB  History:        Patient has prior history of Echocardiogram examinations, most                 recent 11/08/2020. Previous Myocardial Infarction, Prior CABG;                  Signs/Symptoms:Shortness of Breath.  Sonographer:    Merrie Roof RDCS Referring Phys: [place]  1. Left ventricular ejection fraction, by estimation, is 60 to 65%. The left ventricle has normal function. The left ventricle has no regional wall motion abnormalities. Left ventricular diastolic function could not be evaluated.  2. The right ventricular size is normal. Comparison(s): A prior study was performed on 11/08/2020. Prior images reviewed side by side. Limited study for left ventricular function. The apical wall motion abnormality has resolved and the overall left ventricular function has improved. FINDINGS  Left Ventricle: Left ventricular ejection fraction, by estimation, is 60 to 65%. The left ventricle has normal function. The left ventricle has no regional wall motion abnormalities. Definity contrast agent was given IV to delineate the left ventricular  endocardial borders. Left ventricular diastolic function could not be evaluated. Right Ventricle: The right ventricular size is normal. Pericardium: There is no evidence of pericardial effusion. Sanda Klein MD Electronically signed by Sanda Klein MD Signature Date/Time: 11/16/2020/2:54:06 PM    Final     Microbiology: Recent Results (from the past 240 hour(s))  Resp Panel by RT-PCR (Flu A&B, Covid) Nasopharyngeal Swab     Status: None   Collection Time: 11/08/20  4:39 AM   Specimen: Nasopharyngeal Swab; Nasopharyngeal(NP) swabs in vial transport medium  Result Value Ref Range Status   SARS Coronavirus 2 by RT PCR NEGATIVE NEGATIVE Final    Comment: (NOTE) SARS-CoV-2 target nucleic acids are NOT DETECTED.  The SARS-CoV-2 RNA is generally detectable in upper respiratory specimens during the acute phase of infection. The lowest concentration of SARS-CoV-2 viral copies this assay can detect is 138 copies/mL. A negative result does not preclude SARS-Cov-2 infection and should not be used as the sole basis for  treatment or other patient management decisions. A negative result may occur with  improper specimen collection/handling, submission of specimen other than nasopharyngeal swab, presence of viral mutation(s) within the areas targeted by this assay, and inadequate number of viral copies(<138 copies/mL). A negative result must be combined with clinical observations, patient history, and epidemiological information. The expected result is Negative.  Fact Sheet for Patients:  EntrepreneurPulse.com.au  Fact Sheet for Healthcare Providers:  IncredibleEmployment.be  This test is no t yet approved or cleared by the [place] FDA and  has been authorized for detection and/or diagnosis of SARS-CoV-2 by FDA under an Emergency Use Authorization (EUA). This EUA will remain  in effect (meaning this test can be used) for the duration of the COVID-19 declaration under Section 564(b)(1) of the Act, 21 U.S.C.section 360bbb-3(b)(1), unless the authorization is terminated  or revoked sooner.       Influenza A by PCR NEGATIVE  NEGATIVE Final   Influenza B by PCR NEGATIVE NEGATIVE Final    Comment: (NOTE) The Xpert Xpress SARS-CoV-2/FLU/RSV plus assay is intended as an aid in the diagnosis of influenza from Nasopharyngeal swab specimens and should not be used as a sole basis for treatment. Nasal washings and aspirates are unacceptable for Xpert Xpress SARS-CoV-2/FLU/RSV testing.  Fact Sheet for Patients: EntrepreneurPulse.com.au  Fact Sheet for Healthcare Providers: IncredibleEmployment.be  This test is not yet approved or cleared by the [place] FDA and has been authorized for detection and/or diagnosis of SARS-CoV-2 by FDA under an Emergency Use Authorization (EUA). This EUA will remain in effect (meaning this test can be used) for the duration of the COVID-19 declaration under Section 564(b)(1) of the Act, 21  U.S.C. section 360bbb-3(b)(1), unless the authorization is terminated or revoked.  Performed at [facility], [address] [place], [place] [address]   MRSA Next Gen by PCR, Nasal     Status: None   Collection Time: 11/11/20 11:13 PM   Specimen: Nasal Mucosa; Nasal Swab  Result Value Ref Range Status   MRSA by PCR Next Gen NOT DETECTED NOT DETECTED Final    Comment: (NOTE) The GeneXpert MRSA Assay (FDA approved for NASAL specimens only), is one component of a comprehensive MRSA colonization surveillance program. It is not intended to diagnose MRSA infection nor to guide or monitor treatment for MRSA infections. Test performance is not FDA approved in patients less than 39 years old. Performed at [facility], [place] [address], [place],  [address]   Resp Panel by RT-PCR (Flu A&B, Covid) Nasopharyngeal Swab     Status: None   Collection Time: 11/15/20  9:47 PM   Specimen: Nasopharyngeal Swab; Nasopharyngeal(NP) swabs in vial transport medium  Result Value Ref Range Status   SARS Coronavirus 2 by RT PCR NEGATIVE NEGATIVE Final    Comment: (NOTE) SARS-CoV-2 target nucleic acids are NOT DETECTED.  The SARS-CoV-2 RNA is generally detectable in upper respiratory specimens during the acute phase of infection. The lowest concentration of SARS-CoV-2 viral copies this assay can detect is 138 copies/mL. A negative result does not preclude SARS-Cov-2 infection and should not be used as the sole basis for treatment or other patient management decisions. A negative result may occur with  improper specimen collection/handling, submission of specimen other than nasopharyngeal swab, presence of viral mutation(s) within the areas targeted by this assay, and inadequate number of viral copies(<138 copies/mL). A negative result must be combined with clinical observations, patient history, and epidemiological information. The expected result is Negative.  Fact Sheet for  Patients:  EntrepreneurPulse.com.au  Fact Sheet for Healthcare Providers:  IncredibleEmployment.be  This test is no t yet approved or cleared by the [place] FDA and  has been authorized for detection and/or diagnosis of SARS-CoV-2 by FDA under an Emergency Use Authorization (EUA). This EUA will remain  in effect (meaning this test can be used) for the duration of the COVID-19 declaration under Section 564(b)(1) of the Act, 21 U.S.C.section 360bbb-3(b)(1), unless the authorization is terminated  or revoked sooner.       Influenza A by PCR NEGATIVE NEGATIVE Final   Influenza B by PCR NEGATIVE NEGATIVE Final    Comment: (NOTE) The Xpert Xpress SARS-CoV-2/FLU/RSV plus assay is intended as an aid in the diagnosis of influenza from Nasopharyngeal swab specimens and should not be used as a sole basis for treatment. Nasal washings and aspirates are unacceptable for Xpert Xpress SARS-CoV-2/FLU/RSV testing.  Fact  Sheet for Patients: EntrepreneurPulse.com.au  Fact Sheet for Healthcare Providers: [email or web address]  This test is not yet approved or cleared by the [place] FDA and has been authorized for detection and/or diagnosis of SARS-CoV-2 by FDA under an Emergency Use Authorization (EUA). This EUA will remain in effect (meaning this test can be used) for the duration of the COVID-19 declaration under Section 564(b)(1) of the Act, 21 U.S.C. section 360bbb-3(b)(1), unless the authorization is terminated or revoked.  Performed at [facility], [place] [address] [place], [place] [address]      Labs: Basic Metabolic Panel: Recent Labs  Lab 11/12/20 0134 11/13/20 0106 11/14/20 0046 11/15/20 1618 11/15/20 2240  NA 138 138 137 136 138  K 4.4 4.5 4.5 4.5 3.7  CL 102 102 102 101 102  CO2 _0 GLUCOSE 137* 152* 161* 159* 189*  BUN _1 24* 23  CREATININE 1.77* 1.89* 1.95*  1.80* 1.98*  CALCIUM 8.9 8.4* 8.7* 9.6 9.5   Liver Function Tests: Recent Labs  Lab 11/15/20 2240  AST 21  ALT 18  ALKPHOS 41  BILITOT 0.9  PROT 6.6  ALBUMIN 3.1*   No results for input(s): LIPASE, AMYLASE in the last 168 hours. No results for input(s): AMMONIA in the last 168 hours. CBC: Recent Labs  Lab 11/11/20 0103 11/12/20 0134 11/13/20 0106 11/15/20 1618 11/15/20 2240  WBC 8.8 8.8 10.1 10.5 10.8*  NEUTROABS  --   --   --  7.8*  --   HGB 14.4 13.9 13.0 13.9 13.6  HCT 44.8 43.4 41.6 44.1 42.5  MCV 91.8 94.1 94.8 94.2 92.6  PLT 171 151 137* 174 162   Cardiac Enzymes: No results for input(s): CKTOTAL, CKMB, CKMBINDEX, TROPONINI in the last 168 hours. BNP: BNP (last 3 results) Recent Labs    11/08/20 0558 11/15/20 1618  BNP 138.7* 283.1*    ProBNP (last 3 results) No results for input(s): PROBNP in the last 8760 hours.  CBG: Recent Labs  Lab 11/16/20 0009 11/16/20 0409 11/16/20 0740 11/16/20 1241 11/16/20 1546  GLUCAP 228* 175* 152* 202* 126*       Signed:  Domenic Polite MD.  [facility] 11/17/2020, 1:44 PM

## 2020-11-20 ENCOUNTER — Encounter: Payer: Self-pay | Admitting: Cardiology

## 2020-11-20 ENCOUNTER — Other Ambulatory Visit: Payer: Self-pay

## 2020-11-20 ENCOUNTER — Ambulatory Visit: Payer: Medicare Other | Admitting: Cardiology

## 2020-11-20 VITALS — BP 110/50 | HR 57 | Ht 69.0 in | Wt 263.0 lb

## 2020-11-20 DIAGNOSIS — E7800 Pure hypercholesterolemia, unspecified: Secondary | ICD-10-CM | POA: Diagnosis not present

## 2020-11-20 DIAGNOSIS — R0602 Shortness of breath: Secondary | ICD-10-CM | POA: Diagnosis not present

## 2020-11-20 DIAGNOSIS — N189 Chronic kidney disease, unspecified: Secondary | ICD-10-CM

## 2020-11-20 DIAGNOSIS — I2510 Atherosclerotic heart disease of native coronary artery without angina pectoris: Secondary | ICD-10-CM | POA: Diagnosis not present

## 2020-11-20 DIAGNOSIS — I10 Essential (primary) hypertension: Secondary | ICD-10-CM

## 2020-11-20 MED ORDER — TORSEMIDE 20 MG PO TABS: 20.0000 mg | ORAL_TABLET | Freq: Two times a day (BID) | ORAL | 3 refills | Status: DC

## 2020-11-20 MED ORDER — AMLODIPINE BESYLATE 10 MG PO TABS: 5.0000 mg | ORAL_TABLET | Freq: Every day | ORAL | 1 refills | Status: DC

## 2020-11-20 MED ORDER — NEXLIZET 180-10 MG PO TABS: 1.0000 | ORAL_TABLET | Freq: Every day | ORAL | 11 refills | Status: DC

## 2020-11-20 NOTE — Patient Instructions (Signed)
Medication Instructions:   Your physician has recommended you make the following change in your medication:    REDUCE your Norvasc (Amlodipine) to 5 MG once a day.  2.    START taking Torsemide 20 MG once a day.  3.     START taking Nexlizet (Bempedoic Acid-Ezetimibe) 180-10 MG once a day.  4.    STOP taking  your Repatha.   *If you need a refill on your cardiac medications before your next appointment, please call your pharmacy*   Lab Work:  Please return to our office for a lab draw (BMP) in one week on __________at___________am/pm   Testing/Procedures: None ordered   Follow-Up: At [facility], you and your health needs are our priority.  As part of our continuing mission to provide you with exceptional heart care, we have created designated Provider Care Teams.  These Care Teams include your primary Cardiologist (physician) and Advanced Practice Providers (APPs -  Physician Assistants and Nurse Practitioners) who all work together to provide you with the care you need, when you need it.  We recommend signing up for the patient portal called "MyChart".  Sign up information is provided on this After Visit Summary.  MyChart is used to connect with patients for Virtual Visits (Telemedicine).  Patients are able to view lab/test results, encounter notes, upcoming appointments, etc.  Non-urgent messages can be sent to your provider as well.   To learn more about what you can do with MyChart, go to [email or web address].    Your next appointment:   6 week(s)  The format for your next appointment:   In Person  Provider:   Kate Sable, MD  ONLY   Other Instructions

## 2020-11-20 NOTE — Progress Notes (Signed)
Cardiology Office Note:    Date:  11/20/2020   ID:  Tony Williams, DOB Nov 10, 1945, MRN [id number]  PCP:  Virginia Crews, [place]  Cardiologist:  Kate Sable, MD  Advanced Practice Provider:  No care team member to display Electrophysiologist:  None       Referring MD: Virginia Crews, MD   Chief Complaint  Patient presents with   [facility] follow up -- Patient c.o SOB. Meds reviewed verbally with patient.      History of Present Illness:    Tony Williams is a 75 y.o. male with a hx of CAD/CABG x 4 2007, PCI x 2 2018, PCI x 1 SVG-LAD/Diagonal 2022, former smoker, possible COPD, OSA, obesity diabetes who presents for follow-up.  Patient recently admitted to [facility] hospital with chest pain, diagnosed with NSTEMI.left heart cath showing significant SVG to LAD-diagonal stenosis.  Was transferred to [facility], underwent PCI with drug-eluting stent x2 to SVG to LAD diagonal.  Eventually discharged on aspirin and Brilinta.  A day after, presenting with shortness of breath deemed secondary to Brilinta.  He was encouraged to continue Brilinta due to significant disease.      Prior notes Limited echo 11/2020 EF 60 to 65%, indeterminate diastolic function Echo 07/275 normal systolic function, indeterminate diastolic function, EF 60 to 65%. Lexiscan Myoview, fixed perfusion defect, no evidence for ischemia. Patient recently moved to the area from [facility].  States having CAD/CABG x4 back in 2007, in 2018 had a myocardial infarction, had 2 stents placed.  He is intolerant to statins, has tried multiple statins and not able to tolerate due to myalgias.  Currently able to tolerate low-dose lovastatin.    Had sleep study in the past and was diagnosed with OSA, he declines using his CPAP mask.  Does not like the feel for the mask.    Past Medical History:  Diagnosis Date   CAD S/P percutaneous coronary angioplasty     CAD, multiple vessel    Cancer ([place]) 2013   Bladder   Chronic kidney disease    COPD (chronic obstructive pulmonary disease) (HCC)    Depression    Diabetes mellitus without complication (HCC)    Heart problem    Hx of CABG x 4    LIMA-D2, SVG-OM (occluded), SeqSVG-D1-LAD.    Past Surgical History:  Procedure Laterality Date   CHOLECYSTECTOMY, LAPAROSCOPIC  12/26/1995   COLONOSCOPY WITH PROPOFOL N/A 09/23/2020   Procedure: COLONOSCOPY WITH PROPOFOL;  Surgeon: Lucilla Lame, MD;  Location: [facility] ENDOSCOPY;  Service: Endoscopy;  Laterality: N/A;   CORONARY ARTERY BYPASS GRAFT     LIMA-D2, SVG-OM, SeqSVG-D1-LAD   CORONARY STENT INTERVENTION N/A 11/12/2020   Procedure: CORONARY STENT INTERVENTION;  Surgeon: Wellington Hampshire, MD;  Location: [place] CV LAB;  Service: Cardiovascular;  Laterality: N/A;   EYE SURGERY     IABP INSERTION N/A 11/11/2020   Procedure: IABP Insertion;  Surgeon: Nelva Bush, MD;  Location: [place] CV LAB;  Service: Cardiovascular;  Laterality: N/A;   IR STENT PLACEMENT ANTE CAROTID INC ANGIO     KNEE SURGERY     RIGHT/LEFT HEART CATH AND CORONARY/GRAFT ANGIOGRAPHY N/A 11/11/2020   Procedure: RIGHT/LEFT HEART CATH AND CORONARY/GRAFT ANGIOGRAPHY;  Surgeon: Nelva Bush, MD;  Location: [place] CV LAB;  Service: Cardiovascular;  Severe native CAD: 90% dLMCA-100% mLAD/100% ost LCx CTO w/ Severe diffuse RCA disease. Widely patent LIMA-D1. Patent sequential SVG-D2-LAD with 99%  mid graft & 90% LIMA-D2 @ anastomosis. previously stented SVG-OM - CTO. Mildly   TRANSURETHRAL RESECTION OF PROSTATE  1994   URINARY SPHINCTER IMPLANT  1995    Current Medications: Current Meds  Medication Sig   albuterol (VENTOLIN HFA) 108 (90 Base) MCG/ACT inhaler Inhale 2 puffs into the lungs every 6 (six) hours as needed for wheezing or shortness of breath.   allopurinol (ZYLOPRIM) 300 MG tablet Take 1 tablet (300 mg total) by mouth daily.   aspirin EC 81 MG tablet Take  81 mg by mouth daily. Swallow whole.   Bempedoic Acid-Ezetimibe (NEXLIZET) 180-10 MG TABS Take 1 tablet by mouth daily.   blood glucose meter kit and supplies Dispense based on patient and insurance preference. Use up to four times daily as directed. (FOR ICD-10 E10.9, E11.9). check blood glucose fasting and before meals four times daily.   Cholecalciferol (VITAMIN D3 PO) Take 4,000 Units by mouth.   Coenzyme Q10 (CO Q-10) 400 MG CAPS Take by mouth daily at 6 (six) AM.   desoximetasone (TOPICORT) 0.25 % cream Apply 1 application topically 2 (two) times daily.   escitalopram (LEXAPRO) 20 MG tablet Take 1 tablet (20 mg total) by mouth daily.   fluticasone-salmeterol (ADVAIR HFA) 230-21 MCG/ACT inhaler Inhale 1-2 puffs into the lungs 2 (two) times daily. Rinse mouth after each use.   gabapentin (NEURONTIN) 300 MG capsule Take 2 capsules (600 mg total) by mouth at bedtime.   lisinopril (ZESTRIL) 5 MG tablet Take 0.5 tablets (2.5 mg total) by mouth daily.   lovastatin (MEVACOR) 40 MG tablet Take 40 mg by mouth at bedtime.   nitroGLYCERIN (NITROSTAT) 0.4 MG SL tablet Place 1 tablet (0.4 mg total) under the tongue every 5 (five) minutes x 3 doses as needed for chest pain.   Omega-3 Fatty Acids (FISH OIL) 1200 MG CAPS Take by mouth daily at 6 (six) AM.   ticagrelor (BRILINTA) 90 MG TABS tablet Take 1 tablet (90 mg total) by mouth 2 (two) times daily.   torsemide (DEMADEX) 20 MG tablet Take 1 tablet (20 mg total) by mouth 2 (two) times daily.   traZODone (DESYREL) 150 MG tablet Take 1-2 tablets (150-300 mg total) by mouth at bedtime as needed for sleep.   [DISCONTINUED] amLODipine (NORVASC) 10 MG tablet Take 1 tablet (10 mg total) by mouth daily.   [DISCONTINUED] Evolocumab (REPATHA SURECLICK) 268 MG/ML SOAJ Inject 1 pen into the skin every 14 (fourteen) days.     Allergies:   Butorphanol, Morphine, Pedi-pre tape spray [wound dressing adhesive], Simvastatin, and Statins   Social History    Socioeconomic History   Marital status: Married    Spouse name: Not on file   Number of children: 2   Years of education: Not on file   Highest education level: High school graduate  Occupational History   Occupation: retired  Tobacco Use   Smoking status: Former    Types: Cigarettes    Quit date: 10/23/2003    Years since quitting: 17.0   Smokeless tobacco: Never  Vaping Use   Vaping Use: Never used  Substance and Sexual Activity   Alcohol use: Never   Drug use: Never   Sexual activity: Not on file  Other Topics Concern   Not on file  Social History Narrative   Not on file   Social Determinants of Health   Financial Resource Strain: Low Risk    Difficulty of Paying Living Expenses: Not hard at all  Food Insecurity: No Food  Insecurity   Worried About Charity fundraiser in the Last Year: Never true   [place] in the Last Year: Never true  Transportation Needs: No Transportation Needs   Lack of Transportation (Medical): No   Lack of Transportation (Non-Medical): No  Physical Activity: Inactive   Days of Exercise per Week: 0 days   Minutes of Exercise per Session: 0 min  Stress: No Stress Concern Present   Feeling of Stress : Not at all  Social Connections: Moderately Isolated   Frequency of Communication with Friends and Family: More than three times a week   Frequency of Social Gatherings with Friends and Family: Three times a week   Attends Religious Services: Never   Active Member of Clubs or Organizations: No   Attends Archivist Meetings: Never   Marital Status: Married     Family History: The patient's family history includes Heart Problems in his father and mother.  ROS:   Please see the history of present illness.     All other systems reviewed and are negative.  EKGs/Labs/Other Studies Reviewed:    The following studies were reviewed today:   EKG:  EKG is  ordered today.  EKG shows sinus rhythm, heart rate 57.  Recent  Labs: 04/22/2020: TSH 4.100 11/10/2020: Magnesium 1.8 11/15/2020: ALT 18; B Natriuretic Peptide 283.1; BUN 23; Creatinine, Ser 1.98; Hemoglobin 13.6; Platelets 162; Potassium 3.7; Sodium 138  Recent Lipid Panel    Component Value Date/Time   CHOL 123 11/09/2020 0436   CHOL 156 07/21/2020 1440   TRIG 219 (H) 11/09/2020 0436   HDL 32 (L) 11/09/2020 0436   HDL 34 (L) 07/21/2020 1440   CHOLHDL 3.8 11/09/2020 0436   VLDL 44 (H) 11/09/2020 0436   LDLCALC 47 11/09/2020 0436   LDLCALC 84 07/21/2020 1440     Risk Assessment/Calculations:      Physical Exam:    VS:  BP (!) 110/50 (BP Location: Right Arm, Patient Position: Sitting, Cuff Size: Large)   Pulse (!) 57   Ht _0  (1.753 m)   Wt 263 lb (119.3 kg)   SpO2 95%   BMI 38.84 kg/m     Wt Readings from Last 3 Encounters:  11/20/20 263 lb (119.3 kg)  11/15/20 267 lb 13.7 oz (121.5 kg)  11/14/20 270 lb 15.1 oz (122.9 kg)     GEN:  Well nourished, well developed in no acute distress HEENT: Normal NECK: No JVD; No carotid bruits LYMPHATICS: No lymphadenopathy CARDIAC: RRR, no murmurs, rubs, gallops RESPIRATORY: Decreased breath sounds at bases, ABDOMEN: Soft, non-tender, distended MUSCULOSKELETAL:  No edema; No deformity  SKIN: Warm and dry NEUROLOGIC:  Alert and oriented x 3 PSYCHIATRIC:  Normal affect   ASSESSMENT:    1. Shortness of breath   2. Coronary artery disease involving native coronary artery of native heart, unspecified whether angina present   3. Primary hypertension   4. Pure hypercholesterolemia   5. Chronic kidney disease, unspecified CKD stage      PLAN:    In order of problems listed above:  Dyspnea on exertion, etiology multifactorial including COPD, obesity, Brilinta. prior echocardiogram showed normal systolic function, EF 60 to 65%, indeterminate diastolic function.  Start torsemide 20 mg daily.  Check BMP in 1 week.  If symptoms persist, consider switch from Brilinta to Plavix. History of CABG  x4, PCI x2.  Recent PCI x2 to SVG.  Continue aspirin, Repatha Too Much.  Continue Lovastatin, Start Nexlizet. Hypertension, BP  low, continue lisinopril, reduce amlodipine to 5 mg daily. Hyperlipidemia, low-dose lovastatin, start Nexlizet.  Not tolerant to higher doses of statin. History of CKD, referred to nephrology.  Follow-up in 6 weeks.      Medication Adjustments/Labs and Tests Ordered: Current medicines are reviewed at length with the patient today.  Concerns regarding medicines are outlined above.  Orders Placed This Encounter  Procedures   Basic metabolic panel   Ambulatory referral to Nephrology   EKG 12-Lead    Meds ordered this encounter  Medications   amLODipine (NORVASC) 10 MG tablet    Sig: Take 0.5 tablets (5 mg total) by mouth daily.    Dispense:  90 tablet    Refill:  1    No need to refill at this time, patient will break his tablets in half (dosage reduction)   Bempedoic Acid-Ezetimibe (NEXLIZET) 180-10 MG TABS    Sig: Take 1 tablet by mouth daily.    Dispense:  30 tablet    Refill:  11   torsemide (DEMADEX) 20 MG tablet    Sig: Take 1 tablet (20 mg total) by mouth 2 (two) times daily.    Dispense:  30 tablet    Refill:  3     Patient Instructions  Medication Instructions:   Your physician has recommended you make the following change in your medication:    REDUCE your Norvasc (Amlodipine) to 5 MG once a day.  2.    START taking Torsemide 20 MG once a day.  3.     START taking Nexlizet (Bempedoic Acid-Ezetimibe) 180-10 MG once a day.  4.    STOP taking  your Repatha.   *If you need a refill on your cardiac medications before your next appointment, please call your pharmacy*   Lab Work:  Please return to our office for a lab draw (BMP) in one week on __________at___________am/pm   Testing/Procedures: None ordered   Follow-Up: At [facility], you and your health needs are our priority.  As part of our continuing mission to provide  you with exceptional heart care, we have created designated Provider Care Teams.  These Care Teams include your primary Cardiologist (physician) and Advanced Practice Providers (APPs -  Physician Assistants and Nurse Practitioners) who all work together to provide you with the care you need, when you need it.  We recommend signing up for the patient portal called "MyChart".  Sign up information is provided on this After Visit Summary.  MyChart is used to connect with patients for Virtual Visits (Telemedicine).  Patients are able to view lab/test results, encounter notes, upcoming appointments, etc.  Non-urgent messages can be sent to your provider as well.   To learn more about what you can do with MyChart, go to [email or web address].    Your next appointment:   6 week(s)  The format for your next appointment:   In Person  Provider:   Kate Sable, MD  ONLY   Other Instructions    Signed, Kate Sable, MD  11/20/2020 1:03 PM    [place]

## 2020-11-26 ENCOUNTER — Ambulatory Visit: Payer: Medicare Other | Admitting: Pharmacist

## 2020-11-26 NOTE — Progress Notes (Deleted)
Patient ID: Tony Williams                 DOB: Dec 31, 1945                    MRN: [id number]     HPI: [name] is a 75 y.o. male patient of Dr Garen Lah referred to lipid clinic at most recent hospital discharge 2 weeks ago. PMH is significant for CAD s/p CABG x4 in 2007, MI with 2 stents placed in 2018, and most recently NSTEMI in the past month with PCI and DES x2 to SVG-LAD/diag, DM, CKD, obesity, former tobacco use, and possible COPD.   Sob on brilinta stayed on bc of significant disease Started on nexlizet last week by [employer], did pt actually pick up?  It's not on formulary Was repatha cost prohibitive? Prescribed earlier this year and d/c at visit last week - $74/3 month supply or $37/1 month Praluent and nexlizet NF Could go for zetia or retry rosuva Vascepa also tier 3 TG > 200  Check bmet and lipid today today and can cancel one in [place] tomorrow - confirm he's been on torsemide for the past week; ldl falsely low at time of MI most likely  What statins has he actually tried? Just has simva listed on allergies  Current Medications: lovastatin 74m daily Intolerances: simvastatin Risk Factors:  LDL goal: <525mdL  Diet:   Exercise:   Family History: Heart Problems in his father and mother.  Social History: Former tobacco use, quit in 2005, denies drug and alcohol use.  Labs: 07/21/20: TC 156, TG 228, HDL 34, LDL 84 11/09/20: TC 123, TG 219, HDL 32, LDL 47 (LDL falsely low - checked at time of MI)  Past Medical History:  Diagnosis Date   CAD S/P percutaneous coronary angioplasty    CAD, multiple vessel    [id number]   Bladder   Chronic kidney disease    COPD ([place] disease) [place]   Depression    Di[place]   Heart problem    Hx of CABG x 4    LIMA-D2, SVG-OM (occluded), SeqSVG-D1-LAD.    Current Outpatient Medications on File Prior to Visit  Medication Sig Dispense Refill    albuterol (VENTOLIN HFA) 108 (90 Base) MCG/ACT inhaler Inhale 2 puffs into the lungs every 6 (six) hours as needed for wheezing or shortness of breath. 8 g 2   allopurinol (ZYLOPRIM) 300 MG tablet Take 1 tablet (300 mg total) by mouth daily. 90 tablet 3   amLODipine (NORVASC) 10 MG tablet Take 0.5 tablets (5 mg total) by mouth daily. 90 tablet 1   aspirin EC 81 MG tablet Take 81 mg by mouth daily. Swallow whole.     Bempedoic Acid-Ezetimibe (NEXLIZET) 180-10 MG TABS Take 1 tablet by mouth daily. 30 tablet 11   blood glucose meter kit and supplies Dispense based on patient and insurance preference. Use up to four times daily as directed. (FOR ICD-10 E10.9, E11.9). check blood glucose fasting and before meals four times daily. 1 each 0   Cholecalciferol (VITAMIN D3 PO) Take 4,000 Units by mouth.     Coenzyme Q10 (CO Q-10) 400 MG CAPS Take by mouth daily at 6 (six) AM.     desoximetasone (TOPICORT) 0.25 % cream Apply 1 application topically 2 (two) times daily.     escitalopram (LEXAPRO) 20 MG tablet Take 1 tablet (20 mg total) by mouth daily. [place]  tablet 1   fluticasone-salmeterol (ADVAIR HFA) 230-21 MCG/ACT inhaler Inhale 1-2 puffs into the lungs 2 (two) times daily. Rinse mouth after each use. 1 each 12   gabapentin (NEURONTIN) 300 MG capsule Take 2 capsules (600 mg total) by mouth at bedtime. 180 capsule 1   lisinopril (ZESTRIL) 5 MG tablet Take 0.5 tablets (2.5 mg total) by mouth daily.     lovastatin (MEVACOR) 40 MG tablet Take 40 mg by mouth at bedtime.     nitroGLYCERIN (NITROSTAT) 0.4 MG SL tablet Place 1 tablet (0.4 mg total) under the tongue every 5 (five) minutes x 3 doses as needed for chest pain. 25 tablet 1   Omega-3 Fatty Acids (FISH OIL) 1200 MG CAPS Take by mouth daily at 6 (six) AM.     ticagrelor (BRILINTA) 90 MG TABS tablet Take 1 tablet (90 mg total) by mouth 2 (two) times daily. 60 tablet 11   torsemide (DEMADEX) 20 MG tablet Take 1 tablet (20 mg total) by mouth 2 (two) times  daily. 30 tablet 3   traZODone (DESYREL) 150 MG tablet Take 1-2 tablets (150-300 mg total) by mouth at bedtime as needed for sleep. 180 tablet 1   No current facility-administered medications on file prior to visit.    Allergies  Allergen Reactions   Butorphanol Itching   Morphine Other (See Comments)    Redness around IV site   Pedi-Pre Tape Spray [Wound Dressing Adhesive] Itching   Simvastatin Other (See Comments)    "Destroyed my muscles"   Statins Other (See Comments)    "Destroyed my muscles"    Assessment/Plan:  1. Hyperlipidemia -

## 2020-11-27 ENCOUNTER — Other Ambulatory Visit (INDEPENDENT_AMBULATORY_CARE_PROVIDER_SITE_OTHER): Payer: Medicare Other

## 2020-11-27 ENCOUNTER — Other Ambulatory Visit: Payer: Self-pay

## 2020-11-27 DIAGNOSIS — I2510 Atherosclerotic heart disease of native coronary artery without angina pectoris: Secondary | ICD-10-CM | POA: Diagnosis not present

## 2020-11-28 ENCOUNTER — Encounter: Payer: Medicare Other | Attending: Cardiology | Admitting: *Deleted

## 2020-11-28 DIAGNOSIS — Z955 Presence of coronary angioplasty implant and graft: Secondary | ICD-10-CM

## 2020-11-28 DIAGNOSIS — I214 Non-ST elevation (NSTEMI) myocardial infarction: Secondary | ICD-10-CM

## 2020-11-28 LAB — BASIC METABOLIC PANEL
BUN/Creatinine Ratio: 19 (ref 10–24)
BUN: 39 mg/dL — ABNORMAL HIGH (ref 8–27)
CO2: 22 mmol/L (ref 20–29)
Calcium: 9.9 mg/dL (ref 8.6–10.2)
Chloride: 98 mmol/L (ref 96–106)
Creatinine, Ser: 2.04 mg/dL — ABNORMAL HIGH (ref 0.76–1.27)
Glucose: 135 mg/dL — ABNORMAL HIGH (ref 65–99)
Potassium: 5.2 mmol/L (ref 3.5–5.2)
Sodium: 137 mmol/L (ref 134–144)
eGFR: 34 mL/min/{1.73_m2} — ABNORMAL LOW (ref >59)

## 2020-11-28 NOTE — Progress Notes (Signed)
Initial telephone orientation completed. Diagnosis can be found in [facility] 8/30. EP orientation scheduled for Monday 9/26 at 1:30pm.

## 2020-12-02 ENCOUNTER — Other Ambulatory Visit (HOSPITAL_COMMUNITY): Payer: Self-pay

## 2020-12-02 ENCOUNTER — Telehealth (HOSPITAL_COMMUNITY): Payer: Self-pay

## 2020-12-02 NOTE — Telephone Encounter (Signed)
Transitions of Care Pharmacy  ° °Call attempted for a pharmacy transitions of care follow-up. HIPAA appropriate voicemail was left with call back information provided.  ° °Call attempt #1. Will follow-up in 2-3 days.  °  °

## 2020-12-03 ENCOUNTER — Telehealth (HOSPITAL_COMMUNITY): Payer: Self-pay

## 2020-12-03 ENCOUNTER — Other Ambulatory Visit (HOSPITAL_COMMUNITY): Payer: Self-pay

## 2020-12-03 NOTE — Telephone Encounter (Signed)
Pharmacy Transitions of Care Follow-up Telephone Call  Date of discharge: 11/16/20  Discharge Diagnosis: NSTEMI  How have you been since you were released from the hospital?  Patient doing well, no questions about meds at this time.  Medication changes made at discharge:  - START: Brilinta  Medication changes verified by the patient? Yes    Medication Accessibility:  Home Pharmacy:  [facility]   Was the patient provided with refills on discharged medications? Yes   Have all prescriptions been transferred from [facility] to home pharmacy?  yes  Is the patient able to afford medications? Has insurance    Medication Review:  TICAGRELOR (BRILINTA) Ticagrelor 90 mg BID initiated on 11/15/20.  - Educated patient on expected duration of therapy of aspirin with ticagrelor.  - Discussed importance of taking medication around the same time every day - Advised patient of medications to avoid (NSAIDs, aspirin maintenance doses>100 mg daily) - Educated that Tylenol (acetaminophen) will be the preferred analgesic to prevent risk of bleeding  - Emphasized importance of monitoring for signs and symptoms of bleeding (abnormal bruising, prolonged bleeding, nose bleeds, bleeding from gums, discolored urine, black tarry stools)  - Educated patient to notify doctor if shortness of breath or abnormal heartbeat occur - Advised patient to alert all providers of antiplatelet therapy prior to starting a new medication or having a procedure   Follow-up Appointments:  PCP Hospital f/u appt confirmed?  Scheduled to see Dr. Brita Romp on 01/29/21 @ [place].   [facility] f/u appt confirmed?  Scheduled to see Dr. Garen Lah on 11/20/20 @ Cardiology.   If their condition worsens, is the pt aware to call PCP or go to the Emergency Dept.? yes  Final Patient Assessment: Patient has refills at home pharmacy and follow up scheduled

## 2020-12-08 ENCOUNTER — Encounter: Payer: Medicare Other | Admitting: *Deleted

## 2020-12-08 ENCOUNTER — Inpatient Hospital Stay
Admission: EM | Admit: 2020-12-08 | Discharge: 2020-12-11 | DRG: 917 | Disposition: A | Discharge status: Home / Self Care | Payer: Medicare Other | Attending: Internal Medicine | Admitting: Internal Medicine

## 2020-12-08 ENCOUNTER — Telehealth: Payer: Self-pay | Admitting: Cardiology

## 2020-12-08 ENCOUNTER — Emergency Department: Payer: Medicare Other

## 2020-12-08 ENCOUNTER — Other Ambulatory Visit: Payer: Self-pay

## 2020-12-08 ENCOUNTER — Encounter: Payer: Self-pay | Admitting: Internal Medicine

## 2020-12-08 DIAGNOSIS — J9601 Acute respiratory failure with hypoxia: Secondary | ICD-10-CM | POA: Diagnosis present

## 2020-12-08 DIAGNOSIS — Z6839 Body mass index (BMI) 39.0-39.9, adult: Secondary | ICD-10-CM | POA: Diagnosis not present

## 2020-12-08 DIAGNOSIS — I10 Essential (primary) hypertension: Secondary | ICD-10-CM

## 2020-12-08 DIAGNOSIS — I5030 Unspecified diastolic (congestive) heart failure: Secondary | ICD-10-CM | POA: Diagnosis not present

## 2020-12-08 DIAGNOSIS — Z87891 Personal history of nicotine dependence: Secondary | ICD-10-CM

## 2020-12-08 DIAGNOSIS — Z951 Presence of aortocoronary bypass graft: Secondary | ICD-10-CM | POA: Diagnosis not present

## 2020-12-08 DIAGNOSIS — I959 Hypotension, unspecified: Secondary | ICD-10-CM

## 2020-12-08 DIAGNOSIS — N183 Chronic kidney disease, stage 3 unspecified: Secondary | ICD-10-CM

## 2020-12-08 DIAGNOSIS — Z955 Presence of coronary angioplasty implant and graft: Secondary | ICD-10-CM

## 2020-12-08 DIAGNOSIS — T447X1A Poisoning by beta-adrenoreceptor antagonists, accidental (unintentional), initial encounter: Secondary | ICD-10-CM | POA: Diagnosis not present

## 2020-12-08 DIAGNOSIS — Z888 Allergy status to other drugs, medicaments and biological substances status: Secondary | ICD-10-CM

## 2020-12-08 DIAGNOSIS — I2583 Coronary atherosclerosis due to lipid rich plaque: Secondary | ICD-10-CM | POA: Diagnosis not present

## 2020-12-08 DIAGNOSIS — Z7982 Long term (current) use of aspirin: Secondary | ICD-10-CM

## 2020-12-08 DIAGNOSIS — E1122 Type 2 diabetes mellitus with diabetic chronic kidney disease: Secondary | ICD-10-CM | POA: Diagnosis present

## 2020-12-08 DIAGNOSIS — Z9049 Acquired absence of other specified parts of digestive tract: Secondary | ICD-10-CM | POA: Diagnosis not present

## 2020-12-08 DIAGNOSIS — I252 Old myocardial infarction: Secondary | ICD-10-CM | POA: Diagnosis not present

## 2020-12-08 DIAGNOSIS — I495 Sick sinus syndrome: Secondary | ICD-10-CM | POA: Diagnosis not present

## 2020-12-08 DIAGNOSIS — R0602 Shortness of breath: Secondary | ICD-10-CM | POA: Diagnosis not present

## 2020-12-08 DIAGNOSIS — R42 Dizziness and giddiness: Secondary | ICD-10-CM | POA: Diagnosis not present

## 2020-12-08 DIAGNOSIS — E875 Hyperkalemia: Secondary | ICD-10-CM | POA: Diagnosis not present

## 2020-12-08 DIAGNOSIS — F5104 Psychophysiologic insomnia: Secondary | ICD-10-CM | POA: Diagnosis not present

## 2020-12-08 DIAGNOSIS — G4733 Obstructive sleep apnea (adult) (pediatric): Secondary | ICD-10-CM | POA: Diagnosis present

## 2020-12-08 DIAGNOSIS — J9811 Atelectasis: Secondary | ICD-10-CM | POA: Diagnosis present

## 2020-12-08 DIAGNOSIS — I272 Pulmonary hypertension, unspecified: Secondary | ICD-10-CM | POA: Diagnosis not present

## 2020-12-08 DIAGNOSIS — I25118 Atherosclerotic heart disease of native coronary artery with other forms of angina pectoris: Secondary | ICD-10-CM | POA: Diagnosis present

## 2020-12-08 DIAGNOSIS — R0902 Hypoxemia: Secondary | ICD-10-CM | POA: Diagnosis not present

## 2020-12-08 DIAGNOSIS — N179 Acute kidney failure, unspecified: Secondary | ICD-10-CM | POA: Diagnosis not present

## 2020-12-08 DIAGNOSIS — Z20822 Contact with and (suspected) exposure to covid-19: Secondary | ICD-10-CM | POA: Diagnosis present

## 2020-12-08 DIAGNOSIS — I130 Hypertensive heart and chronic kidney disease with heart failure and stage 1 through stage 4 chronic kidney disease, or unspecified chronic kidney disease: Secondary | ICD-10-CM | POA: Diagnosis not present

## 2020-12-08 DIAGNOSIS — Z885 Allergy status to narcotic agent status: Secondary | ICD-10-CM

## 2020-12-08 DIAGNOSIS — I5032 Chronic diastolic (congestive) heart failure: Secondary | ICD-10-CM | POA: Diagnosis present

## 2020-12-08 DIAGNOSIS — Z7951 Long term (current) use of inhaled steroids: Secondary | ICD-10-CM

## 2020-12-08 DIAGNOSIS — E785 Hyperlipidemia, unspecified: Secondary | ICD-10-CM | POA: Diagnosis present

## 2020-12-08 DIAGNOSIS — R55 Syncope and collapse: Secondary | ICD-10-CM | POA: Diagnosis present

## 2020-12-08 DIAGNOSIS — Z79899 Other long term (current) drug therapy: Secondary | ICD-10-CM

## 2020-12-08 DIAGNOSIS — J449 Chronic obstructive pulmonary disease, unspecified: Secondary | ICD-10-CM | POA: Diagnosis present

## 2020-12-08 DIAGNOSIS — R001 Bradycardia, unspecified: Secondary | ICD-10-CM | POA: Diagnosis not present

## 2020-12-08 DIAGNOSIS — Z7902 Long term (current) use of antithrombotics/antiplatelets: Secondary | ICD-10-CM

## 2020-12-08 DIAGNOSIS — I214 Non-ST elevation (NSTEMI) myocardial infarction: Secondary | ICD-10-CM

## 2020-12-08 DIAGNOSIS — E782 Mixed hyperlipidemia: Secondary | ICD-10-CM | POA: Diagnosis not present

## 2020-12-08 DIAGNOSIS — N1831 Chronic kidney disease, stage 3a: Secondary | ICD-10-CM | POA: Diagnosis not present

## 2020-12-08 DIAGNOSIS — Z23 Encounter for immunization: Secondary | ICD-10-CM

## 2020-12-08 DIAGNOSIS — J9690 Respiratory failure, unspecified, unspecified whether with hypoxia or hypercapnia: Secondary | ICD-10-CM | POA: Diagnosis not present

## 2020-12-08 DIAGNOSIS — I2510 Atherosclerotic heart disease of native coronary artery without angina pectoris: Secondary | ICD-10-CM | POA: Diagnosis not present

## 2020-12-08 HISTORY — DX: Acute myocardial infarction, unspecified: I21.9

## 2020-12-08 LAB — COMPREHENSIVE METABOLIC PANEL
ALT: 13 U/L (ref 0–44)
AST: 14 U/L — ABNORMAL LOW (ref 15–41)
Albumin: 3.8 g/dL (ref 3.5–5.0)
Alkaline Phosphatase: 45 U/L (ref 38–126)
Anion gap: 8 (ref 5–15)
BUN: 51 mg/dL — ABNORMAL HIGH (ref 8–23)
CO2: 27 mmol/L (ref 22–32)
Calcium: 9.4 mg/dL (ref 8.9–10.3)
Chloride: 101 mmol/L (ref 98–111)
Creatinine, Ser: 2.52 mg/dL — ABNORMAL HIGH (ref 0.61–1.24)
GFR, Estimated: 26 mL/min — ABNORMAL LOW (ref >60)
Glucose, Bld: 146 mg/dL — ABNORMAL HIGH (ref 70–99)
Potassium: 5.6 mmol/L — ABNORMAL HIGH (ref 3.5–5.1)
Sodium: 136 mmol/L (ref 135–145)
Total Bilirubin: 0.7 mg/dL (ref 0.3–1.2)
Total Protein: 7.3 g/dL (ref 6.5–8.1)

## 2020-12-08 LAB — BASIC METABOLIC PANEL
Anion gap: 9 (ref 5–15)
BUN: 48 mg/dL — ABNORMAL HIGH (ref 8–23)
CO2: 26 mmol/L (ref 22–32)
Calcium: 8.7 mg/dL — ABNORMAL LOW (ref 8.9–10.3)
Chloride: 100 mmol/L (ref 98–111)
Creatinine, Ser: 2.4 mg/dL — ABNORMAL HIGH (ref 0.61–1.24)
GFR, Estimated: 28 mL/min — ABNORMAL LOW (ref >60)
Glucose, Bld: 208 mg/dL — ABNORMAL HIGH (ref 70–99)
Potassium: 5.2 mmol/L — ABNORMAL HIGH (ref 3.5–5.1)
Sodium: 135 mmol/L (ref 135–145)

## 2020-12-08 LAB — CBC WITH DIFFERENTIAL/PLATELET
Abs Immature Granulocytes: 0.04 10*3/uL (ref 0.00–0.07)
Basophils Absolute: 0.1 10*3/uL (ref 0.0–0.1)
Basophils Relative: 1 %
Eosinophils Absolute: 1 10*3/uL — ABNORMAL HIGH (ref 0.0–0.5)
Eosinophils Relative: 7 %
HCT: 44.4 % (ref 39.0–52.0)
Hemoglobin: 14.9 g/dL (ref 13.0–17.0)
Immature Granulocytes: 0 %
Lymphocytes Relative: 15 %
Lymphs Abs: 2 10*3/uL (ref 0.7–4.0)
MCH: 30.8 pg (ref 26.0–34.0)
MCHC: 33.6 g/dL (ref 30.0–36.0)
MCV: 91.7 fL (ref 80.0–100.0)
Monocytes Absolute: 1 10*3/uL (ref 0.1–1.0)
Monocytes Relative: 8 %
Neutro Abs: 9 10*3/uL — ABNORMAL HIGH (ref 1.7–7.7)
Neutrophils Relative %: 69 %
Platelets: 231 10*3/uL (ref 150–400)
RBC: 4.84 MIL/uL (ref 4.22–5.81)
RDW: 14.4 % (ref 11.5–15.5)
WBC: 13.1 10*3/uL — ABNORMAL HIGH (ref 4.0–10.5)
nRBC: 0 % (ref 0.0–0.2)

## 2020-12-08 LAB — T4, FREE: Free T4: 0.82 ng/dL (ref 0.61–1.12)

## 2020-12-08 LAB — RESP PANEL BY RT-PCR (FLU A&B, COVID) ARPGX2
Influenza A by PCR: NEGATIVE
Influenza B by PCR: NEGATIVE
SARS Coronavirus 2 by RT PCR: NEGATIVE

## 2020-12-08 LAB — BRAIN NATRIURETIC PEPTIDE: B Natriuretic Peptide: 142.7 pg/mL — ABNORMAL HIGH (ref 0.0–100.0)

## 2020-12-08 LAB — TSH: TSH: 2.515 u[IU]/mL (ref 0.350–4.500)

## 2020-12-08 LAB — TROPONIN I (HIGH SENSITIVITY)
Troponin I (High Sensitivity): 11 ng/L (ref <18)
Troponin I (High Sensitivity): 10 ng/L (ref <18)

## 2020-12-08 LAB — MAGNESIUM: Magnesium: 2.4 mg/dL (ref 1.7–2.4)

## 2020-12-08 MED ORDER — HYDROCODONE-ACETAMINOPHEN 5-325 MG PO TABS
1.0000 | ORAL_TABLET | Freq: Three times a day (TID) | ORAL | Status: DC | PRN
Start: 2020-12-08 — End: 2020-12-11
  Administered 2020-12-08 – 2020-12-10 (×3): 1 via ORAL
  Filled 2020-12-08 (×3): qty 1

## 2020-12-08 MED ORDER — ONDANSETRON HCL 4 MG/2ML IJ SOLN
4.0000 mg | Freq: Four times a day (QID) | INTRAMUSCULAR | Status: DC | PRN
  Administered 2020-12-09 (×2): 4 mg via INTRAVENOUS
  Filled 2020-12-08 (×2): qty 2

## 2020-12-08 MED ORDER — INSULIN ASPART 100 UNIT/ML IJ SOLN
0.0000 [IU] | Freq: Three times a day (TID) | INTRAMUSCULAR | Status: DC
  Administered 2020-12-09 – 2020-12-10 (×6): 2 [IU] via SUBCUTANEOUS
  Filled 2020-12-08 (×5): qty 1

## 2020-12-08 MED ORDER — ACETAMINOPHEN 325 MG PO TABS: 650.0000 mg | ORAL_TABLET | ORAL | Status: DC | PRN

## 2020-12-08 MED ORDER — SODIUM ZIRCONIUM CYCLOSILICATE 10 G PO PACK
10.0000 g | PACK | Freq: Once | ORAL | Status: DC
  Filled 2020-12-08: qty 1

## 2020-12-08 MED ORDER — SODIUM CHLORIDE 0.9% FLUSH
3.0000 mL | Freq: Two times a day (BID) | INTRAVENOUS | Status: DC
  Administered 2020-12-09 – 2020-12-11 (×5): 3 mL via INTRAVENOUS

## 2020-12-08 MED ORDER — ACETAMINOPHEN 325 MG PO TABS
650.0000 mg | ORAL_TABLET | Freq: Four times a day (QID) | ORAL | Status: DC | PRN
  Administered 2020-12-09 – 2020-12-11 (×5): 650 mg via ORAL
  Filled 2020-12-08 (×5): qty 2

## 2020-12-08 MED ORDER — SODIUM CHLORIDE 0.9% FLUSH
3.0000 mL | INTRAVENOUS | Status: DC | PRN
  Administered 2020-12-09: 3 mL via INTRAVENOUS

## 2020-12-08 MED ORDER — ATROPINE SULFATE 1 MG/10ML IJ SOSY
1.0000 mg | PREFILLED_SYRINGE | Freq: Once | INTRAMUSCULAR | Status: AC
  Administered 2020-12-08: 1 mg via INTRAVENOUS
  Filled 2020-12-08: qty 10

## 2020-12-08 MED ORDER — SODIUM CHLORIDE 0.9 % IV SOLN
250.0000 mL | INTRAVENOUS | Status: DC | PRN
  Administered 2020-12-09: 250 mL via INTRAVENOUS

## 2020-12-08 MED ORDER — ASPIRIN EC 81 MG PO TBEC
81.0000 mg | DELAYED_RELEASE_TABLET | Freq: Every day | ORAL | Status: DC
  Administered 2020-12-09 – 2020-12-11 (×3): 81 mg via ORAL
  Filled 2020-12-08 (×3): qty 1

## 2020-12-08 MED ORDER — DOPAMINE-DEXTROSE 3.2-5 MG/ML-% IV SOLN
5.0000 ug/kg/min | INTRAVENOUS | Status: DC
  Filled 2020-12-08: qty 250

## 2020-12-08 MED ORDER — GLUCAGON HCL (RDNA) 1 MG IJ SOLR
5.0000 mg | Freq: Once | INTRAMUSCULAR | Status: DC
  Filled 2020-12-08: qty 5

## 2020-12-08 MED ORDER — INFLUENZA VAC A&B SA ADJ QUAD 0.5 ML IM PRSY
0.5000 mL | PREFILLED_SYRINGE | INTRAMUSCULAR | Status: AC
  Administered 2020-12-10: 0.5 mL via INTRAMUSCULAR
  Filled 2020-12-08: qty 0.5

## 2020-12-08 MED ORDER — POLYETHYLENE GLYCOL 3350 17 G PO PACK: 17.0000 g | PACK | Freq: Every day | ORAL | Status: DC | PRN

## 2020-12-08 MED ORDER — CALCIUM GLUCONATE-NACL 1-0.675 GM/50ML-% IV SOLN
1.0000 g | Freq: Once | INTRAVENOUS | Status: AC
  Administered 2020-12-08: 1000 mg via INTRAVENOUS
  Filled 2020-12-08: qty 50

## 2020-12-08 MED ORDER — ACETAMINOPHEN 325 MG PO TABS
650.0000 mg | ORAL_TABLET | ORAL | Status: DC | PRN
Start: 2020-12-08 — End: 2020-12-08

## 2020-12-08 MED ORDER — SODIUM CHLORIDE 0.9 % IV BOLUS
500.0000 mL | Freq: Once | INTRAVENOUS | Status: AC
  Administered 2020-12-08: 500 mL via INTRAVENOUS

## 2020-12-08 MED ORDER — INSULIN ASPART 100 UNIT/ML IJ SOLN
0.0000 [IU] | Freq: Every day | INTRAMUSCULAR | Status: DC
  Filled 2020-12-08: qty 1

## 2020-12-08 MED ORDER — CHLORHEXIDINE GLUCONATE CLOTH 2 % EX PADS
6.0000 | MEDICATED_PAD | Freq: Every day | CUTANEOUS | Status: DC
  Administered 2020-12-08 – 2020-12-10 (×3): 6 via TOPICAL

## 2020-12-08 MED ORDER — DOCUSATE SODIUM 100 MG PO CAPS: 100.0000 mg | ORAL_CAPSULE | Freq: Two times a day (BID) | ORAL | Status: DC | PRN

## 2020-12-08 MED ORDER — MELATONIN 5 MG PO TABS
5.0000 mg | ORAL_TABLET | Freq: Every evening | ORAL | Status: DC | PRN
  Administered 2020-12-08 – 2020-12-10 (×3): 5 mg via ORAL
  Filled 2020-12-08 (×3): qty 1

## 2020-12-08 MED ORDER — ONDANSETRON HCL 4 MG/2ML IJ SOLN
4.0000 mg | Freq: Once | INTRAMUSCULAR | Status: AC
  Administered 2020-12-08: 4 mg via INTRAVENOUS
  Filled 2020-12-08: qty 2

## 2020-12-08 MED ORDER — FAMOTIDINE 20 MG IN NS 100 ML IVPB
20.0000 mg | Freq: Two times a day (BID) | INTRAVENOUS | Status: DC
  Administered 2020-12-08 – 2020-12-10 (×4): 20 mg via INTRAVENOUS
  Filled 2020-12-08 (×5): qty 100

## 2020-12-08 MED ORDER — TICAGRELOR 90 MG PO TABS
90.0000 mg | ORAL_TABLET | Freq: Two times a day (BID) | ORAL | Status: DC
  Administered 2020-12-08 – 2020-12-11 (×6): 90 mg via ORAL
  Filled 2020-12-08 (×7): qty 1

## 2020-12-08 MED ORDER — HEPARIN SODIUM (PORCINE) 5000 UNIT/ML IJ SOLN
5000.0000 [IU] | Freq: Three times a day (TID) | INTRAMUSCULAR | Status: DC
  Administered 2020-12-08 – 2020-12-11 (×9): 5000 [IU] via SUBCUTANEOUS
  Filled 2020-12-08 (×9): qty 1

## 2020-12-08 MED ORDER — GLUCAGON HCL RDNA (DIAGNOSTIC) 1 MG IJ SOLR
5.0000 mg | Freq: Once | INTRAVENOUS | Status: DC
  Filled 2020-12-08: qty 5

## 2020-12-08 MED ORDER — ATROPINE SULFATE 1 MG/10ML IJ SOSY
PREFILLED_SYRINGE | INTRAMUSCULAR | Status: AC
  Administered 2020-12-08: 1 mg
  Filled 2020-12-08: qty 10

## 2020-12-08 MED ORDER — GLUCAGON HCL RDNA (DIAGNOSTIC) 1 MG IJ SOLR
5.0000 mg | Freq: Once | INTRAVENOUS | Status: AC
  Administered 2020-12-08: 5 mg via INTRAVENOUS
  Filled 2020-12-08: qty 5

## 2020-12-08 MED ORDER — MOMETASONE FURO-FORMOTEROL FUM 200-5 MCG/ACT IN AERO
2.0000 | INHALATION_SPRAY | Freq: Two times a day (BID) | RESPIRATORY_TRACT | Status: DC
Start: 2020-12-08 — End: 2020-12-11
  Administered 2020-12-08 – 2020-12-11 (×6): 2 via RESPIRATORY_TRACT
  Filled 2020-12-08: qty 8.8

## 2020-12-08 NOTE — Telephone Encounter (Signed)
Spoke with patients wife to review recommendations to proceed to ED for further evaluation. Given low heart rate, low blood pressure, and symptoms. Medications were increased by patient and therefore this could be the problem as well. Left appointment for tomorrow for now.

## 2020-12-08 NOTE — Telephone Encounter (Signed)
Left voicemail message to call back regarding concerns

## 2020-12-08 NOTE — Progress Notes (Signed)
eLink Physician-Brief Progress Note Patient Name: Tony Williams DOB: 1946/03/05 MRN: [id number]   Date of Service  12/08/2020  HPI/Events of Note  48 M obese, history of CAD s/p CABG 2007, recent stent SVG to LAD/diagonal 8/3 had, DM, hypertension, dyslipidemia, CKD. Beta blocker being adjusted since recent hospitalization due to symptomatic bradycardia. Brought in today due to pre syncopal episode while attending cardiac rehab and was noted to be hypertensive with HR in the 30s with good response to atropine.  BP 149/91  HR 48 seen conversant and not in distress  eICU Interventions  Symptomatic bradycardia, metoprolol on hold, dopamine drip as per cardiology recommendation. BP stable at this time CAD s/p CABG/stent on DUAPT Acute on CKD likely from low cardiac output state     Intervention Category Evaluation Type: New Patient Evaluation  Judd Lien 12/08/2020, 7:29 PM

## 2020-12-08 NOTE — ED Provider Notes (Signed)
[facility] Emergency Department Provider Note  ____________________________________________   Event Date/Time   First MD Initiated Contact with Patient 12/08/20 1511     (approximate)  I have reviewed the triage vital signs and the nursing notes.   HISTORY  Chief Complaint Bradycardia (Per Pt, I was on my way to cardiac rehab and I felt dizzy and like I was going to pass out. )    HPI Tony Williams is a 75 y.o. male with history of COPD, CABG, recent admission for stenting x2 who was discharged on 9/2.  Patient was told to stop taking his metoprolol due to bradycardia but he stated that the doctor told him just to take half of it and not the full dose.  Patient states that over the past week he has been taking a full dose of it because his blood pressures were high in the 200s.  He also restarted his amlodipine even though he was told to take half a dose of that as well he was taking a whole dose.  Has been doing this for the past week.  Patient reports that since being discharged every morning he feels dizzy, short of breath and then throughout the day he starts feeling better.  Today he had the same symptoms that he took his heart rate and it was low into the 30s and he was hypotensive.  His symptoms are intermittent, worse in the morning, better throughout the day.  Denies any fevers or leg swelling or chest pain or abdominal pain.       Past Medical History:  Diagnosis Date   CAD S/P percutaneous coronary angioplasty    CAD, multiple vessel    Cancer ([place]) 2013   Bladder   Chronic kidney disease    COPD (chronic obstructive pulmonary disease) (HCC)    Depression    Diabetes mellitus without complication (HCC)    Heart problem    Hx of CABG x 4    LIMA-D2, SVG-OM (occluded), SeqSVG-D1-LAD.    Patient Active Problem List   Diagnosis Date Noted   Dyspnea 11/15/2020   Acute on chronic diastolic CHF (congestive heart failure) ([place]) 11/10/2020    NSTEMI (non-ST elevated myocardial infarction) ([place]) 11/08/2020   Positive colorectal cancer screening using Cologuard test    Polyp of transverse colon    Morbid obesity ([place]) 07/21/2020   Chronic pain of both knees 07/21/2020   Diabetic polyneuropathy associated with type 2 diabetes mellitus ([place]) 07/21/2020   Stage 3a chronic kidney disease ([place]) 07/21/2020   Chronic gout due to renal impairment without tophus 07/21/2020   Hypertension associated with diabetes ([place]) 07/21/2020   Hyperlipidemia associated with type 2 diabetes mellitus ([place]) 07/21/2020   GAD (generalized anxiety disorder) 07/21/2020   Psychophysiological insomnia 07/21/2020   Screening for blood or protein in urine 04/18/2020   Vitamin D insufficiency 04/18/2020   Chronic obstructive pulmonary disease ([place]) 04/18/2020   Controlled type 2 diabetes mellitus with diabetic polyneuropathy, without long-term current use of insulin ([place]) 04/18/2020   Coronary artery disease with hx of myocardial infarct w/o hx of CABG 04/18/2020   S/P primary angioplasty with coronary stent 04/18/2020   History of smoking at least 1 pack per day for at least 30 years 04/18/2020    Past Surgical History:  Procedure Laterality Date   CHOLECYSTECTOMY, LAPAROSCOPIC  12/26/1995   COLONOSCOPY WITH PROPOFOL N/A 09/23/2020   Procedure: COLONOSCOPY WITH PROPOFOL;  Surgeon: Lucilla Lame, MD;  Location: [facility] ENDOSCOPY;  Service: Endoscopy;  Laterality: N/A;   CORONARY ARTERY BYPASS GRAFT     LIMA-D2, SVG-OM, SeqSVG-D1-LAD   CORONARY STENT INTERVENTION N/A 11/12/2020   Procedure: CORONARY STENT INTERVENTION;  Surgeon: Wellington Hampshire, MD;  Location: [place] [facility];  Service: Cardiovascular;  Laterality: N/A;   EYE SURGERY     IABP INSERTION N/A 11/11/2020   Procedure: IABP Insertion;  Surgeon: Nelva Bush, MD;  Location: [place] [facility];  Service: Cardiovascular;  Laterality: N/A;   IR STENT PLACEMENT ANTE CAROTID INC ANGIO     KNEE  SURGERY     RIGHT/LEFT HEART CATH AND CORONARY/GRAFT ANGIOGRAPHY N/A 11/11/2020   Procedure: RIGHT/LEFT HEART CATH AND CORONARY/GRAFT ANGIOGRAPHY;  Surgeon: Nelva Bush, MD;  Location: [place] [facility];  Service: Cardiovascular;  Severe native CAD: 90% dLMCA-100% mLAD/100% ost LCx CTO w/ Severe diffuse RCA disease. Widely patent LIMA-D1. Patent sequential SVG-D2-LAD with 99% mid graft & 90% LIMA-D2 @ anastomosis. previously stented SVG-OM - CTO. Mildly   TRANSURETHRAL RESECTION OF PROSTATE  1994   URINARY SPHINCTER IMPLANT  1995    Prior to Admission medications   Medication Sig Start Date End Date Taking? Authorizing Provider  albuterol (VENTOLIN HFA) 108 (90 Base) MCG/ACT inhaler Inhale 2 puffs into the lungs every 6 (six) hours as needed for wheezing or shortness of breath. 04/18/20   Flinchum, Kelby Aline, FNP  allopurinol (ZYLOPRIM) 300 MG tablet Take 1 tablet (300 mg total) by mouth daily. 07/21/20   Virginia Crews, MD  amLODipine (NORVASC) 10 MG tablet Take 0.5 tablets (5 mg total) by mouth daily. 11/20/20   Kate Sable, MD  aspirin EC 81 MG tablet Take 81 mg by mouth daily. Swallow whole.    [provider]  Bempedoic Acid-Ezetimibe (NEXLIZET) 180-10 MG TABS Take 1 tablet by mouth daily. 11/20/20   Kate Sable, MD  blood glucose meter kit and supplies Dispense based on patient and insurance preference. Use up to four times daily as directed. (FOR ICD-10 E10.9, E11.9). check blood glucose fasting and before meals four times daily. 04/18/20   Flinchum, Kelby Aline, FNP  Cholecalciferol (VITAMIN D3 PO) Take 4,000 Units by mouth.    [provider]  Coenzyme Q10 (CO Q-10) 400 MG CAPS Take by mouth daily at 6 (six) AM.    [provider]  desoximetasone (TOPICORT) 0.25 % cream Apply 1 application topically 2 (two) times daily. 10/24/20   [provider]  escitalopram (LEXAPRO) 20 MG tablet Take 1 tablet (20 mg total) by mouth daily. 09/29/20    Virginia Crews, MD  fluticasone-salmeterol (ADVAIR HFA) [phone] MCG/ACT inhaler Inhale 1-2 puffs into the lungs 2 (two) times daily. Rinse mouth after each use. 04/18/20   Flinchum, Kelby Aline, FNP  gabapentin (NEURONTIN) 300 MG capsule Take 2 capsules (600 mg total) by mouth at bedtime. 07/21/20   Virginia Crews, MD  lisinopril (ZESTRIL) 5 MG tablet Take 0.5 tablets (2.5 mg total) by mouth daily. 11/14/20   O'NealCassie Freer, MD  lovastatin (MEVACOR) 40 MG tablet Take 40 mg by mouth at bedtime.    [provider]  nitroGLYCERIN (NITROSTAT) 0.4 MG SL tablet Place 1 tablet (0.4 mg total) under the tongue every 5 (five) minutes x 3 doses as needed for chest pain. 11/14/20   O'NealCassie Freer, MD  Omega-3 Fatty Acids (FISH OIL) 1200 MG CAPS Take by mouth daily at 6 (six) AM.    [provider]  ticagrelor (BRILINTA) 90 MG TABS tablet Take 1  tablet (90 mg total) by mouth 2 (two) times daily. 11/15/20   Geralynn Rile, MD  torsemide (DEMADEX) 20 MG tablet Take 1 tablet (20 mg total) by mouth 2 (two) times daily. 11/20/20 02/18/21  Kate Sable, MD  traZODone (DESYREL) 150 MG tablet Take 1-2 tablets (150-300 mg total) by mouth at bedtime as needed for sleep. 07/21/20   Virginia Crews, MD    Allergies Butorphanol, Morphine, Pedi-pre tape spray [wound dressing adhesive], Simvastatin, and Statins  Family History  Problem Relation Age of Onset   Heart Problems Mother    Heart Problems Father     Social History Social History   Tobacco Use   Smoking status: Former    Types: Cigarettes    Quit date: 10/23/2003    Years since quitting: 17.1   Smokeless tobacco: Never  Vaping Use   Vaping Use: Never used  Substance Use Topics   Alcohol use: Never   Drug use: Never      Review of Systems Constitutional: No fever/chills Eyes: No visual changes. ENT: No sore throat. Cardiovascular: Denies chest pain. Respiratory: Shortness of  breath Gastrointestinal: No abdominal pain.  No nausea, no vomiting.  No diarrhea.  No constipation. Genitourinary: Negative for dysuria. Musculoskeletal: Negative for back pain. Skin: Negative for rash. Neurological: Negative for headaches, focal weakness or numbness. Dizzy  All other ROS negative ____________________________________________   PHYSICAL EXAM:  VITAL SIGNS: ED Triage Vitals [12/08/20 1522]  Enc Vitals Group     BP      Pulse      Resp      Temp      Temp src      SpO2      Weight 259 lb (117.5 kg)     Height $Remov'5\' 8"'KcnfzG$  (1.727 m)     Head Circumference      Peak Flow      Pain Score 0     Pain Loc      Pain Edu?      Excl. in [place]?     Constitutional: Alert and oriented. Well appearing and in no acute distress. Eyes: Conjunctivae are normal. EOMI. Head: Atraumatic. Nose: No congestion/rhinnorhea. Mouth/Throat: Mucous membranes are moist.   Neck: No stridor. Trachea Midline. FROM Cardiovascular: bradycardiac. Grossly normal heart sounds.  Good peripheral circulation. Respiratory: Normal respiratory effort.  No retractions. Lungs CTAB. Gastrointestinal: Soft and nontender. No distention. No abdominal bruits.  Well healing scars  Musculoskeletal: No lower extremity tenderness nor edema.  No joint effusions. Neurologic:  Normal speech and language. No gross focal neurologic deficits are appreciated.  Skin:  Skin is warm, dry and intact. No rash noted. Psychiatric: Mood and affect are normal. Speech and behavior are normal. GU: Deferred   ____________________________________________   LABS (all labs ordered are listed, but only abnormal results are displayed)  Labs Reviewed  CBC WITH DIFFERENTIAL/PLATELET - Abnormal; Notable for the following components:      Result Value   WBC 13.1 (*)    Neutro Abs 9.0 (*)    Eosinophils Absolute 1.0 (*)    All other components within normal limits  COMPREHENSIVE METABOLIC PANEL - Abnormal; Notable for the following  components:   Potassium 5.6 (*)    Glucose, Bld 146 (*)    BUN 51 (*)    Creatinine, Ser 2.52 (*)    AST 14 (*)    GFR, Estimated 26 (*)    All other components within normal limits  BRAIN NATRIURETIC PEPTIDE -  Abnormal; Notable for the following components:   B Natriuretic Peptide 142.7 (*)    All other components within normal limits  RESP PANEL BY RT-PCR (FLU A&B, COVID) ARPGX2  MAGNESIUM  TSH  T4, FREE  CBC  CREATININE, SERUM  TROPONIN I (HIGH SENSITIVITY)  TROPONIN I (HIGH SENSITIVITY)   ____________________________________________   ED ECG REPORT I, Vanessa [place], the attending physician, personally viewed and interpreted this ECG.  Sinus bradycardia rate of 42, no ST elevation, no T wave inversions, normal intervals ____________________________________________  RADIOLOGY Robert Bellow, personally viewed and evaluated these images (plain radiographs) as part of my medical decision making, as well as reviewing the written report by the radiologist.  ED MD interpretation: Some central vascular congestion  Official radiology report(s): DG Chest Portable 1 View  Result Date: 12/08/2020 CLINICAL DATA:  Short of breath, dizziness, bradycardia EXAM: PORTABLE CHEST 1 VIEW COMPARISON:  11/15/2020 FINDINGS: Single frontal view of the chest demonstrates external defibrillator pads overlying the left chest. Stable median sternotomy wires. Cardiac silhouette is unremarkable. Lung volumes are diminished, with increased central vascular congestion. Chronic right pleural thickening or trace pleural fluid unchanged. No pneumothorax. IMPRESSION: 1. Low lung volumes with increased central vascular congestion. No overt edema. Electronically Signed   By: Randa Ngo M.D.   On: 12/08/2020 15:58    ____________________________________________   PROCEDURES  Procedure(s) performed (including Critical Care):  .1-3 Lead EKG Interpretation Performed by: Vanessa [place], MD Authorized by:  Vanessa [place], MD     Interpretation: abnormal     ECG rate:  30   ECG rate assessment: bradycardic     Rhythm: sinus bradycardia     Ectopy: none     Conduction: normal   Comments:     Patient is in slow sinus bradycardia with heart rates in the 30s and after atropine goes up into the sinus with rates in the 60s. .Critical Care Performed by: Vanessa [employer], MD Authorized by: Vanessa [place], MD   Critical care provider statement:    Critical care time (minutes):  45   Critical care was necessary to treat or prevent imminent or life-threatening deterioration of the following conditions:  Cardiac failure   Critical care was time spent personally by me on the following activities:  Discussions with consultants, evaluation of patient's response to treatment, examination of patient, ordering and performing treatments and interventions, ordering and review of laboratory studies, ordering and review of radiographic studies, pulse oximetry, re-evaluation of patient's condition, obtaining history from patient or surrogate and review of old charts   ____________________________________________   INITIAL IMPRESSION / ASSESSMENT AND PLAN / ED COURSE  Tony Williams was evaluated in Emergency Department on 12/08/2020 for the symptoms described in the history of present illness. He was evaluated in the context of the global COVID-19 pandemic, which necessitated consideration that the patient might be at risk for infection with the SARS-CoV-2 virus that causes COVID-19. Institutional protocols and algorithms that pertain to the evaluation of patients at risk for COVID-19 are in a state of rapid change based on information released by regulatory bodies including the CDC and federal and state organizations. These policies and algorithms were followed during the patient's care in the ED.    Patient comes in with concerns for dizziness and noted to be in sinus bradycardia with rates in the 30s.  Patient was  given atropine with good response.  I suspect that this is secondary to his metoprolol and having taken  for the past 5 days.  Patient will need time for this to washout.  Patient was given 2 doses of atropine and discussed with the ICU Dr. Mortimer Fries who recommended doing some glucagon for concern for beta-blocker overdose.  If this is not successful I will I also got and Dr. Fay Records from cardiology involved who stated that we can start a dopamine drip.  Labs ordered to evaluate for any Electra abnormalities, AKI, chest x-ray to evaluate for any fluid overload.  Chest x-ray today IV fluid overload given hypotension patient was given 500 cc of fluid.  Afterwards patient did become a little hypoxic with 2 to 3 L.  I have lower suspicion for PE at this time but if he remains hypoxic he may need additional work-up for this.  This time I suspect that his symptoms are from his bradycardia and hypotension potentially underperfusion.  His troponin is 11 making ACS very unlikely and I have very low suspicion for large pulmonary embolism otherwise I expect to see this to be elevated.  He was hyperkalemic so given some Lokelma as well as some calcium in case this could also be contributing to the bradycardia.  Patient will be admitted to the ICU for cardiac monitoring secondary to episodes of bradycardia requiring atropine and potentially needing glucagon drip versus dopamine drip  ____________________________________________   FINAL CLINICAL IMPRESSION(S) / ED DIAGNOSES   Final diagnoses:  Symptomatic sinus bradycardia  Hypotension, unspecified hypotension type  Hyperkalemia      MEDICATIONS GIVEN DURING THIS VISIT:  Medications  DOPamine (INTROPIN) 800 mg in dextrose 5 % 250 mL (3.2 mg/mL) infusion (has no administration in time range)  calcium gluconate 1 g/ 50 mL sodium chloride IVPB (has no administration in time range)  sodium zirconium cyclosilicate (LOKELMA) packet 10 g (has no administration in time  range)  glucagon (GLUCAGEN)  IVPB (bolus for beta blocker/calcium channel bocker overdose) (has no administration in time range)  sodium chloride flush (NS) 0.9 % injection 3 mL (has no administration in time range)  sodium chloride flush (NS) 0.9 % injection 3 mL (has no administration in time range)  0.9 %  sodium chloride infusion (has no administration in time range)  acetaminophen (TYLENOL) tablet 650 mg (has no administration in time range)  docusate sodium (COLACE) capsule 100 mg (has no administration in time range)  polyethylene glycol (MIRALAX / GLYCOLAX) packet 17 g (has no administration in time range)  ondansetron (ZOFRAN) injection 4 mg (has no administration in time range)  famotidine (PEPCID) IVPB 20 mg in NS 100 mL IVPB (has no administration in time range)  heparin injection 5,000 Units (has no administration in time range)  atropine 1 MG/10ML injection (1 mg  Given 12/08/20 1518)  sodium chloride 0.9 % bolus 500 mL (0 mLs Intravenous Stopped 12/08/20 1557)  ondansetron (ZOFRAN) injection 4 mg (4 mg Intravenous Given 12/08/20 1540)  atropine 1 MG/10ML injection 1 mg (1 mg Intravenous Given 12/08/20 1543)     ED Discharge Orders     None        Note:  This document was prepared using Dragon voice recognition software and may include unintentional dictation errors.    Vanessa [place], MD 12/08/20 1700

## 2020-12-08 NOTE — Telephone Encounter (Signed)
Called over to rehab department to see if patient was still there. He was already gone. Patient stated his medications were decreased and he increased them back on his own. Medications were Amlodipine and Metoprolol. She states that this may be the issue. Will continue to try and call this patient back.

## 2020-12-08 NOTE — H&P (Signed)
NAME:  Jobany Montellano, MRN:  [id number], DOB:  10/27/1945, LOS: 0 ADMISSION DATE:  12/08/2020, CONSULTATION DATE:  12/08/20 REFERRING MD:  Dr. Jari Pigg, CHIEF COMPLAINT:  Dizziness, Bradycardia   Brief Pt Description / Synopsis:  75 y.o Male admitted with Symptomatic Bradycardia in the setting of suspected sick sinus syndrome with continued Metoprolol usage despite recommendation by his Cardiologist to reduce dosage.  History of Present Illness:  Tyse Auriemma is a 76 year old male with a past medical history significant for coronary artery disease status post CABG in 2007, NSTEMI in 2018 status post PCI x2, CKD stage III, hypertension, hyperlipidemia, COPD with prior tobacco abuse, morbid obesity, and OSA not on CPAP who presented to [facility] ED on 12/08/2020 from cardiac rehab due to complaints of dizziness and bradycardia. Denies any fevers or leg swelling or chest pain or abdominal pain.  Of note he was recently admitted to [facility] on 11/07/20 with intermittent angina occurring with exertion and at rest.  He ruled in for NSTEMI with troponin peaking at 3893, and for diastolic heart failure.  He was diuresed with resultant AKI leading to delay in left heart catheterization.  With gentle IV hydration his renal function improved, and on 11/11/2020 he underwent cardiac catheterization which demonstrated critical native coronary and bypass graft disease requiring IABP placement and transfer to [facility] due to high risk PCI.  On 11/12/2020 at [facility] he underwent PCI requiring drug-eluting stent to the SVG to LAD/diagonal and 2 separate lesions.  During that admission he was notable for bradycardia with 2-1 AV block of which recommendation was to discontinue Lopressor 25 mg.  During that admission he was rechallenged with lower dose metoprolol 12.5 mg, where he exhibited recurrence of bradycardia, increased fatigue, and exercise intolerance of which the metoprolol was again discontinued.  After discharge,  he  reported elevation in blood pressure with systolics greater than 734.  Given his hypertension he self resumed metoprolol 25 mg.  With resumption of metoprolol every morning he feels dizzy, short of breath and then throughout the day he starts feeling better.  Today he had the same symptoms that he took his heart rate and it was low into the 30s and he was hypotensive.  ED Course: Initial vital signs: Temperature 97.7 F orally, respiratory rate 20, pulse in the 30s, blood pressure 109/53, SPO2 92% on room air Significant labs: Potassium 5.6, BUN 51, creatinine 2.52, BNP 142.7, high-sensitivity troponin 11, WBC 13.1, TSH 2.5, free T4 0.82, glucose 146 Imaging: Chest x-ray>>1. Low lung volumes with increased central vascular congestion. No overt edema. EKG: Sinus bradycardia rate of 42, no ST elevation, no T wave inversions, normal intervals  Given his symptomatic bradycardia, he was given atropine x2 doses with good response, along with glucagon.  Cardiology was consulted and saw him in the ED with recommendation for Dopamine drip if needed.  He received 500 cc of IV fluids for hypotension, and following fluids became a little hypoxic requiring 2-3L of supplemental O2.  Given his Hyperkalemia, he was given calcium gluconate and Lokelma.  PCCM was contacted to admit the patient to ICU for further workup and treatment of symptomatic bradycardia.  Pertinent  Medical History  Coronary artery disease status post CABG in 2007 NSTEMI in 2018 status post PCI x2 Diabetes mellitus type 2 CKD stage III Hypertension Hyperlipidemia COPD OSA not on CPAP Morbid obesity  Micro Data:  12/08/2020: SARS-CoV-2 and influenza PCR>> negative  Antimicrobials:  N/A  Significant Hospital Events: Including procedures, antibiotic start  and stop dates in addition to other pertinent events   12/08/20: Presented to ED with symptomatic bradycardia.  Improved with 2 pushes of atropine.  PCCM asked to admit in case of  need for dopamine.  Cardiology consulted and saw in ED  Interim History / Subjective:  -Patient presented to ED due to complaints of dizziness and bradycardia -Heart rate found to be in the 30s and hypotensive ~improved with 2 pushes of atropine and glucagon -Heart rate currently in the 50s and blood pressure 135/48 -Reports he is feeling much better -On 2L [place] in no acute distress  Objective   Blood pressure (!) 110/46, pulse (!) 43, resp. rate 17, height $RemoveBe'5\' 8"'YkVpnmavr$  (1.727 m), weight 117.5 kg, SpO2 94 %.       No intake or output data in the 24 hours ending 12/08/20 1837 Filed Weights   12/08/20 1522  Weight: 117.5 kg    Examination: General: Acutely ill-appearing male, sitting in bed, on 2 L nasal cannula, no acute distress HENT: Atraumatic, normocephalic, neck supple, no JVD Lungs: Clear to auscultation throughout, no rales or wheezing noted, even, nonlabored, normal effort Cardiovascular: Bradycardia, regular rhythm, S1-S2, no murmurs, rubs, gallops, 2+ distal pulses Abdomen: Obese, soft, nontender, nondistended, no guarding rebound tenderness, bowel sounds positive x4 Extremities: Normal bulk and tone, no deformities, no edema Neuro: Awake, alert and oriented x4, follows commands and moves all extremities, no focal deficits, speech clear, pupils PERRLA GU: Deferred  Resolved Hospital Problem list     Assessment & Plan:   Symptomatic Bradycardia Chronic HFpEF without Acute Exacerbation PMHx of CAD s/p CABG and multiple PCI's, Hypertension, Hyperlipidemia -Continuous cardiac monitoring -Maintain MAP >65 -Gentle/cautious IV fluids (received 500 cc bolus in ED) -Prn Atropine  -Received Glucagon x1 -Dopamine infusion if needed to MAP >65 or HR >40 -Continue to hold home BB -Cardiology following, appreciate input -HS Troponin negative x2 (peaked at 11) -TSH normal @ 2.5 -BNP 142 -Holding home Torsemide for now given soft BP -Continue DAPT with Aspirin and Brilinta, and  Statin  AKI superimposed on CKD Stage III Hyperkalemia -Monitor I&O's / urinary output -Follow BMP -Ensure adequate renal perfusion -Avoid nephrotoxic agents as able -Replace electrolytes as indicated -Gentle IVF (received 500 cc bolus) -Received temporizing measures in ED: Calcium gluconate and Lokelma -Follow up BMP later this evening  Acute Hypoxic Respiratory Failure, ? Due to developing pulmonary edema (required supplemental O2 following 500 cc bolus in ED) PMHx of COPD, OSA not on CPAP -Supplemental O2 as needed to maintain O2 sats >88% -BiPAP if needed -Follow intermittent CXR & ABG as needed -Diuresis as BP and renal function permits ~ currently holding home Torsemide due to soft BP and AKI -PRN Bronchodilators -Ensure pulmonary hygiene  Diabetes Mellitus -CBG's ac & qhs; Target range of 140 to 180 -SSI -Follow ICU Hypo/Hyperglycemia protocol     Best Practice (right click and "Reselect all SmartList Selections" daily)   Diet/type: Regular consistency (see orders) DVT prophylaxis: prophylactic heparin  GI prophylaxis: N/A Lines: N/A Foley:  N/A Code Status:  full code Last date of multidisciplinary goals of care discussion [N/A]  Updated pt and his son at bedside 12/08/20.  All questions answered.  Labs   CBC: Recent Labs  Lab 12/08/20 1525  WBC 13.1*  NEUTROABS 9.0*  HGB 14.9  HCT 44.4  MCV 91.7  PLT 993    Basic Metabolic Panel: Recent Labs  Lab 12/08/20 1525  NA 136  K 5.6*  CL 101  CO2  27  GLUCOSE 146*  BUN 51*  CREATININE 2.52*  CALCIUM 9.4  MG 2.4   GFR: Estimated Creatinine Clearance: 32 mL/min (A) (by C-G formula based on SCr of 2.52 mg/dL (H)). Recent Labs  Lab 12/08/20 1525  WBC 13.1*    Liver Function Tests: Recent Labs  Lab 12/08/20 1525  AST 14*  ALT 13  ALKPHOS 45  BILITOT 0.7  PROT 7.3  ALBUMIN 3.8   No results for input(s): LIPASE, AMYLASE in the last 168 hours. No results for input(s): AMMONIA in the last  168 hours.  ABG No results found for: PHART, PCO2ART, PO2ART, HCO3, TCO2, ACIDBASEDEF, O2SAT   Coagulation Profile: No results for input(s): INR, PROTIME in the last 168 hours.  Cardiac Enzymes: No results for input(s): CKTOTAL, CKMB, CKMBINDEX, TROPONINI in the last 168 hours.  HbA1C: Hemoglobin A1C  Date/Time Value Ref Range Status  10/21/2020 02:27 PM 5.9 (A) 4.0 - 5.6 % Final   Hgb A1c MFr Bld  Date/Time Value Ref Range Status  04/22/2020 10:17 AM 6.4 (H) 4.8 - 5.6 % Final    Comment:             Prediabetes: 5.7 - 6.4          Diabetes: >6.4          Glycemic control for adults with diabetes: <7.0     CBG: No results for input(s): GLUCAP in the last 168 hours.  Review of Systems:   Positives in BOLD: Gen: Denies fever, chills, weight change, fatigue, night sweats HEENT: Denies blurred vision, double vision, hearing loss, tinnitus, sinus congestion, rhinorrhea, sore throat, neck stiffness, dysphagia PULM: Denies shortness of breath, cough, sputum production, hemoptysis, wheezing CV: Denies chest pain, edema, orthopnea, paroxysmal nocturnal dyspnea, palpitations GI: Denies abdominal pain, nausea, vomiting, diarrhea, hematochezia, melena, constipation, change in bowel habits GU: Denies dysuria, hematuria, polyuria, oliguria, urethral discharge Endocrine: Denies hot or cold intolerance, polyuria, polyphagia or appetite change Derm: Denies rash, dry skin, scaling or peeling skin change Heme: Denies easy bruising, bleeding, bleeding gums Neuro: Denies dizziness, headache, numbness, weakness, slurred speech, loss of memory or consciousness   Past Medical History:  He,  has a past medical history of CAD S/P percutaneous coronary angioplasty, CAD, multiple vessel, Cancer ([place]) (2013), Chronic kidney disease, COPD (chronic obstructive pulmonary disease) ([place]), Depression, Diabetes mellitus without complication ([place]), Heart problem, and Hx of CABG x 4.   Surgical History:    Past Surgical History:  Procedure Laterality Date   CHOLECYSTECTOMY, LAPAROSCOPIC  12/26/1995   COLONOSCOPY WITH PROPOFOL N/A 09/23/2020   Procedure: COLONOSCOPY WITH PROPOFOL;  Surgeon: Lucilla Lame, MD;  Location: [facility] ENDOSCOPY;  Service: Endoscopy;  Laterality: N/A;   CORONARY ARTERY BYPASS GRAFT     LIMA-D2, SVG-OM, SeqSVG-D1-LAD   CORONARY STENT INTERVENTION N/A 11/12/2020   Procedure: CORONARY STENT INTERVENTION;  Surgeon: Wellington Hampshire, MD;  Location: [place] [facility];  Service: Cardiovascular;  Laterality: N/A;   EYE SURGERY     IABP INSERTION N/A 11/11/2020   Procedure: IABP Insertion;  Surgeon: Nelva Bush, MD;  Location: [place] [facility];  Service: Cardiovascular;  Laterality: N/A;   IR STENT PLACEMENT ANTE CAROTID INC ANGIO     KNEE SURGERY     RIGHT/LEFT HEART CATH AND CORONARY/GRAFT ANGIOGRAPHY N/A 11/11/2020   Procedure: RIGHT/LEFT HEART CATH AND CORONARY/GRAFT ANGIOGRAPHY;  Surgeon: Nelva Bush, MD;  Location: [place] [facility];  Service: Cardiovascular;  Severe native CAD: 90% dLMCA-100% mLAD/100% ost LCx CTO  w/ Severe diffuse RCA disease. Widely patent LIMA-D1. Patent sequential SVG-D2-LAD with 99% mid graft & 90% LIMA-D2 @ anastomosis. previously stented SVG-OM - CTO. Mildly   TRANSURETHRAL RESECTION OF PROSTATE  1994   URINARY SPHINCTER IMPLANT  1995     Social History:   reports that he quit smoking about 17 years ago. His smoking use included cigarettes. He has never used smokeless tobacco. He reports that he does not drink alcohol and does not use drugs.   Family History:  His family history includes Heart Problems in his father and mother.   Allergies Allergies  Allergen Reactions   Butorphanol Itching   Morphine Other (See Comments)    Redness around IV site   Pedi-Pre Tape Spray [Wound Dressing Adhesive] Itching   Simvastatin Other (See Comments)    "Destroyed my muscles"   Statins Other (See Comments)    "Destroyed my muscles"      Home Medications  Prior to Admission medications   Medication Sig Start Date End Date Taking? Authorizing Provider  allopurinol (ZYLOPRIM) 300 MG tablet Take 1 tablet (300 mg total) by mouth daily. 07/21/20  Yes Bacigalupo, Dionne Bucy, MD  amLODipine (NORVASC) 10 MG tablet Take 0.5 tablets (5 mg total) by mouth daily. 11/20/20  Yes Kate Sable, MD  aspirin EC 81 MG tablet Take 81 mg by mouth daily. Swallow whole.   Yes [provider]  Cholecalciferol (VITAMIN D3 PO) Take 4,000 Units by mouth.   Yes [provider]  Coenzyme Q10 (CO Q-10) 400 MG CAPS Take by mouth daily at 6 (six) AM.   Yes [provider]  escitalopram (LEXAPRO) 20 MG tablet Take 1 tablet (20 mg total) by mouth daily. 09/29/20  Yes Bacigalupo, Dionne Bucy, MD  gabapentin (NEURONTIN) 300 MG capsule Take 2 capsules (600 mg total) by mouth at bedtime. 07/21/20  Yes Bacigalupo, Dionne Bucy, MD  lisinopril (ZESTRIL) 5 MG tablet Take 0.5 tablets (2.5 mg total) by mouth daily. 11/14/20  Yes O'Neal, Cassie Freer, MD  lovastatin (MEVACOR) 40 MG tablet Take 40 mg by mouth at bedtime.   Yes [provider]  Omega-3 Fatty Acids (FISH OIL) 1200 MG CAPS Take by mouth daily at 6 (six) AM.   Yes [provider]  ticagrelor (BRILINTA) 90 MG TABS tablet Take 1 tablet (90 mg total) by mouth 2 (two) times daily. 11/15/20  Yes O'Neal, Cassie Freer, MD  torsemide (DEMADEX) 20 MG tablet Take 1 tablet (20 mg total) by mouth 2 (two) times daily. 11/20/20 02/18/21 Yes Agbor-Etang, Aaron Edelman, MD  traZODone (DESYREL) 150 MG tablet Take 1-2 tablets (150-300 mg total) by mouth at bedtime as needed for sleep. 07/21/20  Yes Bacigalupo, Dionne Bucy, MD  albuterol (VENTOLIN HFA) 108 (90 Base) MCG/ACT inhaler Inhale 2 puffs into the lungs every 6 (six) hours as needed for wheezing or shortness of breath. 04/18/20   Flinchum, Kelby Aline, FNP  Bempedoic Acid-Ezetimibe (NEXLIZET) 180-10 MG TABS Take 1 tablet by mouth daily. Patient not  taking: No sig reported 11/20/20   Kate Sable, MD  blood glucose meter kit and supplies Dispense based on patient and insurance preference. Use up to four times daily as directed. (FOR ICD-10 E10.9, E11.9). check blood glucose fasting and before meals four times daily. 04/18/20   Flinchum, Kelby Aline, FNP  desoximetasone (TOPICORT) 0.25 % cream Apply 1 application topically 2 (two) times daily. 10/24/20   [provider]  fluticasone-salmeterol (ADVAIR HFA) 230-21 MCG/ACT inhaler Inhale 1-2 puffs into the  lungs 2 (two) times daily. Rinse mouth after each use. 04/18/20   Flinchum, Kelby Aline, FNP  nitroGLYCERIN (NITROSTAT) 0.4 MG SL tablet Place 1 tablet (0.4 mg total) under the tongue every 5 (five) minutes x 3 doses as needed for chest pain. 11/14/20   O'Neal, Cassie Freer, MD     Critical care time: 50 minutes     Darel Hong, AGACNP-BC [facility] Prefer epic messenger for cross cover needs If after hours, please call E-link

## 2020-12-08 NOTE — Consult Note (Signed)
Cardiology Consultation:   Patient ID: Tony Williams; [id number]; 1945-09-27   Admit date: 12/08/2020 Date of Consult: 12/08/2020  Primary Care Provider: Virginia Crews, MD Primary Cardiologist: Agbor-Etang Primary Electrophysiologist:  None   Patient Profile:   Tony Williams is a 75 y.o. male with a hx of CAD status post four-vessel CABG in 2007 with NSTEMI in 2018 status PCI x2 status post PCI/DES to the mid SVG to LAD/diagonal, DM2, CKD stage III, HTN, HLD with statin intolerance, possible COPD with prior tobacco use quitting greater than 20 years ago, morbid obesity, and OSA not on CPAP  who is being seen today for the evaluation of symptomatic bradycardia at the request of Dr. Jari Pigg.  History of Present Illness:   [name]  was previously followed by [facility].  While in [place], he underwent four-vessel CABG in 2007. He was admitted with an NSTEMI in 2018 and had 2 stents placed.  Upon moving to the [place] area in 08/2019, he established with Dr. Garen Lah.  Echo in 05/2020 showed an EF of 60 to 65%, no regional wall motion abnormalities, indeterminate LV diastolic function parameters, low normal RV systolic function, and no significant valvular abnormalities.  Lexiscan MPI showed a large defect of severe severity present in the basal inferior, basal inferior lateral, mid inferior, and mid inferolateral location.  Findings were consistent with prior MI with moderate peri-infarct ischemia.  EF was 45%.  Overall, this was an intermediate risk study.   He was admitted to [facility] on 8/26 with intermittent angina that was occurring with exertion and at rest.  CTA of the chest in the ED was negative for PE with right lung volume loss with lower lobe fibrothorax noted.  He ruled in for an NSTEMI with troponin trending to 1117.  Echo on 8/27 showed an EF of 50 to 55%, apical hypokinesis, mildly dilated LV internal cavity size, grade 2 diastolic dysfunction,  moderately dilated left atrium, and had overall challenging images.  He was IV diuresed with noted AKI leading to delay in [place].  With hydration, his renal function subsequently improved.  He underwent LHC on 8/30 which demonstrated critical native coronary and bypass graft disease requiring IABP placement and recommendation for the patient to be transferred to [facility] for high risk PCI.  He underwent repeat LHC on 8/31 with PCI/DES to the SVG to LAD/diagonal and 2 separate lesions.  IABP was removed on 8/31 after heparin washout.  Admission was notable for bradycardia with 2-1 AV block leading to the recommendation to discontinue Lopressor 25 mg.  While admitted, he was rechallenged with lower dose Lopressor 12.5 mg, though exhibited recurrence of bradycardia, increased fatigue, and exercise intolerance leading this to be discontinued again.  Echo during his admission at [facility] showed an EF of 60 to 65%, no regional wall motion abnormalities, and normal RV cavity size.  He was seen in the office on 11/20/2020 with a BP of 110/50 and heart rate 57 bpm.  It was recommended he decrease his amlodipine to 5 mg daily.  Following this, he reported elevations in BP with systolics greater than 675 mmHg.  Given this, he self resumed metoprolol 25 mg and titrated amlodipine to 10 mg.  With this, he became dizzy and fatigued with associated heart rates in the 30s to 40s bpm prompting him to come to the ED today.  In the ED, he has been noted to be in marked sinus bradycardia with heart rates in  the 30s to 40s bpm.  BP has ranged from the 80s to low 096E mmHg systolic.  High-sensitivity troponin negative in the ED, TSH normal, potassium 5.6, BUN 51, serum creatinine 2.52, magnesium 2.4, BNP 142.  He received atropine at 1518 and 1543 with noted improvement in heart rate to the mid 50s bpm.  BP has remained stable in the low 100s bpm.  He does feel less dizzy at this time.  No chest pain.  He notes chronic stable dyspnea.   No lower extremity swelling.   Past Medical History:  Diagnosis Date   CAD S/P percutaneous coronary angioplasty    CAD, multiple vessel    Cancer ([place]) 2013   Bladder   Chronic kidney disease    COPD (chronic obstructive pulmonary disease) (HCC)    Depression    Diabetes mellitus without complication (HCC)    Heart problem    Hx of CABG x 4    LIMA-D2, SVG-OM (occluded), SeqSVG-D1-LAD.    Past Surgical History:  Procedure Laterality Date   CHOLECYSTECTOMY, LAPAROSCOPIC  12/26/1995   COLONOSCOPY WITH PROPOFOL N/A 09/23/2020   Procedure: COLONOSCOPY WITH PROPOFOL;  Surgeon: Lucilla Lame, MD;  Location: [facility] ENDOSCOPY;  Service: Endoscopy;  Laterality: N/A;   CORONARY ARTERY BYPASS GRAFT     LIMA-D2, SVG-OM, SeqSVG-D1-LAD   CORONARY STENT INTERVENTION N/A 11/12/2020   Procedure: CORONARY STENT INTERVENTION;  Surgeon: Wellington Hampshire, MD;  Location: [place] CV LAB;  Service: Cardiovascular;  Laterality: N/A;   EYE SURGERY     IABP INSERTION N/A 11/11/2020   Procedure: IABP Insertion;  Surgeon: Nelva Bush, MD;  Location: [place] CV LAB;  Service: Cardiovascular;  Laterality: N/A;   IR STENT PLACEMENT ANTE CAROTID INC ANGIO     KNEE SURGERY     RIGHT/LEFT HEART CATH AND CORONARY/GRAFT ANGIOGRAPHY N/A 11/11/2020   Procedure: RIGHT/LEFT HEART CATH AND CORONARY/GRAFT ANGIOGRAPHY;  Surgeon: Nelva Bush, MD;  Location: [place] CV LAB;  Service: Cardiovascular;  Severe native CAD: 90% dLMCA-100% mLAD/100% ost LCx CTO w/ Severe diffuse RCA disease. Widely patent LIMA-D1. Patent sequential SVG-D2-LAD with 99% mid graft & 90% LIMA-D2 @ anastomosis. previously stented SVG-OM - CTO. Mildly   TRANSURETHRAL RESECTION OF PROSTATE  1994   URINARY SPHINCTER IMPLANT  1995     Home Meds: Prior to Admission medications   Medication Sig Start Date End Date Taking? Authorizing Provider  albuterol (VENTOLIN HFA) 108 (90 Base) MCG/ACT inhaler Inhale 2 puffs into the lungs  every 6 (six) hours as needed for wheezing or shortness of breath. 04/18/20   Flinchum, Kelby Aline, FNP  allopurinol (ZYLOPRIM) 300 MG tablet Take 1 tablet (300 mg total) by mouth daily. 07/21/20   Virginia Crews, MD  amLODipine (NORVASC) 10 MG tablet Take 0.5 tablets (5 mg total) by mouth daily. 11/20/20   Kate Sable, MD  aspirin EC 81 MG tablet Take 81 mg by mouth daily. Swallow whole.    [provider]  Bempedoic Acid-Ezetimibe (NEXLIZET) 180-10 MG TABS Take 1 tablet by mouth daily. 11/20/20   Kate Sable, MD  blood glucose meter kit and supplies Dispense based on patient and insurance preference. Use up to four times daily as directed. (FOR ICD-10 E10.9, E11.9). check blood glucose fasting and before meals four times daily. 04/18/20   Flinchum, Kelby Aline, FNP  Cholecalciferol (VITAMIN D3 PO) Take 4,000 Units by mouth.    [provider]  Coenzyme Q10 (CO Q-10) 400 MG CAPS Take by mouth daily at  6 (six) AM.    [provider]  desoximetasone (TOPICORT) 0.25 % cream Apply 1 application topically 2 (two) times daily. 10/24/20   [provider]  escitalopram (LEXAPRO) 20 MG tablet Take 1 tablet (20 mg total) by mouth daily. 09/29/20   Virginia Crews, MD  fluticasone-salmeterol (ADVAIR HFA) [phone] MCG/ACT inhaler Inhale 1-2 puffs into the lungs 2 (two) times daily. Rinse mouth after each use. 04/18/20   Flinchum, Kelby Aline, FNP  gabapentin (NEURONTIN) 300 MG capsule Take 2 capsules (600 mg total) by mouth at bedtime. 07/21/20   Virginia Crews, MD  lisinopril (ZESTRIL) 5 MG tablet Take 0.5 tablets (2.5 mg total) by mouth daily. 11/14/20   O'NealCassie Freer, MD  lovastatin (MEVACOR) 40 MG tablet Take 40 mg by mouth at bedtime.    [provider]  nitroGLYCERIN (NITROSTAT) 0.4 MG SL tablet Place 1 tablet (0.4 mg total) under the tongue every 5 (five) minutes x 3 doses as needed for chest pain. 11/14/20   O'Neal, Cassie Freer, MD  Omega-3  Fatty Acids (FISH OIL) 1200 MG CAPS Take by mouth daily at 6 (six) AM.    [provider]  ticagrelor (BRILINTA) 90 MG TABS tablet Take 1 tablet (90 mg total) by mouth 2 (two) times daily. 11/15/20   Geralynn Rile, MD  torsemide (DEMADEX) 20 MG tablet Take 1 tablet (20 mg total) by mouth 2 (two) times daily. 11/20/20 02/18/21  Kate Sable, MD  traZODone (DESYREL) 150 MG tablet Take 1-2 tablets (150-300 mg total) by mouth at bedtime as needed for sleep. 07/21/20   Virginia Crews, MD    Inpatient Medications: Scheduled Meds:  Continuous Infusions:  DOPamine     glucagon (GLUCAGEN)  IVPB (bolus for beta blocker/calcium channel bocker overdose)     PRN Meds:   Allergies:   Allergies  Allergen Reactions   Butorphanol Itching   Morphine Other (See Comments)    Redness around IV site   Pedi-Pre Tape Spray [Wound Dressing Adhesive] Itching   Simvastatin Other (See Comments)    "Destroyed my muscles"   Statins Other (See Comments)    "Destroyed my muscles"    Social History:   Social History   Socioeconomic History   Marital status: Married    Spouse name: Not on file   Number of children: 2   Years of education: Not on file   Highest education level: High school graduate  Occupational History   Occupation: retired  Tobacco Use   Smoking status: Former    Types: Cigarettes    Quit date: 10/23/2003    Years since quitting: 17.1   Smokeless tobacco: Never  Vaping Use   Vaping Use: Never used  Substance and Sexual Activity   Alcohol use: Never   Drug use: Never   Sexual activity: Not on file  Other Topics Concern   Not on file  Social History Narrative   Not on file   Social Determinants of Health   Financial Resource Strain: Low Risk    Difficulty of Paying Living Expenses: Not hard at all  Food Insecurity: No Food Insecurity   Worried About Charity fundraiser in the Last Year: Never true   [place] in the Last Year: Never true   Transportation Needs: No Transportation Needs   Lack of Transportation (Medical): No   Lack of Transportation (Non-Medical): No  Physical Activity: Inactive   Days of Exercise per Week: 0 days  Minutes of Exercise per Session: 0 min  Stress: No Stress Concern Present   Feeling of Stress : Not at all  Social Connections: Moderately Isolated   Frequency of Communication with Friends and Family: More than three times a week   Frequency of Social Gatherings with Friends and Family: Three times a week   Attends Religious Services: Never   Active Member of Clubs or Organizations: No   Attends Archivist Meetings: Never   Marital Status: Married  Human resources officer Violence: Not At Risk   Fear of Current or Ex-Partner: No   Emotionally Abused: No   Physically Abused: No   Sexually Abused: No     Family History:   Family History  Problem Relation Age of Onset   Heart Problems Mother    Heart Problems Father     ROS:  Review of Systems  Constitutional:  Positive for malaise/fatigue. Negative for chills, diaphoresis, fever and weight loss.  HENT:  Negative for congestion.   Eyes:  Negative for discharge and redness.  Respiratory:  Negative for cough, sputum production, shortness of breath and wheezing.   Cardiovascular:  Negative for chest pain, palpitations, orthopnea, claudication, leg swelling and PND.  Gastrointestinal:  Negative for abdominal pain, blood in stool, heartburn, melena, nausea and vomiting.  Musculoskeletal:  Negative for falls and myalgias.  Skin:  Negative for rash.  Neurological:  Positive for dizziness and weakness. Negative for tingling, tremors, sensory change, speech change, focal weakness and loss of consciousness.  Endo/Heme/Allergies:  Does not bruise/bleed easily.  Psychiatric/Behavioral:  Negative for substance abuse. The patient is not nervous/anxious.   All other systems reviewed and are negative.    Physical Exam/Data:   Vitals:    12/08/20 1515 12/08/20 1522 12/08/20 1530 12/08/20 1545  BP: (!) 111/48  (!) 86/47 (!) 114/57  Resp: 16  17 17   Weight:  117.5 kg    Height:  5' 8"  (1.727 m)     No intake or output data in the 24 hours ending 12/08/20 1602 Filed Weights   12/08/20 1522  Weight: 117.5 kg   Body mass index is 39.38 kg/m.   Physical Exam: General: Well developed, well nourished, in no acute distress. Head: Normocephalic, atraumatic, sclera non-icteric, no xanthomas, nares without discharge.  Neck: Negative for carotid bruits. JVD not elevated. Lungs: Clear bilaterally to auscultation without wheezes, rales, or rhonchi. Breathing is unlabored. Heart: Bradycardia with S1 S2. No murmurs, rubs, or gallops appreciated. Abdomen: Soft, non-tender, non-distended with normoactive bowel sounds. No hepatomegaly. No rebound/guarding. No obvious abdominal masses. Msk:  Strength and tone appear normal for age. Extremities: No clubbing or cyanosis. No edema. Distal pedal pulses are 2+ and equal bilaterally. Neuro: Alert and oriented X 3. No facial asymmetry. No focal deficit. Moves all extremities spontaneously. Psych:  Responds to questions appropriately with a normal affect.   EKG:  The EKG was personally reviewed and demonstrates: Marked sinus bradycardia, 42 bpm, nonspecific ST-T changes Telemetry:  Telemetry was personally reviewed and demonstrates: Sinus bradycardia with rates in the 40s to mid 50s bpm  Weights: Filed Weights   12/08/20 1522  Weight: 117.5 kg    Relevant CV Studies:  Lexiscan MPI 05/21/2020 T wave inversion was noted during stress in the aVL leads. There was no ST segment deviation noted during stress. Defect 1: There is a large defect of severe severity present in the basal inferior, basal inferolateral, mid inferior and mid inferolateral location. Findings consistent with prior myocardial infarction with  moderate peri-infarct ischemia. This is an intermediate risk study. Nuclear  stress EF: 45%. CT attenuation images show evidence of moderate aortic and coronary calcifications. __________   2D echo 05/22/2020: 1. Left ventricular ejection fraction, by estimation, is 60 to 65%. The  left ventricle has normal function. The left ventricle has no regional  wall motion abnormalities. Left ventricular diastolic parameters are  indeterminate.   2. Right ventricular systolic function is low normal. The right  ventricular size is not well visualized.   3. The mitral valve is normal in structure. No evidence of mitral valve  regurgitation.   4. The aortic valve was not well visualized. Aortic valve regurgitation  is trivial. __________   2D echo 11/08/2020:  1. Left ventricular ejection fraction, by estimation, is 50 to 55%. The  left ventricle has low normal function. The left ventricle demonstrates  regional wall motion abnormalities (apical hypokinesis). The left  ventricular internal cavity size was mildly   dilated. Left ventricular diastolic parameters are consistent with Grade  II diastolic dysfunction (pseudonormalization).   2. Right ventricular systolic function was not well visualized. The right  ventricular size is not well visualized.   3. Left atrial size was moderately dilated.   4. Challenging images __________   [facility] 11/11/2020: Conclusions: Severe native coronary artery disease including 90% distal LMCA stenosis, 100% mid LAD occlusion, 100% ostial LCx occlusion, and severe diffuse RCA disease. Widely patent LIMA-D1. Patent sequential SVG-D2-LAD with 99% mid graft stenosis as well as 90% stenosis at the LIMA-D2 anastomosis. Chronically occluded, previously stented SVG-OM. Mildly elevated left and right heart filling pressures. Moderate pulmonary hypertension. Normal-supranormal Fick cardiac output/index. Successful placement of 50 mL intra-aortic balloon pump via the right common femoral artery.   Recommendations: Images reviewed with Dr. Fletcher Anon.   We agree that patient is high risk for revascularization given his severe native and graft disease.  His distal targets are small and diffusely diseased, making them suboptimal for redo-CABG.  We will transfer to [facility] for possible high risk PCI to SVG-D2-LAD tomorrow. Titrate IV nitroglycerin for relief of chest pain. Aggressive secondary prevention. __________  LHC 11/12/2020:   Dist LM lesion is 90% stenosed.   Mid LAD lesion is 100% stenosed.   Ost Cx to Prox Cx lesion is 100% stenosed.   Dist Graft lesion before 2nd Diag  is 99% stenosed.   Prox Graft lesion between 2nd Diag and Mid LAD  is 90% stenosed.   1st Diag lesion is 100% stenosed.   Insertion lesion before 2nd Diag  is 90% stenosed.   A drug-eluting stent was successfully placed using a STENT ONYX FRONTIER 4.0X15.   A drug-eluting stent was successfully placed using a STENT ONYX FRONTIER 4.0X15.   Post intervention, there is a 0% residual stenosis.   Post intervention, there is a 0% residual stenosis.   SVG and is large.   Successful drug-eluting stent placement to the mid SVG to LAD/diagonal using distal protection device. Successful drug-eluting stent placement to the distal SVG to LAD/diagonal without distal protection device due to distal location.   Recommendations: Dual antiplatelet therapy indefinitely. Aggressive treatment of risk factors. Recommend removing intra-aortic balloon pump at 8:30 PM to allow washout of heparin. __________  2D echo 11/16/2020: 1. Left ventricular ejection fraction, by estimation, is 60 to 65%. The  left ventricle has normal function. The left ventricle has no regional  wall motion abnormalities. Left ventricular diastolic function could not  be evaluated.   2. The right  ventricular size is normal.    Laboratory Data:  ChemistryNo results for input(s): NA, K, CL, CO2, GLUCOSE, BUN, CREATININE, CALCIUM, GFRNONAA, GFRAA, ANIONGAP in the last 168 hours.  No results for input(s):  PROT, ALBUMIN, AST, ALT, ALKPHOS, BILITOT in the last 168 hours. Hematology Recent Labs  Lab 12/08/20 1525  WBC 13.1*  RBC 4.84  HGB 14.9  HCT 44.4  MCV 91.7  MCH 30.8  MCHC 33.6  RDW 14.4  PLT 231   Cardiac EnzymesNo results for input(s): TROPONINI in the last 168 hours. No results for input(s): TROPIPOC in the last 168 hours.  BNPNo results for input(s): BNP, PROBNP in the last 168 hours.  DDimer No results for input(s): DDIMER in the last 168 hours.  Radiology/Studies:  DG Chest Portable 1 View  Result Date: 12/08/2020 IMPRESSION: 1. Low lung volumes with increased central vascular congestion. No overt edema. Electronically Signed   By: Randa Ngo M.D.   On: 12/08/2020 15:58    Assessment and Plan:   1.  Symptomatic bradycardia: -Likely in the setting of the patient self resuming metoprolol after this was previously discontinued at [facility] due to symptomatic bradycardia and 2-1 AV block -In ED, he has received 2 doses of atropine as outlined above with improvement in heart rates to the mid 50s bpm -If he drops back down into the 30s to 40s bpm again the plan is for glucagon -He does have a dopamine infusion ready if needed -If his significant bradycardic rates return despite treatment as outlined above, or if he remains hypotensive would recommend dopamine infusion -Continue to allow metoprolol washout -Pacer pads in place -Atropine at bedside -Continue to avoid beta-blockers and nondihydropyridine calcium channel blocker -If significant bradycardia persists following beta-blocker washout, recommend EP evaluation for consideration of PPM  2.  CAD status post CABG status post subsequent multiple PCI: -Never with chest pain -High-sensitivity troponin negative x1 -Chronic stable dyspnea -Continue DAPT with ASA and Brilinta without interruption  -Beta-blocker previously held during last admission given symptomatic bradycardia and 2-1 AV block, recommend to continue to  hold beta-blocker -Continue statin  3.  HFpEF: -He does not appear grossly volume up -BNP only 142 -Hold torsemide as outlined below  4.  HTN: -Blood pressure soft in the ED as outlined above -He did receive 500 cc normal saline bolus in the ED -If he becomes significantly hypertensive, would recommend hydralazine  5.  HLD: -LDL 47 -Historically, he has been intolerant to statins and was recently initiated on bempedoic acid -Outpatient follow-up  6.  Hyperkalemia: -Repeat potassium -Further management per primary service  7.  Acute on CKD stage III: -Hold torsemide -Status post 500 cc normal saline bolus -Renal artery Duplex as outpatient    For questions or updates, please contact [place] Please consult [email or web address] for contact info under Cardiology/STEMI.   Signed, Christell Faith, PA-C [place] Pager: [phone] 12/08/2020, 4:02 PM

## 2020-12-08 NOTE — Plan of Care (Signed)
Patient awake and alert, denies sob or pain.

## 2020-12-08 NOTE — Telephone Encounter (Signed)
[facility] calling in regards to patient Patient scheduled to see Elenor Quinones tomorrow 09/27 at 10am  STAT if HR is under 50 or over 120 (normal HR is 60-100 beats per minute)  What is your heart rate? 37  Do you have a log of your heart rate readings (document readings)?  BP 94/40  Do you have any other symptoms? Dizzy, SOB, can't focus

## 2020-12-08 NOTE — ED Triage Notes (Signed)
Per Pt, I was on my way to cardiac rehab and I felt dizzy and like I was going to pass out.

## 2020-12-08 NOTE — Progress Notes (Signed)
Incomplete Session Note  Patient Details  Name: Tony Williams MRN: [id number] Date of Birth: 1945/08/15 Referring Provider:    [name] did not complete his rehab session.  [name] arrived for his [employer] and gym orientation today but upon arrival he reported extra dizziness, SOB, and lack of focus today. Vital signs were HR 38, and BP 94/40. Patient stated that his doctor had cut his Metoprolol and Amlodipine in half but his BP got into the 200's so he started taking the full amount of these 2 medications again without consulting his doctor. Doctor's office was called and he was advised to go to the ED. Orientation will be rescheduled once patient is stable.

## 2020-12-09 ENCOUNTER — Inpatient Hospital Stay: Payer: Medicare Other

## 2020-12-09 ENCOUNTER — Ambulatory Visit: Payer: Medicare Other | Admitting: Physician Assistant

## 2020-12-09 DIAGNOSIS — I2583 Coronary atherosclerosis due to lipid rich plaque: Secondary | ICD-10-CM

## 2020-12-09 DIAGNOSIS — R001 Bradycardia, unspecified: Secondary | ICD-10-CM

## 2020-12-09 DIAGNOSIS — I2510 Atherosclerotic heart disease of native coronary artery without angina pectoris: Secondary | ICD-10-CM

## 2020-12-09 LAB — BASIC METABOLIC PANEL
Anion gap: 8 (ref 5–15)
BUN: 49 mg/dL — ABNORMAL HIGH (ref 8–23)
CO2: 25 mmol/L (ref 22–32)
Calcium: 8.6 mg/dL — ABNORMAL LOW (ref 8.9–10.3)
Chloride: 103 mmol/L (ref 98–111)
Creatinine, Ser: 2.47 mg/dL — ABNORMAL HIGH (ref 0.61–1.24)
GFR, Estimated: 27 mL/min — ABNORMAL LOW (ref >60)
Glucose, Bld: 147 mg/dL — ABNORMAL HIGH (ref 70–99)
Potassium: 4.4 mmol/L (ref 3.5–5.1)
Sodium: 136 mmol/L (ref 135–145)

## 2020-12-09 LAB — CBC
HCT: 40.2 % (ref 39.0–52.0)
Hemoglobin: 12.9 g/dL — ABNORMAL LOW (ref 13.0–17.0)
MCH: 29.9 pg (ref 26.0–34.0)
MCHC: 32.1 g/dL (ref 30.0–36.0)
MCV: 93.3 fL (ref 80.0–100.0)
Platelets: 195 10*3/uL (ref 150–400)
RBC: 4.31 MIL/uL (ref 4.22–5.81)
RDW: 14.6 % (ref 11.5–15.5)
WBC: 10.5 10*3/uL (ref 4.0–10.5)
nRBC: 0 % (ref 0.0–0.2)

## 2020-12-09 LAB — GLUCOSE, CAPILLARY
Glucose-Capillary: 157 mg/dL — ABNORMAL HIGH (ref 70–99)
Glucose-Capillary: 122 mg/dL — ABNORMAL HIGH (ref 70–99)
Glucose-Capillary: 141 mg/dL — ABNORMAL HIGH (ref 70–99)
Glucose-Capillary: 145 mg/dL — ABNORMAL HIGH (ref 70–99)
Glucose-Capillary: 142 mg/dL — ABNORMAL HIGH (ref 70–99)

## 2020-12-09 MED ORDER — HYDROCODONE-ACETAMINOPHEN 7.5-325 MG PO TABS
1.0000 | ORAL_TABLET | Freq: Once | ORAL | Status: AC
Start: 2020-12-09 — End: 2020-12-09
  Administered 2020-12-09: 1 via ORAL
  Filled 2020-12-09: qty 1

## 2020-12-09 NOTE — Hospital Course (Addendum)
75 year old male with a known history of CAD status post CABG, non-STEMI status post PCI, CKD stage IIIa, essential hypertension, hyperlipidemia, COPD, morbid obesity, OSA not on CPAP is admitted for symptomatic bradycardia.  9/28: Transferred from ICU to [facility]. Metoprolol wash off till tomorrow and decide need for pacemaker then per cardiology. 9/29: Pending EP eval

## 2020-12-09 NOTE — Progress Notes (Signed)
Progress Note  Patient Name: Tony Williams Date of Encounter: 12/09/2020  Primary Cardiologist: Agbor-Etang  Subjective   Continues to note fatigue, though improved when compared to yesterday. No chest pain or dizziness. He continues to report dyspnea that is unchanged and dates back to his PCI at [facility] at the end of August. Last dose of Lopressor 25 mg was on 9/25 around 10 AM. He received two doses of atropine in the ED yesterday and 1 dose of glucagon last evening. Heart rates predominantly in the 50s with some readings in the mid to upper 40s and into the mid 60s bpm. BP stable in the 532Y mmHg systolic.   Inpatient Medications    Scheduled Meds:  aspirin EC  81 mg Oral Daily   Chlorhexidine Gluconate Cloth  6 each Topical Q0600   famotidine (PEPCID) IV  20 mg Intravenous Q12H   heparin  5,000 Units Subcutaneous Q8H   influenza vaccine adjuvanted  0.5 mL Intramuscular Tomorrow-1000   insulin aspart  0-15 Units Subcutaneous TID WC   insulin aspart  0-5 Units Subcutaneous QHS   mometasone-formoterol  2 puff Inhalation BID   sodium chloride flush  3 mL Intravenous Q12H   sodium zirconium cyclosilicate  10 g Oral Once   ticagrelor  90 mg Oral BID   Continuous Infusions:  sodium chloride     DOPamine     PRN Meds: sodium chloride, acetaminophen, docusate sodium, HYDROcodone-acetaminophen, melatonin, ondansetron (ZOFRAN) IV, polyethylene glycol, sodium chloride flush   Vital Signs    Vitals:   12/09/20 0500 12/09/20 0600 12/09/20 0700 12/09/20 0720  BP: (!) 107/50 (!) 130/59 (!) 121/55 (!) 121/55  Pulse: (!) 44 (!) 48 (!) 44 (!) 46  Resp: 13 12 14 14   Temp:    97.9 F (36.6 C)  TempSrc:    Oral  SpO2: 93% 97% 95% 94%  Weight:      Height:        Intake/Output Summary (Last 24 hours) at 12/09/2020 0803 Last data filed at 12/09/2020 0500 Gross per 24 hour  Intake 480 ml  Output 350 ml  Net 130 ml   Filed Weights   12/08/20 1522 12/08/20 1850  Weight:  117.5 kg 118.8 kg    Telemetry    Sinus bradycardia with rates predominantly in the 50s bpm with some readings into the 40s and up to the mid 60s bpm - Personally Reviewed  ECG    No new tracings - Personally Reviewed  Physical Exam   GEN: No acute distress.   Neck: No JVD. Cardiac: Bradycardic, no murmurs, rubs, or gallops.  Respiratory: Clear to auscultation bilaterally.  GI: Soft, nontender, non-distended.   MS: No edema; No deformity. Neuro:  Alert and oriented x 3; Nonfocal.  Psych: Normal affect.  Labs    Chemistry Recent Labs  Lab 12/08/20 1525 12/08/20 2156 12/09/20 0520  NA 136 135 136  K 5.6* 5.2* 4.4  CL 101 100 103  CO2 27 26 25   GLUCOSE 146* 208* 147*  BUN 51* 48* 49*  CREATININE 2.52* 2.40* 2.47*  CALCIUM 9.4 8.7* 8.6*  PROT 7.3  --   --   ALBUMIN 3.8  --   --   AST 14*  --   --   ALT 13  --   --   ALKPHOS 45  --   --   BILITOT 0.7  --   --   GFRNONAA 26* 28* 27*  ANIONGAP 8 9 8  Hematology Recent Labs  Lab 12/08/20 1525 12/09/20 0520  WBC 13.1* 10.5  RBC 4.84 4.31  HGB 14.9 12.9*  HCT 44.4 40.2  MCV 91.7 93.3  MCH 30.8 29.9  MCHC 33.6 32.1  RDW 14.4 14.6  PLT 231 195    Cardiac EnzymesNo results for input(s): TROPONINI in the last 168 hours. No results for input(s): TROPIPOC in the last 168 hours.   BNP Recent Labs  Lab 12/08/20 1525  BNP 142.7*     DDimer No results for input(s): DDIMER in the last 168 hours.   Radiology    DG Chest Portable 1 View  Result Date: 12/08/2020 IMPRESSION: 1. Low lung volumes with increased central vascular congestion. No overt edema. Electronically Signed   By: Randa Ngo M.D.   On: 12/08/2020 15:58    Cardiac Studies   Lexiscan MPI 05/21/2020 T wave inversion was noted during stress in the aVL leads. There was no ST segment deviation noted during stress. Defect 1: There is a large defect of severe severity present in the basal inferior, basal inferolateral, mid inferior and mid  inferolateral location. Findings consistent with prior myocardial infarction with moderate peri-infarct ischemia. This is an intermediate risk study. Nuclear stress EF: 45%. CT attenuation images show evidence of moderate aortic and coronary calcifications. __________   2D echo 05/22/2020: 1. Left ventricular ejection fraction, by estimation, is 60 to 65%. The  left ventricle has normal function. The left ventricle has no regional  wall motion abnormalities. Left ventricular diastolic parameters are  indeterminate.   2. Right ventricular systolic function is low normal. The right  ventricular size is not well visualized.   3. The mitral valve is normal in structure. No evidence of mitral valve  regurgitation.   4. The aortic valve was not well visualized. Aortic valve regurgitation  is trivial. __________   2D echo 11/08/2020:  1. Left ventricular ejection fraction, by estimation, is 50 to 55%. The  left ventricle has low normal function. The left ventricle demonstrates  regional wall motion abnormalities (apical hypokinesis). The left  ventricular internal cavity size was mildly   dilated. Left ventricular diastolic parameters are consistent with Grade  II diastolic dysfunction (pseudonormalization).   2. Right ventricular systolic function was not well visualized. The right  ventricular size is not well visualized.   3. Left atrial size was moderately dilated.   4. Challenging images __________   [employer] 11/11/2020: Conclusions: Severe native coronary artery disease including 90% distal LMCA stenosis, 100% mid LAD occlusion, 100% ostial LCx occlusion, and severe diffuse RCA disease. Widely patent LIMA-D1. Patent sequential SVG-D2-LAD with 99% mid graft stenosis as well as 90% stenosis at the LIMA-D2 anastomosis. Chronically occluded, previously stented SVG-OM. Mildly elevated left and right heart filling pressures. Moderate pulmonary hypertension. Normal-supranormal Fick cardiac  output/index. Successful placement of 50 mL intra-aortic balloon pump via the right common femoral artery.   Recommendations: Images reviewed with Dr. Fletcher Anon.  We agree that patient is high risk for revascularization given his severe native and graft disease.  His distal targets are small and diffusely diseased, making them suboptimal for redo-CABG.  We will transfer to [facility] for possible high risk PCI to SVG-D2-LAD tomorrow. Titrate IV nitroglycerin for relief of chest pain. Aggressive secondary prevention. __________   LHC 11/12/2020:   Dist LM lesion is 90% stenosed.   Mid LAD lesion is 100% stenosed.   Ost Cx to Prox Cx lesion is 100% stenosed.   Dist Graft lesion before 2nd  Diag  is 99% stenosed.   Prox Graft lesion between 2nd Diag and Mid LAD  is 90% stenosed.   1st Diag lesion is 100% stenosed.   Insertion lesion before 2nd Diag  is 90% stenosed.   A drug-eluting stent was successfully placed using a STENT ONYX FRONTIER 4.0X15.   A drug-eluting stent was successfully placed using a STENT ONYX FRONTIER 4.0X15.   Post intervention, there is a 0% residual stenosis.   Post intervention, there is a 0% residual stenosis.   SVG and is large.   Successful drug-eluting stent placement to the mid SVG to LAD/diagonal using distal protection device. Successful drug-eluting stent placement to the distal SVG to LAD/diagonal without distal protection device due to distal location.   Recommendations: Dual antiplatelet therapy indefinitely. Aggressive treatment of risk factors. Recommend removing intra-aortic balloon pump at 8:30 PM to allow washout of heparin. __________   2D echo 11/16/2020: 1. Left ventricular ejection fraction, by estimation, is 60 to 65%. The  left ventricle has normal function. The left ventricle has no regional  wall motion abnormalities. Left ventricular diastolic function could not  be evaluated.   2. The right ventricular size is normal.   Patient Profile      75 y.o. male with history of CAD status post four-vessel CABG in 2007 with NSTEMI in 2018 status PCI x2 status post PCI/DES to the mid SVG to LAD/diagonal, DM2, CKD stage III, HTN, HLD with statin intolerance, possible COPD with prior tobacco use quitting greater than 20 years ago, morbid obesity, and OSA not on CPAP  who is being seen today for the evaluation of symptomatic bradycardia.  Assessment & Plan    1. Symptomatic bradycardia: -Likely in the setting of the patient self resuming metoprolol after this was previously discontinued at [facility] due to symptomatic bradycardia and 2-1 AV block -Last dose of Lopressor 25 mg on 9/25 around 10 AM -In ED, he has received 2 doses of atropine with improvement in heart rates to the mid 50s bpm, he later received glucagon last evening -With stable heart rates in the 50s bpm and stable BP, dopamine has not been needed to date -Continue to allow metoprolol washout -If he remains bradycardic tomorrow morning following 36 hour washout of Lopressor, recommend EP evaluate for need of PPM -Pacer pads in place -Atropine at bedside -Continue to avoid beta-blockers and nondihydropyridine calcium channel blocker   2.  CAD status post CABG status post subsequent multiple PCI: -Never with chest pain -High-sensitivity troponin negative x1 -Chronic stable dyspnea, will discuss with interventional cardiology regarding the possibility of transitioning from Brilinta to alternative antiplatelet  -Continue DAPT with ASA and Brilinta without interruption for now -Beta-blocker previously held during last admission given symptomatic bradycardia and 2-1 AV block, recommend to continue to hold beta-blocker -Continue statin   3.  HFpEF: -He does not appear grossly volume up -BNP only 142 -Hold torsemide as outlined below   4.  HTN: -Blood pressure stable -If he becomes hypertensive, could use hydralazine    5.  HLD: -LDL 47 -Historically, he has been  intolerant to statins and was recently initiated on bempedoic acid -Outpatient follow-up   6.  Hyperkalemia: -Improved   7.  Acute on CKD stage III: -Hold torsemide -Gentle IV hydration  -Avoid nephrotoxic agents -Renal artery Duplex as outpatient   For questions or updates, please contact [facility] Please consult [email or web address] for contact info under Cardiology/STEMI.    Signed, Christell Faith, PA-C [facility] Pager: [phone]  [phone] 12/09/2020, 8:03 AM

## 2020-12-09 NOTE — Progress Notes (Addendum)
[place] [facility] [place] NAME: Tony Williams    MR#:  [id number]  PCP: Virginia Crews, MD  DATE OF BIRTH:  1945/08/03  SUBJECTIVE:  CHIEF COMPLAINT:   Chief Complaint  Patient presents with   Bradycardia    Per Pt, I was on my way to cardiac rehab and I felt dizzy and like I was going to pass out.   Feels tired but no other symptoms.  Wife at bedside REVIEW OF SYSTEMS:  Review of Systems  Constitutional:  Positive for malaise/fatigue. Negative for diaphoresis, fever and weight loss.  HENT:  Negative for ear discharge, ear pain, hearing loss, nosebleeds, sore throat and tinnitus.   Eyes:  Negative for blurred vision and pain.  Respiratory:  Negative for cough, hemoptysis, shortness of breath and wheezing.   Cardiovascular:  Negative for chest pain, palpitations, orthopnea and leg swelling.  Gastrointestinal:  Negative for abdominal pain, blood in stool, constipation, diarrhea, heartburn, nausea and vomiting.  Genitourinary:  Negative for dysuria, frequency and urgency.  Musculoskeletal:  Negative for back pain and myalgias.  Skin:  Negative for itching and rash.  Neurological:  Negative for dizziness, tingling, tremors, focal weakness, seizures, weakness and headaches.  Psychiatric/Behavioral:  Negative for depression. The patient is not nervous/anxious.   DRUG ALLERGIES:   Allergies  Allergen Reactions   Butorphanol Itching   Morphine Other (See Comments)    Redness around IV site   Pedi-Pre Tape Spray [Wound Dressing Adhesive] Itching   Simvastatin Other (See Comments)    "Destroyed my muscles"   Statins Other (See Comments)    "Destroyed my muscles"   VITALS:  Blood pressure (!) 144/67, pulse (!) 49, temperature 97.9 F (36.6 C), temperature source Oral, resp. rate 13, height 5\' 8"  (1.727 m), weight 118.8 kg, SpO2 96 %. PHYSICAL EXAMINATION:  Physical Exam 75 year old male lying in the bed comfortably without any acute  distress Eyes pupil equal round reactive to light and accommodation, no scleral icterus, extraocular muscles intact neck supple, no jugular venous distention, no thyroid enlargement or tenderness Lungs clear to auscultation bilaterally no wheezing rales rhonchi crepitation Cardiovascular S1-S2 normal, bradycardia, no murmur rales or gallop Abdomen soft, benign Neuro alert and oriented, nonfocal Psych normal mood and affect Skin no rash or lesion LABORATORY PANEL:  Male CBC Recent Labs  Lab 12/09/20 0520  WBC 10.5  HGB 12.9*  HCT 40.2  PLT 195   ------------------------------------------------------------------------------------------------------------------ Chemistries  Recent Labs  Lab 12/08/20 1525 12/08/20 2156 12/09/20 0520  NA 136   < > 136  K 5.6*   < > 4.4  CL 101   < > 103  CO2 27   < > 25  GLUCOSE 146*   < > 147*  BUN 51*   < > 49*  CREATININE 2.52*   < > 2.47*  CALCIUM 9.4   < > 8.6*  MG 2.4  --   --   AST 14*  --   --   ALT 13  --   --   ALKPHOS 45  --   --   BILITOT 0.7  --   --    < > = values in this interval not displayed.   MEDICATIONS:  Scheduled Meds:  aspirin EC  81 mg Oral Daily   Chlorhexidine Gluconate Cloth  6 each Topical Q0600   famotidine (PEPCID) IV  20 mg Intravenous Q12H   heparin  5,000 Units Subcutaneous Q8H  influenza vaccine adjuvanted  0.5 mL Intramuscular Tomorrow-1000   insulin aspart  0-15 Units Subcutaneous TID WC   insulin aspart  0-5 Units Subcutaneous QHS   mometasone-formoterol  2 puff Inhalation BID   sodium chloride flush  3 mL Intravenous Q12H   sodium zirconium cyclosilicate  10 g Oral Once   ticagrelor  90 mg Oral BID   Continuous Infusions:  sodium chloride 250 mL (12/09/20 1044)   DOPamine     RADIOLOGY:  DG Chest Port 1 View  Result Date: 12/09/2020 CLINICAL DATA:  Acute respiratory failure with hypoxia. EXAM: PORTABLE CHEST 1 VIEW COMPARISON:  12/08/2020 FINDINGS: Cardiac pads overlying the chest.  Stable volume loss in the right hemithorax. Patchy linear densities in the mid and lower right lung are unchanged. Heart size is enlarged with median sternotomy wires. Negative for a pneumothorax. Stable prominent lung markings without overt pulmonary edema. IMPRESSION: 1. Stable chest radiograph findings. 2. Stable volume loss in the right hemithorax with patchy linear densities that are suggestive for scarring or atelectasis. Electronically Signed   By: Markus Daft M.D.   On: 12/09/2020 07:57   ASSESSMENT AND PLAN:  75 year old male with a known history of CAD status post CABG, non-STEMI status post PCI, CKD stage IIIa, essential hypertension, hyperlipidemia, COPD, morbid obesity, OSA not on CPAP is admitted for symptomatic bradycardia.  9/28: Transferred from ICU to [facility] this morning.  Metoprolol wash off till tomorrow and decide need for pacemaker then per cardiology.  Transfer to PCU  Active Problems:   Bradycardia  Symptomatic sinus bradycardia Close monitoring to allow metoprolol washout If he remains bradycardic tomorrow, will need EP eval and possible PPM -Keep pacer pads and atropine at bedside -Notify cardiology if heart rate drops below 40 or patient becomes more symptomatic  Essential hypertension Blood pressure well controlled at this time  CAD s/p CABG and PCI with stenting Continue dual antiplatelet with aspirin and Brilinta for now  AKI with underlying CKD stage IIIa Gentle IV hydration and monitor kidney function, avoid nephrotoxic medications  Chronic diastolic CHF Well compensated at this time  Obesity Body mass index is 39.82 kg/m.  Net IO Since Admission: 130 mL [12/09/20 1540]      LOS: 1 day   Consultants: Cardiology     Status is: Inpatient  Remains inpatient appropriate because:Ongoing diagnostic testing needed not appropriate for outpatient work up  Dispo: The patient is from: Home              Anticipated d/c is to: Home              Patient  currently is not medically stable to d/c.   Difficult to place patient No    DVT prophylaxis:       heparin injection 5,000 Units Start: 12/08/20 1700 SCDs Start: 12/08/20 1646     Family Communication: (Updated wife at bedside)   All the records are reviewed and case discussed with Nursing and TOC team. Management plans discussed with the patient, family and they are in agreement.  CODE STATUS: Full Code Level of care: Progressive Cardiac  TOTAL TIME TAKING CARE OF THIS PATIENT: 25 minutes.   More than 50% of the time was spent in counseling/coordination of care: YES  POSSIBLE D/C IN 2-3 DAYS, DEPENDING ON CLINICAL CONDITION.   Max Sane M.D on 12/09/2020 at 3:40 PM  [facility]   CC: Primary care physician; Virginia Crews, MD  Note: This dictation was prepared with Dragon dictation  along with smaller phrase technology. Any transcriptional errors that result from this process are unintentional.

## 2020-12-09 NOTE — Progress Notes (Addendum)
Patient is A/O x 4, HR has be sustaining in the mid 91s - 63s. Patient ambulated to chair. Patient was steady on his feet but his HR started to decline in the chair. Patient complained of dizziness, returned to bed. Stated he felt better. Pt has been sustaining O2 levels on room air. Retried ambulating to chair this evening,per patient request. Patient tolerated much better. Some decrease in O2 and weakness with exertion. PT orders placed to help patient reach baseline.

## 2020-12-09 NOTE — Telephone Encounter (Signed)
Patient currently admitted

## 2020-12-10 DIAGNOSIS — R001 Bradycardia, unspecified: Secondary | ICD-10-CM | POA: Diagnosis not present

## 2020-12-10 LAB — GLUCOSE, CAPILLARY
Glucose-Capillary: 123 mg/dL — ABNORMAL HIGH (ref 70–99)
Glucose-Capillary: 139 mg/dL — ABNORMAL HIGH (ref 70–99)
Glucose-Capillary: 141 mg/dL — ABNORMAL HIGH (ref 70–99)
Glucose-Capillary: 153 mg/dL — ABNORMAL HIGH (ref 70–99)

## 2020-12-10 MED ORDER — FAMOTIDINE IN NACL 20-0.9 MG/50ML-% IV SOLN
20.0000 mg | Freq: Two times a day (BID) | INTRAVENOUS | Status: DC
  Filled 2020-12-10 (×2): qty 50

## 2020-12-10 MED ORDER — FAMOTIDINE 20 MG PO TABS
20.0000 mg | ORAL_TABLET | Freq: Two times a day (BID) | ORAL | Status: DC
  Administered 2020-12-11: 20 mg via ORAL
  Filled 2020-12-10: qty 1

## 2020-12-10 NOTE — Progress Notes (Signed)
Progress Note  Patient Name: Tony Williams Date of Encounter: 12/10/2020  [facility] Cardiologist: Kate Sable, MD   Subjective   Denies chest pain, dizziness.  Was able to walk earlier around the room without episodes of dizziness, heart rate increased from baseline of 50s to 70 bpm with ambulation.  Daughter and wife at bedside.  Inpatient Medications    Scheduled Meds:  aspirin EC  81 mg Oral Daily   Chlorhexidine Gluconate Cloth  6 each Topical Q0600   famotidine (PEPCID) IV  20 mg Intravenous Q12H   heparin  5,000 Units Subcutaneous Q8H   insulin aspart  0-15 Units Subcutaneous TID WC   insulin aspart  0-5 Units Subcutaneous QHS   mometasone-formoterol  2 puff Inhalation BID   sodium chloride flush  3 mL Intravenous Q12H   sodium zirconium cyclosilicate  10 g Oral Once   ticagrelor  90 mg Oral BID   Continuous Infusions:  sodium chloride Stopped (12/10/20 0324)   DOPamine     PRN Meds: sodium chloride, acetaminophen, docusate sodium, HYDROcodone-acetaminophen, melatonin, ondansetron (ZOFRAN) IV, polyethylene glycol, sodium chloride flush   Vital Signs    Vitals:   12/10/20 0801 12/10/20 1000 12/10/20 1100 12/10/20 1156  BP:  (!) 141/63  (!) 168/60  Pulse: (!) 56 (!) 56 (!) 55 64  Resp: 17 10 20 17   Temp:      TempSrc:      SpO2: 90% 91% 92% (!) 86%  Weight:      Height:        Intake/Output Summary (Last 24 hours) at 12/10/2020 1416 Last data filed at 12/10/2020 1300 Gross per 24 hour  Intake 747.33 ml  Output 1650 ml  Net -902.67 ml   Last 3 Weights 12/08/2020 12/08/2020 11/20/2020  Weight (lbs) 261 lb 14.5 oz 259 lb 263 lb  Weight (kg) 118.8 kg 117.482 kg 119.296 kg      Telemetry    Sinus bradycardia heart rate 56- Personally Reviewed  ECG     - Personally Reviewed  Physical Exam   GEN: No acute distress.   Neck: No JVD Cardiac: RRR, no murmurs, rubs, or gallops.  Respiratory: Clear to auscultation bilaterally. GI: Soft,  nontender, non-distended  MS: No edema; No deformity. Neuro:  Nonfocal  Psych: Normal affect   Labs    High Sensitivity Troponin:   Recent Labs  Lab 11/15/20 1819 11/15/20 2240 11/16/20 0221 12/08/20 1525 12/08/20 1909  TROPONINIHS 625* 366* 353* 11 10     Chemistry Recent Labs  Lab 12/08/20 1525 12/08/20 2156 12/09/20 0520  NA 136 135 136  K 5.6* 5.2* 4.4  CL 101 100 103  CO2 27 26 25   GLUCOSE 146* 208* 147*  BUN 51* 48* 49*  CREATININE 2.52* 2.40* 2.47*  CALCIUM 9.4 8.7* 8.6*  MG 2.4  --   --   PROT 7.3  --   --   ALBUMIN 3.8  --   --   AST 14*  --   --   ALT 13  --   --   ALKPHOS 45  --   --   BILITOT 0.7  --   --   GFRNONAA 26* 28* 27*  ANIONGAP 8 9 8     Lipids No results for input(s): CHOL, TRIG, HDL, LABVLDL, LDLCALC, CHOLHDL in the last 168 hours.  Hematology Recent Labs  Lab 12/08/20 1525 12/09/20 0520  WBC 13.1* 10.5  RBC 4.84 4.31  HGB 14.9 12.9*  HCT 44.4 40.2  MCV 91.7 93.3  MCH 30.8 29.9  MCHC 33.6 32.1  RDW 14.4 14.6  PLT 231 195   Thyroid  Recent Labs  Lab 12/08/20 1525  TSH 2.515  FREET4 0.82    BNP Recent Labs  Lab 12/08/20 1525  BNP 142.7*    DDimer No results for input(s): DDIMER in the last 168 hours.   Radiology    DG Chest Port 1 View  Result Date: 12/09/2020 CLINICAL DATA:  Acute respiratory failure with hypoxia. EXAM: PORTABLE CHEST 1 VIEW COMPARISON:  12/08/2020 FINDINGS: Cardiac pads overlying the chest. Stable volume loss in the right hemithorax. Patchy linear densities in the mid and lower right lung are unchanged. Heart size is enlarged with median sternotomy wires. Negative for a pneumothorax. Stable prominent lung markings without overt pulmonary edema. IMPRESSION: 1. Stable chest radiograph findings. 2. Stable volume loss in the right hemithorax with patchy linear densities that are suggestive for scarring or atelectasis. Electronically Signed   By: Markus Daft M.D.   On: 12/09/2020 07:57   DG Chest Portable  1 View  Result Date: 12/08/2020 CLINICAL DATA:  Short of breath, dizziness, bradycardia EXAM: PORTABLE CHEST 1 VIEW COMPARISON:  11/15/2020 FINDINGS: Single frontal view of the chest demonstrates external defibrillator pads overlying the left chest. Stable median sternotomy wires. Cardiac silhouette is unremarkable. Lung volumes are diminished, with increased central vascular congestion. Chronic right pleural thickening or trace pleural fluid unchanged. No pneumothorax. IMPRESSION: 1. Low lung volumes with increased central vascular congestion. No overt edema. Electronically Signed   By: Randa Ngo M.D.   On: 12/08/2020 15:58    Cardiac Studies   Limited echo 11/2020 EF 60 to 65%, indeterminate diastolic function  Patient Profile     75 y.o. male with a hx of CAD/CABG x 4 2007, PCI x 2 2018, PCI x 1 SVG-LAD/Diagonal 2022, former smoker, possible COPD, OSA, obesity diabetes who presents due to hypotension and bradycardia in the setting of overmedication with amlodipine and metoprolol.  Assessment & Plan    Symptomatic bradycardia -Heart rates improved to 50s with holding metoprolol -Continue to hold -EP consulted regarding possible SSS.  Pacer seems not likely with improvement in heart rates, but we will wait for full EP evaluation. -Continue to avoid AV nodal agents -If no pacer planned, encourage ambulation while patient in-house.   2.  History of hypertension, BP low upon admission -BP improved with holding PTA lisinopril and Norvasc. -Continue to hold  3. CAD/CABG, PCI -Statin intolerance -Aspirin, Brilinta, likely as an lovastatin upon discharge.  Total encounter time 35 minutes  Greater than 50% was spent in counseling and coordination of care with the patient     Signed, Kate Sable, MD  12/10/2020, 2:16 PM

## 2020-12-10 NOTE — Consult Note (Signed)
Electrophysiology Consultation:   Patient ID: Tony Williams MRN: [id number]; DOB: 12/21/1945  Admit date: 12/08/2020 Date of Consult: 12/10/2020  PCP:  Virginia Crews, MD   [place] Providers Cardiologist:  Kate Sable, MD    Patient Profile:   Tony Williams is a 75 y.o. male with a hx of severe CAD who is being seen 12/10/2020 for the evaluation of bradycardia at the request of Dr Garen Lah.  History of Present Illness:   Tony Williams recentlyhad a high risk PCI to a vein graft to a diagonal and LAD and was admitted with lightheadedness and hypotension. At the time of admission he was found to be significantly bradycardic. Upon further questioning, the patient tells me that he has been lightheaded for a few days. Symptoms seem to correspond to when he was taking his beta blocker. Since being admitted, patient's symptoms have improved. His HR at the time of my evaluation are in the 60s at rest and go up to the 70s and 80s during conversation.   Past Medical History:  Diagnosis Date   CAD S/P percutaneous coronary angioplasty    CAD, multiple vessel    Cancer ([place]) 2013   Bladder   Chronic kidney disease    COPD (chronic obstructive pulmonary disease) (HCC)    Depression    Diabetes mellitus without complication (HCC)    Heart problem    Hx of CABG x 4    LIMA-D2, SVG-OM (occluded), SeqSVG-D1-LAD.   Myocardial infarction [facility])     Past Surgical History:  Procedure Laterality Date   CHOLECYSTECTOMY, LAPAROSCOPIC  12/26/1995   COLONOSCOPY WITH PROPOFOL N/A 09/23/2020   Procedure: COLONOSCOPY WITH PROPOFOL;  Surgeon: Lucilla Lame, MD;  Location: [facility] ENDOSCOPY;  Service: Endoscopy;  Laterality: N/A;   CORONARY ARTERY BYPASS GRAFT     LIMA-D2, SVG-OM, SeqSVG-D1-LAD   CORONARY STENT INTERVENTION N/A 11/12/2020   Procedure: CORONARY STENT INTERVENTION;  Surgeon: Wellington Hampshire, MD;  Location: [place] CV LAB;  Service: Cardiovascular;  Laterality:  N/A;   EYE SURGERY     IABP INSERTION N/A 11/11/2020   Procedure: IABP Insertion;  Surgeon: Nelva Bush, MD;  Location: [place] CV LAB;  Service: Cardiovascular;  Laterality: N/A;   IR STENT PLACEMENT ANTE CAROTID INC ANGIO     KNEE SURGERY     RIGHT/LEFT HEART CATH AND CORONARY/GRAFT ANGIOGRAPHY N/A 11/11/2020   Procedure: RIGHT/LEFT HEART CATH AND CORONARY/GRAFT ANGIOGRAPHY;  Surgeon: Nelva Bush, MD;  Location: [place] CV LAB;  Service: Cardiovascular;  Severe native CAD: 90% dLMCA-100% mLAD/100% ost LCx CTO w/ Severe diffuse RCA disease. Widely patent LIMA-D1. Patent sequential SVG-D2-LAD with 99% mid graft & 90% LIMA-D2 @ anastomosis. previously stented SVG-OM - CTO. Mildly   TRANSURETHRAL RESECTION OF PROSTATE  1994   URINARY SPHINCTER IMPLANT  1995       Inpatient Medications: Scheduled Meds:  aspirin EC  81 mg Oral Daily   Chlorhexidine Gluconate Cloth  6 each Topical Q0600   famotidine (PEPCID) IV  20 mg Intravenous Q12H   heparin  5,000 Units Subcutaneous Q8H   insulin aspart  0-15 Units Subcutaneous TID WC   insulin aspart  0-5 Units Subcutaneous QHS   mometasone-formoterol  2 puff Inhalation BID   sodium chloride flush  3 mL Intravenous Q12H   sodium zirconium cyclosilicate  10 g Oral Once   ticagrelor  90 mg Oral BID   Continuous Infusions:  sodium chloride Stopped (12/10/20 0324)   DOPamine     PRN  Meds: sodium chloride, acetaminophen, docusate sodium, HYDROcodone-acetaminophen, melatonin, ondansetron (ZOFRAN) IV, polyethylene glycol, sodium chloride flush  Allergies:    Allergies  Allergen Reactions   Butorphanol Itching   Morphine Other (See Comments)    Redness around IV site   Pedi-Pre Tape Spray [Wound Dressing Adhesive] Itching   Simvastatin Other (See Comments)    "Destroyed my muscles"   Statins Other (See Comments)    "Destroyed my muscles"    Social History:   Social History   Socioeconomic History   Marital status:  Married    Spouse name: Not on file   Number of children: 2   Years of education: Not on file   Highest education level: High school graduate  Occupational History   Occupation: retired  Tobacco Use   Smoking status: Former    Types: Cigarettes    Quit date: 10/23/2003    Years since quitting: 17.1   Smokeless tobacco: Never  Vaping Use   Vaping Use: Never used  Substance and Sexual Activity   Alcohol use: Never   Drug use: Never   Sexual activity: Not on file  Other Topics Concern   Not on file  Social History Narrative   Not on file   Social Determinants of Health   Financial Resource Strain: Low Risk    Difficulty of Paying Living Expenses: Not hard at all  Food Insecurity: No Food Insecurity   Worried About Charity fundraiser in the Last Year: Never true   [place] in the Last Year: Never true  Transportation Needs: No Transportation Needs   Lack of Transportation (Medical): No   Lack of Transportation (Non-Medical): No  Physical Activity: Inactive   Days of Exercise per Week: 0 days   Minutes of Exercise per Session: 0 min  Stress: No Stress Concern Present   Feeling of Stress : Not at all  Social Connections: Moderately Isolated   Frequency of Communication with Friends and Family: More than three times a week   Frequency of Social Gatherings with Friends and Family: Three times a week   Attends Religious Services: Never   Active Member of Clubs or Organizations: No   Attends Archivist Meetings: Never   Marital Status: Married  Human resources officer Violence: Not At Risk   Fear of Current or Ex-Partner: No   Emotionally Abused: No   Physically Abused: No   Sexually Abused: No    Family History:    Family History  Problem Relation Age of Onset   Heart Problems Mother    Heart Problems Father      ROS:  Please see the history of present illness.   All other ROS reviewed and negative.     Physical Exam/Data:   Vitals:   12/10/20 0801  12/10/20 1000 12/10/20 1100 12/10/20 1156  BP:  (!) 141/63  (!) 168/60  Pulse: (!) 56 (!) 56 (!) 55 64  Resp: 17 10 20 17   Temp:      TempSrc:      SpO2: 90% 91% 92% (!) 86%  Weight:      Height:        Intake/Output Summary (Last 24 hours) at 12/10/2020 1211 Last data filed at 12/10/2020 1100 Gross per 24 hour  Intake 677.08 ml  Output 1850 ml  Net -1172.92 ml   Last 3 Weights 12/08/2020 12/08/2020 11/20/2020  Weight (lbs) 261 lb 14.5 oz 259 lb 263 lb  Weight (kg) 118.8 kg 117.482 kg 119.296  kg     Body mass index is 39.82 kg/m.  General:  Well nourished, well developed, in no acute distress. Obese. HEENT: normal Neck: no JVD Vascular: No carotid bruits; Distal pulses 2+ bilaterally Cardiac:  normal S1, S2; RRR; no murmur  Lungs:  clear to auscultation bilaterally, no wheezing, rhonchi or rales  Abd: soft, nontender, no hepatomegaly  Ext: no edema Musculoskeletal:  No deformities, BUE and BLE strength normal and equal Skin: warm and dry  Neuro:  CNs 2-12 intact, no focal abnormalities noted Psych:  Normal affect   EKG:  The EKG was personally reviewed and demonstrates:  sinus rhythm. Intervals normal.   Telemetry:  Telemetry was personally reviewed and demonstrates:  sinus rhythm. Bradycardia earlier in the hospitalization. Now rates in the 60s-80s.  Relevant CV Studies:  11/16/2020 echo LVEF60%   Laboratory Data:  High Sensitivity Troponin:   Recent Labs  Lab 11/15/20 1819 11/15/20 2240 11/16/20 0221 12/08/20 1525 12/08/20 1909  TROPONINIHS 625* 366* 353* 11 10     Chemistry Recent Labs  Lab 12/08/20 1525 12/08/20 2156 12/09/20 0520  NA 136 135 136  K 5.6* 5.2* 4.4  CL 101 100 103  CO2 27 26 25   GLUCOSE 146* 208* 147*  BUN 51* 48* 49*  CREATININE 2.52* 2.40* 2.47*  CALCIUM 9.4 8.7* 8.6*  MG 2.4  --   --   GFRNONAA 26* 28* 27*  ANIONGAP 8 9 8     Recent Labs  Lab 12/08/20 1525  PROT 7.3  ALBUMIN 3.8  AST 14*  ALT 13  ALKPHOS 45  BILITOT 0.7    Lipids No results for input(s): CHOL, TRIG, HDL, LABVLDL, LDLCALC, CHOLHDL in the last 168 hours.  Hematology Recent Labs  Lab 12/08/20 1525 12/09/20 0520  WBC 13.1* 10.5  RBC 4.84 4.31  HGB 14.9 12.9*  HCT 44.4 40.2  MCV 91.7 93.3  MCH 30.8 29.9  MCHC 33.6 32.1  RDW 14.4 14.6  PLT 231 195   Thyroid  Recent Labs  Lab 12/08/20 1525  TSH 2.515  FREET4 0.82    BNP Recent Labs  Lab 12/08/20 1525  BNP 142.7*    DDimer No results for input(s): DDIMER in the last 168 hours.   Radiology/Studies:  [facility] Chest Port 1 View  Result Date: 12/09/2020 CLINICAL DATA:  Acute respiratory failure with hypoxia. EXAM: PORTABLE CHEST 1 VIEW COMPARISON:  12/08/2020 FINDINGS: Cardiac pads overlying the chest. Stable volume loss in the right hemithorax. Patchy linear densities in the mid and lower right lung are unchanged. Heart size is enlarged with median sternotomy wires. Negative for a pneumothorax. Stable prominent lung markings without overt pulmonary edema. IMPRESSION: 1. Stable chest radiograph findings. 2. Stable volume loss in the right hemithorax with patchy linear densities that are suggestive for scarring or atelectasis. Electronically Signed   By: Markus Daft M.D.   On: 12/09/2020 07:57   DG Chest Portable 1 View  Result Date: 12/08/2020 CLINICAL DATA:  Short of breath, dizziness, bradycardia EXAM: PORTABLE CHEST 1 VIEW COMPARISON:  11/15/2020 FINDINGS: Single frontal view of the chest demonstrates external defibrillator pads overlying the left chest. Stable median sternotomy wires. Cardiac silhouette is unremarkable. Lung volumes are diminished, with increased central vascular congestion. Chronic right pleural thickening or trace pleural fluid unchanged. No pneumothorax. IMPRESSION: 1. Low lung volumes with increased central vascular congestion. No overt edema. Electronically Signed   By: Randa Ngo M.D.   On: 12/08/2020 15:58     Assessment and Plan:  Sinus bradycardia In the  setting of beta blocker use. Heart rates have trended up since beta blockers have washed out. No evidence of AV nodal disease. Recommend no nodal blockers for Mr Sciarra. If he develops a condition that requires beta blockade in the future, he will likely require pacemaker. This was discussed with the patient and his family. For now, no pacemaker is indicated.  OK to discharge from an EP perspective.  For questions or updates, please contact [place] Please consult [email or web address] for contact info under    Signed, Vickie Epley, MD  12/10/2020 12:11 PM

## 2020-12-10 NOTE — Progress Notes (Signed)
0800 patient alert able to make all needs known 1030 patient able to stand and walk in room with no complications HR increased from 50s to high 70s low 80s pt tolerated well family at bedside. 1130 attending and cardiology seen patient and family at bedside updated on plan of care

## 2020-12-10 NOTE — Progress Notes (Addendum)
1       [facility] at [place] NAME: Tony Williams    MR#:  [id number]  PCP: Virginia Crews, MD  DATE OF BIRTH:  April 06, 1945  SUBJECTIVE:  CHIEF COMPLAINT:   Chief Complaint  Patient presents with  . Bradycardia    Per Pt, I was on my way to cardiac rehab and I felt dizzy and like I was going to pass out.   Remains stable.  Heart rate from 40s to 60s at rest REVIEW OF SYSTEMS:  Review of Systems  Constitutional:  Positive for malaise/fatigue. Negative for diaphoresis, fever and weight loss.  HENT:  Negative for ear discharge, ear pain, hearing loss, nosebleeds, sore throat and tinnitus.   Eyes:  Negative for blurred vision and pain.  Respiratory:  Negative for cough, hemoptysis, shortness of breath and wheezing.   Cardiovascular:  Negative for chest pain, palpitations, orthopnea and leg swelling.  Gastrointestinal:  Negative for abdominal pain, blood in stool, constipation, diarrhea, heartburn, nausea and vomiting.  Genitourinary:  Negative for dysuria, frequency and urgency.  Musculoskeletal:  Negative for back pain and myalgias.  Skin:  Negative for itching and rash.  Neurological:  Negative for dizziness, tingling, tremors, focal weakness, seizures, weakness and headaches.  Psychiatric/Behavioral:  Negative for depression. The patient is not nervous/anxious.   DRUG ALLERGIES:   Allergies  Allergen Reactions  . Butorphanol Itching  . Morphine Other (See Comments)    Redness around IV site  . Pedi-Pre Tape Spray [Wound Dressing Adhesive] Itching  . Simvastatin Other (See Comments)    "Destroyed my muscles"  . Statins Other (See Comments)    "Destroyed my muscles"   VITALS:  Blood pressure (!) 152/68, pulse 62, temperature 98.2 F (36.8 C), temperature source Oral, resp. rate 20, height 5\' 8"  (1.727 m), weight 118.8 kg, SpO2 91 %. PHYSICAL EXAMINATION:  Physical Exam 75 year old male lying in the bed comfortably without any acute  distress Eyes pupil equal round reactive to light and accommodation, no scleral icterus, extraocular muscles intact neck supple, no jugular venous distention, no thyroid enlargement or tenderness Lungs clear to auscultation bilaterally no wheezing rales rhonchi crepitation Cardiovascular S1-S2 normal, bradycardia, no murmur rales or gallop Abdomen soft, benign Neuro alert and oriented, nonfocal Psych normal mood and affect Skin no rash or lesion LABORATORY PANEL:  Male CBC Recent Labs  Lab 12/09/20 0520  WBC 10.5  HGB 12.9*  HCT 40.2  PLT 195   ------------------------------------------------------------------------------------------------------------------ Chemistries  Recent Labs  Lab 12/08/20 1525 12/08/20 2156 12/09/20 0520  NA 136   < > 136  K 5.6*   < > 4.4  CL 101   < > 103  CO2 27   < > 25  GLUCOSE 146*   < > 147*  BUN 51*   < > 49*  CREATININE 2.52*   < > 2.47*  CALCIUM 9.4   < > 8.6*  MG 2.4  --   --   AST 14*  --   --   ALT 13  --   --   ALKPHOS 45  --   --   BILITOT 0.7  --   --    < > = values in this interval not displayed.   MEDICATIONS:  Scheduled Meds: . aspirin EC  81 mg Oral Daily  . Chlorhexidine Gluconate Cloth  6 each Topical Q0600  . famotidine (PEPCID) IV  20 mg Intravenous Q12H  . heparin  5,000 Units Subcutaneous Q8H  .  insulin aspart  0-15 Units Subcutaneous TID WC  . insulin aspart  0-5 Units Subcutaneous QHS  . mometasone-formoterol  2 puff Inhalation BID  . sodium chloride flush  3 mL Intravenous Q12H  . sodium zirconium cyclosilicate  10 g Oral Once  . ticagrelor  90 mg Oral BID   Continuous Infusions: . sodium chloride Stopped (12/10/20 0324)  . DOPamine     RADIOLOGY:  No results found. ASSESSMENT AND PLAN:  75 year old male with a known history of CAD status post CABG, non-STEMI status post PCI, CKD stage IIIa, essential hypertension, hyperlipidemia, COPD, morbid obesity, OSA not on CPAP is admitted for symptomatic  bradycardia.  9/28: Transferred [facility] ICU to [facility]. Metoprolol wash off till tomorrow and decide need for pacemaker then per cardiology. 9/29: Pending EP eval  Active Problems:   Symptomatic sinus bradycardia  Symptomatic sinus bradycardia Heart rate ranging in 40s to 60s.  -EP eval today  Acute hypoxic resp failure - POA Due to atelectesis, pulmo edema and underlying OSA Needing 2 liters O2 via [place] - now resolved and is on RA  Essential hypertension Blood pressure well controlled at this time  CAD s/p CABG and PCI with stenting Continue dual antiplatelet with aspirin and Brilinta for now  AKI with underlying CKD stage IIIa Gentle IV hydration and monitor kidney function, avoid nephrotoxic medications  Chronic diastolic CHF Well compensated at this time  Obesity Body mass index is 39.82 kg/m.  Net IO Since Admission: -850.4 mL [12/10/20 1655]      LOS: 2 days   Consultants: Cardiology     Status is: Inpatient  Remains inpatient appropriate because:Ongoing diagnostic testing needed not appropriate for outpatient work up  Dispo: The patient is from: Home              Anticipated d/c is to: Home              Patient currently is not medically stable to d/c.   Difficult to place patient No    DVT prophylaxis:       heparin injection 5,000 Units Start: 12/08/20 1700 SCDs Start: 12/08/20 1646     Family Communication: (Updated wife at bedside)   All the records are reviewed and case discussed with Nursing and TOC team. Management plans discussed with the patient, family and they are in agreement.  CODE STATUS: Full Code Level of care: Med-Surg  TOTAL TIME TAKING CARE OF THIS PATIENT: 25 minutes.   More than 50% of the time was spent in counseling/coordination of care: YES  POSSIBLE D/C IN 1-2 DAYS, DEPENDING ON CLINICAL CONDITION.   Max Sane M.D on 12/10/2020 at 4:55 PM  [facility]   CC: Primary care physician; Virginia Crews,  MD  Note: This dictation was prepared with Dragon dictation along with smaller phrase technology. Any transcriptional errors that result from this process are unintentional.

## 2020-12-10 NOTE — Plan of Care (Signed)
  Problem: Education: Goal: Knowledge of General Education information will improve Description: Including pain rating scale, medication(s)/side effects and non-pharmacologic comfort measures Outcome: Progressing   Problem: Health Behavior/Discharge Planning: Goal: Ability to manage health-related needs will improve Outcome: Progressing   Problem: Clinical Measurements: Goal: Ability to maintain clinical measurements within normal limits will improve Outcome: Progressing Goal: Will remain free from infection Outcome: Progressing Goal: Diagnostic test results will improve Outcome: Progressing Goal: Respiratory complications will improve Outcome: Progressing Goal: Cardiovascular complication will be avoided Description: Patient awake and alert, denies SOB or pain. On CM brady cardiac in 47-56. Outcome: Progressing   Problem: Activity: Goal: Risk for activity intolerance will decrease Outcome: Progressing   Problem: Nutrition: Goal: Adequate nutrition will be maintained Outcome: Progressing   Problem: Coping: Goal: Level of anxiety will decrease Outcome: Progressing   Problem: Elimination: Goal: Will not experience complications related to bowel motility Outcome: Progressing Goal: Will not experience complications related to urinary retention Outcome: Progressing   Problem: Pain Managment: Goal: General experience of comfort will improve Outcome: Progressing   Problem: Safety: Goal: Ability to remain free from injury will improve Outcome: Progressing   Problem: Skin Integrity: Goal: Risk for impaired skin integrity will decrease Outcome: Progressing Patient alert able to make all needs known call light in reach, pt uses urinal family at bedside no reported pain at this time

## 2020-12-10 NOTE — Progress Notes (Addendum)
PT Cancellation Note  Patient Details Name: Tony Williams MRN: [id number] DOB: [birth year]   Cancelled Treatment:    Reason Eval/Treat Not Completed: Other (comment) Spoke with attending MD who asks [place] to hold PT today (may need pacemaker, TBD).  He has had sustained decreased HR, but apparently has been able to stand/move out of bed a few times with CCU staff.  Kreg Shropshire, DPT 12/10/2020, 1:09 PM

## 2020-12-11 DIAGNOSIS — I959 Hypotension, unspecified: Secondary | ICD-10-CM

## 2020-12-11 DIAGNOSIS — E875 Hyperkalemia: Secondary | ICD-10-CM

## 2020-12-11 DIAGNOSIS — E782 Mixed hyperlipidemia: Secondary | ICD-10-CM

## 2020-12-11 DIAGNOSIS — I25118 Atherosclerotic heart disease of native coronary artery with other forms of angina pectoris: Secondary | ICD-10-CM

## 2020-12-11 DIAGNOSIS — J9601 Acute respiratory failure with hypoxia: Secondary | ICD-10-CM

## 2020-12-11 DIAGNOSIS — F5104 Psychophysiologic insomnia: Secondary | ICD-10-CM

## 2020-12-11 LAB — GLUCOSE, CAPILLARY: Glucose-Capillary: 141 mg/dL — ABNORMAL HIGH (ref 70–99)

## 2020-12-11 MED ORDER — INSULIN ASPART 100 UNIT/ML IJ SOLN
0.0000 [IU] | Freq: Three times a day (TID) | INTRAMUSCULAR | Status: DC
  Administered 2020-12-11: 2 [IU] via SUBCUTANEOUS
  Filled 2020-12-11: qty 1

## 2020-12-11 MED ORDER — DIPHENHYDRAMINE HCL 50 MG/ML IJ SOLN
25.0000 mg | Freq: Once | INTRAMUSCULAR | Status: AC
  Administered 2020-12-11: 25 mg via INTRAVENOUS
  Filled 2020-12-11: qty 1

## 2020-12-11 NOTE — Plan of Care (Signed)
  Problem: Education: Goal: Knowledge of General Education information will improve Description: Including pain rating scale, medication(s)/side effects and non-pharmacologic comfort measures Outcome: Progressing   Problem: Health Behavior/Discharge Planning: Goal: Ability to manage health-related needs will improve Outcome: Progressing   Problem: Clinical Measurements: Goal: Ability to maintain clinical measurements within normal limits will improve Outcome: Progressing Goal: Will remain free from infection Outcome: Progressing Goal: Diagnostic test results will improve Outcome: Progressing Goal: Respiratory complications will improve Outcome: Progressing Goal: Cardiovascular complication will be avoided Description: Patient awake and alert, denies SOB or pain. On CM brady cardiac in 47-56. Outcome: Progressing   Problem: Activity: Goal: Risk for activity intolerance will decrease Outcome: Progressing   Problem: Nutrition: Goal: Adequate nutrition will be maintained Outcome: Progressing   Problem: Coping: Goal: Level of anxiety will decrease Outcome: Progressing   Problem: Elimination: Goal: Will not experience complications related to bowel motility Outcome: Progressing Goal: Will not experience complications related to urinary retention Outcome: Progressing   Problem: Pain Managment: Goal: General experience of comfort will improve Outcome: Progressing   Problem: Safety: Goal: Ability to remain free from injury will improve Outcome: Progressing   Problem: Skin Integrity: Goal: Risk for impaired skin integrity will decrease Outcome: Progressing

## 2020-12-11 NOTE — Progress Notes (Signed)
Progress Note  Patient Name: Tony Williams Date of Encounter: 12/11/2020  [facility] Cardiologist: Kate Sable, MD   Subjective   On rounds this morning, reports he is feeling good, denies any lightheadedness, shortness of breath or chest pain, feels ready to go home Family at the bedside Discussed that metoprolol has been held this admission with improvement of his heart rate At rest will run into the high 40s, with any exertion up into the 60s On amlodipine for blood pressure at home  Inpatient Medications    Scheduled Meds:  aspirin EC  81 mg Oral Daily   Chlorhexidine Gluconate Cloth  6 each Topical Q0600   famotidine  20 mg Oral BID   heparin  5,000 Units Subcutaneous Q8H   insulin aspart  0-15 Units Subcutaneous TID AC & HS   mometasone-formoterol  2 puff Inhalation BID   sodium chloride flush  3 mL Intravenous Q12H   sodium zirconium cyclosilicate  10 g Oral Once   ticagrelor  90 mg Oral BID   Continuous Infusions:  sodium chloride Stopped (12/10/20 0324)   PRN Meds: sodium chloride, acetaminophen, docusate sodium, HYDROcodone-acetaminophen, melatonin, ondansetron (ZOFRAN) IV, polyethylene glycol, sodium chloride flush   Vital Signs    Vitals:   12/10/20 1734 12/10/20 1953 12/11/20 0442 12/11/20 0817  BP: (!) 179/51 (!) 143/42 (!) 178/49 (!) 159/59  Pulse: (!) 56 63 (!) 51 (!) 54  Resp: 16 18  18   Temp: 98.2 F (36.8 C) 98.3 F (36.8 C) 97.8 F (36.6 C) 97.8 F (36.6 C)  TempSrc: Oral  Oral Oral  SpO2: 92% 91% (!) 64% 95%  Weight:      Height:        Intake/Output Summary (Last 24 hours) at 12/11/2020 1715 Last data filed at 12/11/2020 0900 Gross per 24 hour  Intake 720 ml  Output --  Net 720 ml   Last 3 Weights 12/08/2020 12/08/2020 11/20/2020  Weight (lbs) 261 lb 14.5 oz 259 lb 263 lb  Weight (kg) 118.8 kg 117.482 kg 119.296 kg      Telemetry    Sinus bradycardia- Personally Reviewed  ECG     Personally Reviewed  Physical Exam    GEN: No acute distress.   Neck: No JVD Cardiac: RRR, no murmurs, rubs, or gallops.  Respiratory: Clear to auscultation bilaterally. GI: Soft, nontender, non-distended  MS: No edema; No deformity. Neuro:  Nonfocal  Psych: Normal affect   Labs    High Sensitivity Troponin:   Recent Labs  Lab 11/15/20 1819 11/15/20 2240 11/16/20 0221 12/08/20 1525 12/08/20 1909  TROPONINIHS 625* 366* 353* 11 10     Chemistry Recent Labs  Lab 12/08/20 1525 12/08/20 2156 12/09/20 0520  NA 136 135 136  K 5.6* 5.2* 4.4  CL 101 100 103  CO2 27 26 25   GLUCOSE 146* 208* 147*  BUN 51* 48* 49*  CREATININE 2.52* 2.40* 2.47*  CALCIUM 9.4 8.7* 8.6*  MG 2.4  --   --   PROT 7.3  --   --   ALBUMIN 3.8  --   --   AST 14*  --   --   ALT 13  --   --   ALKPHOS 45  --   --   BILITOT 0.7  --   --   GFRNONAA 26* 28* 27*  ANIONGAP 8 9 8     Lipids No results for input(s): CHOL, TRIG, HDL, LABVLDL, LDLCALC, CHOLHDL in the last 168 hours.  Hematology Recent  Labs  Lab 12/08/20 1525 12/09/20 0520  WBC 13.1* 10.5  RBC 4.84 4.31  HGB 14.9 12.9*  HCT 44.4 40.2  MCV 91.7 93.3  MCH 30.8 29.9  MCHC 33.6 32.1  RDW 14.4 14.6  PLT 231 195   Thyroid  Recent Labs  Lab 12/08/20 1525  TSH 2.515  FREET4 0.82    BNP Recent Labs  Lab 12/08/20 1525  BNP 142.7*    DDimer No results for input(s): DDIMER in the last 168 hours.   Radiology    No results found.  Cardiac Studies     Patient Profile     75 y.o. male with a hx of CAD/CABG x 4 2007, PCI x 2 2018, PCI x 1 SVG-LAD/Diagonal 2022, former smoker, possible COPD, OSA, obesity diabetes who presents due to hypotension and bradycardia in the setting of overmedication with amlodipine and metoprolol.  Assessment & Plan    Symptomatic bradycardia Metoprolol held this admission, improved heart rate and symptoms Has been evaluated by EP, no indication for pacemaker Recommend he avoid all beta-blockers, and calcium channel blocker such as  diltiazem, verapamil Recommend he call us for recurrence of his symptoms  Essential hypertension Blood pressure now running high with lisinopril and Norvasc held Recommend we restart his lisinopril Norvasc, continue to hold metoprolol Last several systolic pressures close to 160 on no medications at rest  Coronary artery disease with stable angina CABG, prior PCI On aspirin Brilinta Consider PCSK9 inhibitor in outpatient follow-up  Discharge instructions provided, long discussion concerning medications that can slow heart rate, discussed blood pressure management in detail  Total encounter time more than 35 minutes  Greater than 50% was spent in counseling and coordination of care with the patient  For questions or updates, please contact [place] Please consult [email or web address] for contact info under        Signed, Ida Rogue, MD  12/11/2020, 5:15 PM

## 2020-12-11 NOTE — TOC Transition Note (Signed)
Transition of Care [facility]) - CM/SW Discharge Note   Patient Details  Name: Tony Williams MRN: [id number] Date of Birth: Sep 23, 1945  [facility]) CM/SW Contact:  Candie Chroman, LCSW Phone Number: 12/11/2020, 11:07 AM   Clinical Narrative:  Patient has orders to discharge home today. Readmission prevention screen complete. CSW met with patient. Wife at bedside. CSW introduced role and explained that discharge planning would be discussed. PCP is [name], MD at [facility]. Patient drives to his appointments. Pharmacy is [facility] on [address] of [address]. No issues obtaining medications. No home health prior to admission. He is starting cardiac rehab at [facility]. Uses a single-point cane. No further concerns. CSW signing off.   Final next level of care: Home/Self Care Barriers to Discharge: No Barriers Identified   Patient Goals and CMS Choice        Discharge Placement                Patient to be transferred to facility by: Wife will take him home. Name of family member notified: [name] Patient and family notified of of transfer: 12/11/20  Discharge Plan and Services                                     Social Determinants of Health (SDOH) Interventions     Readmission Risk Interventions Readmission Risk Prevention Plan 12/11/2020  Transportation Screening Complete  PCP or Specialist Appt within 5-7 Days Complete  Medication Review (RN CM) Complete

## 2020-12-11 NOTE — Care Management Important Message (Signed)
Important Message  Patient Details  Name: Tony Williams MRN: [id number] Date of Birth: May 22, 1945   Medicare Important Message Given:  No  Patient discharged prior to arrival to unit to deliver concurrent Medicare IM.     Dannette Barbara 12/11/2020, 2:51 PM

## 2020-12-11 NOTE — Discharge Summary (Signed)
Physician Discharge Summary   Patient name: Tony Williams  Admit date:     12/08/2020  Discharge date: 12/11/2020  Attending Physician: Flora Lipps [[id number]]  Discharge Physician: Max Sane   PCP: Virginia Crews, MD    Follow-up Information     Virginia Crews, MD. Schedule an appointment as soon as possible for a visit in 3 day(s).   Specialty: Family Medicine Why: [facility] Discharge F/UP Contact information: [address] [place] [address] [phone]         Kate Sable, MD. Schedule an appointment as soon as possible for a visit in 2 week(s).   Specialties: Cardiology, Radiology Why: [facility] Discharge F/UP Contact information: [place] [address] [phone]                 Recommendations at discharge: f/up as above  Discharge Diagnoses Active Problems:   Symptomatic sinus bradycardia   Hypotension   Hyperkalemia  Hospital Course    75 year old male with a known history of CAD status post CABG, non-STEMI status post PCI, CKD stage IIIa, essential hypertension, hyperlipidemia, COPD, morbid obesity, OSA not on CPAP is admitted for symptomatic bradycardia.   Symptomatic sinus bradycardia Avoid all b-blockers and CCB. No pacemaker need per EP eval. Symptoms resolved off metoprolol   Acute hypoxic resp failure - POA Due to atelectesis, pulmo edema and underlying OSA Resolved at D/C. On RA   Essential hypertension Blood pressure well controlled    CAD s/p CABG and PCI with stenting Continue dual antiplatelet with aspirin and Brilinta    AKI with underlying CKD stage IIIa Improved with hydration   Chronic diastolic CHF Well compensated at this time   Obesity Body mass index is 39.82 kg/m.  Procedures performed: none   Condition at discharge: good  Exam Physical Exam   75 year old male lying in the bed comfortably without any acute distress Eyes pupil equal round  reactive to light and accommodation, no scleral icterus, extraocular muscles intact neck supple, no jugular venous distention, no thyroid enlargement or tenderness Lungs clear to auscultation bilaterally no wheezing rales rhonchi crepitation Cardiovascular S1-S2 normal, bradycardia, no murmur rales or gallop Abdomen soft, benign Neuro alert and oriented, nonfocal Psych normal mood and affect Skin no rash or lesion  Disposition: Home  Discharge time: greater than 30 minutes. Allergies as of 12/11/2020       Reactions   Butorphanol Itching   Morphine Other (See Comments)   Redness around IV site   Pedi-pre Tape Spray [wound Dressing Adhesive] Itching   Simvastatin Other (See Comments)   "Destroyed my muscles"   Statins Other (See Comments)   "Destroyed my muscles"        Medication List     STOP taking these medications    Nexlizet 180-10 MG Tabs Generic drug: Bempedoic Acid-Ezetimibe       TAKE these medications    Advair HFA 230-21 MCG/ACT inhaler Generic drug: fluticasone-salmeterol Inhale 1-2 puffs into the lungs 2 (two) times daily. Rinse mouth after each use.   albuterol 108 (90 Base) MCG/ACT inhaler Commonly known as: VENTOLIN HFA Inhale 2 puffs into the lungs every 6 (six) hours as needed for wheezing or shortness of breath.   allopurinol 300 MG tablet Commonly known as: ZYLOPRIM Take 1 tablet (300 mg total) by mouth daily.   amLODipine 10 MG tablet Commonly known as: NORVASC Take 0.5 tablets (5 mg total) by mouth daily.   aspirin EC 81  MG tablet Take 81 mg by mouth daily. Swallow whole.   blood glucose meter kit and supplies Dispense based on patient and insurance preference. Use up to four times daily as directed. (FOR ICD-10 E10.9, E11.9). check blood glucose fasting and before meals four times daily.   Co Q-10 400 MG Caps Take by mouth daily at 6 (six) AM.   desoximetasone 0.25 % cream Commonly known as: TOPICORT Apply 1 application  topically 2 (two) times daily.   escitalopram 20 MG tablet Commonly known as: LEXAPRO Take 1 tablet (20 mg total) by mouth daily.   Fish Oil 1200 MG Caps Take by mouth daily at 6 (six) AM.   gabapentin 300 MG capsule Commonly known as: NEURONTIN Take 2 capsules (600 mg total) by mouth at bedtime.   lisinopril 5 MG tablet Commonly known as: ZESTRIL Take 0.5 tablets (2.5 mg total) by mouth daily.   lovastatin 40 MG tablet Commonly known as: MEVACOR Take 40 mg by mouth at bedtime.   nitroGLYCERIN 0.4 MG SL tablet Commonly known as: NITROSTAT Place 1 tablet (0.4 mg total) under the tongue every 5 (five) minutes x 3 doses as needed for chest pain.   ticagrelor 90 MG Tabs tablet Commonly known as: BRILINTA Take 1 tablet (90 mg total) by mouth 2 (two) times daily.   torsemide 20 MG tablet Commonly known as: DEMADEX Take 1 tablet (20 mg total) by mouth 2 (two) times daily.   traZODone 150 MG tablet Commonly known as: DESYREL Take 1-2 tablets (150-300 mg total) by mouth at bedtime as needed for sleep.   VITAMIN D3 PO Take 4,000 Units by mouth.        DG Chest 2 View  Result Date: 11/15/2020 CLINICAL DATA:  Shortness of breath.  Hospital discharge yesterday. EXAM: CHEST - 2 VIEW COMPARISON:  Radiograph 11/11/2020, CT 11/08/2020 FINDINGS: Stable cardiomediastinal contours post CABG. Broken sternal wires again seen. Right pleural effusions/pleural thickening with streaky opacities at the right lung base, similar. There is no pulmonary edema or pneumothorax. No acute airspace disease. Stable compression fracture in the lower thoracic spine. IMPRESSION: 1. No acute chest finding. 2. Stable right pleural effusion/pleural thickening and streaky basilar opacity. Electronically Signed   By: Keith Rake M.D.   On: 11/15/2020 16:54   CARDIAC CATHETERIZATION  Result Date: 11/12/2020   Dist LM lesion is 90% stenosed.   Mid LAD lesion is 100% stenosed.   Ost Cx to Prox Cx lesion is  100% stenosed.   Dist Graft lesion before 2nd Diag  is 99% stenosed.   Prox Graft lesion between 2nd Diag and Mid LAD  is 90% stenosed.   1st Diag lesion is 100% stenosed.   Insertion lesion before 2nd Diag  is 90% stenosed.   A drug-eluting stent was successfully placed using a STENT ONYX FRONTIER 4.0X15.   A drug-eluting stent was successfully placed using a STENT ONYX FRONTIER 4.0X15.   Post intervention, there is a 0% residual stenosis.   Post intervention, there is a 0% residual stenosis.   SVG and is large. Successful drug-eluting stent placement to the mid SVG to LAD/diagonal using distal protection device. Successful drug-eluting stent placement to the distal SVG to LAD/diagonal without distal protection device due to distal location. Recommendations: Dual antiplatelet therapy indefinitely. Aggressive treatment of risk factors. Recommend removing intra-aortic balloon pump at 8:30 PM to allow washout of heparin.   DG Chest Port 1 View  Result Date: 12/09/2020 CLINICAL DATA:  Acute respiratory failure with  hypoxia. EXAM: PORTABLE CHEST 1 VIEW COMPARISON:  12/08/2020 FINDINGS: Cardiac pads overlying the chest. Stable volume loss in the right hemithorax. Patchy linear densities in the mid and lower right lung are unchanged. Heart size is enlarged with median sternotomy wires. Negative for a pneumothorax. Stable prominent lung markings without overt pulmonary edema. IMPRESSION: 1. Stable chest radiograph findings. 2. Stable volume loss in the right hemithorax with patchy linear densities that are suggestive for scarring or atelectasis. Electronically Signed   By: Markus Daft M.D.   On: 12/09/2020 07:57   DG Chest Portable 1 View  Result Date: 12/08/2020 CLINICAL DATA:  Short of breath, dizziness, bradycardia EXAM: PORTABLE CHEST 1 VIEW COMPARISON:  11/15/2020 FINDINGS: Single frontal view of the chest demonstrates external defibrillator pads overlying the left chest. Stable median sternotomy wires. Cardiac  silhouette is unremarkable. Lung volumes are diminished, with increased central vascular congestion. Chronic right pleural thickening or trace pleural fluid unchanged. No pneumothorax. IMPRESSION: 1. Low lung volumes with increased central vascular congestion. No overt edema. Electronically Signed   By: Randa Ngo M.D.   On: 12/08/2020 15:58   ECHOCARDIOGRAM LIMITED  Result Date: 11/16/2020    ECHOCARDIOGRAM LIMITED REPORT   Patient Name:   Tony Williams Date of Exam: 11/16/2020 Medical Rec #:  [id number]        Height:       69.0 in Accession #:    [id number]       Weight:       267.9 lb Date of Birth:  1945/05/05        BSA:          2.339 m Patient Age:    32 years         BP:           157/61 mmHg Patient Gender: M                HR:           60 bpm. Exam Location:  Inpatient Procedure: 2D Echo, Limited Echo and Intracardiac Opacification Agent Indications:    R06.02 SOB  History:        Patient has prior history of Echocardiogram examinations, most                 recent 11/08/2020. Previous Myocardial Infarction, Prior CABG;                 Signs/Symptoms:Shortness of Breath.  Sonographer:    Merrie Roof RDCS Referring Phys: [place]  1. Left ventricular ejection fraction, by estimation, is 60 to 65%. The left ventricle has normal function. The left ventricle has no regional wall motion abnormalities. Left ventricular diastolic function could not be evaluated.  2. The right ventricular size is normal. Comparison(s): A prior study was performed on 11/08/2020. Prior images reviewed side by side. Limited study for left ventricular function. The apical wall motion abnormality has resolved and the overall left ventricular function has improved. FINDINGS  Left Ventricle: Left ventricular ejection fraction, by estimation, is 60 to 65%. The left ventricle has normal function. The left ventricle has no regional wall motion abnormalities. Definity contrast agent was given IV to delineate  the left ventricular  endocardial borders. Left ventricular diastolic function could not be evaluated. Right Ventricle: The right ventricular size is normal. Pericardium: There is no evidence of pericardial effusion. Sanda Klein MD Electronically signed by Sanda Klein MD Signature Date/Time: 11/16/2020/2:54:06 PM    Final  Results for orders placed or performed during the hospital encounter of 12/08/20  Resp Panel by RT-PCR (Flu A&B, Covid) Nasopharyngeal Swab     Status: None   Collection Time: 12/08/20  3:25 PM   Specimen: Nasopharyngeal Swab; Nasopharyngeal(NP) swabs in vial transport medium  Result Value Ref Range Status   SARS Coronavirus 2 by RT PCR NEGATIVE NEGATIVE Final    Comment: (NOTE) SARS-CoV-2 target nucleic acids are NOT DETECTED.  The SARS-CoV-2 RNA is generally detectable in upper respiratory specimens during the acute phase of infection. The lowest concentration of SARS-CoV-2 viral copies this assay can detect is 138 copies/mL. A negative result does not preclude SARS-Cov-2 infection and should not be used as the sole basis for treatment or other patient management decisions. A negative result may occur with  improper specimen collection/handling, submission of specimen other than nasopharyngeal swab, presence of viral mutation(s) within the areas targeted by this assay, and inadequate number of viral copies(<138 copies/mL). A negative result must be combined with clinical observations, patient history, and epidemiological information. The expected result is Negative.  Fact Sheet for Patients:  EntrepreneurPulse.com.au  Fact Sheet for Healthcare Providers:  IncredibleEmployment.be  This test is no t yet approved or cleared by the [place] FDA and  has been authorized for detection and/or diagnosis of SARS-CoV-2 by FDA under an Emergency Use Authorization (EUA). This EUA will remain  in effect (meaning this test can be  used) for the duration of the COVID-19 declaration under Section 564(b)(1) of the Act, 21 U.S.C.section 360bbb-3(b)(1), unless the authorization is terminated  or revoked sooner.       Influenza A by PCR NEGATIVE NEGATIVE Final   Influenza B by PCR NEGATIVE NEGATIVE Final    Comment: (NOTE) The Xpert Xpress SARS-CoV-2/FLU/RSV plus assay is intended as an aid in the diagnosis of influenza from Nasopharyngeal swab specimens and should not be used as a sole basis for treatment. Nasal washings and aspirates are unacceptable for Xpert Xpress SARS-CoV-2/FLU/RSV testing.  Fact Sheet for Patients: EntrepreneurPulse.com.au  Fact Sheet for Healthcare Providers: IncredibleEmployment.be  This test is not yet approved or cleared by the [place] FDA and has been authorized for detection and/or diagnosis of SARS-CoV-2 by FDA under an Emergency Use Authorization (EUA). This EUA will remain in effect (meaning this test can be used) for the duration of the COVID-19 declaration under Section 564(b)(1) of the Act, 21 U.S.C. section 360bbb-3(b)(1), unless the authorization is terminated or revoked.  Performed at [facility], [address], [place], [place] [address]     Signed:  Max Sane MD.  [facility] 12/11/2020, 10:00 PM

## 2020-12-11 NOTE — Progress Notes (Signed)
AVS given and explained to patient and spouse.

## 2020-12-12 ENCOUNTER — Telehealth: Payer: Self-pay | Admitting: Family Medicine

## 2020-12-12 ENCOUNTER — Telehealth: Payer: Self-pay

## 2020-12-12 NOTE — Telephone Encounter (Signed)
Medication Refill - Medication: hydrocodone 5-325 mg  Has the patient contacted their pharmacy? No controlled substance Preferred Pharmacy (with phone number or street name): [facility] [address] phone number [phone] Has the patient been seen for an appointment in the last year OR does the patient have an upcoming appointment? Yes.    Agent: Please be advised that RX refills may take up to 3 business days. We ask that you follow-up with your pharmacy.

## 2020-12-12 NOTE — Telephone Encounter (Signed)
Copied from [place] [id number]. Topic: Appointment Scheduling - Scheduling Inquiry for Clinic >> Dec 12, 2020 12:00 PM Lennox Solders wrote: Reason for CRM: Pt is calling and per pt needs post hospital follow up in 3 days. Pt was d/c 12-11-2020 with dr b

## 2020-12-15 ENCOUNTER — Telehealth: Payer: Self-pay

## 2020-12-15 NOTE — Telephone Encounter (Signed)
Medication is not in patient's med list.

## 2020-12-15 NOTE — Telephone Encounter (Addendum)
Nunzio Cory mccomic is not going to make it to her 10:40am appointment on Wednesday 12-17-20. You can put this patient in that slot. Marland Kitchen

## 2020-12-15 NOTE — Telephone Encounter (Signed)
Please advise if patient can be worked into someone's schedule.

## 2020-12-15 NOTE — Telephone Encounter (Signed)
Copied from [place] [phone]. Topic: Appointment Scheduling - Scheduling Inquiry for Clinic >> Dec 15, 2020 12:38 PM Loma Boston wrote: [facility] FU 1 [phone] Reason for CRM: Pls FU with pt, Pt has been admitted to ED around 4 times since end of August , Had a heart attack, admitted 2 more heart attack , sent to [facility] had stints put in,last week 9/26 ER again with heart rate 37, BP 90/40. He was told must see PCP within 3 business days for a HFU. Pt states critical to make appt. FU [phone]

## 2020-12-16 ENCOUNTER — Telehealth: Payer: Self-pay | Admitting: Cardiology

## 2020-12-16 ENCOUNTER — Other Ambulatory Visit: Payer: Self-pay

## 2020-12-16 ENCOUNTER — Ambulatory Visit (INDEPENDENT_AMBULATORY_CARE_PROVIDER_SITE_OTHER): Payer: Medicare Other | Admitting: Family Medicine

## 2020-12-16 ENCOUNTER — Encounter: Payer: Self-pay | Admitting: Family Medicine

## 2020-12-16 ENCOUNTER — Telehealth: Payer: Self-pay

## 2020-12-16 VITALS — BP 90/54 | HR 80 | Temp 97.7°F | Resp 15 | Wt 260.0 lb

## 2020-12-16 DIAGNOSIS — I252 Old myocardial infarction: Secondary | ICD-10-CM | POA: Diagnosis not present

## 2020-12-16 DIAGNOSIS — Z09 Encounter for follow-up examination after completed treatment for conditions other than malignant neoplasm: Secondary | ICD-10-CM | POA: Diagnosis not present

## 2020-12-16 DIAGNOSIS — R42 Dizziness and giddiness: Secondary | ICD-10-CM

## 2020-12-16 DIAGNOSIS — R001 Bradycardia, unspecified: Secondary | ICD-10-CM

## 2020-12-16 DIAGNOSIS — I950 Idiopathic hypotension: Secondary | ICD-10-CM | POA: Diagnosis not present

## 2020-12-16 MED ORDER — TORSEMIDE 20 MG PO TABS: 20.0000 mg | ORAL_TABLET | Freq: Every day | ORAL | 3 refills | Status: DC

## 2020-12-16 NOTE — Telephone Encounter (Signed)
Nurse case manager is calling states that patient cannot afford Repatha, and patient has stopped taking it. Please recommend an alternative. Please call patient

## 2020-12-16 NOTE — Assessment & Plan Note (Signed)
Previous hx Treated with stents what was able to be reached On brilinta

## 2020-12-16 NOTE — Telephone Encounter (Signed)
Patient is seeing Tony Williams 12/16/20 for hosp f/u

## 2020-12-16 NOTE — Telephone Encounter (Signed)
Called patient and requested a call back to discuss dizziness.  Looking into the patients chart, I see we stopped Repatha at his last office visit on 11/20/20 and started him on Nexlizet. It appears that his Nexlizet was stopped at discharge from hospital on 12/11/20.

## 2020-12-16 NOTE — Assessment & Plan Note (Signed)
HR has since recovered Off beta blockers Needs to schedule follow up with cardiology Has been checking HR at home Lowest HR seen in 50s

## 2020-12-16 NOTE — Assessment & Plan Note (Signed)
Request POC BG check today Was elevated in hospital- getting insulin discussed use of SQ insulin inpatient as it is able to be quickly reversed if needed

## 2020-12-16 NOTE — Telephone Encounter (Signed)
Transition Care Management Follow-up Telephone Call Date of discharge and from where: 12/11/20 from [facility] How have you been since you were released from the hospital? "I've been doing good" Any questions or concerns? No  Items Reviewed: Did the pt receive and understand the discharge instructions provided? Yes  Medications obtained and verified? Yes  Other? No  Any new allergies since your discharge? No  Dietary orders reviewed? Yes Do you have support at home? Yes   Home Care and Equipment/Supplies: Were home health services ordered? no If so, what is the name of the agency? Not applicable  Has the agency set up a time to come to the patient's home? not applicable Were any new equipment or medical supplies ordered?  No What is the name of the medical supply agency? Not applicable Were you able to get the supplies/equipment? not applicable Do you have any questions related to the use of the equipment or supplies? No  Functional Questionnaire: (I = Independent and D = Dependent) ADLs: I  Bathing/Dressing- I  Meal Prep- I  Eating- I  Maintaining continence- I  Transferring/Ambulation- I with cane  Managing Meds- I  Follow up appointments reviewed:  PCP Hospital f/u appt confirmed? Yes  Scheduled to see Dr. Brita Romp on 12/16/20 @ [facility] f/u appt confirmed? No  states he is going to call to schedule his 2 week follow up with cardiologist. States he had a previous appointment-Scheduled to see Dr. Kate Sable on 01/02/21 @ 1:20pm. Are transportation arrangements needed? No  If their condition worsens, is the pt aware to call PCP or go to the Emergency Dept.? Yes Was the patient provided with contact information for the PCP's office or ED? Yes Was to pt encouraged to call back with questions or concerns? Yes    Thea Silversmith, RN, MSN, BSN, [place] Care Management Coordinator [phone]

## 2020-12-16 NOTE — Telephone Encounter (Signed)
She states patient is still feeling dizziness and is using a cane to prevent falling. Please address this when calling patient. Patient thinks this symptom may be coming from one of his medications.

## 2020-12-16 NOTE — Assessment & Plan Note (Signed)
BP higher at home Checking with radial cuff Advised to continue to check BP and advise off of any BP <100 SBP Continue to take 2 BP medication; pt has self titrated diuretic to once/day

## 2020-12-16 NOTE — Telephone Encounter (Signed)
Called pt and left vm to call back to be worked in.  PEC please contact office to be worked in.

## 2020-12-16 NOTE — Assessment & Plan Note (Signed)
Was inpatient d/t bradycardia and low potassium levels- since recovered

## 2020-12-16 NOTE — Progress Notes (Signed)
Established patient visit   Patient: Tony Williams   DOB: 1945-04-26   75 y.o. Male  MRN: [id number] Visit Date: 12/16/2020  Today's healthcare provider: Gwyneth Sprout, FNP   Chief Complaint  Patient presents with   Hospitalization Follow-up   Subjective    HPI  Follow up Hospitalization  Patient was admitted to [facility] on 11/07/2020 and discharged on 11/10/2020. He was treated for Symptomatic sinus bradycardia   Hypotension   Hyperkalemia. Treatment for this included Given his symptomatic bradycardia, he was given atropine x2 doses with good response, along with glucagon.  Cardiology was consulted and saw him in the ED with recommendation for Dopamine drip if needed.  He received 500 cc of IV fluids for hypotension, and following fluids became a little hypoxic requiring 2-3L of supplemental O2.  Given his Hyperkalemia, he was given calcium gluconate and Lokelma.. Telephone follow up was done on N/A  He reports this condition has stayed the same Patient reports feeling dizzy and off balance. Using cane for support and is slow to change position to prevent falls.  -----------------------------------------------------------------------------------------   Medications: Outpatient Medications Prior to Visit  Medication Sig   albuterol (VENTOLIN HFA) 108 (90 Base) MCG/ACT inhaler Inhale 2 puffs into the lungs every 6 (six) hours as needed for wheezing or shortness of breath.   allopurinol (ZYLOPRIM) 300 MG tablet Take 1 tablet (300 mg total) by mouth daily.   amLODipine (NORVASC) 10 MG tablet Take 0.5 tablets (5 mg total) by mouth daily.   aspirin EC 81 MG tablet Take 81 mg by mouth daily. Swallow whole.   blood glucose meter kit and supplies Dispense based on patient and insurance preference. Use up to four times daily as directed. (FOR ICD-10 E10.9, E11.9). check blood glucose fasting and before meals four times daily.   Cholecalciferol (VITAMIN D3 PO) Take 4,000 Units by  mouth.   Coenzyme Q10 (CO Q-10) 400 MG CAPS Take by mouth daily at 6 (six) AM.   desoximetasone (TOPICORT) 0.25 % cream Apply 1 application topically 2 (two) times daily.   escitalopram (LEXAPRO) 20 MG tablet Take 1 tablet (20 mg total) by mouth daily.   fluticasone-salmeterol (ADVAIR HFA) 230-21 MCG/ACT inhaler Inhale 1-2 puffs into the lungs 2 (two) times daily. Rinse mouth after each use.   gabapentin (NEURONTIN) 300 MG capsule Take 2 capsules (600 mg total) by mouth at bedtime.   lisinopril (ZESTRIL) 5 MG tablet Take 0.5 tablets (2.5 mg total) by mouth daily.   lovastatin (MEVACOR) 40 MG tablet Take 40 mg by mouth at bedtime.   nitroGLYCERIN (NITROSTAT) 0.4 MG SL tablet Place 1 tablet (0.4 mg total) under the tongue every 5 (five) minutes x 3 doses as needed for chest pain.   Omega-3 Fatty Acids (FISH OIL) 1200 MG CAPS Take by mouth daily at 6 (six) AM.   ticagrelor (BRILINTA) 90 MG TABS tablet Take 1 tablet (90 mg total) by mouth 2 (two) times daily.   traZODone (DESYREL) 150 MG tablet Take 1-2 tablets (150-300 mg total) by mouth at bedtime as needed for sleep.   [DISCONTINUED] torsemide (DEMADEX) 20 MG tablet Take 1 tablet (20 mg total) by mouth 2 (two) times daily.   No facility-administered medications prior to visit.    Review of Systems     Objective    BP (!) 90/54   Pulse 80   Temp 97.7 F (36.5 C) (Oral)   Resp 15   Wt 260 lb (117.9 kg)  SpO2 99%   BMI 39.53 kg/m  {Show previous vital signs (optional):23777}  Physical Exam Vitals and nursing note reviewed.  Constitutional:      Appearance: Normal appearance. He is obese.  HENT:     Head: Normocephalic and atraumatic.  Eyes:     Pupils: Pupils are equal, round, and reactive to light.  Cardiovascular:     Rate and Rhythm: Normal rate and regular rhythm.     Pulses: Normal pulses.     Heart sounds: Normal heart sounds.  Pulmonary:     Effort: Pulmonary effort is normal.     Breath sounds: Normal breath  sounds.  Musculoskeletal:        General: Normal range of motion.     Cervical back: Normal range of motion.  Skin:    General: Skin is warm and dry.     Capillary Refill: Capillary refill takes less than 2 seconds.  Neurological:     General: No focal deficit present.     Mental Status: He is alert and oriented to person, place, and time. Mental status is at baseline.  Psychiatric:        Mood and Affect: Mood normal.        Thought Content: Thought content normal.        Judgment: Judgment normal.      No results found for any visits on 12/16/20.  Assessment & Plan     Problem List Items Addressed This Visit       Cardiovascular and Mediastinum   Idiopathic hypotension    BP higher at home Checking with radial cuff Advised to continue to check BP and advise off of any BP <100 SBP Continue to take 2 BP medication; pt has self titrated diuretic to once/day      Relevant Medications   torsemide (DEMADEX) 20 MG tablet     Other   Hospital discharge follow-up - Primary    Was inpatient d/t bradycardia and low potassium levels- since recovered      Hx of myocardial infarction    Previous hx Treated with stents what was able to be reached On brilinta       Bradycardia    HR has since recovered Off beta blockers Needs to schedule follow up with cardiology Has been checking HR at home Lowest HR seen in 50s      Dizziness    Request POC BG check today Was elevated in hospital- getting insulin discussed use of SQ insulin inpatient as it is able to be quickly reversed if needed        No follow-ups on file.     Vonna Kotyk, FNP, have reviewed all documentation for this visit. The documentation on 12/16/20 for the exam, diagnosis, procedures, and orders are all accurate and complete.    Gwyneth Sprout, [place] [phone] (phone) [phone] (fax)  [place]

## 2020-12-16 NOTE — Telephone Encounter (Signed)
TCM....  Patient  discharged [facility]    They are scheduled to see Agbor Etang 10-21 @ 120 pm   They were seen for  Symptomatic Tony Williams      Attempted to see sooner .  No ans no vm .     Please call

## 2020-12-17 NOTE — Telephone Encounter (Signed)
Called patient back and spoke with him. He stated that he has been feeling very dizzy, especially in the morning. He states that his BP has been running between 90/50-182/70 with heart rates in the 50's. I offered patient an earlier appointment than the 01/02/21 scheduled one. He is unable to come tomorrow, but did take an appointment on Friday 10/7 in 2 days.  I asked about his Nexlizet as it was stopped upon discharge from hospital. Patient stated that his 'pharmacy couldn't give it to him until the middle of October, as they are waiting for something from the providers office'?  I do not see anything in the chart regarding this. Will follow up with pharmacy and address at [place] on 10/7.  Saw the following from patients PCP OV note yesterday 12/16/20: BP higher at home Checking with radial cuff Advised to continue to check BP and advise off of any BP <100 SBP Continue to take 2 BP medication; pt has self titrated diuretic to once/day

## 2020-12-17 NOTE — Telephone Encounter (Signed)
Patient had a TCM call and TCM visit both yesterday by PCP. I did speak with patient today in a subsequent tele encounter listed as medication management. Closing encounter.

## 2020-12-17 NOTE — Telephone Encounter (Signed)
Patient returning call.

## 2020-12-19 ENCOUNTER — Telehealth: Payer: Medicare Other

## 2020-12-19 ENCOUNTER — Other Ambulatory Visit: Payer: Self-pay

## 2020-12-19 ENCOUNTER — Ambulatory Visit: Payer: Medicare Other | Admitting: Cardiology

## 2020-12-19 ENCOUNTER — Encounter: Payer: Self-pay | Admitting: Cardiology

## 2020-12-19 VITALS — BP 93/59 | HR 80 | Ht 69.0 in | Wt 259.0 lb

## 2020-12-19 DIAGNOSIS — I10 Essential (primary) hypertension: Secondary | ICD-10-CM | POA: Diagnosis not present

## 2020-12-19 DIAGNOSIS — E7800 Pure hypercholesterolemia, unspecified: Secondary | ICD-10-CM

## 2020-12-19 DIAGNOSIS — I2510 Atherosclerotic heart disease of native coronary artery without angina pectoris: Secondary | ICD-10-CM

## 2020-12-19 NOTE — Progress Notes (Signed)
Cardiology Office Note:    Date:  12/19/2020   ID:  Tony Williams, DOB Aug 11, 1945, MRN [id number]  PCP:  Virginia Crews, [place]  Cardiologist:  Kate Sable, MD  Advanced Practice Provider:  No care team member to display Electrophysiologist:  None       Referring MD: Virginia Crews, MD   Chief Complaint  Patient presents with   Other    Patient c.o dizzy, weak, and tired. Meds reviewed verbally with patient.      History of Present Illness:    Tony Williams is a 75 y.o. male with a hx of CAD/CABG x 4 2007, PCI x 2 2018, PCI x 1 SVG-LAD/Diagonal 2022, former smoker, possible COPD, OSA, obesity diabetes who presents for follow-up.  Recently seen in the hospital due to hypotension and bradycardia in the setting of overmedication with amlodipine and metoprolol.  Metoprolol.  With improvements in heart rates.  He was subsequently discharged, states checking his blood pressure at home with systolics 828M 034J, he has a wrist cuff.  BP is in the office today were low.  He complains of dizziness, fatigue.  Denies edema.  He takes lisinopril 2.5 mg daily, Norvasc 5 mg daily.   Prior notes Limited echo 11/2020 EF 60 to 65%, indeterminate diastolic function Echo 03/7913 normal systolic function, indeterminate diastolic function, EF 60 to 65%. Lexiscan Myoview, fixed perfusion defect, no evidence for ischemia. Patient recently moved to the area from [facility].  States having CAD/CABG x4 back in 2007, in 2018 had a myocardial infarction, had 2 stents placed.  He is intolerant to statins, has tried multiple statins and not able to tolerate due to myalgias.  Currently able to tolerate low-dose lovastatin.    Had sleep study in the past and was diagnosed with OSA, he declines using his CPAP mask.  Does not like the feel for the mask.    Past Medical History:  Diagnosis Date   CAD S/P percutaneous coronary angioplasty     CAD, multiple vessel    Cancer ([place]) 2013   Bladder   Chronic kidney disease    COPD (chronic obstructive pulmonary disease) (HCC)    Depression    Diabetes mellitus without complication (HCC)    Heart problem    Hx of CABG x 4    LIMA-D2, SVG-OM (occluded), SeqSVG-D1-LAD.   Myocardial infarction [facility])     Past Surgical History:  Procedure Laterality Date   CHOLECYSTECTOMY, LAPAROSCOPIC  12/26/1995   COLONOSCOPY WITH PROPOFOL N/A 09/23/2020   Procedure: COLONOSCOPY WITH PROPOFOL;  Surgeon: Lucilla Lame, MD;  Location: [facility] ENDOSCOPY;  Service: Endoscopy;  Laterality: N/A;   CORONARY ARTERY BYPASS GRAFT     LIMA-D2, SVG-OM, SeqSVG-D1-LAD   CORONARY STENT INTERVENTION N/A 11/12/2020   Procedure: CORONARY STENT INTERVENTION;  Surgeon: Wellington Hampshire, MD;  Location: [place] CV LAB;  Service: Cardiovascular;  Laterality: N/A;   EYE SURGERY     IABP INSERTION N/A 11/11/2020   Procedure: IABP Insertion;  Surgeon: Nelva Bush, MD;  Location: [place] CV LAB;  Service: Cardiovascular;  Laterality: N/A;   IR STENT PLACEMENT ANTE CAROTID INC ANGIO     KNEE SURGERY     RIGHT/LEFT HEART CATH AND CORONARY/GRAFT ANGIOGRAPHY N/A 11/11/2020   Procedure: RIGHT/LEFT HEART CATH AND CORONARY/GRAFT ANGIOGRAPHY;  Surgeon: Nelva Bush, MD;  Location: [place] CV LAB;  Service: Cardiovascular;  Severe native CAD: 90% dLMCA-100% mLAD/100% ost LCx CTO w/ Severe  diffuse RCA disease. Widely patent LIMA-D1. Patent sequential SVG-D2-LAD with 99% mid graft & 90% LIMA-D2 @ anastomosis. previously stented SVG-OM - CTO. Mildly   TRANSURETHRAL RESECTION OF PROSTATE  1994   URINARY SPHINCTER IMPLANT  1995    Current Medications: Current Meds  Medication Sig   albuterol (VENTOLIN HFA) 108 (90 Base) MCG/ACT inhaler Inhale 2 puffs into the lungs every 6 (six) hours as needed for wheezing or shortness of breath.   allopurinol (ZYLOPRIM) 300 MG tablet Take 1 tablet (300 mg total) by mouth daily.    aspirin EC 81 MG tablet Take 81 mg by mouth daily. Swallow whole.   blood glucose meter kit and supplies Dispense based on patient and insurance preference. Use up to four times daily as directed. (FOR ICD-10 E10.9, E11.9). check blood glucose fasting and before meals four times daily.   Cholecalciferol (VITAMIN D3 PO) Take 4,000 Units by mouth.   Coenzyme Q10 (CO Q-10) 400 MG CAPS Take by mouth daily at 6 (six) AM.   desoximetasone (TOPICORT) 0.25 % cream Apply 1 application topically 2 (two) times daily.   escitalopram (LEXAPRO) 20 MG tablet Take 1 tablet (20 mg total) by mouth daily.   fluticasone-salmeterol (ADVAIR HFA) 230-21 MCG/ACT inhaler Inhale 1-2 puffs into the lungs 2 (two) times daily. Rinse mouth after each use.   gabapentin (NEURONTIN) 300 MG capsule Take 2 capsules (600 mg total) by mouth at bedtime.   lisinopril (ZESTRIL) 5 MG tablet Take 0.5 tablets (2.5 mg total) by mouth daily.   lovastatin (MEVACOR) 40 MG tablet Take 40 mg by mouth at bedtime.   nitroGLYCERIN (NITROSTAT) 0.4 MG SL tablet Place 1 tablet (0.4 mg total) under the tongue every 5 (five) minutes x 3 doses as needed for chest pain.   Omega-3 Fatty Acids (FISH OIL) 1200 MG CAPS Take by mouth daily at 6 (six) AM.   ticagrelor (BRILINTA) 90 MG TABS tablet Take 1 tablet (90 mg total) by mouth 2 (two) times daily.   torsemide (DEMADEX) 20 MG tablet Take 1 tablet (20 mg total) by mouth daily.   traZODone (DESYREL) 150 MG tablet Take 1-2 tablets (150-300 mg total) by mouth at bedtime as needed for sleep.   [DISCONTINUED] amLODipine (NORVASC) 10 MG tablet Take 0.5 tablets (5 mg total) by mouth daily.     Allergies:   Butorphanol, Morphine, Pedi-pre tape spray [wound dressing adhesive], Simvastatin, and Statins   Social History   Socioeconomic History   Marital status: Married    Spouse name: Not on file   Number of children: 2   Years of education: Not on file   Highest education level: High school graduate   Occupational History   Occupation: retired  Tobacco Use   Smoking status: Former    Types: Cigarettes    Quit date: 10/23/2003    Years since quitting: 17.1   Smokeless tobacco: Never  Vaping Use   Vaping Use: Never used  Substance and Sexual Activity   Alcohol use: Never   Drug use: Never   Sexual activity: Not on file  Other Topics Concern   Not on file  Social History Narrative   Not on file   Social Determinants of Health   Financial Resource Strain: Low Risk    Difficulty of Paying Living Expenses: Not hard at all  Food Insecurity: No Food Insecurity   Worried About Charity fundraiser in the Last Year: Never true   [place] in the Last Year:  Never true  Transportation Needs: No Transportation Needs   Lack of Transportation (Medical): No   Lack of Transportation (Non-Medical): No  Physical Activity: Inactive   Days of Exercise per Week: 0 days   Minutes of Exercise per Session: 0 min  Stress: No Stress Concern Present   Feeling of Stress : Not at all  Social Connections: Moderately Isolated   Frequency of Communication with Friends and Family: More than three times a week   Frequency of Social Gatherings with Friends and Family: Three times a week   Attends Religious Services: Never   Active Member of Clubs or Organizations: No   Attends Archivist Meetings: Never   Marital Status: Married     Family History: The patient's family history includes Heart Problems in his father and mother.  ROS:   Please see the history of present illness.     All other systems reviewed and are negative.  EKGs/Labs/Other Studies Reviewed:    The following studies were reviewed today:   EKG:  EKG is  ordered today.  EKG shows sinus rhythm, heart rate 80  Recent Labs: 12/08/2020: ALT 13; B Natriuretic Peptide 142.7; Magnesium 2.4; TSH 2.515 12/09/2020: BUN 49; Creatinine, Ser 2.47; Hemoglobin 12.9; Platelets 195; Potassium 4.4; Sodium 136  Recent Lipid  Panel    Component Value Date/Time   CHOL 123 11/09/2020 0436   CHOL 156 07/21/2020 1440   TRIG 219 (H) 11/09/2020 0436   HDL 32 (L) 11/09/2020 0436   HDL 34 (L) 07/21/2020 1440   CHOLHDL 3.8 11/09/2020 0436   VLDL 44 (H) 11/09/2020 0436   LDLCALC 47 11/09/2020 0436   LDLCALC 84 07/21/2020 1440     Risk Assessment/Calculations:      Physical Exam:    VS:  BP (!) 93/59 (BP Location: Left Arm, Patient Position: Sitting, Cuff Size: Large)   Pulse 80   Ht 5' 9"  (1.753 m)   Wt 259 lb (117.5 kg)   SpO2 93%   BMI 38.25 kg/m     Wt Readings from Last 3 Encounters:  12/19/20 259 lb (117.5 kg)  12/16/20 260 lb (117.9 kg)  12/08/20 261 lb 14.5 oz (118.8 kg)     GEN:  Well nourished, well developed in no acute distress HEENT: Normal NECK: No JVD; No carotid bruits LYMPHATICS: No lymphadenopathy CARDIAC: RRR, no murmurs, rubs, gallops RESPIRATORY: Decreased breath sounds at bases, ABDOMEN: Soft, non-tender, distended MUSCULOSKELETAL:  No edema; No deformity  SKIN: Warm and dry NEUROLOGIC:  Alert and oriented x 3 PSYCHIATRIC:  Normal affect   ASSESSMENT:    1. Coronary artery disease involving native coronary artery of native heart, unspecified whether angina present   2. Primary hypertension   3. Pure hypercholesterolemia      PLAN:    In order of problems listed above:  History of CABG x4, PCI x2.  Recent PCI x2 to SVG.  Continue aspirin, lovastatin. Hypertension, BP low, continue lisinopril, stop amlodipine.  Advised to check blood pressure using an arm cuff.  We may let BP run a little higher if patient's symptoms improve Hyperlipidemia, low-dose lovastatin, start Nexlizet.  Not tolerant to higher doses of statin.  Follow-up in 4-6 weeks.   Medication Adjustments/Labs and Tests Ordered: Current medicines are reviewed at length with the patient today.  Concerns regarding medicines are outlined above.  Orders Placed This Encounter  Procedures   EKG 12-Lead      No orders of the defined types were placed in this  encounter.    Patient Instructions  Medication Instructions:   Your physician has recommended you make the following change in your medication:   STOP Amlodipine   *If you need a refill on your cardiac medications before your next appointment, please call your pharmacy*   Lab Work:  None ordered  Testing/Procedures:  None ordered   Follow-Up: At [facility], you and your health needs are our priority.  As part of our continuing mission to provide you with exceptional heart care, we have created designated Provider Care Teams.  These Care Teams include your primary Cardiologist (physician) and Advanced Practice Providers (APPs -  Physician Assistants and Nurse Practitioners) who all work together to provide you with the care you need, when you need it.  We recommend signing up for the patient portal called "MyChart".  Sign up information is provided on this After Visit Summary.  MyChart is used to connect with patients for Virtual Visits (Telemedicine).  Patients are able to view lab/test results, encounter notes, upcoming appointments, etc.  Non-urgent messages can be sent to your provider as well.   To learn more about what you can do with MyChart, go to [email or web address].    Your next appointment:   1 month(s)  The format for your next appointment:   In Person  Provider:   You may see Kate Sable, MD or one of the following Advanced Practice Providers on your designated Care Team:   Murray Hodgkins, NP Christell Faith, PA-C Marrianne Mood, PA-C Cadence Kathlen Mody, [place]   Other Instructions  Monitor blood pressure at home. Take BP approximately 2 hours after you take your morning medication.  Call our office with BP readings [phone]) [phone].   Signed, Kate Sable, MD  12/19/2020 5:10 PM    [facility]

## 2020-12-19 NOTE — Patient Instructions (Signed)
Medication Instructions:   Your physician has recommended you make the following change in your medication:   STOP Amlodipine   *If you need a refill on your cardiac medications before your next appointment, please call your pharmacy*   Lab Work:  None ordered  Testing/Procedures:  None ordered   Follow-Up: At [facility], you and your health needs are our priority.  As part of our continuing mission to provide you with exceptional heart care, we have created designated Provider Care Teams.  These Care Teams include your primary Cardiologist (physician) and Advanced Practice Providers (APPs -  Physician Assistants and Nurse Practitioners) who all work together to provide you with the care you need, when you need it.  We recommend signing up for the patient portal called "MyChart".  Sign up information is provided on this After Visit Summary.  MyChart is used to connect with patients for Virtual Visits (Telemedicine).  Patients are able to view lab/test results, encounter notes, upcoming appointments, etc.  Non-urgent messages can be sent to your provider as well.   To learn more about what you can do with MyChart, go to [email or web address].    Your next appointment:   1 month(s)  The format for your next appointment:   In Person  Provider:   You may see Kate Sable, MD or one of the following Advanced Practice Providers on your designated Care Team:   Murray Hodgkins, NP Christell Faith, PA-C Marrianne Mood, PA-C Cadence Kathlen Mody, [place]   Other Instructions  Monitor blood pressure at home. Take BP approximately 2 hours after you take your morning medication.  Call our office with BP readings [phone]) [phone].

## 2020-12-22 ENCOUNTER — Telehealth: Payer: Self-pay | Admitting: Family Medicine

## 2020-12-22 NOTE — Telephone Encounter (Signed)
Medication Refill - Medication: HYDROcodone-acetaminophen (NORCO) 7.5-325 MG per tablet 1 tablet   Has the patient contacted their pharmacy? yes (Agent: If no, request that the patient contact the pharmacy for the refill.) (Agent: If yes, when and what did the pharmacy advise?)contact pcp  Preferred Pharmacy (with phone number or street name):  [id number] - [place], [place] - [place] Phone:  [phone]  Fax:  [phone]     Has the patient been seen for an appointment in the last year OR does the patient have an upcoming appointment? yes  Agent: Please be advised that RX refills may take up to 3 business days. We ask that you follow-up with your pharmacy.

## 2020-12-24 ENCOUNTER — Telehealth: Payer: Self-pay | Admitting: Cardiology

## 2020-12-24 NOTE — Telephone Encounter (Signed)
Pt called back to report that he is completely out of his current supply, please advise. Has questions for clinic, requesting call back   Best contact: [phone]

## 2020-12-24 NOTE — Telephone Encounter (Signed)
Patient calling with bp readings.  10/7  148/47 10/8  109/56 10/9  132/78 10/10  105/ 66 10/11  126/74 10/12  119/69

## 2020-12-26 ENCOUNTER — Ambulatory Visit: Payer: Medicare Other | Admitting: Cardiology

## 2020-12-26 ENCOUNTER — Other Ambulatory Visit: Payer: Self-pay

## 2020-12-26 ENCOUNTER — Encounter: Payer: Self-pay | Admitting: Cardiology

## 2020-12-26 VITALS — BP 106/50 | HR 77 | Ht 69.0 in | Wt 260.0 lb

## 2020-12-26 DIAGNOSIS — I951 Orthostatic hypotension: Secondary | ICD-10-CM | POA: Diagnosis not present

## 2020-12-26 DIAGNOSIS — E7800 Pure hypercholesterolemia, unspecified: Secondary | ICD-10-CM | POA: Diagnosis not present

## 2020-12-26 DIAGNOSIS — I2510 Atherosclerotic heart disease of native coronary artery without angina pectoris: Secondary | ICD-10-CM

## 2020-12-26 MED ORDER — TORSEMIDE 20 MG PO TABS: 20.0000 mg | ORAL_TABLET | Freq: Every day | ORAL | 3 refills | Status: DC | PRN

## 2020-12-26 NOTE — Telephone Encounter (Signed)
Per coleen offered same day DOD appt .   Patient agreed and will come today at 320 for eval.    No further questions at this time and will call back with any changes or needs prior to appt this afternoon .

## 2020-12-26 NOTE — Patient Instructions (Signed)
Medication Instructions:   Your physician has recommended you make the following change in your medication:    STOP taking your Lisinopril.  2.   Only take your Torsemide 20 MG AS NEEDED for shortness of breath or swelling.   *If you need a refill on your cardiac medications before your next appointment, please call your pharmacy*   Lab Work: None ordered If you have labs (blood work) drawn today and your tests are completely normal, you will receive your results only by: [place] (if you have MyChart) OR A paper copy in the mail If you have any lab test that is abnormal or we need to change your treatment, we will call you to review the results.   Testing/Procedures: None ordered   Follow-Up: At [facility], you and your health needs are our priority.  As part of our continuing mission to provide you with exceptional heart care, we have created designated Provider Care Teams.  These Care Teams include your primary Cardiologist (physician) and Advanced Practice Providers (APPs -  Physician Assistants and Nurse Practitioners) who all work together to provide you with the care you need, when you need it.  We recommend signing up for the patient portal called "MyChart".  Sign up information is provided on this After Visit Summary.  MyChart is used to connect with patients for Virtual Visits (Telemedicine).  Patients are able to view lab/test results, encounter notes, upcoming appointments, etc.  Non-urgent messages can be sent to your provider as well.   To learn more about what you can do with MyChart, go to [email or web address].    Your next appointment:   Keep follow up as already scheduled   The format for your next appointment:   In Person  Provider:   Kate Sable, MD   Other Instructions

## 2020-12-26 NOTE — Telephone Encounter (Signed)
Pt c/o BP issue: STAT if pt c/o blurred vision, one-sided weakness or slurred speech  1. What are your last 5 BP readings?    80/53 94/ 50's after fall in recliner   2. Are you having any other symptoms (ex. Dizziness, headache, blurred vision, passed out)? LOC on toilet and fell on the way back to bed   3. What is your BP issue? Syncope hypotension patient wants eval

## 2020-12-26 NOTE — Telephone Encounter (Signed)
Noted  

## 2020-12-26 NOTE — Progress Notes (Signed)
Cardiology Office Note:    Date:  12/26/2020   ID:  [name], DOB 06-Dec-1945, MRN [id number]  PCP:  Virginia Crews, [place]  Cardiologist:  Kate Sable, MD  Advanced Practice Provider:  No care team member to display Electrophysiologist:  None       Referring MD: Virginia Crews, MD   Chief Complaint  Patient presents with   Other    Patient c/o falling twice this morning -- Patient c.o hitting his head and shoulder. Wife stated its due to dizziness but patient states its due to BP dropping. Meds reviewed verbally with patient.      History of Present Illness:    Tony Williams is a 75 y.o. male with a hx of CAD/CABG x 4 2007, PCI x 2 2018, PCI x 1 SVG-LAD/Diagonal 2022, former smoker, possible COPD, OSA, obesity diabetes who presents for follow-up.  Last seen due to dizziness and low blood pressures.  Amlodipine was stopped, lisinopril 2.5 mg continued.  He states feeling dizzy, and falling hitting his head today.  Checked his blood pressure with systolic in the 23N, prompting clinic visit today.    Prior notes Limited echo 11/2020 EF 60 to 65%, indeterminate diastolic function Echo 05/6142 normal systolic function, indeterminate diastolic function, EF 60 to 65%. Lexiscan Myoview, fixed perfusion defect, no evidence for ischemia. Patient recently moved to the area from [facility].  States having CAD/CABG x4 back in 2007, in 2018 had a myocardial infarction, had 2 stents placed.  He is intolerant to statins, has tried multiple statins and not able to tolerate due to myalgias.  Currently able to tolerate low-dose lovastatin.    Had sleep study in the past and was diagnosed with OSA, he declines using his CPAP mask.  Does not like the feel for the mask.    Past Medical History:  Diagnosis Date   CAD S/P percutaneous coronary angioplasty    CAD, multiple vessel    Cancer ([place]) 2013   Bladder   Chronic  kidney disease    COPD (chronic obstructive pulmonary disease) (HCC)    Depression    Diabetes mellitus without complication (HCC)    Heart problem    Hx of CABG x 4    LIMA-D2, SVG-OM (occluded), SeqSVG-D1-LAD.   Myocardial infarction [facility])     Past Surgical History:  Procedure Laterality Date   CHOLECYSTECTOMY, LAPAROSCOPIC  12/26/1995   COLONOSCOPY WITH PROPOFOL N/A 09/23/2020   Procedure: COLONOSCOPY WITH PROPOFOL;  Surgeon: Lucilla Lame, MD;  Location: [facility] ENDOSCOPY;  Service: Endoscopy;  Laterality: N/A;   CORONARY ARTERY BYPASS GRAFT     LIMA-D2, SVG-OM, SeqSVG-D1-LAD   CORONARY STENT INTERVENTION N/A 11/12/2020   Procedure: CORONARY STENT INTERVENTION;  Surgeon: Wellington Hampshire, MD;  Location: [place] CV LAB;  Service: Cardiovascular;  Laterality: N/A;   EYE SURGERY     IABP INSERTION N/A 11/11/2020   Procedure: IABP Insertion;  Surgeon: Nelva Bush, MD;  Location: [place] CV LAB;  Service: Cardiovascular;  Laterality: N/A;   IR STENT PLACEMENT ANTE CAROTID INC ANGIO     KNEE SURGERY     RIGHT/LEFT HEART CATH AND CORONARY/GRAFT ANGIOGRAPHY N/A 11/11/2020   Procedure: RIGHT/LEFT HEART CATH AND CORONARY/GRAFT ANGIOGRAPHY;  Surgeon: Nelva Bush, MD;  Location: [place] CV LAB;  Service: Cardiovascular;  Severe native CAD: 90% dLMCA-100% mLAD/100% ost LCx CTO w/ Severe diffuse RCA disease. Widely patent LIMA-D1. Patent sequential SVG-D2-LAD with 99% mid  graft & 90% LIMA-D2 @ anastomosis. previously stented SVG-OM - CTO. Mildly   TRANSURETHRAL RESECTION OF PROSTATE  1994   URINARY SPHINCTER IMPLANT  1995    Current Medications: Current Meds  Medication Sig   albuterol (VENTOLIN HFA) 108 (90 Base) MCG/ACT inhaler Inhale 2 puffs into the lungs every 6 (six) hours as needed for wheezing or shortness of breath.   allopurinol (ZYLOPRIM) 300 MG tablet Take 1 tablet (300 mg total) by mouth daily.   aspirin EC 81 MG tablet Take 81 mg by mouth daily. Swallow  whole.   blood glucose meter kit and supplies Dispense based on patient and insurance preference. Use up to four times daily as directed. (FOR ICD-10 E10.9, E11.9). check blood glucose fasting and before meals four times daily.   Cholecalciferol (VITAMIN D3 PO) Take 4,000 Units by mouth.   Coenzyme Q10 (CO Q-10) 400 MG CAPS Take by mouth daily at 6 (six) AM.   desoximetasone (TOPICORT) 0.25 % cream Apply 1 application topically 2 (two) times daily.   escitalopram (LEXAPRO) 20 MG tablet Take 1 tablet (20 mg total) by mouth daily.   fluticasone-salmeterol (ADVAIR HFA) 230-21 MCG/ACT inhaler Inhale 1-2 puffs into the lungs 2 (two) times daily. Rinse mouth after each use.   gabapentin (NEURONTIN) 300 MG capsule Take 2 capsules (600 mg total) by mouth at bedtime.   lovastatin (MEVACOR) 40 MG tablet Take 40 mg by mouth at bedtime.   nitroGLYCERIN (NITROSTAT) 0.4 MG SL tablet Place 1 tablet (0.4 mg total) under the tongue every 5 (five) minutes x 3 doses as needed for chest pain.   Omega-3 Fatty Acids (FISH OIL) 1200 MG CAPS Take by mouth daily at 6 (six) AM.   ticagrelor (BRILINTA) 90 MG TABS tablet Take 1 tablet (90 mg total) by mouth 2 (two) times daily.   traZODone (DESYREL) 150 MG tablet Take 1-2 tablets (150-300 mg total) by mouth at bedtime as needed for sleep.   [DISCONTINUED] lisinopril (ZESTRIL) 5 MG tablet Take 0.5 tablets (2.5 mg total) by mouth daily.   [DISCONTINUED] torsemide (DEMADEX) 20 MG tablet Take 1 tablet (20 mg total) by mouth daily.     Allergies:   Butorphanol, Morphine, Pedi-pre tape spray [wound dressing adhesive], Simvastatin, and Statins   Social History   Socioeconomic History   Marital status: Married    Spouse name: Not on file   Number of children: 2   Years of education: Not on file   Highest education level: High school graduate  Occupational History   Occupation: retired  Tobacco Use   Smoking status: Former    Types: Cigarettes    Quit date: 10/23/2003     Years since quitting: 17.1   Smokeless tobacco: Never  Vaping Use   Vaping Use: Never used  Substance and Sexual Activity   Alcohol use: Never   Drug use: Never   Sexual activity: Not on file  Other Topics Concern   Not on file  Social History Narrative   Not on file   Social Determinants of Health   Financial Resource Strain: Low Risk    Difficulty of Paying Living Expenses: Not hard at all  Food Insecurity: No Food Insecurity   Worried About Charity fundraiser in the Last Year: Never true   [place] in the Last Year: Never true  Transportation Needs: No Transportation Needs   Lack of Transportation (Medical): No   Lack of Transportation (Non-Medical): No  Physical Activity: Inactive  Days of Exercise per Week: 0 days   Minutes of Exercise per Session: 0 min  Stress: No Stress Concern Present   Feeling of Stress : Not at all  Social Connections: Moderately Isolated   Frequency of Communication with Friends and Family: More than three times a week   Frequency of Social Gatherings with Friends and Family: Three times a week   Attends Religious Services: Never   Active Member of Clubs or Organizations: No   Attends Archivist Meetings: Never   Marital Status: Married     Family History: The patient's family history includes Heart Problems in his father and mother.  ROS:   Please see the history of present illness.     All other systems reviewed and are negative.  EKGs/Labs/Other Studies Reviewed:    The following studies were reviewed today:   EKG:  EKG is  ordered today.  EKG shows sinus rhythm, heart rate 80  Recent Labs: 12/08/2020: ALT 13; B Natriuretic Peptide 142.7; Magnesium 2.4; TSH 2.515 12/09/2020: BUN 49; Creatinine, Ser 2.47; Hemoglobin 12.9; Platelets 195; Potassium 4.4; Sodium 136  Recent Lipid Panel    Component Value Date/Time   CHOL 123 11/09/2020 0436   CHOL 156 07/21/2020 1440   TRIG 219 (H) 11/09/2020 0436   HDL 32 (L)  11/09/2020 0436   HDL 34 (L) 07/21/2020 1440   CHOLHDL 3.8 11/09/2020 0436   VLDL 44 (H) 11/09/2020 0436   LDLCALC 47 11/09/2020 0436   LDLCALC 84 07/21/2020 1440     Risk Assessment/Calculations:      Physical Exam:    VS:  BP (!) 106/50 (BP Location: Right Arm, Patient Position: Sitting, Cuff Size: Large)   Pulse 77   Ht 5' 9"  (1.753 m)   Wt 260 lb (117.9 kg)   SpO2 96%   BMI 38.40 kg/m     Wt Readings from Last 3 Encounters:  12/26/20 260 lb (117.9 kg)  12/19/20 259 lb (117.5 kg)  12/16/20 260 lb (117.9 kg)     GEN:  Well nourished, well developed in no acute distress HEENT: Normal NECK: No JVD; No carotid bruits LYMPHATICS: No lymphadenopathy CARDIAC: RRR, no murmurs, rubs, gallops RESPIRATORY: Decreased breath sounds at bases, ABDOMEN: Soft, non-tender, distended MUSCULOSKELETAL:  No edema; No deformity  SKIN: Warm and dry NEUROLOGIC:  Alert and oriented x 3 PSYCHIATRIC:  Normal affect   ASSESSMENT:    1. Orthostatic hypotension   2. Coronary artery disease involving native coronary artery of native heart, unspecified whether angina present   3. Pure hypercholesterolemia     PLAN:    In order of problems listed above:  Hypertension, dizziness, orthostatic vitals in the office today with positive with systolic drop from 161--->09 moving from sitting to standing.  Stop lisinopril, take torsemide only as needed.  Check BP frequently at home.  If blood pressure remains low, will consider adding midodrine at follow-up visit. History of CABG x4, PCI x2.  Recent PCI x2 to SVG.  Last echo with EF 60%.  Continue aspirin, lovastatin. Hyperlipidemia, low-dose lovastatin, start Nexlizet versus Zetia.  Not tolerant to higher doses of statin.  Follow-up in 1 month.   Medication Adjustments/Labs and Tests Ordered: Current medicines are reviewed at length with the patient today.  Concerns regarding medicines are outlined above.  Orders Placed This Encounter   Procedures   EKG 12-Lead      Meds ordered this encounter  Medications   torsemide (DEMADEX) 20 MG tablet  Sig: Take 1 tablet (20 mg total) by mouth daily as needed.    Dispense:  30 tablet    Refill:  3      Patient Instructions  Medication Instructions:   Your physician has recommended you make the following change in your medication:    STOP taking your Lisinopril.  2.   Only take your Torsemide 20 MG AS NEEDED for shortness of breath or swelling.   *If you need a refill on your cardiac medications before your next appointment, please call your pharmacy*   Lab Work: None ordered If you have labs (blood work) drawn today and your tests are completely normal, you will receive your results only by: [place] (if you have MyChart) OR A paper copy in the mail If you have any lab test that is abnormal or we need to change your treatment, we will call you to review the results.   Testing/Procedures: None ordered   Follow-Up: At [facility], you and your health needs are our priority.  As part of our continuing mission to provide you with exceptional heart care, we have created designated Provider Care Teams.  These Care Teams include your primary Cardiologist (physician) and Advanced Practice Providers (APPs -  Physician Assistants and Nurse Practitioners) who all work together to provide you with the care you need, when you need it.  We recommend signing up for the patient portal called "MyChart".  Sign up information is provided on this After Visit Summary.  MyChart is used to connect with patients for Virtual Visits (Telemedicine).  Patients are able to view lab/test results, encounter notes, upcoming appointments, etc.  Non-urgent messages can be sent to your provider as well.   To learn more about what you can do with MyChart, go to [email or web address].    Your next appointment:   Keep follow up as already scheduled   The format for your next  appointment:   In Person  Provider:   Kate Sable, MD   Other Instructions    Signed, Kate Sable, MD  12/26/2020 5:01 PM    [place]

## 2020-12-29 ENCOUNTER — Other Ambulatory Visit: Payer: Self-pay | Admitting: Family Medicine

## 2020-12-29 ENCOUNTER — Encounter: Payer: Self-pay | Admitting: Physician Assistant

## 2020-12-29 ENCOUNTER — Other Ambulatory Visit: Payer: Self-pay

## 2020-12-29 ENCOUNTER — Ambulatory Visit (INDEPENDENT_AMBULATORY_CARE_PROVIDER_SITE_OTHER): Payer: Medicare Other | Admitting: Physician Assistant

## 2020-12-29 VITALS — BP 139/68 | HR 64 | Temp 97.9°F | Resp 16 | Wt 262.4 lb

## 2020-12-29 DIAGNOSIS — E1159 Type 2 diabetes mellitus with other circulatory complications: Secondary | ICD-10-CM

## 2020-12-29 DIAGNOSIS — F411 Generalized anxiety disorder: Secondary | ICD-10-CM | POA: Diagnosis not present

## 2020-12-29 DIAGNOSIS — I152 Hypertension secondary to endocrine disorders: Secondary | ICD-10-CM | POA: Diagnosis not present

## 2020-12-29 DIAGNOSIS — E1142 Type 2 diabetes mellitus with diabetic polyneuropathy: Secondary | ICD-10-CM | POA: Diagnosis not present

## 2020-12-29 LAB — POCT GLYCOSYLATED HEMOGLOBIN (HGB A1C): Hemoglobin A1C: 6.9 % — AB (ref 4.0–5.6)

## 2020-12-29 MED ORDER — GLIMEPIRIDE 1 MG PO TABS: 1.0000 mg | ORAL_TABLET | Freq: Every day | ORAL | 1 refills | Status: DC

## 2020-12-29 MED ORDER — ESCITALOPRAM OXALATE 20 MG PO TABS: 20.0000 mg | ORAL_TABLET | Freq: Every day | ORAL | 1 refills | Status: DC

## 2020-12-29 MED ORDER — HYDROCODONE-ACETAMINOPHEN 5-325 MG PO TABS: 1.0000 | ORAL_TABLET | Freq: Three times a day (TID) | ORAL | 0 refills | Status: DC | PRN

## 2020-12-29 NOTE — Assessment & Plan Note (Signed)
Managed by cardiologist, recently d/c lisinopril d/t orthostatic hypotension. Reports home readings <150/90.  No changes today, pt will continue to monitor at home.

## 2020-12-29 NOTE — Assessment & Plan Note (Signed)
Chronic and stable, pt unsure if gabapentin is effective but wishes to continue.  Will refill hydrocodone.  Advised referral to pain management, pt declines at this time.

## 2020-12-29 NOTE — Assessment & Plan Note (Signed)
Chronic and well controlled Continue Lexapro at current dose.

## 2020-12-29 NOTE — Assessment & Plan Note (Addendum)
Restarting Glimepiride, A1C today 6.9%. Recheck 3 mo  Continue dietary and lifestyle changes. Pt will check sugar occasionally at home--advised if he feels dizzy/unwell to check sugar.

## 2020-12-29 NOTE — Progress Notes (Signed)
Established patient visit   Patient: Tony Williams   DOB: Apr 18, 1945   75 y.o. Male  MRN: [id number] Visit Date: 12/29/2020  Today's healthcare provider: Mikey Kirschner, PA-C   Chief Complaint  Patient presents with   Anxiety   Hypertension   Diabetes   Subjective    HPI  Diabetes Mellitus Type II, Follow-up  Lab Results  Component Value Date   HGBA1C 6.9 (A) 12/29/2020   HGBA1C 5.9 (A) 10/21/2020   HGBA1C 6.4 (H) 04/22/2020   Wt Readings from Last 3 Encounters:  12/29/20 262 lb 6.4 oz (119 kg)  12/26/20 260 lb (117.9 kg)  12/19/20 259 lb (117.5 kg)   Last seen for diabetes 2 months ago.  Management since then includes D/c glimepiride Continue dietary and lifestyle changes. He reports excellent compliance with treatment.  Reports continued severe b/l neuropathy which bothers him especially at night. Takes hyodrocodone for pain, one during the day and two at night.   Symptoms: No fatigue No foot ulcerations  No appetite changes No nausea  Yes paresthesia of the feet  No polydipsia  No polyuria No visual disturbances   No vomiting     Home blood sugar records:  not being checked  Episodes of hypoglycemia? No    Current insulin regiment: none Most Recent Eye Exam: >1 year Current exercise: no regular exercise Current diet habits: well balanced  Pertinent Labs: Lab Results  Component Value Date   CHOL 123 11/09/2020   HDL 32 (L) 11/09/2020   LDLCALC 47 11/09/2020   TRIG 219 (H) 11/09/2020   CHOLHDL 3.8 11/09/2020   Lab Results  Component Value Date   NA 136 12/09/2020   K 4.4 12/09/2020   CREATININE 2.47 (H) 12/09/2020   EGFR 34 (L) 11/27/2020   GFRNONAA 27 (L) 12/09/2020   GLUCOSE 147 (H) 12/09/2020     ---------------------------------------------------------------------------------------------------  Hypertension, follow-up  BP Readings from Last 3 Encounters:  12/29/20 139/68  12/26/20 (!) 106/50  12/19/20 (!) 93/59   Wt  Readings from Last 3 Encounters:  12/29/20 262 lb 6.4 oz (119 kg)  12/26/20 260 lb (117.9 kg)  12/19/20 259 lb (117.5 kg)     He was last seen for hypertension 2 months ago.  BP at that visit was 136/55.  Patient states that Friday cardiologist advised him to d/c Lisinopril d/t episodes of orthostatic hypotension. Reports home BP ranging from 130-140/60-80. Denies dizziness since d/c Lisinopril. He is following a Regular diet. He is not exercising. He does not smoke.  Use of agents associated with hypertension: NSAIDS.   Outside blood pressures are not being checked. Symptoms: No chest pain No chest pressure  No palpitations No syncope  No dyspnea No orthopnea  No paroxysmal nocturnal dyspnea No lower extremity edema   Pertinent labs: Lab Results  Component Value Date   CHOL 123 11/09/2020   HDL 32 (L) 11/09/2020   LDLCALC 47 11/09/2020   TRIG 219 (H) 11/09/2020   CHOLHDL 3.8 11/09/2020   Lab Results  Component Value Date   NA 136 12/09/2020   K 4.4 12/09/2020   CREATININE 2.47 (H) 12/09/2020   EGFR 34 (L) 11/27/2020   GFRNONAA 27 (L) 12/09/2020   GLUCOSE 147 (H) 12/09/2020     The ASCVD Risk score (Arnett DK, et al., 2019) failed to calculate for the following reasons:   The patient has a prior MI or stroke diagnosis   ---------------------------------------------------------------------------------------------------  Anxiety, Follow-up  He was last seen for  anxiety 2 months ago.   He reports good compliance with treatment. He reports excellent tolerance of treatment. He is not having side effects.  He feels his anxiety is moderate and Unchanged since last visit.  Symptoms: No chest pain No difficulty concentrating  Yes dizziness Yes fatigue  No feelings of losing control No insomnia  No irritable No palpitations  No panic attacks No racing thoughts  No shortness of breath No sweating  No tremors/shakes    GAD-7 Results No flowsheet data found.  PHQ-9  Scores PHQ9 SCORE ONLY 10/21/2020 05/13/2020  PHQ-9 Total Score 2 0    ---------------------------------------------------------------------------------------------------     Medications: Outpatient Medications Prior to Visit  Medication Sig   albuterol (VENTOLIN HFA) 108 (90 Base) MCG/ACT inhaler Inhale 2 puffs into the lungs every 6 (six) hours as needed for wheezing or shortness of breath.   allopurinol (ZYLOPRIM) 300 MG tablet Take 1 tablet (300 mg total) by mouth daily.   aspirin EC 81 MG tablet Take 81 mg by mouth daily. Swallow whole.   blood glucose meter kit and supplies Dispense based on patient and insurance preference. Use up to four times daily as directed. (FOR ICD-10 E10.9, E11.9). check blood glucose fasting and before meals four times daily.   Cholecalciferol (VITAMIN D3 PO) Take 4,000 Units by mouth.   Coenzyme Q10 (CO Q-10) 400 MG CAPS Take by mouth daily at 6 (six) AM.   desoximetasone (TOPICORT) 0.25 % cream Apply 1 application topically 2 (two) times daily.   fluticasone-salmeterol (ADVAIR HFA) 230-21 MCG/ACT inhaler Inhale 1-2 puffs into the lungs 2 (two) times daily. Rinse mouth after each use.   gabapentin (NEURONTIN) 300 MG capsule Take 2 capsules (600 mg total) by mouth at bedtime.   lovastatin (MEVACOR) 40 MG tablet Take 40 mg by mouth at bedtime.   nitroGLYCERIN (NITROSTAT) 0.4 MG SL tablet Place 1 tablet (0.4 mg total) under the tongue every 5 (five) minutes x 3 doses as needed for chest pain.   Omega-3 Fatty Acids (FISH OIL) 1200 MG CAPS Take by mouth daily at 6 (six) AM.   ticagrelor (BRILINTA) 90 MG TABS tablet Take 1 tablet (90 mg total) by mouth 2 (two) times daily.   torsemide (DEMADEX) 20 MG tablet Take 1 tablet (20 mg total) by mouth daily as needed.   traZODone (DESYREL) 150 MG tablet Take 1-2 tablets (150-300 mg total) by mouth at bedtime as needed for sleep.   [DISCONTINUED] escitalopram (LEXAPRO) 20 MG tablet Take 1 tablet (20 mg total) by mouth daily.    No facility-administered medications prior to visit.    Review of Systems  Constitutional: Negative.   Respiratory: Negative.    Cardiovascular: Negative.   Musculoskeletal:  Positive for arthralgias and gait problem.      Objective    BP 139/68   Pulse 64   Temp 97.9 F (36.6 C) (Oral)   Resp 16   Wt 262 lb 6.4 oz (119 kg)   SpO2 93%   BMI 38.75 kg/m    Physical Exam Constitutional:      General: He is awake.     Appearance: He is well-developed.  HENT:     Head: Normocephalic.  Eyes:     Conjunctiva/sclera: Conjunctivae normal.  Cardiovascular:     Rate and Rhythm: Normal rate and regular rhythm.     Heart sounds: Normal heart sounds.  Pulmonary:     Effort: Pulmonary effort is normal.     Breath sounds: Normal breath  sounds.  Musculoskeletal:     Comments: B/l extremities warm to touch, no edema or visible wounds. 3+ b/l DP pulses  Skin:    General: Skin is warm.  Neurological:     Mental Status: He is alert and oriented to person, place, and time.  Psychiatric:        Attention and Perception: Attention normal.        Mood and Affect: Mood normal.        Speech: Speech normal.        Behavior: Behavior is cooperative.     Results for orders placed or performed in visit on 12/29/20  POCT glycosylated hemoglobin (Hb A1C)  Result Value Ref Range   Hemoglobin A1C 6.9 (A) 4.0 - 5.6 %   HbA1c POC (<> result, manual entry)     HbA1c, POC (prediabetic range)     HbA1c, POC (controlled diabetic range)      Assessment & Plan    Hypertension associated with diabetes ([place]) Managed by cardiologist, recently d/c lisinopril d/t orthostatic hypotension. Reports home readings <150/90.  No changes today, pt will continue to monitor at home.   Controlled type 2 diabetes mellitus with diabetic polyneuropathy, without long-term current use of insulin (HCC) Restarting Glimepiride, A1C today 6.9%. Recheck 3 mo  Continue dietary and lifestyle changes. Pt will check  sugar occasionally at home--advised if he feels dizzy/unwell to check sugar.   GAD (generalized anxiety disorder) Chronic and well controlled Continue Lexapro at current dose.  Diabetic polyneuropathy associated with type 2 diabetes mellitus (HCC) Chronic and stable, pt unsure if gabapentin is effective but wishes to continue.  Will refill hydrocodone.  Advised referral to pain management, pt declines at this time.    I, Mikey Kirschner, PA-C have reviewed all documentation for this visit. The documentation on 12/29/2020 or the exam, diagnosis, procedures, and orders are all accurate and complete.    Mikey Kirschner, PA-C  [facility] [phone] (phone) [phone] (fax)  [place]

## 2020-12-30 ENCOUNTER — Other Ambulatory Visit: Payer: Self-pay | Admitting: Physician Assistant

## 2020-12-30 DIAGNOSIS — E1142 Type 2 diabetes mellitus with diabetic polyneuropathy: Secondary | ICD-10-CM

## 2020-12-30 DIAGNOSIS — F411 Generalized anxiety disorder: Secondary | ICD-10-CM

## 2020-12-30 MED ORDER — ESCITALOPRAM OXALATE 20 MG PO TABS: 20.0000 mg | ORAL_TABLET | Freq: Every day | ORAL | 1 refills | Status: DC

## 2020-12-30 MED ORDER — GLIMEPIRIDE 1 MG PO TABS: 1.0000 mg | ORAL_TABLET | Freq: Every day | ORAL | 1 refills | Status: DC

## 2020-12-30 NOTE — Progress Notes (Signed)
Re ordering meds d/t e prescribe issue

## 2021-01-02 ENCOUNTER — Ambulatory Visit: Payer: Medicare Other | Admitting: Cardiology

## 2021-01-13 ENCOUNTER — Other Ambulatory Visit: Payer: Self-pay | Admitting: Family Medicine

## 2021-01-13 NOTE — Telephone Encounter (Signed)
Requested Prescriptions  Pending Prescriptions Disp Refills  . traZODone (DESYREL) 150 MG tablet [Pharmacy Med Name: TRAZODONE 150MG  (HUNDRED-FIFTY) TAB] 180 tablet 1    Sig: TAKE 1 TO 2 TABLETS(150 TO 300 MG) BY MOUTH AT BEDTIME AS NEEDED FOR SLEEP     Psychiatry: Antidepressants - Serotonin Modulator Passed - 01/13/2021 12:11 PM      Passed - Valid encounter within last 6 months    Recent Outpatient Visits          2 weeks ago Controlled type 2 diabetes mellitus with diabetic polyneuropathy, without long-term current use of insulin ([place])   [facility] Mikey Kirschner, PA-C   4 weeks ago Hospital discharge follow-up   [facility] Tally Joe T, FNP   2 months ago Controlled type 2 diabetes mellitus with diabetic polyneuropathy, without long-term current use of insulin [facility])   [facility], Dionne Bucy, MD   5 months ago Encounter for annual physical exam   [facility], Dionne Bucy, MD   8 months ago Sinus congestion   [facility] Trinna Post, [place]      Future Appointments            In 1 week Agbor-Etang, Aaron Edelman, MD [facility], [facility]   In 2 weeks Brita Romp, Dionne Bucy, MD [facility], [place]   In 2 months Drubel, Ria Comment, PA-C [facility], [facility]           . gabapentin (NEURONTIN) 300 MG capsule [Pharmacy Med Name: GABAPENTIN 300MG  CAPSULES] 180 capsule 1    Sig: TAKE 2 CAPSULES(600 MG) BY MOUTH AT BEDTIME     Neurology: Anticonvulsants - gabapentin Passed - 01/13/2021 12:11 PM      Passed - Valid encounter within last 12 months    Recent Outpatient Visits          2 weeks ago Controlled type 2 diabetes mellitus with diabetic polyneuropathy, without long-term current use of insulin ([place])   [facility] Mikey Kirschner, PA-C   4 weeks ago Hospital discharge follow-up   [facility] Tally Joe T, FNP   2  months ago Controlled type 2 diabetes mellitus with diabetic polyneuropathy, without long-term current use of insulin [facility])   [facility], Dionne Bucy, MD   5 months ago Encounter for annual physical exam   [facility] [place], Dionne Bucy, MD   8 months ago Sinus congestion   [facility] Trinna Post, [place]      Future Appointments            In 1 week Agbor-Etang, Aaron Edelman, MD [facility], [facility]   In 2 weeks Brita Romp, Dionne Bucy, MD [facility], [place]   In 2 months Thedore Mins, Ria Comment, PA-C [employer], [place]

## 2021-01-19 ENCOUNTER — Other Ambulatory Visit: Payer: Self-pay

## 2021-01-19 ENCOUNTER — Telehealth: Payer: Self-pay | Admitting: Cardiology

## 2021-01-19 ENCOUNTER — Encounter: Payer: Self-pay | Admitting: Cardiology

## 2021-01-19 ENCOUNTER — Ambulatory Visit: Payer: Medicare Other | Admitting: Cardiology

## 2021-01-19 VITALS — BP 148/62 | HR 65 | Ht 69.0 in | Wt 264.0 lb

## 2021-01-19 DIAGNOSIS — J984 Other disorders of lung: Secondary | ICD-10-CM

## 2021-01-19 DIAGNOSIS — I951 Orthostatic hypotension: Secondary | ICD-10-CM

## 2021-01-19 DIAGNOSIS — I2510 Atherosclerotic heart disease of native coronary artery without angina pectoris: Secondary | ICD-10-CM | POA: Diagnosis not present

## 2021-01-19 DIAGNOSIS — E7800 Pure hypercholesterolemia, unspecified: Secondary | ICD-10-CM | POA: Diagnosis not present

## 2021-01-19 DIAGNOSIS — R0683 Snoring: Secondary | ICD-10-CM

## 2021-01-19 DIAGNOSIS — Z6838 Body mass index (BMI) 38.0-38.9, adult: Secondary | ICD-10-CM | POA: Diagnosis not present

## 2021-01-19 NOTE — Patient Instructions (Signed)
Medication Instructions:  Your physician recommends that you continue on your current medications as directed. Please refer to the Current Medication list given to you today.  *If you need a refill on your cardiac medications before your next appointment, please call your pharmacy*   Lab Work: None Ordered If you have labs (blood work) drawn today and your tests are completely normal, you will receive your results only by: [place] (if you have MyChart) OR A paper copy in the mail If you have any lab test that is abnormal or we need to change your treatment, we will call you to review the results.   Testing/Procedures: None Ordered   Follow-Up: At [facility], you and your health needs are our priority.  As part of our continuing mission to provide you with exceptional heart care, we have created designated Provider Care Teams.  These Care Teams include your primary Cardiologist (physician) and Advanced Practice Providers (APPs -  Physician Assistants and Nurse Practitioners) who all work together to provide you with the care you need, when you need it.  We recommend signing up for the patient portal called "MyChart".  Sign up information is provided on this After Visit Summary.  MyChart is used to connect with patients for Virtual Visits (Telemedicine).  Patients are able to view lab/test results, encounter notes, upcoming appointments, etc.  Non-urgent messages can be sent to your provider as well.   To learn more about what you can do with MyChart, go to [email or web address].    Your next appointment:   3 month(s)  The format for your next appointment:   In Person  Provider:   You may see Kate Sable, MD or one of the following Advanced Practice Providers on your designated Care Team:   Murray Hodgkins, NP Christell Faith, PA-C [facility] Kathlen Mody, [place]    Other Instructions

## 2021-01-19 NOTE — Progress Notes (Signed)
Cardiology Office Note:    Date:  01/19/2021   ID:  Tony Williams, DOB October 08, 1945, MRN [id number]  PCP:  Tony Williams, Tony Williams  Cardiologist:  Tony Sable, MD  Advanced Practice Provider:  No care team member to display Electrophysiologist:  None       Referring MD: Tony Crews, MD   Chief Complaint  Patient presents with   Other    Patient c.o BP going up and down and Very Fatigued. Patient stated that Saturday he had chest pain and arm pain. Meds reviewed verbally with patient.      History of Present Illness:    Tony Williams is a 76 y.o. male with a hx of CAD/CABG x 4 2007, PCI x 2 2018, PCI x 1 SVG-LAD/Diagonal 2022, former smoker, possible COPD, OSA, obesity diabetes who presents for follow-up.  Previously seen due to dizziness deemed secondary to orthostasis.  Lisinopril was stopped, he was advised to take torsemide only as as needed.  Had 1 episode of dizziness over the past week.  Otherwise he feels fatigued, out of breath most of the time.  Endorsed snoring, wife has noticed occasional apneic episodes.      Prior notes Limited echo 11/2020 EF 60 to 65%, indeterminate diastolic function Echo 06/979 normal systolic function, indeterminate diastolic function, EF 60 to 65%. Lexiscan Myoview, fixed perfusion defect, no evidence for ischemia. Patient recently moved to the area from [place].  States having CAD/CABG x4 back in 2007, in 2018 had a myocardial infarction, had 2 stents placed.  He is intolerant to statins, has tried multiple statins and not able to tolerate due to myalgias.  Currently able to tolerate low-dose lovastatin.    Had sleep study in the past and was diagnosed with OSA, he declines using his CPAP mask.  Does not like the feel for the mask.    Past Medical History:  Diagnosis Date   CAD S/P percutaneous coronary angioplasty    CAD, multiple vessel    Cancer ([place]) 2013    Bladder   Chronic kidney disease    COPD (chronic obstructive pulmonary disease) (HCC)    Depression    Diabetes mellitus without complication (HCC)    Heart problem    Hx of CABG x 4    LIMA-D2, SVG-OM (occluded), SeqSVG-D1-LAD.   Myocardial infarction [facility])     Past Surgical History:  Procedure Laterality Date   CHOLECYSTECTOMY, LAPAROSCOPIC  12/26/1995   COLONOSCOPY WITH PROPOFOL N/A 09/23/2020   Procedure: COLONOSCOPY WITH PROPOFOL;  Surgeon: Lucilla Lame, MD;  Location: [facility] ENDOSCOPY;  Service: Endoscopy;  Laterality: N/A;   CORONARY ARTERY BYPASS GRAFT     LIMA-D2, SVG-OM, SeqSVG-D1-LAD   CORONARY STENT INTERVENTION N/A 11/12/2020   Procedure: CORONARY STENT INTERVENTION;  Surgeon: Wellington Hampshire, MD;  Location: [place] CV LAB;  Service: Cardiovascular;  Laterality: N/A;   EYE SURGERY     IABP INSERTION N/A 11/11/2020   Procedure: IABP Insertion;  Surgeon: Nelva Bush, MD;  Location: [place] CV LAB;  Service: Cardiovascular;  Laterality: N/A;   IR STENT PLACEMENT ANTE CAROTID INC ANGIO     KNEE SURGERY     RIGHT/LEFT HEART CATH AND CORONARY/GRAFT ANGIOGRAPHY N/A 11/11/2020   Procedure: RIGHT/LEFT HEART CATH AND CORONARY/GRAFT ANGIOGRAPHY;  Surgeon: Nelva Bush, MD;  Location: [place] CV LAB;  Service: Cardiovascular;  Severe native CAD: 90% dLMCA-100% mLAD/100% ost LCx CTO w/ Severe diffuse RCA disease. Widely patent  LIMA-D1. Patent sequential SVG-D2-LAD with 99% mid graft & 90% LIMA-D2 @ anastomosis. previously stented SVG-OM - CTO. Mildly   TRANSURETHRAL RESECTION OF PROSTATE  1994   URINARY SPHINCTER IMPLANT  1995    Current Medications: Current Meds  Medication Sig   albuterol (VENTOLIN HFA) 108 (90 Base) MCG/ACT inhaler Inhale 2 puffs into the lungs every 6 (six) hours as needed for wheezing or shortness of breath.   allopurinol (ZYLOPRIM) 300 MG tablet Take 1 tablet (300 mg total) by mouth daily.   aspirin EC 81 MG tablet Take 81 mg by  mouth daily. Swallow whole.   blood glucose meter kit and supplies Dispense based on patient and insurance preference. Use up to four times daily as directed. (FOR ICD-10 E10.9, E11.9). check blood glucose fasting and before meals four times daily.   Cholecalciferol (VITAMIN D3 PO) Take 4,000 Units by mouth.   Coenzyme Q10 (CO Q-10) 400 MG CAPS Take by mouth daily at 6 (six) AM.   desoximetasone (TOPICORT) 0.25 % cream Apply 1 application topically 2 (two) times daily.   escitalopram (LEXAPRO) 20 MG tablet Take 1 tablet (20 mg total) by mouth daily.   fluticasone-salmeterol (ADVAIR HFA) 230-21 MCG/ACT inhaler Inhale 1-2 puffs into the lungs 2 (two) times daily. Rinse mouth after each use.   gabapentin (NEURONTIN) 300 MG capsule TAKE 2 CAPSULES(600 MG) BY MOUTH AT BEDTIME   glimepiride (AMARYL) 1 MG tablet Take 1 tablet (1 mg total) by mouth daily with breakfast.   HYDROcodone-acetaminophen (NORCO) 5-325 MG tablet Take 1 tablet by mouth 3 (three) times daily as needed for moderate pain.   lovastatin (MEVACOR) 40 MG tablet Take 40 mg by mouth at bedtime.   nitroGLYCERIN (NITROSTAT) 0.4 MG SL tablet Place 1 tablet (0.4 mg total) under the tongue every 5 (five) minutes x 3 doses as needed for chest pain.   Omega-3 Fatty Acids (FISH OIL) 1200 MG CAPS Take by mouth daily at 6 (six) AM.   ticagrelor (BRILINTA) 90 MG TABS tablet Take 1 tablet (90 mg total) by mouth 2 (two) times daily.   torsemide (DEMADEX) 20 MG tablet Take 1 tablet (20 mg total) by mouth daily as needed.   traZODone (DESYREL) 150 MG tablet TAKE 1 TO 2 TABLETS(150 TO 300 MG) BY MOUTH AT BEDTIME AS NEEDED FOR SLEEP     Allergies:   Butorphanol, Morphine, Pedi-pre tape spray [wound dressing adhesive], Simvastatin, and Statins   Social History   Socioeconomic History   Marital status: Married    Spouse name: Not on file   Number of children: 2   Years of education: Not on file   Highest education level: High school graduate   Occupational History   Occupation: retired  Tobacco Use   Smoking status: Former    Types: Cigarettes    Quit date: 10/23/2003    Years since quitting: 17.2   Smokeless tobacco: Never  Vaping Use   Vaping Use: Never used  Substance and Sexual Activity   Alcohol use: Never   Drug use: Never   Sexual activity: Not on file  Other Topics Concern   Not on file  Social History Narrative   Not on file   Social Determinants of Health   Financial Resource Strain: Low Risk    Difficulty of Paying Living Expenses: Not hard at all  Food Insecurity: No Food Insecurity   Worried About Charity fundraiser in the Last Year: Never true   Arboriculturist in  the Last Year: Never true  Transportation Needs: No Transportation Needs   Lack of Transportation (Medical): No   Lack of Transportation (Non-Medical): No  Physical Activity: Inactive   Days of Exercise per Week: 0 days   Minutes of Exercise per Session: 0 min  Stress: No Stress Concern Present   Feeling of Stress : Not at all  Social Connections: Moderately Isolated   Frequency of Communication with Friends and Family: More than three times a week   Frequency of Social Gatherings with Friends and Family: Three times a week   Attends Religious Services: Never   Active Member of Clubs or Organizations: No   Attends Archivist Meetings: Never   Marital Status: Married     Family History: The patient's family history includes Heart Problems in his father and mother.  ROS:   Please see the history of present illness.     All other systems reviewed and are negative.  EKGs/Labs/Other Studies Reviewed:    The following studies were reviewed today:   EKG:  EKG is  ordered today.  EKG shows sinus rhythm, heart rate 65  Recent Labs: 12/08/2020: ALT 13; B Natriuretic Peptide 142.7; Magnesium 2.4; TSH 2.515 12/09/2020: BUN 49; Creatinine, Ser 2.47; Hemoglobin 12.9; Platelets 195; Potassium 4.4; Sodium 136  Recent Lipid  Panel    Component Value Date/Time   CHOL 123 11/09/2020 0436   CHOL 156 07/21/2020 1440   TRIG 219 (H) 11/09/2020 0436   HDL 32 (L) 11/09/2020 0436   HDL 34 (L) 07/21/2020 1440   CHOLHDL 3.8 11/09/2020 0436   VLDL 44 (H) 11/09/2020 0436   LDLCALC 47 11/09/2020 0436   LDLCALC 84 07/21/2020 1440     Risk Assessment/Calculations:      Physical Exam:    VS:  BP (!) 148/62 (BP Location: Left Arm, Patient Position: Sitting, Cuff Size: Normal)   Pulse 65   Ht 5' 9"  (1.753 m)   Wt 264 lb (119.7 kg)   SpO2 95%   BMI 38.99 kg/m     Wt Readings from Last 3 Encounters:  01/19/21 264 lb (119.7 kg)  12/29/20 262 lb 6.4 oz (119 kg)  12/26/20 260 lb (117.9 kg)     GEN:  Well nourished, well developed in no acute distress HEENT: Normal NECK: No JVD; No carotid bruits LYMPHATICS: No lymphadenopathy CARDIAC: RRR, no murmurs, rubs, gallops RESPIRATORY: Diminished breath sounds bilaterally, worse on the right lung ABDOMEN: Soft, non-tender, distended MUSCULOSKELETAL:  No edema; No deformity  SKIN: Warm and dry NEUROLOGIC:  Alert and oriented x 3 PSYCHIATRIC:  Normal affect   ASSESSMENT:    1. Orthostatic hypotension   2. Coronary artery disease involving native coronary artery of native heart, unspecified whether angina present   3. Pure hypercholesterolemia   4. BMI 38.0-38.9,adult   5. Snoring   6. Pulmonary scarring      PLAN:    In order of problems listed above:  Orthostatic hypotension, BP improved.  Continue torsemide only as as needed.  We will avoid midodrine to prevent supine hypertension. History of CABG x4, PCI x2.  Recent PCI x2 to SVG.  Last echo with EF 60%.  Continue aspirin, lovastatin. Hyperlipidemia, low-dose lovastatin,Not tolerant to higher doses of statin. Obesity, low-calorie diet, weight loss advised. Snoring, daytime fatigue, referral to pulmonary medicine for OSA eval and treatment. Pulmonary scarring, right lung volume loss, referred to  pulmonary medicine.  Follow-up in 3 months.   Medication Adjustments/Labs and Tests Ordered: Current  medicines are reviewed at length with the patient today.  Concerns regarding medicines are outlined above.  Orders Placed This Encounter  Procedures   Ambulatory referral to Pulmonology   EKG 12-Lead       No orders of the defined types were placed in this encounter.     Patient Instructions  Medication Instructions:  Your physician recommends that you continue on your current medications as directed. Please refer to the Current Medication list given to you today.  *If you need a refill on your cardiac medications before your next appointment, please call your pharmacy*   Lab Work: None Ordered If you have labs (blood work) drawn today and your tests are completely normal, you will receive your results only by: [place] (if you have MyChart) OR A paper copy in the mail If you have any lab test that is abnormal or we need to change your treatment, we will call you to review the results.   Testing/Procedures: None Ordered   Follow-Up: At [facility], you and your health needs are our priority.  As part of our continuing mission to provide you with exceptional heart care, we have created designated Provider Care Teams.  These Care Teams include your primary Cardiologist (physician) and Advanced Practice Providers (APPs -  Physician Assistants and Nurse Practitioners) who all work together to provide you with the care you need, when you need it.  We recommend signing up for the patient portal called "MyChart".  Sign up information is provided on this After Visit Summary.  MyChart is used to connect with patients for Virtual Visits (Telemedicine).  Patients are able to view lab/test results, encounter notes, upcoming appointments, etc.  Non-urgent messages can be sent to your provider as well.   To learn more about what you can do with MyChart, go to  [email or web address].    Your next appointment:   3 month(s)  The format for your next appointment:   In Person  Provider:   You may see Tony Sable, MD or one of the following Advanced Practice Providers on your designated Care Team:   Murray Hodgkins, NP Christell Faith, PA-C Cadence Kathlen Mody, [place]    Other Instructions     Signed, Tony Sable, MD  01/19/2021 5:14 PM    [place]

## 2021-01-19 NOTE — Telephone Encounter (Signed)
Pt c/o BP issue: STAT if pt c/o blurred vision, one-sided weakness or slurred speech  1. What are your last 5 BP readings?  Saturday at 2 am 80/50, later was 100/630  after he got up it was 136/80  2. Are you having any other symptoms (ex. Dizziness, headache, blurred vision, passed out)? Pain in neck and left shoulder, dizziness, extremely tired, some chest pain  3. What is your BP issue? low   Pt c/o of Chest Pain: STAT if CP now or developed within 24 hours  1. Are you having CP right now? no  2. Are you experiencing any other symptoms (ex. SOB, nausea, vomiting, sweating)? Dizziness, low BP  3. How long have you been experiencing CP? About 30 minutes  4. Is your CP continuous or coming and going?  After he took nitroglycerin, it went away  5. Have you taken Nitroglycerin? On Saturday ?

## 2021-01-19 NOTE — Telephone Encounter (Signed)
Called patient and offered him our same day appointment time of 1:20. Patient was very grateful and confirmed that he will be here for the appointment.

## 2021-01-21 ENCOUNTER — Other Ambulatory Visit
Admission: RE | Admit: 2021-01-21 | Discharge: 2021-01-21 | Disposition: A | Discharge status: Home / Self Care | Payer: Medicare Other | Source: Ambulatory Visit | Attending: Pulmonary Disease | Admitting: Pulmonary Disease

## 2021-01-21 ENCOUNTER — Ambulatory Visit
Admission: RE | Admit: 2021-01-21 | Discharge: 2021-01-21 | Disposition: A | Discharge status: Home / Self Care | Payer: Medicare Other | Source: Ambulatory Visit | Attending: Pulmonary Disease | Admitting: Pulmonary Disease

## 2021-01-21 ENCOUNTER — Encounter: Payer: Self-pay | Admitting: Pulmonary Disease

## 2021-01-21 ENCOUNTER — Ambulatory Visit (INDEPENDENT_AMBULATORY_CARE_PROVIDER_SITE_OTHER): Payer: Medicare Other | Admitting: Pulmonary Disease

## 2021-01-21 ENCOUNTER — Other Ambulatory Visit: Payer: Self-pay

## 2021-01-21 ENCOUNTER — Ambulatory Visit
Admission: RE | Admit: 2021-01-21 | Discharge: 2021-01-21 | Disposition: A | Discharge status: Home / Self Care | Payer: Medicare Other | Attending: Pulmonary Disease | Admitting: Pulmonary Disease

## 2021-01-21 VITALS — BP 130/80 | HR 59 | Temp 97.3°F | Ht 69.0 in | Wt 264.6 lb

## 2021-01-21 DIAGNOSIS — R0609 Other forms of dyspnea: Secondary | ICD-10-CM

## 2021-01-21 DIAGNOSIS — R0683 Snoring: Secondary | ICD-10-CM | POA: Diagnosis not present

## 2021-01-21 DIAGNOSIS — Z20822 Contact with and (suspected) exposure to covid-19: Secondary | ICD-10-CM

## 2021-01-21 DIAGNOSIS — R0600 Dyspnea, unspecified: Secondary | ICD-10-CM | POA: Diagnosis not present

## 2021-01-21 NOTE — Patient Instructions (Signed)
Chest xray today  Will arrange for home sleep study and pulmonary function test  Follow up in 4 to 6 weeks with Dr. Halford Chessman or Nurse Practitioner after your pulmonary function test is completed

## 2021-01-21 NOTE — Progress Notes (Signed)
[facility]  Chief Complaint  Patient presents with   Consult    Sleep consult-    Past Surgical History:  He  has a past surgical history that includes Eye surgery; Coronary artery bypass graft; IR STENT PLACEMENT ANTE CAROTID INC ANGIO; Knee surgery; Colonoscopy with propofol (N/A, 09/23/2020); Transurethral resection of prostate (1994); Urinary sphincter implant (1995); Cholecystectomy, laparoscopic (12/26/1995); RIGHT/LEFT HEART CATH AND CORONARY/GRAFT ANGIOGRAPHY (N/A, 11/11/2020); IABP Insertion (N/A, 11/11/2020); and CORONARY STENT INTERVENTION (N/A, 11/12/2020).  Past Medical History:  CAD s/p CABG, Bladder cancer, CKD 4, COPD, Depression, DM type 2  Constitutional:  BP 130/80 (BP Location: Left Arm, Patient Position: Sitting, Cuff Size: Normal)   Pulse (!) 59   Temp (!) 97.3 F (36.3 C) (Oral)   Ht 5' 9"  (1.753 m)   Wt 264 lb 9.6 oz (120 kg)   SpO2 93%   BMI 39.07 kg/m   Brief Summary:  Tony Williams is a 75 y.o. male former smoker with snoring and dyspnea.      Subjective:   He is here with his wife.  He is followed by cardiology.  He has progressive dyspnea at rest and with exertion.  He feels this started after he had heart catheterization with stenting.  He gets winded after walking about 25 feet.  He has intermittent cough.  Not having wheeze, sputum, or hemoptysis.  Not having chest pain or palpitations.  Has been using advair and albuterol, but doesn't feel like these help.  He had CT chest when in hospital in August.  Showed Rt fibrothorax with volume loss.  Says he had pleural effusion after having cholecystitis, but was told intervention at the time would be too risky.  He smoked 1.5 ppd and quit about 25 years ago.  He snores and his wife says he stops breathing at night.  He is a restless sleeper.  He wakes up 1 or 2 times to use the bathroom.  He goes to bed at 1 am, but doesn't fall asleep until 230 am.  He  gets out of bed at 10 am.  He is not using anything to help stay awake.  Epworth score is 3 out of 24.  Labs from 12/09/20: WBC 10.5, Hb 12.9, PLT 195, Na 136, K 4.4, CO2 25, Creatinine 2.47, Ca 8.6  Ambulatory oximetry on room air today: maintained SpO2 > 92%, HR from 60 to 95.  Was only able to complete 2 laps - stopped due to feeling short of breath.  Physical Exam:   Appearance - well kempt, uses a can  ENMT - no sinus tenderness, no oral exudate, no LAN, Mallampati 3 airway, no stridor  Respiratory - equal breath sounds bilaterally, no wheezing or rales  CV - s1s2 regular rate and rhythm, no murmurs  Ext - no clubbing, no edema  Skin - no rashes  Psych - normal mood and affect   Pulmonary testing:    Chest Imaging:  CT angio chest 11/08/20 >> fibrothorax on Rt with volume loss  Sleep Tests:    Cardiac Tests:  Echo 11/16/20 >> EF 60 to 65%  Social History:  He  reports that he quit smoking about 17 years ago. His smoking use included cigarettes. He has never used smokeless tobacco. He reports that he does not drink alcohol and does not use drugs.  Family History:  His family history includes Heart Problems in his father and mother.    Discussion:  He has  snoring, sleep disruption, apnea, and daytime sleepiness.  He has history of CAD, and depression.  His BMI is > 35.  I am concerned he could have obstructive sleep apnea.  He has progressive dyspnea at rest and with exertion.  He reports this has progressed since he had cardiac catheterization with stenting in August 2022.  He has prior history of smoking and carries a diagnosis of COPD, but hasn't noticed symptomatic benefit from inhaler therapy.  He has history of right pleural effusion with fibrothorax and trapped lung.  He is obese and has sedentary life style, and likely has a component of deconditioning contributing to his dyspnea.  Assessment/Plan:   Snoring with excessive daytime sleepiness. - will need to  arrange for a home sleep study  Dyspnea on exertion and at rest. - will arrange for chest xray and pulmonary function test - he can continue advair and albuterol for now; defer medication adjustments until after tests reviewed  CAD s/p CABG. - followed by Dr. Kate Sable with [facility] in [facility]  Obesity. - discussed how weight can impact sleep and risk for sleep disordered breathing - discussed options to assist with weight loss: combination of diet modification, cardiovascular and strength training exercises  Cardiovascular risk. - had an extensive discussion regarding the adverse health consequences related to untreated sleep disordered breathing - specifically discussed the risks for hypertension, coronary artery disease, cardiac dysrhythmias, cerebrovascular disease, and diabetes - lifestyle modification discussed  Safe driving practices. - discussed how sleep disruption can increase risk of accidents, particularly when driving - safe driving practices were discussed  Therapies for obstructive sleep apnea. - if the sleep study shows significant sleep apnea, then various therapies for treatment were reviewed: CPAP, oral appliance, and surgical interventions  Time Spent Involved in Patient Care on Day of Examination:  48 minutes  Follow up:   Patient Instructions  Chest xray today  Will arrange for home sleep study and pulmonary function test  Follow up in 4 to 6 weeks with Dr. Halford Chessman or Nurse Practitioner after your pulmonary function test is completed  Medication List:   Allergies as of 01/21/2021       Reactions   Butorphanol Itching   Morphine Other (See Comments)   Redness around IV site   Pedi-pre Tape Spray [wound Dressing Adhesive] Itching   Simvastatin Other (See Comments)   "Destroyed my muscles"   Statins Other (See Comments)   "Destroyed my muscles"        Medication List        Accurate as of January 21, 2021 11:09 AM. If you have  any questions, ask your nurse or doctor.          Advair HFA 230-21 MCG/ACT inhaler Generic drug: fluticasone-salmeterol Inhale 1-2 puffs into the lungs 2 (two) times daily. Rinse mouth after each use.   albuterol 108 (90 Base) MCG/ACT inhaler Commonly known as: VENTOLIN HFA Inhale 2 puffs into the lungs every 6 (six) hours as needed for wheezing or shortness of breath.   allopurinol 300 MG tablet Commonly known as: ZYLOPRIM Take 1 tablet (300 mg total) by mouth daily.   aspirin EC 81 MG tablet Take 81 mg by mouth daily. Swallow whole.   blood glucose meter kit and supplies Dispense based on patient and insurance preference. Use up to four times daily as directed. (FOR ICD-10 E10.9, E11.9). check blood glucose fasting and before meals four times daily.   Co Q-10 400 MG Caps Take by  mouth daily at 6 (six) AM.   desoximetasone 0.25 % cream Commonly known as: TOPICORT Apply 1 application topically 2 (two) times daily.   escitalopram 20 MG tablet Commonly known as: LEXAPRO Take 1 tablet (20 mg total) by mouth daily.   Fish Oil 1200 MG Caps Take by mouth daily at 6 (six) AM.   gabapentin 300 MG capsule Commonly known as: NEURONTIN TAKE 2 CAPSULES(600 MG) BY MOUTH AT BEDTIME   glimepiride 1 MG tablet Commonly known as: AMARYL Take 1 tablet (1 mg total) by mouth daily with breakfast.   HYDROcodone-acetaminophen 5-325 MG tablet Commonly known as: Norco Take 1 tablet by mouth 3 (three) times daily as needed for moderate pain.   lovastatin 40 MG tablet Commonly known as: MEVACOR Take 40 mg by mouth at bedtime.   nitroGLYCERIN 0.4 MG SL tablet Commonly known as: NITROSTAT Place 1 tablet (0.4 mg total) under the tongue every 5 (five) minutes x 3 doses as needed for chest pain.   ticagrelor 90 MG Tabs tablet Commonly known as: BRILINTA Take 1 tablet (90 mg total) by mouth 2 (two) times daily.   torsemide 20 MG tablet Commonly known as: DEMADEX Take 1 tablet (20 mg  total) by mouth daily as needed.   traZODone 150 MG tablet Commonly known as: DESYREL TAKE 1 TO 2 TABLETS(150 TO 300 MG) BY MOUTH AT BEDTIME AS NEEDED FOR SLEEP   VITAMIN D3 PO Take 4,000 Units by mouth.        Signature:  Chesley Mires, MD [place] Pager - [phone] 01/21/2021, 11:09 AM

## 2021-01-22 ENCOUNTER — Ambulatory Visit: Payer: Medicare Other | Attending: Pulmonary Disease

## 2021-01-22 DIAGNOSIS — R0609 Other forms of dyspnea: Secondary | ICD-10-CM | POA: Diagnosis not present

## 2021-01-22 DIAGNOSIS — Z87891 Personal history of nicotine dependence: Secondary | ICD-10-CM | POA: Diagnosis not present

## 2021-01-22 LAB — PULMONARY FUNCTION TEST ARMC ONLY
DL/VA % pred: 103 %
DL/VA: 4.14 ml/min/mmHg/L
DLCO unc % pred: 54 %
DLCO unc: 13.43 ml/min/mmHg
FEF 25-75 Post: 0.48 L/sec
FEF 25-75 Pre: 1.19 L/sec
FEF2575-%Change-Post: -59 %
FEF2575-%Pred-Post: 21 %
FEF2575-%Pred-Pre: 54 %
FEV1-%Change-Post: -6 %
FEV1-%Pred-Post: 48 %
FEV1-%Pred-Pre: 51 %
FEV1-Post: 1.44 L
FEV1-Pre: 1.54 L
FEV1FVC-%Change-Post: 10 %
FEV1FVC-%Pred-Pre: 102 %
FEV6-%Change-Post: -15 %
FEV6-%Pred-Post: 45 %
FEV6-%Pred-Pre: 53 %
FEV6-Post: 1.74 L
FEV6-Pre: 2.05 L
FEV6FVC-%Pred-Post: 106 %
FEV6FVC-%Pred-Pre: 106 %
FVC-%Change-Post: -15 %
FVC-%Pred-Post: 42 %
FVC-%Pred-Pre: 49 %
FVC-Post: 1.74 L
FVC-Pre: 2.05 L
Post FEV1/FVC ratio: 83 %
Post FEV6/FVC ratio: 100 %
Pre FEV1/FVC ratio: 75 %
Pre FEV6/FVC Ratio: 100 %
RV % pred: 72 %
RV: 1.79 L
TLC % pred: 50 %
TLC: 3.43 L

## 2021-01-22 LAB — SARS CORONAVIRUS 2 (TAT 6-24 HRS): SARS Coronavirus 2: NEGATIVE

## 2021-01-26 ENCOUNTER — Telehealth: Payer: Self-pay | Admitting: Pulmonary Disease

## 2021-01-26 ENCOUNTER — Ambulatory Visit: Payer: Medicare Other | Admitting: Cardiology

## 2021-01-26 ENCOUNTER — Other Ambulatory Visit
Admission: RE | Admit: 2021-01-26 | Discharge: 2021-01-26 | Disposition: A | Discharge status: Home / Self Care | Payer: Medicare Other | Attending: Pulmonary Disease | Admitting: Pulmonary Disease

## 2021-01-26 DIAGNOSIS — R0609 Other forms of dyspnea: Secondary | ICD-10-CM | POA: Insufficient documentation

## 2021-01-26 LAB — COMPREHENSIVE METABOLIC PANEL
ALT: 10 U/L (ref 0–44)
AST: 19 U/L (ref 15–41)
Albumin: 3.8 g/dL (ref 3.5–5.0)
Alkaline Phosphatase: 46 U/L (ref 38–126)
Anion gap: 8 (ref 5–15)
BUN: 26 mg/dL — ABNORMAL HIGH (ref 8–23)
CO2: 27 mmol/L (ref 22–32)
Calcium: 9.6 mg/dL (ref 8.9–10.3)
Chloride: 104 mmol/L (ref 98–111)
Creatinine, Ser: 1.93 mg/dL — ABNORMAL HIGH (ref 0.61–1.24)
GFR, Estimated: 36 mL/min — ABNORMAL LOW (ref >60)
Glucose, Bld: 135 mg/dL — ABNORMAL HIGH (ref 70–99)
Potassium: 4.8 mmol/L (ref 3.5–5.1)
Sodium: 139 mmol/L (ref 135–145)
Total Bilirubin: 0.5 mg/dL (ref 0.3–1.2)
Total Protein: 7.3 g/dL (ref 6.5–8.1)

## 2021-01-26 LAB — CBC WITH DIFFERENTIAL/PLATELET
Abs Immature Granulocytes: 0.03 10*3/uL (ref 0.00–0.07)
Basophils Absolute: 0.1 10*3/uL (ref 0.0–0.1)
Basophils Relative: 1 %
Eosinophils Absolute: 0.6 10*3/uL — ABNORMAL HIGH (ref 0.0–0.5)
Eosinophils Relative: 6 %
HCT: 46.8 % (ref 39.0–52.0)
Hemoglobin: 15.3 g/dL (ref 13.0–17.0)
Immature Granulocytes: 0 %
Lymphocytes Relative: 19 %
Lymphs Abs: 1.9 10*3/uL (ref 0.7–4.0)
MCH: 29.7 pg (ref 26.0–34.0)
MCHC: 32.7 g/dL (ref 30.0–36.0)
MCV: 90.9 fL (ref 80.0–100.0)
Monocytes Absolute: 0.9 10*3/uL (ref 0.1–1.0)
Monocytes Relative: 9 %
Neutro Abs: 6.4 10*3/uL (ref 1.7–7.7)
Neutrophils Relative %: 65 %
Platelets: 244 10*3/uL (ref 150–400)
RBC: 5.15 MIL/uL (ref 4.22–5.81)
RDW: 15.4 % (ref 11.5–15.5)
WBC: 9.8 10*3/uL (ref 4.0–10.5)
nRBC: 0 % (ref 0.0–0.2)

## 2021-01-26 LAB — BRAIN NATRIURETIC PEPTIDE: B Natriuretic Peptide: 372.9 pg/mL — ABNORMAL HIGH (ref 0.0–100.0)

## 2021-01-26 LAB — D-DIMER, QUANTITATIVE: D-Dimer, Quant: 0.91 ug/mL-FEU — ABNORMAL HIGH (ref 0.00–0.50)

## 2021-01-26 NOTE — Telephone Encounter (Signed)
Signed pended orders as pt did come into [facility] to have labs drawn.

## 2021-01-26 NOTE — Telephone Encounter (Signed)
Called and spoke with patient's son. He verbalized understanding of Dr. Juanetta Gosling recommendations. He asked if the in sleep lab could be discussed at tomorrow's visit. I advised him that could be discussed then.   I also asked him if he would be willing to come in for labwork this afternoon. He will speak with his parents to see if they would be able to come today. I have pended the orders.   Will leave message in triage for him to call back this afternoon.

## 2021-01-26 NOTE — Telephone Encounter (Signed)
Primary Pulmonologist: Sood Last office visit and with whom: 01/21/2021 What do we see them for (pulmonary problems): Snoring, DOE Last OV assessment/plan:  Assessment/Plan:    Snoring with excessive daytime sleepiness. - will need to arrange for a home sleep study   Dyspnea on exertion and at rest. - will arrange for chest xray and pulmonary function test - he can continue advair and albuterol for now; defer medication adjustments until after tests reviewed   CAD s/p CABG. - followed by Dr. Kate Sable with [facility] in [facility]   Obesity. - discussed how weight can impact sleep and risk for sleep disordered breathing - discussed options to assist with weight loss: combination of diet modification, cardiovascular and strength training exercises   Cardiovascular risk. - had an extensive discussion regarding the adverse health consequences related to untreated sleep disordered breathing - specifically discussed the risks for hypertension, coronary artery disease, cardiac dysrhythmias, cerebrovascular disease, and diabetes - lifestyle modification discussed   Safe driving practices. - discussed how sleep disruption can increase risk of accidents, particularly when driving - safe driving practices were discussed   Therapies for obstructive sleep apnea. - if the sleep study shows significant sleep apnea, then various therapies for treatment were reviewed: CPAP, oral appliance, and surgical interventions   Time Spent Involved in Patient Care on Day of Examination:  48 minutes   Follow up:    Patient Instructions  Chest xray today   Will arrange for home sleep study and pulmonary function test   Follow up in 4 to 6 weeks with Dr. Halford Chessman or Nurse Practitioner after your pulmonary function test is completed   Medication List:    Allergies as of 01/21/2021         Reactions    Butorphanol Itching    Morphine Other (See Comments)    Redness around IV site     Pedi-pre Tape Spray [wound Dressing Adhesive] Itching    Simvastatin Other (See Comments)    "Destroyed my muscles"    Statins Other (See Comments)    "Destroyed my muscles"            Medication List           Accurate as of January 21, 2021 11:09 AM. If you have any questions, ask your nurse or doctor.              Advair HFA 230-21 MCG/ACT inhaler Generic drug: fluticasone-salmeterol Inhale 1-2 puffs into the lungs 2 (two) times daily. Rinse mouth after each use.    albuterol 108 (90 Base) MCG/ACT inhaler Commonly known as: VENTOLIN HFA Inhale 2 puffs into the lungs every 6 (six) hours as needed for wheezing or shortness of breath.    allopurinol 300 MG tablet Commonly known as: ZYLOPRIM Take 1 tablet (300 mg total) by mouth daily.    aspirin EC 81 MG tablet Take 81 mg by mouth daily. Swallow whole.    blood glucose meter kit and supplies Dispense based on patient and insurance preference. Use up to four times daily as directed. (FOR ICD-10 E10.9, E11.9). check blood glucose fasting and before meals four times daily.    Co Q-10 400 MG Caps Take by mouth daily at 6 (six) AM.    desoximetasone 0.25 % cream Commonly known as: TOPICORT Apply 1 application topically 2 (two) times daily.    escitalopram 20 MG tablet Commonly known as: LEXAPRO Take 1 tablet (20 mg total) by mouth daily.    Fish  Oil 1200 MG Caps Take by mouth daily at 6 (six) AM.    gabapentin 300 MG capsule Commonly known as: NEURONTIN TAKE 2 CAPSULES(600 MG) BY MOUTH AT BEDTIME    glimepiride 1 MG tablet Commonly known as: AMARYL Take 1 tablet (1 mg total) by mouth daily with breakfast.    HYDROcodone-acetaminophen 5-325 MG tablet Commonly known as: Norco Take 1 tablet by mouth 3 (three) times daily as needed for moderate pain.    lovastatin 40 MG tablet Commonly known as: MEVACOR Take 40 mg by mouth at bedtime.    nitroGLYCERIN 0.4 MG SL tablet Commonly known as: NITROSTAT Place 1  tablet (0.4 mg total) under the tongue every 5 (five) minutes x 3 doses as needed for chest pain.    ticagrelor 90 MG Tabs tablet Commonly known as: BRILINTA Take 1 tablet (90 mg total) by mouth 2 (two) times daily.    torsemide 20 MG tablet Commonly known as: DEMADEX Take 1 tablet (20 mg total) by mouth daily as needed.    traZODone 150 MG tablet Commonly known as: DESYREL TAKE 1 TO 2 TABLETS(150 TO 300 MG) BY MOUTH AT BEDTIME AS NEEDED FOR SLEEP    VITAMIN D3 PO Take 4,000 Units by mouth.             Signature:  Chesley Mires, MD [place] Pager - [phone] 01/21/2021, 11:09 AM                         Patient Instructions by Chesley Mires, MD at 01/21/2021 10:30 AM  Author: Chesley Mires, MD Author Type: Physician Filed: 01/21/2021 10:56 AM  Note Status: Signed Cosign: Cosign Not Required Encounter Date: 01/21/2021  Editor: Chesley Mires, MD (Physician)               Chest xray today   Will arrange for home sleep study and pulmonary function test   Follow up in 4 to 6 weeks with Dr. Halford Chessman or Nurse Practitioner after your pulmonary function test is completed         Was appointment offered to patient (explain)?  Scheduled visit with Geraldo Pitter NP 11:30 am 01/27/2021   Reason for call: Per son, patient's condition is worsening.  Son lives with patient, states that he gets so sob while sitting in his recliner that he has to sit up to breathe.  He is using the inhalers and they do not seem to be helping.  He seems very anxious and fidgets a lot of the time.  His sats were 94% last night when he could not sleep and was extremely sob.  He cannot get the home sleep test until about 12 weeks from now.  I made him an appointment to see Beth tomorrow at 11:30 am.  Is there anything you recommend in the meantime?  Please advise.  Thank you.  (examples of things to ask: : When did symptoms start? Fever? Cough? Productive? Color to sputum?  More sputum than usual? Wheezing? Have you needed increased oxygen? Are you taking your respiratory medications? What over the counter measures have you tried?)  Allergies  Allergen Reactions   Butorphanol Itching   Morphine Other (See Comments)    Redness around IV site   Pedi-Pre Tape Spray [Wound Dressing Adhesive] Itching   Simvastatin Other (See Comments)    "Destroyed my muscles"   Statins Other (See Comments)    "Destroyed my muscles"  Immunization History  Administered Date(s) Administered   Fluad Quad(high Dose 65+) 12/10/2020   Influenza, High Dose Seasonal PF 01/13/2020   Moderna Sars-Covid-2 Vaccination 04/05/2019, 05/03/2019, 01/10/2020   Pneumococcal Conjugate-13 10/10/2014   Pneumococcal Polysaccharide-23 12/29/2018, 07/21/2020   Td 05/10/2017

## 2021-01-26 NOTE — Telephone Encounter (Signed)
Can put order in to change sleep study to be done in sleep lab if this gets approved by insurance.  If he can come in today for lab work, then please order CBC, CMET, BNP, and D dimer.  These can then be reviewed when he has assessment with Derl Barrow tomorrow.  He had chest xray on 01/22/21 already, so don't think he needs another chest xray until he has assessment by [facility] to determine if this is needed or if he needs V/Q scan to assess for thromboembolic disease.  If he doesn't feel like he can wait until tomorrow for appointment, then he might need to go to the ER for further assessment.

## 2021-01-27 ENCOUNTER — Other Ambulatory Visit: Payer: Self-pay

## 2021-01-27 ENCOUNTER — Ambulatory Visit: Payer: Medicare Other | Admitting: Primary Care

## 2021-01-27 ENCOUNTER — Ambulatory Visit (INDEPENDENT_AMBULATORY_CARE_PROVIDER_SITE_OTHER): Payer: Medicare Other | Admitting: Adult Health

## 2021-01-27 ENCOUNTER — Encounter: Payer: Self-pay | Admitting: Adult Health

## 2021-01-27 VITALS — BP 170/90 | HR 60 | Ht 69.0 in | Wt 265.2 lb

## 2021-01-27 DIAGNOSIS — R7989 Other specified abnormal findings of blood chemistry: Secondary | ICD-10-CM

## 2021-01-27 DIAGNOSIS — E1159 Type 2 diabetes mellitus with other circulatory complications: Secondary | ICD-10-CM | POA: Diagnosis not present

## 2021-01-27 DIAGNOSIS — J441 Chronic obstructive pulmonary disease with (acute) exacerbation: Secondary | ICD-10-CM | POA: Diagnosis not present

## 2021-01-27 DIAGNOSIS — I5033 Acute on chronic diastolic (congestive) heart failure: Secondary | ICD-10-CM

## 2021-01-27 DIAGNOSIS — R0602 Shortness of breath: Secondary | ICD-10-CM

## 2021-01-27 DIAGNOSIS — I152 Hypertension secondary to endocrine disorders: Secondary | ICD-10-CM

## 2021-01-27 MED ORDER — BREZTRI AEROSPHERE 160-9-4.8 MCG/ACT IN AERO: 2.0000 | INHALATION_SPRAY | Freq: Two times a day (BID) | RESPIRATORY_TRACT | 5 refills | Status: DC

## 2021-01-27 MED ORDER — BREZTRI AEROSPHERE 160-9-4.8 MCG/ACT IN AERO: 2.0000 | INHALATION_SPRAY | Freq: Two times a day (BID) | RESPIRATORY_TRACT | 0 refills | Status: DC

## 2021-01-27 NOTE — Progress Notes (Signed)
Reviewed and agree with assessment/plan.   Chesley Mires, MD [facility]monary/Critical Care 01/27/2021, 2:45 PM Pager:  [phone]

## 2021-01-27 NOTE — Assessment & Plan Note (Signed)
COPD with increased symptom burden.  May have a component of volume overload with elevated BNP. D-dimer does remain elevated.  Previous CT chest was negative in August for PE.  We will check a VQ scan as he has significant worsening shortness of breath.  He is at risk with sedentary lifestyle and obesity. We will change Advair to [facility] for triple maintenance inhaler.  Plan  Patient Instructions  Stop Advair .  Begin BREZTRI 2 puffs Twice daily  , rinse after use  Albuterol inhaler As needed   Torsemide 20mg  daily for next 2 days then As needed   Weigh daily.  Set up VQ scan .  Go for sleep study as planned. Follow up with Cardiology regarding elevated blood pressure.  Follow up with Dr. Halford Chessman  or Alivya Wegman NP in 2 weeks and As needed   Please contact office for sooner follow up if symptoms do not improve or worsen or seek emergency care

## 2021-01-27 NOTE — Addendum Note (Signed)
Addended by: Elby Beck R on: 01/27/2021 03:01 PM   Modules accepted: Orders

## 2021-01-27 NOTE — Progress Notes (Signed)
@Patient  ID: Tony Williams, male    DOB: 18-Mar-1945, 75 y.o.   MRN: [id number]  Chief Complaint  Patient presents with   Acute Visit    Referring provider: Virginia Crews, MD  HPI: 75 year old male former  smoker seen for pulmonary consult January 21, 2021 for snoring daytime sleepiness and progressive shortness of breath Medical history significant for coronary artery disease s/p CABG , right pleural effusion with fibrothorax and trapped lung, COPD, diabetes, congestive heart failure and hypertension, chronic kidney disease  TEST/EVENTS :  CT angio chest 11/08/20 >> fibrothorax on Rt with volume loss, negative for PE Echo 11/16/20 >> EF 60 to 65%  01/27/2021 Follow up : Dyspnea  Patient returns for a follow-up visit.  Patient was seen last week on January 21, 2021 for a pulmonary consult for snoring, daytime sleepiness and progressive shortness of breath. Last visit patient was recommended for a sleep study and set up for pulmonary function testing.  Chest x-ray showed right basilar atelectasis, small right pleural effusion pleural scarring stable.  Left lung clear. Patient carries a diagnosis of COPD is on Advair HFA twice daily. Complains that his breathing has been getting worse for last few months, over last 2 weeks to severe in nature. + orthopnea. Gets winded with minimal activity and rest. Worse at night when he tries to lie down. Has to prop up to breathe.  Has minimally productive cough , worse at night. No increased leg swelling . Has occasional chest pain, took Nitro on Sunday which relieved his chest pain . Did not help his dyspnea. Has increased anxiety. Says he is very scared with his breathing can not catch his breath.  Blood pressure is elevated today, lisinopril and metoprolol stopped due to orthostasis. Now using Demadex As needed  . Weight is up 5lbs.  Walk test in the office with O2 sats 89-93% on room air .  Appetite is good w/ no n/v/d  Walks with cane,  sedentary at home.  Labs done yesterday shows  D Dimer remains elevated, BNP is elevated ~372, stable CKD .       Allergies  Allergen Reactions   Butorphanol Itching   Morphine Other (See Comments)    Redness around IV site   Pedi-Pre Tape Spray [Wound Dressing Adhesive] Itching   Simvastatin Other (See Comments)    "Destroyed my muscles"   Statins Other (See Comments)    "Destroyed my muscles"    Immunization History  Administered Date(s) Administered   Fluad Quad(high Dose 65+) 12/10/2020   Influenza, High Dose Seasonal PF 01/13/2020   Moderna Sars-Covid-2 Vaccination 04/05/2019, 05/03/2019, 01/10/2020   Pneumococcal Conjugate-13 10/10/2014   Pneumococcal Polysaccharide-23 12/29/2018, 07/21/2020   Td 05/10/2017    Past Medical History:  Diagnosis Date   CAD S/P percutaneous coronary angioplasty    CAD, multiple vessel    Cancer ([place]) 2013   Bladder   Chronic kidney disease    COPD (chronic obstructive pulmonary disease) ([place])    Depression    Diabetes mellitus without complication ([place])    Heart problem    Hx of CABG x 4    LIMA-D2, SVG-OM (occluded), SeqSVG-D1-LAD.   Myocardial infarction ([place])     Tobacco History: Social History   Tobacco Use  Smoking Status Former   Types: Cigarettes   Quit date: 10/23/2003   Years since quitting: 17.2  Smokeless Tobacco Never   Counseling given: Not Answered   Outpatient Medications Prior to Visit  Medication Sig Dispense Refill  albuterol (VENTOLIN HFA) 108 (90 Base) MCG/ACT inhaler Inhale 2 puffs into the lungs every 6 (six) hours as needed for wheezing or shortness of breath. 8 g 2   allopurinol (ZYLOPRIM) 300 MG tablet Take 1 tablet (300 mg total) by mouth daily. 90 tablet 3   aspirin EC 81 MG tablet Take 81 mg by mouth daily. Swallow whole.     blood glucose meter kit and supplies Dispense based on patient and insurance preference. Use up to four times daily as directed. (FOR ICD-10 E10.9, E11.9). check blood  glucose fasting and before meals four times daily. 1 each 0   Cholecalciferol (VITAMIN D3 PO) Take 4,000 Units by mouth.     Coenzyme Q10 (CO Q-10) 400 MG CAPS Take by mouth daily at 6 (six) AM.     desoximetasone (TOPICORT) 0.25 % cream Apply 1 application topically 2 (two) times daily.     escitalopram (LEXAPRO) 20 MG tablet Take 1 tablet (20 mg total) by mouth daily. 90 tablet 1   gabapentin (NEURONTIN) 300 MG capsule TAKE 2 CAPSULES(600 MG) BY MOUTH AT BEDTIME 180 capsule 1   glimepiride (AMARYL) 1 MG tablet Take 1 tablet (1 mg total) by mouth daily with breakfast. 90 tablet 1   HYDROcodone-acetaminophen (NORCO) 5-325 MG tablet Take 1 tablet by mouth 3 (three) times daily as needed for moderate pain. 90 tablet 0   lovastatin (MEVACOR) 40 MG tablet Take 40 mg by mouth at bedtime.     nitroGLYCERIN (NITROSTAT) 0.4 MG SL tablet Place 1 tablet (0.4 mg total) under the tongue every 5 (five) minutes x 3 doses as needed for chest pain. 25 tablet 1   Omega-3 Fatty Acids (FISH OIL) 1200 MG CAPS Take by mouth daily at 6 (six) AM.     ticagrelor (BRILINTA) 90 MG TABS tablet Take 1 tablet (90 mg total) by mouth 2 (two) times daily. 60 tablet 11   torsemide (DEMADEX) 20 MG tablet Take 1 tablet (20 mg total) by mouth daily as needed. 30 tablet 3   traZODone (DESYREL) 150 MG tablet TAKE 1 TO 2 TABLETS(150 TO 300 MG) BY MOUTH AT BEDTIME AS NEEDED FOR SLEEP 180 tablet 1   fluticasone-salmeterol (ADVAIR HFA) 230-21 MCG/ACT inhaler Inhale 1-2 puffs into the lungs 2 (two) times daily. Rinse mouth after each use. 1 each 12   No facility-administered medications prior to visit.     Review of Systems:   Constitutional:   No  weight loss, night sweats,  Fevers, chills,  +fatigue, or  lassitude.  HEENT:   No headaches,  Difficulty swallowing,  Tooth/dental problems, or  Sore throat,                No sneezing, itching, ear ache, nasal congestion, post nasal drip,   CV:  No chest pain,  Orthopnea, PND,  swelling in lower extremities, anasarca, dizziness, palpitations, syncope.   GI  No heartburn, indigestion, abdominal pain, nausea, vomiting, diarrhea, change in bowel habits, loss of appetite, bloody stools.   Resp:   No chest wall deformity  Skin: no rash or lesions.  GU: no dysuria, change in color of urine, no urgency or frequency.  No flank pain, no hematuria   MS:  No joint pain or swelling.  No decreased range of motion.  No back pain.    Physical Exam  BP (!) 170/90 (BP Location: Left Arm, Patient Position: Sitting, Cuff Size: Large)   Pulse 60   Ht 5' 9"  (1.753 m)  Wt 265 lb 3.2 oz (120.3 kg)   SpO2 95%   BMI 39.16 kg/m   GEN: A/Ox3; pleasant , NAD, well nourished    HEENT:  [place]/AT,   NOSE-clear, THROAT-clear, no lesions, no postnasal drip or exudate noted.   NECK:  Supple w/ fair ROM; no JVD; normal carotid impulses w/o bruits; no thyromegaly or nodules palpated; no lymphadenopathy.    RESP  Clear  P & A; w/o, wheezes/ rales/ or rhonchi. no accessory muscle use, no dullness to percussion  CARD:  RRR, no m/r/g, tr  peripheral edema, pulses intact, no cyanosis or clubbing.  GI:   Soft & nt; nml bowel sounds; no organomegaly or masses detected.   Musco: Warm bil, no deformities or joint swelling noted.   Neuro: alert, no focal deficits noted.    Skin: Warm, no lesions or rashes    Lab Results:  CBC    Component Value Date/Time   WBC 9.8 01/26/2021 1439   RBC 5.15 01/26/2021 1439   HGB 15.3 01/26/2021 1439   HGB 15.6 04/22/2020 1017   HCT 46.8 01/26/2021 1439   HCT 46.9 04/22/2020 1017   PLT 244 01/26/2021 1439   PLT 221 04/22/2020 1017   MCV 90.9 01/26/2021 1439   MCV 90 04/22/2020 1017   MCH 29.7 01/26/2021 1439   MCHC 32.7 01/26/2021 1439   RDW 15.4 01/26/2021 1439   RDW 14.4 04/22/2020 1017   LYMPHSABS 1.9 01/26/2021 1439   LYMPHSABS 1.6 04/22/2020 1017   MONOABS 0.9 01/26/2021 1439   EOSABS 0.6 (H) 01/26/2021 1439   EOSABS 0.5 (H)  04/22/2020 1017   BASOSABS 0.1 01/26/2021 1439   BASOSABS 0.1 04/22/2020 1017    BMET    Component Value Date/Time   NA 139 01/26/2021 1439   NA 137 11/27/2020 1418   K 4.8 01/26/2021 1439   CL 104 01/26/2021 1439   CO2 27 01/26/2021 1439   GLUCOSE 135 (H) 01/26/2021 1439   BUN 26 (H) 01/26/2021 1439   BUN 39 (H) 11/27/2020 1418   CREATININE 1.93 (H) 01/26/2021 1439   CALCIUM 9.6 01/26/2021 1439   GFRNONAA 36 (L) 01/26/2021 1439   GFRAA 51 (L) 04/22/2020 1017    BNP    Component Value Date/Time   BNP 372.9 (H) 01/26/2021 1442    ProBNP No results found for: PROBNP  Imaging: DG Chest 2 View  Result Date: 01/22/2021 CLINICAL DATA:  Dyspnea. EXAM: CHEST - 2 VIEW COMPARISON:  12/09/2020 FINDINGS: 1135 hours. Low volume film with right base atelectasis or scarring, similar to prior. Small right pleural effusion/pleural scarring is stable. Left lung clear. The cardio pericardial silhouette is enlarged. The visualized bony structures of the thorax show no acute abnormality. IMPRESSION: Stable exam.  No new or progressive findings. Electronically Signed   By: Misty Stanley M.D.   On: 01/22/2021 11:22      PFT Results Latest Ref Rng & Units 01/22/2021  FVC-Pre L 2.05  FVC-Predicted Pre % 49  FVC-Post L 1.74  FVC-Predicted Post % 42  Pre FEV1/FVC % % 75  Post FEV1/FCV % % 83  FEV1-Pre L 1.54  FEV1-Predicted Pre % 51  FEV1-Post L 1.44  DLCO uncorrected ml/min/mmHg 13.43  DLCO UNC% % 54  DLVA Predicted % 103  TLC L 3.43  TLC % Predicted % 50  RV % Predicted % 72    No results found for: NITRICOXIDE      Assessment & Plan:   Chronic obstructive pulmonary disease ([place])  COPD with increased symptom burden.  May have a component of volume overload with elevated BNP. D-dimer does remain elevated.  Previous CT chest was negative in August for PE.  We will check a VQ scan as he has significant worsening shortness of breath.  He is at risk with sedentary lifestyle and  obesity. We will change Advair to [facility] for triple maintenance inhaler.  Plan  Patient Instructions  Stop Advair .  Begin BREZTRI 2 puffs Twice daily  , rinse after use  Albuterol inhaler As needed   Torsemide 63m daily for next 2 days then As needed   Weigh daily.  Set up VQ scan .  Go for sleep study as planned. Follow up with Cardiology regarding elevated blood pressure.  Follow up with Dr. SHalford Chessman or Shivani Barrantes NP in 2 weeks and As needed   Please contact office for sooner follow up if symptoms do not improve or worsen or seek emergency care          Acute on chronic diastolic CHF (congestive heart failure) ([place] Patient has increased symptom burden with orthopnea, weight gain since being off of Demadex.  Recent BNP is elevated.  Will restart Demadex 20 mg daily for the next 2 days then as needed.  Advised to weigh daily.  Follow-up with cardiology.  Plan  Patient Instructions  Stop Advair .  Begin BREZTRI 2 puffs Twice daily  , rinse after use  Albuterol inhaler As needed   Torsemide 252mdaily for next 2 days then As needed   Weigh daily.  Set up VQ scan .  Go for sleep study as planned. Follow up with Cardiology regarding elevated blood pressure.  Follow up with Dr. SoHalford Chessmanor Kingston Shawgo NP in 2 weeks and As needed   Please contact office for sooner follow up if symptoms do not improve or worsen or seek emergency care         Hypertension associated with diabetes [place].  His medications have recently been changed by cardiology. He is to restart torsemide for the next 2 days.  Then as needed.  Keep a check of his blood pressures and follow-up with cardiology.  Dyspnea Progressive dyspnea suspect is multifactorial in nature with multiple comorbidities including probable COPD, congestive heart failure and physical deconditioning. Will check VQ scan to rule out underlying VTE. Change Advair to [employer]. Restart gentle  diuresis. Sleep study and PFTs are pending   Plan  Patient Instructions  Stop Advair .  Begin BREZTRI 2 puffs Twice daily  , rinse after use  Albuterol inhaler As needed   Torsemide 2036maily for next 2 days then As needed   Weigh daily.  Set up VQ scan .  Go for sleep study as planned. Follow up with Cardiology regarding elevated blood pressure.  Follow up with Dr. SooHalford Chessmanr Athene Schuhmacher NP in 2 weeks and As needed   Please contact office for sooner follow up if symptoms do not improve or worsen or seek emergency care           TamRexene EdisonP 01/27/2021

## 2021-01-27 NOTE — Patient Instructions (Addendum)
Stop Advair .  Begin BREZTRI 2 puffs Twice daily  , rinse after use  Albuterol inhaler As needed   Torsemide 20mg  daily for next 2 days then As needed   Weigh daily.  Set up VQ scan .  Go for sleep study as planned. Follow up with Cardiology regarding elevated blood pressure.  Follow up with Dr. Halford Chessman  or Oslo Huntsman NP in 2 weeks and As needed   Please contact office for sooner follow up if symptoms do not improve or worsen or seek emergency care

## 2021-01-27 NOTE — Assessment & Plan Note (Addendum)
Progressive dyspnea suspect is multifactorial in nature with multiple comorbidities including probable COPD, congestive heart failure and physical deconditioning. Will check VQ scan to rule out underlying VTE. Change Advair to [employer] inhaler. Restart gentle diuresis. Sleep study and PFTs are pending   Plan  Patient Instructions  Stop Advair .  Begin BREZTRI 2 puffs Twice daily  , rinse after use  Albuterol inhaler As needed   Torsemide 20mg  daily for next 2 days then As needed   Weigh daily.  Set up VQ scan .  Go for sleep study as planned. Follow up with Cardiology regarding elevated blood pressure.  Follow up with Dr. Halford Chessman  or Tiombe Tomeo NP in 2 weeks and As needed   Please contact office for sooner follow up if symptoms do not improve or worsen or seek emergency care

## 2021-01-27 NOTE — Assessment & Plan Note (Signed)
Patient has increased symptom burden with orthopnea, weight gain since being off of Demadex.  Recent BNP is elevated.  Will restart Demadex 20 mg daily for the next 2 days then as needed.  Advised to weigh daily.  Follow-up with cardiology.  Plan  Patient Instructions  Stop Advair .  Begin BREZTRI 2 puffs Twice daily  , rinse after use  Albuterol inhaler As needed   Torsemide 20mg  daily for next 2 days then As needed   Weigh daily.  Set up VQ scan .  Go for sleep study as planned. Follow up with Cardiology regarding elevated blood pressure.  Follow up with Dr. Halford Chessman  or Kanen Mottola NP in 2 weeks and As needed   Please contact office for sooner follow up if symptoms do not improve or worsen or seek emergency care

## 2021-01-27 NOTE — Progress Notes (Deleted)
_0  ID: Tony Williams, male    DOB: 03/21/1945, 75 y.o.   MRN: [id number]  No chief complaint on file.   Referring provider: Virginia Crews, MD  HPI: 75 year old male, former smoker quit in 2005. PMH significant for COPD, NSTEMI, CAD hx CABG, chronic diastolic CHF, idiopathic hypotension, bradycardia, type 2 diabetes, CKD 4, bladder cancer, depression. Patient of Dr. Halford Chessman, seen for initial consult on 01/21/21.    Previous [facility] pulmonary encounter:  01/21/21- Consult, Dr. Halford Chessman  He is followed by cardiology.  He has progressive dyspnea at rest and with exertion.  He feels this started after he had heart catheterization with stenting.  He gets winded after walking about 25 feet.  He has intermittent cough.  Not having wheeze, sputum, or hemoptysis.  Not having chest pain or palpitations.  Has been using advair and albuterol, but doesn't feel like these help.  He had CT chest when in hospital in August.  Showed Rt fibrothorax with volume loss.  Says he had pleural effusion after having cholecystitis, but was told intervention at the time would be too risky.  He smoked 1.5 ppd and quit about 25 years ago.  He snores and his wife says he stops breathing at night.  He is a restless sleeper.  He wakes up 1 or 2 times to use the bathroom.  He goes to bed at 1 am, but doesn't fall asleep until 230 am.  He gets out of bed at 10 am.  He is not using anything to help stay awake.  Epworth score is 3 out of 24.  Labs from 12/09/20: WBC 10.5, Hb 12.9, PLT 195, Na 136, K 4.4, CO2 25, Creatinine 2.47, Ca 8.6  Ambulatory oximetry on room air today: maintained SpO2 > 92%, HR from 60 to 95.  Was only able to complete 2 laps - stopped due to feeling short of breath.   01/27/2021- Interim hx  Patient presents today for acute OV/shortness of breath. During last office visit on 01/21/21 he was ordered or CXR, PFTs and HST. CXR on 01/21/21 showed small right pleural effusion/pleural scarring is stable.  Cardio pericardiac silhouette is enlarged. No acute abnormality. Pulmonary function testing on 01/22/21 showed changes of COPD; moderate obstruction/restriction and moderate diffusion defect.   Patient was [facility], he had labs drawn which were significant for  BNP 379     BNP? HRCT?  Imaging: 110/9/22 CXR-  Right base atelectasis or scarring, similar to prior. Small right pleural effusion/pleural scarring is stable. Left lung clear. The cardio pericardial silhouette is enlarged. IMPRESSION: Stable exam.  No new or progressive findings.  Pulmonary function testing: 01/22/21- FVC 1.74 (42%); FEV1 1.44 (48%), ratio 83, TLC 50%, DLCO 13.43 (54%)   Allergies  Allergen Reactions   Butorphanol Itching   Morphine Other (See Comments)    Redness around IV site   Pedi-Pre Tape Spray [Wound Dressing Adhesive] Itching   Simvastatin Other (See Comments)    "Destroyed my muscles"   Statins Other (See Comments)    "Destroyed my muscles"    Immunization History  Administered Date(s) Administered   Fluad Quad(high Dose 65+) 12/10/2020   Influenza, High Dose Seasonal PF 01/13/2020   Moderna Sars-Covid-2 Vaccination 04/05/2019, 05/03/2019, 01/10/2020   Pneumococcal Conjugate-13 10/10/2014   Pneumococcal Polysaccharide-23 12/29/2018, 07/21/2020   Td 05/10/2017    Past Medical History:  Diagnosis Date   CAD S/P percutaneous coronary angioplasty    CAD, multiple vessel  Cancer [facility]) 2013   Bladder   Chronic kidney disease    COPD (chronic obstructive pulmonary disease) ([facility])    Depression    Diabetes mellitus without complication ([facility])    Heart problem    Hx of CABG x 4    LIMA-D2, SVG-OM (occluded), SeqSVG-D1-LAD.   Myocardial infarction ([place])     Tobacco History: Social History   Tobacco Use  Smoking Status Former   Types: Cigarettes   Quit date: 10/23/2003   Years since quitting: 17.2  Smokeless Tobacco Never   Counseling given: Not  Answered   Outpatient Medications Prior to Visit  Medication Sig Dispense Refill   albuterol (VENTOLIN HFA) 108 (90 Base) MCG/ACT inhaler Inhale 2 puffs into the lungs every 6 (six) hours as needed for wheezing or shortness of breath. 8 g 2   allopurinol (ZYLOPRIM) 300 MG tablet Take 1 tablet (300 mg total) by mouth daily. 90 tablet 3   aspirin EC 81 MG tablet Take 81 mg by mouth daily. Swallow whole.     blood glucose meter kit and supplies Dispense based on patient and insurance preference. Use up to four times daily as directed. (FOR ICD-10 E10.9, E11.9). check blood glucose fasting and before meals four times daily. 1 each 0   Cholecalciferol (VITAMIN D3 PO) Take 4,000 Units by mouth.     Coenzyme Q10 (CO Q-10) 400 MG CAPS Take by mouth daily at 6 (six) AM.     desoximetasone (TOPICORT) 0.25 % cream Apply 1 application topically 2 (two) times daily.     escitalopram (LEXAPRO) 20 MG tablet Take 1 tablet (20 mg total) by mouth daily. 90 tablet 1   fluticasone-salmeterol (ADVAIR HFA) 230-21 MCG/ACT inhaler Inhale 1-2 puffs into the lungs 2 (two) times daily. Rinse mouth after each use. 1 each 12   gabapentin (NEURONTIN) 300 MG capsule TAKE 2 CAPSULES(600 MG) BY MOUTH AT BEDTIME 180 capsule 1   glimepiride (AMARYL) 1 MG tablet Take 1 tablet (1 mg total) by mouth daily with breakfast. 90 tablet 1   HYDROcodone-acetaminophen (NORCO) 5-325 MG tablet Take 1 tablet by mouth 3 (three) times daily as needed for moderate pain. 90 tablet 0   lovastatin (MEVACOR) 40 MG tablet Take 40 mg by mouth at bedtime.     nitroGLYCERIN (NITROSTAT) 0.4 MG SL tablet Place 1 tablet (0.4 mg total) under the tongue every 5 (five) minutes x 3 doses as needed for chest pain. 25 tablet 1   Omega-3 Fatty Acids (FISH OIL) 1200 MG CAPS Take by mouth daily at 6 (six) AM.     ticagrelor (BRILINTA) 90 MG TABS tablet Take 1 tablet (90 mg total) by mouth 2 (two) times daily. 60 tablet 11   torsemide (DEMADEX) 20 MG tablet Take 1  tablet (20 mg total) by mouth daily as needed. 30 tablet 3   traZODone (DESYREL) 150 MG tablet TAKE 1 TO 2 TABLETS(150 TO 300 MG) BY MOUTH AT BEDTIME AS NEEDED FOR SLEEP 180 tablet 1   No facility-administered medications prior to visit.      Review of Systems  Review of Systems   Physical Exam  There were no vitals taken for this visit. Physical Exam   Lab Results:  CBC    Component Value Date/Time   WBC 9.8 01/26/2021 1439   RBC 5.15 01/26/2021 1439   HGB 15.3 01/26/2021 1439   HGB 15.6 04/22/2020 1017   HCT 46.8 01/26/2021 1439   HCT 46.9 04/22/2020 1017   PLT  244 01/26/2021 1439   PLT 221 04/22/2020 1017   MCV 90.9 01/26/2021 1439   MCV 90 04/22/2020 1017   MCH 29.7 01/26/2021 1439   MCHC 32.7 01/26/2021 1439   RDW 15.4 01/26/2021 1439   RDW 14.4 04/22/2020 1017   LYMPHSABS 1.9 01/26/2021 1439   LYMPHSABS 1.6 04/22/2020 1017   MONOABS 0.9 01/26/2021 1439   EOSABS 0.6 (H) 01/26/2021 1439   EOSABS 0.5 (H) 04/22/2020 1017   BASOSABS 0.1 01/26/2021 1439   BASOSABS 0.1 04/22/2020 1017    BMET    Component Value Date/Time   NA 139 01/26/2021 1439   NA 137 11/27/2020 1418   K 4.8 01/26/2021 1439   CL 104 01/26/2021 1439   CO2 27 01/26/2021 1439   GLUCOSE 135 (H) 01/26/2021 1439   BUN 26 (H) 01/26/2021 1439   BUN 39 (H) 11/27/2020 1418   CREATININE 1.93 (H) 01/26/2021 1439   CALCIUM 9.6 01/26/2021 1439   GFRNONAA 36 (L) 01/26/2021 1439   GFRAA 51 (L) 04/22/2020 1017    BNP    Component Value Date/Time   BNP 372.9 (H) 01/26/2021 1442    ProBNP No results found for: PROBNP  Imaging: DG Chest 2 View  Result Date: 01/22/2021 CLINICAL DATA:  Dyspnea. EXAM: CHEST - 2 VIEW COMPARISON:  12/09/2020 FINDINGS: 1135 hours. Low volume film with right base atelectasis or scarring, similar to prior. Small right pleural effusion/pleural scarring is stable. Left lung clear. The cardio pericardial silhouette is enlarged. The visualized bony structures of the  thorax show no acute abnormality. IMPRESSION: Stable exam.  No new or progressive findings. Electronically Signed   By: Misty Stanley M.D.   On: 01/22/2021 11:22     Assessment & Plan:   No problem-specific Assessment & Plan notes found for this encounter.     Martyn Ehrich, NP 01/27/2021

## 2021-01-27 NOTE — Assessment & Plan Note (Signed)
Patient's blood pressure is elevated.  His medications have recently been changed by cardiology. He is to restart torsemide for the next 2 days.  Then as needed.  Keep a check of his blood pressures and follow-up with cardiology.

## 2021-01-28 ENCOUNTER — Ambulatory Visit
Admission: RE | Admit: 2021-01-28 | Discharge: 2021-01-28 | Disposition: A | Discharge status: Home / Self Care | Payer: Medicare Other | Source: Ambulatory Visit | Attending: Adult Health | Admitting: Adult Health

## 2021-01-28 DIAGNOSIS — R7989 Other specified abnormal findings of blood chemistry: Secondary | ICD-10-CM | POA: Diagnosis not present

## 2021-01-28 DIAGNOSIS — J811 Chronic pulmonary edema: Secondary | ICD-10-CM | POA: Diagnosis not present

## 2021-01-28 DIAGNOSIS — R0602 Shortness of breath: Secondary | ICD-10-CM

## 2021-01-28 DIAGNOSIS — I517 Cardiomegaly: Secondary | ICD-10-CM | POA: Diagnosis not present

## 2021-01-28 DIAGNOSIS — I700 Atherosclerosis of aorta: Secondary | ICD-10-CM | POA: Diagnosis not present

## 2021-01-28 DIAGNOSIS — R918 Other nonspecific abnormal finding of lung field: Secondary | ICD-10-CM | POA: Diagnosis not present

## 2021-01-28 MED ORDER — TECHNETIUM TO 99M ALBUMIN AGGREGATED
4.0000 | Freq: Once | INTRAVENOUS | Status: AC | PRN
  Administered 2021-01-28: 4.05 via INTRAVENOUS

## 2021-01-29 ENCOUNTER — Encounter: Payer: Self-pay | Admitting: Family Medicine

## 2021-01-29 ENCOUNTER — Other Ambulatory Visit: Payer: Self-pay

## 2021-01-29 ENCOUNTER — Ambulatory Visit (INDEPENDENT_AMBULATORY_CARE_PROVIDER_SITE_OTHER): Payer: Medicare Other | Admitting: Family Medicine

## 2021-01-29 VITALS — BP 144/70 | HR 71 | Temp 97.7°F | Resp 16 | Wt 263.7 lb

## 2021-01-29 DIAGNOSIS — F411 Generalized anxiety disorder: Secondary | ICD-10-CM

## 2021-01-29 DIAGNOSIS — E1142 Type 2 diabetes mellitus with diabetic polyneuropathy: Secondary | ICD-10-CM

## 2021-01-29 DIAGNOSIS — F5104 Psychophysiologic insomnia: Secondary | ICD-10-CM | POA: Diagnosis not present

## 2021-01-29 DIAGNOSIS — E1169 Type 2 diabetes mellitus with other specified complication: Secondary | ICD-10-CM

## 2021-01-29 DIAGNOSIS — E785 Hyperlipidemia, unspecified: Secondary | ICD-10-CM

## 2021-01-29 MED ORDER — CLONAZEPAM 0.5 MG PO TABS: 0.5000 mg | ORAL_TABLET | Freq: Every evening | ORAL | 1 refills | Status: DC | PRN

## 2021-01-29 MED ORDER — GABAPENTIN 300 MG PO CAPS: ORAL_CAPSULE | ORAL | 1 refills | Status: DC

## 2021-01-29 NOTE — Assessment & Plan Note (Signed)
Continue statin Recheck at next visit

## 2021-01-29 NOTE — Assessment & Plan Note (Signed)
Chronic and uncontrolled Contributes to his insomnia and that he ruminates at bedtime Continue Lexapro at current dose Offered referral to psychiatry and patient declines As below, we will start low-dose clonazepam for sleep and discontinue his trazodone I did advise him he cannot be on benzos and narcotics at the same time and he has agreed to stop his Norco

## 2021-01-29 NOTE — Assessment & Plan Note (Signed)
Chronic and uncontrolled Failed melatonin, trazodone Discussed risks of benzos, especially in setting of narcotics Agrees to stop his narcotic as above Will use clonazepam 0.5 mg nightly as needed Discussed fall precautions Follow-up at next visit to see how he is doing Consider psychiatry referral

## 2021-01-29 NOTE — Assessment & Plan Note (Signed)
Reviewed A1c from last month 6.9 is great for him Advised him not to resume the glimepiride that gave him hypoglycemia Continue lifestyle management Recheck A1c at next appt in 50m

## 2021-01-29 NOTE — Progress Notes (Signed)
Established patient visit   Patient: Tony Williams   DOB: 02-15-1946   75 y.o. Male  MRN: [id number] Visit Date: 01/29/2021  Today's healthcare provider: [name], MD   Chief Complaint  Patient presents with   Diabetes   Hyperlipidemia   Insomnia   Anxiety   Subjective    HPI  Follow up for diabetes  The patient was last seen for this 1 months ago. Changes made at last visit include restart Glimepiride.  He reports excellent compliance with treatment. He feels that condition is Unchanged. He is not having side effects.   ----------------------------------------------------------------------------------------- Lipid/Cholesterol, Follow-up  Last lipid panel Other pertinent labs  Lab Results  Component Value Date   CHOL 123 11/09/2020   HDL 32 (L) 11/09/2020   LDLCALC 47 11/09/2020   TRIG 219 (H) 11/09/2020   CHOLHDL 3.8 11/09/2020   Lab Results  Component Value Date   ALT 10 01/26/2021   AST 19 01/26/2021   PLT 244 01/26/2021   TSH 2.515 12/08/2020     He was last seen for this 6 months ago.  Management since that visit includes no changes.  He reports excellent compliance with treatment. He is not having side effects.   Symptoms: No chest pain No chest pressure/discomfort  Yes dyspnea No lower extremity edema  Yes numbness or tingling of extremity No orthopnea  Yes palpitations Yes paroxysmal nocturnal dyspnea  No speech difficulty No syncope   Current diet: in general, a "healthy" diet  , well balanced Current exercise: none  The ASCVD Risk score (Arnett DK, et al., 2019) failed to calculate for the following reasons:   The patient has a prior MI or stroke diagnosis  --------------------------------------------------------------------------------------------------- Follow up for insomnia  The patient was last seen for this 6 months ago. Changes made at last visit include resume high dose trazodone.  He reports fair compliance  with treatment. Patient reports taking gabapentin 2 capsules, trasodone 3 tablets, and norco 2 tablets every night for sleep.  He feels that condition is Unchanged. worse He is not having side effects. Feeling "drunk"  Patient reports that for the last two night he took clonazapam from his son. Reports he sleep really good.  -----------------------------------------------------------------------------------------   Medications: Outpatient Medications Prior to Visit  Medication Sig   albuterol (VENTOLIN HFA) 108 (90 Base) MCG/ACT inhaler Inhale 2 puffs into the lungs every 6 (six) hours as needed for wheezing or shortness of breath.   allopurinol (ZYLOPRIM) 300 MG tablet Take 1 tablet (300 mg total) by mouth daily.   aspirin EC 81 MG tablet Take 81 mg by mouth daily. Swallow whole.   blood glucose meter kit and supplies Dispense based on patient and insurance preference. Use up to four times daily as directed. (FOR ICD-10 E10.9, E11.9). check blood glucose fasting and before meals four times daily.   Budeson-Glycopyrrol-Formoterol (BREZTRI AEROSPHERE) 160-9-4.8 MCG/ACT AERO Inhale 2 puffs into the lungs 2 (two) times daily.   Budeson-Glycopyrrol-Formoterol (BREZTRI AEROSPHERE) 160-9-4.8 MCG/ACT AERO Inhale 2 puffs into the lungs in the morning and at bedtime.   Cholecalciferol (VITAMIN D3 PO) Take 4,000 Units by mouth.   Coenzyme Q10 (CO Q-10) 400 MG CAPS Take by mouth daily at 6 (six) AM.   desoximetasone (TOPICORT) 0.25 % cream Apply 1 application topically 2 (two) times daily.   escitalopram (LEXAPRO) 20 MG tablet Take 1 tablet (20 mg total) by mouth daily.   lovastatin (MEVACOR) 40 MG tablet Take 40 mg by mouth  at bedtime.   nitroGLYCERIN (NITROSTAT) 0.4 MG SL tablet Place 1 tablet (0.4 mg total) under the tongue every 5 (five) minutes x 3 doses as needed for chest pain.   Omega-3 Fatty Acids (FISH OIL) 1200 MG CAPS Take by mouth daily at 6 (six) AM.   ticagrelor (BRILINTA) 90 MG TABS  tablet Take 1 tablet (90 mg total) by mouth 2 (two) times daily.   torsemide (DEMADEX) 20 MG tablet Take 1 tablet (20 mg total) by mouth daily as needed.   [DISCONTINUED] gabapentin (NEURONTIN) 300 MG capsule TAKE 2 CAPSULES(600 MG) BY MOUTH AT BEDTIME   [DISCONTINUED] glimepiride (AMARYL) 1 MG tablet Take 1 tablet (1 mg total) by mouth daily with breakfast.   [DISCONTINUED] HYDROcodone-acetaminophen (NORCO) 5-325 MG tablet Take 1 tablet by mouth 3 (three) times daily as needed for moderate pain. (Patient taking differently: Take 1 tablet by mouth 3 (three) times daily as needed for moderate pain. Patient taking 2 tablets at bedtime every night)   [DISCONTINUED] traZODone (DESYREL) 150 MG tablet TAKE 1 TO 2 TABLETS(150 TO 300 MG) BY MOUTH AT BEDTIME AS NEEDED FOR SLEEP (Patient taking differently: Patient taking 3 tablet every night)   No facility-administered medications prior to visit.    Review of Systems  Constitutional:  Positive for activity change.  Respiratory:  Positive for cough, shortness of breath and wheezing.   Cardiovascular:  Positive for palpitations. Negative for chest pain and leg swelling.  Psychiatric/Behavioral:  Positive for sleep disturbance. The patient is nervous/anxious.       Objective    BP (!) 144/70 (BP Location: Right Arm, Patient Position: Sitting, Cuff Size: Large)   Pulse 71   Temp 97.7 F (36.5 C) (Temporal)   Resp 16   Wt 263 lb 11.2 oz (119.6 kg)   SpO2 95%   BMI 38.94 kg/m  {Show previous vital signs (optional):23777}  Physical Exam Constitutional:      General: He is not in acute distress.    Appearance: Normal appearance. He is not diaphoretic.  HENT:     Head: Normocephalic.  Eyes:     Conjunctiva/sclera: Conjunctivae normal.  Pulmonary:     Effort: Pulmonary effort is normal. No respiratory distress.  Neurological:     Mental Status: He is alert and oriented to person, place, and time. Mental status is at baseline.  Psychiatric:         Mood and Affect: Mood normal.        Behavior: Behavior normal.      No results found for any visits on 01/29/21.  Assessment & Plan     Problem List Items Addressed This Visit       Endocrine   Controlled type 2 diabetes mellitus with diabetic polyneuropathy, without long-term current use of insulin ([place]) - Primary    Reviewed A1c from last month 6.9 is great for him Advised him not to resume the glimepiride that gave him hypoglycemia Continue lifestyle management Recheck A1c at next appt in 38m     Relevant Medications   gabapentin (NEURONTIN) 300 MG capsule   clonazePAM (KLONOPIN) 0.5 MG tablet   Diabetic polyneuropathy associated with type 2 diabetes mellitus ([place]    Chronic and fairly well controlled Patient agrees to stop his Norco that he takes for this and increase his gabapentin to 300 mg twice daily and 600 mg at bedtime      Relevant Medications   gabapentin (NEURONTIN) 300 MG capsule   clonazePAM (KLONOPIN) 0.5 MG  tablet   Hyperlipidemia associated with type 2 diabetes mellitus ([place])    Continue statin Recheck at next visit        Other   GAD (generalized anxiety disorder)    Chronic and uncontrolled Contributes to his insomnia and that he ruminates at bedtime Continue Lexapro at current dose Offered referral to psychiatry and patient declines As below, we will start low-dose clonazepam for sleep and discontinue his trazodone I did advise him he cannot be on benzos and narcotics at the same time and he has agreed to stop his Norco      Psychophysiological insomnia    Chronic and uncontrolled Failed melatonin, trazodone Discussed risks of benzos, especially in setting of narcotics Agrees to stop his narcotic as above Will use clonazepam 0.5 mg nightly as needed Discussed fall precautions Follow-up at next visit to see how he is doing Consider psychiatry referral        Return in about 2 months (around 03/31/2021) for chronic disease f/u.       I, [name], MD, have reviewed all documentation for this visit. The documentation on 01/29/21 for the exam, diagnosis, procedures, and orders are all accurate and complete.   Sabien Umland, Dionne Bucy, MD, MPH [facility]

## 2021-01-29 NOTE — Assessment & Plan Note (Signed)
Chronic and fairly well controlled Patient agrees to stop his Norco that he takes for this and increase his gabapentin to 300 mg twice daily and 600 mg at bedtime

## 2021-02-11 ENCOUNTER — Telehealth: Payer: Self-pay | Admitting: Family Medicine

## 2021-02-11 NOTE — Telephone Encounter (Signed)
Lmtcb, to schedule an office visit to discuss medication changes.

## 2021-02-11 NOTE — Telephone Encounter (Signed)
Pt called and stated that he is taking 2 of   clonazePAM (KLONOPIN) 0.5 MG tablet. He states that 1 is not enough. He would like a call back from Dr B nurse if possible. Please advise.

## 2021-02-11 NOTE — Telephone Encounter (Signed)
Pt scheduled for 02/18/21. Stated he would be out of clonazePAM (KLONOPIN) 0.5 MG tablet in three days.

## 2021-02-11 NOTE — Telephone Encounter (Signed)
PEC please advise as below and schedule if patient with Dr. Jacinto Reap or Tally Joe.

## 2021-02-12 ENCOUNTER — Telehealth: Payer: Self-pay

## 2021-02-12 ENCOUNTER — Other Ambulatory Visit: Payer: Self-pay

## 2021-02-12 ENCOUNTER — Ambulatory Visit (INDEPENDENT_AMBULATORY_CARE_PROVIDER_SITE_OTHER): Payer: Medicare Other | Admitting: Adult Health

## 2021-02-12 ENCOUNTER — Encounter: Payer: Self-pay | Admitting: Adult Health

## 2021-02-12 VITALS — BP 124/80 | HR 73 | Temp 97.1°F | Ht 69.0 in | Wt 263.4 lb

## 2021-02-12 DIAGNOSIS — R400 Somnolence: Secondary | ICD-10-CM | POA: Insufficient documentation

## 2021-02-12 DIAGNOSIS — I5033 Acute on chronic diastolic (congestive) heart failure: Secondary | ICD-10-CM

## 2021-02-12 DIAGNOSIS — J449 Chronic obstructive pulmonary disease, unspecified: Secondary | ICD-10-CM | POA: Diagnosis not present

## 2021-02-12 MED ORDER — BREZTRI AEROSPHERE 160-9-4.8 MCG/ACT IN AERO: 2.0000 | INHALATION_SPRAY | Freq: Two times a day (BID) | RESPIRATORY_TRACT | 5 refills | Status: DC

## 2021-02-12 MED ORDER — BREZTRI AEROSPHERE 160-9-4.8 MCG/ACT IN AERO: 2.0000 | INHALATION_SPRAY | Freq: Two times a day (BID) | RESPIRATORY_TRACT | 0 refills | Status: DC

## 2021-02-12 MED ORDER — CLONAZEPAM 0.5 MG PO TABS: 0.5000 mg | ORAL_TABLET | Freq: Every evening | ORAL | 0 refills | Status: DC | PRN

## 2021-02-12 NOTE — Telephone Encounter (Signed)
Patient advised as below. Patient reports he has been taking gabapentin 300mg  BID and 900mg  every night. Patient reports that since stopping Norco he has been in a lot of pain and has not been able to sleep. Patient unhappy due to medication management and availability with PCP. He reports he will be out of medication before next appt with Daneil Dan. Please advise.

## 2021-02-12 NOTE — Progress Notes (Signed)
@Patient  ID: Tony Williams, male    DOB: [birth year], 75 y.o.   MRN: [id number]  Chief Complaint  Patient presents with   Follow-up    Referring provider: Virginia Crews, MD  HPI: 74 year old male former smoker seen for pulmonary consult January 21, 2021 for snoring and daytime sleepiness along with shortness of breath.  Medical history is significant for coronary artery disease status post CABG, right pleural effusion with fibrothorax and trapped lung, COPD, diabetes, congestive heart failure, hypertension and chronic kidney disease  TEST/EVENTS :  CT angio chest 11/08/20 >> fibrothorax on Rt with volume loss, negative for PE Echo 11/16/20 >> EF 60 to 65%  02/12/2021 Follow up ; Dyspnea, COP patient was seen last visit with a COPD exacerbationD  Patient returns for a 2-week follow-up.  He was recommended to change from Advair to [facility] inhaler.  He was felt to have a component of some volume overload with an elevated BNP.  His torsemide was increased for 2 days.  He was set up for a VQ scan that was negative.  He was referred back to cardiology for hypertension and diastolic dysfunction . Labs showed elevated BNP at 372.  Patient liked the [facility] but was unable to afford. He is in the doughnut hole.  PFT showed moderate restriction last month. With FEV1 at 51%, ratio 75, FVC 49%, no BD response., DLCO at 54% .  Since last office visit he is feeling better. Not as short of breath and decreased fluid retention. No fever , cough or congestion .   Patient is undergoing an evaluation for possible sleep apnea. Was suppose to have a home sleep study. Wants to do in lab study . Does not feel he can do home sleep at home.     Allergies  Allergen Reactions   Butorphanol Itching   Morphine Other (See Comments)    Redness around IV site   Pedi-Pre Tape Spray [Wound Dressing Adhesive] Itching   Simvastatin Other (See Comments)    "Destroyed my muscles"   Statins Other (See Comments)     "Destroyed my muscles"    Immunization History  Administered Date(s) Administered   Fluad Quad(high Dose 65+) 12/10/2020   Influenza, High Dose Seasonal PF 01/13/2020   Moderna Sars-Covid-2 Vaccination 04/05/2019, 05/03/2019, 01/10/2020   Pneumococcal Conjugate-13 10/10/2014   Pneumococcal Polysaccharide-23 12/29/2018, 07/21/2020   Td 05/10/2017    Past Medical History:  Diagnosis Date   CAD S/P percutaneous coronary angioplasty    CAD, multiple vessel    Cancer ([place]) 2013   Bladder   Chronic kidney disease    COPD (chronic obstructive pulmonary disease) ([place])    Depression    Diabetes mellitus without complication ([place])    Heart problem    Hx of CABG x 4    LIMA-D2, SVG-OM (occluded), SeqSVG-D1-LAD.   Myocardial infarction ([place])     Tobacco History: Social History   Tobacco Use  Smoking Status Former   Types: Cigarettes   Quit date: 10/23/2003   Years since quitting: 17.3  Smokeless Tobacco Never   Counseling given: Not Answered   Outpatient Medications Prior to Visit  Medication Sig Dispense Refill   albuterol (VENTOLIN HFA) 108 (90 Base) MCG/ACT inhaler Inhale 2 puffs into the lungs every 6 (six) hours as needed for wheezing or shortness of breath. 8 g 2   allopurinol (ZYLOPRIM) 300 MG tablet Take 1 tablet (300 mg total) by mouth daily. 90 tablet 3   aspirin EC 81 MG tablet  Take 81 mg by mouth daily. Swallow whole.     blood glucose meter kit and supplies Dispense based on patient and insurance preference. Use up to four times daily as directed. (FOR ICD-10 E10.9, E11.9). check blood glucose fasting and before meals four times daily. 1 each 0   Cholecalciferol (VITAMIN D3 PO) Take 4,000 Units by mouth.     clonazePAM (KLONOPIN) 0.5 MG tablet Take 1 tablet (0.5 mg total) by mouth at bedtime as needed for anxiety. 30 tablet 1   Coenzyme Q10 (CO Q-10) 400 MG CAPS Take by mouth daily at 6 (six) AM.     desoximetasone (TOPICORT) 0.25 % cream Apply 1 application  topically 2 (two) times daily.     escitalopram (LEXAPRO) 20 MG tablet Take 1 tablet (20 mg total) by mouth daily. 90 tablet 1   gabapentin (NEURONTIN) 300 MG capsule Take 1 capsule (300 mg total) by mouth 2 (two) times daily AND 2 capsules (600 mg total) at bedtime. 360 capsule 1   lovastatin (MEVACOR) 40 MG tablet Take 40 mg by mouth at bedtime.     nitroGLYCERIN (NITROSTAT) 0.4 MG SL tablet Place 1 tablet (0.4 mg total) under the tongue every 5 (five) minutes x 3 doses as needed for chest pain. 25 tablet 1   Omega-3 Fatty Acids (FISH OIL) 1200 MG CAPS Take by mouth daily at 6 (six) AM.     ticagrelor (BRILINTA) 90 MG TABS tablet Take 1 tablet (90 mg total) by mouth 2 (two) times daily. 60 tablet 11   torsemide (DEMADEX) 20 MG tablet Take 1 tablet (20 mg total) by mouth daily as needed. 30 tablet 3   Budeson-Glycopyrrol-Formoterol (BREZTRI AEROSPHERE) 160-9-4.8 MCG/ACT AERO Inhale 2 puffs into the lungs 2 (two) times daily. (Patient not taking: Reported on 02/12/2021) 10.7 g 0   Budeson-Glycopyrrol-Formoterol (BREZTRI AEROSPHERE) 160-9-4.8 MCG/ACT AERO Inhale 2 puffs into the lungs in the morning and at bedtime. (Patient not taking: Reported on 02/12/2021) 1 g 5   No facility-administered medications prior to visit.     Review of Systems:   Constitutional:   No  weight loss, night sweats,  Fevers, chills,  +fatigue, or  lassitude.  HEENT:   No headaches,  Difficulty swallowing,  Tooth/dental problems, or  Sore throat,                No sneezing, itching, ear ache, nasal congestion, post nasal drip,   CV:  No chest pain,  Orthopnea, PND, swelling in lower extremities, anasarca, dizziness, palpitations, syncope.   GI  No heartburn, indigestion, abdominal pain, nausea, vomiting, diarrhea, change in bowel habits, loss of appetite, bloody stools.   Resp:   No chest wall deformity  Skin: no rash or lesions.  GU: no dysuria, change in color of urine, no urgency or frequency.  No flank pain,  no hematuria   MS:  No joint pain or swelling.  No decreased range of motion.  No back pain.    Physical Exam  BP 124/80 (BP Location: Left Arm, Patient Position: Sitting, Cuff Size: Large)   Pulse 73   Temp (!) 97.1 F (36.2 C) (Oral)   Ht 5' 9"  (1.753 m)   Wt 263 lb 6.4 oz (119.5 kg)   SpO2 95%   BMI 38.90 kg/m   GEN: A/Ox3; pleasant , NAD, well nourished    HEENT:  [place]/AT,  NOSE-clear, THROAT-clear, no lesions, no postnasal drip or exudate noted.   NECK:  Supple w/ fair ROM; no  JVD; normal carotid impulses w/o bruits; no thyromegaly or nodules palpated; no lymphadenopathy.    RESP  Clear  P & A; w/o, wheezes/ rales/ or rhonchi. no accessory muscle use, no dullness to percussion  CARD:  RRR, no m/r/g, tr  peripheral edema, pulses intact, no cyanosis or clubbing.  GI:   Soft & nt; nml bowel sounds; no organomegaly or masses detected.   Musco: Warm bil, no deformities or joint swelling noted.   Neuro: alert, no focal deficits noted.    Skin: Warm, no lesions or rashes    Lab Results:     BNP    Component Value Date/Time   BNP 372.9 (H) 01/26/2021 1442    ProBNP No results found for: PROBNP  Imaging: DG Chest 2 View  Result Date: 01/28/2021 CLINICAL DATA:  75 year old male with history of shortness of breath and elevated D-dimer. EXAM: CHEST - 2 VIEW COMPARISON:  Chest x-ray 01/21/2021. FINDINGS: Chronic pleuroparenchymal scarring in the right mid to lower hemithorax with small chronic right pleural effusion, similar to the prior study. Left lung is clear. No left pleural effusion. Diffuse peribronchial cuffing. Cephalization of the pulmonary vasculature, without frank pulmonary edema. Heart size is mildly enlarged. Upper mediastinal contours are within normal limits. Aortic atherosclerosis. Status post median sternotomy. IMPRESSION: 1. Radiographic appearance the chest is very similar to the prior study, as detailed above. 2. Aortic atherosclerosis. 3. Mild  cardiomegaly with pulmonary venous congestion, but no frank pulmonary edema. Electronically Signed   By: Vinnie Langton M.D.   On: 01/28/2021 15:55   DG Chest 2 View  Result Date: 01/22/2021 CLINICAL DATA:  Dyspnea. EXAM: CHEST - 2 VIEW COMPARISON:  12/09/2020 FINDINGS: 1135 hours. Low volume film with right base atelectasis or scarring, similar to prior. Small right pleural effusion/pleural scarring is stable. Left lung clear. The cardio pericardial silhouette is enlarged. The visualized bony structures of the thorax show no acute abnormality. IMPRESSION: Stable exam.  No new or progressive findings. Electronically Signed   By: Misty Stanley M.D.   On: 01/22/2021 11:22   NM Pulmonary Perfusion  Result Date: 01/28/2021 CLINICAL DATA:  Elevated D-dimer, shortness of breath EXAM: NUCLEAR MEDICINE PERFUSION LUNG SCAN TECHNIQUE: Perfusion images were obtained in multiple projections after intravenous injection of radiopharmaceutical. Ventilation scans intentionally deferred if perfusion scan and chest x-ray adequate for interpretation during COVID 19 epidemic. RADIOPHARMACEUTICALS:  4.05 mCi Tc-70mMAA IV COMPARISON:  Chest radiograph done on 01/28/2021 FINDINGS: There are no segmental or subsegmental wedge-shaped perfusion defects. Decreased uptake in the right lower lung fields seen in the anterior image corresponds to infiltrate in right lower lung fields seen in the chest radiograph. IMPRESSION: There are no signs of pulmonary embolism. There are no focal wedge-shaped segmental or subsegmental perfusion defects. Electronically Signed   By: PElmer PickerM.D.   On: 01/28/2021 12:44      PFT Results Latest Ref Rng & Units 01/22/2021  FVC-Pre L 2.05  FVC-Predicted Pre % 49  FVC-Post L 1.74  FVC-Predicted Post % 42  Pre FEV1/FVC % % 75  Post FEV1/FCV % % 83  FEV1-Pre L 1.54  FEV1-Predicted Pre % 51  FEV1-Post L 1.44  DLCO uncorrected ml/min/mmHg 13.43  DLCO UNC% % 54  DLVA Predicted %  103  TLC L 3.43  TLC % Predicted % 50  RV % Predicted % 72    No results found for: NITRICOXIDE      Assessment & Plan:   Chronic obstructive pulmonary disease ([place]  Improved symptom control on BREZRI  Patient assistance paperwork for [place]  Patient Instructions  BREZTRI 2 puffs Twice daily  , rinse after use  BREZTRI patient assistance paperwork .  Albuterol inhaler As needed   Set up for Split night sleep study  Follow up with Dr. Halford Chessman  or Kilian Schwartz NP in 6 weeks . and As needed   Please contact office for sooner follow up if symptoms do not improve or worsen or seek emergency care         Acute on chronic diastolic CHF (congestive heart failure) (HCC) Improved with gentle diuresis  Continue on current regimen and follow up with Cardiology   Plan  Patient Instructions  BREZTRI 2 puffs Twice daily  , rinse after use  BREZTRI patient assistance paperwork .  Albuterol inhaler As needed   Set up for Split night sleep study  Follow up with Dr. Halford Chessman  or Anayla Giannetti NP in 6 weeks . and As needed   Please contact office for sooner follow up if symptoms do not improve or worsen or seek emergency care         Daytime sleepiness Snoring , daytime sleepiness, fatigue suspicious for OSA , refer for in lab split night sleep study  Healthy sleep regimen discussed      Rexene Edison, NP 02/12/2021

## 2021-02-12 NOTE — Assessment & Plan Note (Signed)
Snoring , daytime sleepiness, fatigue suspicious for OSA , refer for in lab split night sleep study  Healthy sleep regimen discussed

## 2021-02-12 NOTE — Assessment & Plan Note (Signed)
Improved with gentle diuresis  Continue on current regimen and follow up with Cardiology   Plan  Patient Instructions  BREZTRI 2 puffs Twice daily  , rinse after use  BREZTRI patient assistance paperwork .  Albuterol inhaler As needed   Set up for Split night sleep study  Follow up with Dr. Halford Chessman  or Alania Overholt NP in 6 weeks . and As needed   Please contact office for sooner follow up if symptoms do not improve or worsen or seek emergency care

## 2021-02-12 NOTE — Telephone Encounter (Signed)
Pt assistance paper work for Tony Williams is currently being filled out by pt. Will fax over paper work when pt returns paper work to clinic.

## 2021-02-12 NOTE — Assessment & Plan Note (Signed)
Improved symptom control on BREZRI  Patient assistance paperwork for [place]  Patient Instructions  BREZTRI 2 puffs Twice daily  , rinse after use  BREZTRI patient assistance paperwork .  Albuterol inhaler As needed   Set up for Split night sleep study  Follow up with Dr. Halford Chessman  or Elizibeth Breau NP in 6 weeks . and As needed   Please contact office for sooner follow up if symptoms do not improve or worsen or seek emergency care

## 2021-02-12 NOTE — Telephone Encounter (Signed)
He should not be taking more than prescribed.  He was prescribed enough to last him. He needs to ration what he has until appointment.

## 2021-02-12 NOTE — Patient Instructions (Addendum)
BREZTRI 2 puffs Twice daily  , rinse after use  BREZTRI patient assistance paperwork .  Albuterol inhaler As needed   Set up for Split night sleep study  Follow up with Dr. Halford Chessman  or Maggy Wyble NP in 6 weeks . and As needed   Please contact office for sooner follow up if symptoms do not improve or worsen or seek emergency care

## 2021-02-13 NOTE — Telephone Encounter (Signed)
Patient advised.

## 2021-02-13 NOTE — Progress Notes (Signed)
Reviewed and agree with assessment/plan.   Chesley Mires, MD [facility] Pulmonary/Critical Care 02/13/2021, 7:16 AM Pager:  [phone]

## 2021-02-13 NOTE — Telephone Encounter (Signed)
Pt will be bringing forms in on Monday. Will fax forms over when received.

## 2021-02-16 NOTE — Telephone Encounter (Signed)
To prevent delay in care, pharmay team submitted Patient Assistance Application to [employer] for  [facility]  along with provider portion, insurance card copy, patient portion. Routing to triage for f/u. Pharmacy team will be unable to f/u on inhaler PAP  Copy of application sent to scan center and in media tab of patient's char tif needed  Fax# [phone] Phone# [phone]  Knox Saliva, PharmD, MPH, BCPS Clinical Pharmacist (Rheumatology and Pulmonology)

## 2021-02-16 NOTE — Telephone Encounter (Signed)
Pt brought [employer] forms to the clinic faxing over to [facility] pharmacy team, nothing further needed.

## 2021-02-18 ENCOUNTER — Ambulatory Visit (INDEPENDENT_AMBULATORY_CARE_PROVIDER_SITE_OTHER): Payer: Medicare Other | Admitting: Family Medicine

## 2021-02-18 ENCOUNTER — Other Ambulatory Visit: Payer: Self-pay

## 2021-02-18 ENCOUNTER — Encounter: Payer: Self-pay | Admitting: Family Medicine

## 2021-02-18 DIAGNOSIS — R400 Somnolence: Secondary | ICD-10-CM | POA: Diagnosis not present

## 2021-02-18 DIAGNOSIS — F411 Generalized anxiety disorder: Secondary | ICD-10-CM | POA: Diagnosis not present

## 2021-02-18 DIAGNOSIS — F5104 Psychophysiologic insomnia: Secondary | ICD-10-CM

## 2021-02-18 MED ORDER — BUSPIRONE HCL 15 MG PO TABS: 15.0000 mg | ORAL_TABLET | Freq: Two times a day (BID) | ORAL | 1 refills | Status: DC

## 2021-02-18 MED ORDER — CLONAZEPAM 0.5 MG PO TABS: 0.5000 mg | ORAL_TABLET | Freq: Every evening | ORAL | 0 refills | Status: DC | PRN

## 2021-02-18 MED ORDER — DULOXETINE HCL 30 MG PO CPEP: 30.0000 mg | ORAL_CAPSULE | Freq: Two times a day (BID) | ORAL | 1 refills | Status: DC

## 2021-02-18 NOTE — Progress Notes (Signed)
Virtual telephone visit    Virtual Visit via Telephone Note   This visit type was conducted due to national recommendations for restrictions regarding the COVID-19 Pandemic (e.g. social distancing) in an effort to limit this patient's exposure and mitigate transmission in our community. Due to his co-morbid illnesses, this patient is at least at moderate risk for complications without adequate follow up. This format is felt to be most appropriate for this patient at this time. The patient did not have access to video technology or had technical difficulties with video requiring transitioning to audio format only (telephone). Physical exam was limited to content and character of the telephone converstion.    Patient location: home Provider location: [facility]  I discussed the limitations of evaluation and management by telemedicine and the availability of in person appointments. The patient expressed understanding and agreed to proceed.   Visit Date: 02/18/2021  Today's healthcare provider: Gwyneth Sprout, FNP   Chief Complaint  Patient presents with   Anxiety   Subjective    Patient is changing medication dosing based on his symptoms- has been taking 30m of PRN medication to assist with sleep; and wishes for this to be increased as he will continue to lie in bed from 11p-1a awake feeling 'antsy'. Pt naps throughout the nap, up to 1.5 hours. Thorough discussion of risk vs benefit of continued use of medication and going back. Patient asking to stop current treatment plan and go back on his 'pain pills' and his sleeping pills. Referral to pysch placed; however, patient stated that he did not want to hear what they had to say and that he wanted to be on original pain because he's been 'fine for 1 month' and he 'won't fall'. Advised pt that his PCP will be advised.   Anxiety, Follow-up  He was last seen for anxiety 1 months ago. Changes made at last visit include start low-dose clonazepam for  sleep and discontinue his trazodone.   He reports good compliance with treatment. Patient reports that he has been taking two tablets at night instead of one tablet.  He reports excellent tolerance of treatment. He is not having side effects.   He feels his anxiety is severe and Worse since last visit.  Symptoms: No chest pain Yes difficulty concentrating  No dizziness No fatigue  No feelings of losing control Yes insomnia  Yes irritable No palpitations  No panic attacks No racing thoughts  No shortness of breath No sweating  No tremors/shakes    GAD-7 Results GAD-7 Generalized Anxiety Disorder Screening Tool 01/29/2021  1. Feeling Nervous, Anxious, or on Edge 3  2. Not Being Able to Stop or Control Worrying 3  3. Worrying Too Much About Different Things 3  4. Trouble Relaxing 3  5. Being So Restless it's Hard To Sit Still 3  6. Becoming Easily Annoyed or Irritable 3  7. Feeling Afraid As If Something Awful Might Happen 0  Total GAD-7 Score 18  Difficulty At Work, Home, or Getting  Along With Others? Somewhat difficult    PHQ-9 Scores PHQ9 SCORE ONLY 01/29/2021 10/21/2020 05/13/2020  PHQ-9 Total Score 13 2 0    ---------------------------------------------------------------------------------------------------     Medications: Outpatient Medications Prior to Visit  Medication Sig   albuterol (VENTOLIN HFA) 108 (90 Base) MCG/ACT inhaler Inhale 2 puffs into the lungs every 6 (six) hours as needed for wheezing or shortness of breath.   allopurinol (ZYLOPRIM) 300 MG tablet Take 1 tablet (300 mg total) by  mouth daily.   aspirin EC 81 MG tablet Take 81 mg by mouth daily. Swallow whole.   blood glucose meter kit and supplies Dispense based on patient and insurance preference. Use up to four times daily as directed. (FOR ICD-10 E10.9, E11.9). check blood glucose fasting and before meals four times daily.   Budeson-Glycopyrrol-Formoterol (BREZTRI AEROSPHERE) 160-9-4.8 MCG/ACT AERO  Inhale 2 puffs into the lungs 2 (two) times daily.   Budeson-Glycopyrrol-Formoterol (BREZTRI AEROSPHERE) 160-9-4.8 MCG/ACT AERO Inhale 2 puffs into the lungs in the morning and at bedtime.   Budeson-Glycopyrrol-Formoterol (BREZTRI AEROSPHERE) 160-9-4.8 MCG/ACT AERO Inhale 2 puffs into the lungs in the morning and at bedtime.   Budeson-Glycopyrrol-Formoterol (BREZTRI AEROSPHERE) 160-9-4.8 MCG/ACT AERO Inhale 2 puffs into the lungs in the morning and at bedtime.   Cholecalciferol (VITAMIN D3 PO) Take 4,000 Units by mouth.   Coenzyme Q10 (CO Q-10) 400 MG CAPS Take by mouth daily at 6 (six) AM.   desoximetasone (TOPICORT) 0.25 % cream Apply 1 application topically 2 (two) times daily.   escitalopram (LEXAPRO) 20 MG tablet Take 1 tablet (20 mg total) by mouth daily.   gabapentin (NEURONTIN) 300 MG capsule Take 1 capsule (300 mg total) by mouth 2 (two) times daily AND 2 capsules (600 mg total) at bedtime.   lovastatin (MEVACOR) 40 MG tablet Take 40 mg by mouth at bedtime.   nitroGLYCERIN (NITROSTAT) 0.4 MG SL tablet Place 1 tablet (0.4 mg total) under the tongue every 5 (five) minutes x 3 doses as needed for chest pain.   Omega-3 Fatty Acids (FISH OIL) 1200 MG CAPS Take by mouth daily at 6 (six) AM.   ticagrelor (BRILINTA) 90 MG TABS tablet Take 1 tablet (90 mg total) by mouth 2 (two) times daily.   torsemide (DEMADEX) 20 MG tablet Take 1 tablet (20 mg total) by mouth daily as needed.   [DISCONTINUED] clonazePAM (KLONOPIN) 0.5 MG tablet Take 1 tablet (0.5 mg total) by mouth at bedtime as needed for anxiety.   No facility-administered medications prior to visit.    Review of Systems  Constitutional:  Positive for fatigue.  Psychiatric/Behavioral:  Positive for sleep disturbance. Negative for suicidal ideas.      Objective    There were no vitals taken for this visit.     Assessment & Plan     Problem List Items Addressed This Visit       Other   GAD (generalized anxiety disorder)    Relevant Medications   busPIRone (BUSPAR) 15 MG tablet   DULoxetine (CYMBALTA) 30 MG capsule   Other Relevant Orders   Ambulatory referral to Psychiatry   AMB Referral to [facility]   Psychophysiological insomnia   Relevant Orders   PSG Sleep Study   Ambulatory referral to Psychiatry   AMB Referral to [facility]   Daytime sleepiness   Relevant Orders   PSG Sleep Study     Return in about 4 weeks (around 03/18/2021) for anxiety and depression.    I discussed the assessment and treatment plan with the patient. The patient was provided an opportunity to ask questions and all were answered. The patient agreed with the plan and demonstrated an understanding of the instructions.   The patient was advised to call back or seek an in-person evaluation if the symptoms worsen or if the condition fails to improve as anticipated.  I provided 9 minutes of non-face-to-face time during this encounter.  Vonna Kotyk, FNP, have reviewed all documentation for this  visit. The documentation on 02/18/21 for the exam, diagnosis, procedures, and orders are all accurate and complete.   Gwyneth Sprout, [place] [phone] (phone) [phone] (fax)  [place]

## 2021-02-19 ENCOUNTER — Telehealth: Payer: Self-pay | Admitting: *Deleted

## 2021-02-19 NOTE — Chronic Care Management (AMB) (Signed)
  Chronic Care Management   Note  02/19/2021 Name: Spurgeon Gancarz MRN: [id number] DOB: 06/22/1945  Linden Tagliaferro is a 75 y.o. year old male who is a primary care patient of Brita Romp, Dionne Bucy, MD. I reached out to [name] by phone today in response to a referral sent by Mr. Dewel Quarry's PCP.  Mr. Vossler was given information about Chronic Care Management services today including:  CCM service includes personalized support from designated clinical staff supervised by his physician, including individualized plan of care and coordination with other care providers 24/7 contact phone numbers for assistance for urgent and routine care needs. Service will only be billed when office clinical staff spend 20 minutes or more in a month to coordinate care. Only one practitioner may furnish and bill the service in a calendar month. The patient may stop CCM services at any time (effective at the end of the month) by phone call to the office staff. The patient is responsible for co-pay (up to 20% after annual deductible is met) if co-pay is required by the individual health plan.   Patient agreed to services and verbal consent obtained.   Follow up plan: Telephone appointment with care management team member scheduled for: 02/26/2021  Julian Hy, [place] Management  Direct Dial: [phone]

## 2021-02-26 ENCOUNTER — Ambulatory Visit (INDEPENDENT_AMBULATORY_CARE_PROVIDER_SITE_OTHER): Payer: Medicare Other | Admitting: *Deleted

## 2021-02-26 DIAGNOSIS — E1159 Type 2 diabetes mellitus with other circulatory complications: Secondary | ICD-10-CM

## 2021-02-26 DIAGNOSIS — F5104 Psychophysiologic insomnia: Secondary | ICD-10-CM

## 2021-02-26 DIAGNOSIS — F411 Generalized anxiety disorder: Secondary | ICD-10-CM

## 2021-02-26 NOTE — Patient Instructions (Signed)
Visit Information  Thank you for taking time to visit with me today. Please don't hesitate to contact me if I can be of assistance to you before our next scheduled telephone appointment.  Following are the goals we discussed today:   - begin personal counseling - practice relaxation or meditation daily - talk about feelings with a friend, family or spiritual advisor - practice positive thinking and self-talk  -Follow with [facility]-complete intake and return    Our next appointment is by telephone on 03/19/21 at 2pm  Please call the care guide team at [phone] if you need to cancel or reschedule your appointment.   If you are experiencing a Mental Health or [place] or need someone to talk to, please call the Suicide and Crisis Lifeline: 25   Following is a copy of your full plan of care:  Care Plan : General Social Work (Adult)  Updates made by [employer], Darla Lesches, LCSW since 02/26/2021 12:00 AM     Problem: CHL AMB "PATIENT-SPECIFIC PROBLEM"   Note:   CARE PLAN ENTRY (see longitudinal plan of care for additional care plan information)  Current Barriers:  Knowledge deficits related to accessing mental health provider in patient with Anxiety  Patient is experiencing symptoms of  anxiety evidenced by difficulty sleeping, mind racing-specifically at night, patient would like to continue Klonopin and is requesting dosage increase Patient agreeable to follow up with a psychiatrist however has declined follow up with a therapist at this time Patient needs Support, Education, and Care Coordination in order to meet unmet mental health needs  Mental Health Concerns   Clinical Social Work Goal(s):  Over the next 90 days, patient will work with SW bi-weekly by telephone or in person to reduce or manage symptoms of anxiety until connected for ongoing counseling resources.  Patient will implement clinical interventions discussed today to decreases  symptoms of anxiety and increase knowledge and/or ability of: self-management skills.  Interventions:  Assessed patient's understanding, education, previous treatment and care coordination needs  Patient interviewed and appropriate assessments performed: PHQ 2 PHQ 9 Provided basic mental health support, education and interventions  Collaborated with appropriate clinical care team members regarding patient needs Discussed several options for long term counseling based on need and insurance. Reviewed mental health medications with patient prescribed by PCP and discussed compliance  Other interventions include: Motivational Interviewing employed Participation in counseling encouraged to develop coping strategies for anxiety Increase in activities / exercise encouraged as well as meditation/deep breathing Verbalization of feelings encouraged    Patient Self Care Activities & Deficits:  Patient is unable to independently navigate community resource options without care coordination support Self administers medications as prescribed Attends all scheduled provider appointments Performs ADL's independently Performs IADL's independently Strong family or social support  Initial goal documentation      Mr. Arizmendi was given information about Care Management services by the embedded care coordination team including:  Care Management services include personalized support from designated clinical staff supervised by his physician, including individualized plan of care and coordination with other care providers 24/7 contact phone numbers for assistance for urgent and routine care needs. The patient may stop CCM services at any time (effective at the end of the month) by phone call to the office staff.  Patient agreed to services and verbal consent obtained.   The patient verbalized understanding of instructions, educational materials, and care plan provided today and declined offer to receive  copy of patient instructions, educational materials,  and care plan.   Telephone follow up appointment with care management team member scheduled for: 03/19/21   Elliot Gurney, [place] Worker  [facility] [phone]

## 2021-02-26 NOTE — Chronic Care Management (AMB) (Signed)
Chronic Care Management    Clinical Social Work Note  02/26/2021 Name: Tony Williams MRN: [id number] DOB: [birth year]  Tony Williams is a 75 y.o. year old male who is a primary care patient of Bacigalupo, Dionne Bucy, MD. The CCM team was consulted to assist the patient with chronic disease management and/or care coordination needs related to: Mental Health Counseling and Resources.   Engaged with patient by telephone for follow up visit in response to provider referral for social work chronic care management and care coordination services.   Consent to Services:  The patient was given the following information about Chronic Care Management services today, agreed to services, and gave verbal consent: 1. CCM service includes personalized support from designated clinical staff supervised by the primary care provider, including individualized plan of care and coordination with other care providers 2. 24/7 contact phone numbers for assistance for urgent and routine care needs. 3. Service will only be billed when office clinical staff spend 20 minutes or more in a month to coordinate care. 4. Only one practitioner may furnish and bill the service in a calendar month. 5.The patient may stop CCM services at any time (effective at the end of the month) by phone call to the office staff. 6. The patient will be responsible for cost sharing (co-pay) of up to 20% of the service fee (after annual deductible is met). Patient agreed to services and consent obtained.  Patient agreed to services and consent obtained.   Assessment: Review of patient past medical history, allergies, medications, and health status, including review of relevant consultants reports was performed today as part of a comprehensive evaluation and provision of chronic care management and care coordination services.     SDOH (Social Determinants of Health) assessments and interventions performed:  SDOH Interventions    Flowsheet Row Most  Recent Value  SDOH Interventions   Depression Interventions/Treatment  Referral to Psychiatry, Counseling        Advanced Directives Status: Not addressed in this encounter.  CCM Care Plan  Allergies  Allergen Reactions   Butorphanol Itching   Morphine Other (See Comments)    Redness around IV site   Pedi-Pre Tape Spray [Wound Dressing Adhesive] Itching   Simvastatin Other (See Comments)    "Destroyed my muscles"   Statins Other (See Comments)    "Destroyed my muscles"    Outpatient Encounter Medications as of 02/26/2021  Medication Sig   albuterol (VENTOLIN HFA) 108 (90 Base) MCG/ACT inhaler Inhale 2 puffs into the lungs every 6 (six) hours as needed for wheezing or shortness of breath.   allopurinol (ZYLOPRIM) 300 MG tablet Take 1 tablet (300 mg total) by mouth daily.   aspirin EC 81 MG tablet Take 81 mg by mouth daily. Swallow whole.   blood glucose meter kit and supplies Dispense based on patient and insurance preference. Use up to four times daily as directed. (FOR ICD-10 E10.9, E11.9). check blood glucose fasting and before meals four times daily.   Budeson-Glycopyrrol-Formoterol (BREZTRI AEROSPHERE) 160-9-4.8 MCG/ACT AERO Inhale 2 puffs into the lungs 2 (two) times daily.   Budeson-Glycopyrrol-Formoterol (BREZTRI AEROSPHERE) 160-9-4.8 MCG/ACT AERO Inhale 2 puffs into the lungs in the morning and at bedtime.   Budeson-Glycopyrrol-Formoterol (BREZTRI AEROSPHERE) 160-9-4.8 MCG/ACT AERO Inhale 2 puffs into the lungs in the morning and at bedtime.   Budeson-Glycopyrrol-Formoterol (BREZTRI AEROSPHERE) 160-9-4.8 MCG/ACT AERO Inhale 2 puffs into the lungs in the morning and at bedtime.   busPIRone (BUSPAR) 15 MG tablet Take 1 tablet (15 mg  total) by mouth 2 (two) times daily.   Cholecalciferol (VITAMIN D3 PO) Take 4,000 Units by mouth.   clonazePAM (KLONOPIN) 0.5 MG tablet Take 1 tablet (0.5 mg total) by mouth at bedtime as needed for anxiety.   Coenzyme Q10 (CO Q-10) 400 MG CAPS  Take by mouth daily at 6 (six) AM.   desoximetasone (TOPICORT) 0.25 % cream Apply 1 application topically 2 (two) times daily.   DULoxetine (CYMBALTA) 30 MG capsule Take 1 capsule (30 mg total) by mouth 2 (two) times daily.   escitalopram (LEXAPRO) 20 MG tablet Take 1 tablet (20 mg total) by mouth daily.   gabapentin (NEURONTIN) 300 MG capsule Take 1 capsule (300 mg total) by mouth 2 (two) times daily AND 2 capsules (600 mg total) at bedtime.   lovastatin (MEVACOR) 40 MG tablet Take 40 mg by mouth at bedtime.   nitroGLYCERIN (NITROSTAT) 0.4 MG SL tablet Place 1 tablet (0.4 mg total) under the tongue every 5 (five) minutes x 3 doses as needed for chest pain.   Omega-3 Fatty Acids (FISH OIL) 1200 MG CAPS Take by mouth daily at 6 (six) AM.   ticagrelor (BRILINTA) 90 MG TABS tablet Take 1 tablet (90 mg total) by mouth 2 (two) times daily.   torsemide (DEMADEX) 20 MG tablet Take 1 tablet (20 mg total) by mouth daily as needed.   No facility-administered encounter medications on file as of 02/26/2021.    Patient Active Problem List   Diagnosis Date Noted   Daytime sleepiness 02/12/2021   Hospital discharge follow-up 12/16/2020   Idiopathic hypotension 12/16/2020   Hx of myocardial infarction 12/16/2020   Bradycardia 12/16/2020   Dizziness 12/16/2020   Hyperkalemia    Symptomatic sinus bradycardia 12/08/2020   Dyspnea 11/15/2020   Acute on chronic diastolic CHF (congestive heart failure) ([place]) 11/10/2020   NSTEMI (non-ST elevated myocardial infarction) ([place]) 11/08/2020   Positive colorectal cancer screening using Cologuard test    Polyp of transverse colon    Morbid obesity ([place]) 07/21/2020   Chronic pain of both knees 07/21/2020   Diabetic polyneuropathy associated with type 2 diabetes mellitus ([place]) 07/21/2020   Stage 3a chronic kidney disease ([place]) 07/21/2020   Chronic gout due to renal impairment without tophus 07/21/2020   Hypertension associated with diabetes ([place]) 07/21/2020    Hyperlipidemia associated with type 2 diabetes mellitus ([place]) 07/21/2020   GAD (generalized anxiety disorder) 07/21/2020   Psychophysiological insomnia 07/21/2020   Screening for blood or protein in urine 04/18/2020   Vitamin D insufficiency 04/18/2020   Chronic obstructive pulmonary disease ([place]) 04/18/2020   Controlled type 2 diabetes mellitus with diabetic polyneuropathy, without long-term current use of insulin ([place]) 04/18/2020   Coronary artery disease with hx of myocardial infarct w/o hx of CABG 04/18/2020   S/P primary angioplasty with coronary stent 04/18/2020   History of smoking at least 1 pack per day for at least 30 years 04/18/2020    Conditions to be addressed/monitored: Anxiety and Psychophysiological Insomnia ; Mental Health Concerns   Care Plan : General Social Work (Adult)  Updates made by Vern Claude, LCSW since 02/26/2021 12:00 AM     Problem: CHL AMB "PATIENT-SPECIFIC PROBLEM"   Note:   CARE PLAN ENTRY (see longitudinal plan of care for additional care plan information)  Current Barriers:  Knowledge deficits related to accessing mental health provider in patient with Anxiety  Patient is experiencing symptoms of  anxiety evidenced by difficulty sleeping, mind racing-specifically at night, patient would like to continue  Klonopin and is requesting dosage increase Patient agreeable to follow up with a psychiatrist however has declined follow up with a therapist at this time Patient needs Support, Education, and Care Coordination in order to meet unmet mental health needs  Mental Health Concerns   Clinical Social Work Goal(s):  Over the next 90 days, patient will work with SW bi-weekly by telephone or in person to reduce or manage symptoms of anxiety until connected for ongoing counseling resources.  Patient will implement clinical interventions discussed today to decreases symptoms of anxiety and increase knowledge and/or ability of: self-management  skills.  Interventions:  Assessed patient's understanding, education, previous treatment and care coordination needs  Patient interviewed and appropriate assessments performed: PHQ 2 PHQ 9 Provided basic mental health support, education and interventions  Collaborated with appropriate clinical care team members regarding patient needs Discussed several options for long term counseling based on need and insurance. Reviewed mental health medications with patient prescribed by PCP and discussed compliance  Other interventions include: Motivational Interviewing employed Participation in counseling encouraged to develop coping strategies for anxiety Increase in activities / exercise encouraged as well as meditation/deep breathing Verbalization of feelings encouraged    Patient Self Care Activities & Deficits:  Patient is unable to independently navigate community resource options without care coordination support Self administers medications as prescribed Attends all scheduled provider appointments Performs ADL's independently Performs IADL's independently Strong family or social support  Initial goal documentation       Follow Up Plan: SW will follow up with patient by phone over the next 14 business days       [place], [place] Worker  [place] Care Management [phone]

## 2021-03-11 ENCOUNTER — Other Ambulatory Visit: Payer: Self-pay

## 2021-03-11 ENCOUNTER — Encounter: Payer: Self-pay | Admitting: Emergency Medicine

## 2021-03-11 ENCOUNTER — Emergency Department
Admission: EM | Admit: 2021-03-11 | Discharge: 2021-03-11 | Disposition: A | Discharge status: Home / Self Care | Payer: Medicare Other | Attending: Emergency Medicine | Admitting: Emergency Medicine

## 2021-03-11 ENCOUNTER — Emergency Department: Payer: Medicare Other

## 2021-03-11 ENCOUNTER — Ambulatory Visit: Payer: Self-pay

## 2021-03-11 ENCOUNTER — Ambulatory Visit: Payer: Medicare Other | Admitting: Primary Care

## 2021-03-11 DIAGNOSIS — I130 Hypertensive heart and chronic kidney disease with heart failure and stage 1 through stage 4 chronic kidney disease, or unspecified chronic kidney disease: Secondary | ICD-10-CM | POA: Diagnosis not present

## 2021-03-11 DIAGNOSIS — R001 Bradycardia, unspecified: Secondary | ICD-10-CM | POA: Diagnosis not present

## 2021-03-11 DIAGNOSIS — Z951 Presence of aortocoronary bypass graft: Secondary | ICD-10-CM | POA: Insufficient documentation

## 2021-03-11 DIAGNOSIS — S060X9D Concussion with loss of consciousness of unspecified duration, subsequent encounter: Secondary | ICD-10-CM | POA: Diagnosis not present

## 2021-03-11 DIAGNOSIS — I5033 Acute on chronic diastolic (congestive) heart failure: Secondary | ICD-10-CM | POA: Insufficient documentation

## 2021-03-11 DIAGNOSIS — W1839XA Other fall on same level, initial encounter: Secondary | ICD-10-CM | POA: Insufficient documentation

## 2021-03-11 DIAGNOSIS — N1832 Chronic kidney disease, stage 3b: Secondary | ICD-10-CM | POA: Insufficient documentation

## 2021-03-11 DIAGNOSIS — E1140 Type 2 diabetes mellitus with diabetic neuropathy, unspecified: Secondary | ICD-10-CM | POA: Insufficient documentation

## 2021-03-11 DIAGNOSIS — J449 Chronic obstructive pulmonary disease, unspecified: Secondary | ICD-10-CM | POA: Insufficient documentation

## 2021-03-11 DIAGNOSIS — Z7951 Long term (current) use of inhaled steroids: Secondary | ICD-10-CM | POA: Insufficient documentation

## 2021-03-11 DIAGNOSIS — Z79899 Other long term (current) drug therapy: Secondary | ICD-10-CM | POA: Diagnosis not present

## 2021-03-11 DIAGNOSIS — Z8551 Personal history of malignant neoplasm of bladder: Secondary | ICD-10-CM | POA: Insufficient documentation

## 2021-03-11 DIAGNOSIS — S0990XA Unspecified injury of head, initial encounter: Secondary | ICD-10-CM | POA: Diagnosis not present

## 2021-03-11 DIAGNOSIS — I2510 Atherosclerotic heart disease of native coronary artery without angina pectoris: Secondary | ICD-10-CM | POA: Diagnosis not present

## 2021-03-11 DIAGNOSIS — R110 Nausea: Secondary | ICD-10-CM | POA: Diagnosis not present

## 2021-03-11 DIAGNOSIS — R42 Dizziness and giddiness: Secondary | ICD-10-CM | POA: Diagnosis not present

## 2021-03-11 DIAGNOSIS — Z87891 Personal history of nicotine dependence: Secondary | ICD-10-CM | POA: Insufficient documentation

## 2021-03-11 DIAGNOSIS — F0781 Postconcussional syndrome: Secondary | ICD-10-CM | POA: Diagnosis not present

## 2021-03-11 DIAGNOSIS — E1122 Type 2 diabetes mellitus with diabetic chronic kidney disease: Secondary | ICD-10-CM | POA: Insufficient documentation

## 2021-03-11 DIAGNOSIS — R519 Headache, unspecified: Secondary | ICD-10-CM | POA: Diagnosis not present

## 2021-03-11 DIAGNOSIS — Z7982 Long term (current) use of aspirin: Secondary | ICD-10-CM | POA: Insufficient documentation

## 2021-03-11 DIAGNOSIS — Y92002 Bathroom of unspecified non-institutional (private) residence single-family (private) house as the place of occurrence of the external cause: Secondary | ICD-10-CM | POA: Diagnosis not present

## 2021-03-11 DIAGNOSIS — S0003XA Contusion of scalp, initial encounter: Secondary | ICD-10-CM | POA: Diagnosis not present

## 2021-03-11 DIAGNOSIS — Z7901 Long term (current) use of anticoagulants: Secondary | ICD-10-CM | POA: Diagnosis not present

## 2021-03-11 LAB — BASIC METABOLIC PANEL
Anion gap: 8 (ref 5–15)
BUN: 24 mg/dL — ABNORMAL HIGH (ref 8–23)
CO2: 25 mmol/L (ref 22–32)
Calcium: 9.4 mg/dL (ref 8.9–10.3)
Chloride: 101 mmol/L (ref 98–111)
Creatinine, Ser: 1.7 mg/dL — ABNORMAL HIGH (ref 0.61–1.24)
GFR, Estimated: 42 mL/min — ABNORMAL LOW (ref >60)
Glucose, Bld: 162 mg/dL — ABNORMAL HIGH (ref 70–99)
Potassium: 4.6 mmol/L (ref 3.5–5.1)
Sodium: 134 mmol/L — ABNORMAL LOW (ref 135–145)

## 2021-03-11 LAB — CBC
HCT: 46.5 % (ref 39.0–52.0)
Hemoglobin: 15.7 g/dL (ref 13.0–17.0)
MCH: 30.6 pg (ref 26.0–34.0)
MCHC: 33.8 g/dL (ref 30.0–36.0)
MCV: 90.6 fL (ref 80.0–100.0)
Platelets: 269 10*3/uL (ref 150–400)
RBC: 5.13 MIL/uL (ref 4.22–5.81)
RDW: 14.9 % (ref 11.5–15.5)
WBC: 11.9 10*3/uL — ABNORMAL HIGH (ref 4.0–10.5)
nRBC: 0 % (ref 0.0–0.2)

## 2021-03-11 MED ORDER — BUTALBITAL-APAP-CAFFEINE 50-325-40 MG PO TABS: 1.0000 | ORAL_TABLET | Freq: Four times a day (QID) | ORAL | 0 refills | Status: DC | PRN

## 2021-03-11 MED ORDER — ACETAMINOPHEN 500 MG PO TABS
1000.0000 mg | ORAL_TABLET | Freq: Once | ORAL | Status: DC
  Filled 2021-03-11: qty 2

## 2021-03-11 NOTE — ED Notes (Signed)
Patient discharged to home per MD order. Patient in stable condition, and deemed medically cleared by ED provider for discharge. Discharge instructions reviewed with patient/family using "Teach Back"; verbalized understanding of medication education and administration, and information about follow-up care. Denies further concerns. ° °

## 2021-03-11 NOTE — ED Triage Notes (Signed)
Presents with h/a  states he fell about 2-3 weeks ago  h/a worse today

## 2021-03-11 NOTE — ED Notes (Signed)
Pt states he tripped and fell on Dec 7th. Pt is on blood thinners. Pt states headache that started right after and has continued. Pt has black eye still visible to right eye. Pt states pain of 8/10 pain.  Pt states some nausea, dizziness and blurry vision.

## 2021-03-11 NOTE — ED Notes (Signed)
AAOx3.  Skin warm and dry.  C/O Headache x 3 weeks.  Reports a syncopal episode three weeks ago while on commode, fell forward hitting right orbital area.  Daughter has a picture after injury, and ecchymosis noted around right eye; none appreciated today.  MAE equally and strong, but states headache has been persistent x 3 weeks.  No aggravating or alleviating factors identified.

## 2021-03-11 NOTE — ED Provider Notes (Signed)
[facility] Emergency Department Provider Note   ____________________________________________   Event Date/Time   First MD Initiated Contact with Patient 03/11/21 1350     (approximate)  I have reviewed the triage vital signs and the nursing notes.   HISTORY  Chief Complaint Headache    HPI Tony Williams is a 75 y.o. male who presents for headache  LOCATION: Head, global DURATION: 2 weeks prior to arrival TIMING: Initially stable with worsening today SEVERITY: Severe QUALITY: Aching CONTEXT: Patient states that he had a fall in the bathroom 2 weeks ago striking the front right aspect of his head on a tile floor resulting in a large contusion as well as periorbital ecchymosis.  Patient is on Brilinta however did not seek emergency care or any follow-up at that time. MODIFYING FACTORS: Patient states that bright lights, loud noises, and watching TV worsens this headache and it is pressure relieved with OTC medications ASSOCIATED SYMPTOMS: Mild nausea, irritability, difficulty sleeping   Per medical record review, patient has history of depression/anxiety, COPD, insomnia, and CAD          Past Medical History:  Diagnosis Date   CAD S/P percutaneous coronary angioplasty    CAD, multiple vessel    Cancer ([place]) 2013   Bladder   Chronic kidney disease    COPD (chronic obstructive pulmonary disease) ([place])    Depression    Diabetes mellitus without complication ([place])    Heart problem    Hx of CABG x 4    LIMA-D2, SVG-OM (occluded), SeqSVG-D1-LAD.   Myocardial infarction [facility])     Patient Active Problem List   Diagnosis Date Noted   Daytime sleepiness 02/12/2021   Hospital discharge follow-up 12/16/2020   Idiopathic hypotension 12/16/2020   Hx of myocardial infarction 12/16/2020   Bradycardia 12/16/2020   Dizziness 12/16/2020   Hyperkalemia    Symptomatic sinus bradycardia 12/08/2020   Dyspnea 11/15/2020   Acute on chronic diastolic  CHF (congestive heart failure) ([place]) 11/10/2020   NSTEMI (non-ST elevated myocardial infarction) ([place]) 11/08/2020   Positive colorectal cancer screening using Cologuard test    Polyp of transverse colon    Morbid obesity ([place]) 07/21/2020   Chronic pain of both knees 07/21/2020   Diabetic polyneuropathy associated with type 2 diabetes mellitus ([place]) 07/21/2020   Stage 3a chronic kidney disease ([place]) 07/21/2020   Chronic gout due to renal impairment without tophus 07/21/2020   Hypertension associated with diabetes ([place]) 07/21/2020   Hyperlipidemia associated with type 2 diabetes mellitus ([place]) 07/21/2020   GAD (generalized anxiety disorder) 07/21/2020   Psychophysiological insomnia 07/21/2020   Screening for blood or protein in urine 04/18/2020   Vitamin D insufficiency 04/18/2020   Chronic obstructive pulmonary disease ([place]) 04/18/2020   Controlled type 2 diabetes mellitus with diabetic polyneuropathy, without long-term current use of insulin ([place]) 04/18/2020   Coronary artery disease with hx of myocardial infarct w/o hx of CABG 04/18/2020   S/P primary angioplasty with coronary stent 04/18/2020   History of smoking at least 1 pack per day for at least 30 years 04/18/2020    Past Surgical History:  Procedure Laterality Date   CHOLECYSTECTOMY, LAPAROSCOPIC  12/26/1995   COLONOSCOPY WITH PROPOFOL N/A 09/23/2020   Procedure: COLONOSCOPY WITH PROPOFOL;  Surgeon: Lucilla Lame, MD;  Location: [facility] ENDOSCOPY;  Service: Endoscopy;  Laterality: N/A;   CORONARY ARTERY BYPASS GRAFT     LIMA-D2, SVG-OM, SeqSVG-D1-LAD   CORONARY STENT INTERVENTION N/A 11/12/2020   Procedure: CORONARY STENT INTERVENTION;  Surgeon:  Wellington Hampshire, MD;  Location: [place] CV LAB;  Service: Cardiovascular;  Laterality: N/A;   EYE SURGERY     IABP INSERTION N/A 11/11/2020   Procedure: IABP Insertion;  Surgeon: Nelva Bush, MD;  Location: [place] CV LAB;  Service: Cardiovascular;  Laterality: N/A;   IR  STENT PLACEMENT ANTE CAROTID INC ANGIO     KNEE SURGERY     RIGHT/LEFT HEART CATH AND CORONARY/GRAFT ANGIOGRAPHY N/A 11/11/2020   Procedure: RIGHT/LEFT HEART CATH AND CORONARY/GRAFT ANGIOGRAPHY;  Surgeon: Nelva Bush, MD;  Location: [place] CV LAB;  Service: Cardiovascular;  Severe native CAD: 90% dLMCA-100% mLAD/100% ost LCx CTO w/ Severe diffuse RCA disease. Widely patent LIMA-D1. Patent sequential SVG-D2-LAD with 99% mid graft & 90% LIMA-D2 @ anastomosis. previously stented SVG-OM - CTO. Mildly   TRANSURETHRAL RESECTION OF PROSTATE  1994   URINARY SPHINCTER IMPLANT  1995    Prior to Admission medications   Medication Sig Start Date End Date Taking? Authorizing Provider  butalbital-acetaminophen-caffeine (FIORICET) 50-325-40 MG tablet Take 1-2 tablets by mouth every 6 (six) hours as needed for headache. 03/11/21 03/11/22 Yes Cashe Gatt, Vista Lawman, MD  albuterol (VENTOLIN HFA) 108 (90 Base) MCG/ACT inhaler Inhale 2 puffs into the lungs every 6 (six) hours as needed for wheezing or shortness of breath. 04/18/20   Flinchum, Kelby Aline, FNP  allopurinol (ZYLOPRIM) 300 MG tablet Take 1 tablet (300 mg total) by mouth daily. 07/21/20   Virginia Crews, MD  aspirin EC 81 MG tablet Take 81 mg by mouth daily. Swallow whole.    [provider]  blood glucose meter kit and supplies Dispense based on patient and insurance preference. Use up to four times daily as directed. (FOR ICD-10 E10.9, E11.9). check blood glucose fasting and before meals four times daily. 04/18/20   Flinchum, Kelby Aline, FNP  Budeson-Glycopyrrol-Formoterol (BREZTRI AEROSPHERE) 160-9-4.8 MCG/ACT AERO Inhale 2 puffs into the lungs 2 (two) times daily. 01/27/21   Parrett, Fonnie Mu, NP  Budeson-Glycopyrrol-Formoterol (BREZTRI AEROSPHERE) 160-9-4.8 MCG/ACT AERO Inhale 2 puffs into the lungs in the morning and at bedtime. 01/27/21   Parrett, Fonnie Mu, NP  Budeson-Glycopyrrol-Formoterol (BREZTRI AEROSPHERE) 160-9-4.8 MCG/ACT AERO  Inhale 2 puffs into the lungs in the morning and at bedtime. 02/12/21   Parrett, Fonnie Mu, NP  Budeson-Glycopyrrol-Formoterol (BREZTRI AEROSPHERE) 160-9-4.8 MCG/ACT AERO Inhale 2 puffs into the lungs in the morning and at bedtime. 02/12/21   Parrett, Fonnie Mu, NP  busPIRone (BUSPAR) 15 MG tablet Take 1 tablet (15 mg total) by mouth 2 (two) times daily. 02/18/21   Gwyneth Sprout, FNP  Cholecalciferol (VITAMIN D3 PO) Take 4,000 Units by mouth.    [provider]  clonazePAM (KLONOPIN) 0.5 MG tablet Take 1 tablet (0.5 mg total) by mouth at bedtime as needed for anxiety. 02/18/21   Gwyneth Sprout, FNP  Coenzyme Q10 (CO Q-10) 400 MG CAPS Take by mouth daily at 6 (six) AM.    [provider]  desoximetasone (TOPICORT) 0.25 % cream Apply 1 application topically 2 (two) times daily. 10/24/20   [provider]  DULoxetine (CYMBALTA) 30 MG capsule Take 1 capsule (30 mg total) by mouth 2 (two) times daily. 02/18/21   Gwyneth Sprout, FNP  escitalopram (LEXAPRO) 20 MG tablet Take 1 tablet (20 mg total) by mouth daily. 12/30/20   Mikey Kirschner, PA-C  gabapentin (NEURONTIN) 300 MG capsule Take 1 capsule (300 mg total) by mouth 2 (two) times daily AND 2 capsules (600 mg total) at bedtime.  01/29/21   Virginia Crews, MD  lovastatin (MEVACOR) 40 MG tablet Take 40 mg by mouth at bedtime.    [provider]  nitroGLYCERIN (NITROSTAT) 0.4 MG SL tablet Place 1 tablet (0.4 mg total) under the tongue every 5 (five) minutes x 3 doses as needed for chest pain. 11/14/20   O'Neal, Cassie Freer, MD  Omega-3 Fatty Acids (FISH OIL) 1200 MG CAPS Take by mouth daily at 6 (six) AM.    [provider]  ticagrelor (BRILINTA) 90 MG TABS tablet Take 1 tablet (90 mg total) by mouth 2 (two) times daily. 11/15/20   Geralynn Rile, MD  torsemide (DEMADEX) 20 MG tablet Take 1 tablet (20 mg total) by mouth daily as needed. 12/26/20   Kate Sable, MD    Allergies Butorphanol, Morphine,  Pedi-pre tape spray [wound dressing adhesive], Simvastatin, and Statins  Family History  Problem Relation Age of Onset   Heart Problems Mother    Heart Problems Father     Social History Social History   Tobacco Use   Smoking status: Former    Types: Cigarettes    Quit date: 10/23/2003    Years since quitting: 17.3   Smokeless tobacco: Never  Vaping Use   Vaping Use: Never used  Substance Use Topics   Alcohol use: Never   Drug use: Never    Review of Systems Constitutional: No fever/chills Eyes: No visual changes. ENT: No sore throat. Cardiovascular: Denies chest pain. Respiratory: Denies shortness of breath. Gastrointestinal: No abdominal pain.  No nausea, no vomiting.  No diarrhea. Genitourinary: Negative for dysuria. Musculoskeletal: Negative for acute arthralgias Skin: Negative for rash. Neurological: Positive for daily headaches, negative for weakness/numbness/paresthesias in any extremity Psychiatric: Negative for suicidal ideation/homicidal ideation   ____________________________________________   PHYSICAL EXAM:  VITAL SIGNS: ED Triage Vitals  Enc Vitals Group     BP 03/11/21 1224 (!) 141/79     Pulse Rate 03/11/21 1224 64     Resp 03/11/21 1224 18     Temp 03/11/21 1224 98 F (36.7 C)     Temp src --      SpO2 03/11/21 1224 91 %     Weight 03/11/21 1200 263 lb 7.2 oz (119.5 kg)     Height 03/11/21 1200 _0  (1.753 m)     Head Circumference --      Peak Flow --      Pain Score 03/11/21 1200 8     Pain Loc --      Pain Edu? --      Excl. in [place]? --    Constitutional: Alert and oriented. Well appearing and in no acute distress. Eyes: Conjunctivae are normal. PERRL. Head: Healing right forehead contusion and mild periorbital ecchymosis Nose: No congestion/rhinnorhea. Mouth/Throat: Mucous membranes are moist. Neck: No stridor Cardiovascular: Grossly normal heart sounds.  Good peripheral circulation. Respiratory: Normal respiratory effort.  No  retractions. Gastrointestinal: Soft and nontender. No distention. Musculoskeletal: No obvious deformities Neurologic:  Normal speech and language. No gross focal neurologic deficits are appreciated. Skin: Mild right periorbital ecchymosis.  Skin is warm and dry. No rash noted. Psychiatric: Mood and affect are normal. Speech and behavior are normal.  ____________________________________________   LABS (all labs ordered are listed, but only abnormal results are displayed)  Labs Reviewed  BASIC METABOLIC PANEL - Abnormal; Notable for the following components:      Result Value   Sodium 134 (*)    Glucose, Bld 162 (*)  BUN 24 (*)    Creatinine, Ser 1.70 (*)    GFR, Estimated 42 (*)    All other components within normal limits  CBC - Abnormal; Notable for the following components:   WBC 11.9 (*)    All other components within normal limits   ____________________________________________  EKG  ED ECG REPORT I, Naaman Plummer, the attending physician, personally viewed and interpreted this ECG.  Date: 03/11/2021 EKG Time: 1228 Rate: 86 Rhythm: normal sinus rhythm QRS Axis: normal Intervals: normal ST/T Wave abnormalities: normal Narrative Interpretation: no evidence of acute ischemia ____________________________________________  RADIOLOGY  ED MD interpretation: CT of the head without contrast shows no evidence of acute abnormalities including no intracerebral hemorrhage, obvious masses, or significant edema  CT of the cervical spine does not show any evidence of acute abnormalities including no acute fracture, malalignment, height loss, or dislocation  CT of the maxillofacial structures did not show any evidence of acute fracture/dislocation  Official radiology report(s): CT HEAD WO CONTRAST (5MM)  Result Date: 03/11/2021 CLINICAL DATA:  Headache since a fall 02/18/2021. Currently taking blood thinners. Right periorbital ecchymosis. Some nausea, dizziness and blurred  vision. EXAM: CT HEAD WITHOUT CONTRAST CT MAXILLOFACIAL WITHOUT CONTRAST CT CERVICAL SPINE WITHOUT CONTRAST TECHNIQUE: Multidetector CT imaging of the head, cervical spine, and maxillofacial structures were performed using the standard protocol without intravenous contrast. Multiplanar CT image reconstructions of the cervical spine and maxillofacial structures were also generated. COMPARISON:  None. FINDINGS: CT HEAD FINDINGS Brain: Moderate patchy white matter low density in both cerebral hemispheres. Mild-to-moderate enlargement of the ventricles and subarachnoid spaces. No intracranial hemorrhage, mass lesion or CT evidence of acute infarction. Vascular: No hyperdense vessel or unexpected calcification. Skull: Normal. Negative for fracture or focal lesion. Other: Small right frontal scalp hematoma. CT MAXILLOFACIAL FINDINGS Osseous: No fracture or mandibular dislocation. No destructive process. Orbits: Unremarkable. Sinuses: Unremarkable. Soft tissues: Small right supraorbital scalp hematoma. CT CERVICAL SPINE FINDINGS Alignment: Mild dextroconvex cervicothoracic scoliosis. No subluxations. Skull base and vertebrae: No acute fracture. No primary bone lesion or focal pathologic process. Soft tissues and spinal canal: No prevertebral fluid or swelling. No visible canal hematoma. Disc levels: Multilevel degenerative changes, including moderate posterior spur formation at the C5-6 level causing moderate canal stenosis. Upper chest: Clear lung apices. Other: None. IMPRESSION: 1. No skull fracture or intracranial hemorrhage. 2. No maxillofacial fracture. 3. No cervical spine fracture or subluxation. 4. Mild-to-moderate diffuse cerebral and cerebellar atrophy and moderate chronic small vessel white matter ischemic changes. 5. Multilevel cervical spine degenerative changes. Electronically Signed   By: Claudie Revering M.D.   On: 03/11/2021 13:27   CT Cervical Spine Wo Contrast  Result Date: 03/11/2021 CLINICAL DATA:   Headache since a fall 02/18/2021. Currently taking blood thinners. Right periorbital ecchymosis. Some nausea, dizziness and blurred vision. EXAM: CT HEAD WITHOUT CONTRAST CT MAXILLOFACIAL WITHOUT CONTRAST CT CERVICAL SPINE WITHOUT CONTRAST TECHNIQUE: Multidetector CT imaging of the head, cervical spine, and maxillofacial structures were performed using the standard protocol without intravenous contrast. Multiplanar CT image reconstructions of the cervical spine and maxillofacial structures were also generated. COMPARISON:  None. FINDINGS: CT HEAD FINDINGS Brain: Moderate patchy white matter low density in both cerebral hemispheres. Mild-to-moderate enlargement of the ventricles and subarachnoid spaces. No intracranial hemorrhage, mass lesion or CT evidence of acute infarction. Vascular: No hyperdense vessel or unexpected calcification. Skull: Normal. Negative for fracture or focal lesion. Other: Small right frontal scalp hematoma. CT MAXILLOFACIAL FINDINGS Osseous: No fracture or mandibular dislocation. No destructive  process. Orbits: Unremarkable. Sinuses: Unremarkable. Soft tissues: Small right supraorbital scalp hematoma. CT CERVICAL SPINE FINDINGS Alignment: Mild dextroconvex cervicothoracic scoliosis. No subluxations. Skull base and vertebrae: No acute fracture. No primary bone lesion or focal pathologic process. Soft tissues and spinal canal: No prevertebral fluid or swelling. No visible canal hematoma. Disc levels: Multilevel degenerative changes, including moderate posterior spur formation at the C5-6 level causing moderate canal stenosis. Upper chest: Clear lung apices. Other: None. IMPRESSION: 1. No skull fracture or intracranial hemorrhage. 2. No maxillofacial fracture. 3. No cervical spine fracture or subluxation. 4. Mild-to-moderate diffuse cerebral and cerebellar atrophy and moderate chronic small vessel white matter ischemic changes. 5. Multilevel cervical spine degenerative changes. Electronically  Signed   By: Claudie Revering M.D.   On: 03/11/2021 13:27   CT Maxillofacial Wo Contrast  Result Date: 03/11/2021 CLINICAL DATA:  Headache since a fall 02/18/2021. Currently taking blood thinners. Right periorbital ecchymosis. Some nausea, dizziness and blurred vision. EXAM: CT HEAD WITHOUT CONTRAST CT MAXILLOFACIAL WITHOUT CONTRAST CT CERVICAL SPINE WITHOUT CONTRAST TECHNIQUE: Multidetector CT imaging of the head, cervical spine, and maxillofacial structures were performed using the standard protocol without intravenous contrast. Multiplanar CT image reconstructions of the cervical spine and maxillofacial structures were also generated. COMPARISON:  None. FINDINGS: CT HEAD FINDINGS Brain: Moderate patchy white matter low density in both cerebral hemispheres. Mild-to-moderate enlargement of the ventricles and subarachnoid spaces. No intracranial hemorrhage, mass lesion or CT evidence of acute infarction. Vascular: No hyperdense vessel or unexpected calcification. Skull: Normal. Negative for fracture or focal lesion. Other: Small right frontal scalp hematoma. CT MAXILLOFACIAL FINDINGS Osseous: No fracture or mandibular dislocation. No destructive process. Orbits: Unremarkable. Sinuses: Unremarkable. Soft tissues: Small right supraorbital scalp hematoma. CT CERVICAL SPINE FINDINGS Alignment: Mild dextroconvex cervicothoracic scoliosis. No subluxations. Skull base and vertebrae: No acute fracture. No primary bone lesion or focal pathologic process. Soft tissues and spinal canal: No prevertebral fluid or swelling. No visible canal hematoma. Disc levels: Multilevel degenerative changes, including moderate posterior spur formation at the C5-6 level causing moderate canal stenosis. Upper chest: Clear lung apices. Other: None. IMPRESSION: 1. No skull fracture or intracranial hemorrhage. 2. No maxillofacial fracture. 3. No cervical spine fracture or subluxation. 4. Mild-to-moderate diffuse cerebral and cerebellar atrophy  and moderate chronic small vessel white matter ischemic changes. 5. Multilevel cervical spine degenerative changes. Electronically Signed   By: Claudie Revering M.D.   On: 03/11/2021 13:27    ____________________________________________   PROCEDURES  Procedure(s) performed (including Critical Care):  Procedures   ____________________________________________   INITIAL IMPRESSION / ASSESSMENT AND PLAN / ED COURSE  As part of my medical decision making, I reviewed the following data within the electronic medical record, if available:  Nursing notes reviewed and incorporated, Labs reviewed, EKG interpreted, Old chart reviewed, Radiograph reviewed and Notes from prior ED visits reviewed and incorporated        Patient presenting with head trauma.  Patient's neurological exam was non-focal and unremarkable.  [place] Head CT Rule was applied and patient did not fall into the low risk category so a head CT was obtained.  This showed no significant findings.  At this time, it is felt that the most likely explanation for the patient's symptoms is concussion.   I also considered SAH, SDH, Epidural Hematoma, IPH, skull fracture, migraine but this appears less likely considering the data gathered thus far.   Patient provided instructions for postconcussion protocol as well as information on postconcussive syndrome..   Patient remained stable and  neurologically intact while in the emergency department.  Discussed warning signs that would prompt return to ED.  Head trauma handout was provided.  Discussed in detail concussion management.  No sports or strenuous activity until symptoms free.  Return to emergency department urgently if new or worsening symptoms develop.    Impression:  Post concussion syndrome Head injury Right periorbital ecchymosis  Plan  Discharge from ED Tylenol for pain control. Avoid aspirin, NSAIDs, or other blood thinners. Advised patient on supportive measures for cognitive rest  - avoid use of cognitive function for at least 24 hours.  This means no tv, books, texting, computers, etc. Limit visitors to the house.  Head trauma instructions provided in discharge instructions Instructed Pt to monitor for neurologic symptoms, severe HA, change in mental status, seizures, loss of conciousness. Instructed Pt to f/up w/ PCP and neurology. Pt verbally expressed understanding and all questions were addressed to Pt's satisfaction.  Dispo: Discharge home with neurology follow-up     ____________________________________________   FINAL CLINICAL IMPRESSION(S) / ED DIAGNOSES  Final diagnoses:  Post concussion syndrome  Injury of head, initial encounter     ED Discharge Orders          Ordered    butalbital-acetaminophen-caffeine (FIORICET) 50-325-40 MG tablet  Every 6 hours PRN        03/11/21 1634             Note:  This document was prepared using Dragon voice recognition software and may include unintentional dictation errors.    Naaman Plummer, MD 03/11/21 [phone]

## 2021-03-11 NOTE — Telephone Encounter (Signed)
Pt's wife answered call, states their daughter is taking pt to ED and has already left. Advised her that if they need anything to call and let us know. She verbalized understanding.   Summary: Patient think he has a sinus infection/also has headaches   Patients daughter called in trying to get appt for patient. Soonest shown in Jan 3 with Dr Danella Sensing, patient having headaches now, thinks it maybe from fall 3 weeks ago.   ----- Message from Jodie Echevaria sent at 03/11/2021  9:36 AM EST -----  Patient called in and stated that he think he has a sinus infection and need to see the Dr but nothing available, has cough and sinus congestion. Asking for a call back at Ph# [phone]

## 2021-03-11 NOTE — ED Triage Notes (Signed)
Presents s/p fall about 2-3 weeks ago  conts to have headache  state [place] is worse today

## 2021-03-11 NOTE — ED Provider Notes (Signed)
°  Emergency Medicine Provider Triage Evaluation Note  Tony Williams , a 75 y.o.male,  was evaluated in triage.  Pt complains of headache.  He states that he fell about 2 to 3 weeks ago.  Worsening headache today.  Reports nausea.  Denies LOC, chest pain, shortness of breath, or fever/chills   Review of Systems  Positive: Headache Negative: Denies fever, chest pain, vomiting  Physical Exam   Vitals:   03/11/21 1224  BP: (!) 141/79  Pulse: 64  Resp: 18  Temp: 98 F (36.7 C)  SpO2: 91%   Gen:   Awake, no distress   Resp:  Normal effort  MSK:   Moves extremities without difficulty  Other:    Medical Decision Making  Given the patient's initial medical screening exam, the following diagnostic evaluation has been ordered. The patient will be placed in the appropriate treatment space, once one is available, to complete the evaluation and treatment. I have discussed the plan of care with the patient and I have advised the patient that an ED physician or mid-level practitioner will reevaluate their condition after the test results have been received, as the results may give them additional insight into the type of treatment they may need.    Diagnostics: Labs, head, maxillofacial, and cervical spine CT.  Treatments: none immediately   Teodoro Spray, [place] 03/11/21 1232    Vladimir Crofts, MD 03/11/21 [phone]

## 2021-03-12 ENCOUNTER — Ambulatory Visit: Payer: Self-pay | Admitting: *Deleted

## 2021-03-12 DIAGNOSIS — I10 Essential (primary) hypertension: Secondary | ICD-10-CM | POA: Diagnosis not present

## 2021-03-12 DIAGNOSIS — B9689 Other specified bacterial agents as the cause of diseases classified elsewhere: Secondary | ICD-10-CM | POA: Diagnosis not present

## 2021-03-12 DIAGNOSIS — R5383 Other fatigue: Secondary | ICD-10-CM | POA: Diagnosis not present

## 2021-03-12 DIAGNOSIS — J019 Acute sinusitis, unspecified: Secondary | ICD-10-CM | POA: Diagnosis not present

## 2021-03-12 NOTE — Telephone Encounter (Signed)
°  Chief Complaint: pain above left eye Symptoms: headache and earache Frequency: started 2-3 days ago Pertinent Negatives: Patient denies fever Disposition: [] ED /[x] Urgent Care (no appt availability in office) / [] Appointment(In office/virtual)/ []  [facility]/ [] Home Care/ [] Refused Recommended Disposition  Additional Notes: No pcp availability and no smart device. Advised UC today.      Reason for Disposition  Face pain present > 24 hours  Answer Assessment - Initial Assessment Questions 1. ONSET: "When did the pain start?" (e.g., minutes, hours, days)     2-3 days ago 2. ONSET: "Does the pain come and go, or has it been constant since it started?" (e.g., constant, intermittent, fleeting)     3. SEVERITY: "How bad is the pain?"   (Scale 1-10; mild, moderate or severe)   - MILD (1-3): doesn't interfere with normal activities    - MODERATE (4-7): interferes with normal activities or awakens from sleep    - SEVERE (8-10): excruciating pain, unable to do any normal activities      Very bothersome 4. LOCATION: "Where does it hurt?"      Above left eye and left ear 5. RASH: "Is there any redness, rash, or swelling of the face?"     na 6. FEVER: "Do you have a fever?" If Yes, ask: "What is it, how was it measured, and when did it start?"      unknown 7. OTHER SYMPTOMS: "Do you have any other symptoms?" (e.g., fever, toothache, nasal discharge, nasal congestion, clicking sensation in jaw joint)     none 8. PREGNANCY: "Is there any chance you are pregnant?" "When was your last menstrual period?"     na  Protocols used: Face Pain-A-AH

## 2021-03-14 DIAGNOSIS — F411 Generalized anxiety disorder: Secondary | ICD-10-CM | POA: Diagnosis not present

## 2021-03-14 DIAGNOSIS — F5104 Psychophysiologic insomnia: Secondary | ICD-10-CM | POA: Diagnosis not present

## 2021-03-19 ENCOUNTER — Ambulatory Visit (INDEPENDENT_AMBULATORY_CARE_PROVIDER_SITE_OTHER): Payer: Medicare Other | Admitting: *Deleted

## 2021-03-19 DIAGNOSIS — F411 Generalized anxiety disorder: Secondary | ICD-10-CM

## 2021-03-19 DIAGNOSIS — E1159 Type 2 diabetes mellitus with other circulatory complications: Secondary | ICD-10-CM

## 2021-03-19 DIAGNOSIS — F5104 Psychophysiologic insomnia: Secondary | ICD-10-CM

## 2021-03-19 DIAGNOSIS — I152 Hypertension secondary to endocrine disorders: Secondary | ICD-10-CM

## 2021-03-19 NOTE — Chronic Care Management (AMB) (Cosign Needed)
Chronic Care Management    Clinical Social Work Note  03/19/2021 Name: Tony Williams MRN: [id number] DOB: 09/29/1945  Tony Williams is a 76 y.o. year old male who is a primary care patient of Bacigalupo, Dionne Bucy, MD. The CCM team was consulted to assist the patient with chronic disease management and/or care coordination needs related to: Mental Health Counseling and Resources.   Engaged with patient by telephone for follow up visit in response to provider referral for social work chronic care management and care coordination services.   Consent to Services:  The patient was given information about Chronic Care Management services, agreed to services, and gave verbal consent prior to initiation of services.  Please see initial visit note for detailed documentation.   Patient agreed to services and consent obtained.   Assessment: Review of patient past medical history, allergies, medications, and health status, including review of relevant consultants reports was performed today as part of a comprehensive evaluation and provision of chronic care management and care coordination services.     SDOH (Social Determinants of Health) assessments and interventions performed:    Advanced Directives Status: Not addressed in this encounter.  CCM Care Plan  Allergies  Allergen Reactions   Butorphanol Itching   Morphine Other (See Comments)    Redness around IV site   Pedi-Pre Tape Spray [Wound Dressing Adhesive] Itching   Simvastatin Other (See Comments)    "Destroyed my muscles"   Statins Other (See Comments)    "Destroyed my muscles"    Outpatient Encounter Medications as of 03/19/2021  Medication Sig   albuterol (VENTOLIN HFA) 108 (90 Base) MCG/ACT inhaler Inhale 2 puffs into the lungs every 6 (six) hours as needed for wheezing or shortness of breath.   allopurinol (ZYLOPRIM) 300 MG tablet Take 1 tablet (300 mg total) by mouth daily.   aspirin EC 81 MG tablet Take 81 mg by mouth  daily. Swallow whole.   blood glucose meter kit and supplies Dispense based on patient and insurance preference. Use up to four times daily as directed. (FOR ICD-10 E10.9, E11.9). check blood glucose fasting and before meals four times daily.   Budeson-Glycopyrrol-Formoterol (BREZTRI AEROSPHERE) 160-9-4.8 MCG/ACT AERO Inhale 2 puffs into the lungs 2 (two) times daily.   Budeson-Glycopyrrol-Formoterol (BREZTRI AEROSPHERE) 160-9-4.8 MCG/ACT AERO Inhale 2 puffs into the lungs in the morning and at bedtime.   Budeson-Glycopyrrol-Formoterol (BREZTRI AEROSPHERE) 160-9-4.8 MCG/ACT AERO Inhale 2 puffs into the lungs in the morning and at bedtime.   Budeson-Glycopyrrol-Formoterol (BREZTRI AEROSPHERE) 160-9-4.8 MCG/ACT AERO Inhale 2 puffs into the lungs in the morning and at bedtime.   busPIRone (BUSPAR) 15 MG tablet Take 1 tablet (15 mg total) by mouth 2 (two) times daily.   butalbital-acetaminophen-caffeine (FIORICET) 50-325-40 MG tablet Take 1-2 tablets by mouth every 6 (six) hours as needed for headache.   Cholecalciferol (VITAMIN D3 PO) Take 4,000 Units by mouth.   clonazePAM (KLONOPIN) 0.5 MG tablet Take 1 tablet (0.5 mg total) by mouth at bedtime as needed for anxiety.   Coenzyme Q10 (CO Q-10) 400 MG CAPS Take by mouth daily at 6 (six) AM.   desoximetasone (TOPICORT) 0.25 % cream Apply 1 application topically 2 (two) times daily.   DULoxetine (CYMBALTA) 30 MG capsule Take 1 capsule (30 mg total) by mouth 2 (two) times daily.   escitalopram (LEXAPRO) 20 MG tablet Take 1 tablet (20 mg total) by mouth daily.   gabapentin (NEURONTIN) 300 MG capsule Take 1 capsule (300 mg total) by mouth 2 (two)  times daily AND 2 capsules (600 mg total) at bedtime.   lovastatin (MEVACOR) 40 MG tablet Take 40 mg by mouth at bedtime.   nitroGLYCERIN (NITROSTAT) 0.4 MG SL tablet Place 1 tablet (0.4 mg total) under the tongue every 5 (five) minutes x 3 doses as needed for chest pain.   Omega-3 Fatty Acids (FISH OIL) 1200 MG  CAPS Take by mouth daily at 6 (six) AM.   ticagrelor (BRILINTA) 90 MG TABS tablet Take 1 tablet (90 mg total) by mouth 2 (two) times daily.   torsemide (DEMADEX) 20 MG tablet Take 1 tablet (20 mg total) by mouth daily as needed.   No facility-administered encounter medications on file as of 03/19/2021.    Patient Active Problem List   Diagnosis Date Noted   Daytime sleepiness 02/12/2021   Hospital discharge follow-up 12/16/2020   Idiopathic hypotension 12/16/2020   Hx of myocardial infarction 12/16/2020   Bradycardia 12/16/2020   Dizziness 12/16/2020   Hyperkalemia    Symptomatic sinus bradycardia 12/08/2020   Dyspnea 11/15/2020   Acute on chronic diastolic CHF (congestive heart failure) ([place]) 11/10/2020   NSTEMI (non-ST elevated myocardial infarction) ([place]) 11/08/2020   Positive colorectal cancer screening using Cologuard test    Polyp of transverse colon    Morbid obesity ([place]) 07/21/2020   Chronic pain of both knees 07/21/2020   Diabetic polyneuropathy associated with type 2 diabetes mellitus ([place]) 07/21/2020   Stage 3a chronic kidney disease ([place]) 07/21/2020   Chronic gout due to renal impairment without tophus 07/21/2020   Hypertension associated with diabetes ([place]) 07/21/2020   Hyperlipidemia associated with type 2 diabetes mellitus ([place]) 07/21/2020   GAD (generalized anxiety disorder) 07/21/2020   Psychophysiological insomnia 07/21/2020   Screening for blood or protein in urine 04/18/2020   Vitamin D insufficiency 04/18/2020   Chronic obstructive pulmonary disease ([place]) 04/18/2020   Controlled type 2 diabetes mellitus with diabetic polyneuropathy, without long-term current use of insulin ([place]) 04/18/2020   Coronary artery disease with hx of myocardial infarct w/o hx of CABG 04/18/2020   S/P primary angioplasty with coronary stent 04/18/2020   History of smoking at least 1 pack per day for at least 30 years 04/18/2020    Conditions to be addressed/monitored:  Anxiety and  Psychophysiological Insomnia ; Mental Health Concerns    Care Plan : General Social Work (Adult)  Updates made by Vern Claude, LCSW since 03/19/2021 12:00 AM     Problem: CHL AMB "PATIENT-SPECIFIC PROBLEM"   Note:   CARE PLAN ENTRY (see longitudinal plan of care for additional care plan information)  Current Barriers:  Knowledge deficits related to accessing mental health provider in patient with Anxiety  Patient is experiencing symptoms of  anxiety evidenced by difficulty sleeping, mind racing-specifically at night, patient would like to continue Klonopin and is requesting dosage increase Patient agreeable to follow up with a psychiatrist however has declined follow up with a therapist at this time Patient needs Support, Education, and Care Coordination in order to meet unmet mental health needs  Mental Health Concerns   Clinical Social Work Goal(s):  Over the next 90 days, patient will work with SW bi-weekly by telephone or in person to reduce or manage symptoms of anxiety until connected for ongoing counseling resources.  Patient will implement clinical interventions discussed today to decreases symptoms of anxiety and increase knowledge and/or ability of: self-management skills.  Interventions:  Assessed patient's understanding, education, previous treatment and care coordination needs  Provided basic mental health support, education  and interventions  Discussed options for long term counseling based on need and insurance-patient continues to decline this option_  Reviewed mental health medications with patient prescribed by PCP and discussed compliance  Patient continues to discuss poor sleep and would like medication to be re-assessed(patient agreeable to discussing this with his provider during next visit) Other interventions include: Motivational Interviewing employed Participation in counseling encouraged to develop coping strategies for anxiety Increase in activities /  exercise encouraged as well as meditation/deep breathing Verbalization of feelings encouraged    Patient Self Care Activities & Deficits:  Patient is unable to independently navigate community resource options without care coordination support Self administers medications as prescribed Attends all scheduled provider appointments Performs ADL's independently Performs IADL's independently Strong family or social support  Please see past updates related to this goal by clicking on the "Past Updates" button in the selected goal        Follow Up Plan: Appointment scheduled for SW follow up with client by phone on:  04/02/21       Elliot Gurney, [place] Worker  [facility] [phone]

## 2021-03-19 NOTE — Patient Instructions (Signed)
Visit Information  Thank you for taking time to visit with me today. Please don't hesitate to contact me if I can be of assistance to you before our next scheduled telephone appointment.  Following are the goals we discussed today:  - practice relaxation or meditation daily - talk about feelings with a friend, family or spiritual advisor - practice positive thinking and self-talk  -Continue to consider follow up with [name] next appointment is by telephone on 04/02/21 at 2pm  Please call the care guide team at [phone] if you need to cancel or reschedule your appointment.   If you are experiencing a Mental Health or [place] or need someone to talk to, please call the Suicide and Crisis Lifeline: 988   The patient verbalized understanding of instructions, educational materials, and care plan provided today and declined offer to receive copy of patient instructions, educational materials, and care plan.   Telephone follow up appointment with care management team member scheduled for: 04/02/21   Elliot Gurney, [place] Worker  [facility] [phone]

## 2021-03-24 ENCOUNTER — Other Ambulatory Visit: Payer: Self-pay | Admitting: Family Medicine

## 2021-03-24 NOTE — Telephone Encounter (Signed)
Pt stated last time he seen Dr. B she advised pt could take gabapentin (NEURONTIN) 300 MG capsule 3xs a day instead of twice and now pt is out of medication and ran short and needs a new Rx sent with new directions for 3 a day/ please advise   Send to [facility], [place]   [address] [place], [place] [address]

## 2021-03-26 NOTE — Telephone Encounter (Signed)
The Rx is enough for taking it 3 times daily - 1 in the morning, 1 in the afternoon and 2 at bedtime.  Is he taking it differently than that?

## 2021-03-26 NOTE — Telephone Encounter (Signed)
Pt states he takes gabapentin 300mg  in the am and 600mg  in the pm.  He says he called the pharmacy and he actually has a refill there he never picked up so he has plenty now.   Thanks,   -Mickel Baas

## 2021-04-01 ENCOUNTER — Ambulatory Visit: Payer: Medicare Other | Admitting: Physician Assistant

## 2021-04-02 ENCOUNTER — Telehealth: Payer: Self-pay | Admitting: Pulmonary Disease

## 2021-04-02 ENCOUNTER — Telehealth: Payer: Medicare Other

## 2021-04-02 ENCOUNTER — Encounter: Payer: Self-pay | Admitting: Family Medicine

## 2021-04-02 ENCOUNTER — Telehealth (INDEPENDENT_AMBULATORY_CARE_PROVIDER_SITE_OTHER): Payer: Medicare Other | Admitting: Family Medicine

## 2021-04-02 ENCOUNTER — Ambulatory Visit: Payer: Self-pay | Admitting: *Deleted

## 2021-04-02 DIAGNOSIS — E1159 Type 2 diabetes mellitus with other circulatory complications: Secondary | ICD-10-CM

## 2021-04-02 DIAGNOSIS — U071 COVID-19: Secondary | ICD-10-CM

## 2021-04-02 DIAGNOSIS — I152 Hypertension secondary to endocrine disorders: Secondary | ICD-10-CM | POA: Diagnosis not present

## 2021-04-02 MED ORDER — MOLNUPIRAVIR EUA 200MG CAPSULE: 4.0000 | ORAL_CAPSULE | Freq: Two times a day (BID) | ORAL | 0 refills | Status: AC

## 2021-04-02 MED ORDER — LISINOPRIL 10 MG PO TABS: 10.0000 mg | ORAL_TABLET | Freq: Every day | ORAL | 1 refills | Status: DC

## 2021-04-02 NOTE — Progress Notes (Signed)
I,Sha'taria Tyson,acting as a Education administrator for [name], MD.,have documented all relevant documentation on the behalf of [name], MD,as directed by  [name], MD while in the presence of [name], MD.  MyChart Video Visit    Virtual Visit via Video Note   This visit type was conducted due to national recommendations for restrictions regarding the COVID-19 Pandemic (e.g. social distancing) in an effort to limit this patient's exposure and mitigate transmission in our community. This patient is at least at moderate risk for complications without adequate follow up. This format is felt to be most appropriate for this patient at this time. Physical exam was limited by quality of the video and audio technology used for the visit.   Patient location: home Provider location: [facility]  I discussed the limitations of evaluation and management by telemedicine and the availability of in person appointments. The patient expressed understanding and agreed to proceed.  Patient: Tony Williams   DOB: 1945-04-22   76 y.o. Male  MRN: [id number] Visit Date: 04/02/2021  Today's healthcare provider: [name], MD   Chief Complaint  Patient presents with   Covid Positive   Cough   Sore Throat   Generalized Body Aches   Shortness of Breath   Subjective    HPI  Patient tested positive today. Reports having symptoms of sore throat, aching, dry coughing and heavy breathing due to SOB. Patient reports taking tylenol and aleve and paxil (3) that son provided to him due to him being positive as well. 179/101 lisinopril 5 mg taken this morning. Was taken off 3 months ago due to bp dropping but ever since he has been off it has been high.  Medications: Outpatient Medications Prior to Visit  Medication Sig   albuterol (VENTOLIN HFA) 108 (90 Base) MCG/ACT inhaler Inhale 2 puffs into the lungs every 6 (six) hours as needed for wheezing or shortness  of breath.   allopurinol (ZYLOPRIM) 300 MG tablet Take 1 tablet (300 mg total) by mouth daily.   aspirin EC 81 MG tablet Take 81 mg by mouth daily. Swallow whole.   blood glucose meter kit and supplies Dispense based on patient and insurance preference. Use up to four times daily as directed. (FOR ICD-10 E10.9, E11.9). check blood glucose fasting and before meals four times daily.   Budeson-Glycopyrrol-Formoterol (BREZTRI AEROSPHERE) 160-9-4.8 MCG/ACT AERO Inhale 2 puffs into the lungs 2 (two) times daily.   Budeson-Glycopyrrol-Formoterol (BREZTRI AEROSPHERE) 160-9-4.8 MCG/ACT AERO Inhale 2 puffs into the lungs in the morning and at bedtime.   Budeson-Glycopyrrol-Formoterol (BREZTRI AEROSPHERE) 160-9-4.8 MCG/ACT AERO Inhale 2 puffs into the lungs in the morning and at bedtime.   Budeson-Glycopyrrol-Formoterol (BREZTRI AEROSPHERE) 160-9-4.8 MCG/ACT AERO Inhale 2 puffs into the lungs in the morning and at bedtime.   Cholecalciferol (VITAMIN D3 PO) Take 4,000 Units by mouth.   Coenzyme Q10 (CO Q-10) 400 MG CAPS Take by mouth daily at 6 (six) AM.   desoximetasone (TOPICORT) 0.25 % cream Apply 1 application topically 2 (two) times daily.   escitalopram (LEXAPRO) 20 MG tablet Take 1 tablet (20 mg total) by mouth daily.   gabapentin (NEURONTIN) 300 MG capsule Take 1 capsule (300 mg total) by mouth 2 (two) times daily AND 2 capsules (600 mg total) at bedtime.   lovastatin (MEVACOR) 40 MG tablet Take 40 mg by mouth at bedtime.   Omega-3 Fatty Acids (FISH OIL) 1200 MG CAPS Take by mouth daily at 6 (six) AM.   ticagrelor (BRILINTA)  90 MG TABS tablet Take 1 tablet (90 mg total) by mouth 2 (two) times daily.   torsemide (DEMADEX) 20 MG tablet Take 1 tablet (20 mg total) by mouth daily as needed.   clonazePAM (KLONOPIN) 0.5 MG tablet Take 1 tablet (0.5 mg total) by mouth at bedtime as needed for anxiety. (Patient not taking: Reported on 04/02/2021)   [DISCONTINUED] busPIRone (BUSPAR) 15 MG tablet Take 1 tablet  (15 mg total) by mouth 2 (two) times daily. (Patient not taking: Reported on 04/02/2021)   [DISCONTINUED] butalbital-acetaminophen-caffeine (FIORICET) 50-325-40 MG tablet Take 1-2 tablets by mouth every 6 (six) hours as needed for headache. (Patient not taking: Reported on 04/02/2021)   [DISCONTINUED] DULoxetine (CYMBALTA) 30 MG capsule Take 1 capsule (30 mg total) by mouth 2 (two) times daily. (Patient not taking: Reported on 04/02/2021)   [DISCONTINUED] nitroGLYCERIN (NITROSTAT) 0.4 MG SL tablet Place 1 tablet (0.4 mg total) under the tongue every 5 (five) minutes x 3 doses as needed for chest pain. (Patient not taking: Reported on 04/02/2021)   No facility-administered medications prior to visit.    Review of Systems  HENT:  Positive for sore throat.   Respiratory:  Positive for cough and shortness of breath.      Objective    There were no vitals taken for this visit.   Physical Exam Constitutional:      General: He is not in acute distress.    Appearance: Normal appearance. He is not diaphoretic.  HENT:     Head: Normocephalic.  Eyes:     Conjunctiva/sclera: Conjunctivae normal.  Pulmonary:     Effort: Pulmonary effort is normal. No respiratory distress.  Neurological:     Mental Status: He is alert and oriented to person, place, and time. Mental status is at baseline.       Assessment & Plan     1. COVID-19 - symptoms c/w viral URI 2/2 COVID 19 with positive home test - discussed need to quarantine 10 days from start of symptoms (or possibly 5 days with new CDC recommendations) and until fever-free for at least 24 hours - discussed symptomatic management, natural course, and return precautions   - discussed antiviral options - paxlovid would interfere with Brillinta and statin - will Rx monulpiravir instead - at risk for COPD exacerbation, so if wheezing/SOB worsens, consider prednisone   2. Hypertension associated with diabetes ([place]) - reports it has been uncontrolled  for 2 months on home readings - increase lisinopril to 75m daily - recheck at upcoming f/u appt.   Meds ordered this encounter  Medications   molnupiravir EUA (LAGEVRIO) 200 mg CAPS capsule    Sig: Take 4 capsules (800 mg total) by mouth 2 (two) times daily for 5 days.    Dispense:  40 capsule    Refill:  0   lisinopril (ZESTRIL) 10 MG tablet    Sig: Take 1 tablet (10 mg total) by mouth daily.    Dispense:  90 tablet    Refill:  1     No follow-ups on file.     I discussed the assessment and treatment plan with the patient. The patient was provided an opportunity to ask questions and all were answered. The patient agreed with the plan and demonstrated an understanding of the instructions.   The patient was advised to call back or seek an in-person evaluation if the symptoms worsen or if the condition fails to improve as anticipated.  I, ALavon Paganini MD, have reviewed all documentation  for this visit. The documentation on 04/02/21 for the exam, diagnosis, procedures, and orders are all accurate and complete.   Xandria Gallaga, Dionne Bucy, MD, MPH [facility]

## 2021-04-02 NOTE — Telephone Encounter (Signed)
Summary: covid issues   Pt called in stating he his son was recently covid positive and wanted to speak with a nurse about some information with a Vaccine. Please advise.     Call to number provided- (Clara) left message to return call to office

## 2021-04-02 NOTE — Telephone Encounter (Signed)
°  Chief Complaint: COVID Symptoms: SOB, cough, headache, body aches, sore throat Frequency: today Pertinent Negatives: Patient denies fever Disposition: [] ED /[] Urgent Care (no appt availability in office) / [x] Appointment(In office/virtual)/ []  [facility]/ [] Home Care/ [] Refused Recommended Disposition /[] [place] [facility]/ []  Follow-up with PCP Additional Notes: Pt is high risk pt and tested positive today for COVID. He had 4th booster yesterday and son also tested positive at beginning of week. D/t no appts, called and spoke with Judson Roch, [facility] who was to talk with pt about a virtual appt..    Reason for Disposition  [1] HIGH RISK for severe COVID complications (e.g., weak immune system, age > 74 years, obesity with BMI > 25, pregnant, chronic lung disease or other chronic medical condition) AND [2] COVID symptoms (e.g., cough, fever)  (Exceptions: Already seen by PCP and no new or worsening symptoms.)  Answer Assessment - Initial Assessment Questions 1. COVID-19 DIAGNOSIS: "Who made your COVID-19 diagnosis?" "Was it confirmed by a positive lab test or self-test?" If not diagnosed by a doctor (or NP/PA), ask "Are there lots of cases (community spread) where you live?" Note: See public health department website, if unsure.     Home test 2. COVID-19 EXPOSURE: "Was there any known exposure to COVID before the symptoms began?" CDC Definition of close contact: within 6 feet (2 meters) for a total of 15 minutes or more over a 24-hour period.      Son tested positive  3. ONSET: "When did the COVID-19 symptoms start?"      today 5. COUGH: "Do you have a cough?" If Yes, ask: "How bad is the cough?"       cough 6. FEVER: "Do you have a fever?" If Yes, ask: "What is your temperature, how was it measured, and when did it start?"     no 7. RESPIRATORY STATUS: "Describe your breathing?" (e.g., shortness of breath, wheezing, unable to speak)      SOB 9. HIGH RISK DISEASE: "Do you have any  chronic medical problems?" (e.g., asthma, heart or lung disease, weak immune system, obesity, etc.)     Heart lung and kidney disease 10. VACCINE: "Have you had the COVID-19 vaccine?" If Yes, ask: "Which one, how many shots, when did you get it?"       yes 11. BOOSTER: "Have you received your COVID-19 booster?" If Yes, ask: "Which one and when did you get it?"       Yes 4th booster yesterday 13. OTHER SYMPTOMS: "Do you have any other symptoms?"  (e.g., chills, fatigue, headache, loss of smell or taste, muscle pain, sore throat)       Headache, sore throat, body aches, cough,  Protocols used: Coronavirus (COVID-19) Diagnosed or Suspected-A-AH

## 2021-04-02 NOTE — Telephone Encounter (Signed)
Spoke to patient.  Patient stated that he tested positive for covid today with home test. C/o dry cough, wheezing, increased sob, headache, sore throat, body aches, nasal/head congestion. Sx started this morning.  Denied f/c/s. He does not have supplemental oxygen. He is using Breztri BID and albuterol HFA 3-4x daily.  He is scheduled for sleep study tomorrow 04/03/20. This will need to be rescheduled. Rodena Piety, please advise.   Dr. Mortimer Fries, please advise on sx. Dr. Halford Chessman is unavailable.

## 2021-04-02 NOTE — Telephone Encounter (Signed)
Spoke to patient, who stated that he did a telephone visit with PCP today and was prescribed antiviral. Nothing further needed at this time.  Rodena Piety, please reschedule sleep study. Thank you!

## 2021-04-03 ENCOUNTER — Ambulatory Visit: Payer: Medicare Other

## 2021-04-06 ENCOUNTER — Encounter: Payer: Self-pay | Admitting: Family Medicine

## 2021-04-06 ENCOUNTER — Ambulatory Visit: Payer: Self-pay | Admitting: *Deleted

## 2021-04-06 ENCOUNTER — Ambulatory Visit (INDEPENDENT_AMBULATORY_CARE_PROVIDER_SITE_OTHER): Payer: Medicare Other | Admitting: Family Medicine

## 2021-04-06 VITALS — BP 189/82

## 2021-04-06 DIAGNOSIS — J441 Chronic obstructive pulmonary disease with (acute) exacerbation: Secondary | ICD-10-CM

## 2021-04-06 DIAGNOSIS — U071 COVID-19: Secondary | ICD-10-CM | POA: Diagnosis not present

## 2021-04-06 MED ORDER — PREDNISONE 20 MG PO TABS: 40.0000 mg | ORAL_TABLET | Freq: Every day | ORAL | 0 refills | Status: AC

## 2021-04-06 NOTE — Telephone Encounter (Signed)
Reason for Disposition  [1] Longstanding difficulty breathing (e.g., CHF, COPD, emphysema) AND [2] WORSE than normal  Answer Assessment - Initial Assessment Questions 1. RESPIRATORY STATUS: "Describe your breathing?" (e.g., wheezing, shortness of breath, unable to speak, severe coughing)      Pt calling in c/o shortness of breath.  Positive for Covid last THur.   I'm feeling like I'm not breathing good.   I have COPD.   I'm coughing up mucus that's clear.   C/O chest tightness that started a couple of days ago.   In Sept I had to 2 heart attacks and stents put in.   I don't think it's my heart.   I just feel tight in my chest.  Never had pneumonia. 2. ONSET: "When did this breathing problem begin?"      I'm worried about my BP 189/82 in left arm.  Right arm 193/87.  I have a history of elevated BP so I'm concerned it's this high.   Since Covid my BP has been elevated. 3. PATTERN "Does the difficult breathing come and go, or has it been constant since it started?"      *No Answer* 4. SEVERITY: "How bad is your breathing?" (e.g., mild, moderate, severe)    - MILD: No SOB at rest, mild SOB with walking, speaks normally in sentences, can lie down, no retractions, pulse < 100.    - MODERATE: SOB at rest, SOB with minimal exertion and prefers to sit, cannot lie down flat, speaks in phrases, mild retractions, audible wheezing, pulse 100-120.    - SEVERE: Very SOB at rest, speaks in single words, struggling to breathe, sitting hunched forward, retractions, pulse > 120      *No Answer* 5. RECURRENT SYMPTOM: "Have you had difficulty breathing before?" If Yes, ask: "When was the last time?" and "What happened that time?"      *No Answer* 6. CARDIAC HISTORY: "Do you have any history of heart disease?" (e.g., heart attack, angina, bypass surgery, angioplasty)      *No Answer* 7. LUNG HISTORY: "Do you have any history of lung disease?"  (e.g., pulmonary embolus, asthma, emphysema)     *No Answer* 8. CAUSE:  "What do you think is causing the breathing problem?"      *No Answer* 9. OTHER SYMPTOMS: "Do you have any other symptoms? (e.g., dizziness, runny nose, cough, chest pain, fever)     *No Answer* 10. O2 SATURATION MONITOR:  "Do you use an oxygen saturation monitor (pulse oximeter) at home?" If Yes, "What is your reading (oxygen level) today?" "What is your usual oxygen saturation reading?" (e.g., 95%)       *No Answer* 11. PREGNANCY: "Is there any chance you are pregnant?" "When was your last menstrual period?"       *No Answer* 12. TRAVEL: "Have you traveled out of the country in the last month?" (e.g., travel history, exposures)       *No Answer*  Protocols used: Breathing Difficulty-A-AH

## 2021-04-06 NOTE — Telephone Encounter (Signed)
°  Chief Complaint: shortness of breath, chest tightness, positive covid   Has positive cardiac history but feels this is related to covid Symptoms: above and still testing positive for covid   coughing clear mucus up Frequency: worse over weekend Pertinent Negatives: Patient denies  Disposition: [] ED /[] Urgent Care (no appt availability in office) / [x] Appointment(In office/virtual)/ []  [facility]/ [] Home Care/ [] Refused Recommended Disposition /[] [facility]/ []  Follow-up with PCP Additional Notes: Dr. Ky Barban is going to call him at 9:40 this morning since no in office appts available.   I instructed pt to go to the ED if his symbols got worse.

## 2021-04-06 NOTE — Progress Notes (Signed)
Virtual Visit via Telephone Note  I connected with Tony Williams on 04/06/21 at  9:40 AM EST by telephone and verified that I am speaking with the correct person using two identifiers.  Location: Patient: home Provider: [facility]   I discussed the limitations, risks, security and privacy concerns of performing an evaluation and management service by telephone and the availability of in person appointments. I also discussed with the patient that there may be a patient responsible charge related to this service. The patient expressed understanding and agreed to proceed.   History of Present Illness:  COVID-19 - seen previously 1/19 for same, COVID+ 1/19. Given molnupiravir, today is last day.  - also restarted/increased lisinopril at that time due to higher BP. Has been 180s SBP recently. No vision changes. Some headache. - h/o COPD on breztri, good compliance. Using albuterol 3-4x per day. SpO2 95%.  Fever: no Cough: yes, productive Shortness of breath: yes Chest pain: no Chest tightness: yes Chest congestion: yes Nasal congestion: yes Sinus pressure: yes Headache: yes Ear pressure: yes bilateral    Observations/Objective:  Patient had trouble connecting to video visit, entirety of visit conducted over the phone.  Speaks in full sentences, no respiratory distress. Congested sounding.   Assessment and Plan:  COVID-19 Moderate sx. Finishing molnupiravir today. Will provide steroid burst for COPD exacerbation. Suspect higher BP may be 2/2 pain/symptoms. Advised to monitor BP over next few days, if still elevated after starting steroids, call back, may need further antihypertensive dose adjustment. Reviewed emergency precautions.     I discussed the assessment and treatment plan with the patient. The patient was provided an opportunity to ask questions and all were answered. The patient agreed with the plan and demonstrated an understanding of the instructions.   The patient was  advised to call back or seek an in-person evaluation if the symptoms worsen or if the condition fails to improve as anticipated.  I provided 7 minutes of non-face-to-face time during this encounter.   Myles Gip, DO

## 2021-04-06 NOTE — Telephone Encounter (Signed)
LVM for [facility] to call this patient and rescheule his Sleep Study

## 2021-04-13 ENCOUNTER — Ambulatory Visit: Payer: Self-pay | Admitting: *Deleted

## 2021-04-13 NOTE — Telephone Encounter (Signed)
Noted  

## 2021-04-13 NOTE — Telephone Encounter (Signed)
Summary: Advice for Congestion, pain right ear, and sore throat   Tested postive for COVID on 04/02/21 with sx that include Congestion, pain in Right ear, Sore throat . Pt is wanting advice on what he can do. Pt has appt in Office on 04/16/21. But is requesting an antibiotic before then. No available in office or virtual appts available before 04/16/21      Chief Complaint: ear pain Symptoms: earache, ST Frequency: constant Pertinent Negatives: Patient denies  Disposition: [] ED /[] Urgent Care (no appt availability in office) / [x] Appointment(In office/virtual)/ []  [facility]/ [] Home Care/ [] Refused Recommended Disposition /[] [facility]/ []  Follow-up with PCP Additional Notes: Pt has in person visit later in week but did not want to wait that long. Made virtual visit for tomorrow morning. Pt to keep upcoming visit also.  Reason for Disposition  Earache persists > 1 hour  Answer Assessment - Initial Assessment Questions 1. LOCATION: "Which ear is involved?"       Right ear only 2. SENSATION: "Describe how the ear feels." (e.g. stuffy, full, plugged)."      Painful and stuffed 3. ONSET:  "When did the ear symptoms start?"       Started 10 days ago 4. PAIN: "Do you also have an earache?" If Yes, ask: "How bad is it?" (Scale 1-10; or mild, moderate, severe)     6 5. CAUSE: "What do you think is causing the ear congestion?"     Had COVID and never got well 6. URI: "Do you have a runny nose or cough?"      Slightly yellow 7. NASAL ALLERGIES: "Are there symptoms of hay fever, such as sneezing or a clear nasal discharge?"     no 8. PREGNANCY: "Is there any chance you are pregnant?" "When was your last menstrual period?"     no  Protocols used: Ear - Congestion-A-AH

## 2021-04-14 ENCOUNTER — Telehealth (INDEPENDENT_AMBULATORY_CARE_PROVIDER_SITE_OTHER): Payer: Medicare Other | Admitting: Physician Assistant

## 2021-04-14 ENCOUNTER — Other Ambulatory Visit: Payer: Self-pay

## 2021-04-14 ENCOUNTER — Encounter: Payer: Self-pay | Admitting: Physician Assistant

## 2021-04-14 DIAGNOSIS — H9209 Otalgia, unspecified ear: Secondary | ICD-10-CM

## 2021-04-14 DIAGNOSIS — I152 Hypertension secondary to endocrine disorders: Secondary | ICD-10-CM

## 2021-04-14 DIAGNOSIS — E1159 Type 2 diabetes mellitus with other circulatory complications: Secondary | ICD-10-CM

## 2021-04-14 NOTE — Progress Notes (Signed)
Virtual telephone visit    Virtual Visit via Telephone Note   This visit type was conducted due to national recommendations for restrictions regarding the COVID-19 Pandemic (e.g. social distancing) in an effort to limit this patient's exposure and mitigate transmission in our community. Due to his co-morbid illnesses, this patient is at least at moderate risk for complications without adequate follow up. This format is felt to be most appropriate for this patient at this time. The patient did not have access to video technology or had technical difficulties with video requiring transitioning to audio format only (telephone). Physical exam was limited to content and character of the telephone converstion.    Patient location: home in [place], [place] Provider location: [facility], [place], [place]  I discussed the limitations of evaluation and management by telemedicine and the availability of in person appointments. The patient expressed understanding and agreed to proceed.   Visit Date: 04/14/2021  Today's healthcare provider: Dani Gobble Mecum, PA-C  Introduced myself to the patient as a PA-C and provided education on APPs in clinical practice.    Chief Complaint  Patient presents with   Ear Pain   Subjective      Report he was diagnosed with COVID 12 days ago and ear symptoms began approx 10 days ago States he has associated cough, congestion and sore throat Denies hearing loss, fevers States he is still coughing from recent illness He is taking Tylenol and Advil for symptoms  States he has an apt with Dr. Jacinto Reap on Thurs and is amenable to waiting for in office eval if pain does not get worse States he feels like it is improving since yesterday  Has not taken anything for congestion, cough such as Dayquil, Coricidin, etc.      Medications: Outpatient Medications Prior to Visit  Medication Sig   albuterol (VENTOLIN HFA) 108 (90 Base) MCG/ACT inhaler Inhale 2 puffs  into the lungs every 6 (six) hours as needed for wheezing or shortness of breath.   allopurinol (ZYLOPRIM) 300 MG tablet Take 1 tablet (300 mg total) by mouth daily.   aspirin EC 81 MG tablet Take 81 mg by mouth daily. Swallow whole.   blood glucose meter kit and supplies Dispense based on patient and insurance preference. Use up to four times daily as directed. (FOR ICD-10 E10.9, E11.9). check blood glucose fasting and before meals four times daily.   Budeson-Glycopyrrol-Formoterol (BREZTRI AEROSPHERE) 160-9-4.8 MCG/ACT AERO Inhale 2 puffs into the lungs 2 (two) times daily.   Cholecalciferol (VITAMIN D3 PO) Take 4,000 Units by mouth.   clonazePAM (KLONOPIN) 0.5 MG tablet Take 1 tablet (0.5 mg total) by mouth at bedtime as needed for anxiety.   Coenzyme Q10 (CO Q-10) 400 MG CAPS Take by mouth daily at 6 (six) AM.   desoximetasone (TOPICORT) 0.25 % cream Apply 1 application topically 2 (two) times daily.   escitalopram (LEXAPRO) 20 MG tablet Take 1 tablet (20 mg total) by mouth daily.   gabapentin (NEURONTIN) 300 MG capsule Take 1 capsule (300 mg total) by mouth 2 (two) times daily AND 2 capsules (600 mg total) at bedtime.   lisinopril (ZESTRIL) 10 MG tablet Take 1 tablet (10 mg total) by mouth daily.   lovastatin (MEVACOR) 40 MG tablet Take 40 mg by mouth at bedtime.   Omega-3 Fatty Acids (FISH OIL) 1200 MG CAPS Take by mouth daily at 6 (six) AM.   ticagrelor (BRILINTA) 90 MG TABS tablet Take 1 tablet (90 mg total)  by mouth 2 (two) times daily.   torsemide (DEMADEX) 20 MG tablet Take 1 tablet (20 mg total) by mouth daily as needed.   No facility-administered medications prior to visit.    Review of Systems  HENT:  Positive for ear pain, rhinorrhea and sore throat. Negative for ear discharge and hearing loss.   Respiratory:  Positive for cough.   Gastrointestinal:  Negative for abdominal pain, diarrhea and vomiting.  Musculoskeletal:  Negative for neck pain.  Skin:  Negative for rash.   Neurological:  Positive for headaches.     Objective    There were no vitals taken for this visit.   Due to limitations of telephone visit, I am unable to make objective observations other than audible notes: Patient is able to carry on conversation normally with intermittent coughing Patient appears alert and oriented to self, day, time and location    Assessment & Plan     1. Otalgia, unspecified laterality  Acute, new problem, appears stable at this time  Likely secondary to recent COVID induced URI symptoms but unable to assess via telephone visit Discussed OTC measures he can use to help with pain and to relieve congestion, sore throat with goal of further reducing ear pain Recommended he refrain from NSAIDs due to most recent GFR of <50 Patient has apt in office on Thursday- discussed recommendation for physical evaluation at that time.   No follow-ups on file.    I discussed the assessment and treatment plan with the patient. The patient was provided an opportunity to ask questions and all were answered. The patient agreed with the plan and demonstrated an understanding of the instructions.   The patient was advised to call back or seek an in-person evaluation if the symptoms worsen or if the condition fails to improve as anticipated.  I provided 7 minutes of non-face-to-face time during this encounter.  The entirety of the information documented in the History of Present Illness, Review of Systems and Physical Exam were personally obtained by me. Portions of this information were initially documented by the CMA and reviewed by me for thoroughness and accuracy.   Dani Gobble Mecum, PA-C   Almon Register, PA-C [employer] [phone] (phone) [phone] (fax)  [place]

## 2021-04-16 ENCOUNTER — Other Ambulatory Visit: Payer: Self-pay

## 2021-04-16 ENCOUNTER — Encounter: Payer: Self-pay | Admitting: Family Medicine

## 2021-04-16 ENCOUNTER — Ambulatory Visit (INDEPENDENT_AMBULATORY_CARE_PROVIDER_SITE_OTHER): Payer: Medicare Other | Admitting: Family Medicine

## 2021-04-16 VITALS — BP 148/70 | HR 61 | Temp 98.6°F | Resp 16 | Wt 260.4 lb

## 2021-04-16 DIAGNOSIS — E1159 Type 2 diabetes mellitus with other circulatory complications: Secondary | ICD-10-CM | POA: Diagnosis not present

## 2021-04-16 DIAGNOSIS — I152 Hypertension secondary to endocrine disorders: Secondary | ICD-10-CM

## 2021-04-16 DIAGNOSIS — M25562 Pain in left knee: Secondary | ICD-10-CM

## 2021-04-16 DIAGNOSIS — G8929 Other chronic pain: Secondary | ICD-10-CM

## 2021-04-16 DIAGNOSIS — F5104 Psychophysiologic insomnia: Secondary | ICD-10-CM | POA: Diagnosis not present

## 2021-04-16 DIAGNOSIS — M25561 Pain in right knee: Secondary | ICD-10-CM

## 2021-04-16 DIAGNOSIS — F411 Generalized anxiety disorder: Secondary | ICD-10-CM

## 2021-04-16 DIAGNOSIS — E1142 Type 2 diabetes mellitus with diabetic polyneuropathy: Secondary | ICD-10-CM | POA: Diagnosis not present

## 2021-04-16 MED ORDER — HYDROCODONE-ACETAMINOPHEN 5-325 MG PO TABS: 1.0000 | ORAL_TABLET | Freq: Four times a day (QID) | ORAL | 0 refills | Status: DC | PRN

## 2021-04-16 MED ORDER — TRAZODONE HCL 150 MG PO TABS: 150.0000 mg | ORAL_TABLET | Freq: Every day | ORAL | 1 refills | Status: DC

## 2021-04-16 NOTE — Progress Notes (Signed)
Established patient visit   Patient: Tony Williams   DOB: 1946-02-02   76 y.o. Male  MRN: [id number] Visit Date: 04/16/2021  Today's healthcare provider: [name], MD   I,[name],acting as a scribe for [name], MD.,have documented all relevant documentation on the behalf of [name], MD,as directed by  [name], MD while in the presence of [name], MD.   Chief Complaint  Patient presents with   follow-up anciety and depression   Subjective    HPI  Anxiety and Depression, Follow-up  He was last seen for anxiety 3 months ago. Changes made at last visit include referral to psych placed. He still taking Lexapro.  He feels his anxiety is mild and Improved since last visit.  No longer taking Klonopin. Resumed 1 pain pill per day and 1 trazodone qhs  Symptoms: No chest pain No difficulty concentrating  No dizziness Yes fatigue  No feelings of losing control Yes insomnia  No irritable No palpitations  No panic attacks No racing thoughts  Yes shortness of breath No sweating  No tremors/shakes    GAD-7 Results GAD-7 Generalized Anxiety Disorder Screening Tool 04/16/2021 01/29/2021  1. Feeling Nervous, Anxious, or on Edge 0 3  2. Not Being Able to Stop or Control Worrying 1 3  3. Worrying Too Much About Different Things 0 3  4. Trouble Relaxing 0 3  5. Being So Restless it's Hard To Sit Still - 3  6. Becoming Easily Annoyed or Irritable 0 3  7. Feeling Afraid As If Something Awful Might Happen 0 0  Total GAD-7 Score - 18  Difficulty At Work, Home, or Getting  Along With Others? Not difficult at all Somewhat difficult    PHQ-9 Scores PHQ9 SCORE ONLY 04/16/2021 02/26/2021 01/29/2021  PHQ-9 Total Score 5 9 13     ---------------------------------------------------------------------------------------------------   Medications: Outpatient Medications Prior to Visit  Medication Sig   albuterol (VENTOLIN HFA) 108 (90  Base) MCG/ACT inhaler Inhale 2 puffs into the lungs every 6 (six) hours as needed for wheezing or shortness of breath.   allopurinol (ZYLOPRIM) 300 MG tablet Take 1 tablet (300 mg total) by mouth daily.   aspirin EC 81 MG tablet Take 81 mg by mouth daily. Swallow whole.   blood glucose meter kit and supplies Dispense based on patient and insurance preference. Use up to four times daily as directed. (FOR ICD-10 E10.9, E11.9). check blood glucose fasting and before meals four times daily.   Budeson-Glycopyrrol-Formoterol (BREZTRI AEROSPHERE) 160-9-4.8 MCG/ACT AERO Inhale 2 puffs into the lungs 2 (two) times daily.   Cholecalciferol (VITAMIN D3 PO) Take 4,000 Units by mouth.   Coenzyme Q10 (CO Q-10) 400 MG CAPS Take by mouth daily at 6 (six) AM.   desoximetasone (TOPICORT) 0.25 % cream Apply 1 application topically 2 (two) times daily.   escitalopram (LEXAPRO) 20 MG tablet Take 1 tablet (20 mg total) by mouth daily.   gabapentin (NEURONTIN) 300 MG capsule Take 1 capsule (300 mg total) by mouth 2 (two) times daily AND 2 capsules (600 mg total) at bedtime.   lisinopril (ZESTRIL) 10 MG tablet Take 1 tablet (10 mg total) by mouth daily.   lovastatin (MEVACOR) 40 MG tablet Take 40 mg by mouth at bedtime.   Omega-3 Fatty Acids (FISH OIL) 1200 MG CAPS Take by mouth daily at 6 (six) AM.   ticagrelor (BRILINTA) 90 MG TABS tablet Take 1 tablet (90 mg total) by mouth 2 (two) times daily.  torsemide (DEMADEX) 20 MG tablet Take 1 tablet (20 mg total) by mouth daily as needed.   [DISCONTINUED] clonazePAM (KLONOPIN) 0.5 MG tablet Take 1 tablet (0.5 mg total) by mouth at bedtime as needed for anxiety.   No facility-administered medications prior to visit.    Review of Systems per HPI     Objective    BP (!) 148/70 (BP Location: Left Arm, Patient Position: Sitting, Cuff Size: Large) Comment: manual   Pulse 61    Temp 98.6 F (37 C) (Oral)    Resp 16    Wt 260 lb 6.4 oz (118.1 kg)    BMI 38.45 kg/m  {Show  previous vital signs (optional):23777}  Physical Exam Vitals reviewed.  Constitutional:      General: He is not in acute distress.    Appearance: Normal appearance. He is not diaphoretic.  HENT:     Head: Normocephalic and atraumatic.  Eyes:     General: No scleral icterus.    Conjunctiva/sclera: Conjunctivae normal.  Cardiovascular:     Rate and Rhythm: Normal rate and regular rhythm.     Pulses: Normal pulses.     Heart sounds: Normal heart sounds. No murmur heard. Pulmonary:     Effort: Pulmonary effort is normal. No respiratory distress.     Breath sounds: Normal breath sounds. No wheezing or rhonchi.  Musculoskeletal:     Cervical back: Neck supple.     Right lower leg: No edema.     Left lower leg: No edema.  Lymphadenopathy:     Cervical: No cervical adenopathy.  Skin:    General: Skin is warm and dry.     Findings: No rash.  Neurological:     Mental Status: He is alert and oriented to person, place, and time.  Psychiatric:        Mood and Affect: Mood normal.        Behavior: Behavior normal.      No results found for any visits on 04/16/21.  Assessment & Plan     Problem List Items Addressed This Visit       Cardiovascular and Mediastinum   Hypertension associated with diabetes ([place])    BP elevated here today Will monitor home BPs Upcoming cardiology appt for f/u        Endocrine   Diabetic polyneuropathy associated with type 2 diabetes mellitus ([place])    Chronic and fairly well controlled Has resumed Norco at 1 pill per day - wil lsign pain contract - also for chronic knee pain Continue gabapentin      Relevant Medications   traZODone (DESYREL) 150 MG tablet     Other   Chronic pain of both knees    Chronic and stable Resumed Norco at only 1 tab daily - will sign pain contract       Relevant Medications   HYDROcodone-acetaminophen (NORCO/VICODIN) 5-325 MG tablet   traZODone (DESYREL) 150 MG tablet   GAD (generalized anxiety disorder) -  Primary    Chronic and fairly well controlled Continue lexapro at current dose      Relevant Medications   traZODone (DESYREL) 150 MG tablet   Psychophysiological insomnia    Chronic and fairly well controlled Back on trazodone No klonopin        Return in about 3 months (around 07/14/2021) for CPE, AWV.      I, [name], MD, have reviewed all documentation for this visit. The documentation on 04/16/21 for the exam, diagnosis, procedures, and orders  are all accurate and complete.   Elane Peabody, Dionne Bucy, MD, MPH [facility]

## 2021-04-16 NOTE — Assessment & Plan Note (Signed)
Chronic and stable Resumed Norco at only 1 tab daily - will sign pain contract

## 2021-04-16 NOTE — Assessment & Plan Note (Signed)
Chronic and fairly well controlled Back on trazodone No klonopin

## 2021-04-16 NOTE — Assessment & Plan Note (Signed)
Chronic and fairly well controlled Continue lexapro at current dose

## 2021-04-16 NOTE — Assessment & Plan Note (Signed)
BP elevated here today Will monitor home BPs Upcoming cardiology appt for f/u

## 2021-04-16 NOTE — Assessment & Plan Note (Addendum)
Chronic and fairly well controlled Has resumed Norco at 1 pill per day - wil lsign pain contract - also for chronic knee pain Continue gabapentin

## 2021-04-21 ENCOUNTER — Ambulatory Visit (INDEPENDENT_AMBULATORY_CARE_PROVIDER_SITE_OTHER): Payer: Medicare Other | Admitting: *Deleted

## 2021-04-21 DIAGNOSIS — M25561 Pain in right knee: Secondary | ICD-10-CM

## 2021-04-21 DIAGNOSIS — F411 Generalized anxiety disorder: Secondary | ICD-10-CM

## 2021-04-21 DIAGNOSIS — G8929 Other chronic pain: Secondary | ICD-10-CM

## 2021-04-21 DIAGNOSIS — I152 Hypertension secondary to endocrine disorders: Secondary | ICD-10-CM

## 2021-04-21 DIAGNOSIS — E1159 Type 2 diabetes mellitus with other circulatory complications: Secondary | ICD-10-CM

## 2021-04-21 NOTE — Patient Instructions (Signed)
Visit Information  Thank you for taking time to visit with me today. Please don't hesitate to contact me if I can be of assistance to you before our next scheduled telephone appointment.  Following are the goals we discussed today:   - begin personal counseling - practice relaxation or meditation daily - talk about feelings with a friend, family or spiritual advisor - practice positive thinking and self-talk  -Continue to consider follow up with [facility] if needed  If you are experiencing a Mental Health or [place] or need someone to talk to, please call the Suicide and Crisis Lifeline: 988   Patient verbalizes understanding of instructions and care plan provided today and agrees to view in [place]. Active MyChart status confirmed with patient.    No further follow up required: Patient to contact this Education officer, museum with any additional community resource needs   [employer], [place] Worker  [facility] [phone]

## 2021-04-21 NOTE — Chronic Care Management (AMB) (Signed)
Chronic Care Management    Clinical Social Work Note  04/21/2021 Name: Tony Williams MRN: [id number] DOB: 1945/10/24  Tony Williams is a 76 y.o. year old male who is a primary care patient of Bacigalupo, Dionne Bucy, MD. The CCM team was consulted to assist the patient with chronic disease management and/or care coordination needs related to: Mental Health Counseling and Resources.   Engaged with patient by telephone for follow up visit in response to provider referral for social work chronic care management and care coordination services.   Consent to Services:  The patient was given information about Chronic Care Management services, agreed to services, and gave verbal consent prior to initiation of services.  Please see initial visit note for detailed documentation.   Patient agreed to services and consent obtained.   Assessment: Review of patient past medical history, allergies, medications, and health status, including review of relevant consultants reports was performed today as part of a comprehensive evaluation and provision of chronic care management and care coordination services.     SDOH (Social Determinants of Health) assessments and interventions performed:    Advanced Directives Status: Not addressed in this encounter.  CCM Care Plan  Allergies  Allergen Reactions   Butorphanol Itching   Morphine Other (See Comments)    Redness around IV site   Pedi-Pre Tape Spray [Wound Dressing Adhesive] Itching   Simvastatin Other (See Comments)    "Destroyed my muscles"   Statins Other (See Comments)    "Destroyed my muscles"    Outpatient Encounter Medications as of 04/21/2021  Medication Sig   albuterol (VENTOLIN HFA) 108 (90 Base) MCG/ACT inhaler Inhale 2 puffs into the lungs every 6 (six) hours as needed for wheezing or shortness of breath.   allopurinol (ZYLOPRIM) 300 MG tablet Take 1 tablet (300 mg total) by mouth daily.   aspirin EC 81 MG tablet Take 81 mg by mouth  daily. Swallow whole.   blood glucose meter kit and supplies Dispense based on patient and insurance preference. Use up to four times daily as directed. (FOR ICD-10 E10.9, E11.9). check blood glucose fasting and before meals four times daily.   Budeson-Glycopyrrol-Formoterol (BREZTRI AEROSPHERE) 160-9-4.8 MCG/ACT AERO Inhale 2 puffs into the lungs 2 (two) times daily.   Cholecalciferol (VITAMIN D3 PO) Take 4,000 Units by mouth.   Coenzyme Q10 (CO Q-10) 400 MG CAPS Take by mouth daily at 6 (six) AM.   desoximetasone (TOPICORT) 0.25 % cream Apply 1 application topically 2 (two) times daily.   escitalopram (LEXAPRO) 20 MG tablet Take 1 tablet (20 mg total) by mouth daily.   gabapentin (NEURONTIN) 300 MG capsule Take 1 capsule (300 mg total) by mouth 2 (two) times daily AND 2 capsules (600 mg total) at bedtime.   HYDROcodone-acetaminophen (NORCO/VICODIN) 5-325 MG tablet Take 1 tablet by mouth every 6 (six) hours as needed for moderate pain.   lisinopril (ZESTRIL) 10 MG tablet Take 1 tablet (10 mg total) by mouth daily.   lovastatin (MEVACOR) 40 MG tablet Take 40 mg by mouth at bedtime.   Omega-3 Fatty Acids (FISH OIL) 1200 MG CAPS Take by mouth daily at 6 (six) AM.   ticagrelor (BRILINTA) 90 MG TABS tablet Take 1 tablet (90 mg total) by mouth 2 (two) times daily.   torsemide (DEMADEX) 20 MG tablet Take 1 tablet (20 mg total) by mouth daily as needed.   traZODone (DESYREL) 150 MG tablet Take 1 tablet (150 mg total) by mouth at bedtime.   No facility-administered encounter  medications on file as of 04/21/2021.    Patient Active Problem List   Diagnosis Date Noted   Daytime sleepiness 02/12/2021   Hospital discharge follow-up 12/16/2020   Idiopathic hypotension 12/16/2020   Hx of myocardial infarction 12/16/2020   Bradycardia 12/16/2020   Dizziness 12/16/2020   Hyperkalemia    Symptomatic sinus bradycardia 12/08/2020   Dyspnea 11/15/2020   Acute on chronic diastolic CHF (congestive heart  failure) ([place]) 11/10/2020   NSTEMI (non-ST elevated myocardial infarction) ([place]) 11/08/2020   Polyp of transverse colon    Morbid obesity ([place]) 07/21/2020   Chronic pain of both knees 07/21/2020   Diabetic polyneuropathy associated with type 2 diabetes mellitus ([place]) 07/21/2020   Stage 3a chronic kidney disease ([place]) 07/21/2020   Chronic gout due to renal impairment without tophus 07/21/2020   Hypertension associated with diabetes ([place]) 07/21/2020   Hyperlipidemia associated with type 2 diabetes mellitus ([place]) 07/21/2020   GAD (generalized anxiety disorder) 07/21/2020   Psychophysiological insomnia 07/21/2020   Screening for blood or protein in urine 04/18/2020   Vitamin D insufficiency 04/18/2020   Chronic obstructive pulmonary disease ([place]) 04/18/2020   Controlled type 2 diabetes mellitus with diabetic polyneuropathy, without long-term current use of insulin ([place]) 04/18/2020   Coronary artery disease with hx of myocardial infarct w/o hx of CABG 04/18/2020   S/P primary angioplasty with coronary stent 04/18/2020   History of smoking at least 1 pack per day for at least 30 years 04/18/2020    Conditions to be addressed/monitored:  Anxiety and Psychophysiological Insomnia ; Mental Health Concerns  Care Plan : General Social Work (Adult)  Updates made by Vern Claude, LCSW since 04/21/2021 12:00 AM     Problem: CHL AMB "PATIENT-SPECIFIC PROBLEM"   Note:   CARE PLAN ENTRY (see longitudinal plan of care for additional care plan information)  Current Barriers:  Knowledge deficits related to accessing mental health provider in patient with Anxiety  Patient is experiencing symptoms of  anxiety evidenced by difficulty sleeping, mind racing-specifically at night, patient would like to continue Klonopin and is requesting dosage increase Patient agreeable to follow up with a psychiatrist however has declined follow up with a therapist at this time Patient needs Support, Education, and Care  Coordination in order to meet unmet mental health needs  Mental Health Concerns   Clinical Social Work Goal(s):  Over the next 90 days, patient will work with SW bi-weekly by telephone or in person to reduce or manage symptoms of anxiety until connected for ongoing counseling resources.  Patient will implement clinical interventions discussed today to decreases symptoms of anxiety and increase knowledge and/or ability of: self-management skills.  Interventions:  Assessed patient's understanding, education, previous treatment and care coordination needs  Provided basic mental health support, education and interventions  Patient discussed improvement in mood evidenced by ability to sleep through the night, mind racing-minimal Discussed options for long term counseling based on need and insurance-patient continues to decline this option, stating that due to improvement in mood, no longer feels need for mental health follow up  Reviewed mental health medications with patient prescribed by PCP and discussed compliance  Other interventions include: Motivational Interviewing employed Teacher, adult education of feelings encouraged   Assessed for additional community resource needs-patient verbalized having no additional needs at this time  Patient Self Care Activities & Deficits:  Patient is unable to independently navigate community resource options without care coordination support Self administers medications as prescribed Attends all scheduled provider appointments Performs ADL's independently Performs IADL's  independently Strong family or social support  Please see past updates related to this goal by clicking on the "Past Updates" button in the selected goal        Follow Up Plan: Client will contact this social worker with any additional community resource needs.       Elliot Gurney, [place] Worker  [facility] [phone]

## 2021-04-24 ENCOUNTER — Encounter: Payer: Self-pay | Admitting: Cardiology

## 2021-04-24 ENCOUNTER — Ambulatory Visit: Payer: Medicare Other | Admitting: Cardiology

## 2021-04-24 ENCOUNTER — Other Ambulatory Visit: Payer: Self-pay

## 2021-04-24 ENCOUNTER — Telehealth: Payer: Self-pay

## 2021-04-24 VITALS — BP 130/80 | HR 74 | Ht 69.0 in | Wt 261.0 lb

## 2021-04-24 DIAGNOSIS — E7800 Pure hypercholesterolemia, unspecified: Secondary | ICD-10-CM

## 2021-04-24 DIAGNOSIS — R0602 Shortness of breath: Secondary | ICD-10-CM | POA: Diagnosis not present

## 2021-04-24 DIAGNOSIS — I2510 Atherosclerotic heart disease of native coronary artery without angina pectoris: Secondary | ICD-10-CM

## 2021-04-24 DIAGNOSIS — I10 Essential (primary) hypertension: Secondary | ICD-10-CM

## 2021-04-24 MED ORDER — PRASUGREL HCL 10 MG PO TABS: 10.0000 mg | ORAL_TABLET | Freq: Every day | ORAL | 5 refills | Status: DC

## 2021-04-24 NOTE — Telephone Encounter (Signed)
Jasmine from [place] called in reference to [facility] for Varina. She would like to know if medication will be used in conjunction with any other medication such as Praluent. However while reviewing the chart it does not appear patient is still taking Repatha. Please clarify.

## 2021-04-24 NOTE — Telephone Encounter (Signed)
Called [employer] -- Spent 15+ minutes on the phone to cancel this PA.   Prior PA was submitted for Repatha and patient still could not afford the medication therefore he was changed to a different medication.

## 2021-04-24 NOTE — Progress Notes (Signed)
Cardiology Office Note:    Date:  04/24/2021   ID:  Tony Williams, DOB 1946-01-18, MRN [id number]  PCP:  Tony Williams, Tony Williams  Cardiologist:  Tony Sable, MD  Advanced Practice Provider:  No care team member to display Electrophysiologist:  None       Referring MD: Tony Crews, MD   Chief Complaint  Patient presents with   Other    3 month follow up -- Patient c/o SOB but has COPD. Meds reviewed verbally with patient.      History of Present Illness:    Tony Williams is a 76 y.o. male with a hx of CAD/CABG x 4 2007, PCI x 2 2018, PCI x 1 SVG-LAD/Diagonal 2022, former smoker, possible COPD, OSA, obesity diabetes who presents for follow-up.  Previously seen due to dizziness deemed secondary to orthostasis. Takes torsemide as needed.  Still has shortness of breath with exertion, also has COPD and pulmonary scarring.  Patient dizziness is much improved, he is able to walk without using a walker, denies dizziness.  Takes lisinopril 10 mg daily.  Patient states cost for Brilinta is too high.   Prior notes Limited echo 11/2020 EF 60 to 65%, indeterminate diastolic function Echo 10/996 normal systolic function, indeterminate diastolic function, EF 60 to 65%. Lexiscan Myoview, fixed perfusion defect, no evidence for ischemia. Patient recently moved to the area from [facility].  States having CAD/CABG x4 back in 2007, in 2018 had a myocardial infarction, had 2 stents placed.  He is intolerant to statins, has tried multiple statins and not able to tolerate due to myalgias.  Not tolerant to Zetia.  Currently able to tolerate low-dose lovastatin.    Had sleep study in the past and was diagnosed with OSA, he declines using his CPAP mask.  Does not like the feel for the mask.    Past Medical History:  Diagnosis Date   CAD S/P percutaneous coronary angioplasty    CAD, multiple vessel    Cancer ([place]) 2013   Bladder    Chronic kidney disease    COPD (chronic obstructive pulmonary disease) (HCC)    Depression    Diabetes mellitus without complication (HCC)    Heart problem    Hx of CABG x 4    LIMA-D2, SVG-OM (occluded), SeqSVG-D1-LAD.   Myocardial infarction [facility])     Past Surgical History:  Procedure Laterality Date   CHOLECYSTECTOMY, LAPAROSCOPIC  12/26/1995   COLONOSCOPY WITH PROPOFOL N/A 09/23/2020   Procedure: COLONOSCOPY WITH PROPOFOL;  Surgeon: Lucilla Lame, MD;  Location: [facility] ENDOSCOPY;  Service: Endoscopy;  Laterality: N/A;   CORONARY ARTERY BYPASS GRAFT     LIMA-D2, SVG-OM, SeqSVG-D1-LAD   CORONARY STENT INTERVENTION N/A 11/12/2020   Procedure: CORONARY STENT INTERVENTION;  Surgeon: Wellington Hampshire, MD;  Location: [facility] CV LAB;  Service: Cardiovascular;  Laterality: N/A;   EYE SURGERY     IABP INSERTION N/A 11/11/2020   Procedure: IABP Insertion;  Surgeon: Nelva Bush, MD;  Location: [facility] CV LAB;  Service: Cardiovascular;  Laterality: N/A;   IR STENT PLACEMENT ANTE CAROTID INC ANGIO     KNEE SURGERY     RIGHT/LEFT HEART CATH AND CORONARY/GRAFT ANGIOGRAPHY N/A 11/11/2020   Procedure: RIGHT/LEFT HEART CATH AND CORONARY/GRAFT ANGIOGRAPHY;  Surgeon: Nelva Bush, MD;  Location: [facility] CV LAB;  Service: Cardiovascular;  Severe native CAD: 90% dLMCA-100% mLAD/100% ost LCx CTO w/ Severe diffuse RCA disease. Widely patent LIMA-D1. Patent sequential  SVG-D2-LAD with 99% mid graft & 90% LIMA-D2 @ anastomosis. previously stented SVG-OM - CTO. Mildly   TRANSURETHRAL RESECTION OF PROSTATE  1994   URINARY SPHINCTER IMPLANT  1995    Current Medications: Current Meds  Medication Sig   albuterol (VENTOLIN HFA) 108 (90 Base) MCG/ACT inhaler Inhale 2 puffs into the lungs every 6 (six) hours as needed for wheezing or shortness of breath.   allopurinol (ZYLOPRIM) 300 MG tablet Take 1 tablet (300 mg total) by mouth daily.   aspirin EC 81 MG tablet Take 81 mg by mouth daily.  Swallow whole.   blood glucose meter kit and supplies Dispense based on patient and insurance preference. Use up to four times daily as directed. (FOR ICD-10 E10.9, E11.9). check blood glucose fasting and before meals four times daily.   Budeson-Glycopyrrol-Formoterol (BREZTRI AEROSPHERE) 160-9-4.8 MCG/ACT AERO Inhale 2 puffs into the lungs 2 (two) times daily.   Cholecalciferol (VITAMIN D3 PO) Take 4,000 Units by mouth.   Coenzyme Q10 (CO Q-10) 400 MG CAPS Take by mouth daily at 6 (six) AM.   desoximetasone (TOPICORT) 0.25 % cream Apply 1 application topically 2 (two) times daily.   escitalopram (LEXAPRO) 20 MG tablet Take 1 tablet (20 mg total) by mouth daily.   gabapentin (NEURONTIN) 300 MG capsule Take 1 capsule (300 mg total) by mouth 2 (two) times daily AND 2 capsules (600 mg total) at bedtime.   HYDROcodone-acetaminophen (NORCO/VICODIN) 5-325 MG tablet Take 1 tablet by mouth every 6 (six) hours as needed for moderate pain.   lisinopril (ZESTRIL) 10 MG tablet Take 1 tablet (10 mg total) by mouth daily.   lovastatin (MEVACOR) 40 MG tablet Take 40 mg by mouth at bedtime.   Omega-3 Fatty Acids (FISH OIL) 1200 MG CAPS Take by mouth daily at 6 (six) AM.   prasugrel (EFFIENT) 10 MG TABS tablet Take 1 tablet (10 mg total) by mouth daily.   torsemide (DEMADEX) 20 MG tablet Take 1 tablet (20 mg total) by mouth daily as needed.   traZODone (DESYREL) 150 MG tablet Take 1 tablet (150 mg total) by mouth at bedtime.   [DISCONTINUED] ticagrelor (BRILINTA) 90 MG TABS tablet Take 1 tablet (90 mg total) by mouth 2 (two) times daily.     Allergies:   Butorphanol, Morphine, Pedi-pre tape spray [wound dressing adhesive], Simvastatin, and Statins   Social History   Socioeconomic History   Marital status: Married    Spouse name: Not on file   Number of children: 2   Years of education: Not on file   Highest education level: High school graduate  Occupational History   Occupation: retired  Tobacco Use    Smoking status: Former    Types: Cigarettes    Quit date: 10/23/2003    Years since quitting: 17.5   Smokeless tobacco: Never  Vaping Use   Vaping Use: Never used  Substance and Sexual Activity   Alcohol use: Never   Drug use: Never   Sexual activity: Not on file  Other Topics Concern   Not on file  Social History Narrative   Not on file   Social Determinants of Health   Financial Resource Strain: Low Risk    Difficulty of Paying Living Expenses: Not very hard  Food Insecurity: No Food Insecurity   Worried About Running Out of Food in the Last Year: Never true   [place] in the Last Year: Never true  Transportation Needs: No Transportation Needs   Lack of Transportation (  Medical): No   Lack of Transportation (Non-Medical): No  Physical Activity: Inactive   Days of Exercise per Week: 0 days   Minutes of Exercise per Session: 0 min  Stress: Stress Concern Present   Feeling of Stress : To some extent  Social Connections: Moderately Integrated   Frequency of Communication with Friends and Family: More than three times a week   Frequency of Social Gatherings with Friends and Family: Three times a week   Attends Religious Services: 1 to 4 times per year   Active Member of Clubs or Organizations: No   Attends Archivist Meetings: Never   Marital Status: Married     Family History: The patient's family history includes Heart Problems in his father and mother.  ROS:   Please see the history of present illness.     All other systems reviewed and are negative.  EKGs/Labs/Other Studies Reviewed:    The following studies were reviewed today:   EKG:  EKG is  ordered today.  EKG shows sinus rhythm, heart rate 65  Recent Labs: 12/08/2020: Magnesium 2.4; TSH 2.515 01/26/2021: ALT 10; B Natriuretic Peptide 372.9 03/11/2021: BUN 24; Creatinine, Ser 1.70; Hemoglobin 15.7; Platelets 269; Potassium 4.6; Sodium 134  Recent Lipid Panel    Component Value Date/Time    CHOL 123 11/09/2020 0436   CHOL 156 07/21/2020 1440   TRIG 219 (H) 11/09/2020 0436   HDL 32 (L) 11/09/2020 0436   HDL 34 (L) 07/21/2020 1440   CHOLHDL 3.8 11/09/2020 0436   VLDL 44 (H) 11/09/2020 0436   LDLCALC 47 11/09/2020 0436   LDLCALC 84 07/21/2020 1440     Risk Assessment/Calculations:      Physical Exam:    VS:  BP 130/80 (BP Location: Left Arm, Patient Position: Sitting, Cuff Size: Normal)    Pulse 74    Ht $R'5\' 9"'uq$  (1.753 m)    Wt 261 lb (118.4 kg)    SpO2 94%    BMI 38.54 kg/m     Wt Readings from Last 3 Encounters:  04/24/21 261 lb (118.4 kg)  04/16/21 260 lb 6.4 oz (118.1 kg)  03/11/21 263 lb 7.2 oz (119.5 kg)     GEN:  Well nourished, well developed in no acute distress HEENT: Normal NECK: No JVD; No carotid bruits LYMPHATICS: No lymphadenopathy CARDIAC: RRR, no murmurs, rubs, gallops RESPIRATORY: Diminished breath sounds bilaterally, worse on the right lung ABDOMEN: Soft, non-tender, distended MUSCULOSKELETAL:  No edema; No deformity  SKIN: Warm and dry NEUROLOGIC:  Alert and oriented x 3 PSYCHIATRIC:  Normal affect   ASSESSMENT:    1. Primary hypertension   2. Coronary artery disease involving native coronary artery of native heart, unspecified whether angina present   3. Pure hypercholesterolemia   4. Shortness of breath     PLAN:    In order of problems listed above:  Hypertension, BP appears controlled.  Continue lisinopril.  Okay to let blood pressure run a little higher due to previous history of orthostatic hypotension. History of CABG x4, PCI x2 (10/2020).  Recent PCI x2 to SVG.  Stop Brilinta, start Effient, continue aspirin.  Last echo with EF 60%.  Continue lovastatin. Hyperlipidemia, total cholesterol and LDL at goal.  Continue lovastatin 40 mg daily.  Not tolerant to Zetia or higher doses of statin. Shortness of breath, etiology multifactorial including OSA, COPD, obesity.   Follow-up in 6 months.   Medication Adjustments/Labs and  Tests Ordered: Current medicines are reviewed at length with the  patient today.  Concerns regarding medicines are outlined above.  Orders Placed This Encounter  Procedures   Lipid panel   EKG 12-Lead       Meds ordered this encounter  Medications   prasugrel (EFFIENT) 10 MG TABS tablet    Sig: Take 1 tablet (10 mg total) by mouth daily.    Dispense:  30 tablet    Refill:  5       Patient Instructions  Medication Instructions:   Your physician has recommended you make the following change in your medication:     STOP taking your Brillinta.  2.     START taking Effient 10 MG once a day.  *If you need a refill on your cardiac medications before your next appointment, please call your pharmacy*   Lab Work:  Your physician recommends that you return for a FASTING lipid profile: The week prior to your 3 month follow up.  - You will need to be fasting. Please do not have anything to eat or drink after midnight the morning you have the lab work. You may only have water or black coffee with no cream or sugar.   We will call you closer to your appointment time and schedule you in our office for this lab draw.     Testing/Procedures: None ordered   Follow-Up: At [facility], you and your health needs are our priority.  As part of our continuing mission to provide you with exceptional heart care, we have created designated Provider Care Teams.  These Care Teams include your primary Cardiologist (physician) and Advanced Practice Providers (APPs -  Physician Assistants and Nurse Practitioners) who all work together to provide you with the care you need, when you need it.  We recommend signing up for the patient portal called "MyChart".  Sign up information is provided on this After Visit Summary.  MyChart is used to connect with patients for Virtual Visits (Telemedicine).  Patients are able to view lab/test results, encounter notes, upcoming appointments, etc.  Non-urgent  messages can be sent to your provider as well.   To learn more about what you can do with MyChart, go to [email or web address].    Your next appointment:   6 month(s)  The format for your next appointment:   In Person  Provider:   Kate Sable, MD    Other Instructions     Signed, Tony Sable, MD  04/24/2021 5:03 PM    [place]

## 2021-04-24 NOTE — Patient Instructions (Signed)
Medication Instructions:   Your physician has recommended you make the following change in your medication:     STOP taking your Brillinta.  2.     START taking Effient 10 MG once a day.  *If you need a refill on your cardiac medications before your next appointment, please call your pharmacy*   Lab Work:  Your physician recommends that you return for a FASTING lipid profile: The week prior to your 3 month follow up.  - You will need to be fasting. Please do not have anything to eat or drink after midnight the morning you have the lab work. You may only have water or black coffee with no cream or sugar.   We will call you closer to your appointment time and schedule you in our office for this lab draw.     Testing/Procedures: None ordered   Follow-Up: At [facility], you and your health needs are our priority.  As part of our continuing mission to provide you with exceptional heart care, we have created designated Provider Care Teams.  These Care Teams include your primary Cardiologist (physician) and Advanced Practice Providers (APPs -  Physician Assistants and Nurse Practitioners) who all work together to provide you with the care you need, when you need it.  We recommend signing up for the patient portal called "MyChart".  Sign up information is provided on this After Visit Summary.  MyChart is used to connect with patients for Virtual Visits (Telemedicine).  Patients are able to view lab/test results, encounter notes, upcoming appointments, etc.  Non-urgent messages can be sent to your provider as well.   To learn more about what you can do with MyChart, go to [email or web address].    Your next appointment:   6 month(s)  The format for your next appointment:   In Person  Provider:   Kate Sable, MD    Other Instructions

## 2021-04-24 NOTE — Telephone Encounter (Signed)
PA started through covermymeds  [place] (Key: [id number])  Your information has been submitted to [place]. [employer] [place] will review the request and notify you of the determination decision directly, typically within 3 business days of your submission and once all necessary information is received.  You will also receive your request decision electronically. To check for an update later, open the request again from your dashboard.  If [employer] has not responded within the specified timeframe or if you have any questions about your PA submission, contact [place] directly at [facility]) [phone] or ([place]) [phone].

## 2021-04-28 NOTE — Telephone Encounter (Signed)
Error

## 2021-04-29 ENCOUNTER — Ambulatory Visit: Payer: Medicare Other | Admitting: Primary Care

## 2021-05-05 DIAGNOSIS — L03114 Cellulitis of left upper limb: Secondary | ICD-10-CM | POA: Diagnosis not present

## 2021-05-12 DIAGNOSIS — I152 Hypertension secondary to endocrine disorders: Secondary | ICD-10-CM

## 2021-05-12 DIAGNOSIS — E1159 Type 2 diabetes mellitus with other circulatory complications: Secondary | ICD-10-CM

## 2021-05-13 ENCOUNTER — Ambulatory Visit: Payer: Medicare Other | Attending: Pulmonary Disease

## 2021-05-13 DIAGNOSIS — Z6838 Body mass index (BMI) 38.0-38.9, adult: Secondary | ICD-10-CM | POA: Insufficient documentation

## 2021-05-13 DIAGNOSIS — G4733 Obstructive sleep apnea (adult) (pediatric): Secondary | ICD-10-CM | POA: Diagnosis not present

## 2021-05-13 DIAGNOSIS — R400 Somnolence: Secondary | ICD-10-CM | POA: Diagnosis present

## 2021-05-13 DIAGNOSIS — J449 Chronic obstructive pulmonary disease, unspecified: Secondary | ICD-10-CM | POA: Diagnosis not present

## 2021-05-14 ENCOUNTER — Other Ambulatory Visit: Payer: Self-pay

## 2021-05-18 ENCOUNTER — Telehealth (INDEPENDENT_AMBULATORY_CARE_PROVIDER_SITE_OTHER): Payer: Medicare Other | Admitting: Pulmonary Disease

## 2021-05-18 DIAGNOSIS — G4733 Obstructive sleep apnea (adult) (pediatric): Secondary | ICD-10-CM

## 2021-05-18 NOTE — Telephone Encounter (Signed)
Split night >> AHI 68/h, lowest desat 84% ?BiPAP 18/14 + med FF mask ? ?Can start here or auto BiPAP ?

## 2021-05-18 NOTE — Telephone Encounter (Signed)
Please set up an office or video visit to discuss results and treatment plan ?

## 2021-05-19 ENCOUNTER — Ambulatory Visit (INDEPENDENT_AMBULATORY_CARE_PROVIDER_SITE_OTHER): Payer: Medicare Other

## 2021-05-19 ENCOUNTER — Other Ambulatory Visit: Payer: Self-pay | Admitting: Family Medicine

## 2021-05-19 DIAGNOSIS — Z0000 Encounter for general adult medical examination without abnormal findings: Secondary | ICD-10-CM | POA: Diagnosis not present

## 2021-05-19 NOTE — Telephone Encounter (Signed)
Copied from [place] [id number]. Topic: Quick Communication - Rx Refill/Question ?>> May 19, 2021 10:38 AM Leward Quan A wrote: ?Medication: HYDROcodone-acetaminophen (NORCO/VICODIN) 5-325 MG tablet  ? ?Has the patient contacted their pharmacy? No.  Controlled substance  ?(Agent: If no, request that the patient contact the pharmacy for the refill. If patient does not wish to contact the pharmacy document the reason why and proceed with request.) ?(Agent: If yes, when and what did the pharmacy advise?) ? ?Preferred Pharmacy (with phone number or street name): [id number] - [place], [place]  ?Phone:  [phone] ?Fax:  [phone] ? ? ? ?Has the patient been seen for an appointment in the last year OR does the patient have an upcoming appointment? Yes.   ? ?Agent: Please be advised that RX refills may take up to 3 business days. We ask that you follow-up with your pharmacy. ?

## 2021-05-19 NOTE — Patient Instructions (Signed)
Tony Williams , Thank you for taking time to come for your Medicare Wellness Visit. I appreciate your ongoing commitment to your health goals. Please review the following plan we discussed and let me know if I can assist you in the future.   Screening recommendations/referrals: Colonoscopy: aged out Recommended yearly ophthalmology/optometry visit for glaucoma screening and checkup Recommended yearly dental visit for hygiene and checkup  Vaccinations: Influenza vaccine: 12/10/20 Pneumococcal vaccine: 07/21/20 Tdap vaccine: 05/10/17 Shingles vaccine: n/d   Covid-19: 04/05/19, 05/03/19, 01/10/20  Advanced directives: yes  Conditions/risks identified: none  Next appointment: Follow up in one year for your annual wellness visit. 05/23/21 @ 3pm by phone  Preventive Care 65 Years and Older, Male Preventive care refers to lifestyle choices and visits with your health care provider that can promote health and wellness. What does preventive care include? A yearly physical exam. This is also called an annual well check. Dental exams once or twice a year. Routine eye exams. Ask your health care provider how often you should have your eyes checked. Personal lifestyle choices, including: Daily care of your teeth and gums. Regular physical activity. Eating a healthy diet. Avoiding tobacco and drug use. Limiting alcohol use. Practicing safe sex. Taking low doses of aspirin every day. Taking vitamin and mineral supplements as recommended by your health care provider. What happens during an annual well check? The services and screenings done by your health care provider during your annual well check will depend on your age, overall health, lifestyle risk factors, and family history of disease. Counseling  Your health care provider may ask you questions about your: Alcohol use. Tobacco use. Drug use. Emotional well-being. Home and relationship well-being. Sexual activity. Eating habits. History  of falls. Memory and ability to understand (cognition). Work and work Statistician. Screening  You may have the following tests or measurements: Height, weight, and BMI. Blood pressure. Lipid and cholesterol levels. These may be checked every 5 years, or more frequently if you are over 46 years old. Skin check. Lung cancer screening. You may have this screening every year starting at age 27 if you have a 30-pack-year history of smoking and currently smoke or have quit within the past 15 years. Fecal occult blood test (FOBT) of the stool. You may have this test every year starting at age 37. Flexible sigmoidoscopy or colonoscopy. You may have a sigmoidoscopy every 5 years or a colonoscopy every 10 years starting at age 13. Prostate cancer screening. Recommendations will vary depending on your family history and other risks. Hepatitis C blood test. Hepatitis B blood test. Sexually transmitted disease (STD) testing. Diabetes screening. This is done by checking your blood sugar (glucose) after you have not eaten for a while (fasting). You may have this done every 1-3 years. Abdominal aortic aneurysm (AAA) screening. You may need this if you are a current or former smoker. Osteoporosis. You may be screened starting at age 55 if you are at high risk. Talk with your health care provider about your test results, treatment options, and if necessary, the need for more tests. Vaccines  Your health care provider may recommend certain vaccines, such as: Influenza vaccine. This is recommended every year. Tetanus, diphtheria, and acellular pertussis (Tdap, Td) vaccine. You may need a Td booster every 10 years. Zoster vaccine. You may need this after age 19. Pneumococcal 13-valent conjugate (PCV13) vaccine. One dose is recommended after age 51. Pneumococcal polysaccharide (PPSV23) vaccine. One dose is recommended after age 40. Talk to your health care  provider about which screenings and vaccines you need  and how often you need them. This information is not intended to replace advice given to you by your health care provider. Make sure you discuss any questions you have with your health care provider. Document Released: 03/28/2015 Document Revised: 11/19/2015 Document Reviewed: 12/31/2014 Elsevier Interactive Patient Education  2017 [place] Prevention in the Home Falls can cause injuries. They can happen to people of all ages. There are many things you can do to make your home safe and to help prevent falls. What can I do on the outside of my home? Regularly fix the edges of walkways and driveways and fix any cracks. Remove anything that might make you trip as you walk through a door, such as a raised step or threshold. Trim any bushes or trees on the path to your home. Use bright outdoor lighting. Clear any walking paths of anything that might make someone trip, such as rocks or tools. Regularly check to see if handrails are loose or broken. Make sure that both sides of any steps have handrails. Any raised decks and porches should have guardrails on the edges. Have any leaves, snow, or ice cleared regularly. Use sand or salt on walking paths during winter. Clean up any spills in your garage right away. This includes oil or grease spills. What can I do in the bathroom? Use night lights. Install grab bars by the toilet and in the tub and shower. Do not use towel bars as grab bars. Use non-skid mats or decals in the tub or shower. If you need to sit down in the shower, use a plastic, non-slip stool. Keep the floor dry. Clean up any water that spills on the floor as soon as it happens. Remove soap buildup in the tub or shower regularly. Attach bath mats securely with double-sided non-slip rug tape. Do not have throw rugs and other things on the floor that can make you trip. What can I do in the bedroom? Use night lights. Make sure that you have a light by your bed that is easy  to reach. Do not use any sheets or blankets that are too big for your bed. They should not hang down onto the floor. Have a firm chair that has side arms. You can use this for support while you get dressed. Do not have throw rugs and other things on the floor that can make you trip. What can I do in the kitchen? Clean up any spills right away. Avoid walking on wet floors. Keep items that you use a lot in easy-to-reach places. If you need to reach something above you, use a strong step stool that has a grab bar. Keep electrical cords out of the way. Do not use floor polish or wax that makes floors slippery. If you must use wax, use non-skid floor wax. Do not have throw rugs and other things on the floor that can make you trip. What can I do with my stairs? Do not leave any items on the stairs. Make sure that there are handrails on both sides of the stairs and use them. Fix handrails that are broken or loose. Make sure that handrails are as long as the stairways. Check any carpeting to make sure that it is firmly attached to the stairs. Fix any carpet that is loose or worn. Avoid having throw rugs at the top or bottom of the stairs. If you do have throw rugs, attach them to the  floor with carpet tape. Make sure that you have a light switch at the top of the stairs and the bottom of the stairs. If you do not have them, ask someone to add them for you. What else can I do to help prevent falls? Wear shoes that: Do not have high heels. Have rubber bottoms. Are comfortable and fit you well. Are closed at the toe. Do not wear sandals. If you use a stepladder: Make sure that it is fully opened. Do not climb a closed stepladder. Make sure that both sides of the stepladder are locked into place. Ask someone to hold it for you, if possible. Clearly mark and make sure that you can see: Any grab bars or handrails. First and last steps. Where the edge of each step is. Use tools that help you move  around (mobility aids) if they are needed. These include: Canes. Walkers. Scooters. Crutches. Turn on the lights when you go into a dark area. Replace any light bulbs as soon as they burn out. Set up your furniture so you have a clear path. Avoid moving your furniture around. If any of your floors are uneven, fix them. If there are any pets around you, be aware of where they are. Review your medicines with your doctor. Some medicines can make you feel dizzy. This can increase your chance of falling. Ask your doctor what other things that you can do to help prevent falls. This information is not intended to replace advice given to you by your health care provider. Make sure you discuss any questions you have with your health care provider. Document Released: 12/26/2008 Document Revised: 08/07/2015 Document Reviewed: 04/05/2014 Elsevier Interactive Patient Education  2017 [employer].

## 2021-05-19 NOTE — Progress Notes (Signed)
Virtual Visit via Telephone Note  I connected with  Tony Williams on 05/19/21 at  3:00 PM EST by telephone and verified that I am speaking with the correct person using two identifiers.  Location: Patient: home Provider: [facility] Persons participating in the virtual visit: [place]   I discussed the limitations, risks, security and privacy concerns of performing an evaluation and management service by telephone and the availability of in person appointments. The patient expressed understanding and agreed to proceed.  Interactive audio and video telecommunications were attempted between this nurse and patient, however failed, due to patient having technical difficulties OR patient did not have access to video capability.  We continued and completed visit with audio only.  Some vital signs may be absent or patient reported.   Dionisio David, LPN  Subjective:   Tony Williams is a 76 y.o. male who presents for Medicare Annual/Subsequent preventive examination.  Review of Systems           Objective:    There were no vitals filed for this visit. There is no height or weight on file to calculate BMI.  Advanced Directives 03/11/2021 12/08/2020 11/15/2020 11/11/2020 11/11/2020 11/08/2020 09/23/2020  Does Patient Have a Medical Advance Directive? No No Yes No No Yes Yes  Type of Advance Directive - - Living will Living will - Living will Living will  Does patient want to make changes to medical advance directive? - - No - Patient declined No - Patient declined - No - Patient declined -  Copy of [place] in [place]  Would patient like information on creating a medical advance directive? No - Patient declined No - Patient declined No - Patient declined No - Patient declined No - Patient declined - -    Current Medications (verified) Outpatient Encounter Medications as of 05/19/2021  Medication Sig   albuterol (VENTOLIN HFA) 108 (90 Base)  MCG/ACT inhaler Inhale 2 puffs into the lungs every 6 (six) hours as needed for wheezing or shortness of breath.   allopurinol (ZYLOPRIM) 300 MG tablet Take 1 tablet (300 mg total) by mouth daily.   aspirin EC 81 MG tablet Take 81 mg by mouth daily. Swallow whole.   blood glucose meter kit and supplies Dispense based on patient and insurance preference. Use up to four times daily as directed. (FOR ICD-10 E10.9, E11.9). check blood glucose fasting and before meals four times daily.   Budeson-Glycopyrrol-Formoterol (BREZTRI AEROSPHERE) 160-9-4.8 MCG/ACT AERO Inhale 2 puffs into the lungs 2 (two) times daily.   cephALEXin (KEFLEX) 500 MG capsule Take 500 mg by mouth 2 (two) times daily.   Cholecalciferol (VITAMIN D3 PO) Take 4,000 Units by mouth.   Coenzyme Q10 (CO Q-10) 400 MG CAPS Take by mouth daily at 6 (six) AM.   desoximetasone (TOPICORT) 0.25 % cream Apply 1 application topically 2 (two) times daily.   escitalopram (LEXAPRO) 20 MG tablet Take 1 tablet (20 mg total) by mouth daily.   gabapentin (NEURONTIN) 300 MG capsule Take 1 capsule (300 mg total) by mouth 2 (two) times daily AND 2 capsules (600 mg total) at bedtime.   HYDROcodone-acetaminophen (NORCO/VICODIN) 5-325 MG tablet Take 1 tablet by mouth every 6 (six) hours as needed for moderate pain.   lisinopril (ZESTRIL) 10 MG tablet Take 1 tablet (10 mg total) by mouth daily.   lovastatin (MEVACOR) 40 MG tablet Take 40 mg by mouth at bedtime.   Omega-3 Fatty Acids (FISH  OIL) 1200 MG CAPS Take by mouth daily at 6 (six) AM.   prasugrel (EFFIENT) 10 MG TABS tablet Take 1 tablet (10 mg total) by mouth daily.   torsemide (DEMADEX) 20 MG tablet Take 1 tablet (20 mg total) by mouth daily as needed.   traZODone (DESYREL) 150 MG tablet Take 1 tablet (150 mg total) by mouth at bedtime.   No facility-administered encounter medications on file as of 05/19/2021.    Allergies (verified) Butorphanol, Morphine, Pedi-pre tape spray [wound dressing  adhesive], Simvastatin, and Statins   History: Past Medical History:  Diagnosis Date   CAD S/P percutaneous coronary angioplasty    CAD, multiple vessel    Cancer ([place]) 2013   Bladder   Chronic kidney disease    COPD (chronic obstructive pulmonary disease) ([place])    Depression    Diabetes mellitus without complication ([place])    Heart problem    Hx of CABG x 4    LIMA-D2, SVG-OM (occluded), SeqSVG-D1-LAD.   Myocardial infarction [facility])    Past Surgical History:  Procedure Laterality Date   CHOLECYSTECTOMY, LAPAROSCOPIC  12/26/1995   COLONOSCOPY WITH PROPOFOL N/A 09/23/2020   Procedure: COLONOSCOPY WITH PROPOFOL;  Surgeon: Lucilla Lame, MD;  Location: [facility] ENDOSCOPY;  Service: Endoscopy;  Laterality: N/A;   CORONARY ARTERY BYPASS GRAFT     LIMA-D2, SVG-OM, SeqSVG-D1-LAD   CORONARY STENT INTERVENTION N/A 11/12/2020   Procedure: CORONARY STENT INTERVENTION;  Surgeon: Wellington Hampshire, MD;  Location: [place] CV LAB;  Service: Cardiovascular;  Laterality: N/A;   EYE SURGERY     IABP INSERTION N/A 11/11/2020   Procedure: IABP Insertion;  Surgeon: Nelva Bush, MD;  Location: [place] [facility];  Service: Cardiovascular;  Laterality: N/A;   IR STENT PLACEMENT ANTE CAROTID INC ANGIO     KNEE SURGERY     RIGHT/LEFT HEART CATH AND CORONARY/GRAFT ANGIOGRAPHY N/A 11/11/2020   Procedure: RIGHT/LEFT HEART CATH AND CORONARY/GRAFT ANGIOGRAPHY;  Surgeon: Nelva Bush, MD;  Location: [place] [facility] LAB;  Service: Cardiovascular;  Severe native CAD: 90% dLMCA-100% mLAD/100% ost LCx CTO w/ Severe diffuse RCA disease. Widely patent LIMA-D1. Patent sequential SVG-D2-LAD with 99% mid graft & 90% LIMA-D2 @ anastomosis. previously stented SVG-OM - CTO. Mildly   TRANSURETHRAL RESECTION OF PROSTATE  1994   URINARY SPHINCTER IMPLANT  1995   Family History  Problem Relation Age of Onset   Heart Problems Mother    Heart Problems Father    Social History   Socioeconomic History   Marital  status: Married    Spouse name: Not on file   Number of children: 2   Years of education: Not on file   Highest education level: High school graduate  Occupational History   Occupation: retired  Tobacco Use   Smoking status: Former    Types: Cigarettes    Quit date: 10/23/2003    Years since quitting: 17.5   Smokeless tobacco: Never  Vaping Use   Vaping Use: Never used  Substance and Sexual Activity   Alcohol use: Never   Drug use: Never   Sexual activity: Not on file  Other Topics Concern   Not on file  Social History Narrative   Not on file   Social Determinants of Health   Financial Resource Strain: Low Risk    Difficulty of Paying Living Expenses: Not very hard  Food Insecurity: No Food Insecurity   Worried About Running Out of Food in the Last Year: Never true   [employer]  in the Last Year: Never true  Transportation Needs: No Transportation Needs   Lack of Transportation (Medical): No   Lack of Transportation (Non-Medical): No  Physical Activity: Inactive   Days of Exercise per Week: 0 days   Minutes of Exercise per Session: 0 min  Stress: Stress Concern Present   Feeling of Stress : To some extent  Social Connections: Moderately Integrated   Frequency of Communication with Friends and Family: More than three times a week   Frequency of Social Gatherings with Friends and Family: Three times a week   Attends Religious Services: 1 to 4 times per year   Active Member of Clubs or Organizations: No   Attends Archivist Meetings: Never   Marital Status: Married    Tobacco Counseling Counseling given: Not Answered   Clinical Intake:  Pre-visit preparation completed: Yes  Pain : No/denies pain     Nutritional Risks: None Diabetes: Yes CBG done?: No Did pt. bring in CBG monitor from home?: No  How often do you need to have someone help you when you read instructions, pamphlets, or other written materials from your doctor or pharmacy?: 1 -  Never  Diabetic?yes Nutrition Risk Assessment:  Has the patient had any N/V/D within the last 2 months?  No  Does the patient have any non-healing wounds?  No  Has the patient had any unintentional weight loss or weight gain?  No   Diabetes:  Is the patient diabetic?  Yes  If diabetic, was a CBG obtained today?  No  Did the patient bring in their glucometer from home?  No  How often do you monitor your CBG's? never.   Financial Strains and Diabetes Management:  Are you having any financial strains with the device, your supplies or your medication? No .  Does the patient want to be seen by Chronic Care Management for management of their diabetes?  No  Would the patient like to be referred to a Nutritionist or for Diabetic Management?  No   Diabetic Exams:  Diabetic Eye Exam: Completed no. Overdue for diabetic eye exam. Pt has been advised about the importance in completing this exam.   Diabetic Foot Exam: Completed 07/21/20. Pt has been advised about the importance in completing this exam.  Interpreter Needed?: No  Information entered by :: Kirke Shaggy, LPN   Activities of Daily Living In your present state of health, do you have any difficulty performing the following activities: 04/16/2021 12/08/2020  Hearing? N N  Vision? N N  Difficulty concentrating or making decisions? N N  Walking or climbing stairs? Y Y  Comment - climbing stairs  Dressing or bathing? N N  Doing errands, shopping? N N  Some recent data might be hidden    Patient Care Team: Virginia Crews, MD as PCP - General (Family Medicine) Kate Sable, MD as PCP - Cardiology (Cardiology) Kate Sable, MD as Consulting Physician (Cardiology) [place], [place], [place] as Social Worker  Indicate any recent Medical Services you may have received from other than [facility] providers in the past year (date may be approximate).     Assessment:   This is a routine wellness examination for  Tony Williams.  Hearing/Vision screen No results found.  Dietary issues and exercise activities discussed:     Goals Addressed   None    Depression Screen PHQ 2/9 Scores 04/16/2021 02/26/2021 01/29/2021 10/21/2020 05/13/2020 04/18/2020  PHQ - 2 Score 1 2 4  0 0 -  PHQ- 9 Score  5 9 13 2  - -  Exception Documentation - - - - - Patient refusal    Fall Risk Fall Risk  11/28/2020 05/13/2020  Falls in the past year? 1 0  Number falls in past yr: 0 0  Injury with Fall? - 0  Risk for fall due to : Impaired balance/gait -  Follow up Education provided;Falls prevention discussed;Follow up appointment -    FALL RISK PREVENTION PERTAINING TO THE HOME:  Any stairs in or around the home? Yes  If so, are there any without handrails? No  Home free of loose throw rugs in walkways, pet beds, electrical cords, etc? Yes  Adequate lighting in your home to reduce risk of falls? Yes   ASSISTIVE DEVICES UTILIZED TO PREVENT FALLS:  Life alert? No  Use of a cane, walker or w/c? Yes  Grab bars in the bathroom? No  Shower chair or bench in shower? Yes  Elevated toilet seat or a handicapped toilet? No   Cognitive Function:Normal cognitive status assessed by direct observation by this Nurse Health Advisor. No abnormalities found.          Immunizations Immunization History  Administered Date(s) Administered   Fluad Quad(high Dose 65+) 12/10/2020   Influenza, High Dose Seasonal PF 01/13/2020   Moderna Sars-Covid-2 Vaccination 04/05/2019, 05/03/2019, 01/10/2020   Pneumococcal Conjugate-13 10/10/2014   Pneumococcal Polysaccharide-23 12/29/2018, 07/21/2020   Td 05/10/2017    TDAP status: Up to date  Flu Vaccine status: Up to date  Pneumococcal vaccine status: Up to date  Covid-19 vaccine status: Completed vaccines  Qualifies for Shingles Vaccine? Yes   Zostavax completed No   Shingrix Completed?: No.    Education has been provided regarding the importance of this vaccine. Patient has been advised to  call insurance company to determine out of pocket expense if they have not yet received this vaccine. Advised may also receive vaccine at local pharmacy or Health Dept. Verbalized acceptance and understanding.  Screening Tests Health Maintenance  Topic Date Due   OPHTHALMOLOGY EXAM  Never done   Zoster Vaccines- Shingrix (1 of 2) Never done   COVID-19 Vaccine (4 - Booster for Moderna series) 03/06/2020   HEMOGLOBIN A1C  06/29/2021   FOOT EXAM  07/21/2021   TETANUS/TDAP  05/11/2027   COLONOSCOPY (Pts 45-49yr Insurance coverage will need to be confirmed)  09/24/2030   Pneumonia Vaccine 75 Years old  Completed   INFLUENZA VACCINE  Completed   Hepatitis C Screening  Completed   HPV VACCINES  Aged Out    Health Maintenance  Health Maintenance Due  Topic Date Due   OPHTHALMOLOGY EXAM  Never done   Zoster Vaccines- Shingrix (1 of 2) Never done   COVID-19 Vaccine (4 - Booster for Moderna series) 03/06/2020    Colorectal cancer screening: Type of screening: Colonoscopy. Completed 09/23/20. Repeat every 10 years  Lung Cancer Screening: (Low Dose CT Chest recommended if Age 75-80years, 30 pack-year currently smoking OR have quit w/in 15years.) does not qualify.    Additional Screening:  Hepatitis C Screening: does qualify; Completed 08/10/17  Vision Screening: Recommended annual ophthalmology exams for early detection of glaucoma and other disorders of the eye. Is the patient up to date with their annual eye exam?  Yes  Who is the provider or what is the name of the office in which the patient attends annual eye exams? [facility] to [facility] appointment If pt is not established with a provider, would they like to be referred  to a provider to establish care? No .   Dental Screening: Recommended annual dental exams for proper oral hygiene  Community Resource Referral / Chronic Care Management: CRR required this visit?  No   CCM required this visit?  No      Plan:      I have personally reviewed and noted the following in the patients chart:   Medical and social history Use of alcohol, tobacco or illicit drugs  Current medications and supplements including opioid prescriptions. Patient is currently taking opioid prescriptions. Information provided to patient regarding non-opioid alternatives. Patient advised to discuss non-opioid treatment plan with their provider. Functional ability and status Nutritional status Physical activity Advanced directives List of other physicians Hospitalizations, surgeries, and ER visits in previous 12 months Vitals Screenings to include cognitive, depression, and falls Referrals and appointments  In addition, I have reviewed and discussed with patient certain preventive protocols, quality metrics, and best practice recommendations. A written personalized care plan for preventive services as well as general preventive health recommendations were provided to patient.     Dionisio David, LPN   05/14/1214   Nurse Notes: none

## 2021-05-20 NOTE — Telephone Encounter (Signed)
Requested medication (s) are due for refill today: Yes ? ?Requested medication (s) are on the active medication list: Yes ? ?Last refill:  04/16/21 ? ?Future visit scheduled: Yes ? ?Notes to clinic:  See request. ? ? ? ?Requested Prescriptions  ?Pending Prescriptions Disp Refills  ? HYDROcodone-acetaminophen (NORCO/VICODIN) 5-325 MG tablet 30 tablet 0  ?  Sig: Take 1 tablet by mouth every 6 (six) hours as needed for moderate pain.  ?  ? Not Delegated - Analgesics:  Opioid Agonist Combinations Failed - 05/19/2021 10:43 AM  ?  ?  Failed - This refill cannot be delegated  ?  ?  Failed - Urine Drug Screen completed in last 360 days  ?  ?  Passed - Valid encounter within last 3 months  ?  Recent Outpatient Visits   ? ?      ? 1 month ago GAD (generalized anxiety disorder)  ? [place], MD  ? 1 month ago Otalgia, unspecified laterality  ? [place], PA-C  ? 1 month ago COVID-19  ? [place], [place], DO  ? 1 month ago COVID-19  ? [facility] [place], Dionne Bucy, MD  ? 3 months ago GAD (generalized anxiety disorder)  ? [facility] Tally Joe T, FNP  ? ?  ?  ?Future Appointments   ? ?        ? In 1 week Parrett, Fonnie Mu, NP [facility]  ? In 2 months Bacigalupo, Dionne Bucy, MD [facility], [facility]  ? In 5 months Agbor-Etang, Aaron Edelman, MD [facility], [facility]  ? ?  ? ?  ?  ?  ? ?

## 2021-05-21 DIAGNOSIS — G4733 Obstructive sleep apnea (adult) (pediatric): Secondary | ICD-10-CM

## 2021-05-21 MED ORDER — HYDROCODONE-ACETAMINOPHEN 5-325 MG PO TABS: 1.0000 | ORAL_TABLET | Freq: Four times a day (QID) | ORAL | 0 refills | Status: DC | PRN

## 2021-05-21 NOTE — Telephone Encounter (Signed)
Scheduled to see Tammy Parrett NP on 05/29/21 at 2:30 pm.  Nothing further needed. ?

## 2021-05-29 ENCOUNTER — Ambulatory Visit: Payer: Medicare Other | Admitting: Adult Health

## 2021-05-29 ENCOUNTER — Other Ambulatory Visit: Payer: Self-pay

## 2021-05-29 ENCOUNTER — Encounter: Payer: Self-pay | Admitting: Adult Health

## 2021-05-29 VITALS — BP 146/84 | HR 78 | Temp 97.1°F | Ht 69.0 in | Wt 264.8 lb

## 2021-05-29 DIAGNOSIS — J449 Chronic obstructive pulmonary disease, unspecified: Secondary | ICD-10-CM | POA: Diagnosis not present

## 2021-05-29 DIAGNOSIS — G4733 Obstructive sleep apnea (adult) (pediatric): Secondary | ICD-10-CM

## 2021-05-29 NOTE — Assessment & Plan Note (Signed)
COPD with restrictive changes on PFTs.  Patient is continue on Breztri.  Suspect cough may be multifactorial with ACE inhibitor, postnasal drip and GERD.  We will add in Delsym.  Pepcid at bedtime and saline nasal rinses for postnasal drip. ?Would change ACE inhibitor if possible ? ? ?Plan  ?Patient Instructions  ?Please take BREZTRI 2 puffs Twice daily  , rinse after use  ?Albuterol inhaler As needed   ?Discuss with Cardiology that Lisinopril may make your cough worse.  ?Delsym 2 tsp Twice daily  for cough As needed   ?Begin Pepcid '20mg'$  At bedtime   ?Saline nasal spray Twice daily .  ? ?Begin BIPAP At bedtime   ?Wear all night long .  ?Work on healthy weight loss ?Do not drive if sleepy .  ?Follow up with Dr. Halford Chessman  or Darlen Gledhill NP in 3 months  and As needed   ?Please contact office for sooner follow up if symptoms do not improve or worsen or seek emergency care  ? ? ? ?  ? ?

## 2021-05-29 NOTE — Assessment & Plan Note (Signed)
Severe obstructive sleep apnea-patient underwent split-night study showing that patient requires BiPAP for control of sleep apnea ?We will begin BiPAP AutoSet IPAP max at 18 and EPAP minimum at 10 cm H2O. ?Patient education on BiPAP  ? ?- discussed how weight can impact sleep and risk for sleep disordered breathing ?- discussed options to assist with weight loss: combination of diet modification, cardiovascular and strength training exercises ?  ?- had an extensive discussion regarding the adverse health consequences related to untreated sleep disordered breathing ?- specifically discussed the risks for hypertension, coronary artery disease, cardiac dysrhythmias, cerebrovascular disease, and diabetes ?- lifestyle modification discussed ?  ?- discussed how sleep disruption can increase risk of accidents, particularly when driving ?- safe driving practices were discussed ?  ? ? ?Plan  ?Patient Instructions  ?Please take BREZTRI 2 puffs Twice daily  , rinse after use  ?Albuterol inhaler As needed   ?Discuss with Cardiology that Lisinopril may make your cough worse.  ?Delsym 2 tsp Twice daily  for cough As needed   ?Begin Pepcid '20mg'$  At bedtime   ?Saline nasal spray Twice daily .  ? ?Begin BIPAP At bedtime   ?Wear all night long .  ?Work on healthy weight loss ?Do not drive if sleepy .  ?Follow up with Dr. Halford Chessman  or Wataru Mccowen NP in 3 months  and As needed   ?Please contact office for sooner follow up if symptoms do not improve or worsen or seek emergency care  ? ? ? ?  ? ?

## 2021-05-29 NOTE — Patient Instructions (Addendum)
Please take BREZTRI 2 puffs Twice daily  , rinse after use  ?Albuterol inhaler As needed   ?Discuss with Cardiology that Lisinopril may make your cough worse.  ?Delsym 2 tsp Twice daily  for cough As needed   ?Begin Pepcid '20mg'$  At bedtime   ?Saline nasal spray Twice daily .  ? ?Begin BIPAP At bedtime   ?Wear all night long .  ?Work on healthy weight loss ?Do not drive if sleepy .  ?Follow up with Dr. Halford Chessman  or Jnya Brossard NP in 3 months  and As needed   ?Please contact office for sooner follow up if symptoms do not improve or worsen or seek emergency care  ? ? ? ?

## 2021-05-29 NOTE — Progress Notes (Signed)
? ?@Patient  ID: Tony Williams, male    DOB: 01/23/1946, 76 y.o.   MRN: [id number] ? ?Chief Complaint  ?Patient presents with  ? Follow-up  ? ? ?Referring provider: ?Virginia Crews, MD ? ?HPI: ?76 year old male former smoker seen for pulmonary consult January 21, 2021 for snoring and daytime sleepiness along with shortness of breath ?Medical history significant for coronary artery disease status post CABG, right pleural effusion with fibrothorax and trapped lung, COPD, diabetes, congestive heart failure, hypertension and chronic kidney disease ? ?TEST/EVENTS :  ?CT angio chest 11/08/20 >> fibrothorax on Rt with volume loss, negative for PE ?Echo 11/16/20 >> EF 60 to 65% ? ?Split night >> AHI 68/h, lowest desat 84% ?BiPAP 18/14 + med FF mask ? ?PFT showed moderate restriction last month. With FEV1 at 51%, ratio 75, FVC 49%, no BD response., DLCO at 54% .  ?  ? ?05/29/2021 Follow up : OSA and COPD  ?Patient returns for a follow-up visit.  Patient was seen last visit with ongoing complaints of snoring daytime sleepiness and fatigue. He was set up for a split-night sleep study which was completed on May 18, 2021.  This showed severe sleep apnea with AHI at 68/hour and SPO2 low at 84%.  Patient was not optimally controlled on CPAP and was transitioned over to BiPAP requiring 18/14.  We discussed his sleep study results went over treatment options including weight loss.  Discussed that patient will need BiPAP to control his underlying severe sleep apnea.  Patient is in agreement.  He has ongoing snoring, daytime sleepiness and fatigue. ?Says he wore the BIPAP during study okay , says he was able to tolerate okay . Wore a full face mask. He wore a med FF mask .  ? ?Patient has COPD with restrictive changes in PFT . Last visit recommended to continue on BREZTRI . He was able to get approval for patient assistance to help cover cost. Says does not take every day . We discussed importance of compliance. Complains of  cough everyday seems to be worse at night.  Does have a little bit of reflux and sinus drainage.  Patient is also add an ACE inhibitor.  Says cough is very aggravating. ?Patient denies any fever, discolored mucus, chest pain, orthopnea. ? ?Allergies  ?Allergen Reactions  ? Butorphanol Itching  ? Morphine Other (See Comments)  ?  Redness around IV site  ? Pedi-Pre Tape Spray [Wound Dressing Adhesive] Itching  ? Simvastatin Other (See Comments)  ?  "Destroyed my muscles"  ? Statins Other (See Comments)  ?  "Destroyed my muscles"  ? ? ?Immunization History  ?Administered Date(s) Administered  ? Fluad Quad(high Dose 65+) 12/10/2020  ? Influenza, High Dose Seasonal PF 01/13/2020  ? Moderna Sars-Covid-2 Vaccination 04/05/2019, 05/03/2019, 01/10/2020  ? Pneumococcal Conjugate-13 10/10/2014  ? Pneumococcal Polysaccharide-23 12/29/2018, 07/21/2020  ? Td 05/10/2017  ? ? ?Past Medical History:  ?Diagnosis Date  ? CAD S/P percutaneous coronary angioplasty   ? CAD, multiple vessel   ? Cancer [facility]) 2013  ? Bladder  ? Chronic kidney disease   ? COPD (chronic obstructive pulmonary disease) ([place])   ? Depression   ? Diabetes mellitus without complication ([facility])   ? Heart problem   ? Hx of CABG x 4   ? LIMA-D2, SVG-OM (occluded), SeqSVG-D1-LAD.  ? Myocardial infarction [facility])   ? ? ?Tobacco History: ?Social History  ? ?Tobacco Use  ?Smoking Status Former  ? Types: Cigarettes  ? Quit date: 10/23/2003  ?  Years since quitting: 17.6  ?Smokeless Tobacco Never  ? ?Counseling given: Not Answered ? ? ?Outpatient Medications Prior to Visit  ?Medication Sig Dispense Refill  ? albuterol (VENTOLIN HFA) 108 (90 Base) MCG/ACT inhaler Inhale 2 puffs into the lungs every 6 (six) hours as needed for wheezing or shortness of breath. 8 g 2  ? allopurinol (ZYLOPRIM) 300 MG tablet Take 1 tablet (300 mg total) by mouth daily. 90 tablet 3  ? aspirin EC 81 MG tablet Take 81 mg by mouth daily. Swallow whole.    ? blood glucose meter kit and supplies Dispense  based on patient and insurance preference. Use up to four times daily as directed. (FOR ICD-10 E10.9, E11.9). check blood glucose fasting and before meals four times daily. 1 each 0  ? Budeson-Glycopyrrol-Formoterol (BREZTRI AEROSPHERE) 160-9-4.8 MCG/ACT AERO Inhale 2 puffs into the lungs 2 (two) times daily. 10.7 g 0  ? cephALEXin (KEFLEX) 500 MG capsule Take 500 mg by mouth 2 (two) times daily.    ? Cholecalciferol (VITAMIN D3 PO) Take 4,000 Units by mouth.    ? Coenzyme Q10 (CO Q-10) 400 MG CAPS Take by mouth daily at 6 (six) AM.    ? desoximetasone (TOPICORT) 0.25 % cream Apply 1 application topically 2 (two) times daily.    ? escitalopram (LEXAPRO) 20 MG tablet Take 1 tablet (20 mg total) by mouth daily. 90 tablet 1  ? gabapentin (NEURONTIN) 300 MG capsule Take 1 capsule (300 mg total) by mouth 2 (two) times daily AND 2 capsules (600 mg total) at bedtime. 360 capsule 1  ? HYDROcodone-acetaminophen (NORCO/VICODIN) 5-325 MG tablet Take 1 tablet by mouth every 6 (six) hours as needed for moderate pain. 30 tablet 0  ? lisinopril (ZESTRIL) 10 MG tablet Take 1 tablet (10 mg total) by mouth daily. 90 tablet 1  ? lovastatin (MEVACOR) 40 MG tablet Take 40 mg by mouth at bedtime.    ? Omega-3 Fatty Acids (FISH OIL) 1200 MG CAPS Take by mouth daily at 6 (six) AM.    ? prasugrel (EFFIENT) 10 MG TABS tablet Take 1 tablet (10 mg total) by mouth daily. 30 tablet 5  ? torsemide (DEMADEX) 20 MG tablet Take 1 tablet (20 mg total) by mouth daily as needed. 30 tablet 3  ? traZODone (DESYREL) 150 MG tablet Take 1 tablet (150 mg total) by mouth at bedtime. 90 tablet 1  ? ?No facility-administered medications prior to visit.  ? ? ? ?Review of Systems:  ? ?Constitutional:   No  weight loss, night sweats,  Fevers, chills,  ?+fatigue, or  lassitude. ? ?HEENT:   No headaches,  Difficulty swallowing,  Tooth/dental problems, or  Sore throat,  ?              No sneezing, itching, ear ache,  ?+nasal congestion, post nasal drip,  ? ?CV:  No  chest pain,  Orthopnea, PND, swelling in lower extremities, anasarca, dizziness, palpitations, syncope.  ? ?GI  No heartburn, indigestion, abdominal pain, nausea, vomiting, diarrhea, change in bowel habits, loss of appetite, bloody stools.  ? ?Resp: .  No chest wall deformity ? ?Skin: no rash or lesions. ? ?GU: no dysuria, change in color of urine, no urgency or frequency.  No flank pain, no hematuria  ? ?MS:  No joint pain or swelling.  No decreased range of motion.  No back pain. ? ? ? ?Physical Exam ? ?BP (!) 146/84 (BP Location: Left Arm, Patient Position: Sitting, Cuff Size: Normal)  Pulse 78   Temp (!) 97.1 ?F (36.2 ?C) (Oral)   Ht 5' 9"  (1.753 m)   Wt 264 lb 12.8 oz (120.1 kg)   SpO2 92%   BMI 39.10 kg/m?  ? ?GEN: A/Ox3; pleasant , NAD ?  ?HEENT:  [place]/AT,   NOSE-clear, THROAT-clear, no lesions, no postnasal drip or exudate noted.  ?Class 3 MP airway  ? ?NECK:  Supple w/ fair ROM; no JVD; normal carotid impulses w/o bruits; no thyromegaly or nodules palpated; no lymphadenopathy.   ? ?RESP  Clear  P & A; w/o, wheezes/ rales/ or rhonchi. no accessory muscle use, no dullness to percussion ? ?CARD:  RRR, no m/r/g, no peripheral edema, pulses intact, no cyanosis or clubbing. ? ?GI:   Soft & nt; nml bowel sounds; no organomegaly or masses detected.  ? ?Musco: Warm bil, no deformities or joint swelling noted.  ? ?Neuro: alert, no focal deficits noted.   ? ?Skin: Warm, no lesions or rashes ? ? ? ?Lab Results: ? ? ? ?BMET ? ? ? ? ?ProBNP ?No results found for: PROBNP ? ?Imaging: ?SLEEP STUDY DOCUMENTS ? ?Result Date: 05/18/2021 ?Ordered by an unspecified provider.  ? ? ? ?PFT Results Latest Ref Rng & Units 01/22/2021  ?FVC-Pre L 2.05  ?FVC-Predicted Pre % 49  ?FVC-Post L 1.74  ?FVC-Predicted Post % 42  ?Pre FEV1/FVC % % 75  ?Post FEV1/FCV % % 83  ?FEV1-Pre L 1.54  ?FEV1-Predicted Pre % 51  ?FEV1-Post L 1.44  ?DLCO uncorrected ml/min/mmHg 13.43  ?DLCO UNC% % 54  ?DLVA Predicted % 103  ?TLC L 3.43  ?TLC % Predicted %  50  ?RV % Predicted % 72  ? ? ?No results found for: NITRICOXIDE ? ? ? ? ? ?Assessment & Plan:  ? ?OSA (obstructive sleep apnea) ?Severe obstructive sleep apnea-patient underwent split-night study showing that pat

## 2021-06-01 NOTE — Progress Notes (Signed)
Reviewed and agree with assessment/plan. ? ? ?Harris Kistler, MD ?[facility] ?06/01/2021, 8:31 AM ?Pager:  [phone] ? ?

## 2021-06-16 ENCOUNTER — Telehealth: Payer: Self-pay | Admitting: Adult Health

## 2021-06-16 DIAGNOSIS — G4733 Obstructive sleep apnea (adult) (pediatric): Secondary | ICD-10-CM | POA: Diagnosis not present

## 2021-06-22 ENCOUNTER — Other Ambulatory Visit: Payer: Self-pay | Admitting: Family Medicine

## 2021-06-22 NOTE — Telephone Encounter (Signed)
Copied from [place]. Topic: Quick Communication - Rx Refill/Question ?>> Jun 22, 2021 10:20 AM Tessa Lerner A wrote: ?Medication: HYDROcodone-acetaminophen (NORCO/VICODIN) 5-325 MG tablet [[id number]]  ? ?Has the patient contacted their pharmacy? No. ?(Agent: If no, request that the patient contact the pharmacy for the refill. If patient does not wish to contact the pharmacy document the reason why and proceed with request.) ?(Agent: If yes, when and what did the pharmacy advise?) ? ?Preferred Pharmacy (with phone number or street name): [id number] - [place], [place] - [place] ?[address] [place] [address] ?Phone: [phone] Fax: [phone] ?Hours: Not open 24 hours ? ? ?Has the patient been seen for an appointment in the last year OR does the patient have an upcoming appointment? Yes.   ? ?Agent: Please be advised that RX refills may take up to 3 business days. We ask that you follow-up with your pharmacy. ?

## 2021-06-23 MED ORDER — HYDROCODONE-ACETAMINOPHEN 5-325 MG PO TABS: 1.0000 | ORAL_TABLET | Freq: Four times a day (QID) | ORAL | 0 refills | Status: DC | PRN

## 2021-06-23 NOTE — Telephone Encounter (Signed)
Order was placed 3/17 for the bipap settings. Closing encounter. ?

## 2021-06-23 NOTE — Telephone Encounter (Signed)
Requested medication (s) are due for refill today: yes  ? ?Requested medication (s) are on the active medication list: yes ? ?Last refill:  05/19/21 ? ?Future visit scheduled: yes ? ?Notes to clinic: Unable to refill per protocol, cannot delegate.  ? ? ?  ?Requested Prescriptions  ?Pending Prescriptions Disp Refills  ? HYDROcodone-acetaminophen (NORCO/VICODIN) 5-325 MG tablet 30 tablet 0  ?  Sig: Take 1 tablet by mouth every 6 (six) hours as needed for moderate pain.  ?  ? Not Delegated - Analgesics:  Opioid Agonist Combinations Failed - 06/22/2021 12:01 PM  ?  ?  Failed - This refill cannot be delegated  ?  ?  Failed - Urine Drug Screen completed in last 360 days  ?  ?  Passed - Valid encounter within last 3 months  ?  Recent Outpatient Visits   ? ?      ? 2 months ago GAD (generalized anxiety disorder)  ? [facility] [place], Dionne Bucy, MD  ? 2 months ago Otalgia, unspecified laterality  ? [place], PA-C  ? 2 months ago COVID-19  ? [place], DO  ? 2 months ago COVID-19  ? [facility] [place], Dionne Bucy, MD  ? 4 months ago GAD (generalized anxiety disorder)  ? [facility] Tally Joe T, FNP  ? ?  ?  ?Future Appointments   ? ?        ? In 4 months Agbor-Etang, Aaron Edelman, MD [facility]  ? ?  ? ?  ?  ?  ? ? ?

## 2021-07-03 DIAGNOSIS — G4733 Obstructive sleep apnea (adult) (pediatric): Secondary | ICD-10-CM | POA: Diagnosis not present

## 2021-07-06 ENCOUNTER — Other Ambulatory Visit: Payer: Self-pay | Admitting: Family Medicine

## 2021-07-08 NOTE — Telephone Encounter (Signed)
Requested medication (s) are due for refill today: Yes ? ?Requested medication (s) are on the active medication list: Yes ? ?Last refill:  11/14/20 ? ?Future visit scheduled: Yes ? ?Notes to clinic:  Historical provider. ? ? ? ?Requested Prescriptions  ?Pending Prescriptions Disp Refills  ? lovastatin (MEVACOR) 40 MG tablet [Pharmacy Med Name: LOVASTATIN '40MG'$  TABLETS] 90 tablet   ?  Sig: TAKE 1 TABLET(40 MG) BY MOUTH DAILY  ?  ? Cardiovascular:  Antilipid - Statins 2 Failed - 07/06/2021  6:32 PM  ?  ?  Failed - Cr in normal range and within 360 days  ?  Creatinine, Ser  ?Date Value Ref Range Status  ?03/11/2021 1.70 (H) 0.61 - 1.24 mg/dL Final  ?  ?  ?  ?  Failed - Lipid Panel in normal range within the last 12 months  ?  Cholesterol, Total  ?Date Value Ref Range Status  ?07/21/2020 156 100 - 199 mg/dL Final  ? ?Cholesterol  ?Date Value Ref Range Status  ?11/09/2020 123 0 - 200 mg/dL Final  ? ?LDL Chol Calc (NIH)  ?Date Value Ref Range Status  ?07/21/2020 84 0 - 99 mg/dL Final  ? ?LDL Cholesterol  ?Date Value Ref Range Status  ?11/09/2020 47 0 - 99 mg/dL Final  ?  Comment:  ?         ?Total Cholesterol/HDL:CHD Risk ?Coronary Heart Disease Risk Table ?                    Men   Women ? 1/2 Average Risk   3.4   3.3 ? Average Risk       5.0   4.4 ? 2 X Average Risk   9.6   7.1 ? 3 X Average Risk  23.4   11.0 ?       ?Use the calculated Patient Ratio ?above and the CHD Risk Table ?to determine the patient's CHD Risk. ?       ?ATP III CLASSIFICATION (LDL): ? <100     mg/dL   Optimal ? 100-129  mg/dL   Near or Above ?                   Optimal ? 130-159  mg/dL   Borderline ? 160-189  mg/dL   High ? >190     mg/dL   Very High ?Performed at [facility], [address], [place], [place] [address] ?  ? ?HDL  ?Date Value Ref Range Status  ?11/09/2020 32 (L) >40 mg/dL Final  ?07/21/2020 34 (L) >39 mg/dL Final  ? ?Triglycerides  ?Date Value Ref Range Status  ?11/09/2020 219 (H) <150 mg/dL Final  ? ?  ?  ?  Passed -  Patient is not pregnant  ?  ?  Passed - Valid encounter within last 12 months  ?  Recent Outpatient Visits   ? ?      ? 2 months ago GAD (generalized anxiety disorder)  ? [facility], Dionne Bucy, MD  ? 2 months ago Otalgia, unspecified laterality  ? [place], PA-C  ? 3 months ago COVID-19  ? [place], DO  ? 3 months ago COVID-19  ? [facility] [place], Dionne Bucy, MD  ? 4 months ago GAD (generalized anxiety disorder)  ? [facility] Tally Joe T, FNP  ? ?  ?  ?Future Appointments   ? ?        ?  In 3 months Agbor-Etang, Aaron Edelman, MD [facility] [place]  ? ?  ? ? ?  ?  ?  ? ?

## 2021-07-16 ENCOUNTER — Other Ambulatory Visit: Payer: Self-pay | Admitting: Physician Assistant

## 2021-07-16 ENCOUNTER — Other Ambulatory Visit: Payer: Self-pay

## 2021-07-16 DIAGNOSIS — G4733 Obstructive sleep apnea (adult) (pediatric): Secondary | ICD-10-CM | POA: Diagnosis not present

## 2021-07-16 DIAGNOSIS — F411 Generalized anxiety disorder: Secondary | ICD-10-CM

## 2021-07-16 MED ORDER — TORSEMIDE 20 MG PO TABS: 20.0000 mg | ORAL_TABLET | Freq: Every day | ORAL | 1 refills | Status: DC | PRN

## 2021-07-20 ENCOUNTER — Other Ambulatory Visit: Payer: Self-pay | Admitting: Family Medicine

## 2021-07-21 ENCOUNTER — Other Ambulatory Visit: Payer: Self-pay

## 2021-07-21 DIAGNOSIS — E7800 Pure hypercholesterolemia, unspecified: Secondary | ICD-10-CM

## 2021-07-22 ENCOUNTER — Telehealth: Payer: Self-pay | Admitting: Pulmonary Disease

## 2021-07-22 ENCOUNTER — Other Ambulatory Visit
Admission: RE | Admit: 2021-07-22 | Discharge: 2021-07-22 | Disposition: A | Discharge status: Home / Self Care | Payer: Medicare Other | Attending: Cardiology | Admitting: Cardiology

## 2021-07-22 DIAGNOSIS — E7800 Pure hypercholesterolemia, unspecified: Secondary | ICD-10-CM | POA: Diagnosis not present

## 2021-07-22 LAB — LIPID PANEL
Cholesterol: 200 mg/dL (ref 0–200)
HDL: 33 mg/dL — ABNORMAL LOW (ref >40)
LDL Cholesterol: 90 mg/dL (ref 0–99)
Total CHOL/HDL Ratio: 6.1 RATIO
Triglycerides: 384 mg/dL — ABNORMAL HIGH (ref <150)
VLDL: 77 mg/dL — ABNORMAL HIGH (ref 0–40)

## 2021-07-22 NOTE — Telephone Encounter (Signed)
Spoke to patient and scheduled OV 09/03/21 at 8:45. ?Patient is aware and voiced his understanding.  ?Nothing further needed.  ? ?

## 2021-07-22 NOTE — Telephone Encounter (Signed)
Patient states does not have a mychart account. Would like to be seen in [place]. Does not want to come to [place]. Patient phone number is [phone]. ?

## 2021-07-22 NOTE — Telephone Encounter (Signed)
Okay to schedule as virtual video visit. ?

## 2021-07-22 NOTE — Telephone Encounter (Signed)
Lm for patient.  ?Please schedule virtual visit.  ?

## 2021-07-22 NOTE — Telephone Encounter (Signed)
Okay to double book visit on 09/03/21 or do telephone visit.  Which ever he feels more comfortable with. ?

## 2021-07-22 NOTE — Telephone Encounter (Signed)
Dr. Halford Chessman, please advise if okay to schedule virtual visit for cpap compliance appt. ?No availability in [place] prior to 09/15/2021 ?

## 2021-07-22 NOTE — Telephone Encounter (Signed)
Pt states he received CPAP and needed to make f/u so that insurance would continue to pay for it. States he needs to be seen prior to July 4th. Nothing available in [place] office with VS or APPs. Offered to make appt in [place], but pt refused. Is there any double booking we could do to help pt? Please advise.  ?

## 2021-07-22 NOTE — Telephone Encounter (Signed)
Spoke to patient. He is requesting telephone visit prior to 09/13/2020. He does not have mychart and he is not familiar with virtual visits. I have offered sooner appt in [place] and pt declined. ? ?Dr. Halford Chessman, please advise if okay to double book on 6/22/023 or do a phone visit. Thanks  ?

## 2021-07-23 ENCOUNTER — Other Ambulatory Visit: Payer: Self-pay

## 2021-07-23 MED ORDER — HYDROCODONE-ACETAMINOPHEN 5-325 MG PO TABS: 1.0000 | ORAL_TABLET | Freq: Four times a day (QID) | ORAL | 0 refills | Status: DC | PRN

## 2021-07-23 NOTE — Telephone Encounter (Signed)
LOV:04/16/2021 ?NOV: 08/06/2021 ?

## 2021-07-23 NOTE — Telephone Encounter (Signed)
Requested medication (s) are due for refill today: yes ? ?Requested medication (s) are on the active medication list: yes ? ?Last refill:  06/23/21 #30 ? ?Future visit scheduled: yes ? ?Notes to clinic:  med not delegated to NT to RF ? ? ?Requested Prescriptions  ?Pending Prescriptions Disp Refills  ? HYDROcodone-acetaminophen (NORCO/VICODIN) 5-325 MG tablet 30 tablet 0  ?  Sig: Take 1 tablet by mouth every 6 (six) hours as needed for moderate pain.  ?  ? Not Delegated - Analgesics:  Opioid Agonist Combinations Failed - 07/23/2021  9:07 AM  ?  ?  Failed - This refill cannot be delegated  ?  ?  Failed - Urine Drug Screen completed in last 360 days  ?  ?  Passed - Valid encounter within last 3 months  ?  Recent Outpatient Visits   ? ?      ? 3 months ago GAD (generalized anxiety disorder)  ? [facility] [place], Dionne Bucy, MD  ? 3 months ago Otalgia, unspecified laterality  ? [place], PA-C  ? 3 months ago COVID-19  ? [place], DO  ? 3 months ago COVID-19  ? [facility] [place], Dionne Bucy, MD  ? 5 months ago GAD (generalized anxiety disorder)  ? [facility] Tally Joe T, FNP  ? ?  ?  ?Future Appointments   ? ?        ? In 1 month Chesley Mires, MD [facility] [place]  ? In 3 months Agbor-Etang, Aaron Edelman, MD [facility]  ? ?  ? ? ?  ?  ?  ? ? ? ? ?

## 2021-07-23 NOTE — Telephone Encounter (Signed)
Patient called and stated he needed a refill for ? ?Requested Prescriptions  ? ?Pending Prescriptions Disp Refills  ? HYDROcodone-acetaminophen (NORCO/VICODIN) 5-325 MG tablet 30 tablet 0  ?  Sig: Take 1 tablet by mouth every 6 (six) hours as needed for moderate pain.  ? ?Sent to [employer]. ?

## 2021-07-24 ENCOUNTER — Telehealth: Payer: Self-pay | Admitting: Cardiology

## 2021-07-24 MED ORDER — FENOFIBRATE 145 MG PO TABS: 145.0000 mg | ORAL_TABLET | Freq: Every day | ORAL | 3 refills | Status: DC

## 2021-07-24 NOTE — Telephone Encounter (Signed)
Patient is requesting to discuss his lab results. ?

## 2021-07-24 NOTE — Telephone Encounter (Signed)
Called patient back and informed him of Dr. Thereasa Solo recommendation as documented below: ? ?Triglycerides elevated.  Did not tolerate Zetia in the past.  Start fenofibrate.  Continue statin as prescribed. ? ?Patient verbalized understanding and agreed with plan. ?

## 2021-08-06 ENCOUNTER — Encounter: Payer: Self-pay | Admitting: Family Medicine

## 2021-08-06 ENCOUNTER — Ambulatory Visit (INDEPENDENT_AMBULATORY_CARE_PROVIDER_SITE_OTHER): Payer: Medicare Other | Admitting: Family Medicine

## 2021-08-06 VITALS — BP 186/83 | HR 60 | Temp 98.4°F | Resp 16 | Ht 70.0 in | Wt 271.7 lb

## 2021-08-06 DIAGNOSIS — E1159 Type 2 diabetes mellitus with other circulatory complications: Secondary | ICD-10-CM

## 2021-08-06 DIAGNOSIS — G4733 Obstructive sleep apnea (adult) (pediatric): Secondary | ICD-10-CM | POA: Diagnosis not present

## 2021-08-06 DIAGNOSIS — Z0000 Encounter for general adult medical examination without abnormal findings: Secondary | ICD-10-CM

## 2021-08-06 DIAGNOSIS — E1142 Type 2 diabetes mellitus with diabetic polyneuropathy: Secondary | ICD-10-CM | POA: Diagnosis not present

## 2021-08-06 DIAGNOSIS — I152 Hypertension secondary to endocrine disorders: Secondary | ICD-10-CM | POA: Diagnosis not present

## 2021-08-06 DIAGNOSIS — D692 Other nonthrombocytopenic purpura: Secondary | ICD-10-CM | POA: Insufficient documentation

## 2021-08-06 MED ORDER — DESOXIMETASONE 0.25 % EX CREA: 1.0000 "application " | TOPICAL_CREAM | Freq: Two times a day (BID) | CUTANEOUS | 5 refills | Status: DC

## 2021-08-06 MED ORDER — LISINOPRIL 20 MG PO TABS: 20.0000 mg | ORAL_TABLET | Freq: Every day | ORAL | 1 refills | Status: DC

## 2021-08-06 NOTE — Assessment & Plan Note (Signed)
Severe Well controlled with BiPAP - using with good compliance

## 2021-08-06 NOTE — Assessment & Plan Note (Signed)
Chronic and previously well controlled Has previously had hypoglycemia with glimepiride Continue lifestyle management Recheck A1c Advised need for eye exam Foot exam today UACR today

## 2021-08-06 NOTE — Assessment & Plan Note (Signed)
BMI 38 and assoc with T2DM, HTN, HLD Discussed importance of healthy weight management Discussed diet and exercise  

## 2021-08-06 NOTE — Patient Instructions (Signed)
Consider asking at the pharmacy about the shingles shot (Shingrix)

## 2021-08-06 NOTE — Progress Notes (Signed)
I,Sulibeya S Dimas,acting as a Education administrator for [name], MD.,have documented all relevant documentation on the behalf of [name], MD,as directed by  [name], MD while in the presence of [name], MD.   Complete physical exam   Patient: Tony Williams   DOB: 01/20/1946   76 y.o. Male  MRN: [id number] Visit Date: 08/06/2021  Today's healthcare provider: [name], MD   Chief Complaint  Patient presents with   Annual Exam   Subjective    Tony Williams is a 76 y.o. male who presents today for a complete physical exam.  He reports consuming a general diet.  walking  He generally feels well. He reports sleeping well. He does not have additional problems to discuss today.  HPI  AWV done 05/19/2021  Past Medical History:  Diagnosis Date   CAD S/P percutaneous coronary angioplasty    CAD, multiple vessel    Cancer ([place]) 2013   Bladder   Chronic kidney disease    COPD (chronic obstructive pulmonary disease) (HCC)    Depression    Diabetes mellitus without complication (HCC)    Heart problem    Hx of CABG x 4    LIMA-D2, SVG-OM (occluded), SeqSVG-D1-LAD.   Myocardial infarction [facility])    Past Surgical History:  Procedure Laterality Date   CHOLECYSTECTOMY, LAPAROSCOPIC  12/26/1995   COLONOSCOPY WITH PROPOFOL N/A 09/23/2020   Procedure: COLONOSCOPY WITH PROPOFOL;  Surgeon: Lucilla Lame, MD;  Location: [facility];  Service: Endoscopy;  Laterality: N/A;   CORONARY ARTERY BYPASS GRAFT     LIMA-D2, SVG-OM, SeqSVG-D1-LAD   CORONARY STENT INTERVENTION N/A 11/12/2020   Procedure: CORONARY STENT INTERVENTION;  Surgeon: Wellington Hampshire, MD;  Location: [place] CV LAB;  Service: Cardiovascular;  Laterality: N/A;   EYE SURGERY     IABP INSERTION N/A 11/11/2020   Procedure: IABP Insertion;  Surgeon: Nelva Bush, MD;  Location: [place] CV LAB;  Service: Cardiovascular;  Laterality: N/A;   IR STENT PLACEMENT ANTE CAROTID INC  ANGIO     KNEE SURGERY     RIGHT/LEFT HEART CATH AND CORONARY/GRAFT ANGIOGRAPHY N/A 11/11/2020   Procedure: RIGHT/LEFT HEART CATH AND CORONARY/GRAFT ANGIOGRAPHY;  Surgeon: Nelva Bush, MD;  Location: [place] CV LAB;  Service: Cardiovascular;  Severe native CAD: 90% dLMCA-100% mLAD/100% ost LCx CTO w/ Severe diffuse RCA disease. Widely patent LIMA-D1. Patent sequential SVG-D2-LAD with 99% mid graft & 90% LIMA-D2 @ anastomosis. previously stented SVG-OM - CTO. Mildly   TRANSURETHRAL RESECTION OF PROSTATE  1994   URINARY SPHINCTER IMPLANT  1995   Social History   Socioeconomic History   Marital status: Married    Spouse name: Not on file   Number of children: 2   Years of education: Not on file   Highest education level: High school graduate  Occupational History   Occupation: retired  Tobacco Use   Smoking status: Former    Types: Cigarettes    Quit date: 10/23/2003    Years since quitting: 17.8   Smokeless tobacco: Never  Vaping Use   Vaping Use: Never used  Substance and Sexual Activity   Alcohol use: Never   Drug use: Never   Sexual activity: Not on file  Other Topics Concern   Not on file  Social History Narrative   Not on file   Social Determinants of Health   Financial Resource Strain: Low Risk    Difficulty of Paying Living Expenses: Not hard at all  Food Insecurity: No Food Insecurity  Worried About Charity fundraiser in the Last Year: Never true   [place] in the Last Year: Never true  Transportation Needs: No Transportation Needs   Lack of Transportation (Medical): No   Lack of Transportation (Non-Medical): No  Physical Activity: Inactive   Days of Exercise per Week: 0 days   Minutes of Exercise per Session: 0 min  Stress: No Stress Concern Present   Feeling of Stress : Only a little  Social Connections: Moderately Integrated   Frequency of Communication with Friends and Family: More than three times a week   Frequency of Social Gatherings  with Friends and Family: Twice a week   Attends Religious Services: More than 4 times per year   Active Member of [employer] or Organizations: No   Attends Archivist Meetings: Never   Marital Status: Married  Human resources officer Violence: Not At Risk   Fear of Current or Ex-Partner: No   Emotionally Abused: No   Physically Abused: No   Sexually Abused: No   Family Status  Relation Name Status   Mother  (Not Specified)   Father  (Not Specified)   Family History  Problem Relation Age of Onset   Heart Problems Mother    Heart Problems Father    Allergies  Allergen Reactions   Butorphanol Itching   Morphine Other (See Comments)    Redness around IV site   Pedi-Pre Tape Spray [Wound Dressing Adhesive] Itching   Simvastatin Other (See Comments)    "Destroyed my muscles"   Statins Other (See Comments)    "Destroyed my muscles"    Patient Care Team: Virginia Crews, MD as PCP - General (Family Medicine) Kate Sable, MD as PCP - Cardiology (Cardiology) Kate Sable, MD as Consulting Physician (Cardiology) [place], Gurley M, LCSW as Social Worker   Medications: Outpatient Medications Prior to Visit  Medication Sig   albuterol (VENTOLIN HFA) 108 (90 Base) MCG/ACT inhaler Inhale 2 puffs into the lungs every 6 (six) hours as needed for wheezing or shortness of breath.   allopurinol (ZYLOPRIM) 300 MG tablet TAKE 1 TABLET(300 MG) BY MOUTH DAILY   aspirin EC 81 MG tablet Take 81 mg by mouth daily. Swallow whole.   blood glucose meter kit and supplies Dispense based on patient and insurance preference. Use up to four times daily as directed. (FOR ICD-10 E10.9, E11.9). check blood glucose fasting and before meals four times daily.   Budeson-Glycopyrrol-Formoterol (BREZTRI AEROSPHERE) 160-9-4.8 MCG/ACT AERO Inhale 2 puffs into the lungs 2 (two) times daily.   cephALEXin (KEFLEX) 500 MG capsule Take 500 mg by mouth 2 (two) times daily.   Cholecalciferol (VITAMIN D3 PO)  Take 4,000 Units by mouth.   Coenzyme Q10 (CO Q-10) 400 MG CAPS Take by mouth daily at 6 (six) AM.   escitalopram (LEXAPRO) 20 MG tablet TAKE 1 TABLET(20 MG) BY MOUTH DAILY   fenofibrate (TRICOR) 145 MG tablet Take 1 tablet (145 mg total) by mouth daily.   gabapentin (NEURONTIN) 300 MG capsule Take 1 capsule (300 mg total) by mouth 2 (two) times daily AND 2 capsules (600 mg total) at bedtime.   HYDROcodone-acetaminophen (NORCO/VICODIN) 5-325 MG tablet Take 1 tablet by mouth every 6 (six) hours as needed for moderate pain.   lovastatin (MEVACOR) 40 MG tablet TAKE 1 TABLET(40 MG) BY MOUTH DAILY   Omega-3 Fatty Acids (FISH OIL) 1200 MG CAPS Take by mouth daily at 6 (six) AM.   prasugrel (EFFIENT) 10 MG TABS tablet  Take 1 tablet (10 mg total) by mouth daily.   torsemide (DEMADEX) 20 MG tablet Take 1 tablet (20 mg total) by mouth daily as needed.   traZODone (DESYREL) 150 MG tablet Take 1 tablet (150 mg total) by mouth at bedtime.   [DISCONTINUED] desoximetasone (TOPICORT) 0.25 % cream Apply 1 application topically 2 (two) times daily.   [DISCONTINUED] lisinopril (ZESTRIL) 10 MG tablet Take 1 tablet (10 mg total) by mouth daily.   No facility-administered medications prior to visit.    Review of Systems  All other systems reviewed and are negative.  Last CBC Lab Results  Component Value Date   WBC 11.9 (H) 03/11/2021   HGB 15.7 03/11/2021   HCT 46.5 03/11/2021   MCV 90.6 03/11/2021   MCH 30.6 03/11/2021   RDW 14.9 03/11/2021   PLT 269 20/94/7096   Last metabolic panel Lab Results  Component Value Date   GLUCOSE 162 (H) 03/11/2021   NA 134 (L) 03/11/2021   K 4.6 03/11/2021   CL 101 03/11/2021   CO2 25 03/11/2021   BUN 24 (H) 03/11/2021   CREATININE 1.70 (H) 03/11/2021   GFRNONAA 42 (L) 03/11/2021   CALCIUM 9.4 03/11/2021   PROT 7.3 01/26/2021   ALBUMIN 3.8 01/26/2021   LABGLOB 2.7 07/21/2020   AGRATIO 1.5 07/21/2020   BILITOT 0.5 01/26/2021   ALKPHOS 46 01/26/2021   AST 19  01/26/2021   ALT 10 01/26/2021   ANIONGAP 8 03/11/2021   Last lipids Lab Results  Component Value Date   CHOL 200 07/22/2021   HDL 33 (L) 07/22/2021   LDLCALC 90 07/22/2021   TRIG 384 (H) 07/22/2021   CHOLHDL 6.1 07/22/2021   Last hemoglobin A1c Lab Results  Component Value Date   HGBA1C 6.9 (A) 12/29/2020   Last thyroid functions Lab Results  Component Value Date   TSH 2.515 12/08/2020   Last vitamin D Lab Results  Component Value Date   VD25OH 38.8 04/22/2020      Objective     BP (!) 186/83 (BP Location: Left Arm, Patient Position: Sitting, Cuff Size: Large)   Pulse 60   Temp 98.4 F (36.9 C) (Oral)   Resp 16   Ht 5' 10"  (1.778 m)   Wt 271 lb 11.2 oz (123.2 kg)   BMI 38.98 kg/m  BP Readings from Last 3 Encounters:  08/06/21 (!) 186/83  05/29/21 (!) 146/84  04/24/21 130/80   Wt Readings from Last 3 Encounters:  08/06/21 271 lb 11.2 oz (123.2 kg)  05/29/21 264 lb 12.8 oz (120.1 kg)  04/24/21 261 lb (118.4 kg)      Physical Exam Vitals reviewed.  Constitutional:      General: He is not in acute distress.    Appearance: Normal appearance. He is well-developed. He is not diaphoretic.  HENT:     Head: Normocephalic and atraumatic.     Right Ear: Tympanic membrane, ear canal and external ear normal.     Left Ear: Tympanic membrane, ear canal and external ear normal.     Nose: Nose normal.     Mouth/Throat:     Mouth: Mucous membranes are moist.     Pharynx: Oropharynx is clear. No oropharyngeal exudate.  Eyes:     General: No scleral icterus.    Conjunctiva/sclera: Conjunctivae normal.     Pupils: Pupils are equal, round, and reactive to light.  Neck:     Thyroid: No thyromegaly.  Cardiovascular:     Rate and Rhythm: Normal rate  and regular rhythm.     Heart sounds: Normal heart sounds. No murmur heard. Pulmonary:     Effort: Pulmonary effort is normal. No respiratory distress.     Breath sounds: Normal breath sounds. No wheezing or rales.   Abdominal:     General: There is no distension.     Palpations: Abdomen is soft.     Tenderness: There is no abdominal tenderness.  Musculoskeletal:        General: No deformity.     Cervical back: Neck supple.     Right lower leg: No edema.     Left lower leg: No edema.  Lymphadenopathy:     Cervical: No cervical adenopathy.  Skin:    General: Skin is warm and dry.     Findings: No rash.  Neurological:     Mental Status: He is alert and oriented to person, place, and time. Mental status is at baseline.     Sensory: No sensory deficit.     Motor: No weakness.     Gait: Gait normal.  Psychiatric:        Mood and Affect: Mood normal.        Behavior: Behavior normal.        Thought Content: Thought content normal.      Last depression screening scores    08/06/2021    3:50 PM 05/19/2021    3:03 PM 04/16/2021    2:20 PM  PHQ 2/9 Scores  PHQ - 2 Score 2 0 1  PHQ- 9 Score 3 1 5    Last fall risk screening    08/06/2021    3:50 PM  [place] in the past year? 0  Number falls in past yr: 0  Injury with Fall? 0  Risk for fall due to : No Fall Risks  Follow up Falls evaluation completed   Last Audit-C alcohol use screening    05/19/2021    3:02 PM  Alcohol Use Disorder Test (AUDIT)  1. How often do you have a drink containing alcohol? 0  2. How many drinks containing alcohol do you have on a typical day when you are drinking? 0  3. How often do you have six or more drinks on one occasion? 0  AUDIT-C Score 0   A score of 3 or more in women, and 4 or more in men indicates increased risk for alcohol abuse, EXCEPT if all of the points are from question 1   No results found for any visits on 08/06/21.  Assessment & Plan    Routine Health Maintenance and Physical Exam  Exercise Activities and Dietary recommendations  Goals      DIET - EAT MORE FRUITS AND VEGETABLES     DIET - INCREASE WATER INTAKE     Recommend to drink at least 6-8 8oz glasses of water per  day.     Manage My Emotions     Timeframe:  Long-Range Goal Priority:  Medium Start Date:     02/26/21                        Expected End Date:    04/21/21                   Follow Up Date 04/21/21    - begin personal counseling - practice relaxation or meditation daily - talk about feelings with a friend, family or spiritual advisor - practice positive thinking and  self-talk  -Continue to consider follow up with [facility] if needed   Why is this important?   When you are stressed, down or upset, your body reacts too.  For example, your blood pressure may get higher; you may have a headache or stomachache.  When your emotions get the best of you, your body's ability to fight off cold and flu gets weak.  These steps will help you manage your emotions.     Notes:         Immunization History  Administered Date(s) Administered   Fluad Quad(high Dose 65+) 12/10/2020   Influenza, High Dose Seasonal PF 01/13/2020   Moderna Sars-Covid-2 Vaccination 04/05/2019, 05/03/2019, 01/10/2020   Pneumococcal Conjugate-13 10/10/2014   Pneumococcal Polysaccharide-23 12/29/2018, 07/21/2020   Td 05/10/2017    Health Maintenance  Topic Date Due   OPHTHALMOLOGY EXAM  Never done   Zoster Vaccines- Shingrix (1 of 2) Never done   COVID-19 Vaccine (4 - Booster for Moderna series) 03/06/2020   HEMOGLOBIN A1C  06/29/2021   INFLUENZA VACCINE  10/13/2021   FOOT EXAM  08/07/2022   TETANUS/TDAP  05/11/2027   COLONOSCOPY (Pts 45-19yr Insurance coverage will need to be confirmed)  09/24/2030   Pneumonia Vaccine 76 Years old  Completed   Hepatitis C Screening  Completed   HPV VACCINES  Aged Out    Discussed health benefits of physical activity, and encouraged him to engage in regular exercise appropriate for his age and condition.  Problem List Items Addressed This Visit       Cardiovascular and Mediastinum   Hypertension associated with diabetes ([facility]    Chronic and  uncontrolled Increase lisinopril to 20 mg daily Recheck metabolic panel Upcoming follow-up with cardiology in 2 to 3 months for recheck       Relevant Medications   lisinopril (ZESTRIL) 20 MG tablet   Other Relevant Orders   Comprehensive metabolic panel     Respiratory   OSA (obstructive sleep apnea)    Severe Well controlled with BiPAP - using with good compliance         Endocrine   Controlled type 2 diabetes mellitus with diabetic polyneuropathy, without long-term current use of insulin (HCC)    Chronic and previously well controlled Has previously had hypoglycemia with glimepiride Continue lifestyle management Recheck A1c Advised need for eye exam Foot exam today UACR today       Relevant Medications   lisinopril (ZESTRIL) 20 MG tablet   Diabetic polyneuropathy associated with type 2 diabetes mellitus (HCC)    Chronic and fairly well controlled Continue gabapentin       Relevant Medications   lisinopril (ZESTRIL) 20 MG tablet   Other Relevant Orders   Hemoglobin A1c   Microalbumin / creatinine urine ratio     Other   Morbid obesity (HCC)    BMI 38 and assoc with T2DM, HTN, HLD Discussed importance of healthy weight management Discussed diet and exercise       Other Visit Diagnoses     Encounter for annual physical exam    -  Primary   Relevant Orders   Comprehensive metabolic panel   Hemoglobin A1c   Microalbumin / creatinine urine ratio   Senile purpura (HCC)       Relevant Medications   lisinopril (ZESTRIL) 20 MG tablet        Return in about 6 months (around 02/06/2022) for chronic disease f/u.     I, ALavon Paganini MD, have reviewed all documentation  for this visit. The documentation on 08/06/21 for the exam, diagnosis, procedures, and orders are all accurate and complete.   Brenae Lasecki, Dionne Bucy, MD, MPH [facility]

## 2021-08-06 NOTE — Assessment & Plan Note (Signed)
Chronic and uncontrolled Increase lisinopril to 20 mg daily Recheck metabolic panel Upcoming follow-up with cardiology in 2 to 3 months for recheck

## 2021-08-06 NOTE — Assessment & Plan Note (Signed)
Chronic and fairly well controlled Continue gabapentin

## 2021-08-07 ENCOUNTER — Telehealth: Payer: Self-pay

## 2021-08-07 DIAGNOSIS — E1142 Type 2 diabetes mellitus with diabetic polyneuropathy: Secondary | ICD-10-CM

## 2021-08-07 DIAGNOSIS — N1831 Chronic kidney disease, stage 3a: Secondary | ICD-10-CM

## 2021-08-07 LAB — COMPREHENSIVE METABOLIC PANEL
ALT: 12 IU/L (ref 0–44)
AST: 17 IU/L (ref 0–40)
Albumin/Globulin Ratio: 1.5 (ref 1.2–2.2)
Albumin: 4.2 g/dL (ref 3.7–4.7)
Alkaline Phosphatase: 47 IU/L (ref 44–121)
BUN/Creatinine Ratio: 17 (ref 10–24)
BUN: 34 mg/dL — ABNORMAL HIGH (ref 8–27)
Bilirubin Total: 0.3 mg/dL (ref 0.0–1.2)
CO2: 23 mmol/L (ref 20–29)
Calcium: 9.4 mg/dL (ref 8.6–10.2)
Chloride: 98 mmol/L (ref 96–106)
Creatinine, Ser: 2.05 mg/dL — ABNORMAL HIGH (ref 0.76–1.27)
Globulin, Total: 2.8 g/dL (ref 1.5–4.5)
Glucose: 166 mg/dL — ABNORMAL HIGH (ref 70–99)
Potassium: 4.9 mmol/L (ref 3.5–5.2)
Sodium: 136 mmol/L (ref 134–144)
Total Protein: 7 g/dL (ref 6.0–8.5)
eGFR: 33 mL/min/{1.73_m2} — ABNORMAL LOW (ref >59)

## 2021-08-07 LAB — MICROALBUMIN / CREATININE URINE RATIO
Creatinine, Urine: 74.9 mg/dL
Microalb/Creat Ratio: 356 mg/g creat — ABNORMAL HIGH (ref 0–29)
Microalbumin, Urine: 266.8 ug/mL

## 2021-08-07 LAB — HEMOGLOBIN A1C
Est. average glucose Bld gHb Est-mCnc: 197 mg/dL
Hgb A1c MFr Bld: 8.5 % — ABNORMAL HIGH (ref 4.8–5.6)

## 2021-08-07 MED ORDER — EMPAGLIFLOZIN 25 MG PO TABS: 25.0000 mg | ORAL_TABLET | Freq: Every day | ORAL | 0 refills | Status: DC

## 2021-08-07 NOTE — Telephone Encounter (Signed)
-----   Message from Virginia Crews, MD sent at 08/07/2021  8:04 AM EDT ----- Some worsening in kidney function and uncontrolled A1c. Recommend starting Jardiance 25 mg daily.  This is good for the kidneys and blood sugar control.  Please send in Rx if patient agrees.  He also needs to be seeing nephrology.  If he has not already, please send referral.  Needs follow-up appointment in 3 months to recheck.

## 2021-08-07 NOTE — Telephone Encounter (Signed)
Please see result note 

## 2021-08-12 ENCOUNTER — Other Ambulatory Visit: Payer: Self-pay | Admitting: Family Medicine

## 2021-08-16 DIAGNOSIS — G4733 Obstructive sleep apnea (adult) (pediatric): Secondary | ICD-10-CM | POA: Diagnosis not present

## 2021-08-26 ENCOUNTER — Other Ambulatory Visit: Payer: Self-pay | Admitting: Family Medicine

## 2021-08-26 NOTE — Telephone Encounter (Signed)
Requested medication (s) are due for refill today: yes  Requested medication (s) are on the active medication list: yes  Last refill:  07/23/21 #30 0 refills  Future visit scheduled: yes in 5 months  Notes to clinic:  not delegated per protocol. Do you want to refill Rx?     Requested Prescriptions  Pending Prescriptions Disp Refills   HYDROcodone-acetaminophen (NORCO/VICODIN) 5-325 MG tablet 30 tablet 0    Sig: Take 1 tablet by mouth every 6 (six) hours as needed for moderate pain.     Not Delegated - Analgesics:  Opioid Agonist Combinations Failed - 08/26/2021 12:44 PM      Failed - This refill cannot be delegated      Failed - Urine Drug Screen completed in last 360 days      Failed - Valid encounter within last 3 months    Recent Outpatient Visits           2 weeks ago Encounter for annual physical exam   [facility] [place], Dionne Bucy, MD   4 months ago GAD (generalized anxiety disorder)   [facility], Dionne Bucy, MD   4 months ago Otalgia, unspecified laterality   [employer], Dani Gobble, PA-C   4 months ago [facility], Dugger, DO   4 months ago [place] Bacigalupo, Dionne Bucy, MD       Future Appointments             In 1 week Chesley Mires, MD [place]   In 2 months [place], Aaron Edelman, MD [facility] [place]   In 5 months Bacigalupo, Dionne Bucy, MD [facility], [place]

## 2021-08-26 NOTE — Telephone Encounter (Signed)
Copied from [place]. Topic: General - Other >> Aug 26, 2021 11:24 AM Everette C wrote: Reason for CRM: Medication Refill - Medication: HYDROcodone-acetaminophen (NORCO/VICODIN) 5-325 MG tablet [[id number]]   Has the patient contacted their pharmacy? No. (Agent: If no, request that the patient contact the pharmacy for the refill. If patient does not wish to contact the pharmacy document the reason why and proceed with request.) (Agent: If yes, when and what did the pharmacy advise?)  Preferred Pharmacy (with phone number or street name): [id number] - [place], [place] [address] [place] [address] Phone: [phone] Fax: [phone] Hours: Not open 24 hours   Has the patient been seen for an appointment in the last year OR does the patient have an upcoming appointment? Yes.    Agent: Please be advised that RX refills may take up to 3 business days. We ask that you follow-up with your pharmacy.

## 2021-08-27 MED ORDER — HYDROCODONE-ACETAMINOPHEN 5-325 MG PO TABS: 1.0000 | ORAL_TABLET | Freq: Four times a day (QID) | ORAL | 0 refills | Status: DC | PRN

## 2021-09-03 ENCOUNTER — Encounter: Payer: Self-pay | Admitting: Pulmonary Disease

## 2021-09-03 ENCOUNTER — Ambulatory Visit: Payer: Medicare Other | Admitting: Pulmonary Disease

## 2021-09-03 VITALS — BP 138/80 | HR 60 | Temp 98.3°F | Ht 70.0 in | Wt 273.2 lb

## 2021-09-03 DIAGNOSIS — J310 Chronic rhinitis: Secondary | ICD-10-CM

## 2021-09-03 DIAGNOSIS — J984 Other disorders of lung: Secondary | ICD-10-CM | POA: Diagnosis not present

## 2021-09-03 DIAGNOSIS — G4733 Obstructive sleep apnea (adult) (pediatric): Secondary | ICD-10-CM | POA: Diagnosis not present

## 2021-09-03 DIAGNOSIS — J449 Chronic obstructive pulmonary disease, unspecified: Secondary | ICD-10-CM

## 2021-09-03 NOTE — Patient Instructions (Signed)
Follow up in 6 months 

## 2021-09-03 NOTE — Progress Notes (Signed)
[facility]  Chief Complaint  Patient presents with   Follow-up    Wearing cpap avg 6hr nightly- pressure is okay. Mask is uncomfortable.     Past Surgical History:  He  has a past surgical history that includes Eye surgery; Coronary artery bypass graft; IR STENT PLACEMENT ANTE CAROTID INC ANGIO; Knee surgery; Colonoscopy with propofol (N/A, 09/23/2020); Transurethral resection of prostate (1994); Urinary sphincter implant (1995); Cholecystectomy, laparoscopic (12/26/1995); RIGHT/LEFT HEART CATH AND CORONARY/GRAFT ANGIOGRAPHY (N/A, 11/11/2020); IABP Insertion (N/A, 11/11/2020); and CORONARY STENT INTERVENTION (N/A, 11/12/2020).  Past Medical History:  CAD s/p CABG, Bladder cancer, CKD 4, COPD, Depression, DM type 2  Constitutional:  BP 138/80 (BP Location: Left Arm, Cuff Size: Large)   Pulse 60   Temp 98.3 F (36.8 C) (Temporal)   Ht 5\' 10"  (1.778 m)   Wt 273 lb 3.2 oz (123.9 kg)   SpO2 95%   BMI 39.20 kg/m   Brief Summary:  Tony Williams is a 76 y.o. 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      Subjective:   He is here with his wife.  PFT from November showed mixed obstructive and restrictive defect with diffusion defect.  V/Q scan was normal.  Sleep study from March showed severe sleep apnea.  He is using Bipap nightly.  Sleeping much better.  Gets sinus congestion and cough when he is going to sleep.  Has full face mask.  This is comfortable enough for him to keep up with.  Breathing okay.  Not having wheeze, or chest congestion.  Physical Exam:   Appearance - well kempt   ENMT - no sinus tenderness, no oral exudate, no LAN, Mallampati 3 airway, no stridor  Respiratory - equal breath sounds bilaterally, no wheezing or rales  CV - s1s2 regular rate and rhythm, no murmurs  Ext - no clubbing, no edema  Skin - no rashes  Psych  - normal mood and affect   Pulmonary testing:  PFT 01/22/21 >> FEV1 1.54 (51%), FEV1% 75, TLC 3.43 (50%), DLCO 54%  Chest Imaging:  CT angio chest 11/08/20 >> fibrothorax on Rt with volume loss V/Q scan 01/28/21 >> low probability  Sleep Tests:  PSG 05/13/21 >> AHI 65.8, SpO2 low 84% Auto Bipap 5/22/3 to 09/01/21 >> used on 29 of 30 nights with average 5 hrs 3 min.  Average AHI 1.1 with median Bipap 17/13 cm H2O  Cardiac Tests:  Echo 11/16/20 >> EF 60 to 65%  Social History:  He  reports that he quit smoking about 17 years ago. His smoking use included cigarettes. He has a 30.00 pack-year smoking history. He has never used smokeless tobacco. He reports that he does not drink alcohol and does not use drugs.  Family History:  His family history includes Heart Problems in his father and mother.      Assessment/Plan:   Obstructive sleep apnea. - he is complaint with therapy and reports benefit from Bipap - he uses [facility] for his DME - current auto Bipap ordered on 05/29/21 - continue auto Bipap with max IPAP 18, min EPAP 10, PS 0 cm H2O  COPD with chronic bronchitis. - advised him to use Breztri on regular basis - prn albuterol  Restrictive lung disease. 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 - he can use nasal irrigation before going to bed  CAD  s/p CABG. - followed by Dr. Debbe Odea with [facility] in [facility]  Obesity. - discussed how weight can impact sleep and risk for sleep disordered breathing - discussed options to assist with weight loss: combination of diet modification, cardiovascular and strength training exercises   Time Spent Involved in Patient Care on Day of Examination:  37 minutes  Follow up:   Patient Instructions  Follow up in 6 months  Medication List:   Allergies as of 09/03/2021       Reactions   Butorphanol Itching   Morphine Other (See Comments)   Redness around  IV site   Pedi-pre Tape Spray [wound Dressing Adhesive] Itching   Simvastatin Other (See Comments)   "Destroyed my muscles"   Statins Other (See Comments)   "Destroyed my muscles"        Medication List        Accurate as of September 03, 2021  9:18 AM. If you have any questions, ask your nurse or doctor.          STOP taking these medications    cephALEXin 500 MG capsule Commonly known as: KEFLEX Stopped by: Coralyn Helling, MD       TAKE these medications    albuterol 108 (90 Base) MCG/ACT inhaler Commonly known as: VENTOLIN HFA Inhale 2 puffs into the lungs every 6 (six) hours as needed for wheezing or shortness of breath.   allopurinol 300 MG tablet Commonly known as: ZYLOPRIM TAKE 1 TABLET(300 MG) BY MOUTH DAILY   aspirin EC 81 MG tablet Take 81 mg by mouth daily. Swallow whole.   blood glucose meter kit and supplies Dispense based on patient and insurance preference. Use up to four times daily as directed. (FOR ICD-10 E10.9, E11.9). check blood glucose fasting and before meals four times daily.   Breztri Aerosphere 160-9-4.8 MCG/ACT Aero Generic drug: Budeson-Glycopyrrol-Formoterol Inhale 2 puffs into the lungs 2 (two) times daily.   Co Q-10 400 MG Caps Take by mouth daily at 6 (six) AM.   desoximetasone 0.25 % cream Commonly known as: TOPICORT Apply 1 application. topically 2 (two) times daily.   empagliflozin 25 MG Tabs tablet Commonly known as: JARDIANCE Take 1 tablet (25 mg total) by mouth daily before breakfast.   escitalopram 20 MG tablet Commonly known as: LEXAPRO TAKE 1 TABLET(20 MG) BY MOUTH DAILY   fenofibrate 145 MG tablet Commonly known as: Tricor Take 1 tablet (145 mg total) by mouth daily.   Fish Oil 1200 MG Caps Take by mouth daily at 6 (six) AM.   gabapentin 300 MG capsule Commonly known as: NEURONTIN Take 1 capsule (300 mg total) by mouth 2 (two) times daily AND 2 capsules (600 mg total) at bedtime.   HYDROcodone-acetaminophen  5-325 MG tablet Commonly known as: NORCO/VICODIN Take 1 tablet by mouth every 6 (six) hours as needed for moderate pain.   lisinopril 20 MG tablet Commonly known as: ZESTRIL Take 1 tablet (20 mg total) by mouth daily.   lovastatin 40 MG tablet Commonly known as: MEVACOR TAKE 1 TABLET(40 MG) BY MOUTH DAILY   prasugrel 10 MG Tabs tablet Commonly known as: Effient Take 1 tablet (10 mg total) by mouth daily.   torsemide 20 MG tablet Commonly known as: DEMADEX Take 1 tablet (20 mg total) by mouth daily as needed.   traZODone 150 MG tablet Commonly known as: DESYREL Take 1 tablet (150 mg total) by mouth at bedtime.   VITAMIN D3 PO Take 4,000 Units by mouth.  Signature:  Coralyn Helling, MD [facility] Pager - [phone] 09/03/2021, 9:18 AM

## 2021-09-17 ENCOUNTER — Ambulatory Visit (INDEPENDENT_AMBULATORY_CARE_PROVIDER_SITE_OTHER): Payer: Medicare Other | Admitting: Physician Assistant

## 2021-09-17 ENCOUNTER — Other Ambulatory Visit: Payer: Self-pay | Admitting: Physician Assistant

## 2021-09-17 ENCOUNTER — Ambulatory Visit
Admission: RE | Admit: 2021-09-17 | Discharge: 2021-09-17 | Disposition: A | Discharge status: Home / Self Care | Payer: Medicare Other | Source: Ambulatory Visit | Attending: Physician Assistant | Admitting: Physician Assistant

## 2021-09-17 DIAGNOSIS — E118 Type 2 diabetes mellitus with unspecified complications: Secondary | ICD-10-CM

## 2021-09-17 DIAGNOSIS — M25562 Pain in left knee: Secondary | ICD-10-CM | POA: Insufficient documentation

## 2021-09-17 DIAGNOSIS — I5033 Acute on chronic diastolic (congestive) heart failure: Secondary | ICD-10-CM

## 2021-09-17 DIAGNOSIS — E1159 Type 2 diabetes mellitus with other circulatory complications: Secondary | ICD-10-CM

## 2021-09-17 DIAGNOSIS — M79662 Pain in left lower leg: Secondary | ICD-10-CM | POA: Diagnosis not present

## 2021-09-17 DIAGNOSIS — G8929 Other chronic pain: Secondary | ICD-10-CM

## 2021-09-17 DIAGNOSIS — M25561 Pain in right knee: Secondary | ICD-10-CM | POA: Diagnosis not present

## 2021-09-17 DIAGNOSIS — M7989 Other specified soft tissue disorders: Secondary | ICD-10-CM | POA: Diagnosis not present

## 2021-09-17 DIAGNOSIS — I152 Hypertension secondary to endocrine disorders: Secondary | ICD-10-CM

## 2021-09-17 MED ORDER — GLIMEPIRIDE 1 MG PO TABS: 1.0000 mg | ORAL_TABLET | Freq: Every day | ORAL | 0 refills | Status: DC

## 2021-09-17 NOTE — Progress Notes (Signed)
I,Tony Williams,acting as a Education administrator for [employer], PA-C.,have documented all relevant documentation on the behalf of Tony Speak, PA-C,as directed by  [employer], PA-C while in the presence of [employer], PA-C.   Established patient visit   Patient: Tony Williams   DOB: September 16, 1945   76 y.o. Male  MRN: [id number] Visit Date: 09/17/2021  Today's healthcare provider: [name], PA-C   CC: knee pain  Subjective    Patient present for bruised knot on inside of left knee.  Noticed 2 weeks ago.  Reports he thinks it's getting bigger and not going away.  Does not remember hitting it or any injury. Worried that it could be a "blood clot." Reports having a dull /throbbing pain   Medications: Outpatient Medications Prior to Visit  Medication Sig   albuterol (VENTOLIN HFA) 108 (90 Base) MCG/ACT inhaler Inhale 2 puffs into the lungs every 6 (six) hours as needed for wheezing or shortness of breath.   allopurinol (ZYLOPRIM) 300 MG tablet TAKE 1 TABLET(300 MG) BY MOUTH DAILY   aspirin EC 81 MG tablet Take 81 mg by mouth daily. Swallow whole.   blood glucose meter kit and supplies Dispense based on patient and insurance preference. Use up to four times daily as directed. (FOR ICD-10 E10.9, E11.9). check blood glucose fasting and before meals four times daily.   Budeson-Glycopyrrol-Formoterol (BREZTRI AEROSPHERE) 160-9-4.8 MCG/ACT AERO Inhale 2 puffs into the lungs 2 (two) times daily.   Cholecalciferol (VITAMIN D3 PO) Take 4,000 Units by mouth.   Coenzyme Q10 (CO Q-10) 400 MG CAPS Take by mouth daily at 6 (six) AM.   desoximetasone (TOPICORT) 0.25 % cream Apply 1 application. topically 2 (two) times daily.   empagliflozin (JARDIANCE) 25 MG TABS tablet Take 1 tablet (25 mg total) by mouth daily before breakfast.   escitalopram (LEXAPRO) 20 MG tablet TAKE 1 TABLET(20 MG) BY MOUTH DAILY   fenofibrate (TRICOR) 145 MG tablet Take 1 tablet (145 mg total) by mouth daily.   gabapentin  (NEURONTIN) 300 MG capsule Take 1 capsule (300 mg total) by mouth 2 (two) times daily AND 2 capsules (600 mg total) at bedtime.   HYDROcodone-acetaminophen (NORCO/VICODIN) 5-325 MG tablet Take 1 tablet by mouth every 6 (six) hours as needed for moderate pain.   lisinopril (ZESTRIL) 20 MG tablet Take 1 tablet (20 mg total) by mouth daily.   lovastatin (MEVACOR) 40 MG tablet TAKE 1 TABLET(40 MG) BY MOUTH DAILY   Omega-3 Fatty Acids (FISH OIL) 1200 MG CAPS Take by mouth daily at 6 (six) AM.   prasugrel (EFFIENT) 10 MG TABS tablet Take 1 tablet (10 mg total) by mouth daily.   torsemide (DEMADEX) 20 MG tablet Take 1 tablet (20 mg total) by mouth daily as needed.   traZODone (DESYREL) 150 MG tablet Take 1 tablet (150 mg total) by mouth at bedtime.   No facility-administered medications prior to visit.    Review of Systems  Constitutional: Negative.  Negative for activity change and appetite change.  Respiratory: Negative.    Cardiovascular: Negative.   Skin:  Positive for color change.  Neurological: Negative.   Psychiatric/Behavioral:  Positive for agitation.    See HPI    Objective    There were no vitals taken for this visit.   Physical Exam Vitals reviewed.  Constitutional:      General: He is not in acute distress.    Appearance: Normal appearance. He is obese. He is not diaphoretic.  HENT:  Head: Normocephalic and atraumatic.  Eyes:     General: No scleral icterus.    Extraocular Movements: Extraocular movements intact.     Conjunctiva/sclera: Conjunctivae normal.     Pupils: Pupils are equal, round, and reactive to light.  Cardiovascular:     Rate and Rhythm: Normal rate and regular rhythm.     Pulses: Normal pulses.     Heart sounds: Normal heart sounds. No murmur heard. Pulmonary:     Effort: Pulmonary effort is normal. No respiratory distress.     Breath sounds: Normal breath sounds. No wheezing or rhonchi.  Musculoskeletal:        General: Normal range of  motion.     Cervical back: Normal range of motion and neck supple.     Right lower leg: No edema.     Left lower leg: No edema.  Lymphadenopathy:     Cervical: No cervical adenopathy.  Skin:    General: Skin is warm and dry.     Findings: Bruising and erythema present. No rash.     Comments: Firm lump/node(3 x 1.5 cm) with erythema, and ecchymosis(3 x 6 in), warm and tender on palpation noted/medial aspect of the left knee  Neurological:     Mental Status: He is alert and oriented to person, place, and time. Mental status is at baseline.  Psychiatric:        Behavior: Behavior normal.        Thought Content: Thought content normal.        Judgment: Judgment normal.     Results for orders placed or performed in visit on 09/17/21  Pro b natriuretic peptide (BNP)[facility])  Result Value Ref Range   NT-Pro BNP 715 (H) 0 - 486 pg/mL    Assessment & Plan     1. Chronic pain of both knees Chronic and stable Continue current regimen  2. Hypertension associated with diabetes ([place]) BP today was 154/87?epic crashed and no data was saved Advised to measure his BP at home and bring his records to the next appt Continue current medication regimen/lisinopril 64m daily  3. Acute on chronic diastolic CHF (congestive heart failure) (HCC) Abnormal lung exam - Pro b natriuretic peptide (BNP)[facility] [facility]) Continue torsemide as prescribed  4. Acute pain of left knee Could be bruise/injury/fat necrosis or less likely DVT - [place]ous Img Lower Unilateral Left (DVT); Future  5. Controlled type 2 diabetes mellitus with complication, without long-term current use of insulin (HCC) Last A1C was 8.5. - glimepiride (AMARYL) 1 MG tablet; Take 1 tablet (1 mg total) by mouth daily with breakfast.  Dispense: 30 tablet; Refill: 0 Pt needs to take 1/2 tab daily and monitor his BS daily for hypoglycemia Continue Jardiance 25 mg daily An appt with Cardiology  was scheduled for August  Chronic and previously well controlled Has previously had hypoglycemia with glimepiride Continue lifestyle management Advised need for eye exam Foot exam today     FU as scheduled. The patient was advised to call back or seek an in-person evaluation if the symptoms worsen or if the condition fails to improve as anticipated.  I discussed the assessment and treatment plan with the patient. The patient was provided an opportunity to ask questions and all were answered. The patient agreed with the plan and demonstrated an understanding of the instructions.  The entirety of the information documented in the History of Present Illness, Review of Systems and Physical Exam were personally obtained by me.  Portions of this information were initially documented by the CMA and reviewed by me for thoroughness and accuracy.  Portions of this note were created using dictation software and may contain typographical errors.       Total encounter time more than 30 minutes  Greater than 50% was spent in counseling and coordination of care with the patient     Elberta Leatherwood  [facility] [phone] (phone) [phone] (fax)  [place]

## 2021-09-18 LAB — PRO B NATRIURETIC PEPTIDE: NT-Pro BNP: 715 pg/mL — ABNORMAL HIGH (ref 0–486)

## 2021-09-18 NOTE — Progress Notes (Signed)
Hello Tony Williams ,   Your imaging results all are back as well as lab results.  1. No evidence of deep venous thrombosis within the left lower extremity. 2. Suspected area of prior hematoma or fat necrosis in the medial left knee, corresponding to the area of ecchymosis and palpable abnormality.  Fat necrosis usually doesn't need treatment and will go away on its own in time. If you have pain or tenderness around the lump, over-the-counter anti-inflammatory medications like ibuprofen (Advil, Motrin) can help. You can also try massaging the area or applying a warm compress.  Lab results are stable for you Any questions please reach out to the office or message me on MyChart!  Best, [name], PA-C

## 2021-09-18 NOTE — Telephone Encounter (Signed)
Requested medication (s) are due for refill today: no  Requested medication (s) are on the active medication list: yes  Last refill:  09/17/21  Future visit scheduled: yes  Notes to clinic:  Unable to refill per protocol, Rx was refilled 09/17/21 for 30 days. Pharmacy request 90 day supply. Possible duplicate.     Requested Prescriptions  Pending Prescriptions Disp Refills   glimepiride (AMARYL) 1 MG tablet [Pharmacy Med Name: GLIMEPIRIDE '1MG'$  TABLETS] 90 tablet     Sig: TAKE 1 TABLET(1 MG) BY MOUTH DAILY WITH BREAKFAST     Endocrinology:  Diabetes - Sulfonylureas Failed - 09/17/2021  4:55 PM      Failed - HBA1C is between 0 and 7.9 and within 180 days    Hgb A1c MFr Bld  Date Value Ref Range Status  08/06/2021 8.5 (H) 4.8 - 5.6 % Final    Comment:             Prediabetes: 5.7 - 6.4          Diabetes: >6.4          Glycemic control for adults with diabetes: <7.0          Failed - Cr in normal range and within 360 days    Creatinine, Ser  Date Value Ref Range Status  08/06/2021 2.05 (H) 0.76 - 1.27 mg/dL Final         Passed - Valid encounter within last 6 months    Recent Outpatient Visits           Yesterday Chronic pain of both knees   [facility], Bertha, PA-C   1 month ago Encounter for annual physical exam   [employer], Dionne Bucy, MD   5 months ago GAD (generalized anxiety disorder)   [facility] [place], Dionne Bucy, MD   5 months ago Otalgia, unspecified laterality   [employer], Dani Gobble, PA-C   5 months ago [place], Jake Church, DO       Future Appointments             In 2 weeks Cheryll Cockayne, Letitia Libra, PA-C [employer], [place]   In 1 month [place], Aaron Edelman, MD [employer]   In 4 months [place], Dionne Bucy, MD [employer], [place]

## 2021-09-21 NOTE — Telephone Encounter (Signed)
Pt scheduled for Wed 09-23-21.

## 2021-09-22 NOTE — Progress Notes (Unsigned)
I,Tony Williams,acting as a Education administrator for [employer], PA-C.,have documented all relevant documentation on the behalf of Tony Speak, PA-C,as directed by  [employer], PA-C while in the presence of [employer], PA-C.   Established patient visit   Patient: Tony Williams   DOB: 05/10/1945   76 y.o. Male  MRN: [id number] Visit Date: 09/23/2021  Today's healthcare provider: [name], PA-C   Chief Complaint  Patient presents with   Diabetes   Subjective    Diabetes Mellitus Type II, Follow-up  Lab Results  Component Value Date   HGBA1C 8.5 (H) 08/06/2021   HGBA1C 6.9 (A) 12/29/2020   HGBA1C 5.9 (A) 10/21/2020   Wt Readings from Last 3 Encounters:  09/23/21 272 lb (123.4 kg)  09/03/21 273 lb 3.2 oz (123.9 kg)  08/06/21 271 lb 11.2 oz (123.2 kg)   Last seen for diabetes 08-06-21. Management since then includes: - glimepiride (AMARYL) 1 MG tablet; daily with breakfast. Continue Jardiance 25 mg daily An appt with Cardiology was scheduled for August.    He reports excellent compliance with treatment. He is not having side effects.  Symptoms: No fatigue No foot ulcerations  No appetite changes No nausea  Yes paresthesia of the feet  No polydipsia  No polyuria No visual disturbances   No vomiting     Home blood sugar records:  not being checked  Episodes of hypoglycemia? No    Current insulin regiment: none Most Recent Eye Exam:  Current exercise: swimming Current diet habits: in general, an "unhealthy" diet  Pertinent Labs: Lab Results  Component Value Date   CHOL 200 07/22/2021   HDL 33 (L) 07/22/2021   LDLCALC 90 07/22/2021   TRIG 384 (H) 07/22/2021   CHOLHDL 6.1 07/22/2021   Lab Results  Component Value Date   NA 136 08/06/2021   K 4.9 08/06/2021   CREATININE 2.05 (H) 08/06/2021   EGFR 33 (L) 08/06/2021   MICROALBUR 100 07/21/2020   LABMICR 266.8 08/06/2021      --------------------------------------------------------------------------------------------------- Hypertension, follow-up  BP Readings from Last 3 Encounters:  09/23/21 133/64  09/03/21 138/80  08/06/21 (!) 186/83   Wt Readings from Last 3 Encounters:  09/23/21 272 lb (123.4 kg)  09/03/21 273 lb 3.2 oz (123.9 kg)  08/06/21 271 lb 11.2 oz (123.2 kg)     He was last seen for hypertension 1 weeks ago.  BP at that visit was 154/87. Management since that visit includes: Advised to measure his BP at home and bring his records to the next appt.  Continue current medication regimen/lisinopril 7m daily.  He reports excellent compliance with treatment. He is not having side effects.  He is following a Regular, Low Sodium diet. He is exercising. He does not smoke.  Use of agents associated with hypertension: none.   Outside blood pressures are 140-160s. Symptoms: No chest pain No chest pressure  No palpitations No syncope  No dyspnea No orthopnea  No paroxysmal nocturnal dyspnea No lower extremity edema   Pertinent labs Lab Results  Component Value Date   CHOL 200 07/22/2021   HDL 33 (L) 07/22/2021   LDLCALC 90 07/22/2021   TRIG 384 (H) 07/22/2021   CHOLHDL 6.1 07/22/2021   Lab Results  Component Value Date   NA 136 08/06/2021   K 4.9 08/06/2021   CREATININE 2.05 (H) 08/06/2021   EGFR 33 (L) 08/06/2021   GLUCOSE 166 (H) 08/06/2021   TSH 2.515 12/08/2020     The ASCVD Risk  score (Arnett DK, et al., 2019) failed to calculate for the following reasons:   The patient has a prior MI or stroke diagnosis  ---------------------------------------------------------------------------------------------------   Medications: Outpatient Medications Prior to Visit  Medication Sig   albuterol (VENTOLIN HFA) 108 (90 Base) MCG/ACT inhaler Inhale 2 puffs into the lungs every 6 (six) hours as needed for wheezing or shortness of breath.   allopurinol (ZYLOPRIM) 300 MG tablet TAKE 1  TABLET(300 MG) BY MOUTH DAILY   aspirin EC 81 MG tablet Take 81 mg by mouth daily. Swallow whole.   blood glucose meter kit and supplies Dispense based on patient and insurance preference. Use up to four times daily as directed. (FOR ICD-10 E10.9, E11.9). check blood glucose fasting and before meals four times daily.   Budeson-Glycopyrrol-Formoterol (BREZTRI AEROSPHERE) 160-9-4.8 MCG/ACT AERO Inhale 2 puffs into the lungs 2 (two) times daily.   Cholecalciferol (VITAMIN D3 PO) Take 4,000 Units by mouth.   Coenzyme Q10 (CO Q-10) 400 MG CAPS Take by mouth daily at 6 (six) AM.   desoximetasone (TOPICORT) 0.25 % cream Apply 1 application. topically 2 (two) times daily.   empagliflozin (JARDIANCE) 25 MG TABS tablet Take 1 tablet (25 mg total) by mouth daily before breakfast.   escitalopram (LEXAPRO) 20 MG tablet TAKE 1 TABLET(20 MG) BY MOUTH DAILY   fenofibrate (TRICOR) 145 MG tablet Take 1 tablet (145 mg total) by mouth daily.   gabapentin (NEURONTIN) 300 MG capsule Take 1 capsule (300 mg total) by mouth 2 (two) times daily AND 2 capsules (600 mg total) at bedtime.   glimepiride (AMARYL) 1 MG tablet Take 1 tablet (1 mg total) by mouth daily with breakfast.   HYDROcodone-acetaminophen (NORCO/VICODIN) 5-325 MG tablet Take 1 tablet by mouth every 6 (six) hours as needed for moderate pain.   lisinopril (ZESTRIL) 20 MG tablet Take 1 tablet (20 mg total) by mouth daily.   lovastatin (MEVACOR) 40 MG tablet TAKE 1 TABLET(40 MG) BY MOUTH DAILY   Omega-3 Fatty Acids (FISH OIL) 1200 MG CAPS Take by mouth daily at 6 (six) AM.   prasugrel (EFFIENT) 10 MG TABS tablet Take 1 tablet (10 mg total) by mouth daily.   torsemide (DEMADEX) 20 MG tablet Take 1 tablet (20 mg total) by mouth daily as needed.   traZODone (DESYREL) 150 MG tablet Take 1 tablet (150 mg total) by mouth at bedtime.   No facility-administered medications prior to visit.    Review of Systems  Constitutional:  Negative for appetite change and  fatigue.  Eyes:  Negative for visual disturbance.  Respiratory:  Positive for shortness of breath (chronic). Negative for chest tightness.   Cardiovascular:  Negative for chest pain, palpitations and leg swelling.        Objective    BP 133/64 (BP Location: Right Arm, Patient Position: Sitting, Cuff Size: Large)   Pulse 60   Temp 98.1 F (36.7 C) (Oral)   Resp 16   Wt 272 lb (123.4 kg)   SpO2 94%   BMI 39.03 kg/m  BP Readings from Last 3 Encounters:  09/23/21 133/64  09/03/21 138/80  08/06/21 (!) 186/83   Wt Readings from Last 3 Encounters:  09/23/21 272 lb (123.4 kg)  09/03/21 273 lb 3.2 oz (123.9 kg)  08/06/21 271 lb 11.2 oz (123.2 kg)      Physical Exam Vitals reviewed.  Constitutional:      General: He is not in acute distress.    Appearance: Normal appearance. He is not diaphoretic.  HENT:     Head: Normocephalic and atraumatic.     Nose: Nose normal.  Eyes:     General: No scleral icterus.    Extraocular Movements: Extraocular movements intact.     Conjunctiva/sclera: Conjunctivae normal.     Pupils: Pupils are equal, round, and reactive to light.  Cardiovascular:     Rate and Rhythm: Normal rate and regular rhythm.     Pulses: Normal pulses.     Heart sounds: Normal heart sounds. No murmur heard. Pulmonary:     Effort: Pulmonary effort is normal. No respiratory distress.     Breath sounds: Normal breath sounds. No wheezing or rhonchi.  Abdominal:     General: Abdomen is flat.     Palpations: Abdomen is soft.  Musculoskeletal:        General: Normal range of motion.     Cervical back: Normal range of motion and neck supple.     Right lower leg: No edema.     Left lower leg: No edema.  Lymphadenopathy:     Cervical: No cervical adenopathy.  Skin:    General: Skin is warm and dry.     Findings: No rash.  Neurological:     Mental Status: He is alert and oriented to person, place, and time. Mental status is at baseline.  Psychiatric:         Behavior: Behavior normal.        Thought Content: Thought content normal.        Judgment: Judgment normal.      No results found for any visits on 09/23/21.  Assessment & Plan     1. Controlled type 2 diabetes mellitus with complication, without long-term current use of insulin (HCC) POCT glucose 155 Continue to adhere to low-carb diet and regular exercise/swimming Pt loves water and swimming  Continue jardiance 25 mg and amaryl for 1 mo Measured BS at home daily Encouraged to have small portions and more frequent meals  Pt moved to this area a year ago. Pt promised to schedule an annual appt with an eye doctor.  2. Hypertension associated with diabetes ([place]) Chronic BP 133/64 but outside BP 140-160s Continue lisinopril 37m, torsemide 227mContinue low-salt diet and regular daily exercises/swimming  3. Chronic diastolic CHF (congestive heart failure) (HCC) Chronic and stable. Continue torsemide Managed by Cardiology  4. CKD stage 3b  Chronic. Micro/creat ratio 356 on 08/06/21 Continue lisinopril Continue to manage HTN, HLD, DMII Managed by Nephrology  5. Morbid obesity BMI 39 Lifestyle modifications are strongly encouraged.  6. HDL Chronic. Stable Continue statin  FU in 1 mo The patient was advised to call back or seek an in-person evaluation if the symptoms worsen or if the condition fails to improve as anticipated.  I discussed the assessment and treatment plan with the patient. The patient was provided an opportunity to ask questions and all were answered. The patient agreed with the plan and demonstrated an understanding of the instructions.  The entirety of the information documented in the History of Present Illness, Review of Systems and Physical Exam were personally obtained by me. Portions of this information were initially documented by the CMA and reviewed by me for thoroughness and accuracy.  Portions of this note were created using dictation  software and may contain typographical errors.      Total encounter time more than 30 minutes  Greater than 50% was spent in counseling and coordination of care with the patient  JaMardene SpeakPA-C  [place] [phone] (phone) [phone] (fax)  [place]

## 2021-09-23 ENCOUNTER — Encounter: Payer: Self-pay | Admitting: Physician Assistant

## 2021-09-23 ENCOUNTER — Ambulatory Visit (INDEPENDENT_AMBULATORY_CARE_PROVIDER_SITE_OTHER): Payer: Medicare Other | Admitting: Physician Assistant

## 2021-09-23 VITALS — BP 133/64 | HR 60 | Temp 98.1°F | Resp 16 | Wt 272.0 lb

## 2021-09-23 DIAGNOSIS — I5033 Acute on chronic diastolic (congestive) heart failure: Secondary | ICD-10-CM | POA: Diagnosis not present

## 2021-09-23 DIAGNOSIS — E118 Type 2 diabetes mellitus with unspecified complications: Secondary | ICD-10-CM

## 2021-09-23 DIAGNOSIS — N1832 Chronic kidney disease, stage 3b: Secondary | ICD-10-CM

## 2021-09-23 DIAGNOSIS — I152 Hypertension secondary to endocrine disorders: Secondary | ICD-10-CM

## 2021-09-23 DIAGNOSIS — E1159 Type 2 diabetes mellitus with other circulatory complications: Secondary | ICD-10-CM

## 2021-09-23 LAB — POCT CBG (FASTING - GLUCOSE)-MANUAL ENTRY: Glucose Fasting, POC: 155 mg/dL — AB (ref 70–99)

## 2021-09-24 ENCOUNTER — Telehealth: Payer: Self-pay | Admitting: Family Medicine

## 2021-09-24 NOTE — Telephone Encounter (Signed)
[facility] faxed refill request for the following medications:   albuterol (VENTOLIN HFA) 108 (90 Base) MCG/ACT inhaler   Please advise.  

## 2021-09-25 ENCOUNTER — Other Ambulatory Visit: Payer: Self-pay

## 2021-09-25 MED ORDER — ALBUTEROL SULFATE HFA 108 (90 BASE) MCG/ACT IN AERS: 2.0000 | INHALATION_SPRAY | Freq: Four times a day (QID) | RESPIRATORY_TRACT | 2 refills | Status: DC | PRN

## 2021-09-28 ENCOUNTER — Ambulatory Visit: Payer: Self-pay | Admitting: *Deleted

## 2021-09-28 ENCOUNTER — Other Ambulatory Visit: Payer: Self-pay

## 2021-09-28 ENCOUNTER — Other Ambulatory Visit: Payer: Self-pay | Admitting: Family Medicine

## 2021-09-28 MED ORDER — TORSEMIDE 20 MG PO TABS: 20.0000 mg | ORAL_TABLET | Freq: Every day | ORAL | 1 refills | Status: DC | PRN

## 2021-09-28 NOTE — Telephone Encounter (Signed)
Medication Refill - Medication: HYDROcodone-acetaminophen (NORCO/VICODIN) 5-325 MG tablet  Has the patient contacted their pharmacy? No. Pt previously told to contact provider  Preferred Pharmacy (with phone number or street name):  [id number] - [place], [place] - [address] Phone:  [phone]  Fax:  [phone]     Has the patient been seen for an appointment in the last year OR does the patient have an upcoming appointment? Yes.    Agent: Please be advised that RX refills may take up to 3 business days. We ask that you follow-up with your pharmacy.

## 2021-09-29 MED ORDER — HYDROCODONE-ACETAMINOPHEN 5-325 MG PO TABS: 1.0000 | ORAL_TABLET | Freq: Four times a day (QID) | ORAL | 0 refills | Status: DC | PRN

## 2021-09-29 NOTE — Telephone Encounter (Signed)
In error

## 2021-09-29 NOTE — Telephone Encounter (Signed)
Requested medication (s) are due for refill today: yes  Requested medication (s) are on the active medication list: yes  Last refill:  08/27/21 #30  Future visit scheduled: yes  Notes to clinic:  med not delegated to NT to RF   Requested Prescriptions  Pending Prescriptions Disp Refills   HYDROcodone-acetaminophen (NORCO/VICODIN) 5-325 MG tablet 30 tablet 0    Sig: Take 1 tablet by mouth every 6 (six) hours as needed for moderate pain.     Not Delegated - Analgesics:  Opioid Agonist Combinations Failed - 09/28/2021 11:43 AM      Failed - This refill cannot be delegated      Failed - Urine Drug Screen completed in last 360 days      Passed - Valid encounter within last 3 months    Recent Outpatient Visits           6 days ago Controlled type 2 diabetes mellitus with complication, without long-term current use of insulin ([place])   [facility] [place], [place], PA-C   1 week ago Chronic pain of both knees   [employer], [place], PA-C   1 month ago Sales executive for annual physical exam   [employer], Dionne Bucy, MD   5 months ago GAD (generalized anxiety disorder)   [facility] [place], Dionne Bucy, MD   5 months ago Otalgia, unspecified laterality   [employer], Dani Gobble, PA-C       Future Appointments             In 3 weeks Agbor-Etang, Aaron Edelman, MD [place], [place]   In 4 weeks [name], PA-C [employer], [place]   In 4 months [place], Dionne Bucy, MD [employer] [employer], [facility]

## 2021-10-08 ENCOUNTER — Other Ambulatory Visit: Payer: Self-pay | Admitting: Family Medicine

## 2021-10-08 ENCOUNTER — Ambulatory Visit: Payer: Medicare Other | Admitting: Physician Assistant

## 2021-10-12 ENCOUNTER — Other Ambulatory Visit: Payer: Self-pay | Admitting: Physician Assistant

## 2021-10-12 DIAGNOSIS — E118 Type 2 diabetes mellitus with unspecified complications: Secondary | ICD-10-CM

## 2021-10-14 ENCOUNTER — Other Ambulatory Visit: Payer: Self-pay | Admitting: Family Medicine

## 2021-10-14 DIAGNOSIS — F411 Generalized anxiety disorder: Secondary | ICD-10-CM

## 2021-10-15 NOTE — Telephone Encounter (Signed)
Requested Prescriptions  Pending Prescriptions Disp Refills  . escitalopram (LEXAPRO) 20 MG tablet [Pharmacy Med Name: ESCITALOPRAM '20MG'$  TABLETS] 90 tablet 0    Sig: TAKE 1 TABLET(20 MG) BY MOUTH DAILY     Psychiatry:  Antidepressants - SSRI Passed - 10/14/2021 10:09 AM      Passed - Valid encounter within last 6 months    Recent Outpatient Visits          3 weeks ago Controlled type 2 diabetes mellitus with complication, without long-term current use of insulin ([place])   [facility] [name], [place], PA-C   4 weeks ago Chronic pain of both knees   [employer], [place], PA-C   2 months ago Sales executive for annual physical exam   [employer], Dionne Bucy, MD   6 months ago GAD (generalized anxiety disorder)   [facility] [place], Dionne Bucy, MD   6 months ago Otalgia, unspecified laterality   [employer], Dani Gobble, PA-C      Future Appointments            In 1 week Agbor-Etang, Aaron Edelman, MD [facility], [place]   In 1 week [name], [place], [place]   In 3 months [place], Dionne Bucy, MD [employer], [place]

## 2021-10-16 DIAGNOSIS — G4733 Obstructive sleep apnea (adult) (pediatric): Secondary | ICD-10-CM | POA: Diagnosis not present

## 2021-10-23 ENCOUNTER — Other Ambulatory Visit: Payer: Self-pay | Admitting: Family Medicine

## 2021-10-23 NOTE — Telephone Encounter (Signed)
Requested Prescriptions  Pending Prescriptions Disp Refills  . allopurinol (ZYLOPRIM) 300 MG tablet [Pharmacy Med Name: ALLOPURINOL '300MG'$  TABLETS] 90 tablet 2    Sig: TAKE 1 TABLET(300 MG) BY MOUTH DAILY     Endocrinology:  Gout Agents - allopurinol Failed - 10/23/2021 10:09 AM      Failed - Uric Acid in normal range and within 360 days    Uric Acid  Date Value Ref Range Status  07/21/2020 6.9 3.8 - 8.4 mg/dL Final    Comment:               Therapeutic target for gout patients: <6.0         Failed - Cr in normal range and within 360 days    Creatinine, Ser  Date Value Ref Range Status  08/06/2021 2.05 (H) 0.76 - 1.27 mg/dL Final         Passed - Valid encounter within last 12 months    Recent Outpatient Visits          1 month ago Controlled type 2 diabetes mellitus with complication, without long-term current use of insulin ([place])   [employer], [place], PA-C   1 month ago Chronic pain of both knees   [employer], [place], PA-C   2 months ago Sales executive for annual physical exam   [employer], Dionne Bucy, MD   6 months ago GAD (generalized anxiety disorder)   [employer], Dionne Bucy, MD   6 months ago Otalgia, unspecified laterality   [employer], Dani Gobble, PA-C      Future Appointments            In 3 days Agbor-Etang, Aaron Edelman, MD [employer], [facility]   In 4 days [place], [place], PA-C [employer], [place]   In 3 months Bacigal[place], Dionne Bucy, MD [employer], [facility]           Passed - CBC within normal limits and completed in the last 12 months    WBC  Date Value Ref Range Status  03/11/2021 11.9 (H) 4.0 - 10.5 K/uL Final   RBC  Date Value Ref Range Status  03/11/2021 5.13 4.22 - 5.81 MIL/uL Final   Hemoglobin  Date Value Ref Range Status  03/11/2021 15.7 13.0 - 17.0 g/dL Final  04/22/2020 15.6 13.0 - 17.7 g/dL Final    HCT  Date Value Ref Range Status  03/11/2021 46.5 39.0 - 52.0 % Final   Hematocrit  Date Value Ref Range Status  04/22/2020 46.9 37.5 - 51.0 % Final   MCHC  Date Value Ref Range Status  03/11/2021 33.8 30.0 - 36.0 g/dL Final   [facility]  Date Value Ref Range Status  03/11/2021 30.6 26.0 - 34.0 pg Final   MCV  Date Value Ref Range Status  03/11/2021 90.6 80.0 - 100.0 fL Final  04/22/2020 90 79 - 97 fL Final   No results found for: "PLTCOUNTKUC", "LABPLAT", "POCPLA" RDW  Date Value Ref Range Status  03/11/2021 14.9 11.5 - 15.5 % Final  04/22/2020 14.4 11.6 - 15.4 % Final

## 2021-10-26 ENCOUNTER — Encounter: Payer: Self-pay | Admitting: Cardiology

## 2021-10-26 ENCOUNTER — Ambulatory Visit: Payer: Medicare Other | Admitting: Cardiology

## 2021-10-26 ENCOUNTER — Ambulatory Visit: Payer: Medicare Other | Admitting: Physician Assistant

## 2021-10-26 VITALS — BP 148/70 | HR 60 | Ht 70.0 in | Wt 278.0 lb

## 2021-10-26 DIAGNOSIS — I10 Essential (primary) hypertension: Secondary | ICD-10-CM

## 2021-10-26 DIAGNOSIS — I2510 Atherosclerotic heart disease of native coronary artery without angina pectoris: Secondary | ICD-10-CM

## 2021-10-26 DIAGNOSIS — E7800 Pure hypercholesterolemia, unspecified: Secondary | ICD-10-CM | POA: Diagnosis not present

## 2021-10-26 MED ORDER — LISINOPRIL 20 MG PO TABS: 30.0000 mg | ORAL_TABLET | Freq: Every day | ORAL | 1 refills | Status: DC

## 2021-10-26 MED ORDER — NITROGLYCERIN 0.4 MG SL SUBL: 0.4000 mg | SUBLINGUAL_TABLET | SUBLINGUAL | 1 refills | Status: AC | PRN

## 2021-10-26 NOTE — Progress Notes (Signed)
Cardiology Office Note:    Date:  10/26/2021   ID:  Tony Williams, DOB 01-Sep-1945, MRN [id number]  PCP:  Virginia Crews, [place]  Cardiologist:  Kate Sable, MD  Advanced Practice Provider:  No care team member to display Electrophysiologist:  None       Referring MD: Virginia Crews, MD   Chief Complaint  Patient presents with   Follow-up    6 month F/U-Patient reports getting high blood pressure readings "most of the time".     History of Present Illness:    Tony Williams is a 76 y.o. male with a hx of CAD/CABG x 4 2007, PCI x 2 2018, PCI x 1 SVG-LAD/Diagonal 2022, former smoker,  COPD, OSA, obesity diabetes who presents for follow-up.  Patient being seen for CAD, hyperlipidemia.  Recent cholesterol lab work-up normal, fenofibrate started.  Patient not tolerant to several statins, Zetia.  Tolerating lovastatin, fenofibrate as prescribed.  Has chronic shortness of breath due to COPD, pulmonary scarring.  Previously switched from Brilinta to prasugrel due to cost.  Has no bleeding issues with prasugrel.  Feels well, feeling a little down due to son recently diagnosed with metastatic gastric cancer, but otherwise holding up okay.   Prior notes Limited echo 11/2020 EF 60 to 65%, indeterminate diastolic function Echo 11/9240 normal systolic function, indeterminate diastolic function, EF 60 to 65%. Lexiscan Myoview, fixed perfusion defect, no evidence for ischemia. Patient recently moved to the area from [facility].  States having CAD/CABG x4 back in 2007, in 2018 had a myocardial infarction, had 2 stents placed.  He is intolerant to statins, has tried multiple statins and not able to tolerate due to myalgias.  Not tolerant to Zetia.  Currently able to tolerate low-dose lovastatin.    Had sleep study in the past and was diagnosed with OSA, he declines using his CPAP mask.  Does not like the feel for the mask.    Past  Medical History:  Diagnosis Date   CAD S/P percutaneous coronary angioplasty    CAD, multiple vessel    Cancer ([place]) 2013   Bladder   Chronic kidney disease    COPD (chronic obstructive pulmonary disease) (HCC)    Depression    Diabetes mellitus without complication (HCC)    Heart problem    Hx of CABG x 4    LIMA-D2, SVG-OM (occluded), SeqSVG-D1-LAD.   Myocardial infarction [facility])     Past Surgical History:  Procedure Laterality Date   CHOLECYSTECTOMY, LAPAROSCOPIC  12/26/1995   COLONOSCOPY WITH PROPOFOL N/A 09/23/2020   Procedure: COLONOSCOPY WITH PROPOFOL;  Surgeon: Lucilla Lame, MD;  Location: [facility] ENDOSCOPY;  Service: Endoscopy;  Laterality: N/A;   CORONARY ARTERY BYPASS GRAFT     LIMA-D2, SVG-OM, SeqSVG-D1-LAD   CORONARY STENT INTERVENTION N/A 11/12/2020   Procedure: CORONARY STENT INTERVENTION;  Surgeon: Wellington Hampshire, MD;  Location: [place] CV LAB;  Service: Cardiovascular;  Laterality: N/A;   EYE SURGERY     IABP INSERTION N/A 11/11/2020   Procedure: IABP Insertion;  Surgeon: Nelva Bush, MD;  Location: [place] CV LAB;  Service: Cardiovascular;  Laterality: N/A;   IR STENT PLACEMENT ANTE CAROTID INC ANGIO     KNEE SURGERY     RIGHT/LEFT HEART CATH AND CORONARY/GRAFT ANGIOGRAPHY N/A 11/11/2020   Procedure: RIGHT/LEFT HEART CATH AND CORONARY/GRAFT ANGIOGRAPHY;  Surgeon: Nelva Bush, MD;  Location: [place] CV LAB;  Service: Cardiovascular;  Severe native CAD: 90% dLMCA-100%  mLAD/100% ost LCx CTO w/ Severe diffuse RCA disease. Widely patent LIMA-D1. Patent sequential SVG-D2-LAD with 99% mid graft & 90% LIMA-D2 @ anastomosis. previously stented SVG-OM - CTO. Mildly   TRANSURETHRAL RESECTION OF PROSTATE  1994   URINARY SPHINCTER IMPLANT  1995    Current Medications: Current Meds  Medication Sig   albuterol (VENTOLIN HFA) 108 (90 Base) MCG/ACT inhaler Inhale 2 puffs into the lungs every 6 (six) hours as needed for wheezing or shortness of breath.    allopurinol (ZYLOPRIM) 300 MG tablet TAKE 1 TABLET(300 MG) BY MOUTH DAILY   aspirin EC 81 MG tablet Take 81 mg by mouth daily. Swallow whole.   blood glucose meter kit and supplies Dispense based on patient and insurance preference. Use up to four times daily as directed. (FOR ICD-10 E10.9, E11.9). check blood glucose fasting and before meals four times daily.   Budeson-Glycopyrrol-Formoterol (BREZTRI AEROSPHERE) 160-9-4.8 MCG/ACT AERO Inhale 2 puffs into the lungs 2 (two) times daily.   Cholecalciferol (VITAMIN D3 PO) Take 4,000 Units by mouth.   Coenzyme Q10 (CO Q-10) 400 MG CAPS Take by mouth daily at 6 (six) AM.   desoximetasone (TOPICORT) 0.25 % cream Apply 1 application. topically 2 (two) times daily.   empagliflozin (JARDIANCE) 25 MG TABS tablet Take 1 tablet (25 mg total) by mouth daily before breakfast.   escitalopram (LEXAPRO) 20 MG tablet TAKE 1 TABLET(20 MG) BY MOUTH DAILY   fenofibrate (TRICOR) 145 MG tablet Take 1 tablet (145 mg total) by mouth daily.   gabapentin (NEURONTIN) 300 MG capsule Take 1 capsule (300 mg total) by mouth 2 (two) times daily AND 2 capsules (600 mg total) at bedtime.   glimepiride (AMARYL) 1 MG tablet TAKE 1 TABLET(1 MG) BY MOUTH DAILY WITH BREAKFAST   HYDROcodone-acetaminophen (NORCO/VICODIN) 5-325 MG tablet Take 1 tablet by mouth every 6 (six) hours as needed for moderate pain.   lovastatin (MEVACOR) 40 MG tablet TAKE 1 TABLET(40 MG) BY MOUTH DAILY   nitroGLYCERIN (NITROSTAT) 0.4 MG SL tablet Place 1 tablet (0.4 mg total) under the tongue every 5 (five) minutes as needed for chest pain.   Omega-3 Fatty Acids (FISH OIL) 1200 MG CAPS Take by mouth daily at 6 (six) AM.   prasugrel (EFFIENT) 10 MG TABS tablet Take 1 tablet (10 mg total) by mouth daily.   torsemide (DEMADEX) 20 MG tablet Take 1 tablet (20 mg total) by mouth daily as needed.   traZODone (DESYREL) 150 MG tablet Take 1 tablet (150 mg total) by mouth at bedtime.   [DISCONTINUED] lisinopril  (ZESTRIL) 20 MG tablet Take 1 tablet (20 mg total) by mouth daily.     Allergies:   Butorphanol, Morphine, Pedi-pre tape spray [wound dressing adhesive], Simvastatin, and Statins   Social History   Socioeconomic History   Marital status: Married    Spouse name: Not on file   Number of children: 2   Years of education: Not on file   Highest education level: High school graduate  Occupational History   Occupation: retired  Tobacco Use   Smoking status: Former    Packs/day: 1.00    Years: 30.00    Total pack years: 30.00    Types: Cigarettes    Quit date: 10/23/2003    Years since quitting: 18.0   Smokeless tobacco: Never  Vaping Use   Vaping Use: Never used  Substance and Sexual Activity   Alcohol use: Never   Drug use: Never   Sexual activity: Not on file  Other Topics Concern   Not on file  Social History Narrative   Not on file   Social Determinants of Health   Financial Resource Strain: Low Risk  (05/19/2021)   Overall Financial Resource Strain (CARDIA)    Difficulty of Paying Living Expenses: Not hard at all  Food Insecurity: No Food Insecurity (05/19/2021)   Hunger Vital Sign    Worried About Running Out of Food in the Last Year: Never true    Ran Out of Food in the Last Year: Never true  Transportation Needs: No Transportation Needs (05/19/2021)   PRAPARE - Hydrologist (Medical): No    Lack of Transportation (Non-Medical): No  Physical Activity: Inactive (05/19/2021)   Exercise Vital Sign    Days of Exercise per Week: 0 days    Minutes of Exercise per Session: 0 min  Stress: No Stress Concern Present (05/19/2021)   [place]    Feeling of Stress : Only a little  Recent Concern: Stress - Stress Concern Present (02/26/2021)   [place]    Feeling of Stress : To some extent  Social Connections: Moderately  Integrated (05/19/2021)   Social Connection and Isolation Panel [NHANES]    Frequency of Communication with Friends and Family: More than three times a week    Frequency of Social Gatherings with Friends and Family: Twice a week    Attends Religious Services: More than 4 times per year    Active Member of [employer] or Organizations: No    Attends Archivist Meetings: Never    Marital Status: Married     Family History: The patient's family history includes Heart Problems in his father and mother.  ROS:   Please see the history of present illness.     All other systems reviewed and are negative.  EKGs/Labs/Other Studies Reviewed:    The following studies were reviewed today:   EKG:  EKG is  ordered today.  EKG shows sinus rhythm, heart rate 65  Recent Labs: 12/08/2020: Magnesium 2.4; TSH 2.515 01/26/2021: B Natriuretic Peptide 372.9 03/11/2021: Hemoglobin 15.7; Platelets 269 08/06/2021: ALT 12; BUN 34; Creatinine, Ser 2.05; Potassium 4.9; Sodium 136 09/17/2021: NT-Pro BNP 715  Recent Lipid Panel    Component Value Date/Time   CHOL 200 07/22/2021 0940   CHOL 156 07/21/2020 1440   TRIG 384 (H) 07/22/2021 0940   HDL 33 (L) 07/22/2021 0940   HDL 34 (L) 07/21/2020 1440   CHOLHDL 6.1 07/22/2021 0940   VLDL 77 (H) 07/22/2021 0940   LDLCALC 90 07/22/2021 0940   LDLCALC 84 07/21/2020 1440     Risk Assessment/Calculations:      Physical Exam:    VS:  BP (!) 148/70 (BP Location: Left Arm, Patient Position: Sitting, Cuff Size: Large)   Pulse 60   Ht 5' 10" (1.778 m)   Wt 278 lb (126.1 kg)   SpO2 92%   BMI 39.89 kg/m     Wt Readings from Last 3 Encounters:  10/26/21 278 lb (126.1 kg)  09/23/21 272 lb (123.4 kg)  09/03/21 273 lb 3.2 oz (123.9 kg)     GEN:  Well nourished, well developed in no acute distress HEENT: Normal NECK: No JVD; No carotid bruits CARDIAC: RRR, no murmurs, rubs, gallops RESPIRATORY: Diminished breath sounds bilaterally, worse on the right  lung ABDOMEN: Soft, non-tender, distended MUSCULOSKELETAL:  No edema; No deformity  SKIN:  Warm and dry NEUROLOGIC:  Alert and oriented x 3 PSYCHIATRIC:  Normal affect   ASSESSMENT:    1. Coronary artery disease involving native coronary artery of native heart, unspecified whether angina present   2. Primary hypertension   3. Pure hypercholesterolemia    PLAN:    In order of problems listed above:  CAD, CABG x4, PCI x2 (2018).  Recent PCI x2 to SVG 10/2020.  Denies chest pain, continue aspirin, Effient, lovastatin.  Last echo with EF 60%.   Hypertension, BP slightly elevated.  Continue lisinopril 20 mg daily.  Reasonable to let blood pressure run a little higher due to previous history of orthostatic hypotension. Hyperlipidemia, total cholesterol and LDL at goal.  Continue lovastatin 40 mg daily, fenofibrate.  Repeat lipid panel in 3 months.  Not tolerant to Zetia, other statins or higher doses of lovastatin,.   Follow-up in 6-12 months.   Medication Adjustments/Labs and Tests Ordered: Current medicines are reviewed at length with the patient today.  Concerns regarding medicines are outlined above.  Orders Placed This Encounter  Procedures   Lipid panel   EKG 12-Lead       Meds ordered this encounter  Medications   nitroGLYCERIN (NITROSTAT) 0.4 MG SL tablet    Sig: Place 1 tablet (0.4 mg total) under the tongue every 5 (five) minutes as needed for chest pain.    Dispense:  25 tablet    Refill:  1   lisinopril (ZESTRIL) 20 MG tablet    Sig: Take 1.5 tablets (30 mg total) by mouth daily.    Dispense:  90 tablet    Refill:  1       Patient Instructions  Medication Instructions:  INCREASE lisinopril 20 mg-take 1.5 tablets daily.  A refill of Nitroglycerin has been sent to your pharmacy.  *If you need a refill on your cardiac medications before your next appointment, please call your pharmacy*  Lab Work: Please have a fasting lipid panel drawn at the [facility] in 3 months. You will check in at the front desk to the right as you walk into the atrium. Valet Parking is offered if needed. - No appointment needed. You may go any day between 7 am and 6 pm.   If you have labs (blood work) drawn today and your tests are completely normal, you will receive your results only by: [place] (if you have MyChart) OR A paper copy in the mail If you have any lab test that is abnormal or we need to change your treatment, we will call you to review the results.   Testing/Procedures: None ordered  Follow-Up: At [facility], you and your health needs are our priority.  As part of our continuing mission to provide you with exceptional heart care, we have created designated Provider Care Teams.  These Care Teams include your primary Cardiologist (physician) and Advanced Practice Providers (APPs -  Physician Assistants and Nurse Practitioners) who all work together to provide you with the care you need, when you need it.  We recommend signing up for the patient portal called "MyChart".  Sign up information is provided on this After Visit Summary.  MyChart is used to connect with patients for Virtual Visits (Telemedicine).  Patients are able to view lab/test results, encounter notes, upcoming appointments, etc.  Non-urgent messages can be sent to your provider as well.   To learn more about what you can do with MyChart, go to [email or web address].    Your next  appointment:   6 month(s)  The format for your next appointment:   In Person  Provider:   You may see Kate Sable, MD or one of the following Advanced Practice Providers on your designated Care Team:   Murray Hodgkins, NP Christell Faith, PA-C Cadence Kathlen Mody, [place]  Important Information About Sugar         Signed, Kate Sable, MD  10/26/2021 3:03 PM    [place]

## 2021-10-26 NOTE — Patient Instructions (Addendum)
Medication Instructions:  INCREASE lisinopril 20 mg-take 1.5 tablets daily.  A refill of Nitroglycerin has been sent to your pharmacy.  *If you need a refill on your cardiac medications before your next appointment, please call your pharmacy*  Lab Work: Please have a fasting lipid panel drawn at the [facility] in 3 months. You will check in at the front desk to the right as you walk into the atrium. Valet Parking is offered if needed. - No appointment needed. You may go any day between 7 am and 6 pm.   If you have labs (blood work) drawn today and your tests are completely normal, you will receive your results only by: [place] (if you have MyChart) OR A paper copy in the mail If you have any lab test that is abnormal or we need to change your treatment, we will call you to review the results.   Testing/Procedures: None ordered  Follow-Up: At [facility], you and your health needs are our priority.  As part of our continuing mission to provide you with exceptional heart care, we have created designated Provider Care Teams.  These Care Teams include your primary Cardiologist (physician) and Advanced Practice Providers (APPs -  Physician Assistants and Nurse Practitioners) who all work together to provide you with the care you need, when you need it.  We recommend signing up for the patient portal called "MyChart".  Sign up information is provided on this After Visit Summary.  MyChart is used to connect with patients for Virtual Visits (Telemedicine).  Patients are able to view lab/test results, encounter notes, upcoming appointments, etc.  Non-urgent messages can be sent to your provider as well.   To learn more about what you can do with MyChart, go to [email or web address].    Your next appointment:   6 month(s)  The format for your next appointment:   In Person  Provider:   You may see Kate Sable, MD or one of the following Advanced Practice Providers on  your designated Care Team:   Murray Hodgkins, NP Christell Faith, PA-C Cadence Kathlen Mody, PA-C{  Important Information About Sugar

## 2021-10-27 ENCOUNTER — Ambulatory Visit (INDEPENDENT_AMBULATORY_CARE_PROVIDER_SITE_OTHER): Payer: Medicare Other | Admitting: Physician Assistant

## 2021-10-27 ENCOUNTER — Encounter: Payer: Self-pay | Admitting: Physician Assistant

## 2021-10-27 VITALS — BP 152/98 | HR 66 | Temp 97.9°F | Resp 16 | Wt 276.3 lb

## 2021-10-27 DIAGNOSIS — E1169 Type 2 diabetes mellitus with other specified complication: Secondary | ICD-10-CM

## 2021-10-27 DIAGNOSIS — E1142 Type 2 diabetes mellitus with diabetic polyneuropathy: Secondary | ICD-10-CM

## 2021-10-27 DIAGNOSIS — N1832 Chronic kidney disease, stage 3b: Secondary | ICD-10-CM | POA: Diagnosis not present

## 2021-10-27 DIAGNOSIS — E1159 Type 2 diabetes mellitus with other circulatory complications: Secondary | ICD-10-CM

## 2021-10-27 DIAGNOSIS — E785 Hyperlipidemia, unspecified: Secondary | ICD-10-CM

## 2021-10-27 DIAGNOSIS — E118 Type 2 diabetes mellitus with unspecified complications: Secondary | ICD-10-CM

## 2021-10-27 DIAGNOSIS — I152 Hypertension secondary to endocrine disorders: Secondary | ICD-10-CM

## 2021-10-27 LAB — POCT GLYCOSYLATED HEMOGLOBIN (HGB A1C): Hemoglobin A1C: 7.7 % — AB (ref 4.0–5.6)

## 2021-10-27 NOTE — Progress Notes (Unsigned)
I,Eureka Valdes Robinson,acting as a Education administrator for [employer], PA-C.,have documented all relevant documentation on the behalf of [name], PA-C,as directed by  [employer], PA-C while in the presence of [employer], PA-C.   Established patient visit   Patient: Tony Williams   DOB: May 27, 1945   76 y.o. Male  MRN: [id number] Visit Date: 10/27/2021  Today's healthcare provider: [name], PA-C   Chief Complaint  Patient presents with   Diabetes   Subjective    Diabetes Mellitus Type II, Follow-up  Lab Results  Component Value Date   HGBA1C 7.7 (A) 10/27/2021   HGBA1C 8.5 (H) 08/06/2021   HGBA1C 6.9 (A) 12/29/2020   Wt Readings from Last 3 Encounters:  10/27/21 276 lb 4.8 oz (125.3 kg)  10/26/21 278 lb (126.1 kg)  09/23/21 272 lb (123.4 kg)   Last seen for diabetes 1 months ago.  Management since then includes: Continue jardiance 25 mg and amaryl for 1 mo. Measured BS at home daily Encouraged to have small portions and more frequent meals   He reports good compliance with treatment. He is not having side effects.  Symptoms: No fatigue No foot ulcerations  No appetite changes No nausea  No paresthesia of the feet  No polydipsia  No polyuria No visual disturbances   No vomiting     Home blood sugar records: fasting range: average 160  Episodes of hypoglycemia? No    Current insulin regiment: none  Most Recent Eye Exam: will make appt  Current exercise: jogs in pool Current diet habits: in general, an "unhealthy" diet  Pertinent Labs: Lab Results  Component Value Date   CHOL 200 07/22/2021   HDL 33 (L) 07/22/2021   LDLCALC 90 07/22/2021   TRIG 384 (H) 07/22/2021   CHOLHDL 6.1 07/22/2021   Lab Results  Component Value Date   NA 139 10/28/2021   K 4.9 10/28/2021   CREATININE 2.79 (H) 10/28/2021   EGFR 33 (L) 08/06/2021   MICROALBUR 100 07/21/2020   LABMICR 266.8 08/06/2021      ---------------------------------------------------------------------------------------------------   Medications: No facility-administered medications prior to visit.   Outpatient Medications Prior to Visit  Medication Sig Note   albuterol (VENTOLIN HFA) 108 (90 Base) MCG/ACT inhaler Inhale 2 puffs into the lungs every 6 (six) hours as needed for wheezing or shortness of breath.    allopurinol (ZYLOPRIM) 300 MG tablet TAKE 1 TABLET(300 MG) BY MOUTH DAILY    aspirin EC 81 MG tablet Take 81 mg by mouth daily. Swallow whole.    blood glucose meter kit and supplies Dispense based on patient and insurance preference. Use up to four times daily as directed. (FOR ICD-10 E10.9, E11.9). check blood glucose fasting and before meals four times daily.    Budeson-Glycopyrrol-Formoterol (BREZTRI AEROSPHERE) 160-9-4.8 MCG/ACT AERO Inhale 2 puffs into the lungs 2 (two) times daily.    Cholecalciferol (VITAMIN D3 PO) Take 4,000 Units by mouth.    Coenzyme Q10 (CO Q-10) 400 MG CAPS Take by mouth daily at 6 (six) AM.    desoximetasone (TOPICORT) 0.25 % cream Apply 1 application. topically 2 (two) times daily.    empagliflozin (JARDIANCE) 25 MG TABS tablet Take 1 tablet (25 mg total) by mouth daily before breakfast.    escitalopram (LEXAPRO) 20 MG tablet TAKE 1 TABLET(20 MG) BY MOUTH DAILY    fenofibrate (TRICOR) 145 MG tablet Take 1 tablet (145 mg total) by mouth daily.    gabapentin (NEURONTIN) 300 MG capsule Take 1 capsule (  300 mg total) by mouth 2 (two) times daily AND 2 capsules (600 mg total) at bedtime.    glimepiride (AMARYL) 1 MG tablet TAKE 1 TABLET(1 MG) BY MOUTH DAILY WITH BREAKFAST    HYDROcodone-acetaminophen (NORCO/VICODIN) 5-325 MG tablet Take 1 tablet by mouth every 6 (six) hours as needed for moderate pain.    lisinopril (ZESTRIL) 20 MG tablet Take 1.5 tablets (30 mg total) by mouth daily. 10/27/2021: Per cardiologist pt to take 30 mg daily starting 10-26-21   lovastatin (MEVACOR) 40 MG  tablet TAKE 1 TABLET(40 MG) BY MOUTH DAILY    nitroGLYCERIN (NITROSTAT) 0.4 MG SL tablet Place 1 tablet (0.4 mg total) under the tongue every 5 (five) minutes as needed for chest pain.    Omega-3 Fatty Acids (FISH OIL) 1200 MG CAPS Take by mouth daily at 6 (six) AM.    prasugrel (EFFIENT) 10 MG TABS tablet Take 1 tablet (10 mg total) by mouth daily.    torsemide (DEMADEX) 20 MG tablet Take 1 tablet (20 mg total) by mouth daily as needed.    traZODone (DESYREL) 150 MG tablet Take 1 tablet (150 mg total) by mouth at bedtime.     Review of Systems  Respiratory: Negative.    Cardiovascular: Negative.   Gastrointestinal: Negative.        Objective     Vitals:   10/27/21 1351 10/27/21 1358  BP: (!) 168/82 (!) 152/98  Pulse: 66   Resp: 16   Temp: 97.9 F (36.6 C)   SpO2: 94%      Physical Exam Vitals reviewed.  Constitutional:      General: He is not in acute distress.    Appearance: Normal appearance. He is not diaphoretic.  HENT:     Head: Normocephalic and atraumatic.     Nose: Nose normal.  Eyes:     General: No scleral icterus.    Extraocular Movements: Extraocular movements intact.     Conjunctiva/sclera: Conjunctivae normal.  Cardiovascular:     Rate and Rhythm: Normal rate and regular rhythm.     Pulses: Normal pulses.     Heart sounds: Normal heart sounds. No murmur heard. Pulmonary:     Effort: Pulmonary effort is normal. No respiratory distress.     Breath sounds: Normal breath sounds. No wheezing or rhonchi.  Musculoskeletal:     Cervical back: Neck supple.     Right lower leg: No edema.     Left lower leg: No edema.  Lymphadenopathy:     Cervical: No cervical adenopathy.  Skin:    General: Skin is warm and dry.     Findings: No rash.  Neurological:     Mental Status: He is alert and oriented to person, place, and time. Mental status is at baseline.  Psychiatric:        Mood and Affect: Mood normal.        Behavior: Behavior normal.        Thought  Content: Thought content normal.        Judgment: Judgment normal.       Results for orders placed or performed in visit on 10/27/21  POCT glycosylated hemoglobin (Hb A1C)  Result Value Ref Range   Hemoglobin A1C 7.7 (A) 4.0 - 5.6 %    Assessment & Plan     1. Controlled type 2 diabetes mellitus with complication, without long-term current use of insulin (HCC) Improving - POCT glycosylated hemoglobin (Hb A1C) 7.7 Continue to take Amaryl for an another month?  Risks/hypoglycemia were discussed.  2. Hypertension associated with diabetes ([place]) Bp 152/98 first reading  Reports no sob, chest pain palpitations Mostly due to stress withson's recent diagnosis of metastatic stomach cancer. Pt reassured that he was seen by Cardiology recently , on 8/14 and his lisinopril was increased to 85m EKG was reassuring. BP was elevated. Relaxation techniques were discussed. Advised to measure his BP BID and it will be elevated to come back or proceed to UC Advised pt to go to ED/UC if he developed chest pain or sob or palpitations Encouraged to take care of his own health.  3. Stage 3b chronic kidney disease (CKD) ([place] Reviewed labs results and reassured patient that his renal functions have been stable. He needs to drink 8-12 glasses of water every day, avoid NSAIDs, and have renal functions check avery 6-12 months to ensure stability.       4. Morbid obesity ([place] Encourage to continue healthy diet and daily exercise  5. Diabetic polyneuropathy associated with type 2 diabetes mellitus (HCC) Chronic and stable Continue current regimen Continue low-carb diet and daily exercise  6. Hyperlipidemia associated with type 2 diabetes mellitus (HCC) Chronic and stable Continue current regimen, healthy diet/low cholesterol diet and daily exercise  7. Ophthalmology exam Encouraged to see ophthalmology for an annual eye exam   FU as scheduled     The patient was advised to call back or seek  an in-person evaluation if the symptoms worsen or if the condition fails to improve as anticipated.  I discussed the assessment and treatment plan with the patient. The patient was provided an opportunity to ask questions and all were answered. The patient agreed with the plan and demonstrated an understanding of the instructions.  The entirety of the information documented in the History of Present Illness, Review of Systems and Physical Exam were personally obtained by me. Portions of this information were initially documented by the CMA and reviewed by me for thoroughness and accuracy.  Portions of this note were created using dictation software and may contain typographical errors.        Total encounter time more than 30 minutes  Greater than 50% was spent in counseling and coordination of care with the patient  JElberta Leatherwood B[phone](phone) [phone](fax)  [place]

## 2021-10-28 ENCOUNTER — Encounter: Payer: Self-pay | Admitting: Emergency Medicine

## 2021-10-28 ENCOUNTER — Inpatient Hospital Stay
Admission: EM | Admit: 2021-10-28 | Discharge: 2021-10-31 | DRG: 302 | Disposition: A | Discharge status: Home / Self Care | Payer: Medicare Other | Attending: Obstetrics and Gynecology | Admitting: Obstetrics and Gynecology

## 2021-10-28 ENCOUNTER — Emergency Department: Payer: Medicare Other

## 2021-10-28 DIAGNOSIS — R001 Bradycardia, unspecified: Secondary | ICD-10-CM | POA: Diagnosis present

## 2021-10-28 DIAGNOSIS — Z87891 Personal history of nicotine dependence: Secondary | ICD-10-CM | POA: Diagnosis not present

## 2021-10-28 DIAGNOSIS — I951 Orthostatic hypotension: Secondary | ICD-10-CM | POA: Diagnosis present

## 2021-10-28 DIAGNOSIS — I441 Atrioventricular block, second degree: Secondary | ICD-10-CM | POA: Diagnosis present

## 2021-10-28 DIAGNOSIS — J449 Chronic obstructive pulmonary disease, unspecified: Secondary | ICD-10-CM | POA: Diagnosis present

## 2021-10-28 DIAGNOSIS — E1142 Type 2 diabetes mellitus with diabetic polyneuropathy: Secondary | ICD-10-CM | POA: Diagnosis present

## 2021-10-28 DIAGNOSIS — R918 Other nonspecific abnormal finding of lung field: Secondary | ICD-10-CM | POA: Diagnosis not present

## 2021-10-28 DIAGNOSIS — M542 Cervicalgia: Secondary | ICD-10-CM | POA: Diagnosis present

## 2021-10-28 DIAGNOSIS — E860 Dehydration: Secondary | ICD-10-CM | POA: Diagnosis present

## 2021-10-28 DIAGNOSIS — G4733 Obstructive sleep apnea (adult) (pediatric): Secondary | ICD-10-CM | POA: Diagnosis not present

## 2021-10-28 DIAGNOSIS — Z6839 Body mass index (BMI) 39.0-39.9, adult: Secondary | ICD-10-CM | POA: Diagnosis not present

## 2021-10-28 DIAGNOSIS — N1832 Chronic kidney disease, stage 3b: Secondary | ICD-10-CM | POA: Diagnosis not present

## 2021-10-28 DIAGNOSIS — I25110 Atherosclerotic heart disease of native coronary artery with unstable angina pectoris: Principal | ICD-10-CM | POA: Diagnosis present

## 2021-10-28 DIAGNOSIS — G8929 Other chronic pain: Secondary | ICD-10-CM | POA: Diagnosis present

## 2021-10-28 DIAGNOSIS — J9611 Chronic respiratory failure with hypoxia: Secondary | ICD-10-CM

## 2021-10-28 DIAGNOSIS — R0602 Shortness of breath: Secondary | ICD-10-CM | POA: Diagnosis not present

## 2021-10-28 DIAGNOSIS — E1122 Type 2 diabetes mellitus with diabetic chronic kidney disease: Secondary | ICD-10-CM | POA: Diagnosis present

## 2021-10-28 DIAGNOSIS — Z951 Presence of aortocoronary bypass graft: Secondary | ICD-10-CM

## 2021-10-28 DIAGNOSIS — I161 Hypertensive emergency: Secondary | ICD-10-CM | POA: Diagnosis present

## 2021-10-28 DIAGNOSIS — I200 Unstable angina: Secondary | ICD-10-CM | POA: Diagnosis not present

## 2021-10-28 DIAGNOSIS — N179 Acute kidney failure, unspecified: Secondary | ICD-10-CM | POA: Diagnosis not present

## 2021-10-28 DIAGNOSIS — D649 Anemia, unspecified: Secondary | ICD-10-CM

## 2021-10-28 DIAGNOSIS — I5033 Acute on chronic diastolic (congestive) heart failure: Secondary | ICD-10-CM | POA: Diagnosis not present

## 2021-10-28 DIAGNOSIS — R519 Headache, unspecified: Secondary | ICD-10-CM | POA: Diagnosis present

## 2021-10-28 DIAGNOSIS — I25118 Atherosclerotic heart disease of native coronary artery with other forms of angina pectoris: Secondary | ICD-10-CM | POA: Diagnosis not present

## 2021-10-28 DIAGNOSIS — N183 Chronic kidney disease, stage 3 unspecified: Secondary | ICD-10-CM | POA: Diagnosis not present

## 2021-10-28 DIAGNOSIS — E669 Obesity, unspecified: Secondary | ICD-10-CM | POA: Diagnosis not present

## 2021-10-28 DIAGNOSIS — N170 Acute kidney failure with tubular necrosis: Secondary | ICD-10-CM | POA: Diagnosis not present

## 2021-10-28 DIAGNOSIS — I130 Hypertensive heart and chronic kidney disease with heart failure and stage 1 through stage 4 chronic kidney disease, or unspecified chronic kidney disease: Secondary | ICD-10-CM | POA: Diagnosis not present

## 2021-10-28 DIAGNOSIS — K922 Gastrointestinal hemorrhage, unspecified: Principal | ICD-10-CM

## 2021-10-28 DIAGNOSIS — E785 Hyperlipidemia, unspecified: Secondary | ICD-10-CM | POA: Diagnosis present

## 2021-10-28 DIAGNOSIS — Z888 Allergy status to other drugs, medicaments and biological substances status: Secondary | ICD-10-CM

## 2021-10-28 DIAGNOSIS — I248 Other forms of acute ischemic heart disease: Secondary | ICD-10-CM | POA: Diagnosis present

## 2021-10-28 DIAGNOSIS — J9601 Acute respiratory failure with hypoxia: Secondary | ICD-10-CM | POA: Diagnosis present

## 2021-10-28 DIAGNOSIS — N1831 Chronic kidney disease, stage 3a: Secondary | ICD-10-CM | POA: Diagnosis not present

## 2021-10-28 DIAGNOSIS — I252 Old myocardial infarction: Secondary | ICD-10-CM | POA: Diagnosis not present

## 2021-10-28 DIAGNOSIS — Z955 Presence of coronary angioplasty implant and graft: Secondary | ICD-10-CM | POA: Diagnosis not present

## 2021-10-28 DIAGNOSIS — Z8551 Personal history of malignant neoplasm of bladder: Secondary | ICD-10-CM | POA: Diagnosis not present

## 2021-10-28 DIAGNOSIS — I5031 Acute diastolic (congestive) heart failure: Secondary | ICD-10-CM | POA: Diagnosis not present

## 2021-10-28 DIAGNOSIS — R079 Chest pain, unspecified: Secondary | ICD-10-CM | POA: Diagnosis not present

## 2021-10-28 DIAGNOSIS — M109 Gout, unspecified: Secondary | ICD-10-CM | POA: Diagnosis present

## 2021-10-28 DIAGNOSIS — E119 Type 2 diabetes mellitus without complications: Secondary | ICD-10-CM | POA: Diagnosis present

## 2021-10-28 DIAGNOSIS — I160 Hypertensive urgency: Secondary | ICD-10-CM | POA: Diagnosis not present

## 2021-10-28 DIAGNOSIS — I5032 Chronic diastolic (congestive) heart failure: Secondary | ICD-10-CM | POA: Diagnosis not present

## 2021-10-28 DIAGNOSIS — I509 Heart failure, unspecified: Secondary | ICD-10-CM | POA: Diagnosis not present

## 2021-10-28 HISTORY — DX: Other specified symptoms and signs involving the circulatory and respiratory systems: R09.89

## 2021-10-28 HISTORY — DX: Chronic diastolic (congestive) heart failure: I50.32

## 2021-10-28 HISTORY — DX: Atherosclerotic heart disease of native coronary artery without angina pectoris: I25.10

## 2021-10-28 LAB — CBC
HCT: 48.7 % (ref 39.0–52.0)
Hemoglobin: 15.3 g/dL (ref 13.0–17.0)
MCH: 29.4 pg (ref 26.0–34.0)
MCHC: 31.4 g/dL (ref 30.0–36.0)
MCV: 93.5 fL (ref 80.0–100.0)
Platelets: 254 10*3/uL (ref 150–400)
RBC: 5.21 MIL/uL (ref 4.22–5.81)
RDW: 15.2 % (ref 11.5–15.5)
WBC: 7.5 10*3/uL (ref 4.0–10.5)
nRBC: 0 % (ref 0.0–0.2)

## 2021-10-28 LAB — TROPONIN I (HIGH SENSITIVITY)
Troponin I (High Sensitivity): 60 ng/L — ABNORMAL HIGH (ref <18)
Troponin I (High Sensitivity): 62 ng/L — ABNORMAL HIGH (ref <18)

## 2021-10-28 LAB — BASIC METABOLIC PANEL
Anion gap: 7 (ref 5–15)
BUN: 38 mg/dL — ABNORMAL HIGH (ref 8–23)
CO2: 28 mmol/L (ref 22–32)
Calcium: 9.2 mg/dL (ref 8.9–10.3)
Chloride: 104 mmol/L (ref 98–111)
Creatinine, Ser: 2.79 mg/dL — ABNORMAL HIGH (ref 0.61–1.24)
GFR, Estimated: 23 mL/min — ABNORMAL LOW (ref >60)
Glucose, Bld: 131 mg/dL — ABNORMAL HIGH (ref 70–99)
Potassium: 4.9 mmol/L (ref 3.5–5.1)
Sodium: 139 mmol/L (ref 135–145)

## 2021-10-28 LAB — BRAIN NATRIURETIC PEPTIDE: B Natriuretic Peptide: 238.3 pg/mL — ABNORMAL HIGH (ref 0.0–100.0)

## 2021-10-28 MED ORDER — NITROGLYCERIN IN D5W 200-5 MCG/ML-% IV SOLN
0.0000 ug/min | INTRAVENOUS | Status: DC
  Administered 2021-10-29: 5 ug/min via INTRAVENOUS
  Filled 2021-10-28: qty 250

## 2021-10-28 MED ORDER — FUROSEMIDE 10 MG/ML IJ SOLN
60.0000 mg | Freq: Once | INTRAMUSCULAR | Status: AC
  Administered 2021-10-28: 60 mg via INTRAVENOUS
  Filled 2021-10-28: qty 8

## 2021-10-28 MED ORDER — FENTANYL CITRATE PF 50 MCG/ML IJ SOSY
50.0000 ug | PREFILLED_SYRINGE | Freq: Once | INTRAMUSCULAR | Status: AC
  Administered 2021-10-28: 50 ug via INTRAVENOUS
  Filled 2021-10-28: qty 1

## 2021-10-28 MED ORDER — ASPIRIN 81 MG PO CHEW
324.0000 mg | CHEWABLE_TABLET | Freq: Once | ORAL | Status: AC
  Administered 2021-10-28: 324 mg via ORAL
  Filled 2021-10-28: qty 4

## 2021-10-28 MED ORDER — NITROGLYCERIN 0.4 MG SL SUBL
0.4000 mg | SUBLINGUAL_TABLET | SUBLINGUAL | Status: DC | PRN
  Administered 2021-10-28: 0.4 mg via SUBLINGUAL
  Filled 2021-10-28: qty 1

## 2021-10-28 NOTE — ED Triage Notes (Signed)
Pt c/o bilateral posterior shoulder pain that radiates from the left side of anterior chest and down left arm. Pt seen at cardiologist x2 days prior and had BP med increased to '30mg'$  Lisinopril due to BP. Pt with extensive cardiac hx, including CHF at the age of 67, 4-5 MI's with 4 stents, quadruple Bi-pas. Pts BP systolic over 119 x2 days. Pt took 1 Nitro PO at6 1630 with slight relief. In triage, pts oxygen 87% on RA. Pt placed on 2L oxygen, raising to 93%.

## 2021-10-28 NOTE — ED Provider Notes (Signed)
[facility] Provider Note    Event Date/Time   First MD Initiated Contact with Patient 10/28/21 1938     (approximate)   History   Chest Pain   HPI  Tony Williams is a 76 y.o. male  who presents to the emergency department today because of concern for chest pain.  Started today a little after lunch.  Located in his left chest it does radiate to his left shoulder blade and left arm.  Has been accompanied by some shortness of breath.  Did take a nitroglycerin a couple hours after the pain started and it did seem to help for a short amount of time.  In addition the patient has complaints of high blood pressure.  It has been elevated recently but for the past 2 days it has been more severely elevated.  Was recently increased on one of his blood pressure medications.     Physical Exam   Triage Vital Signs: ED Triage Vitals [10/28/21 1933]  Enc Vitals Group     BP (!) 208/64     Pulse Rate (!) 56     Resp (!) 24     Temp 97.9 F (36.6 C)     Temp Source Oral     SpO2 (!) 87 %     Weight      Height      Head Circumference      Peak Flow      Pain Score      Pain Loc      Pain Edu?      Excl. in [place]?     Most recent vital signs: Vitals:   10/28/21 1933  BP: (!) 208/64  Pulse: (!) 56  Resp: (!) 24  Temp: 97.9 F (36.6 C)  SpO2: (!) 87%    General: Awake, alert, oriented. CV:  Good peripheral perfusion. Regular rate and rhythm. Resp:  Normal effort. Lungs clear. Abd:  No distention. Non tender.    ED Results / Procedures / Treatments   Labs (all labs ordered are listed, but only abnormal results are displayed) Labs Reviewed  BASIC METABOLIC PANEL - Abnormal; Notable for the following components:      Result Value   Glucose, Bld 131 (*)    BUN 38 (*)    Creatinine, Ser 2.79 (*)    GFR, Estimated 23 (*)    All other components within normal limits  BRAIN NATRIURETIC PEPTIDE - Abnormal; Notable for the following components:   B  Natriuretic Peptide 238.3 (*)    All other components within normal limits  TROPONIN I (HIGH SENSITIVITY) - Abnormal; Notable for the following components:   Troponin I (High Sensitivity) 60 (*)    All other components within normal limits  TROPONIN I (HIGH SENSITIVITY) - Abnormal; Notable for the following components:   Troponin I (High Sensitivity) 62 (*)    All other components within normal limits  CBC     EKG  I, Nance Pear, attending physician, personally viewed and interpreted this EKG  EKG Time: 1923 Rate: 54 Rhythm: sinus bradycardia Axis: normal Intervals: qtc 428 QRS: narrow, q waves v1 ST changes: no st elevation Impression: abnormal ekg    RADIOLOGY I independently interpreted and visualized the CXR. My interpretation: No pneumonia. No pneumothorax. Radiology interpretation:    IMPRESSION:  Changes of mild CHF.      PROCEDURES:  Critical Care performed: No  Procedures   MEDICATIONS ORDERED IN ED: Medications - No data  to display   IMPRESSION / MDM / [place] / ED COURSE  I reviewed the triage vital signs and the nursing notes.                              Differential diagnosis includes, but is not limited to, ACS, enema, pneumonia, pneumothorax.  Patient's presentation is most consistent with acute presentation with potential threat to life or bodily function.  Patient presented to the emergency department today because of concerns for chest pain and shortness of breath.  Patient does have extensive cardiac history.  EKG without any ST elevation.  Chest x-ray was concerning for pulmonary edema.  Blood work did show elevation of initial troponin at 60.  At this time however is unclear if this was secondary to true ACS versus CHF.  Patient was given aspirin as well as nitroglycerin.  Repeat troponin fairly unchanged 62.  At this time will hold off on heparin.  Will give patient Lasix for his CHF.  Discussed with Dr. Damita Dunnings with the  hospitalist service who will plan on admission.   FINAL CLINICAL IMPRESSION(S) / ED DIAGNOSES   Final diagnoses:  Gastrointestinal hemorrhage, unspecified gastrointestinal hemorrhage type  Anemia, unspecified type      Note:  This document was prepared using Dragon voice recognition software and may include unintentional dictation errors.    Nance Pear, MD 10/28/21 [phone]

## 2021-10-29 ENCOUNTER — Inpatient Hospital Stay (HOSPITAL_COMMUNITY)
Admit: 2021-10-29 | Discharge: 2021-10-29 | Disposition: A | Discharge status: Home / Self Care | Payer: Medicare Other | Attending: Cardiovascular Disease | Admitting: Cardiovascular Disease

## 2021-10-29 ENCOUNTER — Encounter: Payer: Self-pay | Admitting: Internal Medicine

## 2021-10-29 DIAGNOSIS — J449 Chronic obstructive pulmonary disease, unspecified: Secondary | ICD-10-CM | POA: Diagnosis not present

## 2021-10-29 DIAGNOSIS — I5032 Chronic diastolic (congestive) heart failure: Secondary | ICD-10-CM

## 2021-10-29 DIAGNOSIS — E1142 Type 2 diabetes mellitus with diabetic polyneuropathy: Secondary | ICD-10-CM

## 2021-10-29 DIAGNOSIS — I200 Unstable angina: Secondary | ICD-10-CM

## 2021-10-29 DIAGNOSIS — I5031 Acute diastolic (congestive) heart failure: Secondary | ICD-10-CM | POA: Diagnosis not present

## 2021-10-29 DIAGNOSIS — N1831 Chronic kidney disease, stage 3a: Secondary | ICD-10-CM

## 2021-10-29 DIAGNOSIS — I160 Hypertensive urgency: Secondary | ICD-10-CM

## 2021-10-29 DIAGNOSIS — I25118 Atherosclerotic heart disease of native coronary artery with other forms of angina pectoris: Secondary | ICD-10-CM

## 2021-10-29 DIAGNOSIS — N183 Chronic kidney disease, stage 3 unspecified: Secondary | ICD-10-CM

## 2021-10-29 DIAGNOSIS — N179 Acute kidney failure, unspecified: Secondary | ICD-10-CM

## 2021-10-29 DIAGNOSIS — Z951 Presence of aortocoronary bypass graft: Secondary | ICD-10-CM

## 2021-10-29 LAB — ECHOCARDIOGRAM COMPLETE
AR max vel: 2.42 cm2
AV Area VTI: 2.6 cm2
AV Area mean vel: 2.14 cm2
AV Mean grad: 4 mmHg
AV Peak grad: 6.2 mmHg
Ao pk vel: 1.24 m/s
Area-P 1/2: 2.94 cm2
S' Lateral: 4.53 cm

## 2021-10-29 LAB — CBG MONITORING, ED
Glucose-Capillary: 143 mg/dL — ABNORMAL HIGH (ref 70–99)
Glucose-Capillary: 127 mg/dL — ABNORMAL HIGH (ref 70–99)
Glucose-Capillary: 148 mg/dL — ABNORMAL HIGH (ref 70–99)
Glucose-Capillary: 116 mg/dL — ABNORMAL HIGH (ref 70–99)

## 2021-10-29 LAB — GLUCOSE, CAPILLARY
Glucose-Capillary: 122 mg/dL — ABNORMAL HIGH (ref 70–99)
Glucose-Capillary: 161 mg/dL — ABNORMAL HIGH (ref 70–99)

## 2021-10-29 LAB — TROPONIN I (HIGH SENSITIVITY)
Troponin I (High Sensitivity): 71 ng/L — ABNORMAL HIGH (ref <18)
Troponin I (High Sensitivity): 63 ng/L — ABNORMAL HIGH (ref <18)

## 2021-10-29 MED ORDER — ENOXAPARIN SODIUM 80 MG/0.8ML IJ SOSY
0.5000 mg/kg | PREFILLED_SYRINGE | INTRAMUSCULAR | Status: DC
  Administered 2021-10-29: 62.5 mg via SUBCUTANEOUS
  Filled 2021-10-29: qty 0.63

## 2021-10-29 MED ORDER — INSULIN ASPART 100 UNIT/ML IJ SOLN: 0.0000 [IU] | INTRAMUSCULAR | Status: DC

## 2021-10-29 MED ORDER — ACETAMINOPHEN 325 MG PO TABS
650.0000 mg | ORAL_TABLET | ORAL | Status: DC | PRN
  Administered 2021-10-29 (×2): 650 mg via ORAL
  Filled 2021-10-29 (×3): qty 2

## 2021-10-29 MED ORDER — ENOXAPARIN SODIUM 30 MG/0.3ML IJ SOSY
30.0000 mg | PREFILLED_SYRINGE | INTRAMUSCULAR | Status: DC
  Administered 2021-10-30 – 2021-10-31 (×2): 30 mg via SUBCUTANEOUS
  Filled 2021-10-29 (×2): qty 0.3

## 2021-10-29 MED ORDER — ONDANSETRON HCL 4 MG/2ML IJ SOLN: 4.0000 mg | Freq: Four times a day (QID) | INTRAMUSCULAR | Status: DC | PRN

## 2021-10-29 MED ORDER — HYDROMORPHONE HCL 1 MG/ML IJ SOLN
1.0000 mg | INTRAMUSCULAR | Status: DC | PRN
  Administered 2021-10-29 – 2021-10-30 (×6): 1 mg via INTRAVENOUS
  Filled 2021-10-29 (×6): qty 1

## 2021-10-29 MED ORDER — ASPIRIN 81 MG PO TBEC
81.0000 mg | DELAYED_RELEASE_TABLET | Freq: Every day | ORAL | Status: DC
  Administered 2021-10-29 – 2021-10-31 (×3): 81 mg via ORAL
  Filled 2021-10-29 (×3): qty 1

## 2021-10-29 MED ORDER — PRAVASTATIN SODIUM 40 MG PO TABS
40.0000 mg | ORAL_TABLET | Freq: Every day | ORAL | Status: DC
  Administered 2021-10-29 – 2021-10-30 (×2): 40 mg via ORAL
  Filled 2021-10-29: qty 2
  Filled 2021-10-29: qty 1

## 2021-10-29 MED ORDER — FENOFIBRATE 160 MG PO TABS
160.0000 mg | ORAL_TABLET | Freq: Every day | ORAL | Status: DC
  Administered 2021-10-29 – 2021-10-31 (×3): 160 mg via ORAL
  Filled 2021-10-29 (×3): qty 1

## 2021-10-29 MED ORDER — EZETIMIBE 10 MG PO TABS
10.0000 mg | ORAL_TABLET | Freq: Every day | ORAL | Status: DC
  Administered 2021-10-29 – 2021-10-31 (×3): 10 mg via ORAL
  Filled 2021-10-29 (×3): qty 1

## 2021-10-29 MED ORDER — ENOXAPARIN SODIUM 80 MG/0.8ML IJ SOSY
0.5000 mg/kg | PREFILLED_SYRINGE | INTRAMUSCULAR | Status: DC
  Filled 2021-10-29: qty 0.63

## 2021-10-29 MED ORDER — PERFLUTREN LIPID MICROSPHERE
1.0000 mL | INTRAVENOUS | Status: AC | PRN
  Administered 2021-10-29: 3 mL via INTRAVENOUS

## 2021-10-29 MED ORDER — INSULIN ASPART 100 UNIT/ML IJ SOLN
0.0000 [IU] | INTRAMUSCULAR | Status: DC
  Administered 2021-10-29 (×3): 3 [IU] via SUBCUTANEOUS
  Administered 2021-10-29: 4 [IU] via SUBCUTANEOUS
  Administered 2021-10-30: 3 [IU] via SUBCUTANEOUS
  Administered 2021-10-30: 4 [IU] via SUBCUTANEOUS
  Administered 2021-10-30: 3 [IU] via SUBCUTANEOUS
  Administered 2021-10-30: 7 [IU] via SUBCUTANEOUS
  Administered 2021-10-30 – 2021-10-31 (×2): 3 [IU] via SUBCUTANEOUS
  Administered 2021-10-31: 4 [IU] via SUBCUTANEOUS
  Filled 2021-10-29 (×11): qty 1

## 2021-10-29 MED ORDER — AMLODIPINE BESYLATE 5 MG PO TABS
2.5000 mg | ORAL_TABLET | Freq: Every day | ORAL | Status: DC
  Administered 2021-10-29: 2.5 mg via ORAL
  Filled 2021-10-29: qty 1

## 2021-10-29 MED ORDER — LISINOPRIL 10 MG PO TABS: 30.0000 mg | ORAL_TABLET | Freq: Every day | ORAL | Status: DC

## 2021-10-29 MED ORDER — ALBUTEROL SULFATE (2.5 MG/3ML) 0.083% IN NEBU
2.5000 mg | INHALATION_SOLUTION | Freq: Four times a day (QID) | RESPIRATORY_TRACT | Status: DC | PRN
Start: 2021-10-29 — End: 2021-10-31

## 2021-10-29 MED ORDER — FUROSEMIDE 10 MG/ML IJ SOLN
40.0000 mg | Freq: Two times a day (BID) | INTRAMUSCULAR | Status: DC
  Filled 2021-10-29: qty 4

## 2021-10-29 MED ORDER — EMPAGLIFLOZIN 25 MG PO TABS
25.0000 mg | ORAL_TABLET | Freq: Every day | ORAL | Status: DC
  Filled 2021-10-29 (×2): qty 1

## 2021-10-29 NOTE — Assessment & Plan Note (Signed)
Likely multifactorial related to acute CHF with underlying OSA and COPD Patient was tachypneic on arrival speaking in short sentences with O2 sat 87% on room air Supplemental oxygen at 2 L and wean as tolerated

## 2021-10-29 NOTE — Consult Note (Signed)
Cardiology Consult    Patient ID: Tony Williams MRN: [id number], DOB/AGE: [birth year]   Admit date: 10/28/2021 Date of Consult: 10/29/2021  Primary Physician: Tony Crews, MD Primary Cardiologist: Tony Sable, MD Requesting Provider: Laurey Arrow, MD  Patient Profile    Tony Williams is a 76 y.o. male with a history of with a history of CAD status post four-vessel bypass in 2007 with subsequent interventions, hypertension, hyperlipidemia (statin intolerant), obesity, diabetes, remote tobacco abuse, COPD, sleep apnea (not using CPAP), and stage III chronic kidney disease who is being seen today for the evaluation of chest pain and mild troponin elevation in the setting of hypertensive urgency at the request of Dr. Si Williams.  Past Medical History   Past Medical History:  Diagnosis Date   Bladder cancer ([place]) 2013   CAD (coronary artery disease)    a. 2007 CABG x 4: LIMA->D1, VG->OM1, VG->D2->mLAD; b. 2018 NSTEMI/PCI: DES to VG->OM; c. 10/2020 NSTEMI/PCI: LM 90d, LAD 141m D1 100, LCX 100ost/p, RCA sev diff dzs, VG->D2->mLAD 99/90 before D2 (4.0x15 Onyx Frontier DES), 90 before mLAD (4.0x15 Onyx Frontier DES), LIMA->D1 ok, VG->OM1 100.   Chronic heart failure with preserved ejection fraction (HFpEF) ([facility]    a. 10/2020 Echo: EF 50-55%, apical HK, GrII DD, mod dil LA; b. 11/2020 Echo: EF 60-65%, no rwma.   Chronic kidney disease    COPD (chronic obstructive pulmonary disease) (HCC)    Depression    Diabetes mellitus without complication (HCC)    Hx of CABG x 4    LIMA-D2, SVG-OM (occluded), SeqSVG-D1-LAD.   Labile hypertension    Myocardial infarction ([facility]     Past Surgical History:  Procedure Laterality Date   CHOLECYSTECTOMY, LAPAROSCOPIC  12/26/1995   COLONOSCOPY WITH PROPOFOL N/A 09/23/2020   Procedure: COLONOSCOPY WITH PROPOFOL;  Surgeon: Tony Lame MD;  [facility];  Service: Endoscopy;  Laterality: N/A;   CORONARY ARTERY BYPASS GRAFT     LIMA-D2,  SVG-OM, SeqSVG-D1-LAD   CORONARY STENT INTERVENTION N/A 11/12/2020   Procedure: CORONARY STENT INTERVENTION;  Surgeon: AWellington Hampshire MD;  [facility];  Service: Cardiovascular;  Laterality: N/A;   EYE SURGERY     IABP INSERTION N/A 11/11/2020   Procedure: IABP Insertion;  Surgeon: ENelva Bush MD;  [facility];  Service: Cardiovascular;  Laterality: N/A;   IR STENT PLACEMENT ANTE CAROTID INC ANGIO     KNEE SURGERY     RIGHT/LEFT HEART CATH AND CORONARY/GRAFT ANGIOGRAPHY N/A 11/11/2020   Procedure: RIGHT/LEFT HEART CATH AND CORONARY/GRAFT ANGIOGRAPHY;  Surgeon: ENelva Bush MD;  [facility];  Service: Cardiovascular;  Severe native CAD: 90% dLMCA-100% mLAD/100% ost LCx CTO w/ Severe diffuse RCA disease. Widely patent LIMA-D1. Patent sequential SVG-D2-LAD with 99% mid graft & 90% LIMA-D2 @ anastomosis. previously stented SVG-OM - CTO. Mildly   TRANSURETHRAL RESECTION OF PROSTATE  1994   URINARY SPHINCTER IMPLANT  1995     Allergies  Allergies  Allergen Reactions   Butorphanol Itching   Morphine Other (See Comments)    Redness around IV site   Pedi-Pre Tape Spray [Wound Dressing Adhesive] Itching   Simvastatin Other (See Comments)    "Destroyed my muscles"   Statins Other (See Comments)    "Destroyed my muscles"    History of Present Illness    76y.o. male with a history of with a history of CAD status post four-vessel bypass in 2007 with subsequent interventions, hypertension, hyperlipidemia (statin intolerant), obesity, diabetes,  remote tobacco abuse, COPD, sleep apnea (not using CPAP), and stage III chronic kidney disease.  As noted, he underwent four-vessel bypass in 2007 in [place].  In 2018, he suffered a non-STEMI and required stenting of the vein graft to the obtuse marginal.  He subsequently moved to the [place] area and establish care with our practice.  In March 2022, he was evaluated for chest discomfort  underwent stress testing which showed basal inferior, basal inferolateral, mid inferior, and mid inferolateral defect consistent with infarct and moderate peri-infarct ischemia.  EF was 45%.  This was followed by an echocardiogram which showed an EF of 60 to 65% without regional wall motion abnormalities.  He was medically managed.  In August 2022, he was admitted with angina and ruled in for non-STEMI.  Echo showed an EF of 50 to 55% with apical hypokinesis and grade 2 diastolic dysfunction.  He was volume overloaded and required diuresis with subsequent development of mild acute kidney injury.  He subsequently underwent diagnostic catheterization revealing an occlusion of the previously stented vein graft to the obtuse marginal as well as severe disease in the's quintal vein graft to the D2 and mid LAD with stenoses prior to insertion of both vessels.  He required placement of intra-aortic balloon pump was transferred to [facility] for high risk PCI, which was performed the following day with 2 drug-eluting stents placed in the sequential vein graft.  During admission, he was noted to have intermittent bradycardia in 2-1 AV block.  Beta-blocker was initially discontinued the resumed at a low dose prior to discharge.  In the setting of elevated blood pressures in the outpatient setting, patient self titrated his beta-blocker at home which resulted in marked sinus bradycardia with rates in the 30s and 40s and prompted ED evaluation.  Beta-blocker was discontinued without any further significant bradycardia.  He was seen electrophysiology and he was not felt to require pacing.  [name] has dealt with labile hypertension and occasional orthostatic hypotension which has limited his antihypertensive therapy.  He had been on amlodipine in the fall 2022 but this was discontinued secondary to soft blood pressures.  He saw Dr. Garen Williams on 8/14 at which time he had no new symptoms but his BP was elevated @ 148/70.   Lisinopril was increased from '20mg'$  daily to '30mg'$  daily.  The following day, 8/15, he began to note a headache in the setting of higher BPs, sometimes over 030 systolic.  He then began to experience left shoulder and substernal chest discomfort.  In the setting of symptoms and ongoing BP elevations, he decided to present to the ED on 8/16.  Here ECG showed sinus brady @ 54 w/ prior anterior infarct and inferolateral ST depression.  BP 208/64.  Labs notable for BNP 238.3, HsTrops 60  62  71  63.  BUN/Creat sl worse than recent eval in May - currently 38/2.79 (was 34/2.05).  CXR w/ mild CHF.  He was treated w/ lasix '40mg'$  IV and started on IV ntg, however, IV ntg d/c'd in setting of worsening headache after ~ 1 hr.  He cont to have headache despite receiving IV fentanyl earlier and also notes occasional "twinges" of fleeting chest pain.  Inpatient Medications     amLODipine  2.5 mg Oral Daily   aspirin EC  81 mg Oral Daily   empagliflozin  25 mg Oral QAC breakfast   enoxaparin (LOVENOX) injection  0.5 mg/kg Subcutaneous Q24H   fenofibrate  160 mg Oral Daily  insulin aspart  0-20 Units Subcutaneous Q4H   pravastatin  40 mg Oral q1800    Family History    Family History  Problem Relation Age of Onset   Heart Problems Mother    Heart Problems Father    He indicated that his mother is deceased. He indicated that his father is deceased.   Social History    Social History   Socioeconomic History   Marital status: Married    Spouse name: Not on file   Number of children: 2   Years of education: Not on file   Highest education level: High school graduate  Occupational History   Occupation: retired  Tobacco Use   Smoking status: Former    Packs/day: 1.00    Years: 30.00    Total pack years: 30.00    Types: Cigarettes    Quit date: 10/23/2003    Years since quitting: 18.0   Smokeless tobacco: Never  Vaping Use   Vaping Use: Never used  Substance and Sexual Activity   Alcohol use:  Never   Drug use: Never   Sexual activity: Not on file  Other Topics Concern   Not on file  Social History Narrative   Not on file   Social Determinants of Health   Financial Resource Strain: Low Risk  (05/19/2021)   Overall Financial Resource Strain (CARDIA)    Difficulty of Paying Living Expenses: Not hard at all  Food Insecurity: No Food Insecurity (05/19/2021)   Hunger Vital Sign    Worried About Running Out of Food in the Last Year: Never true    [place] in the Last Year: Never true  Transportation Needs: No Transportation Needs (05/19/2021)   PRAPARE - Hydrologist (Medical): No    Lack of Transportation (Non-Medical): No  Physical Activity: Inactive (05/19/2021)   Exercise Vital Sign    Days of Exercise per Week: 0 days    Minutes of Exercise per Session: 0 min  Stress: No Stress Concern Present (05/19/2021)   [place]    Feeling of Stress : Only a little  Recent Concern: Stress - Stress Concern Present (02/26/2021)   [place]    Feeling of Stress : To some extent  Social Connections: Moderately Integrated (05/19/2021)   Social Connection and Isolation Panel [NHANES]    Frequency of Communication with Friends and Family: More than three times a week    Frequency of Social Gatherings with Friends and Family: Twice a week    Attends Religious Services: More than 4 times per year    Active Member of [employer] or Organizations: No    Attends Archivist Meetings: Never    Marital Status: Married  Human resources officer Violence: Not At Risk (05/19/2021)   Humiliation, Afraid, Rape, and Kick questionnaire    Fear of Current or Ex-Partner: No    Emotionally Abused: No    Physically Abused: No    Sexually Abused: No     Review of Systems    General:  No chills, fever, night sweats or weight changes.   Cardiovascular:  +++ chest pain, +++ chronic dyspnea on exertion, no edema, orthopnea, palpitations, paroxysmal nocturnal dyspnea. Dermatological: No rash, lesions/masses Respiratory: No cough, +++ chronic dyspnea Urologic: No hematuria, dysuria Abdominal:   No nausea, vomiting, diarrhea, bright red blood per rectum, melena, or hematemesis Neurologic:  No visual changes, wkns,  changes in mental status. +++ headache. All other systems reviewed and are otherwise negative except as noted above.  Physical Exam    Blood pressure (!) 132/59, pulse 61, temperature 98 F (36.7 C), temperature source Oral, resp. rate 16, SpO2 97 %.  General: Pleasant, NAD Psych: Normal affect. Neuro: Alert and oriented X 3. Moves all extremities spontaneously. HEENT: Normal  Neck: Supple without bruits or JVD. Lungs:  Resp regular and unlabored, CTA. Heart: RRR no s3, s4, or murmurs. Abdomen: Soft, non-tender, non-distended, BS + x 4.  Extremities: No clubbing, cyanosis or edema. DP/PT2+, Radials 2+ and equal bilaterally.  Labs    Cardiac Enzymes Recent Labs  Lab 10/28/21 1943 10/28/21 2124 10/29/21 0055 10/29/21 0509  TROPONINIHS 60* 62* 71* 63*     BNP    Component Value Date/Time   BNP 238.3 (H) 10/28/2021 1943    ProBNP    Component Value Date/Time   PROBNP 715 (H) 09/17/2021 1547    Lab Results  Component Value Date   WBC 7.5 10/28/2021   HGB 15.3 10/28/2021   HCT 48.7 10/28/2021   MCV 93.5 10/28/2021   PLT 254 10/28/2021    Recent Labs  Lab 10/28/21 1943  NA 139  K 4.9  CL 104  CO2 28  BUN 38*  CREATININE 2.79*  CALCIUM 9.2  GLUCOSE 131*   Lab Results  Component Value Date   CHOL 200 07/22/2021   HDL 33 (L) 07/22/2021   LDLCALC 90 07/22/2021   TRIG 384 (H) 07/22/2021   Lab Results  Component Value Date   DDIMER 0.91 (H) 01/26/2021      Radiology Studies    DG Chest Port 1 View  Result Date: 10/28/2021 CLINICAL DATA:  Chest pain and shoulder pain,  initial encounter EXAM: PORTABLE CHEST 1 VIEW COMPARISON:  01/28/2021 FINDINGS: Cardiac shadow is stable. Postsurgical changes are again seen. Vascular congestion is noted with very mild interstitial edema. The overall appearance is slightly worse than that seen on the prior study. Chronic blunting of the right costophrenic angle is noted. No bony abnormality is seen. IMPRESSION: Changes of mild CHF. Electronically Signed   By: Inez Catalina M.D.   On: 10/28/2021 20:02    ECG & Cardiac Imaging    sinus brady @ 54 w/ prior anterior infarct and inferolateral ST depression. - personally reviewed.  Assessment & Plan    1.  Demand ischemia/coronary artery disease: Patient with a history of CAD status post CABG in 2007 with recent non-STEMI in August 2022 and stenting of the sequential vein graft to the diagonal and LAD secondary to separate lesions proximal to insertion sites of both vessels.  Catheterization at that time also notable for severe native multivessel disease with an occlusion of the vein graft to the obtuse marginal.  Patient recently seen in the office on August 14, at which time he was feeling well the blood pressure was mildly elevated.  His lisinopril was increased to 30 mg daily at that time however, since then, he has had higher blood pressures at home associated with headache and also chest tightness.  For these reasons, he presented to the emergency department on August 16 and was found to have mild troponin elevation with relatively flat trend at 60  62  71  63.  On arrival, his systolic blood pressure 130/86 however this is improved significantly with minimal intervention.  He was initially placed on intravenous nitroglycerin but this only worsened headache and has since been discontinued.  Currently is chest pain-free but notes occasional sharp and fleeting twinges of left-sided chest pain.  ECG is without significant acute changes.  We will arrange for Lexiscan Myoview in the a.m. to  evaluate for ischemia.  Currently, he is a poor candidate for diagnostic catheterization secondary to acute on chronic kidney disease with a creatinine of 2.79.  Continue aspirin, Effient, and pravastatin.  We have added low-dose amlodipine for blood pressure.  Hold lisinopril w/ rising creat.  Adding amlodipine 2.'5mg'$  daily.  2.  Hypertensive urgency: As above, blood pressure 208/64 on arrival with complaints of headache and chest discomfort.  Mild troponin elevation as outlined above.  He was treated with intravenous Lasix but otherwise has not required as needed antihypertensives and his pressure currently is 132/59.  We will discontinue IV Lasix in the setting of worsening creatinine.  Home dose of lisinopril currently on hold.  Adding low-dose amlodipine.  Will need to follow pressures carefully as he does have a history of labile hypertension/hypotension with orthostasis in the past.  3.  Hyperlipidemia: On lovastatin and fenofibrate therapy at home with an LDL of 90 in May.  Was not able to tolerate more potent statins in the past.  We will add Zetia.  4.  Chronic HFpEF: Patient with some degree of chronic dyspnea.  Most recent echo in September 2023 showed an EF of 2060 to 65% without regional wall motion abnormalities.  Chest x-ray this admission with mild CHF and BNP of 238.3.  He has received a dose of Lasix (was on torsemide at home).  For now, we will forego any additional diuresis as volume appears stable and creatinine elevated above baseline at 2.79.  5.  Acute on chronic stage III kidney disease: As above, BUN and creatinine higher than baseline at 38 and 2.79.  Lisinopril on hold.  Was also using torsemide at home and will hold any additional diuretic at this time.  Continue SGLT2 inhibitor for now though may need to hold for worsening of renal function.  6.  Type 2 diabetes mellitus: Per medicine team.  Risk Assessment/Risk Scores:     TIMI Risk Score for Unstable Angina or Non-ST  Elevation MI:   The patient's TIMI risk score is 6, which indicates a 41% risk of all cause mortality, new or recurrent myocardial infarction or need for urgent revascularization in the next 14 days.      Signed, Murray Hodgkins, NP 10/29/2021, 11:51 AM  For questions or updates, please contact   Please consult [email or web address] for contact info under Cardiology/STEMI.

## 2021-10-29 NOTE — Assessment & Plan Note (Addendum)
Hx of CAD/CABG x 4 2007, PCI x 2 2018, PCI x 1 SVG-LAD/Diagonal 2022 Nitroglycerin infusion to titrate to pain Continue home aspirin, lovastatin, lisinopril Possibly triggered by hypertensive emergency Continue to trend troponins Low threshold for initiation of heparin (patient would like to hold off for now unless troponin trends) Cardiology consult to decide on risk stratification

## 2021-10-29 NOTE — Assessment & Plan Note (Signed)
CPAP nightly

## 2021-10-29 NOTE — Assessment & Plan Note (Signed)
Sliding scale insulin coverage Continue home Jardiance.  Will hold Amaryl

## 2021-10-29 NOTE — Assessment & Plan Note (Signed)
Not acutely exacerbated ?Continue home inhalers with DuoNebs as needed ?

## 2021-10-29 NOTE — Assessment & Plan Note (Signed)
BMI 39.64.  Complicating factor to overall prognosis and care in the setting of multiple comorbidities

## 2021-10-29 NOTE — Progress Notes (Addendum)
PROGRESS NOTE    Tony Williams  DJT:[id number] DOB: [birth year] DOA: 10/28/2021 PCP: Virginia Crews, MD     Brief Narrative:   From admission h and p Tony Williams is a 76 y.o. male with medical history significant for HTN, DM, diastolic CHF, COPD not on home oxygen, OSA on CPAP, CAD s/p CABG 2007, PCI with stent times 04/2005 and stent times 04/03/2020 who presents to the ED with left-sided chest pain radiating to the left shoulder that started in the afternoon of 8/17 while sitting in his recliner, associated with shortness of breath.  Patient states he has been running high blood pressures of late and saw his cardiologist 2 days prior and had his lisinopril increased from 20 mg to 30 mg.  When his chest pain and shortness of breath started he checked his blood pressure at home and the systolic was in the 300P and he decided to come in.  He denied nausea, vomiting or diaphoresis. ED course and data review: Afebrile pulse 56, respirations 24 with BP 208/64 and O2 sat 87% on room air.  Labs significant for troponin of 60-62 and BNP 238.  Creatinine 2.79, up from baseline of 1.7 about 7 months prior.  EKG, personally viewed and interpreted showing sinus bradycardia at 54 with T wave inversion inferior leads.  Chest x-ray showing changes of mild CHF.  Patient was treated with chewable aspirin, IV Lasix 60 mg and given a dose of fentanyl.  Hospitalist consulted for admission. While in the ED he continued to have chest pain described as sharpness radiating to the left shoulder.  He was started on a nitroglycerin infusion to titrate to pain. Discussed with patient about starting heparin given his cardiac history.  He opted to wait for another troponin and if elevated he would be open to initiating heparin infusion.  Due to O2 sat of 87% on room air on arrival and tachypnea and shortness of breath, conversational dyspnea he was started on O2 at 2 L.     Assessment & Plan:   Principal Problem:    Unstable angina (HCC) Active Problems:   Hypertensive emergency   Acute on chronic diastolic CHF (congestive heart failure) (HCC)   Acute renal failure superimposed on stage 3a chronic kidney disease (HCC)   Chronic obstructive pulmonary disease (HCC)   Controlled type 2 diabetes mellitus with diabetic polyneuropathy, without long-term current use of insulin (HCC)   S/P primary angioplasty with coronary stent   Stage 3a chronic kidney disease (HCC)   OSA (obstructive sleep apnea)   Acute respiratory failure with hypoxia (HCC)   Obesity (BMI 30-39.9)   Hx of CABG  # CAD with angina Hx CABG x4, PCI in 2018 and 2022. Presenting with hypertensive urgency and chest pain. BP much improved, chest pan also improved. Started on nitroglycerine but didn't tolerate 2/2 headache. Troponins mildly elevated and flat. Cardiology consulted - plan for nuclear stress test tomorrow - continuing home aspirin, statin, and effient  # HTN # Hypertensive urgency BP now much improved - home lisinopril held 2/2 aki - amlodipine started  # HFpEF Appears euvolemic on exam. On torsemide eod at home - resume torsemide per cardiology  # AKI on ckd stage 3b Baseline cr variable but approximately 2, here 2.79 - hold lisinopril  # Obesity Noted  # T2DM Here glucose appropriate - ssi - hold home jardiance given gfr, can resume if stable or improves  # Acute respiratory failure w/ hypoxia Noted to be 87% on room  air on arrival, placed on 3 L, but patient denies dyspnea, cxr with mild interstitial edema, has now been weaned off o2 - monitor  # OSA - cpap qhs    DVT prophylaxis: lovenox Code Status: full Family Communication: none @ bedside  Level of care: Progressive Status is: Inpatient Remains inpatient appropriate because: need for inpatient evaluation    Consultants:  cardiology  Procedures: none  Antimicrobials:  none    Subjective: Denies chest pain or  sob  Objective: Vitals:   10/29/21 0900 10/29/21 0939 10/29/21 1319 10/29/21 1533  BP: (!) 132/59  (!) 112/43 (!) 150/64  Pulse: 61  (!) 52 (!) 59  Resp: '16  16 17  '$ Temp:  98 F (36.7 C) 97.6 F (36.4 C)   TempSrc:  Oral Oral   SpO2: 97%  96% 98%    Intake/Output Summary (Last 24 hours) at 10/29/2021 1804 Last data filed at 10/29/2021 7858 Gross per 24 hour  Intake --  Output 3000 ml  Net -3000 ml   There were no vitals filed for this visit.  Examination:  General exam: Appears calm and comfortable  Respiratory system: Clear to auscultation. Respiratory effort normal. Cardiovascular system: S1 & S2 heard, RRR. No JVD, murmurs, rubs, gallops or clicks. No pedal edema. Gastrointestinal system: Abdomen is obese, soft and nontender. No organomegaly or masses felt. Normal bowel sounds heard. Central nervous system: Alert and oriented. No focal neurological deficits. Extremities: Symmetric 5 x 5 power. Skin: No rashes, lesions or ulcers Psychiatry: Judgement and insight appear normal. Mood & affect appropriate.     Data Reviewed: I have personally reviewed following labs and imaging studies  CBC: Recent Labs  Lab 10/28/21 1943  WBC 7.5  HGB 15.3  HCT 48.7  MCV 93.5  PLT 850   Basic Metabolic Panel: Recent Labs  Lab 10/28/21 1943  NA 139  K 4.9  CL 104  CO2 28  GLUCOSE 131*  BUN 38*  CREATININE 2.79*  CALCIUM 9.2   GFR: Estimated Creatinine Clearance: 30.4 mL/min (A) (by C-G formula based on SCr of 2.79 mg/dL (H)). Liver Function Tests: No results for input(s): "AST", "ALT", "ALKPHOS", "BILITOT", "PROT", "ALBUMIN" in the last 168 hours. No results for input(s): "LIPASE", "AMYLASE" in the last 168 hours. No results for input(s): "AMMONIA" in the last 168 hours. Coagulation Profile: No results for input(s): "INR", "PROTIME" in the last 168 hours. Cardiac Enzymes: No results for input(s): "CKTOTAL", "CKMB", "CKMBINDEX", "TROPONINI" in the last 168  hours. BNP (last 3 results) Recent Labs    09/17/21 1547  PROBNP 715*   HbA1C: Recent Labs    10/27/21 1356  HGBA1C 7.7*   CBG: Recent Labs  Lab 10/29/21 0510 10/29/21 0824 10/29/21 1121 10/29/21 1604  GLUCAP 143* 127* 148* 116*   Lipid Profile: No results for input(s): "CHOL", "HDL", "LDLCALC", "TRIG", "CHOLHDL", "LDLDIRECT" in the last 72 hours. Thyroid Function Tests: No results for input(s): "TSH", "T4TOTAL", "FREET4", "T3FREE", "THYROIDAB" in the last 72 hours. Anemia Panel: No results for input(s): "VITAMINB12", "FOLATE", "FERRITIN", "TIBC", "IRON", "RETICCTPCT" in the last 72 hours. Urine analysis:    Component Value Date/Time   BILIRUBINUR negative 04/18/2020 1349   PROTEINUR Positive (A) 04/18/2020 1349   UROBILINOGEN 0.2 04/18/2020 1349   NITRITE negative 04/18/2020 1349   LEUKOCYTESUR Negative 04/18/2020 1349   Sepsis Labs: '@LABRCNTIP'$ (procalcitonin:4,lacticidven:4)  )No results found for this or any previous visit (from the past 240 hour(s)).       Radiology Studies: ECHOCARDIOGRAM COMPLETE  Result Date: 10/29/2021    ECHOCARDIOGRAM REPORT   Patient Name:   Tony Williams Date of Exam: 10/29/2021 Medical Rec #:  [id number]        Height:       70.0 in Accession #:    [id number]       Weight:       276.3 lb Date of Birth:  14-Jun-1945        BSA:          2.395 m Patient Age:    36 years         BP:           132/59 mmHg Patient Gender: M                HR:           55 bpm. Exam Location:  [facility] Procedure: 2D Echo, Color Doppler, Cardiac Doppler and Intracardiac            Opacification Agent Indications:     I50.31 congestive heart failure-Acute Diastolic  History:         Patient has prior history of Echocardiogram examinations, most                  recent 11/16/2020. CAD, Prior CABG, CKD and COPD; Risk                  Factors:Diabetes.  Sonographer:     Charmayne Sheer Referring Phys:  [place] Diagnosing Phys: Ida Rogue MD  Sonographer  Comments: Technically challenging study due to limited acoustic windows. Image acquisition challenging due to patient body habitus. IMPRESSIONS  1. Left ventricular ejection fraction, by estimation, is 50 to 55%. The left ventricle has low normal function. Select images concerning for inferior wall hypokinesis. Left ventricular diastolic parameters are consistent with Grade I diastolic dysfunction (impaired relaxation).  2. Right ventricular systolic function is normal. The right ventricular size is normal.  3. Left atrial size was mildly dilated.  4. The mitral valve was not well visualized. Mild mitral valve regurgitation. No evidence of mitral stenosis.  5. The aortic valve was not well visualized. Aortic valve regurgitation is mild. No aortic stenosis is present.  6. The inferior vena cava is normal in size with greater than 50% respiratory variability, suggesting right atrial pressure of 3 mmHg. FINDINGS  Left Ventricle: Left ventricular ejection fraction, by estimation, is 50 to 55%. The left ventricle has low normal function. The left ventricle has no regional wall motion abnormalities. Definity contrast agent was given IV to delineate the left ventricular endocardial borders. The left ventricular internal cavity size was normal in size. There is no left ventricular hypertrophy. Left ventricular diastolic parameters are consistent with Grade I diastolic dysfunction (impaired relaxation). Right Ventricle: The right ventricular size is normal. No increase in right ventricular wall thickness. Right ventricular systolic function is normal. Left Atrium: Left atrial size was mildly dilated. Right Atrium: Right atrial size was normal in size. Pericardium: There is no evidence of pericardial effusion. Mitral Valve: The mitral valve was not well visualized. Mild mitral valve regurgitation. No evidence of mitral valve stenosis. Tricuspid Valve: The tricuspid valve is not well visualized. Tricuspid valve regurgitation  is not demonstrated. No evidence of tricuspid stenosis. Aortic Valve: The aortic valve was not well visualized. Aortic valve regurgitation is mild. No aortic stenosis is present. Aortic valve mean gradient measures 4.0 mmHg. Aortic valve peak gradient measures 6.2 mmHg. Aortic valve area, by  VTI measures 2.60  cm. Pulmonic Valve: The pulmonic valve was not well visualized. Pulmonic valve regurgitation is not visualized. No evidence of pulmonic stenosis. Aorta: The aortic root is normal in size and structure. Venous: The inferior vena cava is normal in size with greater than 50% respiratory variability, suggesting right atrial pressure of 3 mmHg. IAS/Shunts: No atrial level shunt detected by color flow Doppler.  LEFT VENTRICLE PLAX 2D LVIDd:         5.85 cm   Diastology LVIDs:         4.53 cm   LV e' medial:    5.33 cm/s LV PW:         1.20 cm   LV E/e' medial:  17.6 LV IVS:        0.81 cm   LV e' lateral:   9.25 cm/s LVOT diam:     2.00 cm   LV E/e' lateral: 10.1 LV SV:         58 LV SV Index:   24 LVOT Area:     3.14 cm  LEFT ATRIUM           Index LA diam:      4.40 cm 1.84 cm/m LA Vol (A4C): 45.4 ml 18.96 ml/m  AORTIC VALVE AV Area (Vmax):    2.42 cm AV Area (Vmean):   2.14 cm AV Area (VTI):     2.60 cm AV Vmax:           124.00 cm/s AV Vmean:          92.500 cm/s AV VTI:            0.222 m AV Peak Grad:      6.2 mmHg AV Mean Grad:      4.0 mmHg LVOT Vmax:         95.40 cm/s LVOT Vmean:        62.900 cm/s LVOT VTI:          0.184 m LVOT/AV VTI ratio: 0.83  AORTA Ao Root diam: 3.20 cm MITRAL VALVE MV Area (PHT): 2.94 cm    SHUNTS MV Decel Time: 258 msec    Systemic VTI:  0.18 m MV E velocity: 93.60 cm/s  Systemic Diam: 2.00 cm MV A velocity: 60.60 cm/s MV E/A ratio:  1.54 Ida Rogue MD Electronically signed by Ida Rogue MD Signature Date/Time: 10/29/2021/2:24:14 PM    Final    DG Chest Port 1 View  Result Date: 10/28/2021 CLINICAL DATA:  Chest pain and shoulder pain, initial encounter EXAM:  PORTABLE CHEST 1 VIEW COMPARISON:  01/28/2021 FINDINGS: Cardiac shadow is stable. Postsurgical changes are again seen. Vascular congestion is noted with very mild interstitial edema. The overall appearance is slightly worse than that seen on the prior study. Chronic blunting of the right costophrenic angle is noted. No bony abnormality is seen. IMPRESSION: Changes of mild CHF. Electronically Signed   By: Inez Catalina M.D.   On: 10/28/2021 20:02        Scheduled Meds:  amLODipine  2.5 mg Oral Daily   aspirin EC  81 mg Oral Daily   [START ON 10/30/2021] enoxaparin (LOVENOX) injection  30 mg Subcutaneous Q24H   ezetimibe  10 mg Oral Daily   fenofibrate  160 mg Oral Daily   insulin aspart  0-20 Units Subcutaneous Q4H   pravastatin  40 mg Oral q1800   Continuous Infusions:     LOS: 1 day     Desma Maxim, MD  [facility]   If 7PM-7AM, please contact night-coverage [email or web address] Password [facility] 10/29/2021, 6:04 PM

## 2021-10-29 NOTE — Progress Notes (Signed)
Anticoagulation monitoring(Lovenox):  76 yo male ordered Lovenox 30 mg Q24h    There were no vitals filed for this visit. BMI 39.64   Lab Results  Component Value Date   CREATININE 2.79 (H) 10/28/2021   CREATININE 2.05 (H) 08/06/2021   CREATININE 1.70 (H) 03/11/2021   Estimated Creatinine Clearance: 30.4 mL/min (A) (by C-G formula based on SCr of 2.79 mg/dL (H)). Hemoglobin & Hematocrit     Component Value Date/Time   HGB 15.3 10/28/2021 1943   HGB 15.6 04/22/2020 1017   HCT 48.7 10/28/2021 1943   HCT 46.9 04/22/2020 1017     Per Protocol for Patient with estCrcl > 30 ml/min and BMI > 30, will transition to Lovenox 62.5 mg Q24h.

## 2021-10-29 NOTE — ED Notes (Signed)
Pt c/o 10/10 pain in head and request for Nitro drip to be stopped. Pt also c/o continued chest pain 7/10. MD contacted by phone.

## 2021-10-29 NOTE — Assessment & Plan Note (Signed)
Systolic in the 208Y on arrival Expecting reduction with nitroglycerin infusion Continue lisinopril 30.  Also getting IV Lasix and nitroglycerin

## 2021-10-29 NOTE — Assessment & Plan Note (Signed)
Symptomatic for shortness of breath and BNP elevated to near 300 with mild CHF on chest x-ray IV Lasix and continue home lisinopril Daily weights with intake and output monitoring We will get echocardiogram

## 2021-10-29 NOTE — H&P (Signed)
History and Physical    Patient: Tony Williams AVW:[id number] DOB: 04/18/1945 DOA: 10/28/2021 DOS: the patient was seen and examined on 10/29/2021 PCP: Virginia Crews, MD  Patient coming from: Home  Chief Complaint:  Chief Complaint  Patient presents with   Chest Pain    HPI: Tony Williams is a 76 y.o. male with medical history significant for HTN, DM, diastolic CHF, COPD not on home oxygen, OSA on CPAP, CAD s/p CABG 2007, PCI with stent times 04/2005 and stent times 04/03/2020 who presents to the ED with left-sided chest pain radiating to the left shoulder that started in the afternoon of 8/17 while sitting in his recliner, associated with shortness of breath.  Patient states he has been running high blood pressures of late and saw his cardiologist 2 days prior and had his lisinopril increased from 20 mg to 30 mg.  When his chest pain and shortness of breath started he checked his blood pressure at home and the systolic was in the 829F and he decided to come in.  He denied nausea, vomiting or diaphoresis. ED course and data review: Afebrile pulse 56, respirations 24 with BP 208/64 and O2 sat 87% on room air.  Labs significant for troponin of 60-62 and BNP 238.  Creatinine 2.79, up from baseline of 1.7 about 7 months prior.  EKG, personally viewed and interpreted showing sinus bradycardia at 54 with T wave inversion inferior leads.  Chest x-ray showing changes of mild CHF.  Patient was treated with chewable aspirin, IV Lasix 60 mg and given a dose of fentanyl.  Hospitalist consulted for admission. While in the ED he continued to have chest pain described as sharpness radiating to the left shoulder.  He was started on a nitroglycerin infusion to titrate to pain. Discussed with patient about starting heparin given his cardiac history.  He opted to wait for another troponin and if elevated he would be open to initiating heparin infusion.  Due to O2 sat of 87% on room air on arrival and  tachypnea and shortness of breath, conversational dyspnea he was started on O2 at 2 L.  Patient will be admitted to progressive.     Past Medical History:  Diagnosis Date   CAD S/P percutaneous coronary angioplasty    CAD, multiple vessel    Cancer ([place]) 2013   Bladder   Chronic kidney disease    COPD (chronic obstructive pulmonary disease) (HCC)    Depression    Diabetes mellitus without complication (HCC)    Heart problem    Hx of CABG x 4    LIMA-D2, SVG-OM (occluded), SeqSVG-D1-LAD.   Myocardial infarction [facility])    Past Surgical History:  Procedure Laterality Date   CHOLECYSTECTOMY, LAPAROSCOPIC  12/26/1995   COLONOSCOPY WITH PROPOFOL N/A 09/23/2020   Procedure: COLONOSCOPY WITH PROPOFOL;  Surgeon: Lucilla Lame, MD;  Location: [facility] ENDOSCOPY;  Service: Endoscopy;  Laterality: N/A;   CORONARY ARTERY BYPASS GRAFT     LIMA-D2, SVG-OM, SeqSVG-D1-LAD   CORONARY STENT INTERVENTION N/A 11/12/2020   Procedure: CORONARY STENT INTERVENTION;  Surgeon: Wellington Hampshire, MD;  Location: [place] CV LAB;  Service: Cardiovascular;  Laterality: N/A;   EYE SURGERY     IABP INSERTION N/A 11/11/2020   Procedure: IABP Insertion;  Surgeon: Nelva Bush, MD;  Location: [place] CV LAB;  Service: Cardiovascular;  Laterality: N/A;   IR STENT PLACEMENT ANTE CAROTID INC ANGIO     KNEE SURGERY     RIGHT/LEFT HEART CATH AND CORONARY/GRAFT ANGIOGRAPHY  N/A 11/11/2020   Procedure: RIGHT/LEFT HEART CATH AND CORONARY/GRAFT ANGIOGRAPHY;  Surgeon: Nelva Bush, MD;  Location: [place] CV LAB;  Service: Cardiovascular;  Severe native CAD: 90% dLMCA-100% mLAD/100% ost LCx CTO w/ Severe diffuse RCA disease. Widely patent LIMA-D1. Patent sequential SVG-D2-LAD with 99% mid graft & 90% LIMA-D2 @ anastomosis. previously stented SVG-OM - CTO. Mildly   TRANSURETHRAL RESECTION OF PROSTATE  1994   URINARY SPHINCTER IMPLANT  1995   Social History:  reports that he quit smoking about 18 years ago. His  smoking use included cigarettes. He has a 30.00 pack-year smoking history. He has never used smokeless tobacco. He reports that he does not drink alcohol and does not use drugs.  Allergies  Allergen Reactions   Butorphanol Itching   Morphine Other (See Comments)    Redness around IV site   Pedi-Pre Tape Spray [Wound Dressing Adhesive] Itching   Simvastatin Other (See Comments)    "Destroyed my muscles"   Statins Other (See Comments)    "Destroyed my muscles"    Family History  Problem Relation Age of Onset   Heart Problems Mother    Heart Problems Father     Prior to Admission medications   Medication Sig Start Date End Date Taking? Authorizing Provider  albuterol (VENTOLIN HFA) 108 (90 Base) MCG/ACT inhaler Inhale 2 puffs into the lungs every 6 (six) hours as needed for wheezing or shortness of breath. 09/25/21   Virginia Crews, MD  allopurinol (ZYLOPRIM) 300 MG tablet TAKE 1 TABLET(300 MG) BY MOUTH DAILY 10/23/21   Virginia Crews, MD  aspirin EC 81 MG tablet Take 81 mg by mouth daily. Swallow whole.    [provider]  blood glucose meter kit and supplies Dispense based on patient and insurance preference. Use up to four times daily as directed. (FOR ICD-10 E10.9, E11.9). check blood glucose fasting and before meals four times daily. 04/18/20   Flinchum, Kelby Aline, FNP  Budeson-Glycopyrrol-Formoterol (BREZTRI AEROSPHERE) 160-9-4.8 MCG/ACT AERO Inhale 2 puffs into the lungs 2 (two) times daily. 01/27/21   Parrett, Fonnie Mu, NP  Cholecalciferol (VITAMIN D3 PO) Take 4,000 Units by mouth.    [provider]  Coenzyme Q10 (CO Q-10) 400 MG CAPS Take by mouth daily at 6 (six) AM.    [provider]  desoximetasone (TOPICORT) 0.25 % cream Apply 1 application. topically 2 (two) times daily. 08/06/21   Virginia Crews, MD  empagliflozin (JARDIANCE) 25 MG TABS tablet Take 1 tablet (25 mg total) by mouth daily before breakfast. 08/07/21   Brita Romp, Dionne Bucy, MD  escitalopram (LEXAPRO) 20 MG tablet TAKE 1 TABLET(20 MG) BY MOUTH DAILY 10/15/21   Bacigalupo, Dionne Bucy, MD  fenofibrate (TRICOR) 145 MG tablet Take 1 tablet (145 mg total) by mouth daily. 07/24/21   Kate Sable, MD  gabapentin (NEURONTIN) 300 MG capsule Take 1 capsule (300 mg total) by mouth 2 (two) times daily AND 2 capsules (600 mg total) at bedtime. 01/29/21   Bacigalupo, Dionne Bucy, MD  glimepiride (AMARYL) 1 MG tablet TAKE 1 TABLET(1 MG) BY MOUTH DAILY WITH BREAKFAST 10/12/21   Bacigalupo, Dionne Bucy, MD  HYDROcodone-acetaminophen (NORCO/VICODIN) 5-325 MG tablet Take 1 tablet by mouth every 6 (six) hours as needed for moderate pain. 09/29/21   Jerrol Banana., MD  lisinopril (ZESTRIL) 20 MG tablet Take 1.5 tablets (30 mg total) by mouth daily. 10/26/21   Kate Sable, MD  lovastatin (MEVACOR) 40 MG tablet TAKE 1 TABLET(40 MG) BY  MOUTH DAILY 08/13/21   Bacigalupo, Dionne Bucy, MD  nitroGLYCERIN (NITROSTAT) 0.4 MG SL tablet Place 1 tablet (0.4 mg total) under the tongue every 5 (five) minutes as needed for chest pain. 10/26/21 01/24/22  Kate Sable, MD  Omega-3 Fatty Acids (FISH OIL) 1200 MG CAPS Take by mouth daily at 6 (six) AM.    [provider]  prasugrel (EFFIENT) 10 MG TABS tablet Take 1 tablet (10 mg total) by mouth daily. 04/24/21   Kate Sable, MD  torsemide (DEMADEX) 20 MG tablet Take 1 tablet (20 mg total) by mouth daily as needed. 09/28/21   Kate Sable, MD  traZODone (DESYREL) 150 MG tablet Take 1 tablet (150 mg total) by mouth at bedtime. 04/16/21   Virginia Crews, MD    Physical Exam: Vitals:   10/28/21 1933 10/28/21 2030 10/28/21 2100 10/28/21 2336  BP: (!) 208/64 (!) 200/62 (!) 195/60 (!) 190/65  Pulse: (!) 56 62 (!) 59 (!) 59  Resp: (!) 24 16 (!) 21 (!) 21  Temp: 97.9 F (36.6 C)   98.1 F (36.7 C)  TempSrc: Oral   Oral  SpO2: (!) 87% 95% 96% 96%   Physical Exam Vitals and nursing note reviewed.  Constitutional:       General: He is not in acute distress.    Interventions: Nasal cannula in place.     Comments: Conversational dyspnea, speaking in 2 word sentences, tachypneic  HENT:     Head: Normocephalic and atraumatic.  Cardiovascular:     Rate and Rhythm: Regular rhythm. Bradycardia present.     Heart sounds: Normal heart sounds.  Pulmonary:     Effort: Tachypnea present.     Breath sounds: Normal breath sounds.  Abdominal:     Palpations: Abdomen is soft.     Tenderness: There is no abdominal tenderness.  Neurological:     Mental Status: Mental status is at baseline.     Labs on Admission: I have personally reviewed following labs and imaging studies  CBC: Recent Labs  Lab 10/28/21 1943  WBC 7.5  HGB 15.3  HCT 48.7  MCV 93.5  PLT 585   Basic Metabolic Panel: Recent Labs  Lab 10/28/21 1943  NA 139  K 4.9  CL 104  CO2 28  GLUCOSE 131*  BUN 38*  CREATININE 2.79*  CALCIUM 9.2   GFR: Estimated Creatinine Clearance: 30.4 mL/min (A) (by C-G formula based on SCr of 2.79 mg/dL (H)). Liver Function Tests: No results for input(s): "AST", "ALT", "ALKPHOS", "BILITOT", "PROT", "ALBUMIN" in the last 168 hours. No results for input(s): "LIPASE", "AMYLASE" in the last 168 hours. No results for input(s): "AMMONIA" in the last 168 hours. Coagulation Profile: No results for input(s): "INR", "PROTIME" in the last 168 hours. Cardiac Enzymes: No results for input(s): "CKTOTAL", "CKMB", "CKMBINDEX", "TROPONINI" in the last 168 hours. BNP (last 3 results) Recent Labs    09/17/21 1547  PROBNP 715*   HbA1C: Recent Labs    10/27/21 1356  HGBA1C 7.7*   CBG: No results for input(s): "GLUCAP" in the last 168 hours. Lipid Profile: No results for input(s): "CHOL", "HDL", "LDLCALC", "TRIG", "CHOLHDL", "LDLDIRECT" in the last 72 hours. Thyroid Function Tests: No results for input(s): "TSH", "T4TOTAL", "FREET4", "T3FREE", "THYROIDAB" in the last 72 hours. Anemia Panel: No results for  input(s): "VITAMINB12", "FOLATE", "FERRITIN", "TIBC", "IRON", "RETICCTPCT" in the last 72 hours. Urine analysis:    Component Value Date/Time   BILIRUBINUR negative 04/18/2020 1349   PROTEINUR Positive (A) 04/18/2020  1349   UROBILINOGEN 0.2 04/18/2020 1349   NITRITE negative 04/18/2020 1349   LEUKOCYTESUR Negative 04/18/2020 1349    Radiological Exams on Admission: DG Chest Port 1 View  Result Date: 10/28/2021 CLINICAL DATA:  Chest pain and shoulder pain, initial encounter EXAM: PORTABLE CHEST 1 VIEW COMPARISON:  01/28/2021 FINDINGS: Cardiac shadow is stable. Postsurgical changes are again seen. Vascular congestion is noted with very mild interstitial edema. The overall appearance is slightly worse than that seen on the prior study. Chronic blunting of the right costophrenic angle is noted. No bony abnormality is seen. IMPRESSION: Changes of mild CHF. Electronically Signed   By: Inez Catalina M.D.   On: 10/28/2021 20:02     Data Reviewed: Relevant notes from primary care and specialist visits, past discharge summaries as available in EHR, including Care Everywhere. Prior diagnostic testing as pertinent to current admission diagnoses Updated medications and problem lists for reconciliation ED course, including vitals, labs, imaging, treatment and response to treatment Triage notes, nursing and pharmacy notes and ED provider's notes Notable results as noted in HPI   Assessment and Plan: * Unstable angina ([place]) Hx of CAD/CABG x 4 2007, PCI x 2 2018, PCI x 1 SVG-LAD/Diagonal 2022 Nitroglycerin infusion to titrate to pain Continue home aspirin, lovastatin, lisinopril Possibly triggered by hypertensive emergency Continue to trend troponins Low threshold for initiation of heparin (patient would like to hold off for now unless troponin trends) Cardiology consult to decide on risk stratification  Hypertensive emergency Systolic in the 614E on arrival Expecting reduction with nitroglycerin  infusion Continue lisinopril 30.  Also getting IV Lasix and nitroglycerin  Acute renal failure superimposed on stage 3a chronic kidney disease (HCC) Creatinine 2.79 up from baseline of 1.7 Likely secondary to medication and probably mild dehydration Continue to monitor, in view of ongoing diuretic treatment  Acute on chronic diastolic CHF (congestive heart failure) (HCC) Symptomatic for shortness of breath and BNP elevated to near 300 with mild CHF on chest x-ray IV Lasix and continue home lisinopril Daily weights with intake and output monitoring We will get echocardiogram  Obesity (BMI 30-39.9) BMI 39.64.  Complicating factor to overall prognosis and care in the setting of multiple comorbidities  Acute respiratory failure with hypoxia ([place]) Likely multifactorial related to acute CHF with underlying OSA and COPD Patient was tachypneic on arrival speaking in short sentences with O2 sat 87% on room air Supplemental oxygen at 2 L and wean as tolerated  OSA (obstructive sleep apnea) CPAP nightly  Controlled type 2 diabetes mellitus with diabetic polyneuropathy, without long-term current use of insulin (HCC) Sliding scale insulin coverage Continue home Jardiance.  Will hold Amaryl  Chronic obstructive pulmonary disease (HCC) Not acutely exacerbated Continue home inhalers with DuoNebs as needed        DVT prophylaxis: Lovenox  Consults: cardiology  Advance Care Planning: dull code  Family Communication: none  Disposition Plan: Back to previous home environment  Severity of Illness: The appropriate patient status for this patient is INPATIENT. Inpatient status is judged to be reasonable and necessary in order to provide the required intensity of service to ensure the patient's safety. The patient's presenting symptoms, physical exam findings, and initial radiographic and laboratory data in the context of their chronic comorbidities is felt to place them at high risk for  further clinical deterioration. Furthermore, it is not anticipated that the patient will be medically stable for discharge from the hospital within 2 midnights of admission.   * I certify that at  the point of admission it is my clinical judgment that the patient will require inpatient hospital care spanning beyond 2 midnights from the point of admission due to high intensity of service, high risk for further deterioration and high frequency of surveillance required.*  Author: Athena Masse, MD 10/29/2021 12:26 AM  For on call review [email or web address].

## 2021-10-29 NOTE — Progress Notes (Signed)
*  PRELIMINARY RESULTS* Echocardiogram 2D Echocardiogram has been performed.  Tony Williams 10/29/2021, 12:31 PM

## 2021-10-29 NOTE — Assessment & Plan Note (Signed)
Creatinine 2.79 up from baseline of 1.7 Likely secondary to medication and probably mild dehydration Continue to monitor, in view of ongoing diuretic treatment

## 2021-10-30 ENCOUNTER — Inpatient Hospital Stay: Payer: Medicare Other

## 2021-10-30 ENCOUNTER — Inpatient Hospital Stay (HOSPITAL_COMMUNITY): Payer: Medicare Other

## 2021-10-30 DIAGNOSIS — Z955 Presence of coronary angioplasty implant and graft: Secondary | ICD-10-CM

## 2021-10-30 DIAGNOSIS — I200 Unstable angina: Secondary | ICD-10-CM

## 2021-10-30 DIAGNOSIS — J449 Chronic obstructive pulmonary disease, unspecified: Secondary | ICD-10-CM | POA: Diagnosis not present

## 2021-10-30 DIAGNOSIS — D649 Anemia, unspecified: Secondary | ICD-10-CM | POA: Diagnosis not present

## 2021-10-30 DIAGNOSIS — J9601 Acute respiratory failure with hypoxia: Secondary | ICD-10-CM

## 2021-10-30 DIAGNOSIS — I161 Hypertensive emergency: Secondary | ICD-10-CM

## 2021-10-30 DIAGNOSIS — N1831 Chronic kidney disease, stage 3a: Secondary | ICD-10-CM

## 2021-10-30 LAB — URINALYSIS, COMPLETE (UACMP) WITH MICROSCOPIC
Bacteria, UA: NONE SEEN
Bilirubin Urine: NEGATIVE
Glucose, UA: >=500 mg/dL — AB
Ketones, ur: NEGATIVE mg/dL
Leukocytes,Ua: NEGATIVE
Nitrite: NEGATIVE
Protein, ur: 100 mg/dL — AB
Specific Gravity, Urine: 1.015 (ref 1.005–1.030)
Squamous Epithelial / HPF: NONE SEEN (ref 0–5)
pH: 5 (ref 5.0–8.0)

## 2021-10-30 LAB — NM MYOCAR MULTI W/SPECT W/WALL MOTION / EF
Estimated workload: 1
Exercise duration (min): 0 min
Exercise duration (sec): 0 s
LV dias vol: 159 mL (ref 62–150)
LV sys vol: 54 mL
MPHR: 145 {beats}/min
Nuc Stress EF: 66 %
Peak HR: 81 {beats}/min
Percent HR: 55 %
Rest HR: 64 {beats}/min
Rest Nuclear Isotope Dose: 10.5 mCi
SDS: 3
SRS: 15
SSS: 18
Stress Nuclear Isotope Dose: 32.3 mCi
TID: 0.81

## 2021-10-30 LAB — GLUCOSE, CAPILLARY
Glucose-Capillary: 165 mg/dL — ABNORMAL HIGH (ref 70–99)
Glucose-Capillary: 129 mg/dL — ABNORMAL HIGH (ref 70–99)
Glucose-Capillary: 147 mg/dL — ABNORMAL HIGH (ref 70–99)
Glucose-Capillary: 120 mg/dL — ABNORMAL HIGH (ref 70–99)
Glucose-Capillary: 127 mg/dL — ABNORMAL HIGH (ref 70–99)
Glucose-Capillary: 245 mg/dL — ABNORMAL HIGH (ref 70–99)

## 2021-10-30 LAB — BASIC METABOLIC PANEL
Anion gap: 8 (ref 5–15)
BUN: 45 mg/dL — ABNORMAL HIGH (ref 8–23)
CO2: 27 mmol/L (ref 22–32)
Calcium: 9 mg/dL (ref 8.9–10.3)
Chloride: 102 mmol/L (ref 98–111)
Creatinine, Ser: 3 mg/dL — ABNORMAL HIGH (ref 0.61–1.24)
GFR, Estimated: 21 mL/min — ABNORMAL LOW (ref >60)
Glucose, Bld: 105 mg/dL — ABNORMAL HIGH (ref 70–99)
Potassium: 4.8 mmol/L (ref 3.5–5.1)
Sodium: 137 mmol/L (ref 135–145)

## 2021-10-30 LAB — LIPOPROTEIN A (LPA): Lipoprotein (a): 195.3 nmol/L — ABNORMAL HIGH (ref <75.0)

## 2021-10-30 MED ORDER — HYDROCODONE-ACETAMINOPHEN 5-325 MG PO TABS
1.0000 | ORAL_TABLET | Freq: Four times a day (QID) | ORAL | Status: DC | PRN
  Administered 2021-10-30: 1 via ORAL
  Filled 2021-10-30: qty 1

## 2021-10-30 MED ORDER — ESCITALOPRAM OXALATE 10 MG PO TABS
20.0000 mg | ORAL_TABLET | Freq: Every day | ORAL | Status: DC
  Administered 2021-10-31: 20 mg via ORAL
  Filled 2021-10-30: qty 2

## 2021-10-30 MED ORDER — CALCIUM CARBONATE ANTACID 500 MG PO CHEW
1.0000 | CHEWABLE_TABLET | Freq: Three times a day (TID) | ORAL | Status: DC | PRN
  Administered 2021-10-30: 200 mg via ORAL
  Filled 2021-10-30: qty 1

## 2021-10-30 MED ORDER — PRASUGREL HCL 10 MG PO TABS
10.0000 mg | ORAL_TABLET | Freq: Every day | ORAL | Status: DC
  Administered 2021-10-30 – 2021-10-31 (×2): 10 mg via ORAL
  Filled 2021-10-30 (×2): qty 1

## 2021-10-30 MED ORDER — AMLODIPINE BESYLATE 5 MG PO TABS
5.0000 mg | ORAL_TABLET | Freq: Every day | ORAL | Status: DC
  Administered 2021-10-30 – 2021-10-31 (×2): 5 mg via ORAL
  Filled 2021-10-30 (×2): qty 1

## 2021-10-30 MED ORDER — TECHNETIUM TC 99M TETROFOSMIN IV KIT
32.3200 | PACK | Freq: Once | INTRAVENOUS | Status: AC | PRN
  Administered 2021-10-30: 32.32 via INTRAVENOUS

## 2021-10-30 MED ORDER — TECHNETIUM TC 99M TETROFOSMIN IV KIT
10.0000 | PACK | Freq: Once | INTRAVENOUS | Status: AC
  Administered 2021-10-30: 10.48 via INTRAVENOUS

## 2021-10-30 MED ORDER — REGADENOSON 0.4 MG/5ML IV SOLN
0.4000 mg | Freq: Once | INTRAVENOUS | Status: AC
  Administered 2021-10-30: 0.4 mg via INTRAVENOUS

## 2021-10-30 MED ORDER — GABAPENTIN 300 MG PO CAPS
300.0000 mg | ORAL_CAPSULE | Freq: Two times a day (BID) | ORAL | Status: DC
  Administered 2021-10-30 – 2021-10-31 (×2): 300 mg via ORAL
  Filled 2021-10-30 (×2): qty 1

## 2021-10-30 MED ORDER — TRAZODONE HCL 50 MG PO TABS
150.0000 mg | ORAL_TABLET | Freq: Every day | ORAL | Status: DC
  Administered 2021-10-30: 150 mg via ORAL
  Filled 2021-10-30: qty 1

## 2021-10-30 NOTE — Progress Notes (Addendum)
Cardiology Progress Note   Patient Name: Tony Williams Date of Encounter: 10/30/2021  Primary Cardiologist: Kate Sable, MD  Subjective   Last night, noted recurrent throbbing in his left chest, neck, and left upper arm that worsened w/ turning his head to the left, lifting his left arm, and other upper body position changes.  For myoview this AM.  Inpatient Medications    Scheduled Meds:  amLODipine  2.5 mg Oral Daily   aspirin EC  81 mg Oral Daily   enoxaparin (LOVENOX) injection  30 mg Subcutaneous Q24H   ezetimibe  10 mg Oral Daily   fenofibrate  160 mg Oral Daily   insulin aspart  0-20 Units Subcutaneous Q4H   pravastatin  40 mg Oral q1800   technetium tetrofosmin  10 millicurie Intravenous Once   Continuous Infusions:  PRN Meds: acetaminophen, albuterol, HYDROmorphone (DILAUDID) injection, ondansetron (ZOFRAN) IV   Vital Signs    Vitals:   10/30/21 0306 10/30/21 0354 10/30/21 0500 10/30/21 0817  BP: (!) 148/62 138/67  (!) 155/56  Pulse:  63  63  Resp:  19  20  Temp:  98.3 F (36.8 C)  98.3 F (36.8 C)  TempSrc:  Oral  Oral  SpO2: 98% 95%  93%  Weight:   123.6 kg    No intake or output data in the 24 hours ending 10/30/21 0923 Filed Weights   10/30/21 0500  Weight: 123.6 kg    Physical Exam   GEN: Obese, in no acute distress.  HEENT: Grossly normal.  Neck: Supple, no masses/bruits.  Difficult to gauge JVP 2/2 girth. Cardiac: RRR, no murmurs, rubs, or gallops. No clubbing, cyanosis, edema.  Radials 2+, DP/PT 2+ and equal bilaterally.  Respiratory:  Respirations regular and unlabored, diminished breath sounds throughout. GI: Obese, soft, nontender, nondistended, BS + x 4. MS: no deformity or atrophy. Skin: warm and dry, no rash. Neuro:  Strength and sensation are intact. Psych: AAOx3.  Normal affect.  Labs    Chemistry Recent Labs  Lab 10/28/21 1943 10/30/21 0555  NA 139 137  K 4.9 4.8  CL 104 102  CO2 28 27  GLUCOSE 131* 105*   BUN 38* 45*  CREATININE 2.79* 3.00*  CALCIUM 9.2 9.0  GFRNONAA 23* 21*  ANIONGAP 7 8     Hematology Recent Labs  Lab 10/28/21 1943  WBC 7.5  RBC 5.21  HGB 15.3  HCT 48.7  MCV 93.5  MCH 29.4  MCHC 31.4  RDW 15.2  PLT 254    Cardiac Enzymes  Recent Labs  Lab 10/28/21 1943 10/28/21 2124 10/29/21 0055 10/29/21 0509  TROPONINIHS 60* 62* 71* 63*      BNP    Component Value Date/Time   BNP 238.3 (H) 10/28/2021 1943    ProBNP    Component Value Date/Time   PROBNP 715 (H) 09/17/2021 1547   Lipids  Lab Results  Component Value Date   CHOL 200 07/22/2021   HDL 33 (L) 07/22/2021   LDLCALC 90 07/22/2021   TRIG 384 (H) 07/22/2021   CHOLHDL 6.1 07/22/2021    HbA1c  Lab Results  Component Value Date   HGBA1C 7.7 (A) 10/27/2021    Radiology    DG Chest Port 1 View  Result Date: 10/30/2021 CLINICAL DATA:  Shortness of breath EXAM: PORTABLE CHEST 1 VIEW COMPARISON:  10/28/2021 FINDINGS: Cardiac shadow is enlarged in size. Postsurgical changes are again noted. Chronic blunting of the right costophrenic angle is noted. Mild central vascular congestion is  noted similar to that seen on the prior exam. No new focal infiltrate is seen. IMPRESSION: Mild changes of CHF, stable from the prior study. Electronically Signed   By: Inez Catalina M.D.   On: 10/30/2021 03:49   DG Chest Port 1 View  Result Date: 10/28/2021 CLINICAL DATA:  Chest pain and shoulder pain, initial encounter EXAM: PORTABLE CHEST 1 VIEW COMPARISON:  01/28/2021 FINDINGS: Cardiac shadow is stable. Postsurgical changes are again seen. Vascular congestion is noted with very mild interstitial edema. The overall appearance is slightly worse than that seen on the prior study. Chronic blunting of the right costophrenic angle is noted. No bony abnormality is seen. IMPRESSION: Changes of mild CHF. Electronically Signed   By: Inez Catalina M.D.   On: 10/28/2021 20:02    Telemetry    RSR - Personally  Reviewed  Cardiac Studies   Cardiac Catheterization and Percutaneous Coronary Intervention 8.30.2022  Diagnostic Dominance: Right  Intervention   _____________  2D Echocardiogram 8.17.2023   1. Left ventricular ejection fraction, by estimation, is 50 to 55%. The  left ventricle has low normal function. Select images concerning for  inferior wall hypokinesis. Left ventricular diastolic parameters are  consistent with Grade I diastolic  dysfunction (impaired relaxation).   2. Right ventricular systolic function is normal. The right ventricular  size is normal.   3. Left atrial size was mildly dilated.   4. The mitral valve was not well visualized. Mild mitral valve  regurgitation. No evidence of mitral stenosis.   5. The aortic valve was not well visualized. Aortic valve regurgitation  is mild. No aortic stenosis is present.   6. The inferior vena cava is normal in size with greater than 50%  respiratory variability, suggesting right atrial pressure of 3 mmHg.  _____________   Patient Profile     76 y.o. male with a history of with a history of CAD status post four-vessel bypass in 2007 with subsequent interventions, hypertension, hyperlipidemia (statin intolerant), obesity, diabetes, remote tobacco abuse, COPD, sleep apnea (not using CPAP), and stage III chronic kidney disease, who was admitted 8/16 w/ hypertensive urgency, chest pain, and demand ischemia (HsTrops 60  62  71  63).  Assessment & Plan    1.  Demand Ischemia/CAD:  Patient with a history of CAD status post CABG in 2007 with recent non-STEMI in August 2022 w/ stenting of the sequential vein graft to the diagonal and LAD secondary to separate lesions proximal to insertion sites on both vessels.  Presented 8/16 w/ HTN urgency and chest/neck/L shoulder pain.  HsTrop mildly elevated w/ flat trend (HsTrops 60  62  71  63).  He had recurrent c/p last night and this AM, described as throbbing in his L chest/neck/shoulder,  which improved w/ dilaudid overnight.  Discomfort is worse w/ position changes, turning his head to the L and lifting his L arm.  For lexiscan myoview this AM.  Poor cath candidate 2/2 acute on chronic stage III kidney dzs w/ creat of 3.0 this AM.  Cont asa, effient, and statin.  2.  HTN urgency:  BP 208/64 on arrival.  H/o labile HTN previously.  Home dose of lisinopril on hold in setting of progressive renal failure.  Amlodipine 2.'5mg'$  daily added yesterday and pressures 130's to 150's this AM.  Will increase amlodipine to '5mg'$  daily.  3.  HL:  On lovastatin and fenofibrate @ home w/ LDL of 90 in 07/2021.  Zetia added 8/16.  4.  Chronic HFpEF: Echo  8/17 w/ EF 50-55% and GrI DD.  Volume difficult to gauge 2/2 body habitus but does not appear to be markedly volume overloaded.  He did receive Lasix in the ED and this has been held since, in the setting of rising creat.  Following stress test this AM, he c/o dyspnea.  O2 sat 93% on RA.  Diminished breath sounds on exam.  Will obtain PA and LAT CXR following myoview imaging.  I suspect that he will require resumption of lasix, in which case will need assistance from nephrology team.  Titrating amlodipine for BP.  5.  Acute on chronic stage III kidney dzs:  Creat higher this AM @ 3.0.  Acei/diruetic on hold.  Will hold SGLT2i as well.  Sees nephrology as outpt - would likely benefit from inpt eval.  6.  DMII:  per IM.  Signed, Murray Hodgkins, NP  10/30/2021, 9:23 AM    For questions or updates, please contact   Please consult [email or web address] for contact info under Cardiology/STEMI.

## 2021-10-30 NOTE — Care Management Important Message (Addendum)
Important Message  Patient Details  Name: Tony Williams MRN: [id number] Date of Birth: 1946/03/10   Medicare Important Message Given:  N/A - LOS <3 / Initial given by admissions     Dannette Barbara 10/30/2021, 8:50 AM

## 2021-10-30 NOTE — Progress Notes (Signed)
Mobility Specialist - Progress Note    10/30/21 1345  Mobility  Activity Ambulated independently in hallway;Stood at bedside;Dangled on edge of bed  Level of Assistance Independent  Distance Ambulated (ft) 100 ft  Activity Response Tolerated well  $Mobility charge 1 Mobility   Pt supine in bed on RA upon arrival. Pt STS and ambulates in hallway indep. X-ray NT catches [place] in hallway and take him to X-ray.   Gretchen Short  Mobility Specialist  10/30/21 1:46 PM

## 2021-10-30 NOTE — Progress Notes (Signed)
Pt refused to wear cardiac monitor. Dr. Si Raider aware.

## 2021-10-30 NOTE — TOC CM/SW Note (Signed)
  Transition of Care [facility]) Screening Note   Patient Details  Name: Tony Williams Date of Birth: 01-01-1946   Transition of Care [facility]) CM/SW Contact:    Candie Chroman, LCSW Phone Number: 10/30/2021, 2:59 PM    Transition of Care Department [facility]) has reviewed patient and no TOC needs have been identified at this time. We will continue to monitor patient advancement through interdisciplinary progression rounds. If new patient transition needs arise, please place a TOC consult.

## 2021-10-30 NOTE — Progress Notes (Addendum)
PROGRESS NOTE    Tony Williams  DUK:[id number] DOB: 04-27-1945 DOA: 10/28/2021 PCP: Virginia Crews, MD     Brief Narrative:   From admission h and p Tony Williams is a 76 y.o. male with medical history significant for HTN, DM, diastolic CHF, COPD not on home oxygen, OSA on CPAP, CAD s/p CABG 2007, PCI with stent times 04/2005 and stent times 04/03/2020 who presents to the ED with left-sided chest pain radiating to the left shoulder that started in the afternoon of 8/17 while sitting in his recliner, associated with shortness of breath.  Patient states he has been running high blood pressures of late and saw his cardiologist 2 days prior and had his lisinopril increased from 20 mg to 30 mg.  When his chest pain and shortness of breath started he checked his blood pressure at home and the systolic was in the 376E and he decided to come in.  He denied nausea, vomiting or diaphoresis. ED course and data review: Afebrile pulse 56, respirations 24 with BP 208/64 and O2 sat 87% on room air.  Labs significant for troponin of 60-62 and BNP 238.  Creatinine 2.79, up from baseline of 1.7 about 7 months prior.  EKG, personally viewed and interpreted showing sinus bradycardia at 54 with T wave inversion inferior leads.  Chest x-ray showing changes of mild CHF.  Patient was treated with chewable aspirin, IV Lasix 60 mg and given a dose of fentanyl.  Hospitalist consulted for admission. While in the ED he continued to have chest pain described as sharpness radiating to the left shoulder.  He was started on a nitroglycerin infusion to titrate to pain. Discussed with patient about starting heparin given his cardiac history.  He opted to wait for another troponin and if elevated he would be open to initiating heparin infusion.  Due to O2 sat of 87% on room air on arrival and tachypnea and shortness of breath, conversational dyspnea he was started on O2 at 2 L.     Assessment & Plan:   Principal Problem:    Unstable angina (HCC) Active Problems:   Hypertensive emergency   Acute on chronic diastolic CHF (congestive heart failure) (HCC)   Acute renal failure superimposed on stage 3a chronic kidney disease (HCC)   Chronic obstructive pulmonary disease (HCC)   Controlled type 2 diabetes mellitus with diabetic polyneuropathy, without long-term current use of insulin (HCC)   S/P primary angioplasty with coronary stent   Stage 3a chronic kidney disease (HCC)   OSA (obstructive sleep apnea)   Acute respiratory failure with hypoxia (HCC)   Obesity (BMI 30-39.9)   Hx of CABG  # CAD with possible angina Hx CABG x4, PCI in 2018 and 2022. Presenting with hypertensive urgency and chest pain. BP much improved, chest pan also improved. Started on nitroglycerine but didn't tolerate 2/2 headache. Troponins mildly elevated and flat. Cardiology consulted. Nuclear stress test today with fixed defect, cardiology does not think needs cath - continuing home aspirin, statin, and effient  # HTN # Hypertensive urgency BP now much improved - home lisinopril held 2/2 aki - amlodipine started, increased to 5 today by cardiology  # HFpEF Appears euvolemic on exam. On torsemide eod at home. Here received lasix 60 IV x1 in the ED. Dyspneic during stress test, CXR this afternoon does show mild pulmonary edema, though asymptomatic currently. - diuresis per cardiology  # AKI on ckd stage 3b Baseline cr variable but approximately 2, here 2.79 on presentation, increased to  3 today. Hard to tell whether he requires fluids or diuresis. Hasn't received IV contrast here - hold lisinopril - would involve nephrology tomorrow if kidney function fails to improve - will start recording urine output, check ua  # Obesity Noted  # T2DM Here glucose appropriate - ssi - hold home jardiance given AKI   # OSA - cpap qhs    DVT prophylaxis: lovenox Code Status: full Family Communication: children updated telephonically  8/18 Level of care: Progressive Status is: Inpatient Remains inpatient appropriate because: need for inpatient evaluation    Consultants:  cardiology  Procedures: none  Antimicrobials:  none    Subjective: Dyspnea and chest pain during myoview, now resolved  Objective: Vitals:   10/30/21 0354 10/30/21 0500 10/30/21 0817 10/30/21 1238  BP: 138/67  (!) 155/56 (!) 159/63  Pulse: 63  63 68  Resp: '19  20 18  '$ Temp: 98.3 F (36.8 C)  98.3 F (36.8 C) 97.6 F (36.4 C)  TempSrc: Oral  Oral Oral  SpO2: 95%  93% 95%  Weight:  123.6 kg     No intake or output data in the 24 hours ending 10/30/21 1459  Filed Weights   10/30/21 0500  Weight: 123.6 kg    Examination:  General exam: Appears calm and comfortable  Respiratory system: few faint rales scattered Cardiovascular system: S1 & S2 heard, RRR. No JVD, murmurs, rubs, gallops or clicks. No pedal edema. Gastrointestinal system: Abdomen is obese, soft and nontender. No organomegaly or masses felt. Normal bowel sounds heard. Central nervous system: Alert and oriented. No focal neurological deficits. Extremities: Symmetric 5 x 5 power. Skin: No rashes, lesions or ulcers Psychiatry: Judgement and insight appear normal. Mood & affect appropriate.     Data Reviewed: I have personally reviewed following labs and imaging studies  CBC: Recent Labs  Lab 10/28/21 1943  WBC 7.5  HGB 15.3  HCT 48.7  MCV 93.5  PLT 027   Basic Metabolic Panel: Recent Labs  Lab 10/28/21 1943 10/30/21 0555  NA 139 137  K 4.9 4.8  CL 104 102  CO2 28 27  GLUCOSE 131* 105*  BUN 38* 45*  CREATININE 2.79* 3.00*  CALCIUM 9.2 9.0   GFR: Estimated Creatinine Clearance: 28 mL/min (A) (by C-G formula based on SCr of 3 mg/dL (H)). Liver Function Tests: No results for input(s): "AST", "ALT", "ALKPHOS", "BILITOT", "PROT", "ALBUMIN" in the last 168 hours. No results for input(s): "LIPASE", "AMYLASE" in the last 168 hours. No results for  input(s): "AMMONIA" in the last 168 hours. Coagulation Profile: No results for input(s): "INR", "PROTIME" in the last 168 hours. Cardiac Enzymes: No results for input(s): "CKTOTAL", "CKMB", "CKMBINDEX", "TROPONINI" in the last 168 hours. BNP (last 3 results) Recent Labs    09/17/21 1547  PROBNP 715*   HbA1C: No results for input(s): "HGBA1C" in the last 72 hours.  CBG: Recent Labs  Lab 10/29/21 2002 10/29/21 2351 10/30/21 0340 10/30/21 0846 10/30/21 1235  GLUCAP 161* 122* 165* 129* 147*   Lipid Profile: No results for input(s): "CHOL", "HDL", "LDLCALC", "TRIG", "CHOLHDL", "LDLDIRECT" in the last 72 hours. Thyroid Function Tests: No results for input(s): "TSH", "T4TOTAL", "FREET4", "T3FREE", "THYROIDAB" in the last 72 hours. Anemia Panel: No results for input(s): "VITAMINB12", "FOLATE", "FERRITIN", "TIBC", "IRON", "RETICCTPCT" in the last 72 hours. Urine analysis:    Component Value Date/Time   BILIRUBINUR negative 04/18/2020 1349   PROTEINUR Positive (A) 04/18/2020 1349   UROBILINOGEN 0.2 04/18/2020 1349   NITRITE negative 04/18/2020  [place] Negative 04/18/2020 1349   Sepsis Labs: '@LABRCNTIP'$ (procalcitonin:4,lacticidven:4)  )No results found for this or any previous visit (from the past 240 hour(s)).       Radiology Studies: DG Chest 2 View  Result Date: 10/30/2021 CLINICAL DATA:  Dyspnea.  Recent chest pain and shortness of breath. EXAM: CHEST - 2 VIEW COMPARISON:  10/30/2021 at 3:40 a.m. and 10/28/2021, 01/28/2021 FINDINGS: Patient is slightly rotated to the right. Sternotomy wires are unchanged. Lungs are adequately inflated and demonstrate mild stable right base opacification partially chronic but slightly worse which may be due to atelectasis or infection. Small amount right pleural fluid. Mild hazy prominence of the perihilar vessels unchanged likely mild vascular congestion. Cardiomediastinal silhouette and remainder of the exam is unchanged. Mild  compression deformity over the thoracolumbar junction unchanged. IMPRESSION: 1. Chronic right base opacification slightly worse which may be due to atelectasis or infection. Small amount right pleural fluid. 2. Suggestion of mild vascular congestion. Electronically Signed   By: Marin Olp M.D.   On: 10/30/2021 14:10   DG Chest Port 1 View  Result Date: 10/30/2021 CLINICAL DATA:  Shortness of breath EXAM: PORTABLE CHEST 1 VIEW COMPARISON:  10/28/2021 FINDINGS: Cardiac shadow is enlarged in size. Postsurgical changes are again noted. Chronic blunting of the right costophrenic angle is noted. Mild central vascular congestion is noted similar to that seen on the prior exam. No new focal infiltrate is seen. IMPRESSION: Mild changes of CHF, stable from the prior study. Electronically Signed   By: Inez Catalina M.D.   On: 10/30/2021 03:49   ECHOCARDIOGRAM COMPLETE  Result Date: 10/29/2021    ECHOCARDIOGRAM REPORT   Patient Name:   Tony Williams Date of Exam: 10/29/2021 Medical Rec #:  [id number]        Height:       70.0 in Accession #:    [id number]       Weight:       276.3 lb Date of Birth:  [birth year]        BSA:          2.395 m Patient Age:    53 years         BP:           132/59 mmHg Patient Gender: M                HR:           55 bpm. Exam Location:  [facility] Procedure: 2D Echo, Color Doppler, Cardiac Doppler and Intracardiac            Opacification Agent Indications:     I50.31 congestive heart failure-Acute Diastolic  History:         Patient has prior history of Echocardiogram examinations, most                  recent 11/16/2020. CAD, Prior CABG, CKD and COPD; Risk                  Factors:Diabetes.  Sonographer:     Charmayne Sheer Referring Phys:  [place] Diagnosing Phys: Ida Rogue MD  Sonographer Comments: Technically challenging study due to limited acoustic windows. Image acquisition challenging due to patient body habitus. IMPRESSIONS  1. Left ventricular ejection fraction, by  estimation, is 50 to 55%. The left ventricle has low normal function. Select images concerning for inferior wall hypokinesis. Left ventricular diastolic parameters are consistent with Grade I diastolic dysfunction (impaired relaxation).  2. Right ventricular systolic function is normal. The right ventricular size is normal.  3. Left atrial size was mildly dilated.  4. The mitral valve was not well visualized. Mild mitral valve regurgitation. No evidence of mitral stenosis.  5. The aortic valve was not well visualized. Aortic valve regurgitation is mild. No aortic stenosis is present.  6. The inferior vena cava is normal in size with greater than 50% respiratory variability, suggesting right atrial pressure of 3 mmHg. FINDINGS  Left Ventricle: Left ventricular ejection fraction, by estimation, is 50 to 55%. The left ventricle has low normal function. The left ventricle has no regional wall motion abnormalities. Definity contrast agent was given IV to delineate the left ventricular endocardial borders. The left ventricular internal cavity size was normal in size. There is no left ventricular hypertrophy. Left ventricular diastolic parameters are consistent with Grade I diastolic dysfunction (impaired relaxation). Right Ventricle: The right ventricular size is normal. No increase in right ventricular wall thickness. Right ventricular systolic function is normal. Left Atrium: Left atrial size was mildly dilated. Right Atrium: Right atrial size was normal in size. Pericardium: There is no evidence of pericardial effusion. Mitral Valve: The mitral valve was not well visualized. Mild mitral valve regurgitation. No evidence of mitral valve stenosis. Tricuspid Valve: The tricuspid valve is not well visualized. Tricuspid valve regurgitation is not demonstrated. No evidence of tricuspid stenosis. Aortic Valve: The aortic valve was not well visualized. Aortic valve regurgitation is mild. No aortic stenosis is present. Aortic  valve mean gradient measures 4.0 mmHg. Aortic valve peak gradient measures 6.2 mmHg. Aortic valve area, by VTI measures 2.60  cm. Pulmonic Valve: The pulmonic valve was not well visualized. Pulmonic valve regurgitation is not visualized. No evidence of pulmonic stenosis. Aorta: The aortic root is normal in size and structure. Venous: The inferior vena cava is normal in size with greater than 50% respiratory variability, suggesting right atrial pressure of 3 mmHg. IAS/Shunts: No atrial level shunt detected by color flow Doppler.  LEFT VENTRICLE PLAX 2D LVIDd:         5.85 cm   Diastology LVIDs:         4.53 cm   LV e' medial:    5.33 cm/s LV PW:         1.20 cm   LV E/e' medial:  17.6 LV IVS:        0.81 cm   LV e' lateral:   9.25 cm/s LVOT diam:     2.00 cm   LV E/e' lateral: 10.1 LV SV:         58 LV SV Index:   24 LVOT Area:     3.14 cm  LEFT ATRIUM           Index LA diam:      4.40 cm 1.84 cm/m LA Vol (A4C): 45.4 ml 18.96 ml/m  AORTIC VALVE AV Area (Vmax):    2.42 cm AV Area (Vmean):   2.14 cm AV Area (VTI):     2.60 cm AV Vmax:           124.00 cm/s AV Vmean:          92.500 cm/s AV VTI:            0.222 m AV Peak Grad:      6.2 mmHg AV Mean Grad:      4.0 mmHg LVOT Vmax:         95.40 cm/s LVOT Vmean:  62.900 cm/s LVOT VTI:          0.184 m LVOT/AV VTI ratio: 0.83  AORTA Ao Root diam: 3.20 cm MITRAL VALVE MV Area (PHT): 2.94 cm    SHUNTS MV Decel Time: 258 msec    Systemic VTI:  0.18 m MV E velocity: 93.60 cm/s  Systemic Diam: 2.00 cm MV A velocity: 60.60 cm/s MV E/A ratio:  1.54 Ida Rogue MD Electronically signed by Ida Rogue MD Signature Date/Time: 10/29/2021/2:24:14 PM    Final    DG Chest Port 1 View  Result Date: 10/28/2021 CLINICAL DATA:  Chest pain and shoulder pain, initial encounter EXAM: PORTABLE CHEST 1 VIEW COMPARISON:  01/28/2021 FINDINGS: Cardiac shadow is stable. Postsurgical changes are again seen. Vascular congestion is noted with very mild interstitial edema. The  overall appearance is slightly worse than that seen on the prior study. Chronic blunting of the right costophrenic angle is noted. No bony abnormality is seen. IMPRESSION: Changes of mild CHF. Electronically Signed   By: Inez Catalina M.D.   On: 10/28/2021 20:02        Scheduled Meds:  amLODipine  5 mg Oral Daily   aspirin EC  81 mg Oral Daily   enoxaparin (LOVENOX) injection  30 mg Subcutaneous Q24H   ezetimibe  10 mg Oral Daily   fenofibrate  160 mg Oral Daily   insulin aspart  0-20 Units Subcutaneous Q4H   pravastatin  40 mg Oral q1800   Continuous Infusions:     LOS: 2 days     Desma Maxim, MD [facility]   If 7PM-7AM, please contact night-coverage [email or web address] Password [facility] 10/30/2021, 2:59 PM

## 2021-10-30 NOTE — Consult Note (Signed)
   Heart Failure Nurse Navigator Note  HFpEF 50 to 55%.  Inferior wall hypokinesis.  Grade 1 diastolic dysfunction.  Mild left atrial enlargement.  Mild mitral regurgitation.  He presented to the emergency room with complaints of left-sided chest pain, shortness of breath and blood pressure noted to be 314 systolic.  Comorbidities:  Morbid obesity Hypertension Diabetes COPD Obstructive sleep apnea not on CPAP Coronary artery disease-CABG/PCI-stents Chronic kidney disease  Medications:  Amlodipine 5 mg daily Aspirin 81 mg daily Zetia 10 mg daily Fenofibrate 160 mg daily Pravastatin 40 mg daily Home torsemide to 40 mg every other day.   Labs:  Sodium 137, potassium 4.8, chloride 102, CO2 27, BUN 45, creatinine 3 was 2.79 on admission Weight is 123.6 kg Intake not documented Output not documented Pressure 159/63   Initial meeting with patient and wife who was at the bedside.  Discussed heart failure and what it means.  Discussed the importance of low-sodium diet.  Patient states at home that he uses a salt substitute but he is unaware if it is made with potassium chloride.  Explained the importance of not using salt substitute that is made with potassium chloride but rather recommend Mrs. Dash or lower is low-salt.  They voiced understanding.  Went over the importance of fluid restriction and sticking with 64 ounces total in a 24-hour period.  He states that he drinks between 1-1/2 and 2 gallons of Crystal light tea a day and 1/2 cup of coffee with breakfast.  Discussed follow-up in the outpatient heart failure clinic for which she has an appointment on September 8 at 1:30 in the afternoon.  He has a 2% no-show which is 1 out of 58 appointments.  They were also given a flyer on ventricle health and explained the program patient states that he does not have a smart phone.  Pricilla Riffle RN [facility]

## 2021-10-31 DIAGNOSIS — I5033 Acute on chronic diastolic (congestive) heart failure: Secondary | ICD-10-CM | POA: Diagnosis not present

## 2021-10-31 DIAGNOSIS — E1142 Type 2 diabetes mellitus with diabetic polyneuropathy: Secondary | ICD-10-CM | POA: Diagnosis not present

## 2021-10-31 DIAGNOSIS — I200 Unstable angina: Secondary | ICD-10-CM | POA: Diagnosis not present

## 2021-10-31 DIAGNOSIS — D649 Anemia, unspecified: Secondary | ICD-10-CM | POA: Diagnosis not present

## 2021-10-31 DIAGNOSIS — N170 Acute kidney failure with tubular necrosis: Secondary | ICD-10-CM | POA: Diagnosis not present

## 2021-10-31 LAB — GLUCOSE, CAPILLARY
Glucose-Capillary: 121 mg/dL — ABNORMAL HIGH (ref 70–99)
Glucose-Capillary: 152 mg/dL — ABNORMAL HIGH (ref 70–99)
Glucose-Capillary: 148 mg/dL — ABNORMAL HIGH (ref 70–99)

## 2021-10-31 LAB — BASIC METABOLIC PANEL
Anion gap: 8 (ref 5–15)
BUN: 48 mg/dL — ABNORMAL HIGH (ref 8–23)
CO2: 25 mmol/L (ref 22–32)
Calcium: 8.9 mg/dL (ref 8.9–10.3)
Chloride: 104 mmol/L (ref 98–111)
Creatinine, Ser: 2.69 mg/dL — ABNORMAL HIGH (ref 0.61–1.24)
GFR, Estimated: 24 mL/min — ABNORMAL LOW (ref >60)
Glucose, Bld: 128 mg/dL — ABNORMAL HIGH (ref 70–99)
Potassium: 4 mmol/L (ref 3.5–5.1)
Sodium: 137 mmol/L (ref 135–145)

## 2021-10-31 MED ORDER — ALLOPURINOL 100 MG PO TABS: 100.0000 mg | ORAL_TABLET | Freq: Every day | ORAL | 2 refills | Status: DC

## 2021-10-31 MED ORDER — AMLODIPINE BESYLATE 5 MG PO TABS: 5.0000 mg | ORAL_TABLET | Freq: Every day | ORAL | 1 refills | Status: DC

## 2021-10-31 NOTE — Discharge Summary (Signed)
Tony Williams QIH:[id number] DOB: 1945/09/29 DOA: 10/28/2021  PCP: Virginia Crews, MD  Admit date: 10/28/2021 Discharge date: 10/31/2021  Time spent: 40 minutes  Recommendations for Outpatient Follow-up:  Pcp f/u 1 week, needs check of kidney function then Cardiology and nephrology f/u     Discharge Diagnoses:  Principal Problem:   Unstable angina ([place]) Active Problems:   Hypertensive emergency   Acute on chronic diastolic CHF (congestive heart failure) ([place])   Acute renal failure superimposed on stage 3a chronic kidney disease ([place])   Chronic obstructive pulmonary disease ([place])   Controlled type 2 diabetes mellitus with diabetic polyneuropathy, without long-term current use of insulin (HCC)   S/P primary angioplasty with coronary stent   Stage 3a chronic kidney disease (HCC)   OSA (obstructive sleep apnea)   Acute respiratory failure with hypoxia (H[facility])   Obesity (BMI 30-39.9)   Hx of CABG   Discharge Condition: stable  Diet recommendation: heart healthy  Filed Weights   10/30/21 0500  Weight: 123.6 kg    History of present illness:  From admission h and p Tony Williams is a 76 y.o. male with medical history significant for HTN, DM, diastolic CHF, COPD not on home oxygen, OSA on CPAP, CAD s/p CABG 2007, PCI with stent times 04/2005 and stent times 04/03/2020 who presents to the ED with left-sided chest pain radiating to the left shoulder that started in the afternoon of 8/17 while sitting in his recliner, associated with shortness of breath.  Patient states he has been running high blood pressures of late and saw his cardiologist 2 days prior and had his lisinopril increased from 20 mg to 30 mg.  When his chest pain and shortness of breath started he checked his blood pressure at home and the systolic was in the 875I and he decided to come in.  He denied nausea, vomiting or diaphoresis. ED course and data review: Afebrile pulse 56, respirations 24 with BP 208/64 and  O2 sat 87% on room air.  Labs significant for troponin of 60-62 and BNP 238.  Creatinine 2.79, up from baseline of 1.7 about 7 months prior.  EKG, personally viewed and interpreted showing sinus bradycardia at 54 with T wave inversion inferior leads.  Chest x-ray showing changes of mild CHF.  Patient was treated with chewable aspirin, IV Lasix 60 mg and given a dose of fentanyl.  Hospitalist consulted for admission. While in the ED he continued to have chest pain described as sharpness radiating to the left shoulder.  He was started on a nitroglycerin infusion to titrate to pain. Discussed with patient about starting heparin given his cardiac history.  He opted to wait for another troponin and if elevated he would be open to initiating heparin infusion.  Due to O2 sat of 87% on room air on arrival and tachypnea and shortness of breath, conversational dyspnea he was started on O2 at 2 L.    Hospital Course:   # CAD with possible angina Hx CABG x4, PCI in 2018 and 2022. Presenting with hypertensive urgency and chest pain. BP much improved, chest pan also improved. Started on nitroglycerine but didn't tolerate 2/2 headache. Troponins mildly elevated and flat. Cardiology consulted. Nuclear stress test with fixed defect, cardiology does not think needs cath, advising continuing home meds   # HTN # Hypertensive urgency BP now much improved - home lisinopril held 2/2 aki - amlodipine started  # Gout Advise decreasing allopurinol given worsening kidney function   # HFpEF Appears euvolemic  on exam. On torsemide eod at home. Here received lasix 60 IV x1 in the ED. Dyspneic during stress test, CXR that afternoon does show mild pulmonary edema, though asymptomatic currently, further diuresis held   # AKI on ckd stage 3b Baseline cr variable but approximately 2, here 2.79 on presentation, increased to 3, improved to 2.69. appears euvolemic - hold lisinopril - pcp f/u 1 week, may need to back off on  diuretic if kidney function does not continue to improve   # Obesity Noted   # T2DM Here glucose appropriate - resume home glimepiride though hypoglycemia precautions provided given aki on ckd - hold home jardiance given AKI on ckd   # OSA - cpap qhs   Procedures: none   Consultations: cardiology  Discharge Exam: Vitals:   10/31/21 0400 10/31/21 0837  BP: (!) 136/48 (!) 137/52  Pulse: 65 (!) 55  Resp: 18 20  Temp: 97.7 F (36.5 C) 98.6 F (37 C)  SpO2: 98% 94%    General exam: Appears calm and comfortable  Respiratory system: few faint rales scattered Cardiovascular system: S1 & S2 heard, RRR. No JVD, murmurs, rubs, gallops or clicks. No pedal edema. Gastrointestinal system: Abdomen is obese, soft and nontender. No organomegaly or masses felt. Normal bowel sounds heard. Central nervous system: Alert and oriented. No focal neurological deficits. Extremities: Symmetric 5 x 5 power. Skin: No rashes, lesions or ulcers Psychiatry: Judgement and insight appear normal. Mood & affect appropriate.   Discharge Instructions   Discharge Instructions     Diet - low sodium heart healthy   Complete by: As directed    Increase activity slowly   Complete by: As directed       Allergies as of 10/31/2021       Reactions   Butorphanol Itching   Morphine Other (See Comments)   Redness around IV site   Pedi-pre Tape Spray [wound Dressing Adhesive] Itching   Simvastatin Other (See Comments)   "Destroyed my muscles"   Statins Other (See Comments)   "Destroyed my muscles"        Medication List     STOP taking these medications    empagliflozin 25 MG Tabs tablet Commonly known as: JARDIANCE   lisinopril 20 MG tablet Commonly known as: ZESTRIL       TAKE these medications    albuterol 108 (90 Base) MCG/ACT inhaler Commonly known as: VENTOLIN HFA Inhale 2 puffs into the lungs every 6 (six) hours as needed for wheezing or shortness of breath.   allopurinol  100 MG tablet Commonly known as: Zyloprim Take 1 tablet (100 mg total) by mouth daily. What changed:  medication strength See the new instructions.   amLODipine 5 MG tablet Commonly known as: NORVASC Take 1 tablet (5 mg total) by mouth daily. Start taking on: November 01, 2021   aspirin EC 81 MG tablet Take 81 mg by mouth daily. Swallow whole.   blood glucose meter kit and supplies Dispense based on patient and insurance preference. Use up to four times daily as directed. (FOR ICD-10 E10.9, E11.9). check blood glucose fasting and before meals four times daily.   Breztri Aerosphere 160-9-4.8 MCG/ACT Aero Generic drug: Budeson-Glycopyrrol-Formoterol Inhale 2 puffs into the lungs 2 (two) times daily.   Co Q-10 400 MG Caps Take by mouth daily at 6 (six) AM.   desoximetasone 0.25 % cream Commonly known as: TOPICORT Apply 1 application. topically 2 (two) times daily.   escitalopram 20 MG tablet Commonly known as:  LEXAPRO TAKE 1 TABLET(20 MG) BY MOUTH DAILY   fenofibrate 145 MG tablet Commonly known as: Tricor Take 1 tablet (145 mg total) by mouth daily.   Fish Oil 1200 MG Caps Take by mouth daily at 6 (six) AM.   gabapentin 300 MG capsule Commonly known as: NEURONTIN Take 1 capsule (300 mg total) by mouth 2 (two) times daily AND 2 capsules (600 mg total) at bedtime.   glimepiride 1 MG tablet Commonly known as: AMARYL TAKE 1 TABLET(1 MG) BY MOUTH DAILY WITH BREAKFAST   HYDROcodone-acetaminophen 5-325 MG tablet Commonly known as: NORCO/VICODIN Take 1 tablet by mouth every 6 (six) hours as needed for moderate pain.   lovastatin 40 MG tablet Commonly known as: MEVACOR TAKE 1 TABLET(40 MG) BY MOUTH DAILY   nitroGLYCERIN 0.4 MG SL tablet Commonly known as: NITROSTAT Place 1 tablet (0.4 mg total) under the tongue every 5 (five) minutes as needed for chest pain.   prasugrel 10 MG Tabs tablet Commonly known as: Effient Take 1 tablet (10 mg total) by mouth daily.    torsemide 20 MG tablet Commonly known as: DEMADEX Take 1 tablet (20 mg total) by mouth daily as needed.   traZODone 150 MG tablet Commonly known as: DESYREL Take 1 tablet (150 mg total) by mouth at bedtime.   VITAMIN D3 PO Take 4,000 Units by mouth.       Allergies  Allergen Reactions   Butorphanol Itching   Morphine Other (See Comments)    Redness around IV site   Pedi-Pre Tape Spray [Wound Dressing Adhesive] Itching   Simvastatin Other (See Comments)    "Destroyed my muscles"   Statins Other (See Comments)    "Destroyed my muscles"    Follow-up Information     Bacigalupo, Dionne Bucy, MD Follow up.   Specialty: Family Medicine Contact information: [address] [place] [address] [phone]         Kate Sable, MD .   Specialties: Cardiology, Radiology Contact information: [place] [address] [phone]         Murlean Iba, MD Follow up.   Specialty: Nephrology Contact information: [place] [address] [phone]                  The results of significant diagnostics from this hospitalization (including imaging, microbiology, ancillary and laboratory) are listed below for reference.    Significant Diagnostic Studies: NM Myocar Multi W/Spect W/Wall Motion / EF  Result Date: 10/30/2021 Pharmacological myocardial perfusion imaging study with no significant  ischemia Moderate-sized region fixed defect in the basal to mid inferior wall, mid anteroseptal region Basal to mid inferior wall hypokinesis, EF estimated at 39% Resting EKG with diffuse nonspecific ST and T wave abnormality, more pronounced with pharmacologic infusion with symptoms of chest discomfort No EKG changes concerning for ischemia at peak stress or in recovery. Moderate risk scan in the setting of fixed perfusion defect Signed, Esmond Plants, MD, Ph.D [facility]   DG Chest 2 View  Result Date:  10/30/2021 CLINICAL DATA:  Dyspnea.  Recent chest pain and shortness of breath. EXAM: CHEST - 2 VIEW COMPARISON:  10/30/2021 at 3:40 a.m. and 10/28/2021, 01/28/2021 FINDINGS: Patient is slightly rotated to the right. Sternotomy wires are unchanged. Lungs are adequately inflated and demonstrate mild stable right base opacification partially chronic but slightly worse which may be due to atelectasis or infection. Small amount right pleural fluid. Mild hazy prominence of the perihilar  vessels unchanged likely mild vascular congestion. Cardiomediastinal silhouette and remainder of the exam is unchanged. Mild compression deformity over the thoracolumbar junction unchanged. IMPRESSION: 1. Chronic right base opacification slightly worse which may be due to atelectasis or infection. Small amount right pleural fluid. 2. Suggestion of mild vascular congestion. Electronically Signed   By: Marin Olp M.D.   On: 10/30/2021 14:10   DG Chest Port 1 View  Result Date: 10/30/2021 CLINICAL DATA:  Shortness of breath EXAM: PORTABLE CHEST 1 VIEW COMPARISON:  10/28/2021 FINDINGS: Cardiac shadow is enlarged in size. Postsurgical changes are again noted. Chronic blunting of the right costophrenic angle is noted. Mild central vascular congestion is noted similar to that seen on the prior exam. No new focal infiltrate is seen. IMPRESSION: Mild changes of CHF, stable from the prior study. Electronically Signed   By: Inez Catalina M.D.   On: 10/30/2021 03:49   ECHOCARDIOGRAM COMPLETE  Result Date: 10/29/2021    ECHOCARDIOGRAM REPORT   Patient Name:   Tony Williams Date of Exam: 10/29/2021 Medical Rec #:  [id number]        Height:       70.0 in Accession #:    [id number]       Weight:       276.3 lb Date of Birth:  1945/03/31        BSA:          2.395 m Patient Age:    40 years         BP:           132/59 mmHg Patient Gender: M                HR:           55 bpm. Exam Location:  [facility] Procedure: 2D Echo, Color Doppler, Cardiac  Doppler and Intracardiac            Opacification Agent Indications:     I50.31 congestive heart failure-Acute Diastolic  History:         Patient has prior history of Echocardiogram examinations, most                  recent 11/16/2020. CAD, Prior CABG, CKD and COPD; Risk                  Factors:Diabetes.  Sonographer:     Charmayne Sheer Referring Phys:  [place] Diagnosing Phys: Ida Rogue MD  Sonographer Comments: Technically challenging study due to limited acoustic windows. Image acquisition challenging due to patient body habitus. IMPRESSIONS  1. Left ventricular ejection fraction, by estimation, is 50 to 55%. The left ventricle has low normal function. Select images concerning for inferior wall hypokinesis. Left ventricular diastolic parameters are consistent with Grade I diastolic dysfunction (impaired relaxation).  2. Right ventricular systolic function is normal. The right ventricular size is normal.  3. Left atrial size was mildly dilated.  4. The mitral valve was not well visualized. Mild mitral valve regurgitation. No evidence of mitral stenosis.  5. The aortic valve was not well visualized. Aortic valve regurgitation is mild. No aortic stenosis is present.  6. The inferior vena cava is normal in size with greater than 50% respiratory variability, suggesting right atrial pressure of 3 mmHg. FINDINGS  Left Ventricle: Left ventricular ejection fraction, by estimation, is 50 to 55%. The left ventricle has low normal function. The left ventricle has no regional wall motion abnormalities. Definity contrast agent was given IV  to delineate the left ventricular endocardial borders. The left ventricular internal cavity size was normal in size. There is no left ventricular hypertrophy. Left ventricular diastolic parameters are consistent with Grade I diastolic dysfunction (impaired relaxation). Right Ventricle: The right ventricular size is normal. No increase in right ventricular wall thickness.  Right ventricular systolic function is normal. Left Atrium: Left atrial size was mildly dilated. Right Atrium: Right atrial size was normal in size. Pericardium: There is no evidence of pericardial effusion. Mitral Valve: The mitral valve was not well visualized. Mild mitral valve regurgitation. No evidence of mitral valve stenosis. Tricuspid Valve: The tricuspid valve is not well visualized. Tricuspid valve regurgitation is not demonstrated. No evidence of tricuspid stenosis. Aortic Valve: The aortic valve was not well visualized. Aortic valve regurgitation is mild. No aortic stenosis is present. Aortic valve mean gradient measures 4.0 mmHg. Aortic valve peak gradient measures 6.2 mmHg. Aortic valve area, by VTI measures 2.60  cm. Pulmonic Valve: The pulmonic valve was not well visualized. Pulmonic valve regurgitation is not visualized. No evidence of pulmonic stenosis. Aorta: The aortic root is normal in size and structure. Venous: The inferior vena cava is normal in size with greater than 50% respiratory variability, suggesting right atrial pressure of 3 mmHg. IAS/Shunts: No atrial level shunt detected by color flow Doppler.  LEFT VENTRICLE PLAX 2D LVIDd:         5.85 cm   Diastology LVIDs:         4.53 cm   LV e' medial:    5.33 cm/s LV PW:         1.20 cm   LV E/e' medial:  17.6 LV IVS:        0.81 cm   LV e' lateral:   9.25 cm/s LVOT diam:     2.00 cm   LV E/e' lateral: 10.1 LV SV:         58 LV SV Index:   24 LVOT Area:     3.14 cm  LEFT ATRIUM           Index LA diam:      4.40 cm 1.84 cm/m LA Vol (A4C): 45.4 ml 18.96 ml/m  AORTIC VALVE AV Area (Vmax):    2.42 cm AV Area (Vmean):   2.14 cm AV Area (VTI):     2.60 cm AV Vmax:           124.00 cm/s AV Vmean:          92.500 cm/s AV VTI:            0.222 m AV Peak Grad:      6.2 mmHg AV Mean Grad:      4.0 mmHg LVOT Vmax:         95.40 cm/s LVOT Vmean:        62.900 cm/s LVOT VTI:          0.184 m LVOT/AV VTI ratio: 0.83  AORTA Ao Root diam: 3.20 cm  MITRAL VALVE MV Area (PHT): 2.94 cm    SHUNTS MV Decel Time: 258 msec    Systemic VTI:  0.18 m MV E velocity: 93.60 cm/s  Systemic Diam: 2.00 cm MV A velocity: 60.60 cm/s MV E/A ratio:  1.54 Ida Rogue MD Electronically signed by Ida Rogue MD Signature Date/Time: 10/29/2021/2:24:14 PM    Final    DG Chest Port 1 View  Result Date: 10/28/2021 CLINICAL DATA:  Chest pain and shoulder pain, initial encounter EXAM: PORTABLE CHEST 1  VIEW COMPARISON:  01/28/2021 FINDINGS: Cardiac shadow is stable. Postsurgical changes are again seen. Vascular congestion is noted with very mild interstitial edema. The overall appearance is slightly worse than that seen on the prior study. Chronic blunting of the right costophrenic angle is noted. No bony abnormality is seen. IMPRESSION: Changes of mild CHF. Electronically Signed   By: Inez Catalina M.D.   On: 10/28/2021 20:02    Microbiology: No results found for this or any previous visit (from the past 240 hour(s)).   Labs: Basic Metabolic Panel: Recent Labs  Lab 10/28/21 1943 10/30/21 0555 10/31/21 0543  NA 139 137 137  K 4.9 4.8 4.0  CL 104 102 104  CO2 28 27 25   GLUCOSE 131* 105* 128*  BUN 38* 45* 48*  CREATININE 2.79* 3.00* 2.69*  CALCIUM 9.2 9.0 8.9   Liver Function Tests: No results for input(s): "AST", "ALT", "ALKPHOS", "BILITOT", "PROT", "ALBUMIN" in the last 168 hours. No results for input(s): "LIPASE", "AMYLASE" in the last 168 hours. No results for input(s): "AMMONIA" in the last 168 hours. CBC: Recent Labs  Lab 10/28/21 1943  WBC 7.5  HGB 15.3  HCT 48.7  MCV 93.5  PLT 254   Cardiac Enzymes: No results for input(s): "CKTOTAL", "CKMB", "CKMBINDEX", "TROPONINI" in the last 168 hours. BNP: BNP (last 3 results) Recent Labs    12/08/20 1525 01/26/21 1442 10/28/21 1943  BNP 142.7* 372.9* 238.3*    ProBNP (last 3 results) Recent Labs    09/17/21 1547  PROBNP 715*    CBG: Recent Labs  Lab 10/30/21 2059 10/30/21 2328  10/31/21 0433 10/31/21 0825 10/31/21 1218  GLUCAP 127* 245* 121* 152* 148*       Signed:  Desma Maxim MD.  [facility] 10/31/2021, 12:54 PM

## 2021-10-31 NOTE — Progress Notes (Signed)
Progress Note  Patient Name: Tony Williams Date of Encounter: 10/31/2021  [facility] Cardiologist: Kate Sable, MD   Subjective   Patient seen on AM rounds. Denies any chest pain or shortness of breath.No over night events have been recorded. Patient stated that he slept better last evening.  Inpatient Medications    Scheduled Meds:  amLODipine  5 mg Oral Daily   aspirin EC  81 mg Oral Daily   enoxaparin (LOVENOX) injection  30 mg Subcutaneous Q24H   escitalopram  20 mg Oral Daily   ezetimibe  10 mg Oral Daily   fenofibrate  160 mg Oral Daily   gabapentin  300 mg Oral BID   insulin aspart  0-20 Units Subcutaneous Q4H   prasugrel  10 mg Oral Daily   pravastatin  40 mg Oral q1800   traZODone  150 mg Oral QHS   Continuous Infusions:  PRN Meds: acetaminophen, albuterol, calcium carbonate, HYDROcodone-acetaminophen, ondansetron (ZOFRAN) IV   Vital Signs    Vitals:   10/30/21 2056 10/30/21 2329 10/31/21 0400 10/31/21 0837  BP: (!) 161/66 (!) 101/46 (!) 136/48 (!) 137/52  Pulse: 75 82 65 (!) 55  Resp:  '18 18 20  '$ Temp:  98.8 F (37.1 C) 97.7 F (36.5 C) 98.6 F (37 C)  TempSrc:  Oral Oral   SpO2:  96% 98% 94%  Weight:        Intake/Output Summary (Last 24 hours) at 10/31/2021 0924 Last data filed at 10/31/2021 0800 Gross per 24 hour  Intake 481.21 ml  Output 525 ml  Net -43.79 ml      10/30/2021    5:00 AM 10/27/2021    1:51 PM 10/26/2021    1:50 PM  Last 3 Weights  Weight (lbs) 272 lb 6.4 oz 276 lb 4.8 oz 278 lb  Weight (kg) 123.56 kg 125.329 kg 126.1 kg      Telemetry    Not on telemetry, refused to wear - Personally Reviewed  ECG    No new tracings - Personally Reviewed  Physical Exam   GEN: No acute distress.   Neck: Unable to assess JVD d/t girth Cardiac: RRR, no murmurs, rubs, or gallops.  Respiratory: Clear to auscultation bilaterally. Respirations are unlabored on room air.  GI: Soft, nontender, non-distended  MS: No edema; No  deformity. Neuro:  Nonfocal  Psych: Normal affect   Labs    High Sensitivity Troponin:   Recent Labs  Lab 10/28/21 1943 10/28/21 2124 10/29/21 0055 10/29/21 0509  TROPONINIHS 60* 62* 71* 63*     Chemistry Recent Labs  Lab 10/28/21 1943 10/30/21 0555 10/31/21 0543  NA 139 137 137  K 4.9 4.8 4.0  CL 104 102 104  CO2 '28 27 25  '$ GLUCOSE 131* 105* 128*  BUN 38* 45* 48*  CREATININE 2.79* 3.00* 2.69*  CALCIUM 9.2 9.0 8.9  GFRNONAA 23* 21* 24*  ANIONGAP '7 8 8    '$ Lipids No results for input(s): "CHOL", "TRIG", "HDL", "LABVLDL", "LDLCALC", "CHOLHDL" in the last 168 hours.  Hematology Recent Labs  Lab 10/28/21 1943  WBC 7.5  RBC 5.21  HGB 15.3  HCT 48.7  MCV 93.5  MCH 29.4  MCHC 31.4  RDW 15.2  PLT 254   Thyroid No results for input(s): "TSH", "FREET4" in the last 168 hours.  BNP Recent Labs  Lab 10/28/21 1943  BNP 238.3*    DDimer No results for input(s): "DDIMER" in the last 168 hours.   Radiology  Cardiac Studies  Myoview stress 10/30/2021 Pharmacological myocardial perfusion imaging study with no significant  ischemia Moderate-sized region fixed defect in the basal to mid inferior wall, mid anteroseptal region Basal to mid inferior wall hypokinesis, EF estimated at 39% Resting EKG with diffuse nonspecific ST and T wave abnormality, more pronounced with pharmacologic infusion with symptoms of chest discomfort No EKG changes concerning for ischemia at peak stress or in recovery. Moderate risk scan in the setting of fixed perfusion defect  Echocardiogram 10/29/2021 1. Left ventricular ejection fraction, by estimation, is 50 to 55%. The  left ventricle has low normal function. Select images concerning for  inferior wall hypokinesis. Left ventricular diastolic parameters are  consistent with Grade I diastolic  dysfunction (impaired relaxation).   2. Right ventricular systolic function is normal. The right ventricular  size is normal.   3. Left atrial  size was mildly dilated.   4. The mitral valve was not well visualized. Mild mitral valve  regurgitation. No evidence of mitral stenosis.   5. The aortic valve was not well visualized. Aortic valve regurgitation  is mild. No aortic stenosis is present.   6. The inferior vena cava is normal in size with greater than 50%  respiratory variability, suggesting right atrial pressure of 3 mmHg.   Patient Profile     76 y.o. male with a history of CAD s/p 4 vessels bypass in 2007 with subsequent interventions, hypertension, hyperlipidemia (statin intolerant), obesity, diabetes, remote tobacco abuse, COPD, sleep apnea, and stage III chronic kidney disease, who was admitted on 10/28/2021 with hypertensive urgency, chest pain, and demand ischemia.   Assessment & Plan    Demand ischemic/ CAD - remains chest pain free - Myoview revealed no ischemic perfusion defects - Hs troponins trended 60-62-71-63, which all remained flat - continue asa, effient, and satin therapy  Hypertensive urgency - blood pressure 137/52 - continue amlodipine - continue to hold ACEi and diuretic 2/2 renal function - vitals per un it protocol   Hyperlipidemia - LDL 90 10/2021 - continue fenofibrate and lovastatin and zetia  Chronic HFpEF - Echo reveals LVEF 50-55%, G1DD - diuretic on hold 2/2 elevated serum creatinine - continue to manage blood pressure with amlodipine - daily weight/ I&O/ low sodium diet  Acute on chronic stage III kidney disease - serum creatinine improved slightly this morning to 2.69 from 3.0 - Acei and dieretic remain on hold - SGLT2i remains on hold - follows with nephrology as outpatient - daily bmp - monitor urine output - monitor/trend/replace electrolytes as needed - avoid nephrotoxic agents when able     For questions or updates, please contact [facility] Please consult [email or web address] for contact info under        Signed, Eivin Mascio, NP  10/31/2021, 9:24 AM

## 2021-10-31 NOTE — TOC Transition Note (Signed)
Transition of Care [facility]) - CM/SW Discharge Note   Patient Details  Name: Tony Williams MRN: [id number] Date of Birth: 1946-01-22  Transition of Care [facility]) CM/SW Contact:  Harriet Masson, RN Phone Number:[phone] 10/31/2021, 1:06 PM   Clinical Narrative:    Spoke directly to pt today concerning CHF education and discussed when to contact his provider with related symptoms related to fluid retention of  2-3 lbs overnight or 5 lbs within one week with with any precipitating symptoms. Pt has opt to decline printed information concerning today's discussion in his discharge packet indicating hie has a full understanding of the education provided via HF. Pt reports he has sufficient transportation, utilizes [employer] and able to afford all his medications with a good support system within their single family home. Pt states he has a cane and RW if needed. No other request or inquires at this time.  TOC remains available is any additional information is needed.   Final next level of care: Home/Self Care Barriers to Discharge: No Barriers Identified   Patient Goals and CMS Choice        Discharge Placement                    Patient and family notified of of transfer: 10/31/21 (Spoke with pt directly)  Discharge Plan and Services                                     Social Determinants of Health (SDOH) Interventions     Readmission Risk Interventions    12/11/2020   11:06 AM  Readmission Risk Prevention Plan  Transportation Screening Complete  PCP or Specialist Appt within 5-7 Days Complete  Medication Review (RN CM) Complete

## 2021-11-02 ENCOUNTER — Other Ambulatory Visit: Payer: Self-pay | Admitting: Family Medicine

## 2021-11-02 ENCOUNTER — Other Ambulatory Visit: Payer: Self-pay | Admitting: *Deleted

## 2021-11-02 NOTE — Telephone Encounter (Signed)
Medication Refill - Medication: HYDROcodone-acetaminophen (NORCO/VICODIN) 5-325 MG tablet  Has the patient contacted their pharmacy? Yes.   (Agent: If no, request that the patient contact the pharmacy for the refill. If patient does not wish to contact the pharmacy document the reason why and proceed with request.) (Agent: If yes, when and what did the pharmacy advise?) call dr  Preferred Pharmacy (with phone number or street name):  [id number] - [place], [place]  [address] [place] [address]  Phone: [phone] Fax: [phone]  Hours: Not open 24 hours   Has the patient been seen for an appointment in the last year OR does the patient have an upcoming appointment? Yes.   Has HFU 8/28  Agent: Please be advised that RX refills may take up to 3 business days. We ask that you follow-up with your pharmacy.

## 2021-11-02 NOTE — Telephone Encounter (Signed)
Requested medication (s) are due for refill today: yes  Requested medication (s) are on the active medication list: yes  Last refill:  09/29/21 #30  Future visit scheduled: yes  Notes to clinic:  Please review for refill. Refill not delegated per protocol.     Requested Prescriptions  Pending Prescriptions Disp Refills   HYDROcodone-acetaminophen (NORCO/VICODIN) 5-325 MG tablet 30 tablet 0    Sig: Take 1 tablet by mouth every 6 (six) hours as needed for moderate pain.     Not Delegated - Analgesics:  Opioid Agonist Combinations Failed - 11/02/2021 11:04 AM      Failed - This refill cannot be delegated      Failed - Urine Drug Screen completed in last 360 days      Passed - Valid encounter within last 3 months    Recent Outpatient Visits           6 days ago Controlled type 2 diabetes mellitus with complication, without long-term current use of insulin ([place])   [facility] [place], [place], PA-C   1 month ago Controlled type 2 diabetes mellitus with complication, without long-term current use of insulin ([place])   [facility] [place], [place], PA-C   1 month ago Chronic pain of both knees   [employer], [place], PA-C   2 months ago Sales executive for annual physical exam   [employer], Dionne Bucy, MD   6 months ago GAD (generalized anxiety disorder)   [place], Dionne Bucy, MD       Future Appointments             In 1 week Ostwalt, Letitia Libra, PA-C [employer], [place]   In 3 weeks [name], PA-C [employer], [place]   In 3 months Bacigalupo, Dionne Bucy, MD [employer], [place]   In 5 months Agbor-Etang, Aaron Edelman, MD [facility], [place]

## 2021-11-02 NOTE — Patient Outreach (Signed)
  Care Coordination [facility] Note Transition Care Management Unsuccessful Follow-up Telephone Call  Date of discharge and from where:  10/31/21 [facility]  Attempts:  1st Attempt  Reason for unsuccessful TCM follow-up call:  Left voice message.  Emelia Loron RN, BSN [place] [phone] [email or web address]

## 2021-11-03 MED ORDER — HYDROCODONE-ACETAMINOPHEN 5-325 MG PO TABS
1.0000 | ORAL_TABLET | Freq: Four times a day (QID) | ORAL | 0 refills | Status: DC | PRN
Start: 2021-11-03 — End: 2021-12-03

## 2021-11-04 ENCOUNTER — Telehealth: Payer: Self-pay | Admitting: *Deleted

## 2021-11-04 NOTE — Patient Outreach (Signed)
  Care Coordination [facility] Note Transition Care Management Follow-up Telephone Call Date of discharge and from where: 10/31/21 [facility] How have you been since you were released from the hospital? Patient states he is feeling great.  Any questions or concerns? No  Items Reviewed: Did the pt receive and understand the discharge instructions provided? Yes  Medications obtained and verified? Yes  Other? No  Any new allergies since your discharge? No  Dietary orders reviewed? Yes Do you have support at home? Yes   Home Care and Equipment/Supplies: Were home health services ordered? no If so, what is the name of the agency? N/A   Has the agency set up a time to come to the patient's home? not applicable Were any new equipment or medical supplies ordered?  No What is the name of the medical supply agency? N/A Were you able to get the supplies/equipment? not applicable Do you have any questions related to the use of the equipment or supplies? No  Functional Questionnaire: (I = Independent and D = Dependent) ADLs: I  Bathing/Dressing- I  Meal Prep- I  Eating- I  Maintaining continence- I  Transferring/Ambulation- I  Managing Meds- I  Follow up appointments reviewed:  PCP Hospital f/u appt confirmed? Yes  Scheduled to see Ostwalt, [place] on 11/09/21 @ 1500. [facility] f/u appt confirmed? Yes  Scheduled to see NP [facility] cardiology on 11/20/21 @ 1330. Are transportation arrangements needed? No  If their condition worsens, is the pt aware to call PCP or go to the Emergency Dept.? Yes Was the patient provided with contact information for the PCP's office or ED? Yes Was to pt encouraged to call back with questions or concerns? Yes  SDOH assessments and interventions completed:   Yes  Care Coordination Interventions Activated:  No   Care Coordination Interventions:   N/A     Encounter Outcome:  Pt. Visit Completed    Emelia Loron RN, BSN [place] [phone] [email or web address]

## 2021-11-05 NOTE — Progress Notes (Signed)
Established patient visit   Patient: Tony Williams   DOB: [birth year]   76 y.o. Male  MRN: [id number] Visit Date: 11/09/2021  Today's healthcare provider: [name], PA-C   No chief complaint on file.  Subjective    HPI  Follow up Hospitalization  Patient was admitted to [facility] on 10/28/21 and discharged on 10/31/21. He was treated for: Principal Problem:   Unstable angina ([place]) Active Problems:   Hypertensive emergency   Acute on chronic diastolic CHF (congestive heart failure) (HCC)   Acute renal failure superimposed on stage 3a chronic kidney disease (HCC)   Chronic obstructive pulmonary disease (HCC)   Controlled type 2 diabetes mellitus with diabetic polyneuropathy, without long-term current use of insulin (HCC)   S/P primary angioplasty with coronary stent   Stage 3a chronic kidney disease (HCC)   OSA (obstructive sleep apnea)   Acute respiratory failure with hypoxia (HCC)   Obesity (BMI 30-39.9)   Hx of CABG  At the time of discharge he was instructed to: F/U with PCP 1 week, check of kidney function then F/U with Cardiology and nephrology   Follow low sodium heart healthy diet Increase activity slowly  Medication changes include: STOP Jardiance and Lisinopril NO changes in  Albuterol, Aspirin, Breztri, Co Q-10, Desoximentasone cream, Lexapro, Fenofibrate, Fish Oil, Gabapentin, Glimepiride, Hodrocodone, Lovastatin, NTG, Effient, Torsemide, Trazadone and Vitamin D3. START Amlodipine 5 mg daily on 11/01/21.  Patient states that the hospitolist told him that if his blood pressure was high after an hour after taking his Amlodipine 5 mg to take another pill.  He has had to take 2 a day for the last several days.   CHANGE Allopurinol from 300 mg to 100 mg daily   ----------------------------------------------------------------------------------------- -   Medications: Outpatient Medications Prior to Visit  Medication Sig  . albuterol (VENTOLIN HFA) 108 (90  Base) MCG/ACT inhaler Inhale 2 puffs into the lungs every 6 (six) hours as needed for wheezing or shortness of breath.  . allopurinol (ZYLOPRIM) 100 MG tablet Take 1 tablet (100 mg total) by mouth daily.  Marland Kitchen amLODipine (NORVASC) 5 MG tablet Take 1 tablet (5 mg total) by mouth daily.  Marland Kitchen aspirin EC 81 MG tablet Take 81 mg by mouth daily. Swallow whole.  . blood glucose meter kit and supplies Dispense based on patient and insurance preference. Use up to four times daily as directed. (FOR ICD-10 E10.9, E11.9). check blood glucose fasting and before meals four times daily.  . Budeson-Glycopyrrol-Formoterol (BREZTRI AEROSPHERE) 160-9-4.8 MCG/ACT AERO Inhale 2 puffs into the lungs 2 (two) times daily.  . Cholecalciferol (VITAMIN D3 PO) Take 4,000 Units by mouth.  . Coenzyme Q10 (CO Q-10) 400 MG CAPS Take by mouth daily at 6 (six) AM.  . desoximetasone (TOPICORT) 0.25 % cream Apply 1 application. topically 2 (two) times daily.  Marland Kitchen escitalopram (LEXAPRO) 20 MG tablet TAKE 1 TABLET(20 MG) BY MOUTH DAILY  . fenofibrate (TRICOR) 145 MG tablet Take 1 tablet (145 mg total) by mouth daily.  Marland Kitchen gabapentin (NEURONTIN) 300 MG capsule Take 1 capsule (300 mg total) by mouth 2 (two) times daily AND 2 capsules (600 mg total) at bedtime.  Marland Kitchen glimepiride (AMARYL) 1 MG tablet TAKE 1 TABLET(1 MG) BY MOUTH DAILY WITH BREAKFAST  . HYDROcodone-acetaminophen (NORCO/VICODIN) 5-325 MG tablet Take 1 tablet by mouth every 6 (six) hours as needed for moderate pain.  Marland Kitchen lovastatin (MEVACOR) 40 MG tablet TAKE 1 TABLET(40 MG) BY MOUTH DAILY  . nitroGLYCERIN (NITROSTAT) 0.4  MG SL tablet Place 1 tablet (0.4 mg total) under the tongue every 5 (five) minutes as needed for chest pain.  . Omega-3 Fatty Acids (FISH OIL) 1200 MG CAPS Take by mouth daily at 6 (six) AM.  . prasugrel (EFFIENT) 10 MG TABS tablet Take 1 tablet (10 mg total) by mouth daily.  Marland Kitchen torsemide (DEMADEX) 20 MG tablet Take 1 tablet (20 mg total) by mouth daily as needed.  .  traZODone (DESYREL) 150 MG tablet Take 1 tablet (150 mg total) by mouth at bedtime.   No facility-administered medications prior to visit.    Review of Systems  Constitutional:  Positive for fatigue.  Eyes:  Positive for visual disturbance.  Respiratory:  Positive for shortness of breath (chronic and unchanged). Negative for cough and wheezing.   Cardiovascular:  Negative for chest pain, palpitations and leg swelling.  Neurological:  Positive for headaches. Negative for dizziness and light-headedness.    {Labs  Heme  Chem  Endocrine  Serology  Results Review (optional):23779}   Objective    BP (!) 172/74 (BP Location: Left Arm, Patient Position: Sitting, Cuff Size: Large)   Pulse 60   Temp 98.4 F (36.9 C) (Oral)   Wt 274 lb (124.3 kg)   SpO2 97%   BMI 39.31 kg/m  Vitals:   11/09/21 1514 11/09/21 1531  BP: (!) 180/76 (!) 172/74  Pulse: 60   Temp: 98.4 F (36.9 C)   TempSrc: Oral   SpO2: 97%   Weight: 274 lb (124.3 kg)      Physical Exam Vitals reviewed.  Constitutional:      General: He is not in acute distress.    Appearance: Normal appearance. He is not diaphoretic.  HENT:     Head: Normocephalic and atraumatic.  Eyes:     General: No scleral icterus.    Extraocular Movements: Extraocular movements intact.     Conjunctiva/sclera: Conjunctivae normal.     Pupils: Pupils are equal, round, and reactive to light.  Cardiovascular:     Rate and Rhythm: Normal rate and regular rhythm.     Pulses: Normal pulses.     Heart sounds: Normal heart sounds. No murmur heard. Pulmonary:     Effort: Pulmonary effort is normal. No respiratory distress.     Breath sounds: Normal breath sounds. No wheezing or rhonchi.  Musculoskeletal:     Cervical back: Neck supple.     Right lower leg: No edema.     Left lower leg: No edema.  Lymphadenopathy:     Cervical: No cervical adenopathy.  Skin:    General: Skin is warm and dry.     Findings: No rash.  Neurological:      General: No focal deficit present.     Mental Status: He is alert and oriented to person, place, and time. Mental status is at baseline.  Psychiatric:        Behavior: Behavior normal.        Thought Content: Thought content normal.        Judgment: Judgment normal.      No results found for any visits on 11/09/21.  Assessment & Plan     1. Controlled type 2 diabetes mellitus with complication, without long-term current use of insulin (HCC) Chronic and stable. Last fasting glu - glimepiride (AMARYL) 1 MG tablet; TAKE 1 TABLET(1 MG) BY MOUTH DAILY WITH BREAKFAST  Dispense: 90 tablet; Refill: 3  2. Hospital discharge follow-up  - Renal Function Panel  3. Acute pain  of left knee   4. Stage 3a chronic kidney disease (HCC)  - glimepiride (AMARYL) 1 MG tablet; TAKE 1 TABLET(1 MG) BY MOUTH DAILY WITH BREAKFAST  Dispense: 90 tablet; Refill: 3 - Renal Function Panel Pt encouraged to schedule a fu with nephrology  5. BP increased to 172/74 Fu as scheduled    The patient was advised to call back or seek an in-person evaluation if the symptoms worsen or if the condition fails to improve as anticipated.  I discussed the assessment and treatment plan with the patient. The patient was provided an opportunity to ask questions and all were answered. The patient agreed with the plan and demonstrated an understanding of the instructions.  The entirety of the information documented in the History of Present Illness, Review of Systems and Physical Exam were personally obtained by me. Portions of this information were initially documented by the CMA and reviewed by me for thoroughness and accuracy.  Portions of this note were created using dictation software and may contain typographical errors.    [name], PA-C  [facility] [phone] (phone) [phone] (fax)  [place]

## 2021-11-09 ENCOUNTER — Observation Stay (HOSPITAL_COMMUNITY)
Admission: EM | Admit: 2021-11-09 | Discharge: 2021-11-12 | Disposition: A | Discharge status: Home / Self Care | Payer: Medicare Other | Attending: Internal Medicine | Admitting: Internal Medicine

## 2021-11-09 ENCOUNTER — Ambulatory Visit (INDEPENDENT_AMBULATORY_CARE_PROVIDER_SITE_OTHER): Payer: Medicare Other | Admitting: Physician Assistant

## 2021-11-09 ENCOUNTER — Other Ambulatory Visit: Payer: Self-pay

## 2021-11-09 ENCOUNTER — Encounter: Payer: Self-pay | Admitting: Physician Assistant

## 2021-11-09 VITALS — BP 172/74 | HR 60 | Temp 98.4°F | Wt 274.0 lb

## 2021-11-09 DIAGNOSIS — Z955 Presence of coronary angioplasty implant and graft: Secondary | ICD-10-CM | POA: Insufficient documentation

## 2021-11-09 DIAGNOSIS — I25810 Atherosclerosis of coronary artery bypass graft(s) without angina pectoris: Secondary | ICD-10-CM | POA: Diagnosis present

## 2021-11-09 DIAGNOSIS — G4730 Sleep apnea, unspecified: Secondary | ICD-10-CM | POA: Insufficient documentation

## 2021-11-09 DIAGNOSIS — Z87891 Personal history of nicotine dependence: Secondary | ICD-10-CM | POA: Diagnosis not present

## 2021-11-09 DIAGNOSIS — M25562 Pain in left knee: Secondary | ICD-10-CM

## 2021-11-09 DIAGNOSIS — G4489 Other headache syndrome: Secondary | ICD-10-CM | POA: Diagnosis not present

## 2021-11-09 DIAGNOSIS — E1122 Type 2 diabetes mellitus with diabetic chronic kidney disease: Secondary | ICD-10-CM | POA: Insufficient documentation

## 2021-11-09 DIAGNOSIS — R778 Other specified abnormalities of plasma proteins: Secondary | ICD-10-CM | POA: Diagnosis not present

## 2021-11-09 DIAGNOSIS — Z79899 Other long term (current) drug therapy: Secondary | ICD-10-CM | POA: Insufficient documentation

## 2021-11-09 DIAGNOSIS — G4733 Obstructive sleep apnea (adult) (pediatric): Secondary | ICD-10-CM | POA: Diagnosis not present

## 2021-11-09 DIAGNOSIS — J449 Chronic obstructive pulmonary disease, unspecified: Secondary | ICD-10-CM | POA: Insufficient documentation

## 2021-11-09 DIAGNOSIS — E1142 Type 2 diabetes mellitus with diabetic polyneuropathy: Secondary | ICD-10-CM | POA: Diagnosis present

## 2021-11-09 DIAGNOSIS — E118 Type 2 diabetes mellitus with unspecified complications: Secondary | ICD-10-CM | POA: Diagnosis not present

## 2021-11-09 DIAGNOSIS — M1030 Gout due to renal impairment, unspecified site: Secondary | ICD-10-CM | POA: Insufficient documentation

## 2021-11-09 DIAGNOSIS — I160 Hypertensive urgency: Secondary | ICD-10-CM | POA: Diagnosis not present

## 2021-11-09 DIAGNOSIS — Z09 Encounter for follow-up examination after completed treatment for conditions other than malignant neoplasm: Secondary | ICD-10-CM

## 2021-11-09 DIAGNOSIS — Z7984 Long term (current) use of oral hypoglycemic drugs: Secondary | ICD-10-CM | POA: Diagnosis not present

## 2021-11-09 DIAGNOSIS — R0902 Hypoxemia: Secondary | ICD-10-CM | POA: Diagnosis not present

## 2021-11-09 DIAGNOSIS — N1831 Chronic kidney disease, stage 3a: Secondary | ICD-10-CM | POA: Insufficient documentation

## 2021-11-09 DIAGNOSIS — Z951 Presence of aortocoronary bypass graft: Secondary | ICD-10-CM | POA: Insufficient documentation

## 2021-11-09 DIAGNOSIS — I10 Essential (primary) hypertension: Secondary | ICD-10-CM | POA: Diagnosis not present

## 2021-11-09 DIAGNOSIS — R519 Headache, unspecified: Secondary | ICD-10-CM | POA: Diagnosis not present

## 2021-11-09 DIAGNOSIS — Z8551 Personal history of malignant neoplasm of bladder: Secondary | ICD-10-CM | POA: Insufficient documentation

## 2021-11-09 DIAGNOSIS — M1A30X0 Chronic gout due to renal impairment, unspecified site, without tophus (tophi): Secondary | ICD-10-CM | POA: Diagnosis present

## 2021-11-09 DIAGNOSIS — E1140 Type 2 diabetes mellitus with diabetic neuropathy, unspecified: Secondary | ICD-10-CM | POA: Diagnosis not present

## 2021-11-09 DIAGNOSIS — H538 Other visual disturbances: Secondary | ICD-10-CM | POA: Diagnosis not present

## 2021-11-09 DIAGNOSIS — I5032 Chronic diastolic (congestive) heart failure: Secondary | ICD-10-CM | POA: Diagnosis not present

## 2021-11-09 DIAGNOSIS — R030 Elevated blood-pressure reading, without diagnosis of hypertension: Secondary | ICD-10-CM | POA: Diagnosis present

## 2021-11-09 DIAGNOSIS — I130 Hypertensive heart and chronic kidney disease with heart failure and stage 1 through stage 4 chronic kidney disease, or unspecified chronic kidney disease: Secondary | ICD-10-CM | POA: Diagnosis not present

## 2021-11-09 DIAGNOSIS — I2510 Atherosclerotic heart disease of native coronary artery without angina pectoris: Secondary | ICD-10-CM | POA: Insufficient documentation

## 2021-11-09 DIAGNOSIS — Z7982 Long term (current) use of aspirin: Secondary | ICD-10-CM | POA: Diagnosis not present

## 2021-11-09 DIAGNOSIS — E119 Type 2 diabetes mellitus without complications: Secondary | ICD-10-CM | POA: Diagnosis present

## 2021-11-09 DIAGNOSIS — N289 Disorder of kidney and ureter, unspecified: Secondary | ICD-10-CM

## 2021-11-09 MED ORDER — GLIMEPIRIDE 1 MG PO TABS: ORAL_TABLET | ORAL | 3 refills | Status: DC

## 2021-11-09 MED ORDER — PRASUGREL HCL 10 MG PO TABS: 10.0000 mg | ORAL_TABLET | Freq: Every day | ORAL | 4 refills | Status: DC

## 2021-11-09 NOTE — ED Provider Notes (Addendum)
[facility] EMERGENCY DEPARTMENT Provider Note   CSN: [id number] Arrival date & time: 11/09/21  2321     History  Chief Complaint  Patient presents with   Hypertension    Tony Williams is a 76 y.o. male.  The history is provided by the patient.  Hypertension  He has history of hypertension, diabetes, hyperlipidemia, coronary artery disease, chronic kidney disease, heart failure with preserved ejection fraction, COPD and comes in because of elevated blood pressure and headache and blurred vision.  He had recently been hospitalized because of elevated blood pressure and was discharged with a prescription for amlodipine and instructed to check his blood pressure 1 hour following amlodipine dose and repeat if necessary.  Over the last 2-3 days, blood pressure has trended upward from 458 systolic to now as high as 099 and 833 systolic.  During that time, he has developed a global headache and some blurred vision.  He denies any weakness or numbness or tingling.  Today he took a total of 3 doses of the amlodipine but blood pressure continued to be high.  He denies chest pain, heaviness, tightness, pressure.  He claims compliance with all medications.   Home Medications Prior to Admission medications   Medication Sig Start Date End Date Taking? Authorizing Provider  albuterol (VENTOLIN HFA) 108 (90 Base) MCG/ACT inhaler Inhale 2 puffs into the lungs every 6 (six) hours as needed for wheezing or shortness of breath. 09/25/21   Virginia Crews, MD  allopurinol (ZYLOPRIM) 100 MG tablet Take 1 tablet (100 mg total) by mouth daily. 10/31/21 10/31/22  Wouk, Ailene Rud, MD  amLODipine (NORVASC) 5 MG tablet Take 1 tablet (5 mg total) by mouth daily. 11/01/21   Wouk, Ailene Rud, MD  aspirin EC 81 MG tablet Take 81 mg by mouth daily. Swallow whole.    [provider]  blood glucose meter kit and supplies Dispense based on patient and insurance preference. Use up to four  times daily as directed. (FOR ICD-10 E10.9, E11.9). check blood glucose fasting and before meals four times daily. 04/18/20   Flinchum, Kelby Aline, FNP  Budeson-Glycopyrrol-Formoterol (BREZTRI AEROSPHERE) 160-9-4.8 MCG/ACT AERO Inhale 2 puffs into the lungs 2 (two) times daily. 01/27/21   Parrett, Fonnie Mu, NP  Cholecalciferol (VITAMIN D3 PO) Take 4,000 Units by mouth.    [provider]  Coenzyme Q10 (CO Q-10) 400 MG CAPS Take by mouth daily at 6 (six) AM.    [provider]  desoximetasone (TOPICORT) 0.25 % cream Apply 1 application. topically 2 (two) times daily. 08/06/21   Virginia Crews, MD  escitalopram (LEXAPRO) 20 MG tablet TAKE 1 TABLET(20 MG) BY MOUTH DAILY 10/15/21   Bacigalupo, Dionne Bucy, MD  fenofibrate (TRICOR) 145 MG tablet Take 1 tablet (145 mg total) by mouth daily. 07/24/21   Kate Sable, MD  gabapentin (NEURONTIN) 300 MG capsule Take 1 capsule (300 mg total) by mouth 2 (two) times daily AND 2 capsules (600 mg total) at bedtime. 01/29/21   Bacigalupo, Dionne Bucy, MD  glimepiride (AMARYL) 1 MG tablet TAKE 1 TABLET(1 MG) BY MOUTH DAILY WITH BREAKFAST 11/09/21   Ostwalt, Letitia Libra, PA-C  HYDROcodone-acetaminophen (NORCO/VICODIN) 5-325 MG tablet Take 1 tablet by mouth every 6 (six) hours as needed for moderate pain. 11/03/21   Gwyneth Sprout, FNP  lovastatin (MEVACOR) 40 MG tablet TAKE 1 TABLET(40 MG) BY MOUTH DAILY 08/13/21   Bacigalupo, Dionne Bucy, MD  nitroGLYCERIN (NITROSTAT) 0.4 MG SL tablet Place 1 tablet (0.4  mg total) under the tongue every 5 (five) minutes as needed for chest pain. 10/26/21 01/24/22  Kate Sable, MD  Omega-3 Fatty Acids (FISH OIL) 1200 MG CAPS Take by mouth daily at 6 (six) AM.    [provider]  prasugrel (EFFIENT) 10 MG TABS tablet Take 1 tablet (10 mg total) by mouth daily. 11/09/21   Kate Sable, MD  torsemide (DEMADEX) 20 MG tablet Take 1 tablet (20 mg total) by mouth daily as needed. 09/28/21   Kate Sable, MD   traZODone (DESYREL) 150 MG tablet Take 1 tablet (150 mg total) by mouth at bedtime. 04/16/21   Virginia Crews, MD      Allergies    Butorphanol, Morphine, Pedi-pre tape spray [wound dressing adhesive], Simvastatin, and Statins    Review of Systems   Review of Systems  All other systems reviewed and are negative.   Physical Exam Updated Vital Signs BP (!) 202/70   Pulse 67   Temp 97.8 F (36.6 C) (Oral)   Resp 20   SpO2 93%  Physical Exam Vitals and nursing note reviewed.   76 year old male, resting comfortably and in no acute distress. Vital signs are significant for elevated blood pressure. Oxygen saturation is 93%, which is normal. Head is normocephalic and atraumatic. PERRLA, EOMI. Oropharynx is clear.  Fundi show no hemorrhage or exudate. Neck is nontender and supple without adenopathy or JVD. Back is nontender and there is no CVA tenderness. Lungs are clear without rales, wheezes, or rhonchi. Chest is nontender. Heart has regular rate and rhythm without murmur. Abdomen is soft, flat, nontender. Extremities have no cyanosis or edema, full range of motion is present. Skin is warm and dry without rash. Neurologic: Mental status is normal, cranial nerves are intact, strength is 5/5 in all 4 extremities.  ED Results / Procedures / Treatments   Labs (all labs ordered are listed, but only abnormal results are displayed) Labs Reviewed  BASIC METABOLIC PANEL - Abnormal; Notable for the following components:      Result Value   Glucose, Bld 116 (*)    BUN 33 (*)    Creatinine, Ser 1.98 (*)    GFR, Estimated 35 (*)    All other components within normal limits  TROPONIN I (HIGH SENSITIVITY) - Abnormal; Notable for the following components:   Troponin I (High Sensitivity) 37 (*)    All other components within normal limits  TROPONIN I (HIGH SENSITIVITY) - Abnormal; Notable for the following components:   Troponin I (High Sensitivity) 41 (*)    All other components  within normal limits  CBC  CBC  CREATININE, SERUM   Radiology CT Head Wo Contrast  Result Date: 11/10/2021 CLINICAL DATA:  Headache, hypertension, blurred vision. EXAM: CT HEAD WITHOUT CONTRAST TECHNIQUE: Contiguous axial images were obtained from the base of the skull through the vertex without intravenous contrast. RADIATION DOSE REDUCTION: This exam was performed according to the departmental dose-optimization program which includes automated exposure control, adjustment of the mA and/or kV according to patient size and/or use of iterative reconstruction technique. COMPARISON:  03/11/2021. FINDINGS: Brain: No acute intracranial hemorrhage, midline shift or mass effect. No extra-axial fluid collection. Subcortical and periventricular white matter hypodensities are present bilaterally. No hydrocephalus. Diffuse atrophy is noted. 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 Vascular: Atherosclerotic calcification of the carotid siphons and vertebral arteries. No hyperdense vessel. Skull: Normal. Negative for fracture or focal lesion. Sinuses/Orbits: No acute finding. Other: None.  IMPRESSION: 1. No acute intracranial process. 2. 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 Electronically Signed   By: Brett Fairy M.D.   On: 11/10/2021 03:49    Procedures Procedures    Medications Ordered in ED Medications - No data to display  ED Course/ Medical Decision Making/ A&P                           Medical Decision Making Amount and/or Complexity of Data Reviewed Labs: ordered. Radiology: ordered.  Risk Prescription drug management. Decision regarding hospitalization.   Elevated blood pressure with headache and blurred vision.  By definition, this is a hypertensive urgency.  I have ordered intravenous labetalol for his blood pressure.  I have ordered work-up including CBC, basic metabolic panel, troponin, CT of head.  Old  records are reviewed, and he had been admitted from 10/28/2021 through 10/31/2021 with unstable angina and hypertensive emergency.  Discharge blood pressure on 10/31/2021 was 137/52 but initial blood pressure was 195/60.  Blood pressure has come down with labetalol, and headache has improved.  I have reviewed and interpreted his laboratory tests and my interpretation is renal insufficiency which is actually improved over baseline, normal CBC.  Troponin is mildly elevated and felt to be secondary to demand ischemia.  Repeat troponin showed minimal increase, not consistent with ACS.  CT of head shows no acute process.  I have independently viewed the images, and agree with the radiologist's interpretation.  Case is discussed with Dr. Hal Hope of [facility] hospitalist, who agrees to admit the patient.  CRITICAL CARE Performed by: Delora Fuel Total critical care time: 65 minutes Critical care time was exclusive of separately billable procedures and treating other patients. Critical care was necessary to treat or prevent imminent or life-threatening deterioration. Critical care was time spent personally by me on the following activities: development of treatment plan with patient and/or surrogate as well as nursing, discussions with consultants, evaluation of patient's response to treatment, examination of patient, obtaining history from patient or surrogate, ordering and performing treatments and interventions, ordering and review of laboratory studies, ordering and review of radiographic studies, pulse oximetry and re-evaluation of patient's condition.  Final Clinical Impression(s) / ED Diagnoses Final diagnoses:  Hypertensive urgency  Elevated troponin  Renal insufficiency    Rx / DC Orders ED Discharge Orders     None         Delora Fuel, MD 57/32/20 2542    Delora Fuel, MD 70/62/37 [phone]

## 2021-11-09 NOTE — ED Provider Notes (Incomplete)
[facility] EMERGENCY DEPARTMENT Provider Note   CSN: [id number] Arrival date & time: 11/09/21  2321     History {Add pertinent medical, surgical, social history, OB history to HPI:1} Chief Complaint  Patient presents with  . Hypertension    Tony Williams is a 76 y.o. male.  The history is provided by the patient.  Hypertension       Home Medications Prior to Admission medications   Medication Sig Start Date End Date Taking? Authorizing Provider  albuterol (VENTOLIN HFA) 108 (90 Base) MCG/ACT inhaler Inhale 2 puffs into the lungs every 6 (six) hours as needed for wheezing or shortness of breath. 09/25/21   Virginia Crews, MD  allopurinol (ZYLOPRIM) 100 MG tablet Take 1 tablet (100 mg total) by mouth daily. 10/31/21 10/31/22  Wouk, Ailene Rud, MD  amLODipine (NORVASC) 5 MG tablet Take 1 tablet (5 mg total) by mouth daily. 11/01/21   Wouk, Ailene Rud, MD  aspirin EC 81 MG tablet Take 81 mg by mouth daily. Swallow whole.    [provider]  blood glucose meter kit and supplies Dispense based on patient and insurance preference. Use up to four times daily as directed. (FOR ICD-10 E10.9, E11.9). check blood glucose fasting and before meals four times daily. 04/18/20   Flinchum, Kelby Aline, FNP  Budeson-Glycopyrrol-Formoterol (BREZTRI AEROSPHERE) 160-9-4.8 MCG/ACT AERO Inhale 2 puffs into the lungs 2 (two) times daily. 01/27/21   Parrett, Fonnie Mu, NP  Cholecalciferol (VITAMIN D3 PO) Take 4,000 Units by mouth.    [provider]  Coenzyme Q10 (CO Q-10) 400 MG CAPS Take by mouth daily at 6 (six) AM.    [provider]  desoximetasone (TOPICORT) 0.25 % cream Apply 1 application. topically 2 (two) times daily. 08/06/21   Virginia Crews, MD  escitalopram (LEXAPRO) 20 MG tablet TAKE 1 TABLET(20 MG) BY MOUTH DAILY 10/15/21   Bacigalupo, Dionne Bucy, MD  fenofibrate (TRICOR) 145 MG tablet Take 1 tablet (145 mg total) by mouth daily. 07/24/21    Kate Sable, MD  gabapentin (NEURONTIN) 300 MG capsule Take 1 capsule (300 mg total) by mouth 2 (two) times daily AND 2 capsules (600 mg total) at bedtime. 01/29/21   Bacigalupo, Dionne Bucy, MD  glimepiride (AMARYL) 1 MG tablet TAKE 1 TABLET(1 MG) BY MOUTH DAILY WITH BREAKFAST 11/09/21   Ostwalt, Letitia Libra, PA-C  HYDROcodone-acetaminophen (NORCO/VICODIN) 5-325 MG tablet Take 1 tablet by mouth every 6 (six) hours as needed for moderate pain. 11/03/21   Gwyneth Sprout, FNP  lovastatin (MEVACOR) 40 MG tablet TAKE 1 TABLET(40 MG) BY MOUTH DAILY 08/13/21   Bacigalupo, Dionne Bucy, MD  nitroGLYCERIN (NITROSTAT) 0.4 MG SL tablet Place 1 tablet (0.4 mg total) under the tongue every 5 (five) minutes as needed for chest pain. 10/26/21 01/24/22  Kate Sable, MD  Omega-3 Fatty Acids (FISH OIL) 1200 MG CAPS Take by mouth daily at 6 (six) AM.    [provider]  prasugrel (EFFIENT) 10 MG TABS tablet Take 1 tablet (10 mg total) by mouth daily. 11/09/21   Kate Sable, MD  torsemide (DEMADEX) 20 MG tablet Take 1 tablet (20 mg total) by mouth daily as needed. 09/28/21   Kate Sable, MD  traZODone (DESYREL) 150 MG tablet Take 1 tablet (150 mg total) by mouth at bedtime. 04/16/21   Virginia Crews, MD      Allergies    Butorphanol, Morphine, Pedi-pre tape spray [wound dressing adhesive], Simvastatin, and Statins    Review of  Systems   Review of Systems  All other systems reviewed and are negative.   Physical Exam Updated Vital Signs BP (!) 202/70   Pulse 67   Temp 97.8 F (36.6 C) (Oral)   Resp 20   SpO2 93%  Physical Exam Vitals and nursing note reviewed.   76 year old 49 male, resting comfortably and in no acute distress. Vital signs are ***. Oxygen saturation is ***%, which is normal. Head is normocephalic and atraumatic. PERRLA, EOMI. Oropharynx is clear. Neck is nontender and supple without adenopathy or JVD. Back is nontender and there is no CVA tenderness. Lungs are  clear without rales, wheezes, or rhonchi. Chest is nontender. Heart has regular rate and rhythm without murmur. Abdomen is soft, flat, nontender without masses or hepatosplenomegaly and peristalsis is normoactive. Extremities have no cyanosis or edema, full range of motion is present. Skin is warm and dry without rash. Neurologic: Mental status is normal, cranial nerves are intact, there are no motor or sensory deficits.  ED Results / Procedures / Treatments   Labs (all labs ordered are listed, but only abnormal results are displayed) Labs Reviewed - No data to display  EKG None  Radiology No results found.  Procedures Procedures  {Document cardiac monitor, telemetry assessment procedure when appropriate:1}  Medications Ordered in ED Medications - No data to display  ED Course/ Medical Decision Making/ A&P                           Medical Decision Making  ***  {Document critical care time when appropriate:1} {Document review of labs and clinical decision tools ie heart score, Chads2Vasc2 etc:1}  {Document your independent review of radiology images, and any outside records:1} {Document your discussion with family members, caretakers, and with consultants:1} {Document social determinants of health affecting pt's care:1} {Document your decision making why or why not admission, treatments were needed:1} Final Clinical Impression(s) / ED Diagnoses Final diagnoses:  None    Rx / DC Orders ED Discharge Orders     None

## 2021-11-09 NOTE — ED Triage Notes (Signed)
BIB [facility] from home due to HTN, blurred vision since noon, not other complaints at this time

## 2021-11-10 ENCOUNTER — Emergency Department (HOSPITAL_COMMUNITY): Payer: Medicare Other

## 2021-11-10 ENCOUNTER — Encounter (HOSPITAL_COMMUNITY): Payer: Self-pay | Admitting: Emergency Medicine

## 2021-11-10 ENCOUNTER — Other Ambulatory Visit: Payer: Self-pay

## 2021-11-10 DIAGNOSIS — I160 Hypertensive urgency: Secondary | ICD-10-CM | POA: Diagnosis not present

## 2021-11-10 DIAGNOSIS — E1142 Type 2 diabetes mellitus with diabetic polyneuropathy: Secondary | ICD-10-CM | POA: Diagnosis not present

## 2021-11-10 DIAGNOSIS — R519 Headache, unspecified: Secondary | ICD-10-CM | POA: Diagnosis not present

## 2021-11-10 LAB — CBC
HCT: 43.6 % (ref 39.0–52.0)
HCT: 47.2 % (ref 39.0–52.0)
Hemoglobin: 13.9 g/dL (ref 13.0–17.0)
Hemoglobin: 14.8 g/dL (ref 13.0–17.0)
MCH: 29.3 pg (ref 26.0–34.0)
MCH: 29.8 pg (ref 26.0–34.0)
MCHC: 31.4 g/dL (ref 30.0–36.0)
MCHC: 31.9 g/dL (ref 30.0–36.0)
MCV: 93.5 fL (ref 80.0–100.0)
MCV: 93.6 fL (ref 80.0–100.0)
Platelets: 264 10*3/uL (ref 150–400)
Platelets: 278 10*3/uL (ref 150–400)
RBC: 4.66 MIL/uL (ref 4.22–5.81)
RBC: 5.05 MIL/uL (ref 4.22–5.81)
RDW: 14.6 % (ref 11.5–15.5)
RDW: 14.8 % (ref 11.5–15.5)
WBC: 6 10*3/uL (ref 4.0–10.5)
WBC: 7 10*3/uL (ref 4.0–10.5)
nRBC: 0 % (ref 0.0–0.2)
nRBC: 0 % (ref 0.0–0.2)

## 2021-11-10 LAB — CBG MONITORING, ED
Glucose-Capillary: 125 mg/dL — ABNORMAL HIGH (ref 70–99)
Glucose-Capillary: 153 mg/dL — ABNORMAL HIGH (ref 70–99)
Glucose-Capillary: 143 mg/dL — ABNORMAL HIGH (ref 70–99)

## 2021-11-10 LAB — BASIC METABOLIC PANEL
Anion gap: 10 (ref 5–15)
BUN: 33 mg/dL — ABNORMAL HIGH (ref 8–23)
CO2: 22 mmol/L (ref 22–32)
Calcium: 9.1 mg/dL (ref 8.9–10.3)
Chloride: 105 mmol/L (ref 98–111)
Creatinine, Ser: 1.98 mg/dL — ABNORMAL HIGH (ref 0.61–1.24)
GFR, Estimated: 35 mL/min — ABNORMAL LOW (ref >60)
Glucose, Bld: 116 mg/dL — ABNORMAL HIGH (ref 70–99)
Potassium: 3.9 mmol/L (ref 3.5–5.1)
Sodium: 137 mmol/L (ref 135–145)

## 2021-11-10 LAB — CREATININE, SERUM
Creatinine, Ser: 2.1 mg/dL — ABNORMAL HIGH (ref 0.61–1.24)
GFR, Estimated: 32 mL/min — ABNORMAL LOW (ref >60)

## 2021-11-10 LAB — GLUCOSE, CAPILLARY: Glucose-Capillary: 128 mg/dL — ABNORMAL HIGH (ref 70–99)

## 2021-11-10 LAB — TROPONIN I (HIGH SENSITIVITY)
Troponin I (High Sensitivity): 37 ng/L — ABNORMAL HIGH (ref <18)
Troponin I (High Sensitivity): 41 ng/L — ABNORMAL HIGH (ref <18)

## 2021-11-10 MED ORDER — HYDROMORPHONE HCL 1 MG/ML IJ SOLN
1.0000 mg | INTRAMUSCULAR | Status: DC | PRN
  Administered 2021-11-10 – 2021-11-11 (×4): 1 mg via INTRAVENOUS
  Filled 2021-11-10 (×4): qty 1

## 2021-11-10 MED ORDER — PRAVASTATIN SODIUM 40 MG PO TABS
40.0000 mg | ORAL_TABLET | Freq: Every day | ORAL | Status: DC
  Administered 2021-11-11: 40 mg via ORAL
  Filled 2021-11-10: qty 1

## 2021-11-10 MED ORDER — ALBUTEROL SULFATE (2.5 MG/3ML) 0.083% IN NEBU: 3.0000 mL | INHALATION_SOLUTION | Freq: Four times a day (QID) | RESPIRATORY_TRACT | Status: DC | PRN

## 2021-11-10 MED ORDER — HYDROCODONE-ACETAMINOPHEN 5-325 MG PO TABS
1.0000 | ORAL_TABLET | Freq: Once | ORAL | Status: AC
  Administered 2021-11-10: 1 via ORAL
  Filled 2021-11-10: qty 1

## 2021-11-10 MED ORDER — ENOXAPARIN SODIUM 40 MG/0.4ML IJ SOSY
40.0000 mg | PREFILLED_SYRINGE | INTRAMUSCULAR | Status: DC
  Administered 2021-11-10 – 2021-11-12 (×3): 40 mg via SUBCUTANEOUS
  Filled 2021-11-10 (×3): qty 0.4

## 2021-11-10 MED ORDER — LABETALOL HCL 5 MG/ML IV SOLN
10.0000 mg | INTRAVENOUS | Status: DC | PRN
Start: 2021-11-10 — End: 2021-11-12

## 2021-11-10 MED ORDER — PRASUGREL HCL 10 MG PO TABS
10.0000 mg | ORAL_TABLET | Freq: Every day | ORAL | Status: DC
  Administered 2021-11-10 – 2021-11-11 (×2): 10 mg via ORAL
  Filled 2021-11-10 (×2): qty 1

## 2021-11-10 MED ORDER — HYDRALAZINE HCL 20 MG/ML IJ SOLN: 10.0000 mg | INTRAMUSCULAR | Status: DC | PRN

## 2021-11-10 MED ORDER — ACETAMINOPHEN 325 MG PO TABS: 650.0000 mg | ORAL_TABLET | Freq: Four times a day (QID) | ORAL | Status: DC | PRN

## 2021-11-10 MED ORDER — INSULIN ASPART 100 UNIT/ML IJ SOLN
0.0000 [IU] | Freq: Three times a day (TID) | INTRAMUSCULAR | Status: DC
  Administered 2021-11-10: 2 [IU] via SUBCUTANEOUS
  Administered 2021-11-10 – 2021-11-12 (×6): 1 [IU] via SUBCUTANEOUS

## 2021-11-10 MED ORDER — TRAZODONE HCL 50 MG PO TABS
150.0000 mg | ORAL_TABLET | Freq: Every evening | ORAL | Status: DC | PRN
  Administered 2021-11-10 – 2021-11-11 (×2): 150 mg via ORAL
  Filled 2021-11-10 (×2): qty 3

## 2021-11-10 MED ORDER — AMLODIPINE BESYLATE 10 MG PO TABS
10.0000 mg | ORAL_TABLET | Freq: Every day | ORAL | Status: DC
  Administered 2021-11-10 – 2021-11-12 (×3): 10 mg via ORAL
  Filled 2021-11-10: qty 1
  Filled 2021-11-10: qty 2
  Filled 2021-11-10: qty 1

## 2021-11-10 MED ORDER — BUDESON-GLYCOPYRROL-FORMOTEROL 160-9-4.8 MCG/ACT IN AERO: 2.0000 | INHALATION_SPRAY | Freq: Two times a day (BID) | RESPIRATORY_TRACT | Status: DC

## 2021-11-10 MED ORDER — FLUTICASONE FUROATE-VILANTEROL 200-25 MCG/ACT IN AEPB
1.0000 | INHALATION_SPRAY | Freq: Every day | RESPIRATORY_TRACT | Status: DC
  Administered 2021-11-10 – 2021-11-12 (×3): 1 via RESPIRATORY_TRACT
  Filled 2021-11-10: qty 28

## 2021-11-10 MED ORDER — POLYETHYLENE GLYCOL 3350 17 G PO PACK
17.0000 g | PACK | Freq: Every day | ORAL | Status: DC
  Administered 2021-11-10 – 2021-11-12 (×3): 17 g via ORAL
  Filled 2021-11-10 (×3): qty 1

## 2021-11-10 MED ORDER — ASPIRIN 81 MG PO TBEC
81.0000 mg | DELAYED_RELEASE_TABLET | Freq: Every day | ORAL | Status: DC
  Administered 2021-11-10 – 2021-11-12 (×3): 81 mg via ORAL
  Filled 2021-11-10 (×3): qty 1

## 2021-11-10 MED ORDER — NITROGLYCERIN 0.4 MG SL SUBL
0.4000 mg | SUBLINGUAL_TABLET | SUBLINGUAL | Status: DC | PRN
Start: 2021-11-10 — End: 2021-11-12

## 2021-11-10 MED ORDER — ACETAMINOPHEN 650 MG RE SUPP: 650.0000 mg | Freq: Four times a day (QID) | RECTAL | Status: DC | PRN

## 2021-11-10 MED ORDER — HYDROCODONE-ACETAMINOPHEN 5-325 MG PO TABS
1.0000 | ORAL_TABLET | ORAL | Status: DC | PRN
  Administered 2021-11-10 – 2021-11-11 (×2): 2 via ORAL
  Filled 2021-11-10 (×3): qty 2

## 2021-11-10 MED ORDER — ALLOPURINOL 100 MG PO TABS
100.0000 mg | ORAL_TABLET | Freq: Every day | ORAL | Status: DC
  Administered 2021-11-10 – 2021-11-12 (×3): 100 mg via ORAL
  Filled 2021-11-10 (×4): qty 1

## 2021-11-10 MED ORDER — HYDROCODONE-ACETAMINOPHEN 5-325 MG PO TABS: 1.0000 | ORAL_TABLET | Freq: Four times a day (QID) | ORAL | Status: DC | PRN

## 2021-11-10 MED ORDER — AMLODIPINE BESYLATE 5 MG PO TABS
5.0000 mg | ORAL_TABLET | Freq: Every day | ORAL | Status: DC
Start: 2021-11-10 — End: 2021-11-10

## 2021-11-10 MED ORDER — MORPHINE SULFATE (PF) 2 MG/ML IV SOLN
2.0000 mg | INTRAVENOUS | Status: DC | PRN
  Filled 2021-11-10: qty 1

## 2021-11-10 MED ORDER — HYDRALAZINE HCL 25 MG PO TABS
25.0000 mg | ORAL_TABLET | Freq: Three times a day (TID) | ORAL | Status: DC
  Administered 2021-11-10 – 2021-11-11 (×5): 25 mg via ORAL
  Filled 2021-11-10 (×5): qty 1

## 2021-11-10 MED ORDER — DOCUSATE SODIUM 100 MG PO CAPS
100.0000 mg | ORAL_CAPSULE | Freq: Two times a day (BID) | ORAL | Status: DC
  Administered 2021-11-10 – 2021-11-12 (×5): 100 mg via ORAL
  Filled 2021-11-10 (×5): qty 1

## 2021-11-10 MED ORDER — LABETALOL HCL 5 MG/ML IV SOLN
20.0000 mg | INTRAVENOUS | Status: DC | PRN
  Administered 2021-11-10: 20 mg via INTRAVENOUS
  Filled 2021-11-10: qty 4

## 2021-11-10 MED ORDER — UMECLIDINIUM BROMIDE 62.5 MCG/ACT IN AEPB
1.0000 | INHALATION_SPRAY | Freq: Every day | RESPIRATORY_TRACT | Status: DC
  Administered 2021-11-10 – 2021-11-12 (×3): 1 via RESPIRATORY_TRACT
  Filled 2021-11-10: qty 7

## 2021-11-10 MED ORDER — GUAIFENESIN 100 MG/5ML PO LIQD: 5.0000 mL | ORAL | Status: DC | PRN

## 2021-11-10 MED ORDER — SENNOSIDES-DOCUSATE SODIUM 8.6-50 MG PO TABS: 1.0000 | ORAL_TABLET | Freq: Every evening | ORAL | Status: DC | PRN

## 2021-11-10 MED ORDER — TRAZODONE HCL 50 MG PO TABS: 50.0000 mg | ORAL_TABLET | Freq: Every evening | ORAL | Status: DC | PRN

## 2021-11-10 NOTE — ED Notes (Signed)
Visual acuity. Left 20/63 Right 20/80 Uses reading glasses only. Hx cataract.

## 2021-11-10 NOTE — ED Notes (Signed)
Dr. Kakrakandy at bedside. 

## 2021-11-10 NOTE — ED Notes (Signed)
Pt had 2 sm skin tear to R forearm, from bathroom door.Wound cleaned, New dressing applied.

## 2021-11-10 NOTE — ED Notes (Signed)
Denies CP, SOB no worse than normal. COPD baseline. Does not use O2 at home.

## 2021-11-10 NOTE — ED Notes (Signed)
Pt taken to CT.

## 2021-11-10 NOTE — H&P (Signed)
History and Physical    Tony Williams RFX:[id number] DOB: 22-Jun-1945 DOA: 11/09/2021  PCP: Virginia Crews, MD  Patient coming from: Home.  Chief Complaint: Headache and elevated blood pressure.  HPI: Tony Williams is a 75 y.o. male with history of CAD s/p CABG and stenting recently admitted at [facility] about 10 days ago with chest pain and hypertensive urgency stress test did not show any reversible ischemia was discharged home on amlodipine and patient's lisinopril was discontinued due to renal failure presents to the ER after patient started having global headache over the last 24 hours and elevated blood pressure.  Patient took 3 of his amlodipine dose.  ED Course: In the ER patient appears nonfocal CT head is unremarkable.  Blood pressure systolic was more than 264.  Was given labetalol following which patient's blood pressure improved symptoms improved.  Patient admitted for further observation.  Review of Systems: As per HPI, rest all negative.   Past Medical History:  Diagnosis Date   Bladder cancer ([place]) 2013   CAD (coronary artery disease)    a. 2007 CABG x 4: LIMA->D1, VG->OM1, VG->D2->mLAD; b. 2018 NSTEMI/PCI: DES to VG->OM; c. 10/2020 NSTEMI/PCI: LM 90d, LAD 114m D1 100, LCX 100ost/p, RCA sev diff dzs, VG->D2->mLAD 99/90 before D2 (4.0x15 [employer] [facility] DES), 90 before mLAD (4.0x15 [employer] DES), LIMA->D1 ok, VG->OM1 100.   Chronic heart [place] with [place] fraction ([place]pEF) [place]    a. 10/2020 Echo: EF 50-55%, apical HK, GrII DD, mod dil LA; b. 11/2020 Echo: EF 60-65%, no rwma.   Chronic kidney disease    COPD (chronic obstructive pulmonary disease) (HCC)    Depression    Diabetes mellitus without complication (HCC)    Hx of CABG x 4    LIMA-D2, SVG-OM (occluded), SeqSVG-D1-LAD.   Labile hypertension    Myocardial infarction ([facility]     Past Surgical History:  Procedure Laterality Date   CHOLECYSTECTOMY, LAPAROSCOPIC  12/26/1995   COLONOSCOPY WITH  PROPOFOL N/A 09/23/2020   Procedure: COLONOSCOPY WITH PROPOFOL;  Surgeon: WLucilla Lame MD;  [facility];  Service: Endoscopy;  Laterality: N/A;   CORONARY ARTERY BYPASS GRAFT     LIMA-D2, SVG-OM, SeqSVG-D1-LAD   CORONARY STENT INTERVENTION N/A 11/12/2020   Procedure: CORONARY STENT INTERVENTION;  Surgeon: AWellington Hampshire MD;  [facility];  Service: Cardiovascular;  Laterality: N/A;   EYE SURGERY     IABP INSERTION N/A 11/11/2020   Procedure: IABP Insertion;  Surgeon: ENelva Bush MD;  [facility];  Service: Cardiovascular;  Laterality: N/A;   IR STENT PLACEMENT ANTE CAROTID INC ANGIO     KNEE SURGERY     RIGHT/LEFT HEART CATH AND CORONARY/GRAFT ANGIOGRAPHY N/A 11/11/2020   Procedure: RIGHT/LEFT HEART CATH AND CORONARY/GRAFT ANGIOGRAPHY;  Surgeon: ENelva Bush MD;  [facility];  Service: Cardiovascular;  Severe native CAD: 90% dLMCA-100% mLAD/100% ost LCx CTO w/ Severe diffuse RCA disease. Widely patent LIMA-D1. Patent sequential SVG-D2-LAD with 99% mid graft & 90% LIMA-D2 @ anastomosis. previously stented SVG-OM - CTO. Mildly   TRANSURETHRAL RESECTION OF PROSTATE  1994   URINARY SPHINCTER IMPLANT  1995     reports that he quit smoking about 18 years ago. His smoking use included cigarettes. He has a 30.00 pack-year smoking history. He has never used smokeless tobacco. He reports that he does not drink alcohol and does not use drugs.  Allergies  Allergen Reactions   Butorphanol Itching   Morphine Other (See Comments)  Redness around IV site   Pedi-Pre Tape Spray [Wound Dressing Adhesive] Itching   Simvastatin Other (See Comments)    "Destroyed my muscles"   Statins Other (See Comments)    "Destroyed my muscles"    Family History  Problem Relation Age of Onset   Heart Problems Mother    Heart Problems Father     Prior to Admission medications   Medication Sig Start Date End Date Taking? Authorizing  Provider  albuterol (VENTOLIN HFA) 108 (90 Base) MCG/ACT inhaler Inhale 2 puffs into the lungs every 6 (six) hours as needed for wheezing or shortness of breath. 09/25/21   Virginia Crews, MD  allopurinol (ZYLOPRIM) 100 MG tablet Take 1 tablet (100 mg total) by mouth daily. 10/31/21 10/31/22  Wouk, Ailene Rud, MD  amLODipine (NORVASC) 5 MG tablet Take 1 tablet (5 mg total) by mouth daily. 11/01/21   Wouk, Ailene Rud, MD  aspirin EC 81 MG tablet Take 81 mg by mouth daily. Swallow whole.    [provider]  blood glucose meter kit and supplies Dispense based on patient and insurance preference. Use up to four times daily as directed. (FOR ICD-10 E10.9, E11.9). check blood glucose fasting and before meals four times daily. 04/18/20   Flinchum, Kelby Aline, FNP  Budeson-Glycopyrrol-Formoterol (BREZTRI AEROSPHERE) 160-9-4.8 MCG/ACT AERO Inhale 2 puffs into the lungs 2 (two) times daily. 01/27/21   Parrett, Fonnie Mu, NP  Cholecalciferol (VITAMIN D3 PO) Take 4,000 Units by mouth.    [provider]  Coenzyme Q10 (CO Q-10) 400 MG CAPS Take by mouth daily at 6 (six) AM.    [provider]  desoximetasone (TOPICORT) 0.25 % cream Apply 1 application. topically 2 (two) times daily. 08/06/21   Virginia Crews, MD  escitalopram (LEXAPRO) 20 MG tablet TAKE 1 TABLET(20 MG) BY MOUTH DAILY 10/15/21   Bacigalupo, Dionne Bucy, MD  fenofibrate (TRICOR) 145 MG tablet Take 1 tablet (145 mg total) by mouth daily. 07/24/21   Kate Sable, MD  gabapentin (NEURONTIN) 300 MG capsule Take 1 capsule (300 mg total) by mouth 2 (two) times daily AND 2 capsules (600 mg total) at bedtime. 01/29/21   Bacigalupo, Dionne Bucy, MD  glimepiride (AMARYL) 1 MG tablet TAKE 1 TABLET(1 MG) BY MOUTH DAILY WITH BREAKFAST 11/09/21   Ostwalt, Letitia Libra, PA-C  HYDROcodone-acetaminophen (NORCO/VICODIN) 5-325 MG tablet Take 1 tablet by mouth every 6 (six) hours as needed for moderate pain. 11/03/21   Gwyneth Sprout, FNP   lovastatin (MEVACOR) 40 MG tablet TAKE 1 TABLET(40 MG) BY MOUTH DAILY 08/13/21   Bacigalupo, Dionne Bucy, MD  nitroGLYCERIN (NITROSTAT) 0.4 MG SL tablet Place 1 tablet (0.4 mg total) under the tongue every 5 (five) minutes as needed for chest pain. 10/26/21 01/24/22  Kate Sable, MD  Omega-3 Fatty Acids (FISH OIL) 1200 MG CAPS Take by mouth daily at 6 (six) AM.    [provider]  prasugrel (EFFIENT) 10 MG TABS tablet Take 1 tablet (10 mg total) by mouth daily. 11/09/21   Kate Sable, MD  torsemide (DEMADEX) 20 MG tablet Take 1 tablet (20 mg total) by mouth daily as needed. 09/28/21   Kate Sable, MD  traZODone (DESYREL) 150 MG tablet Take 1 tablet (150 mg total) by mouth at bedtime. 04/16/21   Virginia Crews, MD    Physical Exam: Constitutional: Moderately built and nourished. Vitals:   11/10/21 0144 11/10/21 0313 11/10/21 0605 11/10/21 0627  BP:  (!) 150/66 (!) 167/143   Pulse:  Marland Kitchen)  58 (!) 56   Resp:  20 15   Temp:    97.7 F (36.5 C)  TempSrc:    Oral  SpO2: 97% 98% 95%    Eyes: Anicteric no pallor. ENMT: No discharge from the ears eyes nose and mouth. Neck: No mass felt.  No neck rigidity. Respiratory: No rhonchi or crepitations. Cardiovascular: S1-S2 heard. Abdomen: Soft nontender bowel sounds present. Musculoskeletal: No edema. Skin: No rash. Neurologic: Alert awake oriented to time place and person.  Moves all extremities. Psychiatric: Appears normal.  Normal affect.   Labs on Admission: I have personally reviewed following labs and imaging studies  CBC: Recent Labs  Lab 11/10/21 0033  WBC 7.0  HGB 14.8  HCT 47.2  MCV 93.5  PLT 419   Basic Metabolic Panel: Recent Labs  Lab 11/10/21 0033  NA 137  K 3.9  CL 105  CO2 22  GLUCOSE 116*  BUN 33*  CREATININE 1.98*  CALCIUM 9.1   GFR: Estimated Creatinine Clearance: 42.6 mL/min (A) (by C-G formula based on SCr of 1.98 mg/dL (H)). Liver Function Tests: No results for input(s):  "AST", "ALT", "ALKPHOS", "BILITOT", "PROT", "ALBUMIN" in the last 168 hours. No results for input(s): "LIPASE", "AMYLASE" in the last 168 hours. No results for input(s): "AMMONIA" in the last 168 hours. Coagulation Profile: No results for input(s): "INR", "PROTIME" in the last 168 hours. Cardiac Enzymes: No results for input(s): "CKTOTAL", "CKMB", "CKMBINDEX", "TROPONINI" in the last 168 hours. BNP (last 3 results) Recent Labs    09/17/21 1547  PROBNP 715*   HbA1C: No results for input(s): "HGBA1C" in the last 72 hours. CBG: No results for input(s): "GLUCAP" in the last 168 hours. Lipid Profile: No results for input(s): "CHOL", "HDL", "LDLCALC", "TRIG", "CHOLHDL", "LDLDIRECT" in the last 72 hours. Thyroid Function Tests: No results for input(s): "TSH", "T4TOTAL", "FREET4", "T3FREE", "THYROIDAB" in the last 72 hours. Anemia Panel: No results for input(s): "VITAMINB12", "FOLATE", "FERRITIN", "TIBC", "IRON", "RETICCTPCT" in the last 72 hours. Urine analysis:    Component Value Date/Time   COLORURINE YELLOW (A) 10/30/2021 1515   APPEARANCEUR CLEAR (A) 10/30/2021 1515   LABSPEC 1.015 10/30/2021 1515   PHURINE 5.0 10/30/2021 1515   GLUCOSEU >=500 (A) 10/30/2021 1515   HGBUR SMALL (A) 10/30/2021 1515   BILIRUBINUR NEGATIVE 10/30/2021 1515   BILIRUBINUR negative 04/18/2020 1349   KETONESUR NEGATIVE 10/30/2021 1515   PROTEINUR 100 (A) 10/30/2021 1515   UROBILINOGEN 0.2 04/18/2020 1349   NITRITE NEGATIVE 10/30/2021 1515   LEUKOCYTESUR NEGATIVE 10/30/2021 1515   Sepsis Labs: _0 (procalcitonin:4,lacticidven:4) )No results found for this or any previous visit (from the past 240 hour(s)).   Radiological Exams on Admission: CT Head Wo Contrast  Result Date: 11/10/2021 CLINICAL DATA:  Headache, hypertension, blurred vision. EXAM: CT HEAD WITHOUT CONTRAST TECHNIQUE: Contiguous axial images were obtained from the base of the skull through the vertex without intravenous contrast.  RADIATION DOSE REDUCTION: This exam was performed according to the departmental dose-optimization program which includes automated exposure control, adjustment of the mA and/or kV according to patient size and/or use of iterative reconstruction technique. COMPARISON:  03/11/2021. FINDINGS: Brain: No acute intracranial hemorrhage, midline shift or mass effect. No extra-axial fluid collection. Subcortical and periventricular white matter hypodensities are present bilaterally. No hydrocephalus. Diffuse atrophy is noted. 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 Vascular: Atherosclerotic calcification of the carotid siphons and vertebral arteries. No hyperdense vessel. Skull: Normal. Negative for fracture or focal lesion. Sinuses/Orbits: No acute  finding. Other: None. IMPRESSION: 1. No acute intracranial process. 2. 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 Electronically Signed   By: Brett Fairy M.D.   On: 11/10/2021 03:49     Assessment/Plan Principal Problem:   Hypertensive urgency Active Problems:   Controlled type 2 diabetes mellitus with diabetic polyneuropathy, without long-term current use of insulin (HCC)   S/P primary angioplasty with coronary stent   Chronic gout due to renal impairment without tophus    Hypertensive urgency likely contributing to patient's symptoms.  We will continue patient's amlodipine I am going to add hydralazine 25 p.o. every 8.  Continue as needed IV labetalol follow blood pressure trends. Head ache likely from hypertensive urgency improved.  Continue to monitor CT head unremarkable patient appears nonfocal. CAD status post CABG recent stress test done this month was unremarkable.  Continue antiplatelet agents and statins.  Not sure why patient is not on beta-blockers.  EKG is pending. Diabetes mellitus type 2 last hemoglobin A1c was 7.7.  On sliding scale coverage. CKD stage III creatinine is  improving.  Patient was lisinopril was recently discontinued due to renal failure. Sleep apnea on CPAP. Gout on allopurinol.   DVT prophylaxis: Lovenox. Code Status: Full code. Family Communication: Discussed with patient. Disposition Plan: Home. Consults called: None. Admission status: Observation.   Rise Patience MD [facility] Pager [phone].  If 7PM-7AM, please contact night-coverage [email or web address] Password [facility]  11/10/2021, 6:43 AM

## 2021-11-10 NOTE — ED Notes (Signed)
Pt hits arm on door knob walking to bathroom. Two skin tears visible on right arm. Dressed with non-stick gauze, bacitracin, and ace wrap. Bleeding minimal.

## 2021-11-10 NOTE — Progress Notes (Signed)
PROGRESS NOTE    Tony Williams  DUK:[id number] DOB: 02-11-1946 DOA: 11/09/2021 PCP: Virginia Crews, MD   Brief Narrative:  76 y.o. male with history of CAD s/p CABG and stenting recently admitted at [facility] about 10 days ago with chest pain and hypertensive urgency stress test did not show any reversible ischemia was discharged home on amlodipine and patient's lisinopril was discontinued due to renal failure presents to the ER after patient started having global headache over the last 24 hours and elevated blood pressure.  Patient took 3 of his amlodipine dose.  In the ER CT head was negative and systolic blood pressure was greater than 200.   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 3 times daily.  We will add IV as needed medications. - CT head is negative.  CAD status post CABG - Stress test done about a month ago which was unremarkable. - He is already on aspirin, Effient  DM2 - On sliding scale and Accu-Chek.  Recent A1c 7.7. Amaryl on hold  CKD stage IIIa - Baseline creatinine around 2.0.  Continue to monitor.  Sleep apnea on CPAP  Gout - Allopurinol   DVT prophylaxis: Lovenox Code Status: Full Family Communication:  Called son; updated.   Maintain hospital stay for better blood pressure control and to help with his headaches.   Subjective:  Having headaches and back aches. Think his back ache is chronic but currently exacerbated by ED bed.    Examination: Constitutional: Not in acute distress Respiratory: Clear to auscultation bilaterally Cardiovascular: Normal sinus rhythm, no rubs Abdomen: Nontender  nondistended good bowel sounds Musculoskeletal: No edema noted Skin: No rashes seen Neurologic: CN 2-12 grossly intact.  And nonfocal Psychiatric: Normal judgment and insight. Alert and oriented x 3. Normal mood.     Objective: Vitals:   11/10/21 0144 11/10/21 0313 11/10/21 0605 11/10/21 0627  BP:  (!) 150/66 (!) 167/143   Pulse:  (!) 58 (!) 56   Resp:  20 15   Temp:    97.7 F (36.5 C)  TempSrc:    Oral  SpO2: 97% 98% 95%    No intake or output data in the 24 hours ending 11/10/21 0807 There were no vitals filed for this visit.   Data Reviewed:   CBC: Recent Labs  Lab 11/10/21 0033 11/10/21 0700  WBC 7.0 6.0  HGB 14.8 13.9  HCT 47.2 43.6  MCV 93.5 93.6  PLT 278 376   Basic Metabolic Panel: Recent Labs  Lab 11/10/21 0033 11/10/21 0700  NA 137  --   K 3.9  --   CL 105  --   CO2 22  --   GLUCOSE 116*  --   BUN 33*  --   CREATININE 1.98* 2.10*  CALCIUM 9.1  --    GFR: Estimated Creatinine Clearance: 40.2 mL/min (A) (by C-G formula based on SCr of 2.1 mg/dL (H)). Liver Function Tests: No results for input(s): "AST", "ALT", "ALKPHOS", "BILITOT", "PROT", "ALBUMIN" in the last 168 hours. No results for input(s): "LIPASE", "AMYLASE" in the last 168 hours. No results for input(s): "AMMONIA" in the last 168 hours. Coagulation Profile: No results for input(s): "  INR", "PROTIME" in the last 168 hours. Cardiac Enzymes: No results for input(s): "CKTOTAL", "CKMB", "CKMBINDEX", "TROPONINI" in the last 168 hours. BNP (last 3 results) Recent Labs    09/17/21 1547  PROBNP 715*   HbA1C: No results for input(s): "HGBA1C" in the last 72 hours. CBG: No results for input(s): "GLUCAP" in the last 168 hours. Lipid Profile: No results for input(s): "CHOL", "HDL", "LDLCALC", "TRIG", "CHOLHDL", "LDLDIRECT" in the last 72 hours. Thyroid Function Tests: No results for input(s): "TSH", "T4TOTAL", "FREET4", "T3FREE", "THYROIDAB" in the last 72 hours. Anemia Panel: No results  for input(s): "VITAMINB12", "FOLATE", "FERRITIN", "TIBC", "IRON", "RETICCTPCT" in the last 72 hours. Sepsis Labs: No results for input(s): "PROCALCITON", "LATICACIDVEN" in the last 168 hours.  No results found for this or any previous visit (from the past 240 hour(s)).       Radiology Studies: CT Head Wo Contrast  Result Date: 11/10/2021 CLINICAL DATA:  Headache, hypertension, blurred vision. EXAM: CT HEAD WITHOUT CONTRAST TECHNIQUE: Contiguous axial images were obtained from the base of the skull through the vertex without intravenous contrast. RADIATION DOSE REDUCTION: This exam was performed according to the departmental dose-optimization program which includes automated exposure control, adjustment of the mA and/or kV according to patient size and/or use of iterative reconstruction technique. COMPARISON:  03/11/2021. FINDINGS: Brain: No acute intracranial hemorrhage, midline shift or mass effect. No extra-axial fluid collection. Subcortical and periventricular white matter hypodensities are present bilaterally. No hydrocephalus. Diffuse atrophy is noted. 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 Vascular: Atherosclerotic calcification of the carotid siphons and vertebral arteries. No hyperdense vessel. Skull: Normal. Negative for fracture or focal lesion. Sinuses/Orbits: No acute finding. Other: None. IMPRESSION: 1. No acute intracranial process. 2. 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 Electronically Signed   By: Brett Fairy M.D.   On: 11/10/2021 03:49        Scheduled Meds:  allopurinol  100 mg Oral Daily   amLODipine  5 mg Oral Daily   aspirin EC  81 mg Oral Daily   enoxaparin (LOVENOX) injection  40 mg Subcutaneous Q24H   fluticasone furoate-vilanterol  1 puff Inhalation Daily   hydrALAZINE  25 mg Oral Q8H   insulin aspart  0-9 Units Subcutaneous TID WC   prasugrel  10 mg Oral Daily   pravastatin  40  mg Oral q1800   umeclidinium bromide  1 puff Inhalation Daily   Continuous Infusions:   LOS: 0 days   Time spent= 35 mins    Darcelle Herrada Arsenio Loader, MD [facility]  If 7PM-7AM, please contact night-coverage  11/10/2021, 8:07 AM

## 2021-11-11 ENCOUNTER — Observation Stay (HOSPITAL_COMMUNITY): Payer: Medicare Other

## 2021-11-11 DIAGNOSIS — E1122 Type 2 diabetes mellitus with diabetic chronic kidney disease: Secondary | ICD-10-CM

## 2021-11-11 DIAGNOSIS — N1831 Chronic kidney disease, stage 3a: Secondary | ICD-10-CM

## 2021-11-11 DIAGNOSIS — R0902 Hypoxemia: Secondary | ICD-10-CM | POA: Diagnosis not present

## 2021-11-11 DIAGNOSIS — I160 Hypertensive urgency: Secondary | ICD-10-CM | POA: Diagnosis not present

## 2021-11-11 DIAGNOSIS — G4733 Obstructive sleep apnea (adult) (pediatric): Secondary | ICD-10-CM

## 2021-11-11 DIAGNOSIS — I25810 Atherosclerosis of coronary artery bypass graft(s) without angina pectoris: Secondary | ICD-10-CM

## 2021-11-11 LAB — CBC
HCT: 43.6 % (ref 39.0–52.0)
Hemoglobin: 14.2 g/dL (ref 13.0–17.0)
MCH: 30 pg (ref 26.0–34.0)
MCHC: 32.6 g/dL (ref 30.0–36.0)
MCV: 92 fL (ref 80.0–100.0)
Platelets: 256 10*3/uL (ref 150–400)
RBC: 4.74 MIL/uL (ref 4.22–5.81)
RDW: 14.7 % (ref 11.5–15.5)
WBC: 6.4 10*3/uL (ref 4.0–10.5)
nRBC: 0 % (ref 0.0–0.2)

## 2021-11-11 LAB — COMPREHENSIVE METABOLIC PANEL
ALT: 39 U/L (ref 0–44)
AST: 45 U/L — ABNORMAL HIGH (ref 15–41)
Albumin: 3.2 g/dL — ABNORMAL LOW (ref 3.5–5.0)
Alkaline Phosphatase: 29 U/L — ABNORMAL LOW (ref 38–126)
Anion gap: 12 (ref 5–15)
BUN: 33 mg/dL — ABNORMAL HIGH (ref 8–23)
CO2: 27 mmol/L (ref 22–32)
Calcium: 9.2 mg/dL (ref 8.9–10.3)
Chloride: 100 mmol/L (ref 98–111)
Creatinine, Ser: 2.29 mg/dL — ABNORMAL HIGH (ref 0.61–1.24)
GFR, Estimated: 29 mL/min — ABNORMAL LOW (ref >60)
Glucose, Bld: 135 mg/dL — ABNORMAL HIGH (ref 70–99)
Potassium: 4.7 mmol/L (ref 3.5–5.1)
Sodium: 139 mmol/L (ref 135–145)
Total Bilirubin: 0.8 mg/dL (ref 0.3–1.2)
Total Protein: 6.1 g/dL — ABNORMAL LOW (ref 6.5–8.1)

## 2021-11-11 LAB — GLUCOSE, CAPILLARY
Glucose-Capillary: 125 mg/dL — ABNORMAL HIGH (ref 70–99)
Glucose-Capillary: 147 mg/dL — ABNORMAL HIGH (ref 70–99)
Glucose-Capillary: 184 mg/dL — ABNORMAL HIGH (ref 70–99)

## 2021-11-11 LAB — BRAIN NATRIURETIC PEPTIDE: B Natriuretic Peptide: 226.2 pg/mL — ABNORMAL HIGH (ref 0.0–100.0)

## 2021-11-11 LAB — MRSA NEXT GEN BY PCR, NASAL: MRSA by PCR Next Gen: NOT DETECTED

## 2021-11-11 LAB — MAGNESIUM: Magnesium: 2 mg/dL (ref 1.7–2.4)

## 2021-11-11 MED ORDER — CLOPIDOGREL BISULFATE 75 MG PO TABS
75.0000 mg | ORAL_TABLET | Freq: Every day | ORAL | Status: DC
  Administered 2021-11-12: 75 mg via ORAL
  Filled 2021-11-11: qty 1

## 2021-11-11 MED ORDER — HYDRALAZINE HCL 50 MG PO TABS
50.0000 mg | ORAL_TABLET | Freq: Three times a day (TID) | ORAL | Status: DC
  Administered 2021-11-11 – 2021-11-12 (×3): 50 mg via ORAL
  Filled 2021-11-11 (×3): qty 1

## 2021-11-11 MED ORDER — ONDANSETRON HCL 4 MG/2ML IJ SOLN
4.0000 mg | Freq: Four times a day (QID) | INTRAMUSCULAR | Status: DC | PRN
  Administered 2021-11-11: 4 mg via INTRAVENOUS
  Filled 2021-11-11: qty 2

## 2021-11-11 MED ORDER — LORATADINE 10 MG PO TABS
10.0000 mg | ORAL_TABLET | Freq: Every day | ORAL | Status: DC
  Administered 2021-11-11: 10 mg via ORAL
  Filled 2021-11-11: qty 1

## 2021-11-11 MED ORDER — ORAL CARE MOUTH RINSE
15.0000 mL | OROMUCOSAL | Status: DC | PRN
Start: 2021-11-11 — End: 2021-11-12

## 2021-11-11 MED ORDER — FUROSEMIDE 10 MG/ML IJ SOLN
20.0000 mg | Freq: Once | INTRAMUSCULAR | Status: DC
  Filled 2021-11-11: qty 2

## 2021-11-11 NOTE — Plan of Care (Signed)
  Problem: Cardiac: Goal: Ability to achieve and maintain adequate cardiovascular perfusion will improve Outcome: Progressing   Problem: Coping: Goal: Ability to adjust to condition or change in health will improve Outcome: Progressing   Problem: Fluid Volume: Goal: Ability to maintain a balanced intake and output will improve Outcome: Progressing   Problem: Clinical Measurements: Goal: Respiratory complications will improve Outcome: Progressing Goal: Cardiovascular complication will be avoided Outcome: Progressing   Problem: Activity: Goal: Risk for activity intolerance will decrease Outcome: Progressing   Problem: Coping: Goal: Level of anxiety will decrease Outcome: Progressing   Problem: Pain Managment: Goal: General experience of comfort will improve Outcome: Not Progressing

## 2021-11-11 NOTE — Care Management Obs Status (Signed)
[place] NOTIFICATION   Patient Details  Name: Tony Williams MRN: [id number] Date of Birth: 1945-08-30   Medicare Observation Status Notification Given:  Yes    Angelita Ingles, RN 11/11/2021, 9:14 AM

## 2021-11-11 NOTE — Progress Notes (Signed)
Mobility Specialist Progress Note    11/11/21 1522  Mobility  Activity Ambulated independently in hallway  Level of Assistance Standby assist, set-up cues, supervision of patient - no hands on  Assistive Device None  Distance Ambulated (ft) 270 ft  Activity Response Tolerated fair  $Mobility charge 1 Mobility   Pre-Mobility: 81 HR, 123/53 BP, 90% on RA SpO2 During Mobility: 83% on RA SpO2 Post-Mobility: 58 HR, 147/64 BP, 90% on 2LO2 SpO2  Pt received in bed and agreeable. Required 3LO2 to maintain SpO2=90% during. C/o some lightheadedness and dizziness and like a small headache was coming on upon return. Left in bed with call bell in reach. RN notified and advised to leave on 2LO2 in room.  Hildred Alamin Mobility Specialist

## 2021-11-11 NOTE — TOC Progression Note (Signed)
Transition of Care [facility]) - Progression Note    Patient Details  Name: Tony Williams MRN: [id number] Date of Birth: 1945/10/01  Transition of Care [facility]) CM/SW [place], RN Phone [phone]  11/11/2021, 3:25 PM  Clinical Narrative:     Transition of Care [facility]) Screening Note   Patient Details  Name: Tony Williams Date of Birth: [birth year]   Transition of Care [facility]) CM/SW Contact:    Angelita Ingles, RN Phone Number: 11/11/2021, 3:25 PM    Transition of Care Department [facility]) has reviewed patient and no TOC needs have been identified at this time. We will continue to monitor patient advancement through interdisciplinary progression rounds.           Expected Discharge Plan and Services                                                 Social Determinants of Health (SDOH) Interventions    Readmission Risk Interventions    12/11/2020   11:06 AM  Readmission Risk Prevention Plan  Transportation Screening Complete  PCP or Specialist Appt within 5-7 Days Complete  Medication Review (RN CM) Complete

## 2021-11-11 NOTE — Progress Notes (Signed)
SATURATION QUALIFICATIONS: (This note is used to comply with regulatory documentation for home oxygen)  Patient Saturations on Room Air at Rest = 87%  Patient Saturations on Room Air while Ambulating = 86%  Patient Saturations on 2 Liters of oxygen while Ambulating = 92%  Please briefly explain why patient needs home oxygen:

## 2021-11-11 NOTE — Progress Notes (Signed)
PROGRESS NOTE    Tony Williams  GGY:[id number] DOB: [birth year] DOA: 11/09/2021 PCP: Virginia Crews, MD    Chief Complaint  Patient presents with   Hypertension    Brief Narrative:  76 y.o. male with history of CAD s/p CABG and stenting recently admitted at [facility] about 10 days ago with chest pain and hypertensive urgency stress test did not show any reversible ischemia was discharged home on amlodipine and patient's lisinopril was discontinued due to renal failure presents to the ER after patient started having global headache over the last 24 hours and elevated blood pressure.  Patient took 3 of his amlodipine dose.  In the ER CT head was negative and systolic blood pressure was greater than 200.    Assessment & Plan:   Principal Problem:   Hypertensive urgency Active Problems:   Controlled type 2 diabetes mellitus with diabetic polyneuropathy, without long-term current use of insulin (HCC)   S/P primary angioplasty with coronary stent   Chronic gout due to renal impairment without tophus   #1 hypertensive urgency -BP improved on current regimen of Norvasc 10 mg daily, hydralazine 25 mg 3 times daily, IV meds as needed. -Head CT negative.  2.  CAD status post CABG -Patient noted to have had a stress test done approximate month ago which was unremarkable. -Continue aspirin. -Was on Effient however in discussions by pharmacy with patient's cardiac interventionist/cardiology decision was made to transition from Effient to Plavix. -Outpatient follow-up with cardiology.  3.  Diabetes mellitus type 2 -Hemoglobin A1c 7.7 (10/27/2021) -Continue to hold oral hypoglycemic agents of Amaryl. -SSI.  4.  Hypoxia -Patient noted to have an hypoxia requiring O2 on ambulation. -Patient states prior tobacco history approximately 20 years ago. -Patient noted not on O2 prior to admission. -Check a chest x-ray, check a BNP. -Patient noted to be on Demadex at home as needed. -May need  home O2.  5.  CKD stage IIIa -Baseline creatinine approximately 2.0. -Creatinine currently at 2.29. -Follow.  6.  Sleep apnea on CPAP -CPAP nightly.  7.  Gout -Allopurinol.  8.   DVT prophylaxis: Lovenox Code Status: Full Family Communication: Updated patient.  No family at bedside. Disposition: Home once blood pressure improved, hypoxia improved hopefully in the next 1 to 2 days.  Status is: Observation The patient remains OBS appropriate and will d/c before 2 midnights.   Consultants:  None  Procedures:  CT head 11/10/2021  Antimicrobials:  None   Subjective: Patient sitting up in bed.  Denies any chest pain.  No shortness of breath.  No headache.  Overall feeling better.  Hoping to go home today.  Complain of back pain which she states is from the bed.  Objective: Vitals:   11/10/21 2344 11/11/21 0315 11/11/21 0839 11/11/21 0845  BP: (!) 154/86 (!) 124/49 (!) 160/59   Pulse: 61 (!) 51 65   Resp: '18 13 14   '$ Temp: 98.1 F (36.7 C) 97.7 F (36.5 C) 97.7 F (36.5 C)   TempSrc: Oral Oral Oral   SpO2: 95% 94% 90% 95%  Weight:      Height:        Intake/Output Summary (Last 24 hours) at 11/11/2021 1116 Last data filed at 11/10/2021 2344 Gross per 24 hour  Intake 240 ml  Output 600 ml  Net -360 ml   Filed Weights   11/10/21 0832 11/10/21 1946  Weight: 124.3 kg 123.5 kg    Examination:  General exam: Appears calm and comfortable  Respiratory  system: Clear to auscultation.  Decreased breath sounds in the bases.  No wheezing.  Fair air movement.  Respiratory effort normal. Cardiovascular system: S1 & S2 heard, RRR. No JVD, murmurs, rubs, gallops or clicks. No pedal edema. Gastrointestinal system: Abdomen is nondistended, soft and nontender. No organomegaly or masses felt. Normal bowel sounds heard. Central nervous system: Alert and oriented. No focal neurological deficits. Extremities: Symmetric 5 x 5 power. Skin: No rashes, lesions or ulcers Psychiatry:  Judgement and insight appear normal. Mood & affect appropriate.     Data Reviewed: I have personally reviewed following labs and imaging studies  CBC: Recent Labs  Lab 11/10/21 0033 11/10/21 0700 11/11/21 0227  WBC 7.0 6.0 6.4  HGB 14.8 13.9 14.2  HCT 47.2 43.6 43.6  MCV 93.5 93.6 92.0  PLT 278 264 973    Basic Metabolic Panel: Recent Labs  Lab 11/10/21 0033 11/10/21 0700 11/11/21 0227  NA 137  --  139  K 3.9  --  4.7  CL 105  --  100  CO2 22  --  27  GLUCOSE 116*  --  135*  BUN 33*  --  33*  CREATININE 1.98* 2.10* 2.29*  CALCIUM 9.1  --  9.2  MG  --   --  2.0    GFR: Estimated Creatinine Clearance: 36.7 mL/min (A) (by C-G formula based on SCr of 2.29 mg/dL (H)).  Liver Function Tests: Recent Labs  Lab 11/11/21 0227  AST 45*  ALT 39  ALKPHOS 29*  BILITOT 0.8  PROT 6.1*  ALBUMIN 3.2*    CBG: Recent Labs  Lab 11/10/21 0829 11/10/21 1232 11/10/21 1741 11/10/21 2113 11/11/21 0605  GLUCAP 125* 153* 143* 128* 125*     Recent Results (from the past 240 hour(s))  MRSA Next Gen by PCR, Nasal     Status: None   Collection Time: 11/11/21  1:00 AM   Specimen: Nasal Mucosa; Nasal Swab  Result Value Ref Range Status   MRSA by PCR Next Gen NOT DETECTED NOT DETECTED Final    Comment: (NOTE) The GeneXpert MRSA Assay (FDA approved for NASAL specimens only), is one component of a comprehensive MRSA colonization surveillance program. It is not intended to diagnose MRSA infection nor to guide or monitor treatment for MRSA infections. Test performance is not FDA approved in patients less than 4 years old. Performed at [facility], [place] [address] [place], [place] [address]          Radiology Studies: CT Head Wo Contrast  Result Date: 11/10/2021 CLINICAL DATA:  Headache, hypertension, blurred vision. EXAM: CT HEAD WITHOUT CONTRAST TECHNIQUE: Contiguous axial images were obtained from the base of the skull through the vertex without intravenous  contrast. RADIATION DOSE REDUCTION: This exam was performed according to the departmental dose-optimization program which includes automated exposure control, adjustment of the mA and/or kV according to patient size and/or use of iterative reconstruction technique. COMPARISON:  03/11/2021. FINDINGS: Brain: No acute intracranial hemorrhage, midline shift or mass effect. No extra-axial fluid collection. Subcortical and periventricular white matter hypodensities are present bilaterally. No hydrocephalus. Diffuse atrophy is noted. 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 Vascular: Atherosclerotic calcification of the carotid siphons and vertebral arteries. No hyperdense vessel. Skull: Normal. Negative for fracture or focal lesion. Sinuses/Orbits: No acute finding. Other: None. IMPRESSION: 1. No acute intracranial process. 2. 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 Electronically Signed  By: Brett Fairy M.D.   On: 11/10/2021 03:49        Scheduled Meds:  allopurinol  100 mg Oral Daily   amLODipine  10 mg Oral Daily   aspirin EC  81 mg Oral Daily   docusate sodium  100 mg Oral BID   enoxaparin (LOVENOX) injection  40 mg Subcutaneous Q24H   fluticasone furoate-vilanterol  1 puff Inhalation Daily   hydrALAZINE  25 mg Oral Q8H   insulin aspart  0-9 Units Subcutaneous TID WC   polyethylene glycol  17 g Oral Daily   prasugrel  10 mg Oral Daily   pravastatin  40 mg Oral q1800   umeclidinium bromide  1 puff Inhalation Daily   Continuous Infusions:   LOS: 0 days    Time spent: 40 minutes    Irine Seal, MD [facility]   To contact the attending provider between 7A-7P or the covering provider during after hours 7P-7A, please log into the web site [email or web address] and access using universal [place] password for that web site. If you do not have the password, please call the hospital operator.  11/11/2021,  11:16 AM

## 2021-11-11 NOTE — Progress Notes (Signed)
SATURATION QUALIFICATIONS: (This note is used to comply with regulatory documentation for home oxygen)  Patient Saturations on Room Air at Rest = 90%  Patient Saturations on Room Air while Ambulating = 83%  Patient Saturations on 3 Liters of oxygen while Ambulating = 90%

## 2021-11-12 ENCOUNTER — Other Ambulatory Visit (HOSPITAL_COMMUNITY): Payer: Self-pay

## 2021-11-12 DIAGNOSIS — R0902 Hypoxemia: Secondary | ICD-10-CM | POA: Diagnosis not present

## 2021-11-12 DIAGNOSIS — I160 Hypertensive urgency: Secondary | ICD-10-CM | POA: Diagnosis not present

## 2021-11-12 DIAGNOSIS — M1A30X0 Chronic gout due to renal impairment, unspecified site, without tophus (tophi): Secondary | ICD-10-CM | POA: Diagnosis not present

## 2021-11-12 DIAGNOSIS — I25810 Atherosclerosis of coronary artery bypass graft(s) without angina pectoris: Secondary | ICD-10-CM | POA: Diagnosis not present

## 2021-11-12 LAB — CBC
HCT: 42.3 % (ref 39.0–52.0)
Hemoglobin: 13.7 g/dL (ref 13.0–17.0)
MCH: 30 pg (ref 26.0–34.0)
MCHC: 32.4 g/dL (ref 30.0–36.0)
MCV: 92.8 fL (ref 80.0–100.0)
Platelets: 246 10*3/uL (ref 150–400)
RBC: 4.56 MIL/uL (ref 4.22–5.81)
RDW: 14.6 % (ref 11.5–15.5)
WBC: 6 10*3/uL (ref 4.0–10.5)
nRBC: 0 % (ref 0.0–0.2)

## 2021-11-12 LAB — RENAL FUNCTION PANEL
Albumin: 4.7 g/dL (ref 3.8–4.8)
BUN/Creatinine Ratio: 16 (ref 10–24)
BUN: 34 mg/dL — ABNORMAL HIGH (ref 8–27)
CO2: 21 mmol/L (ref 20–29)
Calcium: 9.2 mg/dL (ref 8.6–10.2)
Chloride: 102 mmol/L (ref 96–106)
Creatinine, Ser: 2.09 mg/dL — ABNORMAL HIGH (ref 0.76–1.27)
Glucose: 93 mg/dL (ref 70–99)
Phosphorus: 3.4 mg/dL (ref 2.8–4.1)
Potassium: 4.8 mmol/L (ref 3.5–5.2)
Sodium: 141 mmol/L (ref 134–144)
eGFR: 32 mL/min/{1.73_m2} — ABNORMAL LOW (ref >59)

## 2021-11-12 LAB — BASIC METABOLIC PANEL
Anion gap: 7 (ref 5–15)
BUN: 30 mg/dL — ABNORMAL HIGH (ref 8–23)
CO2: 26 mmol/L (ref 22–32)
Calcium: 9 mg/dL (ref 8.9–10.3)
Chloride: 105 mmol/L (ref 98–111)
Creatinine, Ser: 2.28 mg/dL — ABNORMAL HIGH (ref 0.61–1.24)
GFR, Estimated: 29 mL/min — ABNORMAL LOW (ref >60)
Glucose, Bld: 126 mg/dL — ABNORMAL HIGH (ref 70–99)
Potassium: 4.6 mmol/L (ref 3.5–5.1)
Sodium: 138 mmol/L (ref 135–145)

## 2021-11-12 LAB — MAGNESIUM: Magnesium: 2 mg/dL (ref 1.7–2.4)

## 2021-11-12 LAB — GLUCOSE, CAPILLARY
Glucose-Capillary: 130 mg/dL — ABNORMAL HIGH (ref 70–99)
Glucose-Capillary: 125 mg/dL — ABNORMAL HIGH (ref 70–99)

## 2021-11-12 MED ORDER — CLOPIDOGREL BISULFATE 75 MG PO TABS
75.0000 mg | ORAL_TABLET | Freq: Every day | ORAL | 1 refills | Status: DC
  Filled 2021-11-12: qty 30, 30d supply, fill #0

## 2021-11-12 MED ORDER — ACETAMINOPHEN 325 MG PO TABS: 650.0000 mg | ORAL_TABLET | Freq: Four times a day (QID) | ORAL | Status: DC | PRN

## 2021-11-12 MED ORDER — LORATADINE 10 MG PO TABS
10.0000 mg | ORAL_TABLET | Freq: Every day | ORAL | 1 refills | Status: DC
  Filled 2021-11-12: qty 30, 30d supply, fill #0

## 2021-11-12 MED ORDER — AMLODIPINE BESYLATE 10 MG PO TABS
10.0000 mg | ORAL_TABLET | Freq: Every day | ORAL | 1 refills | Status: DC
  Filled 2021-11-12: qty 30, 30d supply, fill #0

## 2021-11-12 MED ORDER — HYDRALAZINE HCL 50 MG PO TABS
50.0000 mg | ORAL_TABLET | Freq: Three times a day (TID) | ORAL | 1 refills | Status: DC
  Filled 2021-11-12: qty 90, 30d supply, fill #0

## 2021-11-12 NOTE — Progress Notes (Signed)
SATURATION QUALIFICATIONS: (This note is used to comply with regulatory documentation for home oxygen)  Patient Saturations on Room Air at Rest = 91%  Patient Saturations on Room Air while Ambulating = 88%   

## 2021-11-12 NOTE — Progress Notes (Signed)
Mobility Specialist Progress Note    11/12/21 1207  Mobility  Activity Ambulated independently in hallway  Level of Assistance Independent  Assistive Device None  Distance Ambulated (ft) 280 ft  Activity Response Tolerated well  $Mobility charge 1 Mobility   Pre-Mobility: 60 HR, 91% SpO2 During Mobility: 88% SpO2 Post-Mobility: 57 HR, 96% SpO2  Pt received sitting EOB and agreeable. No complaints on walk. SpO2 >/=88% on RA. Returned to sitting EOB with call bell in reach.    Tony Williams Mobility Specialist

## 2021-11-12 NOTE — Discharge Summary (Signed)
Physician Discharge Summary  Gaje Tennyson GYK:[id number] DOB: 10/10/1945 DOA: 11/09/2021  PCP: Virginia Crews, MD  Admit date: 11/09/2021 Discharge date: 11/12/2021  Time spent: 55 minutes  Recommendations for Outpatient Follow-up:  Follow-up with Virginia Crews, MD in 1 week.  On follow-up patient will need a basic metabolic profile done to follow-up on electrolytes and renal function.  Patient's blood pressure need to be reassessed for further titration for better blood pressure control may be done in the outpatient setting. Follow-up with primary cardiologist, Dr.Agbor-Etang as scheduled.   Discharge Diagnoses:  Principal Problem:   Hypertensive urgency Active Problems:   Type 2 diabetes mellitus with stage 3a chronic kidney disease, without long-term current use of insulin (HCC)   Coronary artery disease involving coronary bypass graft of native heart without angina pectoris   S/P primary angioplasty with coronary stent   Chronic gout due to renal impairment without tophus   Hypoxia   Discharge Condition: Stable and improved.  Diet recommendation: Heart healthy  Filed Weights   11/10/21 0832 11/10/21 1945  Weight: 124.3 kg 123.5 kg    History of present illness:  HPI per Dr. Francella Solian Freundlich is a 76 y.o. male with history of CAD s/p CABG and stenting recently admitted at [facility] about 10 days ago with chest pain and hypertensive urgency stress test did not show any reversible ischemia was discharged home on amlodipine and patient's lisinopril was discontinued due to renal failure presents to the ER after patient started having global headache over the last 24 hours and elevated blood pressure.  Patient took 3 of his amlodipine dose.   ED Course: In the ER patient appears nonfocal CT head is unremarkable.  Blood pressure systolic was more than 793.  Was given labetalol following which patient's blood pressure improved symptoms improved.  Patient admitted  for further observation.  Hospital Course:  #1 hypertensive urgency -Patient started on Norvasc and hydralazine with some improvement with hypertensive urgency.  Norvasc dose was increased to 10 mg daily, hydralazine increased to 50 mg 3 times daily with improvement with hypertension.   -Outpatient follow-up with PCP in 1 week for further blood pressure management and titration.  -Head CT negative.   2.  CAD status post CABG -Patient noted to have had a stress test done approximate month ago which was unremarkable. -Patient was maintained on aspirin. -Was on Effient however in discussions by pharmacy with patient's cardiac interventionist/cardiology decision was made to transition from Effient to Plavix. -Outpatient follow-up with cardiology.   3.  Diabetes mellitus type 2 -Hemoglobin A1c 7.7 (10/27/2021) -Patient's home regimen oral hypoglycemic agents of Amaryl was held during the hospitalization patient maintained on sliding scale insulin.   -Amaryl will be resumed on discharge.    4.  Hypoxia -Patient noted to have an hypoxia requiring O2 on ambulation. -Patient states prior tobacco history approximately 20 years ago. -Patient noted not on O2 prior to admission. -Chest x-ray done with no acute abnormalities noted however did note stable mild right basilar subsegmental atelectasis or scarring noted with small right pleural effusion.  -BNP noted at 226.   -Patient was not overtly volume overloaded on examination.   -Patient noted to be on Demadex at home as needed. -Lasix 20 mg IV x1 ordered however patient refused. -Patient given incentive spirometry, flutter valve and placed on CPAP overnight. -Hypoxia improved somewhat and by day of discharge patient with sats of 91% on room air at rest, 88% on ambulation and 96% post ambulation. -  Patient insistent on being discharged home and as such patient was discharged home in stable condition. -Outpatient follow-up with PCP, will need  ambulatory sats done on follow-up with PCP.   5.  CKD stage IIIa -Baseline creatinine approximately 2.0. -Creatinine stabilized at 2.28 by day of discharge.   -Patient's lisinopril discontinued/held during the hospitalization.   -Outpatient follow-up with PCP.    6.  Sleep apnea on CPAP -CPAP nightly.   7.  Gout -Patient maintained on home regimen allopurinol.  Procedures: CT head 11/10/2021  Consultations: None  Discharge Exam: Vitals:   11/12/21 0900 11/12/21 1052  BP:  (!) 162/66  Pulse:  (!) 52  Resp:  15  Temp:  97.6 F (36.4 C)  SpO2: 98% 95%    General: NAD Cardiovascular: Regular rate and rhythm no murmurs rubs or gallops.  No JVD.  No lower extremity edema. Respiratory: Clear to auscultation bilaterally.  No wheezes, no crackles, no rhonchi.  Discharge Instructions   Discharge Instructions     Diet - low sodium heart healthy   Complete by: As directed    Increase activity slowly   Complete by: As directed    Increase activity slowly   Complete by: As directed    No wound care   Complete by: As directed    No wound care   Complete by: As directed       Allergies as of 11/12/2021       Reactions   Butorphanol Itching   Morphine Other (See Comments)   Redness around IV site   Pedi-pre Tape Spray [wound Dressing Adhesive] Itching   Statins Other (See Comments)   "Destroyed my muscles"   Zocor [simvastatin] Other (See Comments)   "Destroyed my muscles"        Medication List     STOP taking these medications    prasugrel 10 MG Tabs tablet Commonly known as: Effient       TAKE these medications    acetaminophen 325 MG tablet Commonly known as: TYLENOL Take 2 tablets (650 mg total) by mouth every 6 (six) hours as needed for mild pain (or Fever >/= 101).   albuterol 108 (90 Base) MCG/ACT inhaler Commonly known as: VENTOLIN HFA Inhale 2 puffs into the lungs every 6 (six) hours as needed for wheezing or shortness of breath.    allopurinol 100 MG tablet Commonly known as: Zyloprim Take 1 tablet (100 mg total) by mouth daily.   amLODipine 10 MG tablet Commonly known as: NORVASC Take 1 tablet (10 mg total) by mouth daily. What changed:  medication strength how much to take   aspirin EC 81 MG tablet Take 81 mg by mouth daily. Swallow whole.   Breztri Aerosphere 160-9-4.8 MCG/ACT Aero Generic drug: Budeson-Glycopyrrol-Formoterol Inhale 2 puffs into the lungs 2 (two) times daily.   clopidogrel 75 MG tablet Commonly known as: PLAVIX Take 1 tablet (75 mg total) by mouth daily. Start taking on: November 13, 2021   Co Q-10 400 MG Caps Take 400 mg by mouth daily at 6 (six) AM.   desoximetasone 0.25 % cream Commonly known as: TOPICORT Apply 1 application. topically 2 (two) times daily.   escitalopram 20 MG tablet Commonly known as: LEXAPRO TAKE 1 TABLET(20 MG) BY MOUTH DAILY What changed: See the new instructions.   fenofibrate 145 MG tablet Commonly known as: Tricor Take 1 tablet (145 mg total) by mouth daily.   Fish Oil 1200 MG Caps Take 1,200 mg by mouth daily at 6 (  six) AM.   gabapentin 300 MG capsule Commonly known as: NEURONTIN Take 1 capsule (300 mg total) by mouth 2 (two) times daily AND 2 capsules (600 mg total) at bedtime.   glimepiride 1 MG tablet Commonly known as: AMARYL TAKE 1 TABLET(1 MG) BY MOUTH DAILY WITH BREAKFAST What changed:  how much to take how to take this when to take this additional instructions   hydrALAZINE 50 MG tablet Commonly known as: APRESOLINE Take 1 tablet (50 mg total) by mouth every 8 (eight) hours.   HYDROcodone-acetaminophen 5-325 MG tablet Commonly known as: NORCO/VICODIN Take 1 tablet by mouth every 6 (six) hours as needed for moderate pain.   loratadine 10 MG tablet Commonly known as: CLARITIN Take 1 tablet (10 mg total) by mouth daily.   lovastatin 40 MG tablet Commonly known as: MEVACOR TAKE 1 TABLET(40 MG) BY MOUTH DAILY What changed:  See the new instructions.   nitroGLYCERIN 0.4 MG SL tablet Commonly known as: NITROSTAT Place 1 tablet (0.4 mg total) under the tongue every 5 (five) minutes as needed for chest pain.   torsemide 20 MG tablet Commonly known as: DEMADEX Take 1 tablet (20 mg total) by mouth daily as needed. What changed: reasons to take this   traZODone 150 MG tablet Commonly known as: DESYREL Take 1 tablet (150 mg total) by mouth at bedtime.   VITAMIN D3 PO Take 4,000 Units by mouth.       Allergies  Allergen Reactions   Butorphanol Itching   Morphine Other (See Comments)    Redness around IV site   Pedi-Pre Tape Spray [Wound Dressing Adhesive] Itching   Statins Other (See Comments)    "Destroyed my muscles"   Zocor [Simvastatin] Other (See Comments)    "Destroyed my muscles"    Follow-up Information     Bacigalupo, Dionne Bucy, MD. Schedule an appointment as soon as possible for a visit in 1 week(s).   Specialty: Family Medicine Contact information: [address] [place] [address] [phone]         Kate Sable, MD .   Specialties: Cardiology, Radiology Contact information: [address] [place] [address] [phone]                  The results of significant diagnostics from this hospitalization (including imaging, microbiology, ancillary and laboratory) are listed below for reference.    Significant Diagnostic Studies: DG CHEST PORT 1 VIEW  Result Date: 11/11/2021 CLINICAL DATA:  Hypoxia. EXAM: PORTABLE CHEST 1 VIEW COMPARISON:  October 30, 2021. FINDINGS: Stable cardiomediastinal silhouette. Sternotomy wires are noted. Left lung is clear. Stable mild right basilar subsegmental atelectasis or scarring is noted. Small right pleural effusion may be present. Bony thorax is unremarkable. IMPRESSION: Stable mild right basilar subsegmental atelectasis or scarring is noted with small right pleural effusion. Electronically Signed   By:  Marijo Conception M.D.   On: 11/11/2021 15:47   CT Head Wo Contrast  Result Date: 11/10/2021 CLINICAL DATA:  Headache, hypertension, blurred vision. EXAM: CT HEAD WITHOUT CONTRAST TECHNIQUE: Contiguous axial images were obtained from the base of the skull through the vertex without intravenous contrast. RADIATION DOSE REDUCTION: This exam was performed according to the departmental dose-optimization program which includes automated exposure control, adjustment of the mA and/or kV according to patient size and/or use of iterative reconstruction technique. COMPARISON:  03/11/2021. FINDINGS: Brain: No acute intracranial hemorrhage, midline shift or mass effect. No extra-axial fluid collection. Subcortical and periventricular white matter  hypodensities are present bilaterally. No hydrocephalus. Diffuse atrophy is noted. 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 Vascular: Atherosclerotic calcification of the carotid siphons and vertebral arteries. No hyperdense vessel. Skull: Normal. Negative for fracture or focal lesion. Sinuses/Orbits: No acute finding. Other: None. IMPRESSION: 1. No acute intracranial process. 2. 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 Electronically Signed   By: Brett Fairy M.D.   On: 11/10/2021 03:49   NM Myocar Multi W/Spect W/Wall Motion / EF  Result Date: 10/30/2021 Pharmacological myocardial perfusion imaging study with no significant  ischemia Moderate-sized region fixed defect in the basal to mid inferior wall, mid anteroseptal region Basal to mid inferior wall hypokinesis, EF estimated at 39% Resting EKG with diffuse nonspecific ST and T wave abnormality, more pronounced with pharmacologic infusion with symptoms of chest discomfort No EKG changes concerning for ischemia at peak stress or in recovery. Moderate risk scan in the setting of fixed perfusion defect Signed, Esmond Plants, MD, Ph.D [facility]    DG Chest 2 View  Result Date: 10/30/2021 CLINICAL DATA:  Dyspnea.  Recent chest pain and shortness of breath. EXAM: CHEST - 2 VIEW COMPARISON:  10/30/2021 at 3:40 a.m. and 10/28/2021, 01/28/2021 FINDINGS: Patient is slightly rotated to the right. Sternotomy wires are unchanged. Lungs are adequately inflated and demonstrate mild stable right base opacification partially chronic but slightly worse which may be due to atelectasis or infection. Small amount right pleural fluid. Mild hazy prominence of the perihilar vessels unchanged likely mild vascular congestion. Cardiomediastinal silhouette and remainder of the exam is unchanged. Mild compression deformity over the thoracolumbar junction unchanged. IMPRESSION: 1. Chronic right base opacification slightly worse which may be due to atelectasis or infection. Small amount right pleural fluid. 2. Suggestion of mild vascular congestion. Electronically Signed   By: Marin Olp M.D.   On: 10/30/2021 14:10   DG Chest Port 1 View  Result Date: 10/30/2021 CLINICAL DATA:  Shortness of breath EXAM: PORTABLE CHEST 1 VIEW COMPARISON:  10/28/2021 FINDINGS: Cardiac shadow is enlarged in size. Postsurgical changes are again noted. Chronic blunting of the right costophrenic angle is noted. Mild central vascular congestion is noted similar to that seen on the prior exam. No new focal infiltrate is seen. IMPRESSION: Mild changes of CHF, stable from the prior study. Electronically Signed   By: Inez Catalina M.D.   On: 10/30/2021 03:49   ECHOCARDIOGRAM COMPLETE  Result Date: 10/29/2021    ECHOCARDIOGRAM REPORT   Patient Name:   DELFIN SQUILLACE Date of Exam: 10/29/2021 Medical Rec #:  [id number]        Height:       70.0 in Accession #:    [id number]       Weight:       276.3 lb Date of Birth:  [birth year]        BSA:          2.395 m Patient Age:    29 years         BP:           132/59 mmHg Patient Gender: M                HR:           55 bpm. Exam Location:  [facility] Procedure:  2D Echo, Color Doppler, Cardiac Doppler and Intracardiac            Opacification Agent Indications:  I50.31 congestive heart failure-Acute Diastolic  History:         Patient has prior history of Echocardiogram examinations, most                  recent 11/16/2020. CAD, Prior CABG, CKD and COPD; Risk                  Factors:Diabetes.  Sonographer:     Charmayne Sheer Referring Phys:  [place] Diagnosing Phys: Ida Rogue MD  Sonographer Comments: Technically challenging study due to limited acoustic windows. Image acquisition challenging due to patient body habitus. IMPRESSIONS  1. Left ventricular ejection fraction, by estimation, is 50 to 55%. The left ventricle has low normal function. Select images concerning for inferior wall hypokinesis. Left ventricular diastolic parameters are consistent with Grade I diastolic dysfunction (impaired relaxation).  2. Right ventricular systolic function is normal. The right ventricular size is normal.  3. Left atrial size was mildly dilated.  4. The mitral valve was not well visualized. Mild mitral valve regurgitation. No evidence of mitral stenosis.  5. The aortic valve was not well visualized. Aortic valve regurgitation is mild. No aortic stenosis is present.  6. The inferior vena cava is normal in size with greater than 50% respiratory variability, suggesting right atrial pressure of 3 mmHg. FINDINGS  Left Ventricle: Left ventricular ejection fraction, by estimation, is 50 to 55%. The left ventricle has low normal function. The left ventricle has no regional wall motion abnormalities. Definity contrast agent was given IV to delineate the left ventricular endocardial borders. The left ventricular internal cavity size was normal in size. There is no left ventricular hypertrophy. Left ventricular diastolic parameters are consistent with Grade I diastolic dysfunction (impaired relaxation). Right Ventricle: The right ventricular size is normal. No increase in right  ventricular wall thickness. Right ventricular systolic function is normal. Left Atrium: Left atrial size was mildly dilated. Right Atrium: Right atrial size was normal in size. Pericardium: There is no evidence of pericardial effusion. Mitral Valve: The mitral valve was not well visualized. Mild mitral valve regurgitation. No evidence of mitral valve stenosis. Tricuspid Valve: The tricuspid valve is not well visualized. Tricuspid valve regurgitation is not demonstrated. No evidence of tricuspid stenosis. Aortic Valve: The aortic valve was not well visualized. Aortic valve regurgitation is mild. No aortic stenosis is present. Aortic valve mean gradient measures 4.0 mmHg. Aortic valve peak gradient measures 6.2 mmHg. Aortic valve area, by VTI measures 2.60  cm. Pulmonic Valve: The pulmonic valve was not well visualized. Pulmonic valve regurgitation is not visualized. No evidence of pulmonic stenosis. Aorta: The aortic root is normal in size and structure. Venous: The inferior vena cava is normal in size with greater than 50% respiratory variability, suggesting right atrial pressure of 3 mmHg. IAS/Shunts: No atrial level shunt detected by color flow Doppler.  LEFT VENTRICLE PLAX 2D LVIDd:         5.85 cm   Diastology LVIDs:         4.53 cm   LV e' medial:    5.33 cm/s LV PW:         1.20 cm   LV E/e' medial:  17.6 LV IVS:        0.81 cm   LV e' lateral:   9.25 cm/s LVOT diam:     2.00 cm   LV E/e' lateral: 10.1 LV SV:         58 LV SV Index:   24  LVOT Area:     3.14 cm  LEFT ATRIUM           Index LA diam:      4.40 cm 1.84 cm/m LA Vol (A4C): 45.4 ml 18.96 ml/m  AORTIC VALVE AV Area (Vmax):    2.42 cm AV Area (Vmean):   2.14 cm AV Area (VTI):     2.60 cm AV Vmax:           124.00 cm/s AV Vmean:          92.500 cm/s AV VTI:            0.222 m AV Peak Grad:      6.2 mmHg AV Mean Grad:      4.0 mmHg LVOT Vmax:         95.40 cm/s LVOT Vmean:        62.900 cm/s LVOT VTI:          0.184 m LVOT/AV VTI ratio: 0.83   AORTA Ao Root diam: 3.20 cm MITRAL VALVE MV Area (PHT): 2.94 cm    SHUNTS MV Decel Time: 258 msec    Systemic VTI:  0.18 m MV E velocity: 93.60 cm/s  Systemic Diam: 2.00 cm MV A velocity: 60.60 cm/s MV E/A ratio:  1.54 Ida Rogue MD Electronically signed by Ida Rogue MD Signature Date/Time: 10/29/2021/2:24:14 PM    Final    DG Chest Port 1 View  Result Date: 10/28/2021 CLINICAL DATA:  Chest pain and shoulder pain, initial encounter EXAM: PORTABLE CHEST 1 VIEW COMPARISON:  01/28/2021 FINDINGS: Cardiac shadow is stable. Postsurgical changes are again seen. Vascular congestion is noted with very mild interstitial edema. The overall appearance is slightly worse than that seen on the prior study. Chronic blunting of the right costophrenic angle is noted. No bony abnormality is seen. IMPRESSION: Changes of mild CHF. Electronically Signed   By: Inez Catalina M.D.   On: 10/28/2021 20:02    Microbiology: Recent Results (from the past 240 hour(s))  MRSA Next Gen by PCR, Nasal     Status: None   Collection Time: 11/11/21  1:00 AM   Specimen: Nasal Mucosa; Nasal Swab  Result Value Ref Range Status   MRSA by PCR Next Gen NOT DETECTED NOT DETECTED Final    Comment: (NOTE) The GeneXpert MRSA Assay (FDA approved for NASAL specimens only), is one component of a comprehensive MRSA colonization surveillance program. It is not intended to diagnose MRSA infection nor to guide or monitor treatment for MRSA infections. Test performance is not FDA approved in patients less than 29 years old. Performed at [facility], [place] [address], [place], [place] [address]      Labs: Basic Metabolic Panel: Recent Labs  Lab 11/09/21 1601 11/10/21 0033 11/10/21 0700 11/11/21 0227 11/12/21 0055  NA 141 137  --  139 138  K 4.8 3.9  --  4.7 4.6  CL 102 105  --  100 105  CO2 21 22  --  27 26  GLUCOSE 93 116*  --  135* 126*  BUN 34* 33*  --  33* 30*  CREATININE 2.09* 1.98* 2.10* 2.29* 2.28*  CALCIUM 9.2  9.1  --  9.2 9.0  MG  --   --   --  2.0 2.0  PHOS 3.4  --   --   --   --    Liver Function Tests: Recent Labs  Lab 11/09/21 1601 11/11/21 0227  AST  --  45*  ALT  --  39  ALKPHOS  --  29*  BILITOT  --  0.8  PROT  --  6.1*  ALBUMIN 4.7 3.2*   No results for input(s): "LIPASE", "AMYLASE" in the last 168 hours. No results for input(s): "AMMONIA" in the last 168 hours. CBC: Recent Labs  Lab 11/10/21 0033 11/10/21 0700 11/11/21 0227 11/12/21 0055  WBC 7.0 6.0 6.4 6.0  HGB 14.8 13.9 14.2 13.7  HCT 47.2 43.6 43.6 42.3  MCV 93.5 93.6 92.0 92.8  PLT 278 264 256 246   Cardiac Enzymes: No results for input(s): "CKTOTAL", "CKMB", "CKMBINDEX", "TROPONINI" in the last 168 hours. BNP: BNP (last 3 results) Recent Labs    01/26/21 1442 10/28/21 1943 11/11/21 0301  BNP 372.9* 238.3* 226.2*    ProBNP (last 3 results) Recent Labs    09/17/21 1547  PROBNP 715*    CBG: Recent Labs  Lab 11/11/21 0605 11/11/21 1131 11/11/21 2116 11/12/21 0625 11/12/21 1051  GLUCAP 125* 147* 184* 130* 125*       Signed:  Irine Seal MD.  [facility] 11/12/2021, 2:30 PM

## 2021-11-12 NOTE — Progress Notes (Signed)
RN removed PIV and went over d/c summary w/ Pt and pt's wife. Belongings w/ pt, including TOC meds. NT transporting pt to private vehicle where pt's son will transport pt home

## 2021-11-17 ENCOUNTER — Telehealth: Payer: Self-pay

## 2021-11-17 NOTE — Telephone Encounter (Signed)
Transition Care Management Follow-up Telephone Call Date of discharge and from where: [place] 11-12-21 Dx: hypertensive urgency How have you been since you were released from the hospital? Feeling better Any questions or concerns? No  Items Reviewed: Did the pt receive and understand the discharge instructions provided? Yes  Medications obtained and verified? Yes  Other? No  Any new allergies since your discharge? No  Dietary orders reviewed? Yes Do you have support at home? Yes   Home Care and Equipment/Supplies: Were home health services ordered? no If so, what is the name of the agency? na  Has the agency set up a time to come to the patient's home? not applicable Were any new equipment or medical supplies ordered?  No What is the name of the medical supply agency? na Were you able to get the supplies/equipment? not applicable Do you have any questions related to the use of the equipment or supplies? No  Functional Questionnaire: (I = Independent and D = Dependent) ADLs: II  Bathing/Dressing- I  Meal Prep- I  Eating- I  Maintaining continence- I  Transferring/Ambulation- I  Managing Meds- I  Follow up appointments reviewed:  PCP Hospital f/u appt confirmed? Yes  Scheduled to see [name] NP on 11-25-21 @ 140pm. [facility] f/u appt confirmed? No- pt will call cardiology to schedule fu appt  Are transportation arrangements needed? No  If their condition worsens, is the pt aware to call PCP or go to the Emergency Dept.? Yes Was the patient provided with contact information for the PCP's office or ED? Yes Was to pt encouraged to call back with questions or concerns? Yes

## 2021-11-20 ENCOUNTER — Ambulatory Visit: Payer: Medicare Other | Admitting: Family

## 2021-11-25 ENCOUNTER — Ambulatory Visit (INDEPENDENT_AMBULATORY_CARE_PROVIDER_SITE_OTHER): Payer: Medicare Other | Admitting: Physician Assistant

## 2021-11-25 VITALS — BP 149/71 | HR 63 | Temp 98.7°F | Wt 276.0 lb

## 2021-11-25 DIAGNOSIS — G4733 Obstructive sleep apnea (adult) (pediatric): Secondary | ICD-10-CM

## 2021-11-25 DIAGNOSIS — N1831 Chronic kidney disease, stage 3a: Secondary | ICD-10-CM

## 2021-11-25 DIAGNOSIS — E1169 Type 2 diabetes mellitus with other specified complication: Secondary | ICD-10-CM

## 2021-11-25 DIAGNOSIS — Z23 Encounter for immunization: Secondary | ICD-10-CM

## 2021-11-25 DIAGNOSIS — E1159 Type 2 diabetes mellitus with other circulatory complications: Secondary | ICD-10-CM | POA: Diagnosis not present

## 2021-11-25 DIAGNOSIS — E1122 Type 2 diabetes mellitus with diabetic chronic kidney disease: Secondary | ICD-10-CM

## 2021-11-25 DIAGNOSIS — Z09 Encounter for follow-up examination after completed treatment for conditions other than malignant neoplasm: Secondary | ICD-10-CM

## 2021-11-25 DIAGNOSIS — E785 Hyperlipidemia, unspecified: Secondary | ICD-10-CM

## 2021-11-25 DIAGNOSIS — N1832 Chronic kidney disease, stage 3b: Secondary | ICD-10-CM

## 2021-11-25 DIAGNOSIS — I152 Hypertension secondary to endocrine disorders: Secondary | ICD-10-CM

## 2021-11-25 NOTE — Progress Notes (Signed)
[place],acting as a Education administrator for [employer], PA-C.,have documented all relevant documentation on the behalf of [name], PA-C,as directed by  [employer], PA-C while in the presence of [employer], PA-C.    Established patient visit   Patient: Tony Williams   DOB: 09/11/1945   76 y.o. Male  MRN: [id number] Visit Date: 11/25/2021  Today's healthcare provider: [name], PA-C   CC: Hospital FU  Subjective    HPI  Follow up Hospitalization  Patient was admitted to [facility] on 11/09/21 and discharged on 11/12/21. He was treated for hypertension urgency in patient with DM and CAD-s/p CABG, CKD stage 3A, OSA on CPAP and Gout.   Treatment for this included starting Amlodipine 10 mg daily and Hydralazine 50 mg three times daily.  His Prasugrel was discontinued.  He was instructed to start Clopidogrel on 11/13/21. Telephone follow up was done on 11/17/21 He reports good compliance with treatment. Patient has yet to make appointment with cardiology as requested by our office and the hospital.  He agreed to have [place] put in a referral and have our team make the appointment for him.    Patient states he has been checking his blood pressure and this morning his reading ws 155/62.  He has had 1 episode of chest pain since being out of the hospital. He described it as having 'twinges'.  He took a NTG and said he hasn't had any more. ----------------------------------------------------------------------------------------- -   Medications: Outpatient Medications Prior to Visit  Medication Sig   acetaminophen (TYLENOL) 325 MG tablet Take 2 tablets (650 mg total) by mouth every 6 (six) hours as needed for mild pain (or Fever >/= 101).   albuterol (VENTOLIN HFA) 108 (90 Base) MCG/ACT inhaler Inhale 2 puffs into the lungs every 6 (six) hours as needed for wheezing or shortness of breath.   allopurinol (ZYLOPRIM) 100 MG tablet Take 1 tablet (100 mg total) by mouth daily.    amLODipine (NORVASC) 10 MG tablet Take 1 tablet (10 mg total) by mouth daily.   aspirin EC 81 MG tablet Take 81 mg by mouth daily. Swallow whole.   Budeson-Glycopyrrol-Formoterol (BREZTRI AEROSPHERE) 160-9-4.8 MCG/ACT AERO Inhale 2 puffs into the lungs 2 (two) times daily.   Cholecalciferol (VITAMIN D3 PO) Take 4,000 Units by mouth.   clopidogrel (PLAVIX) 75 MG tablet Take 1 tablet (75 mg total) by mouth daily.   Coenzyme Q10 (CO Q-10) 400 MG CAPS Take 400 mg by mouth daily at 6 (six) AM.   desoximetasone (TOPICORT) 0.25 % cream Apply 1 application. topically 2 (two) times daily.   escitalopram (LEXAPRO) 20 MG tablet TAKE 1 TABLET(20 MG) BY MOUTH DAILY (Patient taking differently: Take 20 mg by mouth daily.)   fenofibrate (TRICOR) 145 MG tablet Take 1 tablet (145 mg total) by mouth daily.   gabapentin (NEURONTIN) 300 MG capsule Take 1 capsule (300 mg total) by mouth 2 (two) times daily AND 2 capsules (600 mg total) at bedtime.   glimepiride (AMARYL) 1 MG tablet TAKE 1 TABLET(1 MG) BY MOUTH DAILY WITH BREAKFAST (Patient taking differently: Take 1 mg by mouth daily with breakfast.)   hydrALAZINE (APRESOLINE) 50 MG tablet Take 1 tablet (50 mg total) by mouth every 8 (eight) hours.   HYDROcodone-acetaminophen (NORCO/VICODIN) 5-325 MG tablet Take 1 tablet by mouth every 6 (six) hours as needed for moderate pain.   loratadine (CLARITIN) 10 MG tablet Take 1 tablet (10 mg total) by mouth daily.   lovastatin (MEVACOR)  40 MG tablet TAKE 1 TABLET(40 MG) BY MOUTH DAILY (Patient taking differently: Take 40 mg by mouth daily.)   nitroGLYCERIN (NITROSTAT) 0.4 MG SL tablet Place 1 tablet (0.4 mg total) under the tongue every 5 (five) minutes as needed for chest pain.   Omega-3 Fatty Acids (FISH OIL) 1200 MG CAPS Take 1,200 mg by mouth daily at 6 (six) AM.   torsemide (DEMADEX) 20 MG tablet Take 1 tablet (20 mg total) by mouth daily as needed. (Patient taking differently: Take 20 mg by mouth daily as needed (For  fluid).)   traZODone (DESYREL) 150 MG tablet Take 1 tablet (150 mg total) by mouth at bedtime.   No facility-administered medications prior to visit.    Review of Systems  Cardiovascular:  Negative for chest pain, palpitations and leg swelling.  Neurological:  Negative for dizziness and headaches.       Objective    BP (!) 149/71 (BP Location: Left Arm, Patient Position: Sitting, Cuff Size: Large)   Pulse 63   Temp 98.7 F (37.1 C) (Oral)   Wt 276 lb (125.2 kg)   SpO2 94%   BMI 39.60 kg/m    Physical Exam Constitutional:      General: He is not in acute distress.    Appearance: Normal appearance. He is obese. He is not diaphoretic.  HENT:     Head: Normocephalic.     Nose: Nose normal.  Eyes:     General: No scleral icterus.       Right eye: No discharge.        Left eye: No discharge.     Extraocular Movements: Extraocular movements intact.     Conjunctiva/sclera: Conjunctivae normal.  Cardiovascular:     Rate and Rhythm: Normal rate.  Pulmonary:     Effort: Pulmonary effort is normal. No respiratory distress.  Musculoskeletal:        General: Normal range of motion.     Cervical back: Normal range of motion and neck supple.  Skin:    General: Skin is warm.  Neurological:     Mental Status: He is alert and oriented to person, place, and time. Mental status is at baseline.     Cranial Nerves: No cranial nerve deficit.     Sensory: No sensory deficit.     Motor: No weakness.     Coordination: Coordination normal.     Gait: Gait normal.     Deep Tendon Reflexes: Reflexes normal.  Psychiatric:        Behavior: Behavior normal.        Thought Content: Thought content normal.        Judgment: Judgment normal.       No results found for any visits on 11/25/21.  Assessment & Plan     1. Stage 3b chronic kidney disease (CKD) ([place])  - Basic metabolic panel - Ambulatory referral to Cardiology  2. Hospital discharge follow-up  - Basic metabolic panel -  Ambulatory referral to Cardiology  3. Type 2 diabetes mellitus with stage 3a chronic kidney disease, without long-term current use of insulin (HCC)  - Basic metabolic panel - Ambulatory referral to Cardiology  4. Hypertension associated with diabetes ([place]) BP 149/71 today , it is improving Pt was requested to continue BP log - Basic metabolic panel - Ambulatory referral to Cardiology  5. Hyperlipidemia associated with type 2 diabetes mellitus ([place]) Continue current medication Advised to adhere to low fat diet and daily exercise Weight loss of 5% encouraged  6. Need for influenza vaccination Postponed to the next month, next visit  7. OSA  On CPAP Weight loss is strongly encouraged  FU in a mo     The patient was advised to call back or seek an in-person evaluation if the symptoms worsen or if the condition fails to improve as anticipated.  I discussed the assessment and treatment plan with the patient. The patient was provided an opportunity to ask questions and all were answered. The patient agreed with the plan and demonstrated an understanding of the instructions.  The entirety of the information documented in the History of Present Illness, Review of Systems and Physical Exam were personally obtained by me. Portions of this information were initially documented by the CMA and reviewed by me for thoroughness and accuracy.   Portions of this note were created using dictation software and may contain typographical errors.     [name], PA-C  [facility] [phone] (phone) [phone] (fax)  [place]

## 2021-11-26 ENCOUNTER — Telehealth: Payer: Self-pay | Admitting: Physician Assistant

## 2021-11-26 LAB — BASIC METABOLIC PANEL
BUN/Creatinine Ratio: 20 (ref 10–24)
BUN: 43 mg/dL — ABNORMAL HIGH (ref 8–27)
CO2: 23 mmol/L (ref 20–29)
Calcium: 9.7 mg/dL (ref 8.6–10.2)
Chloride: 103 mmol/L (ref 96–106)
Creatinine, Ser: 2.12 mg/dL — ABNORMAL HIGH (ref 0.76–1.27)
Glucose: 129 mg/dL — ABNORMAL HIGH (ref 70–99)
Potassium: 5 mmol/L (ref 3.5–5.2)
Sodium: 141 mmol/L (ref 134–144)
eGFR: 32 mL/min/{1.73_m2} — ABNORMAL LOW (ref >59)

## 2021-11-26 NOTE — Telephone Encounter (Signed)
Could you contact  pt and check how he is doing, what was his BP readings and was he able to contact and schedule appt with Nephrology. We are working to schedule him with Cardiology but it will be helpful if he will contact his Nephrology as an established pt and scheduled his appt.

## 2021-11-26 NOTE — Addendum Note (Signed)
Addended by: Althea Charon D on: 11/26/2021 01:08 PM   Modules accepted: Orders

## 2021-11-27 ENCOUNTER — Ambulatory Visit: Payer: Medicare Other | Admitting: Physician Assistant

## 2021-11-29 NOTE — Progress Notes (Unsigned)
Cardiology Office Note:    Date:  11/30/2021   ID:  Tony Williams, DOB 1945/07/30, MRN [id number]  PCP:  Virginia Crews, MD  [facility] Cardiologist:  Kate Sable, MD  [place] Electrophysiologist:  None   Referring MD: [name], PA-C   Chief Complaint: Hospital follow-up  History of Present Illness:    Tony Williams is a 76 y.o. male with a hx of CAD s/p 4V CABG in 2007 with subsequent intervention, HTN, HLD, statin intolerant, obesity, diabetes, remote tobacco abuse, COPD, sleep apnea not using CPAP, CKD stage 3 who is being seen for hospital follow-up.   He underwent CABG in [place]. In 2018, he siffered a NSTEMI and required stenting of the vein graft to the obtuse marginal. He subsequently moved to [place] and established care with the practice. In March 2022 he was evaluated for chest discomfort and underwent stress testing which showed basal inferior, basal inferolateral, mid inferior, and mid inferolateral defect consistent with infarct and moderate peri-infarct ischemia. EF was 45%. This was followed by an echocardiogram which showed an EF 60-65% without WMA. He was medically managed. In August 2022, he was admitted with angina and ruled in for NSTEMI. Echo showed EF 50-55% with apical HK and G2DD. He was volume overloaded and required diuresis with subsequent development of mid AKI. He underwent diagnostic cath revealing an occlusion of the previously stented vein graft to the obtuse marginal as well as severe disease in the quintal vein graft to the D1 and mid LAD with stenosis prior to insertion of both vessels. He required placement of intra-aortic balloon pump was transferred to [facility] for high risk PCI, which was performed the following day with 2 stents placed in the sequential vein graft. During admission, he was noted to have intermittent bradycardia in 2-1 AV block. BB was discontinued.  The patient was admitted for NSTEMI and  hypertensive urgency, HS troponin up to 71. Chest pain with typical and atypical features. Stress test showed fixed defect and no significant ischemia. Not a good cath candidate due to renal failure. He was discharged on Aspirin, Effient, and pravastatin.   Today, the patient reports he has been dogni well at home. BP at home has been 140s. Today blood pressure 120/72. He is taking amlodipine and hydralazine. He denies further chest pain. He has chronic SOB from COPD. No lower leg edema, orthopnea, or pnd. He uses a CPAP at night. He is taking torsemide most days. He is on DAPT indefinitely. He needs a referral to nephrology. He could not afford Repatha in the past.  Past Medical History:  Diagnosis Date   Bladder cancer ([place]) 2013   CAD (coronary artery disease)    a. 2007 CABG x 4: LIMA->D1, VG->OM1, VG->D2->mLAD; b. 2018 NSTEMI/PCI: DES to VG->OM; c. 10/2020 NSTEMI/PCI: LM 90d, LAD 154m D1 100, LCX 100ost/p, RCA sev diff dzs, VG->D2->mLAD 99/90 before D2 (4.0x15 Onyx Frontier DES), 90 before mLAD (4.0x15 Onyx Frontier DES), LIMA->D1 ok, VG->OM1 100.   Chronic heart failure with preserved ejection fraction (HFpEF) ([place]    a. 10/2020 Echo: EF 50-55%, apical HK, GrII DD, mod dil LA; b. 11/2020 Echo: EF 60-65%, no rwma.   Chronic kidney disease    COPD (chronic obstructive pulmonary disease) (HCC)    Depression    Diabetes mellitus without complication (HCC)    Hx of CABG x 4    LIMA-D2, SVG-OM (occluded), SeqSVG-D1-LAD.   Labile hypertension    Myocardial infarction ([place]  Past Surgical History:  Procedure Laterality Date   CHOLECYSTECTOMY, LAPAROSCOPIC  12/26/1995   COLONOSCOPY WITH PROPOFOL N/A 09/23/2020   Procedure: COLONOSCOPY WITH PROPOFOL;  Surgeon: Lucilla Lame, MD;  Location: [facility] ENDOSCOPY;  Service: Endoscopy;  Laterality: N/A;   CORONARY ARTERY BYPASS GRAFT     LIMA-D2, SVG-OM, SeqSVG-D1-LAD   CORONARY STENT INTERVENTION N/A 11/12/2020   Procedure: CORONARY STENT  INTERVENTION;  Surgeon: Wellington Hampshire, MD;  Location: [facility];  Service: Cardiovascular;  Laterality: N/A;   EYE SURGERY     IABP INSERTION N/A 11/11/2020   Procedure: IABP Insertion;  Surgeon: Nelva Bush, MD;  Location: [facility];  Service: Cardiovascular;  Laterality: N/A;   IR STENT PLACEMENT ANTE CAROTID INC ANGIO     KNEE SURGERY     RIGHT/LEFT HEART CATH AND CORONARY/GRAFT ANGIOGRAPHY N/A 11/11/2020   Procedure: RIGHT/LEFT HEART CATH AND CORONARY/GRAFT ANGIOGRAPHY;  Surgeon: Nelva Bush, MD;  Location: [facility];  Service: Cardiovascular;  Severe native CAD: 90% dLMCA-100% mLAD/100% ost LCx CTO w/ Severe diffuse RCA disease. Widely patent LIMA-D1. Patent sequential SVG-D2-LAD with 99% mid graft & 90% LIMA-D2 @ anastomosis. previously stented SVG-OM - CTO. Mildly   TRANSURETHRAL RESECTION OF PROSTATE  1994   URINARY SPHINCTER IMPLANT  1995    Current Medications: Current Meds  Medication Sig   acetaminophen (TYLENOL) 325 MG tablet Take 2 tablets (650 mg total) by mouth every 6 (six) hours as needed for mild pain (or Fever >/= 101).   albuterol (VENTOLIN HFA) 108 (90 Base) MCG/ACT inhaler Inhale 2 puffs into the lungs every 6 (six) hours as needed for wheezing or shortness of breath.   allopurinol (ZYLOPRIM) 100 MG tablet Take 1 tablet (100 mg total) by mouth daily.   amLODipine (NORVASC) 10 MG tablet Take 1 tablet (10 mg total) by mouth daily.   aspirin EC 81 MG tablet Take 81 mg by mouth daily. Swallow whole.   Budeson-Glycopyrrol-Formoterol (BREZTRI AEROSPHERE) 160-9-4.8 MCG/ACT AERO Inhale 2 puffs into the lungs 2 (two) times daily.   Cholecalciferol (VITAMIN D3 PO) Take 4,000 Units by mouth.   clopidogrel (PLAVIX) 75 MG tablet Take 1 tablet (75 mg total) by mouth daily.   Coenzyme Q10 (CO Q-10) 400 MG CAPS Take 400 mg by mouth daily at 6 (six) AM.   desoximetasone (TOPICORT) 0.25 % cream Apply 1 application. topically 2 (two) times  daily.   escitalopram (LEXAPRO) 20 MG tablet TAKE 1 TABLET(20 MG) BY MOUTH DAILY   fenofibrate (TRICOR) 145 MG tablet Take 1 tablet (145 mg total) by mouth daily.   gabapentin (NEURONTIN) 300 MG capsule Take 1 capsule (300 mg total) by mouth 2 (two) times daily AND 2 capsules (600 mg total) at bedtime.   glimepiride (AMARYL) 1 MG tablet TAKE 1 TABLET(1 MG) BY MOUTH DAILY WITH BREAKFAST   hydrALAZINE (APRESOLINE) 50 MG tablet Take 1 tablet (50 mg total) by mouth every 8 (eight) hours.   HYDROcodone-acetaminophen (NORCO/VICODIN) 5-325 MG tablet Take 1 tablet by mouth every 6 (six) hours as needed for moderate pain.   loratadine (CLARITIN) 10 MG tablet Take 1 tablet (10 mg total) by mouth daily.   lovastatin (MEVACOR) 40 MG tablet TAKE 1 TABLET(40 MG) BY MOUTH DAILY   nitroGLYCERIN (NITROSTAT) 0.4 MG SL tablet Place 1 tablet (0.4 mg total) under the tongue every 5 (five) minutes as needed for chest pain.   Omega-3 Fatty Acids (FISH OIL) 1200 MG CAPS Take 1,200 mg by mouth daily at  6 (six) AM.   torsemide (DEMADEX) 20 MG tablet Take 1 tablet (20 mg total) by mouth daily as needed.   traZODone (DESYREL) 150 MG tablet Take 1 tablet (150 mg total) by mouth at bedtime.     Allergies:   Butorphanol, Morphine, Pedi-pre tape spray [wound dressing adhesive], Statins, and Zocor [simvastatin]   Social History   Socioeconomic History   Marital status: Married    Spouse name: Not on file   Number of children: 2   Years of education: Not on file   Highest education level: High school graduate  Occupational History   Occupation: retired  Tobacco Use   Smoking status: Former    Packs/day: 1.00    Years: 30.00    Total pack years: 30.00    Types: Cigarettes    Quit date: 10/23/2003    Years since quitting: 18.1   Smokeless tobacco: Never  Vaping Use   Vaping Use: Never used  Substance and Sexual Activity   Alcohol use: Never   Drug use: Never   Sexual activity: Not on file  Other Topics Concern    Not on file  Social History Narrative   Not on file   Social Determinants of Health   Financial Resource Strain: Low Risk  (05/19/2021)   Overall Financial Resource Strain (CARDIA)    Difficulty of Paying Living Expenses: Not hard at all  Food Insecurity: No Food Insecurity (05/19/2021)   Hunger Vital Sign    Worried About Running Out of Food in the Last Year: Never true    [place] in the Last Year: Never true  Transportation Needs: No Transportation Needs (05/19/2021)   PRAPARE - Hydrologist (Medical): No    Lack of Transportation (Non-Medical): No  Physical Activity: Inactive (05/19/2021)   Exercise Vital Sign    Days of Exercise per Week: 0 days    Minutes of Exercise per Session: 0 min  Stress: No Stress Concern Present (05/19/2021)   [place]    Feeling of Stress : Only a little  Recent Concern: Stress - Stress Concern Present (02/26/2021)   [place]    Feeling of Stress : To some extent  Social Connections: Moderately Integrated (05/19/2021)   Social Connection and Isolation Panel [NHANES]    Frequency of Communication with Friends and Family: More than three times a week    Frequency of Social Gatherings with Friends and Family: Twice a week    Attends Religious Services: More than 4 times per year    Active Member of [employer] or Organizations: No    Attends Archivist Meetings: Never    Marital Status: Married     Family History: The patient's family history includes Heart Problems in his father and mother.  ROS:   Please see the history of present illness.     All other systems reviewed and are negative.  EKGs/Labs/Other Studies Reviewed:    The following studies were reviewed today:  Echo 10/2021  1. Left ventricular ejection fraction, by estimation, is 50 to 55%. The  left ventricle has  low normal function. Select images concerning for  inferior wall hypokinesis. Left ventricular diastolic parameters are  consistent with Grade I diastolic  dysfunction (impaired relaxation).   2. Right ventricular systolic function is normal. The right ventricular  size is normal.   3. Left atrial size was mildly  dilated.   4. The mitral valve was not well visualized. Mild mitral valve  regurgitation. No evidence of mitral stenosis.   5. The aortic valve was not well visualized. Aortic valve regurgitation  is mild. No aortic stenosis is present.   6. The inferior vena cava is normal in size with greater than 50%  respiratory variability, suggesting right atrial pressure of 3 mmHg.   Myoview Lexiscan 10/2021 Narrative & Impression  Pharmacological myocardial perfusion imaging study with no significant  ischemia Moderate-sized region fixed defect in the basal to mid inferior wall, mid anteroseptal region Basal to mid inferior wall hypokinesis, EF estimated at 39% Resting EKG with diffuse nonspecific ST and T wave abnormality, more pronounced with pharmacologic infusion with symptoms of chest discomfort No EKG changes concerning for ischemia at peak stress or in recovery. Moderate risk scan in the setting of fixed perfusion defect     Signed, Esmond Plants, MD, Ph.D [facility]      R/L heart cath 10/2020   Conclusions: Severe native coronary artery disease including 90% distal LMCA stenosis, 100% mid LAD occlusion, 100% ostial LCx occlusion, and severe diffuse RCA disease. Widely patent LIMA-D1. Patent sequential SVG-D2-LAD with 99% mid graft stenosis as well as 90% stenosis at the LIMA-D2 anastomosis. Chronically occluded, previously stented SVG-OM. Mildly elevated left and right heart filling pressures. Moderate pulmonary hypertension. Normal-supranormal Fick cardiac output/index. Successful placement of 50 mL intra-aortic balloon pump via the right common femoral artery.    Recommendations: Images reviewed with Dr. Fletcher Anon.  We agree that patient is high risk for revascularization given his severe native and graft disease.  His distal targets are small and diffusely diseased, making them suboptimal for redo-CABG.  We will transfer to [facility] for possible high risk PCI to SVG-D2-LAD tomorrow. Titrate IV nitroglycerin for relief of chest pain. Aggressive secondary prevention.   Nelva Bush, MD  Coronary stent intervention 10/2020   Dist LM lesion is 90% stenosed.   Mid LAD lesion is 100% stenosed.   Ost Cx to Prox Cx lesion is 100% stenosed.   Dist Graft lesion before 2nd Diag  is 99% stenosed.   Prox Graft lesion between 2nd Diag and Mid LAD  is 90% stenosed.   1st Diag lesion is 100% stenosed.   Insertion lesion before 2nd Diag  is 90% stenosed.   A drug-eluting stent was successfully placed using a STENT ONYX FRONTIER 4.0X15.   A drug-eluting stent was successfully placed using a STENT ONYX FRONTIER 4.0X15.   Post intervention, there is a 0% residual stenosis.   Post intervention, there is a 0% residual stenosis.   SVG and is large.   Successful drug-eluting stent placement to the mid SVG to LAD/diagonal using distal protection device. Successful drug-eluting stent placement to the distal SVG to LAD/diagonal without distal protection device due to distal location.   Recommendations: Dual antiplatelet therapy indefinitely. Aggressive treatment of risk factors. Recommend removing intra-aortic balloon pump at 8:30 PM to allow washout of heparin.    [facility]  EKG:  EKG is  ordered today.  The ekg ordered today demonstrates NSR, 69bpm, iLBBB, diffuse TWI, no changes  Recent Labs: 12/08/2020: TSH 2.515 09/17/2021: NT-Pro BNP 715 11/11/2021: ALT 39; B Natriuretic Peptide 226.2 11/12/2021: Hemoglobin 13.7; Magnesium 2.0; Platelets 246 11/25/2021: BUN 43; Creatinine, Ser 2.12; Potassium 5.0; Sodium 141  Recent Lipid Panel    Component Value  Date/Time   CHOL 200 07/22/2021 0940   CHOL 156 07/21/2020 1440   TRIG  384 (H) 07/22/2021 0940   HDL 33 (L) 07/22/2021 0940   HDL 34 (L) 07/21/2020 1440   CHOLHDL 6.1 07/22/2021 0940   VLDL 77 (H) 07/22/2021 0940   LDLCALC 90 07/22/2021 0940   LDLCALC 84 07/21/2020 1440     Risk Assessment/Calculations:       Physical Exam:    VS:  BP 120/72 (BP Location: Left Arm, Patient Position: Sitting, Cuff Size: Large)   Pulse 69   Ht '5\' 10"'$  (1.778 m)   Wt 280 lb (127 kg)   SpO2 96%   BMI 40.18 kg/m     Wt Readings from Last 3 Encounters:  11/30/21 280 lb (127 kg)  11/25/21 276 lb (125.2 kg)  11/10/21 272 lb 4.3 oz (123.5 kg)     GEN:  Well nourished, well developed in no acute distress HEENT: Normal NECK: No JVD; No carotid bruits LYMPHATICS: No lymphadenopathy CARDIAC: RRR, no murmurs, rubs, gallops RESPIRATORY:  Clear to auscultation without rales, wheezing or rhonchi  ABDOMEN: Soft, non-tender, non-distended MUSCULOSKELETAL:  No edema; No deformity  SKIN: Warm and dry NEUROLOGIC:  Alert and oriented x 3 PSYCHIATRIC:  Normal affect   ASSESSMENT:    1. Coronary artery disease involving coronary bypass graft of native heart without angina pectoris   2. Essential hypertension   3. Chronic diastolic heart failure ([place])   4. Chronic kidney disease, unspecified CKD stage   5. Hyperlipidemia, mixed    PLAN:    In order of problems listed above:  CAD with stable angina Patient recently admitted with NSTEMI, not taken for cath due to CKD and atypical symptoms. Myoview showed no significant ischemia, fixed defect, basal to mid inferior HK, moderate risk scan. Echo showed LVEF 50-55%, G1DD, mild MR, mild AI. He denies further chest pain. No further work-up indicated. Continue indefinite DAPT with Aspirin and Plavix. Continue Lovastatin and fenofibrate. No BB 2/2 bradycardia.   HTN BP severely elevated in the hospital. Today, it is better 120/72. Continue Amlodipine '10mg'$   daily and Hydralazine '50mg'$  TID. He will continue to check his BP at home.   Chronic diastolic CHF Patient is euvolemic on exam today. Recent echo showed LVEF 50-55% with G1DD. He takes Torsemide '20mg'$  most days. Can consider addition of SGLT2i at follow-up.   CKD stage 3 Scr baseline 2-2.2. I will refer to nephrology.   HLD LDL 90. He could not afford Repatha. Continue Lovastatin and Tricor.   Disposition: Follow up in 4 month(s) with MD/APP   Signed, Akemi Overholser Ninfa Meeker, PA-C  11/30/2021 8:26 AM    [facility]

## 2021-11-30 ENCOUNTER — Ambulatory Visit: Payer: Medicare Other | Attending: Medical | Admitting: Medical

## 2021-11-30 ENCOUNTER — Encounter: Payer: Self-pay | Admitting: Medical

## 2021-11-30 VITALS — BP 120/72 | HR 69 | Ht 70.0 in | Wt 280.0 lb

## 2021-11-30 DIAGNOSIS — I10 Essential (primary) hypertension: Secondary | ICD-10-CM

## 2021-11-30 DIAGNOSIS — I25810 Atherosclerosis of coronary artery bypass graft(s) without angina pectoris: Secondary | ICD-10-CM | POA: Diagnosis not present

## 2021-11-30 DIAGNOSIS — I5032 Chronic diastolic (congestive) heart failure: Secondary | ICD-10-CM | POA: Diagnosis not present

## 2021-11-30 DIAGNOSIS — N189 Chronic kidney disease, unspecified: Secondary | ICD-10-CM

## 2021-11-30 DIAGNOSIS — E782 Mixed hyperlipidemia: Secondary | ICD-10-CM

## 2021-11-30 NOTE — Patient Instructions (Signed)
Medication Instructions:  Your physician recommends that you continue on your current medications as directed. Please refer to the Current Medication list given to you today.  *If you need a refill on your cardiac medications before your next appointment, please call your pharmacy*   Lab Work: None ordered If you have labs (blood work) drawn today and your tests are completely normal, you will receive your results only by: [place] (if you have MyChart) OR A paper copy in the mail If you have any lab test that is abnormal or we need to change your treatment, we will call you to review the results.   Testing/Procedures: None ordered   Follow-Up: At [facility], you and your health needs are our priority.  As part of our continuing mission to provide you with exceptional heart care, we have created designated Provider Care Teams.  These Care Teams include your primary Cardiologist (physician) and Advanced Practice Providers (APPs -  Physician Assistants and Nurse Practitioners) who all work together to provide you with the care you need, when you need it.  We recommend signing up for the patient portal called "MyChart".  Sign up information is provided on this After Visit Summary.  MyChart is used to connect with patients for Virtual Visits (Telemedicine).  Patients are able to view lab/test results, encounter notes, upcoming appointments, etc.  Non-urgent messages can be sent to your provider as well.   To learn more about what you can do with MyChart, go to [email or web address].    Your next appointment:   Follow up as planned   The format for your next appointment:   In Person  Provider:   Kate Sable, MD    Other Instructions You have been referred to [facility]. Their office will call you to schedule.   Important Information About [place]

## 2021-12-03 ENCOUNTER — Other Ambulatory Visit: Payer: Self-pay | Admitting: Family Medicine

## 2021-12-03 NOTE — Telephone Encounter (Signed)
Medication Refill - Medication: HYDROcodone-acetaminophen (NORCO/VICODIN) 5-325 MG tablet   Has the patient contacted their pharmacy? Yes.   (Agent: If no, request that the patient contact the pharmacy for the refill. If patient does not wish to contact the pharmacy document the reason why and proceed with request.) (Agent: If yes, when and what did the pharmacy advise?)  Preferred Pharmacy (with phone number or street name):  [id number] - [place], [place] - [place] AT [place]  [address] [place] [address]  Phone: [phone] Fax: [phone]   Has the patient been seen for an appointment in the last year OR does the patient have an upcoming appointment? Yes.    Agent: Please be advised that RX refills may take up to 3 business days. We ask that you follow-up with your pharmacy.

## 2021-12-03 NOTE — Telephone Encounter (Signed)
Requested medication (s) are due for refill today: yes  Requested medication (s) are on the active medication list: yes  Last refill:  11/03/21 #30/0  Future visit scheduled: yes  Notes to clinic:  Unable to refill per protocol, cannot delegate.      Requested Prescriptions  Pending Prescriptions Disp Refills   HYDROcodone-acetaminophen (NORCO/VICODIN) 5-325 MG tablet 30 tablet 0    Sig: Take 1 tablet by mouth every 6 (six) hours as needed for moderate pain.     Not Delegated - Analgesics:  Opioid Agonist Combinations Failed - 12/03/2021  3:56 PM      Failed - This refill cannot be delegated      Failed - Urine Drug Screen completed in last 360 days      Passed - Valid encounter within last 3 months    Recent Outpatient Visits           1 week ago Stage 3b chronic kidney disease (CKD) ([place])   [place], Ann[place], PA-C   3 weeks ago Hospital discharge follow-up   [place], [place], PA-C   1 month ago Controlled type 2 diabetes mellitus with complication, without long-term current use of insulin ([place])   [facility] [place], [place], PA-C   2 months ago Controlled type 2 diabetes mellitus with complication, without long-term current use of insulin ([name])   [facility] [place], [place], PA-C   2 months ago Chronic pain of both knees   [employer], [place], PA-C       Future Appointments             In 2 weeks [name], PA-C [facility], [place]   In 2 months Bacigalupo, Dionne Bucy, MD [employer], [place]   In 4 months Agbor-Etang, Aaron Edelman, MD [place]. [place]

## 2021-12-07 ENCOUNTER — Other Ambulatory Visit: Payer: Self-pay | Admitting: Family Medicine

## 2021-12-07 MED ORDER — HYDROCODONE-ACETAMINOPHEN 5-325 MG PO TABS: 1.0000 | ORAL_TABLET | Freq: Four times a day (QID) | ORAL | 0 refills | Status: DC | PRN

## 2021-12-07 NOTE — Telephone Encounter (Signed)
Medication Refill - Medication:  amLODipine (NORVASC) 10 MG tablet  hydrALAZINE (APRESOLINE) 50 MG tablet  Has the patient contacted their pharmacy? No.  Preferred Pharmacy (with phone number or street name):  [id number] - [place], [place] - [place] Phone:  [phone]  Fax:  [phone]     Has the patient been seen for an appointment in the last year OR does the patient have an upcoming appointment? Yes.    Agent: Please be advised that RX refills may take up to 3 business days. We ask that you follow-up with your pharmacy.

## 2021-12-07 NOTE — Telephone Encounter (Signed)
Pt checking status of refill request  Please assist further

## 2021-12-08 NOTE — Telephone Encounter (Signed)
Requested medication (s) are due for refill today: no  Requested medication (s) are on the active medication list: yes  Last refill:  11/12/21 for both meds  Future visit scheduled: yes  Notes to clinic:  meds ordered at hospital discharge   Requested Prescriptions  Pending Prescriptions Disp Refills   amLODipine (NORVASC) 10 MG tablet 30 tablet 1    Sig: Take 1 tablet (10 mg total) by mouth daily.     Cardiovascular: Calcium Channel Blockers 2 Passed - 12/07/2021  3:33 PM      Passed - Last BP in normal range    BP Readings from Last 1 Encounters:  11/30/21 120/72         Passed - Last Heart Rate in normal range    Pulse Readings from Last 1 Encounters:  11/30/21 69         Passed - Valid encounter within last 6 months    Recent Outpatient Visits           1 week ago Stage 3b chronic kidney disease (CKD) ([place])   [place], [place], PA-C   4 weeks ago Hospital discharge follow-up   [place], [place], PA-C   1 month ago Controlled type 2 diabetes mellitus with complication, without long-term current use of insulin ([place])   [facility] [place], [place], PA-C   2 months ago Controlled type 2 diabetes mellitus with complication, without long-term current use of insulin ([place])   [facility] [place], [place], PA-C   2 months ago Chronic pain of both knees   [employer], [place], PA-C       Future Appointments             In 2 weeks [name], PA-C [employer], [place]   In 2 months Bacigalupo, Dionne Bucy, MD [employer], [place]   In 4 months Agbor-Etang, Aaron Edelman, MD [place]. [facility]             hydrALAZINE (APRESOLINE) 50 MG tablet 90 tablet 1    Sig: Take 1 tablet (50 mg total) by mouth every 8 (eight) hours.     Cardiovascular:  Vasodilators Failed - 12/07/2021  3:33 PM      Failed - ANA Screen, Ifa,  Serum in normal range and within 360 days    No results found for: "ANA", "ANATITER", "LABANTI"       Passed - HCT in normal range and within 360 days    HCT  Date Value Ref Range Status  11/12/2021 42.3 39.0 - 52.0 % Final   Hematocrit  Date Value Ref Range Status  04/22/2020 46.9 37.5 - 51.0 % Final         Passed - HGB in normal range and within 360 days    Hemoglobin  Date Value Ref Range Status  11/12/2021 13.7 13.0 - 17.0 g/dL Final  04/22/2020 15.6 13.0 - 17.7 g/dL Final         Passed - RBC in normal range and within 360 days    RBC  Date Value Ref Range Status  11/12/2021 4.56 4.22 - 5.81 MIL/uL Final         Passed - WBC in normal range and within 360 days    WBC  Date Value Ref Range Status  11/12/2021 6.0 4.0 - 10.5 K/uL Final         Passed - PLT in  normal range and within 360 days    Platelets  Date Value Ref Range Status  11/12/2021 246 150 - 400 K/uL Final  04/22/2020 221 150 - 450 x10E3/uL Final         Passed - Last BP in normal range    BP Readings from Last 1 Encounters:  11/30/21 120/72         Passed - Valid encounter within last 12 months    Recent Outpatient Visits           1 week ago Stage 3b chronic kidney disease (CKD) ([place])   [place], [place], PA-C   4 weeks ago Hospital discharge follow-up   [place], [place], PA-C   1 month ago Controlled type 2 diabetes mellitus with complication, without long-term current use of insulin ([place])   [facility] [place], [place], PA-C   2 months ago Controlled type 2 diabetes mellitus with complication, without long-term current use of insulin ([place])   [facility] [place], [place], PA-C   2 months ago Chronic pain of both knees   [employer], [place], PA-C       Future Appointments             In 2 weeks [name], PA-C [facility], [place]   In 2 months Bacigalupo, Dionne Bucy, MD  [facility], [place]   In 4 months Agbor-Etang, Aaron Edelman, MD [place]. [place]

## 2021-12-10 ENCOUNTER — Other Ambulatory Visit: Payer: Self-pay | Admitting: Family Medicine

## 2021-12-10 NOTE — Telephone Encounter (Signed)
Requested medication (s) are due for refill today - yes  Requested medication (s) are on the active medication list -yes  Future visit scheduled -yes  Last refill: 11/12/21 #90 1RF  Notes to clinic: outside provider Rx  Requested Prescriptions  Pending Prescriptions Disp Refills   hydrALAZINE (APRESOLINE) 50 MG tablet 90 tablet 1    Sig: Take 1 tablet (50 mg total) by mouth every 8 (eight) hours.     Cardiovascular:  Vasodilators Failed - 12/10/2021 11:26 AM      Failed - ANA Screen, Ifa, Serum in normal range and within 360 days    No results found for: "ANA", "ANATITER", "LABANTI"       Passed - HCT in normal range and within 360 days    HCT  Date Value Ref Range Status  11/12/2021 42.3 39.0 - 52.0 % Final   Hematocrit  Date Value Ref Range Status  04/22/2020 46.9 37.5 - 51.0 % Final         Passed - HGB in normal range and within 360 days    Hemoglobin  Date Value Ref Range Status  11/12/2021 13.7 13.0 - 17.0 g/dL Final  04/22/2020 15.6 13.0 - 17.7 g/dL Final         Passed - RBC in normal range and within 360 days    RBC  Date Value Ref Range Status  11/12/2021 4.56 4.22 - 5.81 MIL/uL Final         Passed - WBC in normal range and within 360 days    WBC  Date Value Ref Range Status  11/12/2021 6.0 4.0 - 10.5 K/uL Final         Passed - PLT in normal range and within 360 days    Platelets  Date Value Ref Range Status  11/12/2021 246 150 - 400 K/uL Final  04/22/2020 221 150 - 450 x10E3/uL Final         Passed - Last BP in normal range    BP Readings from Last 1 Encounters:  11/30/21 120/72         Passed - Valid encounter within last 12 months    Recent Outpatient Visits           2 weeks ago Stage 3b chronic kidney disease (CKD) ([place])   [place], [place], PA-C   1 month ago Hospital discharge follow-up   [place], [place], PA-C   1 month ago Controlled type 2 diabetes mellitus with complication,  without long-term current use of insulin ([place])   [facility] [place], [place], PA-C   2 months ago Controlled type 2 diabetes mellitus with complication, without long-term current use of insulin ([place])   [facility] [place], [place], PA-C   2 months ago Chronic pain of both knees   [employer], [place], PA-C       Future Appointments             In 1 week [name], PA-C [facility], [place]   In 2 months Bacigalupo, Dionne Bucy, MD [employer], [place]   In 4 months Agbor-Etang, Aaron Edelman, MD [place]. [facility]               Requested Prescriptions  Pending Prescriptions Disp Refills   hydrALAZINE (APRESOLINE) 50 MG tablet 90 tablet 1    Sig: Take 1 tablet (50 mg total) by mouth every 8 (eight) hours.  Cardiovascular:  Vasodilators Failed - 12/10/2021 11:26 AM      Failed - ANA Screen, Ifa, Serum in normal range and within 360 days    No results found for: "ANA", "ANATITER", "LABANTI"       Passed - HCT in normal range and within 360 days    HCT  Date Value Ref Range Status  11/12/2021 42.3 39.0 - 52.0 % Final   Hematocrit  Date Value Ref Range Status  04/22/2020 46.9 37.5 - 51.0 % Final         Passed - HGB in normal range and within 360 days    Hemoglobin  Date Value Ref Range Status  11/12/2021 13.7 13.0 - 17.0 g/dL Final  04/22/2020 15.6 13.0 - 17.7 g/dL Final         Passed - RBC in normal range and within 360 days    RBC  Date Value Ref Range Status  11/12/2021 4.56 4.22 - 5.81 MIL/uL Final         Passed - WBC in normal range and within 360 days    WBC  Date Value Ref Range Status  11/12/2021 6.0 4.0 - 10.5 K/uL Final         Passed - PLT in normal range and within 360 days    Platelets  Date Value Ref Range Status  11/12/2021 246 150 - 400 K/uL Final  04/22/2020 221 150 - 450 x10E3/uL Final         Passed - Last BP in normal  range    BP Readings from Last 1 Encounters:  11/30/21 120/72         Passed - Valid encounter within last 12 months    Recent Outpatient Visits           2 weeks ago Stage 3b chronic kidney disease (CKD) ([place])   [place], [place], PA-C   1 month ago Hospital discharge follow-up   [place], [place], PA-C   1 month ago Controlled type 2 diabetes mellitus with complication, without long-term current use of insulin ([place])   [facility] [place], [place], PA-C   2 months ago Controlled type 2 diabetes mellitus with complication, without long-term current use of insulin ([place])   [facility] [place], [place], PA-C   2 months ago Chronic pain of both knees   [employer], [place], PA-C       Future Appointments             In 1 week [name], PA-C [facility], [place]   In 2 months Bacigalupo, Dionne Bucy, MD [employer], [place]   In 4 months Agbor-Etang, Aaron Edelman, MD [place]. [place]

## 2021-12-10 NOTE — Telephone Encounter (Signed)
Medication Refill - Medication: hydrALAZINE (APRESOLINE) 50 MG tablet  Patient only has 1 left Has the patient contacted their pharmacy? yes (Agent: If no, request that the patient contact the pharmacy for the refill. If patient does not wish to contact the pharmacy document the reason why and proceed with request.) (Agent: If yes, when and what did the pharmacy advise?)contact pcp  Preferred Pharmacy (with phone number or street name):  [facility] [place], [place] - [place] AT [place] Phone:  [phone]  Fax:  [phone]     Has the patient been seen for an appointment in the last year OR does the patient have an upcoming appointment? yes  Agent: Please be advised that RX refills may take up to 3 business days. We ask that you follow-up with your pharmacy.

## 2021-12-13 MED ORDER — HYDRALAZINE HCL 50 MG PO TABS
50.0000 mg | ORAL_TABLET | Freq: Three times a day (TID) | ORAL | 1 refills | Status: DC
Start: 2021-12-13 — End: 2021-12-14

## 2021-12-13 NOTE — Telephone Encounter (Signed)
Needs to have appropriate labs/per protocol in a week at his FU appt

## 2021-12-14 ENCOUNTER — Other Ambulatory Visit: Payer: Self-pay | Admitting: Physician Assistant

## 2021-12-23 ENCOUNTER — Encounter: Payer: Self-pay | Admitting: Physician Assistant

## 2021-12-23 ENCOUNTER — Ambulatory Visit (INDEPENDENT_AMBULATORY_CARE_PROVIDER_SITE_OTHER): Payer: Medicare Other | Admitting: Physician Assistant

## 2021-12-23 VITALS — BP 140/74 | HR 64 | Resp 16 | Wt 280.0 lb

## 2021-12-23 DIAGNOSIS — I25112 Atherosclerotic heart disease of native coronary artery with refractory angina pectoris: Secondary | ICD-10-CM

## 2021-12-23 DIAGNOSIS — N1831 Chronic kidney disease, stage 3a: Secondary | ICD-10-CM | POA: Diagnosis not present

## 2021-12-23 DIAGNOSIS — J441 Chronic obstructive pulmonary disease with (acute) exacerbation: Secondary | ICD-10-CM

## 2021-12-23 DIAGNOSIS — E1169 Type 2 diabetes mellitus with other specified complication: Secondary | ICD-10-CM | POA: Diagnosis not present

## 2021-12-23 DIAGNOSIS — E785 Hyperlipidemia, unspecified: Secondary | ICD-10-CM

## 2021-12-23 DIAGNOSIS — M25562 Pain in left knee: Secondary | ICD-10-CM

## 2021-12-23 DIAGNOSIS — I152 Hypertension secondary to endocrine disorders: Secondary | ICD-10-CM

## 2021-12-23 DIAGNOSIS — G4733 Obstructive sleep apnea (adult) (pediatric): Secondary | ICD-10-CM | POA: Diagnosis not present

## 2021-12-23 DIAGNOSIS — I120 Hypertensive chronic kidney disease with stage 5 chronic kidney disease or end stage renal disease: Secondary | ICD-10-CM

## 2021-12-23 DIAGNOSIS — E1159 Type 2 diabetes mellitus with other circulatory complications: Secondary | ICD-10-CM

## 2021-12-23 DIAGNOSIS — E118 Type 2 diabetes mellitus with unspecified complications: Secondary | ICD-10-CM

## 2021-12-23 NOTE — Progress Notes (Signed)
Established patient visit   Patient: Tony Williams   DOB: 1945-05-25   76 y.o. Male  MRN: [id number] Visit Date: 12/23/2021  Today's healthcare provider: [name], PA-C   Chief Complaint  Patient presents with   Follow-up   Subjective    HPI  Patient is here for 4 week follow up. Was seen by Cardiology on 11/30/21 for CAD with stable angina, HTN, HLD, CHF, CKD stage 3, was advised to fu in 4 mo  Pt did not schedule a FU with nephrology, pulmonology Has been having unstable BP at home Reports that he is not feeling well  Managed chronic knee pain by Ortho. Scheduled an appt with them  Takes maintenance inhaler for COPD daily and albuterol as needed.  Medications: Outpatient Medications Prior to Visit  Medication Sig   acetaminophen (TYLENOL) 325 MG tablet Take 2 tablets (650 mg total) by mouth every 6 (six) hours as needed for mild pain (or Fever >/= 101).   albuterol (VENTOLIN HFA) 108 (90 Base) MCG/ACT inhaler Inhale 2 puffs into the lungs every 6 (six) hours as needed for wheezing or shortness of breath.   allopurinol (ZYLOPRIM) 100 MG tablet Take 1 tablet (100 mg total) by mouth daily.   amLODipine (NORVASC) 10 MG tablet TAKE 1 TABLET(10 MG) BY MOUTH DAILY   aspirin EC 81 MG tablet Take 81 mg by mouth daily. Swallow whole.   Budeson-Glycopyrrol-Formoterol (BREZTRI AEROSPHERE) 160-9-4.8 MCG/ACT AERO Inhale 2 puffs into the lungs 2 (two) times daily.   Cholecalciferol (VITAMIN D3 PO) Take 4,000 Units by mouth.   clopidogrel (PLAVIX) 75 MG tablet Take 1 tablet (75 mg total) by mouth daily.   Coenzyme Q10 (CO Q-10) 400 MG CAPS Take 400 mg by mouth daily at 6 (six) AM.   desoximetasone (TOPICORT) 0.25 % cream Apply 1 application. topically 2 (two) times daily.   escitalopram (LEXAPRO) 20 MG tablet TAKE 1 TABLET(20 MG) BY MOUTH DAILY   fenofibrate (TRICOR) 145 MG tablet Take 1 tablet (145 mg total) by mouth daily.   gabapentin (NEURONTIN) 300 MG capsule Take 1  capsule (300 mg total) by mouth 2 (two) times daily AND 2 capsules (600 mg total) at bedtime.   glimepiride (AMARYL) 1 MG tablet TAKE 1 TABLET(1 MG) BY MOUTH DAILY WITH BREAKFAST   hydrALAZINE (APRESOLINE) 50 MG tablet TAKE 1 TABLET(50 MG) BY MOUTH EVERY 8 HOURS   HYDROcodone-acetaminophen (NORCO/VICODIN) 5-325 MG tablet Take 1 tablet by mouth every 6 (six) hours as needed for moderate pain.   loratadine (CLARITIN) 10 MG tablet Take 1 tablet (10 mg total) by mouth daily.   lovastatin (MEVACOR) 40 MG tablet TAKE 1 TABLET(40 MG) BY MOUTH DAILY   nitroGLYCERIN (NITROSTAT) 0.4 MG SL tablet Place 1 tablet (0.4 mg total) under the tongue every 5 (five) minutes as needed for chest pain.   Omega-3 Fatty Acids (FISH OIL) 1200 MG CAPS Take 1,200 mg by mouth daily at 6 (six) AM.   torsemide (DEMADEX) 20 MG tablet Take 1 tablet (20 mg total) by mouth daily as needed.   traZODone (DESYREL) 150 MG tablet Take 1 tablet (150 mg total) by mouth at bedtime.   No facility-administered medications prior to visit.    Review of Systems  Constitutional:  Negative for appetite change, chills and fever.  Respiratory:  Negative for chest tightness, shortness of breath and wheezing.   Cardiovascular:  Negative for chest pain and palpitations.  Gastrointestinal:  Negative for abdominal pain, nausea and vomiting.  Objective    BP (!) 140/74 (BP Location: Right Arm, Patient Position: Sitting, Cuff Size: Large)   Pulse 64   Resp 16   Wt 280 lb (127 kg)   SpO2 91%   BMI 40.18 kg/m    Physical Exam Vitals reviewed.  Constitutional:      General: He is not in acute distress.    Appearance: Normal appearance. He is not diaphoretic.  HENT:     Head: Normocephalic and atraumatic.     Nose: Nose normal.  Eyes:     General: No scleral icterus.    Extraocular Movements: Extraocular movements intact.     Conjunctiva/sclera: Conjunctivae normal.     Pupils: Pupils are equal, round, and reactive to light.   Cardiovascular:     Rate and Rhythm: Normal rate and regular rhythm.     Pulses: Normal pulses.     Heart sounds: Normal heart sounds. No murmur heard. Pulmonary:     Effort: Pulmonary effort is normal. No respiratory distress.     Breath sounds: Rhonchi and rales present. No wheezing.  Abdominal:     General: Abdomen is flat. Bowel sounds are normal.     Palpations: Abdomen is soft.  Musculoskeletal:        General: Normal range of motion.     Cervical back: Normal range of motion and neck supple.     Right lower leg: No edema.     Left lower leg: No edema.  Lymphadenopathy:     Cervical: No cervical adenopathy.  Skin:    General: Skin is warm and dry.     Findings: No rash.  Neurological:     Mental Status: He is alert and oriented to person, place, and time. Mental status is at baseline.  Psychiatric:        Behavior: Behavior normal.        Thought Content: Thought content normal.        Judgment: Judgment normal.       No results found for any visits on 12/23/21.  Assessment & Plan     Hypertensive kidney disease with CKD (chronic kidney disease) stage 3 (HCC)  - Ambulatory referral to Nephrology - Basic metabolic panel  Hypertension associated with diabetes ([place]) BP 140/74 Pt reports having BP between 160-180 at home Was requested to start BP log and bring with him to the next appt Was requested to bring his BP cuff to the next appt with [place] -BMP Continue amlodipine, hydralazine '50mg'$  TID and torsemide  OSA (obstructive sleep apnea) On CPAP, well-controlled? Encouraged weight loss control  Hyperlipidemia associated with type 2 diabetes mellitus ([place]) Continue current medication/lovastatin and tricor. Advised to adhere to low fat diet and daily exercise Weight loss of 5% encouraged  Controlled type 2 diabetes mellitus with complication, without long-term current use of insulin (HCC) - BMP  Morbid obesity (HCC)  Weight loss of 5% of pt's current weight  via healthy diet and daily exercise encouraged.  Acute pain of left knee Managed by Ortho  Hx of CABG Coronary artery disease involving coronary bypass graft of native heart without angina pectoris  Chronic diastolic CHF Was admitted with NSTEMI, not taken for cath due to CKD and atypical symptoms Myoview showed no significant ischemia, fixed defect, basal to mid inferior HK, moderate risk scan. Echo showed LVEF 50-55%, G1DD, mild MR, mild AI. Continue Aspirin and Plavix Continue Lovastatin and fenofibrte Continue torsemide and Cardiology planned to add SGLT2I at [facility] Managed by Cardiology  COPD  Abnormal lung sound Might need change in COPD management Continue albuterol and [place] Pt was requested to contact Pulmonology Per wife/via phone conversation, she confirmed that she will contact Pulmonology and Nephrology for FU appt  Advised to proceed with CXR. However, pt seemed reluctant? Might need to start on prednisone or abx depending on imaging results?  FU as scheduled   The patient was advised to call back or seek an in-person evaluation if the symptoms worsen or if the condition fails to improve as anticipated.  I discussed the assessment and treatment plan with the patient. The patient was provided an opportunity to ask questions and all were answered. The patient agreed with the plan and demonstrated an understanding of the instructions.  The entirety of the information documented in the History of Present Illness, Review of Systems and Physical Exam were personally obtained by me. Portions of this information were initially documented by the CMA and reviewed by me for thoroughness and accuracy.  Portions of this note were created using dictation software and may contain typographical errors.       Total encounter time more than 30 minutes  Greater than 50% was spent in counseling and coordination of care with the patient  [place]atherwood  [facility] [phone] (phone) [phone] (fax)  [place]

## 2021-12-24 ENCOUNTER — Telehealth: Payer: Self-pay | Admitting: Physician Assistant

## 2021-12-24 NOTE — Telephone Encounter (Addendum)
Patient's wife Estill Batten was advised. Clara stated she would advise patient about CXR and pulmonology.

## 2021-12-24 NOTE — Telephone Encounter (Signed)
Please, check with pt if agrees to do CXR today and I will place an order for him Still encouraged to see pulmonology

## 2021-12-28 ENCOUNTER — Other Ambulatory Visit
Admission: RE | Admit: 2021-12-28 | Discharge: 2021-12-28 | Disposition: A | Discharge status: Home / Self Care | Payer: Medicare Other | Source: Ambulatory Visit | Attending: Pulmonary Disease | Admitting: Pulmonary Disease

## 2021-12-28 ENCOUNTER — Ambulatory Visit
Admission: RE | Admit: 2021-12-28 | Discharge: 2021-12-28 | Disposition: A | Discharge status: Home / Self Care | Payer: Medicare Other | Source: Ambulatory Visit | Attending: Pulmonary Disease | Admitting: Pulmonary Disease

## 2021-12-28 ENCOUNTER — Ambulatory Visit: Payer: Medicare Other | Admitting: Pulmonary Disease

## 2021-12-28 ENCOUNTER — Encounter: Payer: Self-pay | Admitting: Pulmonary Disease

## 2021-12-28 VITALS — BP 138/70 | HR 54 | Temp 97.5°F | Ht 70.0 in | Wt 280.6 lb

## 2021-12-28 DIAGNOSIS — N183 Chronic kidney disease, stage 3 unspecified: Secondary | ICD-10-CM | POA: Diagnosis not present

## 2021-12-28 DIAGNOSIS — E662 Morbid (severe) obesity with alveolar hypoventilation: Secondary | ICD-10-CM | POA: Diagnosis not present

## 2021-12-28 DIAGNOSIS — I5032 Chronic diastolic (congestive) heart failure: Secondary | ICD-10-CM

## 2021-12-28 DIAGNOSIS — R0609 Other forms of dyspnea: Secondary | ICD-10-CM

## 2021-12-28 DIAGNOSIS — J4489 Other specified chronic obstructive pulmonary disease: Secondary | ICD-10-CM | POA: Diagnosis not present

## 2021-12-28 DIAGNOSIS — R0602 Shortness of breath: Secondary | ICD-10-CM | POA: Diagnosis not present

## 2021-12-28 DIAGNOSIS — E7800 Pure hypercholesterolemia, unspecified: Secondary | ICD-10-CM

## 2021-12-28 DIAGNOSIS — G4733 Obstructive sleep apnea (adult) (pediatric): Secondary | ICD-10-CM | POA: Diagnosis not present

## 2021-12-28 LAB — CBC WITH DIFFERENTIAL/PLATELET
Abs Immature Granulocytes: 0.04 10*3/uL (ref 0.00–0.07)
Basophils Absolute: 0.1 10*3/uL (ref 0.0–0.1)
Basophils Relative: 1 %
Eosinophils Absolute: 0.4 10*3/uL (ref 0.0–0.5)
Eosinophils Relative: 5 %
HCT: 45 % (ref 39.0–52.0)
Hemoglobin: 14.2 g/dL (ref 13.0–17.0)
Immature Granulocytes: 1 %
Lymphocytes Relative: 14 %
Lymphs Abs: 1.2 10*3/uL (ref 0.7–4.0)
MCH: 29 pg (ref 26.0–34.0)
MCHC: 31.6 g/dL (ref 30.0–36.0)
MCV: 92 fL (ref 80.0–100.0)
Monocytes Absolute: 0.8 10*3/uL (ref 0.1–1.0)
Monocytes Relative: 10 %
Neutro Abs: 6 10*3/uL (ref 1.7–7.7)
Neutrophils Relative %: 69 %
Platelets: 258 10*3/uL (ref 150–400)
RBC: 4.89 MIL/uL (ref 4.22–5.81)
RDW: 14.2 % (ref 11.5–15.5)
WBC: 8.5 10*3/uL (ref 4.0–10.5)
nRBC: 0 % (ref 0.0–0.2)

## 2021-12-28 LAB — COMPREHENSIVE METABOLIC PANEL
ALT: 23 U/L (ref 0–44)
AST: 20 U/L (ref 15–41)
Albumin: 3.8 g/dL (ref 3.5–5.0)
Alkaline Phosphatase: 42 U/L (ref 38–126)
Anion gap: 9 (ref 5–15)
BUN: 35 mg/dL — ABNORMAL HIGH (ref 8–23)
CO2: 26 mmol/L (ref 22–32)
Calcium: 9.5 mg/dL (ref 8.9–10.3)
Chloride: 106 mmol/L (ref 98–111)
Creatinine, Ser: 2.23 mg/dL — ABNORMAL HIGH (ref 0.61–1.24)
GFR, Estimated: 30 mL/min — ABNORMAL LOW (ref >60)
Glucose, Bld: 161 mg/dL — ABNORMAL HIGH (ref 70–99)
Potassium: 5.3 mmol/L — ABNORMAL HIGH (ref 3.5–5.1)
Sodium: 141 mmol/L (ref 135–145)
Total Bilirubin: 0.5 mg/dL (ref 0.3–1.2)
Total Protein: 7.4 g/dL (ref 6.5–8.1)

## 2021-12-28 LAB — LIPID PANEL
Cholesterol: 172 mg/dL (ref 0–200)
HDL: 37 mg/dL — ABNORMAL LOW (ref >40)
LDL Cholesterol: 98 mg/dL (ref 0–99)
Total CHOL/HDL Ratio: 4.6 RATIO
Triglycerides: 184 mg/dL — ABNORMAL HIGH (ref <150)
VLDL: 37 mg/dL (ref 0–40)

## 2021-12-28 LAB — BRAIN NATRIURETIC PEPTIDE: B Natriuretic Peptide: 202.9 pg/mL — ABNORMAL HIGH (ref 0.0–100.0)

## 2021-12-28 NOTE — Patient Instructions (Signed)
I think your kidneys are getting worse and not handling fluid around your lungs and heart correctly.  Need to make an appointment with your kidney doctor.  We are getting some blood work and a chest x-ray today.  While you walk today, your oxygen level drops so you will need oxygen when you are active and walking.  We have sent a request to [facility] for that.  We will also order an oxygen level while you sleep on your BiPAP.  You will need a follow-up in 3 to 4 weeks time we will try to arrange that with either Dr. Halford Chessman or one of the nurse practitioners.

## 2021-12-28 NOTE — Progress Notes (Addendum)
Subjective:    Patient ID: Tony Williams, male    DOB: [birth year], 76 y.o.   MRN: [id number] Patient Care Team: Virginia Crews, MD as PCP - General (Family Medicine) Kate Sable, MD as Consulting Physician (Cardiology) Chesley Mires, MD as Consulting Physician (Pulmonary Disease)  Chief Complaint  Patient presents with   Follow-up    SOB worse of the last couple of months.     HPI Patient is a 76 year old former smoker (78 PY) with a history as noted below, who presents for an acute visit for increasing shortness of breath over the last several months.  Patient is usually followed by Dr. Chesley Mires for issues with obstructive sleep apnea which requires BiPAP.  Patient notes that shortness of breath became worse approximately 2 to 3 months ago it is worse on exertion but notes that at rest as well.  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  Has a history of COPD though his physiology is mostly restrictive with no bronchodilator response.  Patient also states that inhalers do not do much for him.  Patient has not had any chest pain, no fevers, chills or sweats.  No significant cough or sputum production.  No hemoptysis.  Has chronic two-pillow orthopnea.  Admitted to [facility] on 16 August for evaluation of chest pain discharged on 19 August.  Discharge diagnosis was stable angina.  Diuretics were held.  Instructed to follow-up with PCP, cardiology and nephrology.  Has not followed with nephrology yet.  She is followed by cardiology and nephrology though he states he has not seen nephrology recently.  Last office visit with nephrology was on 13 June with Dr. Candiss Norse.  He is stage IIIb CKD.  TEST/EVENTS :  CT angio chest 11/08/20 >> fibrothorax on Rt with volume loss, negative for PE Echo 11/16/20 >> EF 60 to 65%    Split night >> AHI 68/h,  lowest desat 84% BiPAP 18/14 + med FF mask   PFT 01/22/2021 moderate restriction last month. With FEV1 at 51%, ratio 75, FVC 49%, no BD response., DLCO at 54% .   Review of Systems A 10 point review of systems was performed and it is as noted above otherwise negative.  Past Medical History:  Diagnosis Date   Bladder cancer ([place]) 2013   CAD (coronary artery disease)    a. 2007 CABG x 4: LIMA->D1, VG->OM1, VG->D2->mLAD; b. 2018 NSTEMI/PCI: DES to VG->OM; c. 10/2020 NSTEMI/PCI: LM 90d, LAD 173m D1 100, LCX 100ost/p, RCA sev diff dzs, VG->D2->mLAD 99/90 before D2 (4.0x15 Onyx Frontier DES), 90 before mLAD (4.0x15 Onyx Frontier DES), LIMA->D1 ok, VG->OM1 100.   Chronic heart failure with preserved ejection fraction (HFpEF) ([place]    a. 10/2020 Echo: EF 50-55%, apical HK, GrII DD, mod dil LA; b. 11/2020 Echo: EF 60-65%, no rwma.   Chronic kidney disease    COPD (chronic obstructive pulmonary disease) (HCC)    Depression    Diabetes mellitus without complication (HCC)    Hx of CABG x 4    LIMA-D2, SVG-OM (occluded), SeqSVG-D1-LAD.   Labile hypertension    Myocardial infarction ([facility]    Past Surgical History:  Procedure Laterality Date   CHOLECYSTECTOMY, LAPAROSCOPIC  12/26/1995   COLONOSCOPY WITH PROPOFOL N/A 09/23/2020   Procedure: COLONOSCOPY WITH PROPOFOL;  Surgeon: WLucilla Lame MD;  [facility];  Service: Endoscopy;  Laterality:  N/A;   CORONARY ARTERY BYPASS GRAFT     LIMA-D2, SVG-OM, SeqSVG-D1-LAD   CORONARY STENT INTERVENTION N/A 11/12/2020   Procedure: CORONARY STENT INTERVENTION;  Surgeon: Wellington Hampshire, MD;  Location: [place] CV LAB;  Service: Cardiovascular;  Laterality: N/A;   EYE SURGERY     IABP INSERTION N/A 11/11/2020   Procedure: IABP Insertion;  Surgeon: Nelva Bush, MD;  Location: [place] [facility];  Service: Cardiovascular;  Laterality: N/A;   IR STENT PLACEMENT ANTE CAROTID INC ANGIO     KNEE SURGERY     RIGHT/LEFT HEART CATH AND  CORONARY/GRAFT ANGIOGRAPHY N/A 11/11/2020   Procedure: RIGHT/LEFT HEART CATH AND CORONARY/GRAFT ANGIOGRAPHY;  Surgeon: Nelva Bush, MD;  Location: [place] [facility];  Service: Cardiovascular;  Severe native CAD: 90% dLMCA-100% mLAD/100% ost LCx CTO w/ Severe diffuse RCA disease. Widely patent LIMA-D1. Patent sequential SVG-D2-LAD with 99% mid graft & 90% LIMA-D2 @ anastomosis. previously stented SVG-OM - CTO. Mildly   TRANSURETHRAL RESECTION OF PROSTATE  1994   URINARY SPHINCTER IMPLANT  1995   Patient Active Problem List   Diagnosis Date Noted   Hypoxia 11/11/2021   Hypertensive urgency 11/10/2021   Hx of CABG 10/29/2021   Acute renal failure superimposed on stage 3a chronic kidney disease ([place]) 10/29/2021   Acute respiratory failure with hypoxia ([place]) 10/28/2021   Unstable angina ([place]) 10/28/2021   Hypertensive emergency 10/28/2021   Obesity (BMI 30-39.9) 10/28/2021   Senile purpura ([place]) 08/06/2021   OSA (obstructive sleep apnea) 05/29/2021   Daytime sleepiness 02/12/2021   Idiopathic hypotension 12/16/2020   Hx of myocardial infarction 12/16/2020   Bradycardia 12/16/2020   Dizziness 12/16/2020   Symptomatic sinus bradycardia 12/08/2020   Dyspnea 11/15/2020   Acute on chronic diastolic CHF (congestive heart failure) ([place]) 11/10/2020   NSTEMI (non-ST elevated myocardial infarction) ([place]) 11/08/2020   Polyp of transverse colon    Morbid obesity ([place]) 07/21/2020   Chronic pain of both knees 07/21/2020   Diabetic polyneuropathy associated with type 2 diabetes mellitus ([place]) 07/21/2020   Stage 3a chronic kidney disease ([place]) 07/21/2020   Chronic gout due to renal impairment without tophus 07/21/2020   Hypertension associated with diabetes ([place]) 07/21/2020   Hyperlipidemia associated with type 2 diabetes mellitus ([place]) 07/21/2020   GAD (generalized anxiety disorder) 07/21/2020   Psychophysiological insomnia 07/21/2020   Screening for blood or protein in urine 04/18/2020    Vitamin D insufficiency 04/18/2020   Chronic obstructive pulmonary disease ([place]) 04/18/2020   Type 2 diabetes mellitus with stage 3a chronic kidney disease, without long-term current use of insulin ([place]) 04/18/2020   Coronary artery disease involving coronary bypass graft of native heart without angina pectoris 04/18/2020   S/P primary angioplasty with coronary stent 04/18/2020   History of smoking at least 1 pack per day for at least 30 years 04/18/2020   Family History  Problem Relation Age of Onset   Heart Problems Mother    Heart Problems Father    Social History   Tobacco Use   Smoking status: Former    Packs/day: 1.00    Years: 30.00    Total pack years: 30.00    Types: Cigarettes    Quit date: 10/23/2003    Years since quitting: 18.1   Smokeless tobacco: Never  Substance Use Topics   Alcohol use: Never   Allergies  Allergen Reactions   Butorphanol Itching   Morphine Other (See Comments)    Redness around IV site   Pedi-Pre Tape Spray [Wound  Dressing Adhesive] Itching   Statins Other (See Comments)    "Destroyed my muscles"   Zocor [Simvastatin] Other (See Comments)    "Destroyed my muscles"   Current Meds  Medication Sig   acetaminophen (TYLENOL) 325 MG tablet Take 2 tablets (650 mg total) by mouth every 6 (six) hours as needed for mild pain (or Fever >/= 101).   albuterol (VENTOLIN HFA) 108 (90 Base) MCG/ACT inhaler Inhale 2 puffs into the lungs every 6 (six) hours as needed for wheezing or shortness of breath.   allopurinol (ZYLOPRIM) 100 MG tablet Take 1 tablet (100 mg total) by mouth daily.   amLODipine (NORVASC) 10 MG tablet TAKE 1 TABLET(10 MG) BY MOUTH DAILY   aspirin EC 81 MG tablet Take 81 mg by mouth daily. Swallow whole.   Budeson-Glycopyrrol-Formoterol (BREZTRI AEROSPHERE) 160-9-4.8 MCG/ACT AERO Inhale 2 puffs into the lungs 2 (two) times daily.   Cholecalciferol (VITAMIN D3 PO) Take 4,000 Units by mouth.   clopidogrel (PLAVIX) 75 MG tablet Take 1  tablet (75 mg total) by mouth daily.   Coenzyme Q10 (CO Q-10) 400 MG CAPS Take 400 mg by mouth daily at 6 (six) AM.   desoximetasone (TOPICORT) 0.25 % cream Apply 1 application. topically 2 (two) times daily.   escitalopram (LEXAPRO) 20 MG tablet TAKE 1 TABLET(20 MG) BY MOUTH DAILY   fenofibrate (TRICOR) 145 MG tablet Take 1 tablet (145 mg total) by mouth daily.   gabapentin (NEURONTIN) 300 MG capsule Take 1 capsule (300 mg total) by mouth 2 (two) times daily AND 2 capsules (600 mg total) at bedtime.   glimepiride (AMARYL) 1 MG tablet TAKE 1 TABLET(1 MG) BY MOUTH DAILY WITH BREAKFAST   hydrALAZINE (APRESOLINE) 50 MG tablet TAKE 1 TABLET(50 MG) BY MOUTH EVERY 8 HOURS   HYDROcodone-acetaminophen (NORCO/VICODIN) 5-325 MG tablet Take 1 tablet by mouth every 6 (six) hours as needed for moderate pain.   lovastatin (MEVACOR) 40 MG tablet TAKE 1 TABLET(40 MG) BY MOUTH DAILY   nitroGLYCERIN (NITROSTAT) 0.4 MG SL tablet Place 1 tablet (0.4 mg total) under the tongue every 5 (five) minutes as needed for chest pain.   Omega-3 Fatty Acids (FISH OIL) 1200 MG CAPS Take 1,200 mg by mouth daily at 6 (six) AM.   torsemide (DEMADEX) 20 MG tablet Take 1 tablet (20 mg total) by mouth daily as needed.   traZODone (DESYREL) 150 MG tablet Take 1 tablet (150 mg total) by mouth at bedtime.   Immunization History  Administered Date(s) Administered   Fluad Quad(high Dose 65+) 12/10/2020   Influenza, High Dose Seasonal PF 01/13/2020   Moderna Sars-Covid-2 Vaccination 04/05/2019, 05/03/2019, 01/10/2020   Pneumococcal Conjugate-13 10/10/2014   Pneumococcal Polysaccharide-23 12/29/2018, 07/21/2020   Td 05/10/2017       Objective:   Physical Exam BP 138/70 (BP Location: Left Arm, Cuff Size: Large)   Pulse (!) 54   Temp (!) 97.5 F (36.4 C)   Ht _0  (1.778 m)   Wt 280 lb 9.6 oz (127.3 kg)   SpO2 92%   BMI 40.26 kg/m  GENERAL: Morbidly obese gentleman, fully ambulatory.  No conversational dyspnea. HEAD:  Normocephalic, atraumatic.  EYES: Pupils equal, round, reactive to light.  No scleral icterus.  MOUTH: Poor dentition, oral mucosa moist. NECK: Supple. No thyromegaly. Trachea midline. No JVD.  No adenopathy. PULMONARY: Good air entry bilaterally.  Dry crackles right base otherwise, no adventitious sounds. CARDIOVASCULAR: S1 and S2. Regular rate and rhythm.  No rubs, murmurs or gallops heard. ABDOMEN:  Protuberant, otherwise benign. MUSCULOSKELETAL: No joint deformity, no clubbing, there is 2+ edema of the lower extremities.  NEUROLOGIC: No overt focal deficit. SKIN: Intact,warm,dry.  Chronic stasis changes of the lower extremities PSYCH: Flat affect, normal behavior.  Ambulatory oximetry performed today: To ambulate approximately 500 feet.  Desaturated on room air to 87%, placed on 1 L/min and was able to maintain 90% or better   Chest x-ray today shows chronic scarring on the right base, cardiomegaly and stable reticulonodular changes in the lung bases:      Assessment & Plan:     ICD-10-CM   1. DOE (dyspnea on exertion)  R06.09 DG Chest 2 View    Pulse oximetry, overnight    AMB REFERRAL FOR DME    B Nat Peptide    CBC w/Diff    CANCELED: CBC w/Diff    CANCELED: B Nat Peptide   Suspect multifactorial Suspect volume overload due to renal disease Possible decompensation of diastolic heart failure Labs ordered as below    2. OSA treated with BiPAP  G47.33 Pulse oximetry, overnight    AMB REFERRAL FOR DME   We will need to obtain overnight oximetry on BiPAP To ensure patient is getting adequate oxygenation    3. Obesity with alveolar hypoventilation and body mass index (BMI) of 40 or greater (HCC)  E66.2    This adds complexity to his management Likely adds to his sensation of dyspnea Will need supplemental oxygen on exertion due to alveolar hypoventilation    4. COPD (chronic obstructive pulmonary disease) with chronic bronchitis  J44.89    No bronchospasm noted  today Patient's PFTs more consistent with restriction    5. Stage 3 chronic kidney disease, unspecified whether stage 3a or 3b CKD (HCC)  N18.30 CBC w/Diff    Comp Met (CMET)    CANCELED: Comp Met (CMET)    CANCELED: CBC w/Diff    6. Chronic diastolic CHF (congestive heart failure) (HCC)  I50.32 B Nat Peptide    CANCELED: B Nat Peptide     Orders Placed This Encounter  Procedures   DG Chest 2 View    Standing Status:   Future    Number of Occurrences:   1    Standing Expiration Date:   04/30/2022    Order Specific Question:   Reason for Exam (SYMPTOM  OR DIAGNOSIS REQUIRED)    Answer:   Shortness of breath    Order Specific Question:   Preferred imaging location?    Answer:   [facility]   B Nat Peptide    Standing Status:   Future    Number of Occurrences:   1    Standing Expiration Date:   04/30/2022   CBC w/Diff    Standing Status:   Future    Number of Occurrences:   1    Standing Expiration Date:   04/30/2022   Comp Met (CMET)    Standing Status:   Future    Number of Occurrences:   1    Standing Expiration Date:   04/30/2022   AMB REFERRAL FOR DME    Referral Priority:   Routine    Referral Type:   Durable Medical Equipment Purchase    Number of Visits Requested:   1   Pulse oximetry, overnight    With BiPAP & [facility]    Standing Status:   Future    Standing Expiration Date:   12/29/2022   In review of the patient's recent laboratory data it  appears that his renal function is worsening.  We are rechecking laboratory data as above.  It was explained to the patient that I believe that his renal function is getting worse and he is volume overloaded.  He states that he will make an appointment with his renal physician.  Patient does have evidence of oxygen desaturations with ambulation and will need supplemental oxygen during ambulation.  We will also check that he is getting adequate oxygenation with his nocturnal BiPAP.  We will see him in follow-up in 3 to 4  weeks time with either Dr. Halford Chessman or available NP or MD at that time.  Contact us prior to that time should any new difficulties arise.  Total visit time 56 minutes.   Renold Don, MD Advanced Bronchoscopy PCCM [facility]    *This note was dictated using voice recognition software/Dragon.  Despite best efforts to proofread, errors can occur which can change the meaning. Any transcriptional errors that result from this process are unintentional and may not be fully corrected at the time of dictation.

## 2021-12-28 NOTE — Progress Notes (Deleted)
   Subjective:    Patient ID: Tony Williams, male    DOB: [birth year], 76 y.o.   MRN: [id number]  HPI    Review of Systems     Objective:   Physical Exam        Assessment & Plan:

## 2021-12-29 ENCOUNTER — Telehealth: Payer: Self-pay | Admitting: *Deleted

## 2021-12-29 DIAGNOSIS — G4733 Obstructive sleep apnea (adult) (pediatric): Secondary | ICD-10-CM | POA: Diagnosis not present

## 2021-12-29 NOTE — Telephone Encounter (Signed)
He needs to follow-up with his kidney doctor.  His potassium level is increasing and this should be taken care of ASAP.  I am adding nephrologist as an FYI.   Tyler Pita, MD  Claudette Head A, CMA Chest x-ray shows chronic changes nothing new.  Changes noted on the right base are related to his prior surgery.   Patient is aware of results and voiced his understanding.  Nothing further needed.

## 2021-12-30 ENCOUNTER — Other Ambulatory Visit: Payer: Self-pay | Admitting: Family Medicine

## 2021-12-30 NOTE — Telephone Encounter (Signed)
Pt called in to request a refill for his HYDROcodone-acetaminophen (NORCO/VICODIN) 5-325 MG tablet - pt was told by pharmacy to reach out to PCP to request.    Pharmacy:  [facility] [place], [address] Phone:  [phone]  Fax:  [phone]      Future ov: 02/08/22

## 2021-12-30 NOTE — Telephone Encounter (Signed)
Requested medication (s) are due for refill today: yes  Requested medication (s) are on the active medication list: yes Last refill:  12/07/21  Future visit scheduled: yes  Notes to clinic:  Unable to refill per protocol, cannot delegate.       Requested Prescriptions  Pending Prescriptions Disp Refills   HYDROcodone-acetaminophen (NORCO/VICODIN) 5-325 MG tablet 30 tablet 0    Sig: Take 1 tablet by mouth every 6 (six) hours as needed for moderate pain.     Not Delegated - Analgesics:  Opioid Agonist Combinations Failed - 12/30/2021 10:34 AM      Failed - This refill cannot be delegated      Failed - Urine Drug Screen completed in last 360 days      Passed - Valid encounter within last 3 months    Recent Outpatient Visits           1 week ago Hypertension associated with diabetes [facility])   [facility] [place], [place], PA-C   1 month ago Stage 3b chronic kidney disease (CKD) ([place])   [place], [place], PA-C   1 month ago Hospital discharge follow-up   [facility] [place], [place], PA-C   2 months ago Controlled type 2 diabetes mellitus with complication, without long-term current use of insulin ([place])   [facility] [place], [place], PA-C   3 months ago Controlled type 2 diabetes mellitus with complication, without long-term current use of insulin ([place])   [facility] [place], [place], PA-C       Future Appointments             In 3 weeks Armando Reichert, MD [place]   In 1 month Bacigalupo, Dionne Bucy, MD [facility], [place]   In 4 months [place], Aaron Edelman, MD [place]. [place]

## 2021-12-31 DIAGNOSIS — G4733 Obstructive sleep apnea (adult) (pediatric): Secondary | ICD-10-CM | POA: Diagnosis not present

## 2022-01-01 MED ORDER — HYDROCODONE-ACETAMINOPHEN 5-325 MG PO TABS: 1.0000 | ORAL_TABLET | Freq: Four times a day (QID) | ORAL | 0 refills | Status: DC | PRN

## 2022-01-04 ENCOUNTER — Telehealth: Payer: Self-pay | Admitting: Pulmonary Disease

## 2022-01-04 NOTE — Telephone Encounter (Signed)
Dr. Patsey Berthold has reviewed the patient's ONO. She said the patient does not need O2 while wearing the BiPAP.   I notified the patient. Nothing further needed.

## 2022-01-08 ENCOUNTER — Other Ambulatory Visit: Payer: Self-pay

## 2022-01-08 ENCOUNTER — Other Ambulatory Visit: Payer: Self-pay | Admitting: Family Medicine

## 2022-01-10 ENCOUNTER — Other Ambulatory Visit: Payer: Self-pay | Admitting: Family Medicine

## 2022-01-13 DIAGNOSIS — I10 Essential (primary) hypertension: Secondary | ICD-10-CM | POA: Diagnosis not present

## 2022-01-13 DIAGNOSIS — E1122 Type 2 diabetes mellitus with diabetic chronic kidney disease: Secondary | ICD-10-CM | POA: Diagnosis not present

## 2022-01-13 DIAGNOSIS — N1832 Chronic kidney disease, stage 3b: Secondary | ICD-10-CM | POA: Diagnosis not present

## 2022-01-13 DIAGNOSIS — R600 Localized edema: Secondary | ICD-10-CM | POA: Diagnosis not present

## 2022-01-15 ENCOUNTER — Encounter: Payer: Self-pay | Admitting: Pulmonary Disease

## 2022-01-21 ENCOUNTER — Other Ambulatory Visit: Payer: Self-pay | Admitting: Family Medicine

## 2022-01-21 DIAGNOSIS — F411 Generalized anxiety disorder: Secondary | ICD-10-CM

## 2022-01-21 NOTE — Telephone Encounter (Signed)
Requested Prescriptions  Pending Prescriptions Disp Refills   escitalopram (LEXAPRO) 20 MG tablet [Pharmacy Med Name: ESCITALOPRAM '20MG'$  TABLETS] 90 tablet 0    Sig: TAKE 1 TABLET(20 MG) BY MOUTH DAILY     Psychiatry:  Antidepressants - SSRI Passed - 01/21/2022  9:42 AM      Passed - Valid encounter within last 6 months    Recent Outpatient Visits           4 weeks ago Hypertension associated with diabetes [facility])   [facility] [place], [place], PA-C   1 month ago Stage 3b chronic kidney disease (CKD) ([place])   [place], [place], PA-C   2 months ago Hospital discharge follow-up   [facility] [place], [place], PA-C   2 months ago Controlled type 2 diabetes mellitus with complication, without long-term current use of insulin ([place])   [facility] [place], [place], PA-C   4 months ago Controlled type 2 diabetes mellitus with complication, without long-term current use of insulin ([place])   [facility] [place], [place], PA-C       Future Appointments             In 5 days Armando Reichert, MD [place]   In 2 weeks Bacigalupo, Dionne Bucy, MD [facility], [place]   In 3 months Ag[place], Aaron Edelman, MD [place]. [place]

## 2022-01-25 ENCOUNTER — Other Ambulatory Visit: Payer: Self-pay

## 2022-01-25 ENCOUNTER — Other Ambulatory Visit: Payer: Self-pay | Admitting: Family Medicine

## 2022-01-25 MED ORDER — TORSEMIDE 20 MG PO TABS: 20.0000 mg | ORAL_TABLET | Freq: Every day | ORAL | 2 refills | Status: DC | PRN

## 2022-01-26 ENCOUNTER — Encounter: Payer: Self-pay | Admitting: Student in an Organized Health Care Education/Training Program

## 2022-01-26 ENCOUNTER — Ambulatory Visit: Payer: Medicare Other | Admitting: Student in an Organized Health Care Education/Training Program

## 2022-01-26 VITALS — BP 146/62 | HR 86 | Temp 98.0°F | Ht 70.0 in | Wt 284.6 lb

## 2022-01-26 DIAGNOSIS — R0602 Shortness of breath: Secondary | ICD-10-CM | POA: Diagnosis not present

## 2022-01-26 DIAGNOSIS — J449 Chronic obstructive pulmonary disease, unspecified: Secondary | ICD-10-CM | POA: Diagnosis not present

## 2022-01-26 DIAGNOSIS — Z23 Encounter for immunization: Secondary | ICD-10-CM | POA: Diagnosis not present

## 2022-01-26 NOTE — Progress Notes (Signed)
Synopsis: follow up after acute visit, patient of Dr. Chesley Mires.  Assessment & Plan:   #Shortness of breath #COPD #Restrictive Lung Disease #Chronic Hypoxic Respiratory Failure #CKD #CAD  Patient presents for follow up on exertional dyspnea. Lung exam today is clear with no wheeze or rales. POCUS of the lung shows an A-line pattern with no B-lines and no pleural effusions. He was seen by his nephrologist and assessed to be euvolemic and diuretic dose was not changed. Patient reports that his inhalers don't really help with his symptoms, and given lack of wheeze I doubt an acute obstructive process is contributing to his symptoms. Patient's PFT's from 2022 show marked restriction as well as obstruction with a markedly depressed ERV. I suspect his dyspnea is multi-factorial, with contribution from his obstructive lung disease, restrictive lung disease (given previous complex pleural effusion), as well as his obesity. There is likely a component of CAD/HFpEF given history of CAD with quadruple bypass and diastolic dysfunction on TTE. I recommended the patient continue his medications as previously prescribed and counseled him extensively on the need for weight loss.  #Need for immunization against influenza  - Flu Vaccine QUAD High Dose(Fluad)   No follow-ups on file. Follow up with Dr. Halford Chessman as previously scheduled.  I spent 35 minutes caring for this patient today, including preparing to see the patient, obtaining and/or reviewing separately obtained history, performing a medically appropriate examination and/or evaluation, counseling and educating the patient/family/caregiver, ordering medications, tests, or procedures, documenting clinical information in the electronic health record, and independently interpreting results (not separately reported/billed) and communicating results to the patient/family/caregiver  Armando Reichert, MD [facility] 01/26/2022 4:47 PM     End of visit medications:  No orders of the defined types were placed in this encounter.    Current Outpatient Medications:    acetaminophen (TYLENOL) 325 MG tablet, Take 2 tablets (650 mg total) by mouth every 6 (six) hours as needed for mild pain (or Fever >/= 101)., Disp: , Rfl:    albuterol (VENTOLIN HFA) 108 (90 Base) MCG/ACT inhaler, INHALE 2 PUFFS INTO THE LUNGS EVERY 6 HOURS AS NEEDED FOR WHEEZING OR SHORTNESS OF BREATH, Disp: 6.7 g, Rfl: 2   allopurinol (ZYLOPRIM) 100 MG tablet, Take 1 tablet (100 mg total) by mouth daily., Disp: 90 tablet, Rfl: 2   amLODipine (NORVASC) 10 MG tablet, TAKE 1 TABLET(10 MG) BY MOUTH DAILY, Disp: 90 tablet, Rfl: 0   aspirin EC 81 MG tablet, Take 81 mg by mouth daily. Swallow whole., Disp: , Rfl:    Budeson-Glycopyrrol-Formoterol (BREZTRI AEROSPHERE) 160-9-4.8 MCG/ACT AERO, Inhale 2 puffs into the lungs 2 (two) times daily., Disp: 10.7 g, Rfl: 0   Cholecalciferol (VITAMIN D3 PO), Take 4,000 Units by mouth., Disp: , Rfl:    clopidogrel (PLAVIX) 75 MG tablet, TAKE 1 TABLET(75 MG) BY MOUTH DAILY, Disp: 90 tablet, Rfl: 1   Coenzyme Q10 (CO Q-10) 400 MG CAPS, Take 400 mg by mouth daily at 6 (six) AM., Disp: , Rfl:    desoximetasone (TOPICORT) 0.25 % cream, Apply 1 application. topically 2 (two) times daily., Disp: 30 g, Rfl: 5   escitalopram (LEXAPRO) 20 MG tablet, TAKE 1 TABLET(20 MG) BY MOUTH DAILY, Disp: 90 tablet, Rfl: 0   fenofibrate (TRICOR) 145 MG tablet, Take 1 tablet (145 mg total) by mouth daily., Disp: 30 tablet, Rfl: 3   gabapentin (NEURONTIN) 300 MG capsule, Take 1 capsule (300 mg total) by mouth 2 (two) times daily AND 2 capsules (  600 mg total) at bedtime., Disp: 360 capsule, Rfl: 1   glimepiride (AMARYL) 1 MG tablet, TAKE 1 TABLET(1 MG) BY MOUTH DAILY WITH BREAKFAST, Disp: 90 tablet, Rfl: 3   hydrALAZINE (APRESOLINE) 50 MG tablet, TAKE 1 TABLET(50 MG) BY MOUTH EVERY 8 HOURS, Disp: 270 tablet, Rfl: 0   HYDROcodone-acetaminophen  (NORCO/VICODIN) 5-325 MG tablet, Take 1 tablet by mouth every 6 (six) hours as needed for moderate pain., Disp: 30 tablet, Rfl: 0   loratadine (CLARITIN) 10 MG tablet, Take 1 tablet (10 mg total) by mouth daily., Disp: 30 tablet, Rfl: 1   lovastatin (MEVACOR) 40 MG tablet, TAKE 1 TABLET(40 MG) BY MOUTH DAILY, Disp: 90 tablet, Rfl: 1   Omega-3 Fatty Acids (FISH OIL) 1200 MG CAPS, Take 1,200 mg by mouth daily at 6 (six) AM., Disp: , Rfl:    torsemide (DEMADEX) 20 MG tablet, Take 1 tablet (20 mg total) by mouth daily as needed., Disp: 30 tablet, Rfl: 2   traZODone (DESYREL) 150 MG tablet, TAKE 1 TABLET(150 MG) BY MOUTH AT BEDTIME, Disp: 90 tablet, Rfl: 1   nitroGLYCERIN (NITROSTAT) 0.4 MG SL tablet, Place 1 tablet (0.4 mg total) under the tongue every 5 (five) minutes as needed for chest pain., Disp: 25 tablet, Rfl: 1   Subjective:   PATIENT ID: Tony Williams GENDER: male DOB: November 08, 1945, MRN: [id number]  Chief Complaint  Patient presents with   Follow-up    SOB with exertion, dry cough and wheezing.     HPI  Mr. Bieler is a pleasant 76 year old male with a history of COPD, OSA, Restrictive lung disease, CAD and obesity who is presenting to clinic to follow up after a recent sick visit. He was seen by Dr. Patsey Berthold on 12/28/2021 for the chief complaint of shortness of breath. He has since been seen by his nephrologist who adjusted some of his blood pressure medicaations. He continues to be on 20 mg of Torsemide daily.  He reports an exertional dyspnea that appears to be somewhat chronic. He denies an associated cough and does not have any sputum production. He has not had any chest tightness or chest pain. He does not endorse any fevers, chills, night sweats, or weight loss. He has actually gained some weight.  Ancillary information including prior medications, full medical/surgical/family/social histories, and PFTs (when available) are listed below and have been reviewed.   Review of  Systems  Constitutional:  Negative for chills, fever and weight loss.  Respiratory:  Positive for shortness of breath. Negative for cough and sputum production.   Cardiovascular:  Negative for chest pain and palpitations.     Objective:   Vitals:   01/26/22 1500  BP: (!) 146/62  Pulse: 86  Temp: 98 F (36.7 C)  TempSrc: Temporal  SpO2: 90%  Weight: 284 lb 9.6 oz (129.1 kg)  Height: '5\' 10"'$  (1.778 m)   90% on RA BMI Readings from Last 3 Encounters:  01/26/22 40.84 kg/m  12/28/21 40.26 kg/m  12/23/21 40.18 kg/m   Wt Readings from Last 3 Encounters:  01/26/22 284 lb 9.6 oz (129.1 kg)  12/28/21 280 lb 9.6 oz (127.3 kg)  12/23/21 280 lb (127 kg)    Physical Exam Constitutional:      General: He is not in acute distress.    Appearance: He is obese. He is not ill-appearing.  HENT:     Nose: Nose normal.     Comments: Nasal cannula in place    Mouth/Throat:     Mouth:  Mucous membranes are moist.  Eyes:     Pupils: Pupils are equal, round, and reactive to light.  Cardiovascular:     Rate and Rhythm: Normal rate and regular rhythm.     Pulses: Normal pulses.     Heart sounds: Normal heart sounds.  Pulmonary:     Effort: Pulmonary effort is normal.     Breath sounds: Normal breath sounds.  Abdominal:     General: There is distension.     Palpations: Abdomen is soft.  Neurological:     General: No focal deficit present.     Mental Status: He is alert and oriented to person, place, and time. Mental status is at baseline.    POCUS at the bedside  Chest Point of Care Ultrasound  Patient name: Philopateer Strine MRN: [id number] DOB: January 11, 1946  Indication: Dyspnea on exertion  We performed ultrasound of Bilateral Entire hemithoraces for the evaluation of pleural fluid and pulmonary edema..  Relevant sonographic findings:  No amount of pleural fluid noted on isonation. Visualized lung parenchyma was noted to be moving freely with respiration. Anteriorly, there was  an A-line pattern with absence of B-lines on ultrasound.  Impression:  No pleural effusion on ultrasound, A-line pattern, no B-lines noted.      Ancillary Information    Past Medical History:  Diagnosis Date   Bladder cancer ([place]) 2013   CAD (coronary artery disease)    a. 2007 CABG x 4: LIMA->D1, VG->OM1, VG->D2->mLAD; b. 2018 NSTEMI/PCI: DES to VG->OM; c. 10/2020 NSTEMI/PCI: LM 90d, LAD 115m D1 100, LCX 100ost/p, RCA sev diff dzs, VG->D2->mLAD 99/90 before D2 (4.0x15 On[employer] Frontier DES), 90 before mLAD (4.0x15 Onyx Frontier DES), LIMA->D1 ok, VG->OM1 100.   Chronic heart failure with preserved ejection fraction (HFpEF) ([facility]    a. 10/2020 Echo: EF 50-55%, apical HK, GrII DD, mod dil LA; b. 11/2020 Echo: EF 60-65%, no rwma.   Chronic kidney disease    COPD (chronic obstructive pulmonary disease) (HCC)    Depression    Diabetes mellitus without complication (HCC)    Hx of CABG x 4    LIMA-D2, SVG-OM (occluded), SeqSVG-D1-LAD.   Labile hypertension    Myocardial infarction ([facility]      Family History  Problem Relation Age of Onset   Heart Problems Mother    Heart Problems Father      Past Surgical History:  Procedure Laterality Date   CHOLECYSTECTOMY, LAPAROSCOPIC  12/26/1995   COLONOSCOPY WITH PROPOFOL N/A 09/23/2020   Procedure: COLONOSCOPY WITH PROPOFOL;  Surgeon: WLucilla Lame MD;  Location: [facility];  Service: Endoscopy;  Laterality: N/A;   CORONARY ARTERY BYPASS GRAFT     LIMA-D2, SVG-OM, SeqSVG-D1-LAD   CORONARY STENT INTERVENTION N/A 11/12/2020   Procedure: CORONARY STENT INTERVENTION;  Surgeon: AWellington Hampshire MD;  [facility];  Service: Cardiovascular;  Laterality: N/A;   EYE SURGERY     IABP INSERTION N/A 11/11/2020   Procedure: IABP Insertion;  Surgeon: ENelva Bush MD;  [facility];  Service: Cardiovascular;  Laterality: N/A;   IR STENT PLACEMENT ANTE CAROTID INC ANGIO     KNEE SURGERY     RIGHT/LEFT HEART CATH  AND CORONARY/GRAFT ANGIOGRAPHY N/A 11/11/2020   Procedure: RIGHT/LEFT HEART CATH AND CORONARY/GRAFT ANGIOGRAPHY;  Surgeon: ENelva Bush MD;  [facility];  Service: Cardiovascular;  Severe native CAD: 90% dLMCA-100% mLAD/100% ost LCx CTO w/ Severe diffuse RCA disease. Widely patent LIMA-D1. Patent sequential SVG-D2-LAD with 99% mid graft &  90% LIMA-D2 @ anastomosis. previously stented SVG-OM - CTO. Mildly   TRANSURETHRAL RESECTION OF PROSTATE  1994   URINARY SPHINCTER IMPLANT  1995    Social History   Socioeconomic History   Marital status: Married    Spouse name: Not on file   Number of children: 2   Years of education: Not on file   Highest education level: High school graduate  Occupational History   Occupation: retired  Tobacco Use   Smoking status: Former    Packs/day: 1.00    Years: 30.00    Total pack years: 30.00    Types: Cigarettes    Quit date: 10/23/2003    Years since quitting: 18.2   Smokeless tobacco: Never  Vaping Use   Vaping Use: Never used  Substance and Sexual Activity   Alcohol use: Never   Drug use: Never   Sexual activity: Not on file  Other Topics Concern   Not on file  Social History Narrative   Not on file   Social Determinants of Health   Financial Resource Strain: Low Risk  (05/19/2021)   Overall Financial Resource Strain (CARDIA)    Difficulty of Paying Living Expenses: Not hard at all  Food Insecurity: No Food Insecurity (05/19/2021)   Hunger Vital Sign    Worried About Running Out of Food in the Last Year: Never true    [place] in the Last Year: Never true  Transportation Needs: No Transportation Needs (05/19/2021)   PRAPARE - Hydrologist (Medical): No    Lack of Transportation (Non-Medical): No  Physical Activity: Inactive (05/19/2021)   Exercise Vital Sign    Days of Exercise per Week: 0 days    Minutes of Exercise per Session: 0 min  Stress: No Stress Concern Present (05/19/2021)    [place]    Feeling of Stress : Only a little  Recent Concern: Stress - Stress Concern Present (02/26/2021)   [place]    Feeling of Stress : To some extent  Social Connections: Moderately Integrated (05/19/2021)   Social Connection and Isolation Panel [NHANES]    Frequency of Communication with Friends and Family: More than three times a week    Frequency of Social Gatherings with Friends and Family: Twice a week    Attends Religious Services: More than 4 times per year    Active Member of [employer] or Organizations: No    Attends Archivist Meetings: Never    Marital Status: Married  Human resources officer Violence: Not At Risk (05/19/2021)   Humiliation, Afraid, Rape, and Kick questionnaire    Fear of Current or Ex-Partner: No    Emotionally Abused: No    Physically Abused: No    Sexually Abused: No     Allergies  Allergen Reactions   Butorphanol Itching   Morphine Other (See Comments)    Redness around IV site   Pedi-Pre Tape Spray [Wound Dressing Adhesive] Itching   Statins Other (See Comments)    "Destroyed my muscles"   Zocor [Simvastatin] Other (See Comments)    "Destroyed my muscles"     CBC    Component Value Date/Time   WBC 8.5 12/28/2021 1146   RBC 4.89 12/28/2021 1146   HGB 14.2 12/28/2021 1146   HGB 15.6 04/22/2020 1017   HCT 45.0 12/28/2021 1146   HCT 46.9 04/22/2020 1017   PLT 258 12/28/2021 1146  PLT 221 04/22/2020 1017   MCV 92.0 12/28/2021 1146   MCV 90 04/22/2020 1017   MCH 29.0 12/28/2021 1146   MCHC 31.6 12/28/2021 1146   RDW 14.2 12/28/2021 1146   RDW 14.4 04/22/2020 1017   LYMPHSABS 1.2 12/28/2021 1146   LYMPHSABS 1.6 04/22/2020 1017   MONOABS 0.8 12/28/2021 1146   EOSABS 0.4 12/28/2021 1146   EOSABS 0.5 (H) 04/22/2020 1017   BASOSABS 0.1 12/28/2021 1146   BASOSABS 0.1 04/22/2020 1017    Pulmonary  Functions Testing Results:    Latest Ref Rng & Units 01/22/2021   10:37 AM  PFT Results  FVC-Pre L 2.05   FVC-Predicted Pre % 49   FVC-Post L 1.74   FVC-Predicted Post % 42   Pre FEV1/FVC % % 75   Post FEV1/FCV % % 83   FEV1-Pre L 1.54   FEV1-Predicted Pre % 51   FEV1-Post L 1.44   DLCO uncorrected ml/min/mmHg 13.43   DLCO UNC% % 54   DLVA Predicted % 103   TLC L 3.43   TLC % Predicted % 50   RV % Predicted % 72     Outpatient Medications Prior to Visit  Medication Sig Dispense Refill   acetaminophen (TYLENOL) 325 MG tablet Take 2 tablets (650 mg total) by mouth every 6 (six) hours as needed for mild pain (or Fever >/= 101).     albuterol (VENTOLIN HFA) 108 (90 Base) MCG/ACT inhaler INHALE 2 PUFFS INTO THE LUNGS EVERY 6 HOURS AS NEEDED FOR WHEEZING OR SHORTNESS OF BREATH 6.7 g 2   allopurinol (ZYLOPRIM) 100 MG tablet Take 1 tablet (100 mg total) by mouth daily. 90 tablet 2   amLODipine (NORVASC) 10 MG tablet TAKE 1 TABLET(10 MG) BY MOUTH DAILY 90 tablet 0   aspirin EC 81 MG tablet Take 81 mg by mouth daily. Swallow whole.     Budeson-Glycopyrrol-Formoterol (BREZTRI AEROSPHERE) 160-9-4.8 MCG/ACT AERO Inhale 2 puffs into the lungs 2 (two) times daily. 10.7 g 0   Cholecalciferol (VITAMIN D3 PO) Take 4,000 Units by mouth.     clopidogrel (PLAVIX) 75 MG tablet TAKE 1 TABLET(75 MG) BY MOUTH DAILY 90 tablet 1   Coenzyme Q10 (CO Q-10) 400 MG CAPS Take 400 mg by mouth daily at 6 (six) AM.     desoximetasone (TOPICORT) 0.25 % cream Apply 1 application. topically 2 (two) times daily. 30 g 5   escitalopram (LEXAPRO) 20 MG tablet TAKE 1 TABLET(20 MG) BY MOUTH DAILY 90 tablet 0   fenofibrate (TRICOR) 145 MG tablet Take 1 tablet (145 mg total) by mouth daily. 30 tablet 3   gabapentin (NEURONTIN) 300 MG capsule Take 1 capsule (300 mg total) by mouth 2 (two) times daily AND 2 capsules (600 mg total) at bedtime. 360 capsule 1   glimepiride (AMARYL) 1 MG tablet TAKE 1 TABLET(1 MG) BY MOUTH DAILY  WITH BREAKFAST 90 tablet 3   hydrALAZINE (APRESOLINE) 50 MG tablet TAKE 1 TABLET(50 MG) BY MOUTH EVERY 8 HOURS 270 tablet 0   HYDROcodone-acetaminophen (NORCO/VICODIN) 5-325 MG tablet Take 1 tablet by mouth every 6 (six) hours as needed for moderate pain. 30 tablet 0   loratadine (CLARITIN) 10 MG tablet Take 1 tablet (10 mg total) by mouth daily. 30 tablet 1   lovastatin (MEVACOR) 40 MG tablet TAKE 1 TABLET(40 MG) BY MOUTH DAILY 90 tablet 1   Omega-3 Fatty Acids (FISH OIL) 1200 MG CAPS Take 1,200 mg by mouth daily at 6 (six) AM.  torsemide (DEMADEX) 20 MG tablet Take 1 tablet (20 mg total) by mouth daily as needed. 30 tablet 2   traZODone (DESYREL) 150 MG tablet TAKE 1 TABLET(150 MG) BY MOUTH AT BEDTIME 90 tablet 1   nitroGLYCERIN (NITROSTAT) 0.4 MG SL tablet Place 1 tablet (0.4 mg total) under the tongue every 5 (five) minutes as needed for chest pain. 25 tablet 1   No facility-administered medications prior to visit.

## 2022-02-02 NOTE — Progress Notes (Signed)
I,[name],acting as a scribe for [name], MD.,have documented all relevant documentation on the behalf of [name], MD,as directed by  [name], MD while in the presence of [name], MD.    Established patient visit   Patient: Tony Williams   DOB: 1945/08/15   76 y.o. Male  MRN: [id number] Visit Date: 02/08/2022  Today's healthcare provider: [name], MD   Chief Complaint  Patient presents with   Follow-Up chronic disease   Subjective    HPI  Hypertension, follow-up  BP Readings from Last 3 Encounters:  02/08/22 (!) 140/80  01/26/22 (!) 146/62  12/28/21 138/70   Wt Readings from Last 3 Encounters:  02/08/22 281 lb 9.6 oz (127.7 kg)  01/26/22 284 lb 9.6 oz (129.1 kg)  12/28/21 280 lb 9.6 oz (127.3 kg)     He was last seen for hypertension 6 weeks ago.  BP at that visit was 138/70. Management since that visit includes no changes. Continue amlodipine, hydralazine and torsemide.  Patient was seen by Dr. Candiss Norse who increased his hydralazine to 100 mg TID on 01/13/22.   He reports excellent compliance with treatment. He is not having side effects.  He is following a Regular diet. He is not exercising.   Outside blood pressures are 140's/60-70. Symptoms: No chest pain No chest pressure  No palpitations No syncope  No dyspnea No orthopnea  No paroxysmal nocturnal dyspnea No lower extremity edema   Pertinent labs Lab Results  Component Value Date   CHOL 172 12/28/2021   HDL 37 (L) 12/28/2021   LDLCALC 98 12/28/2021   TRIG 184 (H) 12/28/2021   CHOLHDL 4.6 12/28/2021   Lab Results  Component Value Date   NA 141 12/28/2021   K 5.3 (H) 12/28/2021   CREATININE 2.23 (H) 12/28/2021   GFRNONAA 30 (L) 12/28/2021   GLUCOSE 161 (H) 12/28/2021   TSH 2.515 12/08/2020     The ASCVD Risk score (Arnett DK, et al., 2019) failed to calculate for the following reasons:   The patient has a prior MI or stroke  diagnosis  --------------------------------------------------------------------------------------------------- Diabetes Mellitus Type II, Follow-up  Lab Results  Component Value Date   HGBA1C 7.1 (A) 02/08/2022   HGBA1C 7.7 (A) 10/27/2021   HGBA1C 8.5 (H) 08/06/2021   Wt Readings from Last 3 Encounters:  02/08/22 281 lb 9.6 oz (127.7 kg)  01/26/22 284 lb 9.6 oz (129.1 kg)  12/28/21 280 lb 9.6 oz (127.3 kg)   Last seen for diabetes 3 months ago.  Management since then includes no changes. He reports excellent compliance with treatment. He is not having side effects.  Symptoms: No fatigue No foot ulcerations  No appetite changes No nausea  Yes paresthesia of the feet  No polydipsia  No polyuria Yes visual disturbances   No vomiting     Home blood sugar records:  not being checked   Current insulin regiment: none Most Recent Eye Exam: coming up 02/25/2022  Pertinent Labs: Lab Results  Component Value Date   CHOL 172 12/28/2021   HDL 37 (L) 12/28/2021   LDLCALC 98 12/28/2021   TRIG 184 (H) 12/28/2021   CHOLHDL 4.6 12/28/2021   Lab Results  Component Value Date   NA 141 12/28/2021   K 5.3 (H) 12/28/2021   CREATININE 2.23 (H) 12/28/2021   GFRNONAA 30 (L) 12/28/2021   MICROALBUR 100 07/21/2020   LABMICR 266.8 08/06/2021     ---------------------------------------------------------------------------------------------------   Medications: Outpatient Medications Prior to Visit  Medication Sig   acetaminophen (TYLENOL) 325 MG tablet Take 2 tablets (650 mg total) by mouth every 6 (six) hours as needed for mild pain (or Fever >/= 101).   albuterol (VENTOLIN HFA) 108 (90 Base) MCG/ACT inhaler INHALE 2 PUFFS INTO THE LUNGS EVERY 6 HOURS AS NEEDED FOR WHEEZING OR SHORTNESS OF BREATH   allopurinol (ZYLOPRIM) 100 MG tablet Take 1 tablet (100 mg total) by mouth daily.   amLODipine (NORVASC) 10 MG tablet TAKE 1 TABLET(10 MG) BY MOUTH DAILY   aspirin EC 81 MG tablet Take 81 mg  by mouth daily. Swallow whole.   Budeson-Glycopyrrol-Formoterol (BREZTRI AEROSPHERE) 160-9-4.8 MCG/ACT AERO Inhale 2 puffs into the lungs 2 (two) times daily.   Cholecalciferol (VITAMIN D3 PO) Take 4,000 Units by mouth.   clopidogrel (PLAVIX) 75 MG tablet TAKE 1 TABLET(75 MG) BY MOUTH DAILY   Coenzyme Q10 (CO Q-10) 400 MG CAPS Take 400 mg by mouth daily at 6 (six) AM.   desoximetasone (TOPICORT) 0.25 % cream Apply 1 application. topically 2 (two) times daily.   escitalopram (LEXAPRO) 20 MG tablet TAKE 1 TABLET(20 MG) BY MOUTH DAILY   fenofibrate (TRICOR) 145 MG tablet Take 1 tablet (145 mg total) by mouth daily.   glimepiride (AMARYL) 1 MG tablet TAKE 1 TABLET(1 MG) BY MOUTH DAILY WITH BREAKFAST   HYDROcodone-acetaminophen (NORCO/VICODIN) 5-325 MG tablet Take 1 tablet by mouth every 6 (six) hours as needed for moderate pain.   loratadine (CLARITIN) 10 MG tablet Take 1 tablet (10 mg total) by mouth daily.   lovastatin (MEVACOR) 40 MG tablet TAKE 1 TABLET(40 MG) BY MOUTH DAILY   Omega-3 Fatty Acids (FISH OIL) 1200 MG CAPS Take 1,200 mg by mouth daily at 6 (six) AM.   torsemide (DEMADEX) 20 MG tablet Take 1 tablet (20 mg total) by mouth daily as needed.   traZODone (DESYREL) 150 MG tablet TAKE 1 TABLET(150 MG) BY MOUTH AT BEDTIME   [DISCONTINUED] gabapentin (NEURONTIN) 300 MG capsule Take 1 capsule (300 mg total) by mouth 2 (two) times daily AND 2 capsules (600 mg total) at bedtime.   [DISCONTINUED] hydrALAZINE (APRESOLINE) 50 MG tablet TAKE 1 TABLET(50 MG) BY MOUTH EVERY 8 HOURS (Patient taking differently: No sig reported)   nitroGLYCERIN (NITROSTAT) 0.4 MG SL tablet Place 1 tablet (0.4 mg total) under the tongue every 5 (five) minutes as needed for chest pain.   No facility-administered medications prior to visit.    Review of Systems per HPI     Objective    BP (!) 140/80 (BP Location: Left Arm, Patient Position: Sitting, Cuff Size: Large)   Pulse 100   Temp 98.4 F (36.9 C) (Oral)    Resp 16   Ht '5\' 10"'$  (1.778 m)   Wt 281 lb 9.6 oz (127.7 kg)   BMI 40.41 kg/m    Physical Exam Vitals reviewed.  Constitutional:      General: He is not in acute distress.    Appearance: Normal appearance. He is not diaphoretic.  HENT:     Head: Normocephalic and atraumatic.  Eyes:     General: No scleral icterus.    Conjunctiva/sclera: Conjunctivae normal.  Cardiovascular:     Rate and Rhythm: Normal rate and regular rhythm.     Pulses: Normal pulses.     Heart sounds: Normal heart sounds. No murmur heard. Pulmonary:     Effort: Pulmonary effort is normal. No respiratory distress.     Breath sounds: Normal breath sounds. No wheezing or rhonchi.  Musculoskeletal:  Cervical back: Neck supple.     Right lower leg: No edema.     Left lower leg: No edema.  Lymphadenopathy:     Cervical: No cervical adenopathy.  Skin:    General: Skin is warm and dry.     Findings: No rash.  Neurological:     Mental Status: He is alert and oriented to person, place, and time. Mental status is at baseline.  Psychiatric:        Mood and Affect: Mood normal.        Behavior: Behavior normal.       Results for orders placed or performed in visit on 02/08/22  POCT glycosylated hemoglobin (Hb A1C)  Result Value Ref Range   Hemoglobin A1C 7.1 (A) 4.0 - 5.6 %   Est. average glucose Bld gHb Est-mCnc 157     Assessment & Plan     Problem List Items Addressed This Visit       Cardiovascular and Mediastinum   Hypertension associated with diabetes ([place]) - Primary    Improving Continue current medications - hydralazine dose was recently doubled by nephrology Reviewed metabolic panel F/u in 3 months       Relevant Medications   hydrALAZINE (APRESOLINE) 100 MG tablet     Endocrine   Type 2 diabetes mellitus with stage 3a chronic kidney disease, without long-term current use of insulin (HCC)    Chronic with hyperglycemia, but improving Continue glimepiride at current dose Recheck A1c  at next visit Upcoming eye exam UTD on other screenings      Relevant Orders   POCT glycosylated hemoglobin (Hb A1C) (Completed)   Diabetic polyneuropathy associated with type 2 diabetes mellitus (HCC)    Continue gabapentin 900 mg QHS No longer taking earlier doses Well controlled      Relevant Medications   gabapentin (NEURONTIN) 300 MG capsule   Hyperlipidemia associated with type 2 diabetes mellitus ([place])    Previously well controlled Continue statin Repeat FLP and CMP annually Goal LDL < 70      Relevant Medications   hydrALAZINE (APRESOLINE) 100 MG tablet     Other   Morbid obesity ([place])    Discussed importance of healthy weight management Discussed diet and exercise       Other Visit Diagnoses     Controlled type 2 diabetes mellitus with complication, without long-term current use of insulin (HCC)            Return in about 3 months (around 05/11/2022) for chronic disease f/u.      I, [name], MD, have reviewed all documentation for this visit. The documentation on 02/08/22 for the exam, diagnosis, procedures, and orders are all accurate and complete.   Shirleymae Hauth, Dionne Bucy, MD, MPH [facility]

## 2022-02-08 ENCOUNTER — Encounter: Payer: Self-pay | Admitting: Family Medicine

## 2022-02-08 ENCOUNTER — Ambulatory Visit (INDEPENDENT_AMBULATORY_CARE_PROVIDER_SITE_OTHER): Payer: Medicare Other | Admitting: Family Medicine

## 2022-02-08 VITALS — BP 140/80 | HR 100 | Temp 98.4°F | Resp 16 | Ht 70.0 in | Wt 281.6 lb

## 2022-02-08 DIAGNOSIS — N1831 Chronic kidney disease, stage 3a: Secondary | ICD-10-CM

## 2022-02-08 DIAGNOSIS — E1122 Type 2 diabetes mellitus with diabetic chronic kidney disease: Secondary | ICD-10-CM

## 2022-02-08 DIAGNOSIS — E1159 Type 2 diabetes mellitus with other circulatory complications: Secondary | ICD-10-CM

## 2022-02-08 DIAGNOSIS — E1142 Type 2 diabetes mellitus with diabetic polyneuropathy: Secondary | ICD-10-CM | POA: Diagnosis not present

## 2022-02-08 DIAGNOSIS — E1169 Type 2 diabetes mellitus with other specified complication: Secondary | ICD-10-CM

## 2022-02-08 DIAGNOSIS — E118 Type 2 diabetes mellitus with unspecified complications: Secondary | ICD-10-CM

## 2022-02-08 DIAGNOSIS — I152 Hypertension secondary to endocrine disorders: Secondary | ICD-10-CM

## 2022-02-08 DIAGNOSIS — E785 Hyperlipidemia, unspecified: Secondary | ICD-10-CM

## 2022-02-08 LAB — POCT GLYCOSYLATED HEMOGLOBIN (HGB A1C)
Est. average glucose Bld gHb Est-mCnc: 157
Hemoglobin A1C: 7.1 % — AB (ref 4.0–5.6)

## 2022-02-08 MED ORDER — HYDRALAZINE HCL 100 MG PO TABS: 100.0000 mg | ORAL_TABLET | Freq: Three times a day (TID) | ORAL | 1 refills | Status: DC

## 2022-02-08 MED ORDER — GABAPENTIN 300 MG PO CAPS: 900.0000 mg | ORAL_CAPSULE | Freq: Every day | ORAL | 1 refills | Status: DC

## 2022-02-08 NOTE — Assessment & Plan Note (Signed)
Previously well controlled Continue statin Repeat FLP and CMP annually Goal LDL < 70  

## 2022-02-08 NOTE — Assessment & Plan Note (Signed)
Chronic with hyperglycemia, but improving Continue glimepiride at current dose Recheck A1c at next visit Upcoming eye exam UTD on other screenings

## 2022-02-08 NOTE — Assessment & Plan Note (Signed)
Discussed importance of healthy weight management Discussed diet and exercise  

## 2022-02-08 NOTE — Assessment & Plan Note (Signed)
Improving Continue current medications - hydralazine dose was recently doubled by nephrology Reviewed metabolic panel F/u in 3 months

## 2022-02-08 NOTE — Assessment & Plan Note (Signed)
Continue gabapentin 900 mg QHS No longer taking earlier doses Well controlled

## 2022-02-11 ENCOUNTER — Other Ambulatory Visit: Payer: Self-pay | Admitting: Family Medicine

## 2022-02-11 MED ORDER — HYDROCODONE-ACETAMINOPHEN 5-325 MG PO TABS: 1.0000 | ORAL_TABLET | Freq: Four times a day (QID) | ORAL | 0 refills | Status: DC | PRN

## 2022-02-11 NOTE — Telephone Encounter (Signed)
Medication Refill - Medication: HYDROcodone-acetaminophen (NORCO/VICODIN) 5-325 MG tablet   Has the patient contacted their pharmacy? No.   Preferred Pharmacy (with phone number or street name):  [facility] [place] [place] - [address] Phone: [phone]  Fax: [phone]     Has the patient been seen for an appointment in the last year OR does the patient have an upcoming appointment? Yes.    Please assist patient further

## 2022-02-11 NOTE — Telephone Encounter (Signed)
Requested medication (s) are due for refill today: yes  Requested medication (s) are on the active medication list: yes  Last refill:  01/01/22 #30 0 refills  Future visit scheduled: yes in 3 months  Notes to clinic:  not delegated per protocol. Do you want to refill Rx?     Requested Prescriptions  Pending Prescriptions Disp Refills   HYDROcodone-acetaminophen (NORCO/VICODIN) 5-325 MG tablet 30 tablet 0    Sig: Take 1 tablet by mouth every 6 (six) hours as needed for moderate pain.     Not Delegated - Analgesics:  Opioid Agonist Combinations Failed - 02/11/2022  9:45 AM      Failed - This refill cannot be delegated      Failed - Urine Drug Screen completed in last 360 days      Passed - Valid encounter within last 3 months    Recent Outpatient Visits           3 days ago Hypertension associated with diabetes [facility])   [facility] [place], Dionne Bucy, MD   1 month ago Hypertension associated with diabetes [facility])   [facility] [place], [place], PA-C   2 months ago Stage 3b chronic kidney disease (CKD) ([place])   [place], [place], PA-C   3 months ago Hospital discharge follow-up   [place], [place], PA-C   3 months ago Controlled type 2 diabetes mellitus with complication, without long-term current use of insulin [facility])   [facility] [place], [place], PA-C       Future Appointments             In 2 months Agbor-Etang, Aaron Edelman, MD [place]. [place]   In 3 months Bacigalupo, Dionne Bucy, MD [facility], [place]

## 2022-02-14 ENCOUNTER — Other Ambulatory Visit: Payer: Self-pay | Admitting: Family Medicine

## 2022-03-02 DIAGNOSIS — Z961 Presence of intraocular lens: Secondary | ICD-10-CM | POA: Diagnosis not present

## 2022-03-02 DIAGNOSIS — E119 Type 2 diabetes mellitus without complications: Secondary | ICD-10-CM | POA: Diagnosis not present

## 2022-03-02 DIAGNOSIS — H43812 Vitreous degeneration, left eye: Secondary | ICD-10-CM | POA: Diagnosis not present

## 2022-03-02 DIAGNOSIS — H47321 Drusen of optic disc, right eye: Secondary | ICD-10-CM | POA: Diagnosis not present

## 2022-03-02 LAB — HM DIABETES EYE EXAM

## 2022-03-09 ENCOUNTER — Other Ambulatory Visit: Payer: Self-pay | Admitting: Family Medicine

## 2022-03-10 ENCOUNTER — Encounter: Payer: Self-pay | Admitting: *Deleted

## 2022-03-23 ENCOUNTER — Other Ambulatory Visit: Payer: Self-pay | Admitting: Family Medicine

## 2022-03-23 NOTE — Telephone Encounter (Signed)
Medication Refill - Medication: HYDROcodone-acetaminophen (NORCO/VICODIN) 5-325 MG tablet   Has the patient contacted their pharmacy? No. No, more refills.  (Agent: If no, request that the patient contact the pharmacy for the refill. If patient does not wish to contact the pharmacy document the reason why and proceed with request.)   Preferred Pharmacy (with phone number or street name):  [id number] [place], [place] - [address]  [place] [address]  Phone: [phone] Fax: [phone]  Hours: Not open 24 hours   Has the patient been seen for an appointment in the last year OR does the patient have an upcoming appointment? Yes.    Agent: Please be advised that RX refills may take up to 3 business days. We ask that you follow-up with your pharmacy.

## 2022-03-23 NOTE — Telephone Encounter (Signed)
Requested medication (s) are due for refill today:   Provider to review  Requested medication (s) are on the active medication list:   Yes  Future visit scheduled:   Yes   Last ordered: 02/11/2022, #30, 0 refills  Non delegated refill   Requested Prescriptions  Pending Prescriptions Disp Refills   HYDROcodone-acetaminophen (NORCO/VICODIN) 5-325 MG tablet 30 tablet 0    Sig: Take 1 tablet by mouth every 6 (six) hours as needed for moderate pain.     Not Delegated - Analgesics:  Opioid Agonist Combinations Failed - 03/23/2022 10:18 AM      Failed - This refill cannot be delegated      Failed - Urine Drug Screen completed in last 360 days      Passed - Valid encounter within last 3 months    Recent Outpatient Visits           1 month ago Hypertension associated with diabetes [facility])   [facility] [place], Dionne Bucy, MD   3 months ago Hypertension associated with diabetes [facility])   [facility] [place], [place], PA-C   3 months ago Stage 3b chronic kidney disease (CKD) ([place])   [place], [place], PA-C   4 months ago Hospital discharge follow-up   [place], [place], PA-C   4 months ago Controlled type 2 diabetes mellitus with complication, without long-term current use of insulin [facility])   [facility] [place], [place], PA-C       Future Appointments             In 1 month Agbor-Etang, Aaron Edelman, MD [place]. [place]   In 1 month Bacigalupo, Dionne Bucy, MD [facility], [place]

## 2022-03-25 MED ORDER — HYDROCODONE-ACETAMINOPHEN 5-325 MG PO TABS: 1.0000 | ORAL_TABLET | Freq: Four times a day (QID) | ORAL | 0 refills | Status: DC | PRN

## 2022-03-31 DIAGNOSIS — G4733 Obstructive sleep apnea (adult) (pediatric): Secondary | ICD-10-CM | POA: Diagnosis not present

## 2022-04-01 ENCOUNTER — Encounter: Payer: Self-pay | Admitting: Family Medicine

## 2022-04-01 ENCOUNTER — Ambulatory Visit (INDEPENDENT_AMBULATORY_CARE_PROVIDER_SITE_OTHER): Payer: Medicare Other | Admitting: Family Medicine

## 2022-04-01 VITALS — BP 132/67 | HR 51 | Temp 98.7°F | Resp 16 | Wt 282.2 lb

## 2022-04-01 DIAGNOSIS — E1142 Type 2 diabetes mellitus with diabetic polyneuropathy: Secondary | ICD-10-CM | POA: Diagnosis not present

## 2022-04-01 DIAGNOSIS — L089 Local infection of the skin and subcutaneous tissue, unspecified: Secondary | ICD-10-CM | POA: Diagnosis not present

## 2022-04-01 DIAGNOSIS — L723 Sebaceous cyst: Secondary | ICD-10-CM

## 2022-04-01 MED ORDER — DOXYCYCLINE HYCLATE 100 MG PO TABS: 100.0000 mg | ORAL_TABLET | Freq: Two times a day (BID) | ORAL | 0 refills | Status: AC

## 2022-04-01 MED ORDER — PREGABALIN 75 MG PO CAPS: 75.0000 mg | ORAL_CAPSULE | Freq: Two times a day (BID) | ORAL | 2 refills | Status: DC

## 2022-04-01 NOTE — Progress Notes (Signed)
I,Sulibeya S Dimas,acting as a Education administrator for [name], MD.,have documented all relevant documentation on the behalf of [name], MD,as directed by  [name], MD while in the presence of [name], MD.     Established patient visit   Patient: Tony Williams   DOB: 1945-11-12   76 y.o. Male  MRN: [id number] Visit Date: 04/01/2022  Today's healthcare provider: [name], MD   Chief Complaint  Patient presents with   Cyst   Peripheral Neuropathy   Subjective    HPI  Patient C/O knot on upper back between shoulder blades x 2 months. Patient reports knot is painful and thinks it has grown in size. He denies any discharge or color change.   Patient reports gabapentin is not helping with his neuropathy. Patient reports taking 900 mg at bedtime.   Medications: Outpatient Medications Prior to Visit  Medication Sig   acetaminophen (TYLENOL) 325 MG tablet Take 2 tablets (650 mg total) by mouth every 6 (six) hours as needed for mild pain (or Fever >/= 101).   albuterol (VENTOLIN HFA) 108 (90 Base) MCG/ACT inhaler INHALE 2 PUFFS INTO THE LUNGS EVERY 6 HOURS AS NEEDED FOR WHEEZING OR SHORTNESS OF BREATH   allopurinol (ZYLOPRIM) 100 MG tablet Take 1 tablet (100 mg total) by mouth daily.   amLODipine (NORVASC) 10 MG tablet TAKE 1 TABLET(10 MG) BY MOUTH DAILY   aspirin EC 81 MG tablet Take 81 mg by mouth daily. Swallow whole.   Budeson-Glycopyrrol-Formoterol (BREZTRI AEROSPHERE) 160-9-4.8 MCG/ACT AERO Inhale 2 puffs into the lungs 2 (two) times daily.   Cholecalciferol (VITAMIN D3 PO) Take 4,000 Units by mouth.   clopidogrel (PLAVIX) 75 MG tablet TAKE 1 TABLET(75 MG) BY MOUTH DAILY   Coenzyme Q10 (CO Q-10) 400 MG CAPS Take 400 mg by mouth daily at 6 (six) AM.   desoximetasone (TOPICORT) 0.25 % cream Apply 1 application. topically 2 (two) times daily.   escitalopram (LEXAPRO) 20 MG tablet TAKE 1 TABLET(20 MG) BY MOUTH DAILY   fenofibrate (TRICOR) 145 MG  tablet Take 1 tablet (145 mg total) by mouth daily.   glimepiride (AMARYL) 1 MG tablet TAKE 1 TABLET(1 MG) BY MOUTH DAILY WITH BREAKFAST   hydrALAZINE (APRESOLINE) 100 MG tablet Take 1 tablet (100 mg total) by mouth 3 (three) times daily.   HYDROcodone-acetaminophen (NORCO/VICODIN) 5-325 MG tablet Take 1 tablet by mouth every 6 (six) hours as needed for moderate pain.   loratadine (CLARITIN) 10 MG tablet Take 1 tablet (10 mg total) by mouth daily.   lovastatin (MEVACOR) 40 MG tablet TAKE 1 TABLET(40 MG) BY MOUTH DAILY   Omega-3 Fatty Acids (FISH OIL) 1200 MG CAPS Take 1,200 mg by mouth daily at 6 (six) AM.   torsemide (DEMADEX) 20 MG tablet Take 1 tablet (20 mg total) by mouth daily as needed.   traZODone (DESYREL) 150 MG tablet TAKE 1 TABLET(150 MG) BY MOUTH AT BEDTIME   [DISCONTINUED] gabapentin (NEURONTIN) 300 MG capsule Take 3 capsules (900 mg total) by mouth at bedtime.   nitroGLYCERIN (NITROSTAT) 0.4 MG SL tablet Place 1 tablet (0.4 mg total) under the tongue every 5 (five) minutes as needed for chest pain.   No facility-administered medications prior to visit.    Review of Systems  Constitutional:  Positive for fatigue. Negative for appetite change.  Respiratory:  Negative for chest tightness.   Cardiovascular:  Negative for chest pain and leg swelling.  Musculoskeletal:  Positive for myalgias.  Neurological:  Positive for numbness.  Objective    BP 132/67 (BP Location: Left Arm, Patient Position: Sitting, Cuff Size: Large)   Pulse (!) 51   Temp 98.7 F (37.1 C) (Temporal)   Resp 16   Wt 282 lb 3.2 oz (128 kg)   BMI 40.49 kg/m  BP Readings from Last 3 Encounters:  04/01/22 132/67  02/08/22 (!) 140/80  01/26/22 (!) 146/62   Wt Readings from Last 3 Encounters:  04/01/22 282 lb 3.2 oz (128 kg)  02/08/22 281 lb 9.6 oz (127.7 kg)  01/26/22 284 lb 9.6 oz (129.1 kg)      Physical Exam Constitutional:      General: He is not in acute distress.    Appearance:  Normal appearance. He is not diaphoretic.  HENT:     Head: Normocephalic.  Eyes:     Conjunctiva/sclera: Conjunctivae normal.  Pulmonary:     Effort: Pulmonary effort is normal. No respiratory distress.  Skin:    Comments: 3cm raised, erythematous, induration. TTP  Neurological:     Mental Status: He is alert and oriented to person, place, and time. Mental status is at baseline.       No results found for any visits on 04/01/22.  Assessment & Plan     Problem List Items Addressed This Visit       Endocrine   Diabetic polyneuropathy associated with type 2 diabetes mellitus ([place])    Uncontrolled On max dose of gabapentin for his GFR Will switch to Lyrica instead Start with '75mg'$  BID Consider dose titration at f/u visit - max dose of '300mg'$  per day at his current GFR      Relevant Medications   pregabalin (LYRICA) 75 MG capsule   Other Visit Diagnoses     Infected sebaceous cyst    -  Primary      - new problem - no area of fluctuance to drain with I&D today - will treat with Doxy x7d - consider Derm referral (patient states he will make an appointment) for excision  Return if symptoms worsen or fail to improve.      I, [name], MD, have reviewed all documentation for this visit. The documentation on 04/01/22 for the exam, diagnosis, procedures, and orders are all accurate and complete.   Tony Williams, Dionne Bucy, MD, MPH [facility]

## 2022-04-01 NOTE — Assessment & Plan Note (Signed)
Uncontrolled On max dose of gabapentin for his GFR Will switch to Lyrica instead Start with '75mg'$  BID Consider dose titration at f/u visit - max dose of '300mg'$  per day at his current GFR

## 2022-04-05 ENCOUNTER — Other Ambulatory Visit: Payer: Self-pay

## 2022-04-05 DIAGNOSIS — E1169 Type 2 diabetes mellitus with other specified complication: Secondary | ICD-10-CM

## 2022-04-05 DIAGNOSIS — E1142 Type 2 diabetes mellitus with diabetic polyneuropathy: Secondary | ICD-10-CM

## 2022-04-05 DIAGNOSIS — N1831 Chronic kidney disease, stage 3a: Secondary | ICD-10-CM

## 2022-04-05 DIAGNOSIS — I152 Hypertension secondary to endocrine disorders: Secondary | ICD-10-CM

## 2022-04-06 DIAGNOSIS — L02212 Cutaneous abscess of back [any part, except buttock]: Secondary | ICD-10-CM | POA: Diagnosis not present

## 2022-04-17 ENCOUNTER — Other Ambulatory Visit: Payer: Self-pay | Admitting: Physician Assistant

## 2022-04-19 NOTE — Telephone Encounter (Signed)
Requested by interface surescripts. Dose change 02/08/22.  Requested Prescriptions  Refused Prescriptions Disp Refills   hydrALAZINE (APRESOLINE) 50 MG tablet [Pharmacy Med Name: HYDRALAZINE  '50MG'$  TABLETS(ORANGE)] 270 tablet 0    Sig: TAKE 1 TABLET(50 MG) BY MOUTH EVERY 8 HOURS     Cardiovascular:  Vasodilators Failed - 04/17/2022  9:36 AM      Failed - ANA Screen, Ifa, Serum in normal range and within 360 days    No results found for: "ANA", "ANATITER", "LABANTI"       Passed - HCT in normal range and within 360 days    HCT  Date Value Ref Range Status  12/28/2021 45.0 39.0 - 52.0 % Final   Hematocrit  Date Value Ref Range Status  04/22/2020 46.9 37.5 - 51.0 % Final         Passed - HGB in normal range and within 360 days    Hemoglobin  Date Value Ref Range Status  12/28/2021 14.2 13.0 - 17.0 g/dL Final  04/22/2020 15.6 13.0 - 17.7 g/dL Final         Passed - RBC in normal range and within 360 days    RBC  Date Value Ref Range Status  12/28/2021 4.89 4.22 - 5.81 MIL/uL Final         Passed - WBC in normal range and within 360 days    WBC  Date Value Ref Range Status  12/28/2021 8.5 4.0 - 10.5 K/uL Final         Passed - PLT in normal range and within 360 days    Platelets  Date Value Ref Range Status  12/28/2021 258 150 - 400 K/uL Final  04/22/2020 221 150 - 450 x10E3/uL Final         Passed - Last BP in normal range    BP Readings from Last 1 Encounters:  04/01/22 132/67         Passed - Valid encounter within last 12 months    Recent Outpatient Visits           2 weeks ago Infected sebaceous cyst   [place] Crews, MD   2 months ago Hypertension associated with diabetes [facility])   [place], Dionne Bucy, MD   3 months ago Hypertension associated with diabetes [facility])   [place], [place], PA-C   4 months ago Stage 3b chronic kidney disease (CKD)  [facility])   [place], [place], PA-C   5 months ago Hospital discharge follow-up   [facility] [place], Letitia Libra, [place]       Future Appointments             In 1 week Kate Sable, MD [place] at [place]   In 3 weeks Bacigalupo, Dionne Bucy, MD [facility], [place]

## 2022-04-20 DIAGNOSIS — D485 Neoplasm of uncertain behavior of skin: Secondary | ICD-10-CM | POA: Diagnosis not present

## 2022-04-20 DIAGNOSIS — D225 Melanocytic nevi of trunk: Secondary | ICD-10-CM | POA: Diagnosis not present

## 2022-04-20 DIAGNOSIS — L820 Inflamed seborrheic keratosis: Secondary | ICD-10-CM | POA: Diagnosis not present

## 2022-04-20 DIAGNOSIS — L720 Epidermal cyst: Secondary | ICD-10-CM | POA: Diagnosis not present

## 2022-04-21 ENCOUNTER — Other Ambulatory Visit: Payer: Self-pay | Admitting: Family Medicine

## 2022-04-21 ENCOUNTER — Other Ambulatory Visit: Payer: Self-pay | Admitting: *Deleted

## 2022-04-21 DIAGNOSIS — F411 Generalized anxiety disorder: Secondary | ICD-10-CM

## 2022-04-21 MED ORDER — TORSEMIDE 20 MG PO TABS: 20.0000 mg | ORAL_TABLET | Freq: Every day | ORAL | 0 refills | Status: DC | PRN

## 2022-04-23 ENCOUNTER — Other Ambulatory Visit: Payer: Self-pay | Admitting: Family Medicine

## 2022-04-23 NOTE — Telephone Encounter (Signed)
Requested medications are due for refill today.  No  Requested medications are on the active medications list.  yes  Last refill. 03/25/2022 #30  rf  Future visit scheduled.   yes  Notes to clinic.  Refill not delegated.    Requested Prescriptions  Pending Prescriptions Disp Refills   HYDROcodone-acetaminophen (NORCO/VICODIN) 5-325 MG tablet 30 tablet 0    Sig: Take 1 tablet by mouth every 6 (six) hours as needed for moderate pain.     Not Delegated - Analgesics:  Opioid Agonist Combinations Failed - 04/23/2022  1:01 PM      Failed - This refill cannot be delegated      Failed - Urine Drug Screen completed in last 360 days      Passed - Valid encounter within last 3 months    Recent Outpatient Visits           3 weeks ago Infected sebaceous cyst   [place] Crews, MD   2 months ago Hypertension associated with diabetes [facility])   [place], Dionne Bucy, MD   4 months ago Hypertension associated with diabetes [facility] [place] [facility])   [place], [place], PA-C   4 months ago Stage 3b chronic kidney disease (CKD) [facility])   [place], [place], PA-C   5 months ago Hospital discharge follow-up   [facility] [place], [place]ia [place], [place]       Future Appointments             In 1 week Kate Sable, MD [place] at [place]   In 2 weeks Bacigalupo, Dionne Bucy, MD [facility] [place], [place]

## 2022-04-23 NOTE — Telephone Encounter (Signed)
Medication Refill - Medication: HYDROcodone-acetaminophen (NORCO/VICODIN) 5-325 MG tablet   Has the patient contacted their pharmacy? Yes.    (Agent: If yes, when and what did the pharmacy advise?) Contact Provider  Preferred Pharmacy (with phone number or street name): [id number] - [place], [place]   Has the patient been seen for an appointment in the last year OR does the patient have an upcoming appointment? Yes.    Agent: Please be advised that RX refills may take up to 3 business days. We ask that you follow-up with your pharmacy.

## 2022-04-26 MED ORDER — HYDROCODONE-ACETAMINOPHEN 5-325 MG PO TABS: 1.0000 | ORAL_TABLET | Freq: Four times a day (QID) | ORAL | 0 refills | Status: DC | PRN

## 2022-04-30 ENCOUNTER — Encounter: Payer: Self-pay | Admitting: Cardiology

## 2022-04-30 ENCOUNTER — Ambulatory Visit: Payer: Medicare Other | Attending: Cardiology | Admitting: Cardiology

## 2022-04-30 VITALS — BP 154/58 | HR 74 | Wt 283.8 lb

## 2022-04-30 DIAGNOSIS — E7800 Pure hypercholesterolemia, unspecified: Secondary | ICD-10-CM | POA: Diagnosis not present

## 2022-04-30 DIAGNOSIS — I10 Essential (primary) hypertension: Secondary | ICD-10-CM | POA: Diagnosis not present

## 2022-04-30 DIAGNOSIS — I2510 Atherosclerotic heart disease of native coronary artery without angina pectoris: Secondary | ICD-10-CM

## 2022-04-30 MED ORDER — CARVEDILOL 6.25 MG PO TABS: 6.2500 mg | ORAL_TABLET | Freq: Two times a day (BID) | ORAL | 3 refills | Status: DC

## 2022-04-30 NOTE — Progress Notes (Signed)
Cardiology Office Note:    Date:  04/30/2022   ID:  Tony Williams, DOB 06-23-1945, MRN [id number]  PCP:  Virginia Crews, MD   [place]  Cardiologist:  Kate Sable, MD  Advanced Practice Provider:  No care team member to display Electrophysiologist:  None       Referring MD: Virginia Crews, MD   Chief Complaint  Patient presents with   Follow-up    6 month f/u, still having high blood pressure readings 150/60s     History of Present Illness:    Tony Williams is a 77 y.o. male with a hx of CAD/CABG x 4 2007, PCI x 2 2018, PCI x 1 SVG-LAD/Diagonal 2022, former smoker,  COPD, OSA, obesity, CKD, diabetes who presents for follow-up.  Has elevated blood pressure associated.  Recently admitted to the hospital with elevated BP.  Worsening renal function also noticed.  Lisinopril was stopped, hydralazine started.  Renal function stabilized.  Blood pressure at home range in the 150s of late.  Compliant with medications as prescribed.  Adheres to a low-salt diet.  Prior notes Limited echo 11/2020 EF 60 to 65%, indeterminate diastolic function Echo [id number] normal systolic function, indeterminate diastolic function, EF 60 to 65%. Lexiscan Myoview, fixed perfusion defect, no evidence for ischemia. Patient recently moved to the area from [facility].  States having CAD/CABG x4 back in 2007, in 2018 had a myocardial infarction, had 2 stents placed.  He is intolerant to statins, has tried multiple statins and not able to tolerate due to myalgias.  Not tolerant to Zetia.  Currently able to tolerate low-dose lovastatin.    Had sleep study in the past and was diagnosed with OSA, he declines using his CPAP mask.  Does not like the feel for the mask.    Past Medical History:  Diagnosis Date   Bladder cancer ([place]) 2013   CAD (coronary artery disease)    a. 2007 CABG x 4: LIMA->D1, VG->OM1, VG->D2->mLAD; b. 2018 NSTEMI/PCI: DES to VG->OM; c.  10/2020 NSTEMI/PCI: LM 90d, LAD 190m D1 100, LCX 100ost/p, RCA sev diff dzs, VG->D2->mLAD 99/90 before D2 (4.0x15 Onyx Frontier DES), 90 before mLAD (4.0x15 Onyx Frontier DES), LIMA->D1 ok, VG->OM1 100.   Chronic heart failure with preserved ejection fraction (HFpEF) ([facility]    a. 10/2020 Echo: EF 50-55%, apical HK, GrII DD, mod dil LA; b. 11/2020 Echo: EF 60-65%, no rwma.   Chronic kidney disease    COPD (chronic obstructive pulmonary disease) (HCC)    Depression    Diabetes mellitus without complication (HCC)    Hx of CABG x 4    LIMA-D2, SVG-OM (occluded), SeqSVG-D1-LAD.   Labile hypertension    Myocardial infarction ([facility]     Past Surgical History:  Procedure Laterality Date   CHOLECYSTECTOMY, LAPAROSCOPIC  12/26/1995   COLONOSCOPY WITH PROPOFOL N/A 09/23/2020   Procedure: COLONOSCOPY WITH PROPOFOL;  Surgeon: WLucilla Lame MD;  [facility]  Service: Endoscopy;  Laterality: N/A;   CORONARY ARTERY BYPASS GRAFT     LIMA-D2, SVG-OM, SeqSVG-D1-LAD   CORONARY STENT INTERVENTION N/A 11/12/2020   Procedure: CORONARY STENT INTERVENTION;  Surgeon: AWellington Hampshire MD;  [facility];  Service: Cardiovascular;  Laterality: N/A;   EYE SURGERY     IABP INSERTION N/A 11/11/2020   Procedure: IABP Insertion;  Surgeon: ENelva Bush MD;  [facility];  Service: Cardiovascular;  Laterality: N/A;   IR STENT PLACEMENT ANTE CAROTID INC ANGIO  KNEE SURGERY     RIGHT/LEFT HEART CATH AND CORONARY/GRAFT ANGIOGRAPHY N/A 11/11/2020   Procedure: RIGHT/LEFT HEART CATH AND CORONARY/GRAFT ANGIOGRAPHY;  Surgeon: Nelva Bush, MD;  Location: [place] [facility];  Service: Cardiovascular;  Severe native CAD: 90% dLMCA-100% mLAD/100% ost LCx CTO w/ Severe diffuse RCA disease. Widely patent LIMA-D1. Patent sequential SVG-D2-LAD with 99% mid graft & 90% LIMA-D2 @ anastomosis. previously stented SVG-OM - CTO. Mildly   TRANSURETHRAL RESECTION OF PROSTATE  1994    URINARY SPHINCTER IMPLANT  1995    Current Medications: Current Meds  Medication Sig   acetaminophen (TYLENOL) 325 MG tablet Take 2 tablets (650 mg total) by mouth every 6 (six) hours as needed for mild pain (or Fever >/= 101).   albuterol (VENTOLIN HFA) 108 (90 Base) MCG/ACT inhaler INHALE 2 PUFFS INTO THE LUNGS EVERY 6 HOURS AS NEEDED FOR WHEEZING OR SHORTNESS OF BREATH   allopurinol (ZYLOPRIM) 100 MG tablet Take 1 tablet (100 mg total) by mouth daily.   amLODipine (NORVASC) 10 MG tablet TAKE 1 TABLET(10 MG) BY MOUTH DAILY   aspirin EC 81 MG tablet Take 81 mg by mouth daily. Swallow whole.   Budeson-Glycopyrrol-Formoterol (BREZTRI AEROSPHERE) 160-9-4.8 MCG/ACT AERO Inhale 2 puffs into the lungs 2 (two) times daily.   carvedilol (COREG) 6.25 MG tablet Take 1 tablet (6.25 mg total) by mouth 2 (two) times daily.   Cholecalciferol (VITAMIN D3 PO) Take 4,000 Units by mouth.   clopidogrel (PLAVIX) 75 MG tablet TAKE 1 TABLET(75 MG) BY MOUTH DAILY   Coenzyme Q10 (CO Q-10) 400 MG CAPS Take 400 mg by mouth daily at 6 (six) AM.   desoximetasone (TOPICORT) 0.25 % cream Apply 1 application. topically 2 (two) times daily.   escitalopram (LEXAPRO) 20 MG tablet TAKE 1 TABLET(20 MG) BY MOUTH DAILY   fenofibrate (TRICOR) 145 MG tablet Take 1 tablet (145 mg total) by mouth daily.   glimepiride (AMARYL) 1 MG tablet TAKE 1 TABLET(1 MG) BY MOUTH DAILY WITH BREAKFAST   hydrALAZINE (APRESOLINE) 100 MG tablet Take 1 tablet (100 mg total) by mouth 3 (three) times daily.   HYDROcodone-acetaminophen (NORCO/VICODIN) 5-325 MG tablet Take 1 tablet by mouth every 6 (six) hours as needed for moderate pain.   loratadine (CLARITIN) 10 MG tablet Take 1 tablet (10 mg total) by mouth daily.   lovastatin (MEVACOR) 40 MG tablet TAKE 1 TABLET(40 MG) BY MOUTH DAILY   Omega-3 Fatty Acids (FISH OIL) 1200 MG CAPS Take 1,200 mg by mouth daily at 6 (six) AM.   pregabalin (LYRICA) 75 MG capsule Take 1 capsule (75 mg total) by mouth 2  (two) times daily.   torsemide (DEMADEX) 20 MG tablet Take 1 tablet (20 mg total) by mouth daily as needed.   traZODone (DESYREL) 150 MG tablet TAKE 1 TABLET(150 MG) BY MOUTH AT BEDTIME     Allergies:   Dilaudid [hydromorphone], Butorphanol, Morphine, Pedi-pre tape spray [wound dressing adhesive], Statins, and Zocor [simvastatin]   Social History   Socioeconomic History   Marital status: Married    Spouse name: Not on file   Number of children: 2   Years of education: Not on file   Highest education level: High school graduate  Occupational History   Occupation: retired  Tobacco Use   Smoking status: Former    Packs/day: 1.00    Years: 30.00    Total pack years: 30.00    Types: Cigarettes    Quit date: 10/23/2003    Years since quitting: 6.5  Smokeless tobacco: Never  Vaping Use   Vaping Use: Never used  Substance and Sexual Activity   Alcohol use: Never   Drug use: Never   Sexual activity: Not on file  Other Topics Concern   Not on file  Social History Narrative   Not on file   Social Determinants of Health   Financial Resource Strain: Low Risk  (05/19/2021)   Overall Financial Resource Strain (CARDIA)    Difficulty of Paying Living Expenses: Not hard at all  Food Insecurity: No Food Insecurity (05/19/2021)   Hunger Vital Sign    Worried About Running Out of Food in the Last Year: Never true    Ran Out of Food in the Last Year: Never true  Transportation Needs: No Transportation Needs (05/19/2021)   PRAPARE - Hydrologist (Medical): No    Lack of Transportation (Non-Medical): No  Physical Activity: Inactive (05/19/2021)   Exercise Vital Sign    Days of Exercise per Week: 0 days    Minutes of Exercise per Session: 0 min  Stress: No Stress Concern Present (05/19/2021)   [place]    Feeling of Stress : Only a little  Recent Concern: Stress - Stress Concern Present (02/26/2021)    [place]    Feeling of Stress : To some extent  Social Connections: Unknown (05/19/2021)   Social Connection and Isolation Panel [NHANES]    Frequency of Communication with Friends and Family: Not on file    Frequency of Social Gatherings with Friends and Family: Twice a week    Attends Religious Services: More than 4 times per year    Active Member of [employer] or Organizations: No    Attends Archivist Meetings: Never    Marital Status: Married     Family History: The patient's family history includes Heart Problems in his father and mother.  ROS:   Please see the history of present illness.     All other systems reviewed and are negative.  EKGs/Labs/Other Studies Reviewed:    The following studies were reviewed today:   EKG:  EKG is  ordered today.  EKG shows sinus rhythm, heart rate 65  Recent Labs: 09/17/2021: NT-Pro BNP 715 11/12/2021: Magnesium 2.0 12/28/2021: ALT 23; B Natriuretic Peptide 202.9; BUN 35; Creatinine, Ser 2.23; Hemoglobin 14.2; Platelets 258; Potassium 5.3; Sodium 141  Recent Lipid Panel    Component Value Date/Time   CHOL 172 12/28/2021 1146   CHOL 156 07/21/2020 1440   TRIG 184 (H) 12/28/2021 1146   HDL 37 (L) 12/28/2021 1146   HDL 34 (L) 07/21/2020 1440   CHOLHDL 4.6 12/28/2021 1146   VLDL 37 12/28/2021 1146   LDLCALC 98 12/28/2021 1146   LDLCALC 84 07/21/2020 1440     Risk Assessment/Calculations:      Physical Exam:    VS:  BP (!) 154/58 (BP Location: Left Arm, Patient Position: Sitting, Cuff Size: Large)   Pulse 74   Wt 283 lb 12.8 oz (128.7 kg)   SpO2 91% Comment: 2 liters  BMI 40.72 kg/m     Wt Readings from Last 3 Encounters:  04/30/22 283 lb 12.8 oz (128.7 kg)  04/01/22 282 lb 3.2 oz (128 kg)  02/08/22 281 lb 9.6 oz (127.7 kg)     GEN:  Well nourished, well developed in no acute distress HEENT: Normal NECK: No JVD; No carotid bruits CARDIAC: RRR,  no murmurs, rubs, gallops RESPIRATORY: Diminished breath sounds bilaterally, worse on the right lung ABDOMEN: Soft, non-tender, distended MUSCULOSKELETAL:  No edema; No deformity  SKIN: Warm and dry NEUROLOGIC:  Alert and oriented x 3 PSYCHIATRIC:  Normal affect   ASSESSMENT:    1. Coronary artery disease involving native coronary artery of native heart, unspecified whether angina present   2. Primary hypertension   3. Pure hypercholesterolemia    PLAN:    In order of problems listed above:  CAD, CABG x4, PCI x2 (2018).  Recent PCI x2 to SVG 10/2020.  Denies chest pain, continue Plavix, aspirin, lovastatin.  Last echo with EF 60%.   Hypertension, BP elevated.  Start Coreg 6.25 mg twice daily.  Continue hydralazine, Norvasc.  History of worsening renal dysfunction while on lisinopril. Hyperlipidemia, LDL at goal.  Continue lovastatin 40 mg daily, fenofibrate.  Not tolerant to Zetia, other statins or higher doses of lovastatin,.  Follow-up in 2 months.   Medication Adjustments/Labs and Tests Ordered: Current medicines are reviewed at length with the patient today.  Concerns regarding medicines are outlined above.  Orders Placed This Encounter  Procedures   EKG 12-Lead       Meds ordered this encounter  Medications   carvedilol (COREG) 6.25 MG tablet    Sig: Take 1 tablet (6.25 mg total) by mouth 2 (two) times daily.    Dispense:  180 tablet    Refill:  3       Patient Instructions  Medication Instructions:   START Carvedilol - take one tablet ( 6.25 mg) by mouth twice a day.   *If you need a refill on your cardiac medications before your next appointment, please call your pharmacy*   Lab Work:  None Ordered  If you have labs (blood work) drawn today and your tests are completely normal, you will receive your results only by: [place] (if you have MyChart) OR A paper copy in the mail If you have any lab test that is abnormal or we need to change your  treatment, we will call you to review the results.   Testing/Procedures:  None Ordered   Follow-Up: At [facility], you and your health needs are our priority.  As part of our continuing mission to provide you with exceptional heart care, we have created designated Provider Care Teams.  These Care Teams include your primary Cardiologist (physician) and Advanced Practice Providers (APPs -  Physician Assistants and Nurse Practitioners) who all work together to provide you with the care you need, when you need it.  We recommend signing up for the patient portal called "MyChart".  Sign up information is provided on this After Visit Summary.  MyChart is used to connect with patients for Virtual Visits (Telemedicine).  Patients are able to view lab/test results, encounter notes, upcoming appointments, etc.  Non-urgent messages can be sent to your provider as well.   To learn more about what you can do with MyChart, go to [email or web address].    Your next appointment:   2 month(s)  Provider:   You may see Kate Sable, MD or one of the following Advanced Practice Providers on your designated Care Team:   Murray Hodgkins, NP Christell Faith, PA-C Cadence Kathlen Mody, PA-C Gerrie Nordmann, NP   Signed, Kate Sable, MD  04/30/2022 1:54 PM    [place]

## 2022-04-30 NOTE — Patient Instructions (Signed)
Medication Instructions:   START Carvedilol - take one tablet ( 6.25 mg) by mouth twice a day.   *If you need a refill on your cardiac medications before your next appointment, please call your pharmacy*   Lab Work:  None Ordered  If you have labs (blood work) drawn today and your tests are completely normal, you will receive your results only by: [place] (if you have MyChart) OR A paper copy in the mail If you have any lab test that is abnormal or we need to change your treatment, we will call you to review the results.   Testing/Procedures:  None Ordered   Follow-Up: At [facility], you and your health needs are our priority.  As part of our continuing mission to provide you with exceptional heart care, we have created designated Provider Care Teams.  These Care Teams include your primary Cardiologist (physician) and Advanced Practice Providers (APPs -  Physician Assistants and Nurse Practitioners) who all work together to provide you with the care you need, when you need it.  We recommend signing up for the patient portal called "MyChart".  Sign up information is provided on this After Visit Summary.  MyChart is used to connect with patients for Virtual Visits (Telemedicine).  Patients are able to view lab/test results, encounter notes, upcoming appointments, etc.  Non-urgent messages can be sent to your provider as well.   To learn more about what you can do with MyChart, go to [email or web address].    Your next appointment:   2 month(s)  Provider:   You may see Kate Sable, MD or one of the following Advanced Practice Providers on your designated Care Team:   Murray Hodgkins, NP Christell Faith, PA-C Cadence Kathlen Mody, PA-C Gerrie Nordmann, NP

## 2022-05-01 DIAGNOSIS — G4733 Obstructive sleep apnea (adult) (pediatric): Secondary | ICD-10-CM | POA: Diagnosis not present

## 2022-05-10 ENCOUNTER — Telehealth: Payer: Self-pay | Admitting: Family Medicine

## 2022-05-10 NOTE — Telephone Encounter (Signed)
Contacted Tamarcus Droll to schedule their annual wellness visit. Appointment made for 07/06/2022.  [place] Direct Dial: [phone]

## 2022-05-13 ENCOUNTER — Encounter: Payer: Self-pay | Admitting: Family Medicine

## 2022-05-13 ENCOUNTER — Ambulatory Visit (INDEPENDENT_AMBULATORY_CARE_PROVIDER_SITE_OTHER): Payer: Medicare Other | Admitting: Family Medicine

## 2022-05-13 VITALS — BP 150/75 | HR 43 | Resp 16 | Ht 70.0 in | Wt 288.0 lb

## 2022-05-13 DIAGNOSIS — E1159 Type 2 diabetes mellitus with other circulatory complications: Secondary | ICD-10-CM | POA: Diagnosis not present

## 2022-05-13 DIAGNOSIS — E1122 Type 2 diabetes mellitus with diabetic chronic kidney disease: Secondary | ICD-10-CM | POA: Diagnosis not present

## 2022-05-13 DIAGNOSIS — N1831 Chronic kidney disease, stage 3a: Secondary | ICD-10-CM | POA: Diagnosis not present

## 2022-05-13 DIAGNOSIS — E1142 Type 2 diabetes mellitus with diabetic polyneuropathy: Secondary | ICD-10-CM | POA: Diagnosis not present

## 2022-05-13 DIAGNOSIS — E785 Hyperlipidemia, unspecified: Secondary | ICD-10-CM

## 2022-05-13 DIAGNOSIS — E1169 Type 2 diabetes mellitus with other specified complication: Secondary | ICD-10-CM | POA: Diagnosis not present

## 2022-05-13 DIAGNOSIS — I152 Hypertension secondary to endocrine disorders: Secondary | ICD-10-CM

## 2022-05-13 DIAGNOSIS — E118 Type 2 diabetes mellitus with unspecified complications: Secondary | ICD-10-CM

## 2022-05-13 LAB — POCT GLYCOSYLATED HEMOGLOBIN (HGB A1C)
Est. average glucose Bld gHb Est-mCnc: 166
Hemoglobin A1C: 7.4 % — AB (ref 4.0–5.6)

## 2022-05-13 MED ORDER — CARVEDILOL 3.125 MG PO TABS: 3.1250 mg | ORAL_TABLET | Freq: Two times a day (BID) | ORAL | 2 refills | Status: DC

## 2022-05-13 MED ORDER — GLIMEPIRIDE 2 MG PO TABS: 2.0000 mg | ORAL_TABLET | Freq: Every day | ORAL | 3 refills | Status: DC

## 2022-05-13 MED ORDER — PREGABALIN 100 MG PO CAPS: 100.0000 mg | ORAL_CAPSULE | Freq: Two times a day (BID) | ORAL | 3 refills | Status: DC

## 2022-05-13 NOTE — Assessment & Plan Note (Signed)
Uncontrolled but with low HR Need to half coreg dose No other med changes Reviewed last metabolic panel

## 2022-05-13 NOTE — Assessment & Plan Note (Signed)
Previously well controlled Continue statin Repeat FLP and CMP annually Goal LDL < 70

## 2022-05-13 NOTE — Progress Notes (Signed)
I,[name],acting as a scribe for Tony Paganini, MD.,have documented all relevant documentation on the behalf of Tony Paganini, MD,as directed by  Tony Paganini, MD while in the presence of Tony Paganini, MD.   Established patient visit   Patient: Tony Williams   DOB: 02/01/1946   77 y.o. Male  MRN: [id number] Visit Date: 05/13/2022  Today's healthcare provider: [name], MD   Chief Complaint  Patient presents with   follow-up chronic disease   Subjective    HPI  Diabetes Mellitus Type II, Follow-up  Lab Results  Component Value Date   HGBA1C 7.4 (A) 05/13/2022   HGBA1C 7.1 (A) 02/08/2022   HGBA1C 7.7 (A) 10/27/2021   Wt Readings from Last 3 Encounters:  05/13/22 288 lb (130.6 kg)  04/30/22 283 lb 12.8 oz (128.7 kg)  04/01/22 282 lb 3.2 oz (128 kg)   Last seen for diabetes 3 months ago.  Management since then includes Continue glimepiride at current dose  max dose of gabapentin for his GFR Will switch to Lyrica instead Start with '75mg'$  BID Consider dose titration at f/u visit - max dose of '300mg'$ . He reports excellent compliance with treatment. He is not having side effects.  Symptoms: No fatigue No foot ulcerations  No appetite changes No nausea  Yes paresthesia of the feet  No polydipsia  No polyuria No visual disturbances   No vomiting     Home blood sugar records:  not being checked  Pertinent Labs: Lab Results  Component Value Date   CHOL 172 12/28/2021   HDL 37 (L) 12/28/2021   LDLCALC 98 12/28/2021   TRIG 184 (H) 12/28/2021   CHOLHDL 4.6 12/28/2021   Lab Results  Component Value Date   NA 141 12/28/2021   K 5.3 (H) 12/28/2021   CREATININE 2.23 (H) 12/28/2021   GFRNONAA 30 (L) 12/28/2021   LABMICR 266.8 08/06/2021   MICRALBCREAT 356 (H) 08/06/2021     ---------------------------------------------------------------------------------------------------  Hypertension, follow-up  BP Readings from Last 3  Encounters:  05/13/22 (!) 150/75  04/30/22 (!) 154/58  04/01/22 132/67   Wt Readings from Last 3 Encounters:  05/13/22 288 lb (130.6 kg)  04/30/22 283 lb 12.8 oz (128.7 kg)  04/01/22 282 lb 3.2 oz (128 kg)     He was last seen for hypertension 3 months ago.  BP at that visit was 140/80. Management since that visit includes none.Continue current medication hydralazine.  He reports excellent compliance with treatment.  Reports that Cardiology added an extra medicine Carvedilol 6.25 mg Outside blood pressures are 150's/70's Symptoms: No chest pain No chest pressure  No palpitations No syncope  No dyspnea No orthopnea  No paroxysmal nocturnal dyspnea No lower extremity edema   Pertinent labs Lab Results  Component Value Date   CHOL 172 12/28/2021   HDL 37 (L) 12/28/2021   LDLCALC 98 12/28/2021   TRIG 184 (H) 12/28/2021   CHOLHDL 4.6 12/28/2021   Lab Results  Component Value Date   NA 141 12/28/2021   K 5.3 (H) 12/28/2021   CREATININE 2.23 (H) 12/28/2021   GFRNONAA 30 (L) 12/28/2021   GLUCOSE 161 (H) 12/28/2021   TSH 2.515 12/08/2020     The ASCVD Risk score (Arnett DK, et al., 2019) failed to calculate for the following reasons:   The patient has a prior MI or stroke diagnosis  ---------------------------------------------------------------------------------------------------  Medications: Outpatient Medications Prior to Visit  Medication Sig   acetaminophen (TYLENOL) 325 MG tablet Take 2 tablets (650  mg total) by mouth every 6 (six) hours as needed for mild pain (or Fever >/= 101).   albuterol (VENTOLIN HFA) 108 (90 Base) MCG/ACT inhaler INHALE 2 PUFFS INTO THE LUNGS EVERY 6 HOURS AS NEEDED FOR WHEEZING OR SHORTNESS OF BREATH   allopurinol (ZYLOPRIM) 100 MG tablet Take 1 tablet (100 mg total) by mouth daily.   amLODipine (NORVASC) 10 MG tablet TAKE 1 TABLET(10 MG) BY MOUTH DAILY   aspirin EC 81 MG tablet Take 81 mg by mouth daily. Swallow whole.    Budeson-Glycopyrrol-Formoterol (BREZTRI AEROSPHERE) 160-9-4.8 MCG/ACT AERO Inhale 2 puffs into the lungs 2 (two) times daily.   Cholecalciferol (VITAMIN D3 PO) Take 4,000 Units by mouth.   clopidogrel (PLAVIX) 75 MG tablet TAKE 1 TABLET(75 MG) BY MOUTH DAILY   Coenzyme Q10 (CO Q-10) 400 MG CAPS Take 400 mg by mouth daily at 6 (six) AM.   desoximetasone (TOPICORT) 0.25 % cream Apply 1 application. topically 2 (two) times daily.   escitalopram (LEXAPRO) 20 MG tablet TAKE 1 TABLET(20 MG) BY MOUTH DAILY   fenofibrate (TRICOR) 145 MG tablet Take 1 tablet (145 mg total) by mouth daily.   hydrALAZINE (APRESOLINE) 100 MG tablet Take 1 tablet (100 mg total) by mouth 3 (three) times daily.   HYDROcodone-acetaminophen (NORCO/VICODIN) 5-325 MG tablet Take 1 tablet by mouth every 6 (six) hours as needed for moderate pain.   loratadine (CLARITIN) 10 MG tablet Take 1 tablet (10 mg total) by mouth daily.   lovastatin (MEVACOR) 40 MG tablet TAKE 1 TABLET(40 MG) BY MOUTH DAILY   nitroGLYCERIN (NITROSTAT) 0.4 MG SL tablet Place 1 tablet (0.4 mg total) under the tongue every 5 (five) minutes as needed for chest pain.   Omega-3 Fatty Acids (FISH OIL) 1200 MG CAPS Take 1,200 mg by mouth daily at 6 (six) AM.   torsemide (DEMADEX) 20 MG tablet Take 1 tablet (20 mg total) by mouth daily as needed.   traZODone (DESYREL) 150 MG tablet TAKE 1 TABLET(150 MG) BY MOUTH AT BEDTIME   [DISCONTINUED] carvedilol (COREG) 6.25 MG tablet Take 1 tablet (6.25 mg total) by mouth 2 (two) times daily.   [DISCONTINUED] glimepiride (AMARYL) 1 MG tablet TAKE 1 TABLET(1 MG) BY MOUTH DAILY WITH BREAKFAST   [DISCONTINUED] pregabalin (LYRICA) 75 MG capsule Take 1 capsule (75 mg total) by mouth 2 (two) times daily.   No facility-administered medications prior to visit.    Review of Systems per HPI     Objective    BP (!) 150/75 (BP Location: Left Arm, Patient Position: Sitting, Cuff Size: Large)   Pulse (!) 43   Resp 16   Ht '5\' 10"'$   (1.778 m)   Wt 288 lb (130.6 kg)   BMI 41.32 kg/m    Physical Exam Vitals reviewed.  Constitutional:      General: He is not in acute distress.    Appearance: Normal appearance. He is not diaphoretic.  HENT:     Head: Normocephalic and atraumatic.  Eyes:     General: No scleral icterus.    Conjunctiva/sclera: Conjunctivae normal.  Cardiovascular:     Rate and Rhythm: Normal rate and regular rhythm.     Pulses: Normal pulses.     Heart sounds: Normal heart sounds. No murmur heard. Pulmonary:     Effort: Pulmonary effort is normal. No respiratory distress.     Breath sounds: Normal breath sounds. No wheezing or rhonchi.  Musculoskeletal:     Cervical back: Neck supple.     Right  lower leg: No edema.     Left lower leg: No edema.  Lymphadenopathy:     Cervical: No cervical adenopathy.  Skin:    General: Skin is warm and dry.     Findings: No rash.  Neurological:     Mental Status: He is alert and oriented to person, place, and time. Mental status is at baseline.  Psychiatric:        Mood and Affect: Mood normal.        Behavior: Behavior normal.       Results for orders placed or performed in visit on 05/13/22  POCT glycosylated hemoglobin (Hb A1C)  Result Value Ref Range   Hemoglobin A1C 7.4 (A) 4.0 - 5.6 %   Est. average glucose Bld gHb Est-mCnc 166     Assessment & Plan     Problem List Items Addressed This Visit       Cardiovascular and Mediastinum   Hypertension associated with diabetes ([place])    Uncontrolled but with low HR Need to half coreg dose No other med changes Reviewed last metabolic panel      Relevant Medications   carvedilol (COREG) 3.125 MG tablet   glimepiride (AMARYL) 2 MG tablet     Endocrine   Type 2 diabetes mellitus with stage 3a chronic kidney disease, without long-term current use of insulin (HCC)    Chronic and uncontrolled Increase glimepiride to 2 mg daily - no other changes Recheck A1c at next visit      Relevant  Medications   glimepiride (AMARYL) 2 MG tablet   Other Relevant Orders   POCT glycosylated hemoglobin (Hb A1C) (Completed)   Diabetic polyneuropathy associated with type 2 diabetes mellitus ([place]) - Primary    Improved but not quite to goal Increase lyrica to '100mg'$  BID Will see if further titration necessary at next visit      Relevant Medications   pregabalin (LYRICA) 100 MG capsule   glimepiride (AMARYL) 2 MG tablet   Other Relevant Orders   POCT glycosylated hemoglobin (Hb A1C) (Completed)   Hyperlipidemia associated with type 2 diabetes mellitus (HCC)    Previously well controlled Continue statin Repeat FLP and CMP annually Goal LDL < 70       Relevant Medications   carvedilol (COREG) 3.125 MG tablet   glimepiride (AMARYL) 2 MG tablet     Genitourinary   Stage 3a chronic kidney disease (HCC)    Chronic and stable Recheck metabolic panel at next visit Avoid nephrotoxic meds       Relevant Medications   glimepiride (AMARYL) 2 MG tablet   Other Visit Diagnoses     Controlled type 2 diabetes mellitus with complication, without long-term current use of insulin (HCC)       Relevant Medications   glimepiride (AMARYL) 2 MG tablet        Return in about 3 months (around 08/11/2022) for CPE.      I, Tony Paganini, MD, have reviewed all documentation for this visit. The documentation on 05/13/22 for the exam, diagnosis, procedures, and orders are all accurate and complete.   Sy Saintjean, Dionne Bucy, MD, MPH [facility]

## 2022-05-13 NOTE — Assessment & Plan Note (Signed)
Chronic and stable Recheck metabolic panel at next visit Avoid nephrotoxic meds

## 2022-05-13 NOTE — Assessment & Plan Note (Signed)
Chronic and uncontrolled Increase glimepiride to 2 mg daily - no other changes Recheck A1c at next visit

## 2022-05-13 NOTE — Assessment & Plan Note (Signed)
Improved but not quite to goal Increase lyrica to '100mg'$  BID Will see if further titration necessary at next visit

## 2022-05-19 DIAGNOSIS — R600 Localized edema: Secondary | ICD-10-CM | POA: Diagnosis not present

## 2022-05-19 DIAGNOSIS — N1832 Chronic kidney disease, stage 3b: Secondary | ICD-10-CM | POA: Diagnosis not present

## 2022-05-19 DIAGNOSIS — E1122 Type 2 diabetes mellitus with diabetic chronic kidney disease: Secondary | ICD-10-CM | POA: Diagnosis not present

## 2022-05-19 DIAGNOSIS — I10 Essential (primary) hypertension: Secondary | ICD-10-CM | POA: Diagnosis not present

## 2022-05-21 ENCOUNTER — Other Ambulatory Visit: Payer: Self-pay

## 2022-05-21 MED ORDER — TORSEMIDE 20 MG PO TABS: 20.0000 mg | ORAL_TABLET | Freq: Every day | ORAL | 1 refills | Status: DC | PRN

## 2022-05-24 DIAGNOSIS — G4733 Obstructive sleep apnea (adult) (pediatric): Secondary | ICD-10-CM | POA: Diagnosis not present

## 2022-05-25 ENCOUNTER — Other Ambulatory Visit: Payer: Self-pay | Admitting: Family Medicine

## 2022-05-25 NOTE — Telephone Encounter (Signed)
Requested medication (s) are due for refill today: yes  Requested medication (s) are on the active medication list: yes  Last refill:  04/26/22  Future visit scheduled: yes  Notes to clinic:  Unable to refill per protocol, cannot delegate.      Requested Prescriptions  Pending Prescriptions Disp Refills   HYDROcodone-acetaminophen (NORCO/VICODIN) 5-325 MG tablet 30 tablet 0    Sig: Take 1 tablet by mouth every 6 (six) hours as needed for moderate pain.     Not Delegated - Analgesics:  Opioid Agonist Combinations Failed - 05/25/2022  1:01 PM      Failed - This refill cannot be delegated      Failed - Urine Drug Screen completed in last 360 days      Passed - Valid encounter within last 3 months    Recent Outpatient Visits           1 week ago Diabetic polyneuropathy associated with type 2 diabetes mellitus [facility])   [place], Dionne Bucy, MD   1 month ago Infected sebaceous cyst   [place] Crews, MD   3 months ago Hypertension associated with diabetes [facility])   [place], Dionne Bucy, MD   5 months ago Hypertension associated with diabetes [facility])   [place], [employer], PA-C   6 months ago Stage 3b chronic kidney disease (CKD) [facility])   [place], [place], PA-C       Future Appointments             In 1 month Fur[place], Cadence H, PA-C [place] [facility] [place]   In 2 months [place], Dionne Bucy, MD [facility], [place]

## 2022-05-25 NOTE — Telephone Encounter (Signed)
Medication Refill - Medication: HYDROcodone-acetaminophen (NORCO/VICODIN) 5-325 MG tablet   Has the patient contacted their pharmacy? No because of controlled substance (Agent: If no, request that the patient contact the pharmacy for the refill. If patient does not wish to contact the pharmacy document the reason why and proceed with request.) (Agent: If yes, when and what did the pharmacy advise?)  Preferred Pharmacy (with phone number or street name):  [facility] [place] - [address] Phone: [phone]  Fax: [phone]     Has the patient been seen for an appointment in the last year OR does the patient have an upcoming appointment? yes  Agent: Please be advised that RX refills may take up to 3 business days. We ask that you follow-up with your pharmacy.

## 2022-05-26 NOTE — Telephone Encounter (Signed)
Last OV : 05/13/22  Next OV: 08/10/22 Last refill: 04/26/22

## 2022-05-27 MED ORDER — HYDROCODONE-ACETAMINOPHEN 5-325 MG PO TABS: 1.0000 | ORAL_TABLET | Freq: Four times a day (QID) | ORAL | 0 refills | Status: DC | PRN

## 2022-05-30 DIAGNOSIS — G4733 Obstructive sleep apnea (adult) (pediatric): Secondary | ICD-10-CM | POA: Diagnosis not present

## 2022-05-31 ENCOUNTER — Encounter: Payer: Self-pay | Admitting: Family Medicine

## 2022-05-31 ENCOUNTER — Ambulatory Visit (INDEPENDENT_AMBULATORY_CARE_PROVIDER_SITE_OTHER): Payer: Medicare Other | Admitting: Family Medicine

## 2022-05-31 VITALS — BP 150/74 | HR 54 | Temp 98.1°F | Resp 24 | Wt 289.0 lb

## 2022-05-31 DIAGNOSIS — N1831 Chronic kidney disease, stage 3a: Secondary | ICD-10-CM | POA: Diagnosis not present

## 2022-05-31 DIAGNOSIS — I152 Hypertension secondary to endocrine disorders: Secondary | ICD-10-CM

## 2022-05-31 DIAGNOSIS — M10372 Gout due to renal impairment, left ankle and foot: Secondary | ICD-10-CM | POA: Diagnosis not present

## 2022-05-31 DIAGNOSIS — E1159 Type 2 diabetes mellitus with other circulatory complications: Secondary | ICD-10-CM

## 2022-05-31 DIAGNOSIS — E785 Hyperlipidemia, unspecified: Secondary | ICD-10-CM

## 2022-05-31 DIAGNOSIS — E1122 Type 2 diabetes mellitus with diabetic chronic kidney disease: Secondary | ICD-10-CM | POA: Diagnosis not present

## 2022-05-31 DIAGNOSIS — E1169 Type 2 diabetes mellitus with other specified complication: Secondary | ICD-10-CM

## 2022-05-31 DIAGNOSIS — E1142 Type 2 diabetes mellitus with diabetic polyneuropathy: Secondary | ICD-10-CM

## 2022-05-31 MED ORDER — OXYCODONE-ACETAMINOPHEN 10-325 MG PO TABS: 1.0000 | ORAL_TABLET | Freq: Three times a day (TID) | ORAL | 0 refills | Status: AC | PRN

## 2022-05-31 MED ORDER — COLCHICINE 0.6 MG PO TABS: 0.6000 mg | ORAL_TABLET | Freq: Two times a day (BID) | ORAL | 0 refills | Status: DC

## 2022-05-31 NOTE — Assessment & Plan Note (Signed)
Acute, self limiting Unable to increase allopurinol in setting of reduced eGFR 5 day course of oxy and colchicine to assist Advised to not use his previous norco or apap at this time

## 2022-05-31 NOTE — Assessment & Plan Note (Signed)
Chronic, elevated Reports recent decrease in coreg dosing by PCP iso SB

## 2022-05-31 NOTE — Assessment & Plan Note (Signed)
Chronic, stable Remains on lyrica now at 100 mg BID

## 2022-05-31 NOTE — Assessment & Plan Note (Signed)
Chronic, elevated at 7.2% Goal <7% A1c Remains on glimepiride 2 mg daily

## 2022-05-31 NOTE — Assessment & Plan Note (Signed)
Chronic, remains elevated LDL goal of 55 On Omega 3s and fenofibrate as well as lovastatin 40

## 2022-05-31 NOTE — Progress Notes (Signed)
I,Tony Williams,acting as a Education administrator for Tony Sprout, FNP.,have documented all relevant documentation on the behalf of Tony Sprout, FNP,as directed by  Tony Sprout, FNP while in the presence of Tony Sprout, FNP.   Established patient visit  Patient: Tony Williams   DOB: 02/07/1946   77 y.o. Male  MRN: [id number] Visit Date: 05/31/2022  Today's healthcare provider: Gwyneth Sprout, FNP  Introduced to nurse practitioner role and practice setting.  All questions answered.  Discussed provider/patient relationship and expectations.  Chief Complaint  Patient presents with   Gout   Subjective    HPI  Patient C/O right foot pain top area and big toe since last Wednesday. He reports pain is worse today. He reports swelling. He reports taking allopurinol 100 mg daily. He reports that he was on 300 mg for several years. However, his dose was changed about 3 months ago.   Medications: Outpatient Medications Prior to Visit  Medication Sig   acetaminophen (TYLENOL) 325 MG tablet Take 2 tablets (650 mg total) by mouth every 6 (six) hours as needed for mild pain (or Fever >/= 101).   albuterol (VENTOLIN HFA) 108 (90 Base) MCG/ACT inhaler INHALE 2 PUFFS INTO THE LUNGS EVERY 6 HOURS AS NEEDED FOR WHEEZING OR SHORTNESS OF BREATH   allopurinol (ZYLOPRIM) 100 MG tablet Take 1 tablet (100 mg total) by mouth daily.   amLODipine (NORVASC) 10 MG tablet TAKE 1 TABLET(10 MG) BY MOUTH DAILY   aspirin EC 81 MG tablet Take 81 mg by mouth daily. Swallow whole.   Budeson-Glycopyrrol-Formoterol (BREZTRI AEROSPHERE) 160-9-4.8 MCG/ACT AERO Inhale 2 puffs into the lungs 2 (two) times daily.   carvedilol (COREG) 3.125 MG tablet Take 1 tablet (3.125 mg total) by mouth 2 (two) times daily.   Cholecalciferol (VITAMIN D3 PO) Take 4,000 Units by mouth.   clopidogrel (PLAVIX) 75 MG tablet TAKE 1 TABLET(75 MG) BY MOUTH DAILY   Coenzyme Q10 (CO Q-10) 400 MG CAPS Take 400 mg by mouth daily at 6 (six) AM.    desoximetasone (TOPICORT) 0.25 % cream Apply 1 application. topically 2 (two) times daily.   escitalopram (LEXAPRO) 20 MG tablet TAKE 1 TABLET(20 MG) BY MOUTH DAILY   fenofibrate (TRICOR) 145 MG tablet Take 1 tablet (145 mg total) by mouth daily.   glimepiride (AMARYL) 2 MG tablet Take 1 tablet (2 mg total) by mouth daily with breakfast. TAKE 1 TABLET(1 MG) BY MOUTH DAILY WITH BREAKFAST   hydrALAZINE (APRESOLINE) 100 MG tablet Take 1 tablet (100 mg total) by mouth 3 (three) times daily.   HYDROcodone-acetaminophen (NORCO/VICODIN) 5-325 MG tablet Take 1 tablet by mouth every 6 (six) hours as needed for moderate pain.   loratadine (CLARITIN) 10 MG tablet Take 1 tablet (10 mg total) by mouth daily.   lovastatin (MEVACOR) 40 MG tablet TAKE 1 TABLET(40 MG) BY MOUTH DAILY   Omega-3 Fatty Acids (FISH OIL) 1200 MG CAPS Take 1,200 mg by mouth daily at 6 (six) AM.   pregabalin (LYRICA) 100 MG capsule Take 1 capsule (100 mg total) by mouth 2 (two) times daily.   torsemide (DEMADEX) 20 MG tablet Take 1 tablet (20 mg total) by mouth daily as needed.   traZODone (DESYREL) 150 MG tablet TAKE 1 TABLET(150 MG) BY MOUTH AT BEDTIME   nitroGLYCERIN (NITROSTAT) 0.4 MG SL tablet Place 1 tablet (0.4 mg total) under the tongue every 5 (five) minutes as needed for chest pain.   No facility-administered medications prior to visit.  Review of Systems  Constitutional:  Negative for chills and fever.  Respiratory:  Positive for shortness of breath. Negative for cough.   Cardiovascular:  Negative for chest pain.  Musculoskeletal:  Positive for myalgias.     Objective    BP (!) 150/74 (BP Location: Left Arm, Patient Position: Sitting, Cuff Size: Large)   Pulse (!) 54   Temp 98.1 F (36.7 C) (Temporal)   Resp (!) 24   Wt 289 lb (131.1 kg)   SpO2 93% Comment: w/o oxygen  BMI 41.47 kg/m   Physical Exam Vitals and nursing note reviewed.  Constitutional:      Appearance: Normal appearance. He is obese.  HENT:      Head: Normocephalic and atraumatic.  Cardiovascular:     Rate and Rhythm: Normal rate and regular rhythm.     Pulses: Normal pulses.     Heart sounds: Normal heart sounds.  Pulmonary:     Effort: Pulmonary effort is normal.     Breath sounds: Normal breath sounds.  Musculoskeletal:        General: Normal range of motion.     Cervical back: Normal range of motion.       Feet:  Feet:     Right foot:     Skin integrity: Erythema and warmth present.  Skin:    General: Skin is warm and dry.     Capillary Refill: Capillary refill takes less than 2 seconds.  Neurological:     General: No focal deficit present.     Mental Status: He is alert and oriented to person, place, and time. Mental status is at baseline.  Psychiatric:        Mood and Affect: Mood normal.        Behavior: Behavior normal.        Thought Content: Thought content normal.        Judgment: Judgment normal.      No results found for any visits on 05/31/22.  Assessment & Plan     Problem List Items Addressed This Visit       Cardiovascular and Mediastinum   Hypertension associated with diabetes ([place])    Chronic, elevated Reports recent decrease in coreg dosing by PCP iso SB      Relevant Orders   Refer to [facility] ([id number])   Refer to Pharmacy Medication Management [REF2016]     Endocrine   Diabetic polyneuropathy associated with type 2 diabetes mellitus ([place])    Chronic, stable Remains on lyrica now at 100 mg BID      Relevant Orders   Refer to [facility] ([id number])   Refer to Pharmacy Medication Management [REF2016]   Hyperlipidemia associated with type 2 diabetes mellitus ([place])    Chronic, remains elevated LDL goal of 55 On Omega 3s and fenofibrate as well as lovastatin 40      Relevant Orders   Refer to [facility] ([id number])   Refer to Pharmacy Medication Management [REF2016]   Type 2 diabetes mellitus with stage 3a chronic kidney  disease, without long-term current use of insulin (HCC)    Chronic, elevated at 7.2% Goal <7% A1c Remains on glimepiride 2 mg daily       Relevant Orders   Refer to [facility] ([id number])   Refer to Pharmacy Medication Management [REF2016]     Musculoskeletal and Integument   Acute gout due to renal impairment involving toe of left foot - Primary    Acute, self  limiting Unable to increase allopurinol in setting of reduced eGFR 5 day course of oxy and colchicine to assist Advised to not use his previous norco or apap at this time       Relevant Medications   oxyCODONE-acetaminophen (PERCOCET) 10-325 MG tablet   colchicine 0.6 MG tablet   Other Relevant Orders   Refer to [facility] [id number])   Refer to Pharmacy Medication Management [REF2016]   Return if symptoms worsen or fail to improve.     Vonna Kotyk, FNP, have reviewed all documentation for this visit. The documentation on 05/31/22 for the exam, diagnosis, procedures, and orders are all accurate and complete.  Tony Williams, [place] [phone] (phone) [phone] (fax)  [place]

## 2022-06-08 ENCOUNTER — Other Ambulatory Visit: Payer: Self-pay | Admitting: Family Medicine

## 2022-06-08 ENCOUNTER — Ambulatory Visit: Payer: Self-pay | Admitting: *Deleted

## 2022-06-08 DIAGNOSIS — M10372 Gout due to renal impairment, left ankle and foot: Secondary | ICD-10-CM

## 2022-06-08 DIAGNOSIS — E1142 Type 2 diabetes mellitus with diabetic polyneuropathy: Secondary | ICD-10-CM

## 2022-06-08 MED ORDER — PREDNISONE 20 MG PO TABS: 20.0000 mg | ORAL_TABLET | Freq: Every day | ORAL | 0 refills | Status: DC

## 2022-06-08 NOTE — Telephone Encounter (Signed)
Message from Luciana Axe sent at 06/08/2022  8:36 AM EDT  Summary: Gout returned after take colchicine from [facility] 05/31/22 In need of Advice   Pt is calling to report he was seen in office 05/31/22 for Acute gout due to renal impairment involving toe of left foot. Pt stop the 5 days of colchicine 0.6 MG tablet [id number] and he left foot the gout has returned. Please advise          Call History   Type Contact Phone/Fax User  06/08/2022 08:33 AM EDT Phone (Incoming) Williams, Tony [place]" (Self) [phone] Lemmie Evens) [place] A   Reason for Disposition  [1] Diabetes mellitus AND [2] small cut (scratch) or abrasion (scrape)    Has gout in right big toe.   Out of colchicine so gout is flaring up again.   Seen for this 05/31/2022.  Answer Assessment - Initial Assessment Questions 1. MECHANISM: "How did the injury happen?"      I have gout in my right big toe.    I was seen 05/31/2022 and was given colchicine and oxycodone.   When the colchicine ran out the gout started again.   The colchicine was only for 5 days.   The gout was getting better.  I'm out of my pain medicine too the oxycodone.    2. ONSET: "When did the injury happen?" (Minutes or hours ago)      Has gout that is flared back up after completing the 5 days of colchicine. 3. LOCATION: "What part of the toe is injured?" "Is the nail damaged?"      N/A 4. APPEARANCE of TOE INJURY: "What does the injury look like?"      Right big toe 5. SEVERITY: "Can you use the foot normally?" "Can you walk?"      It's hurting again 6. SIZE: For cuts, bruises, or swelling, ask: "How large is it?" (e.g., inches or centimeters;  entire toe)      N/A 7. PAIN: "Is there pain?" If Yes, ask: "How bad is the pain?"   (e.g., Scale 1-10; or mild, moderate, severe)     Yes from the gout 8. TETANUS: For any breaks in the skin, ask: "When was the last tetanus booster?"     N/A 9. DIABETES: "Do you have a history of diabetes or poor circulation in  the feet?"     Yes and kidney disease 10. OTHER SYMPTOMS: "Do you have any other symptoms?"        No 11. PREGNANCY: "Is there any chance you are pregnant?" "When was your last menstrual period?"       N/A  Protocols used: Toe Injury-A-AH

## 2022-06-08 NOTE — Telephone Encounter (Signed)
Advised 

## 2022-06-08 NOTE — Telephone Encounter (Signed)
  Chief Complaint: Gout flaring back up after completing the 5 days of colchicine.   Saw Tally Joe on 05/31/2022 for this.    He is also out of his oxycodone she prescribed for the gout pain. Symptoms: Right big toe gout is flaring up again, painful.   Was doing better on the colchicine.  Only given 5 days worth. Frequency: N/A Pertinent Negatives: Patient denies N/A Disposition: [] ED /[] Urgent Care (no appt availability in office) / [] Appointment(In office/virtual)/ []  [place] [facility]/ [] Home Care/ [] Refused Recommended Disposition /[] [place] [facility]/ [x]  Follow-up with PCP Additional Notes: Message sent to Tally Joe.  Pt. Agreeable to someone calling him back.     He mentioned he was on allopurinol 300 mg daily but then his dose was decreased to 100 mg daily.   His gout was under control on the 300 mg.     He has one pill left of the oxycodone and is requesting a refill of it.

## 2022-06-09 ENCOUNTER — Telehealth: Payer: Self-pay

## 2022-06-09 NOTE — Progress Notes (Unsigned)
   Care Guide Note  06/09/2022 Name: Tony Williams MRN: [id number] DOB: [birth year]  Referred by: [place] Crews, MD Reason for referral : Care Coordination (Outreach to schedule with pharm d )   [name] is a 77 y.o. year old male who is a primary care patient of Brita Romp, Dionne Bucy, MD. [name] was referred to the pharmacist for assistance related to HTN, HLD, and DM.    An unsuccessful telephone outreach was attempted today to contact the patient who was referred to the pharmacy team for assistance with medication management. Additional attempts will be made to contact the patient.   Noreene Larsson, [place], [place] [address] Direct Dial: [phone] [email or web address]

## 2022-06-10 NOTE — Telephone Encounter (Signed)
Noted  

## 2022-06-10 NOTE — Progress Notes (Signed)
   Care Guide Note  06/10/2022 Name: Tony Williams MRN: [id number] DOB: [birth year]  Referred by: Virginia Crews, MD Reason for referral : Care Coordination (Outreach to schedule with pharm d )   [name] is a 77 y.o. year old male who is a primary care patient of Brita Romp, Dionne Bucy, MD. Kameel Taitano was referred to the pharmacist for assistance related to HTN.    Successful contact was made with the patient to discuss pharmacy services. Patient declines engagement at this time. Contact information was provided to the patient should they wish to reach out for assistance at a later time.  Noreene Larsson, [place], [place] [address] Direct Dial: [phone] [email or web address]

## 2022-06-14 ENCOUNTER — Other Ambulatory Visit: Payer: Self-pay | Admitting: Family Medicine

## 2022-06-29 ENCOUNTER — Ambulatory Visit: Payer: Medicare Other | Attending: Medical | Admitting: Medical

## 2022-06-29 ENCOUNTER — Encounter: Payer: Self-pay | Admitting: Medical

## 2022-06-29 VITALS — BP 162/76 | HR 62 | Ht 70.0 in | Wt 292.2 lb

## 2022-06-29 DIAGNOSIS — I10 Essential (primary) hypertension: Secondary | ICD-10-CM | POA: Diagnosis not present

## 2022-06-29 DIAGNOSIS — I5032 Chronic diastolic (congestive) heart failure: Secondary | ICD-10-CM

## 2022-06-29 DIAGNOSIS — E782 Mixed hyperlipidemia: Secondary | ICD-10-CM

## 2022-06-29 DIAGNOSIS — I25709 Atherosclerosis of coronary artery bypass graft(s), unspecified, with unspecified angina pectoris: Secondary | ICD-10-CM | POA: Diagnosis not present

## 2022-06-29 MED ORDER — ISOSORBIDE MONONITRATE ER 30 MG PO TB24: 30.0000 mg | ORAL_TABLET | Freq: Every day | ORAL | 3 refills | Status: DC

## 2022-06-29 NOTE — Progress Notes (Signed)
Cardiology Office Note:    Date:  06/29/2022   ID:  Tony Williams, DOB April 23, 1945, MRN [id number]  PCP:  Tony Downer, MD  [facility] Cardiologist:  Debbe Odea, MD  [facility] Electrophysiologist:  None   Referring MD: Tony Downer, MD   Chief Complaint: 2 month follow-up  History of Present Illness:    Tony Williams is a 77 y.o. male with a hx of CAD/CABG x 4 2007, PCI x 2 2018, PCI x 1 SVG-LAD/Diagonal 2022, diastolic heart failure, former smoker,  COPD, OSA, obesity, CKD, diabetes who presents for follow-up.   He underwent CABG in [place]. In 2018, he suffered a NSTEMI and required stenting of the vein graft to the obtuse marginal. He subsequently moved to [place] and established care with the practice. In March 2022 he was evaluated for chest discomfort and underwent stress testing which showed basal inferior, basal inferolateral, mid inferior, and mid inferolateral defect consistent with infarct and moderate peri-infarct ischemia. EF was 45%. This was followed by an echocardiogram which showed an EF 60-65% without WMA. He was medically managed. In August 2022, he was admitted with angina and ruled in for NSTEMI. Echo showed EF 50-55% with apical HK and G2DD. He was volume overloaded and required diuresis with subsequent development of mid AKI. He underwent diagnostic cath revealing an occlusion of the previously stented vein graft to the obtuse marginal as well as severe disease in the quintal vein graft to the D1 and mid LAD with stenosis prior to insertion of both vessels. He required placement of intra-aortic balloon pump was transferred to [facility] for high risk PCI, which was performed the following day with 2 stents placed in the sequential vein graft. During admission, he was noted to have intermittent bradycardia in 2-1 AV block. BB was discontinued.   In August 2023 the patient was admitted for NSTEMI and hypertensive urgency, HS troponin  up to 71. Chest pain with typical and atypical features. Stress test showed fixed defect and no significant ischemia. Not a good cath candidate due to renal failure. He was discharged on Aspirin, Effient, and pravastatin.  Patient was later changed to lovastatin.  Patient was last seen 04/30/2022 for elevated blood pressure.  He was started on Coreg twice a day.  Today, the patient feels overall OK. He reports occasional chest pain that is more atypical. He reports a deep soreness in the right side of the chest, worse with a deep breath. Its a sharp pain. He has worse shortness of breath and is going to see pulmonary. He has O2 but doesn't use it as he should. O2 is 91% today. I recommended he use his O2 24/7.   Past Medical History:  Diagnosis Date   Bladder cancer 2013   CAD (coronary artery disease)    a. 2007 CABG x 4: LIMA->D1, VG->OM1, VG->D2->mLAD; b. 2018 NSTEMI/PCI: DES to VG->OM; c. 10/2020 NSTEMI/PCI: LM 90d, LAD 146m, D1 100, LCX 100ost/p, RCA sev diff dzs, VG->D2->mLAD 99/90 before D2 (4.0x15 Onyx Frontier DES), 90 before mLAD (4.0x15 Onyx Frontier DES), LIMA->D1 ok, VG->OM1 100.   Chronic heart failure with preserved ejection fraction (HFpEF)    a. 10/2020 Echo: EF 50-55%, apical HK, GrII DD, mod dil LA; b. 11/2020 Echo: EF 60-65%, no rwma.   Chronic kidney disease    COPD (chronic obstructive pulmonary disease)    Depression    Diabetes mellitus without complication    Hx of CABG x 4    LIMA-D2, SVG-OM (  occluded), SeqSVG-D1-LAD.   Labile hypertension    Myocardial infarction     Past Surgical History:  Procedure Laterality Date   CHOLECYSTECTOMY, LAPAROSCOPIC  12/26/1995   COLONOSCOPY WITH PROPOFOL N/A 09/23/2020   Procedure: COLONOSCOPY WITH PROPOFOL;  Surgeon: Midge Minium, MD;  Location: [facility] ENDOSCOPY;  Service: Endoscopy;  Laterality: N/A;   CORONARY ARTERY BYPASS GRAFT     LIMA-D2, SVG-OM, SeqSVG-D1-LAD   CORONARY STENT INTERVENTION N/A 11/12/2020   Procedure: CORONARY  STENT INTERVENTION;  Surgeon: Iran Ouch, MD;  Location: [facility] INVASIVE CV LAB;  Service: Cardiovascular;  Laterality: N/A;   EYE SURGERY     IABP INSERTION N/A 11/11/2020   Procedure: IABP Insertion;  Surgeon: Yvonne [place], MD;  Location: [facility] INVASIVE CV LAB;  Service: Cardiovascular;  Laterality: N/A;   IR STENT PLACEMENT ANTE CAROTID INC ANGIO     KNEE SURGERY     RIGHT/LEFT HEART CATH AND CORONARY/GRAFT ANGIOGRAPHY N/A 11/11/2020   Procedure: RIGHT/LEFT HEART CATH AND CORONARY/GRAFT ANGIOGRAPHY;  Surgeon: Yvonne [place], MD;  Location: [facility] INVASIVE CV LAB;  Service: Cardiovascular;  Severe native CAD: 90% dLMCA-100% mLAD/100% ost LCx CTO w/ Severe diffuse RCA disease. Widely patent LIMA-D1. Patent sequential SVG-D2-LAD with 99% mid graft & 90% LIMA-D2 @ anastomosis. previously stented SVG-OM - CTO. Mildly   TRANSURETHRAL RESECTION OF PROSTATE  1994   URINARY SPHINCTER IMPLANT  1995    Current Medications: Current Meds  Medication Sig   acetaminophen (TYLENOL) 325 MG tablet Take 2 tablets (650 mg total) by mouth every 6 (six) hours as needed for mild pain (or Fever >/= 101).   albuterol (VENTOLIN HFA) 108 (90 Base) MCG/ACT inhaler INHALE 2 PUFFS INTO THE LUNGS EVERY 6 HOURS AS NEEDED FOR WHEEZING OR SHORTNESS OF BREATH   allopurinol (ZYLOPRIM) 300 MG tablet Take 300 mg by mouth daily.   amLODipine (NORVASC) 10 MG tablet TAKE 1 TABLET(10 MG) BY MOUTH DAILY   aspirin EC 81 MG tablet Take 81 mg by mouth daily. Swallow whole.   Budeson-Glycopyrrol-Formoterol (BREZTRI AEROSPHERE) 160-9-4.8 MCG/ACT AERO Inhale 2 puffs into the lungs 2 (two) times daily.   carvedilol (COREG) 3.125 MG tablet Take 1 tablet (3.125 mg total) by mouth 2 (two) times daily.   Cholecalciferol (VITAMIN D3 PO) Take 4,000 Units by mouth.   clopidogrel (PLAVIX) 75 MG tablet TAKE 1 TABLET(75 MG) BY MOUTH DAILY   Coenzyme Q10 (CO Q-10) 400 MG CAPS Take 400 mg by mouth daily at 6 (six) AM.   colchicine 0.6 MG  tablet Take 1 tablet (0.6 mg total) by mouth 2 (two) times daily.   desoximetasone (TOPICORT) 0.25 % cream Apply 1 application. topically 2 (two) times daily.   escitalopram (LEXAPRO) 20 MG tablet TAKE 1 TABLET(20 MG) BY MOUTH DAILY   fenofibrate (TRICOR) 145 MG tablet Take 1 tablet (145 mg total) by mouth daily.   glimepiride (AMARYL) 2 MG tablet Take 1 tablet (2 mg total) by mouth daily with breakfast. TAKE 1 TABLET(1 MG) BY MOUTH DAILY WITH BREAKFAST   hydrALAZINE (APRESOLINE) 100 MG tablet Take 1 tablet (100 mg total) by mouth 3 (three) times daily.   HYDROcodone-acetaminophen (NORCO/VICODIN) 5-325 MG tablet Take 1 tablet by mouth every 6 (six) hours as needed for moderate pain.   isosorbide mononitrate (IMDUR) 30 MG 24 hr tablet Take 1 tablet (30 mg total) by mouth daily.   loratadine (CLARITIN) 10 MG tablet Take 1 tablet (10 mg total) by mouth daily.   lovastatin (MEVACOR) 40 MG tablet TAKE 1 TABLET(40  MG) BY MOUTH DAILY   nitroGLYCERIN (NITROSTAT) 0.4 MG SL tablet Place 1 tablet (0.4 mg total) under the tongue every 5 (five) minutes as needed for chest pain.   Omega-3 Fatty Acids (FISH OIL) 1200 MG CAPS Take 1,200 mg by mouth daily at 6 (six) AM.   pregabalin (LYRICA) 100 MG capsule Take 1 capsule (100 mg total) by mouth 2 (two) times daily.   torsemide (DEMADEX) 20 MG tablet Take 1 tablet (20 mg total) by mouth daily as needed.   traZODone (DESYREL) 150 MG tablet TAKE 1 TABLET(150 MG) BY MOUTH AT BEDTIME     Allergies:   Dilaudid [hydromorphone], Butorphanol, Morphine, Pedi-pre tape spray [wound dressing adhesive], Statins, and Zocor [simvastatin]   Social History   Socioeconomic History   Marital status: Married    Spouse name: Not on file   Number of children: 2   Years of education: Not on file   Highest education level: High school graduate  Occupational History   Occupation: retired  Tobacco Use   Smoking status: Former    Packs/day: 1.00    Years: 30.00    Additional  pack years: 0.00    Total pack years: 30.00    Types: Cigarettes    Quit date: 10/23/2003    Years since quitting: 18.6   Smokeless tobacco: Never  Vaping Use   Vaping Use: Never used  Substance and Sexual Activity   Alcohol use: Never   Drug use: Never   Sexual activity: Not on file  Other Topics Concern   Not on file  Social History Narrative   Not on file   Social Determinants of Health   Financial Resource Strain: Low Risk  (05/19/2021)   Overall Financial Resource Strain (CARDIA)    Difficulty of Paying Living Expenses: Not hard at all  Food Insecurity: No Food Insecurity (05/19/2021)   Hunger Vital Sign    Worried About Running Out of Food in the Last Year: Never true    Ran Out of Food in the Last Year: Never true  Transportation Needs: No Transportation Needs (05/19/2021)   PRAPARE - Administrator, Civil Service (Medical): No    Lack of Transportation (Non-Medical): No  Physical Activity: Inactive (05/19/2021)   Exercise Vital Sign    Days of Exercise per Week: 0 days    Minutes of Exercise per Session: 0 min  Stress: No Stress Concern Present (05/19/2021)   [employer] of Occupational Health - Occupational Stress Questionnaire    Feeling of Stress : Only a little  Recent Concern: Stress - Stress Concern Present (02/26/2021)   [employer] of Occupational Health - Occupational Stress Questionnaire    Feeling of Stress : To some extent  Social Connections: Unknown (05/19/2021)   Social Connection and Isolation Panel [NHANES]    Frequency of Communication with Friends and Family: Not on file    Frequency of Social Gatherings with Friends and Family: Twice a week    Attends Religious Services: More than 4 times per year    Active Member of [employer] or Organizations: No    Attends Banker Meetings: Never    Marital Status: Married     Family History: The patient's family history includes Heart Problems in his father and mother.  ROS:    Please see the history of present illness.     All other systems reviewed and are negative.  EKGs/Labs/Other Studies Reviewed:    The following studies were reviewed today:  Echo 10/2021  1. Left ventricular ejection fraction, by estimation, is 50 to 55%. The  left ventricle has low normal function. Select images concerning for  inferior wall hypokinesis. Left ventricular diastolic parameters are  consistent with Grade I diastolic  dysfunction (impaired relaxation).   2. Right ventricular systolic function is normal. The right ventricular  size is normal.   3. Left atrial size was mildly dilated.   4. The mitral valve was not well visualized. Mild mitral valve  regurgitation. No evidence of mitral stenosis.   5. The aortic valve was not well visualized. Aortic valve regurgitation  is mild. No aortic stenosis is present.   6. The inferior vena cava is normal in size with greater than 50%  respiratory variability, suggesting right atrial pressure of 3 mmHg.    Myoview Lexiscan 10/2021 Narrative & Impression  Pharmacological myocardial perfusion imaging study with no significant  ischemia Moderate-sized region fixed defect in the basal to mid inferior wall, mid anteroseptal region Basal to mid inferior wall hypokinesis, EF estimated at 39% Resting EKG with diffuse nonspecific ST and T wave abnormality, more pronounced with pharmacologic infusion with symptoms of chest discomfort No EKG changes concerning for ischemia at peak stress or in recovery. Moderate risk scan in the setting of fixed perfusion defect     Signed, Dossie Arbour, MD, Ph.D [facility]        R/L heart cath 10/2020     Conclusions: Severe native coronary artery disease including 90% distal LMCA stenosis, 100% mid LAD occlusion, 100% ostial LCx occlusion, and severe diffuse RCA disease. Widely patent LIMA-D1. Patent sequential SVG-D2-LAD with 99% mid graft stenosis as well as 90% stenosis at the LIMA-D2  anastomosis. Chronically occluded, previously stented SVG-OM. Mildly elevated left and right heart filling pressures. Moderate pulmonary hypertension. Normal-supranormal Fick cardiac output/index. Successful placement of 50 mL intra-aortic balloon pump via the right common femoral artery.   Recommendations: Images reviewed with Dr. Kirke Corin.  We agree that patient is high risk for revascularization given his severe native and graft disease.  His distal targets are small and diffusely diseased, making them suboptimal for redo-CABG.  We will transfer to [facility] for possible high risk PCI to SVG-D2-LAD tomorrow. Titrate IV nitroglycerin for relief of chest pain. Aggressive secondary prevention.   Yvonne Kendall, MD   Coronary stent intervention 10/2020   Dist LM lesion is 90% stenosed.   Mid LAD lesion is 100% stenosed.   Ost Cx to Prox Cx lesion is 100% stenosed.   Dist Graft lesion before 2nd Diag  is 99% stenosed.   Prox Graft lesion between 2nd Diag and Mid LAD  is 90% stenosed.   1st Diag lesion is 100% stenosed.   Insertion lesion before 2nd Diag  is 90% stenosed.   A drug-eluting stent was successfully placed using a STENT ONYX FRONTIER 4.0X15.   A drug-eluting stent was successfully placed using a STENT ONYX FRONTIER 4.0X15.   Post intervention, there is a 0% residual stenosis.   Post intervention, there is a 0% residual stenosis.   SVG and is large.   Successful drug-eluting stent placement to the mid SVG to LAD/diagonal using distal protection device. Successful drug-eluting stent placement to the distal SVG to LAD/diagonal without distal protection device due to distal location.   Recommendations: Dual antiplatelet therapy indefinitely. Aggressive treatment of risk factors. Recommend removing intra-aortic balloon pump at 8:30 PM to allow washout of heparin.    EKG:  EKG is ordered today.  The  ekg ordered today demonstrates NSR 62bpm, lateral TWI, no new changes  Recent  Labs: 09/17/2021: NT-Pro BNP 715 11/12/2021: Magnesium 2.0 12/28/2021: ALT 23; B Natriuretic Peptide 202.9; BUN 35; Creatinine, Ser 2.23; Hemoglobin 14.2; Platelets 258; Potassium 5.3; Sodium 141  Recent Lipid Panel    Component Value Date/Time   CHOL 172 12/28/2021 1146   CHOL 156 07/21/2020 1440   TRIG 184 (H) 12/28/2021 1146   HDL 37 (L) 12/28/2021 1146   HDL 34 (L) 07/21/2020 1440   CHOLHDL 4.6 12/28/2021 1146   VLDL 37 12/28/2021 1146   LDLCALC 98 12/28/2021 1146   LDLCALC 84 07/21/2020 1440    Physical Exam:    VS:  BP (!) 162/76 (BP Location: Left Arm, Patient Position: Sitting, Cuff Size: Large)   Pulse 62   Ht 5\' 10"  (1.778 m)   Wt 292 lb 3.2 oz (132.5 kg)   SpO2 91%   BMI 41.93 kg/m     Wt Readings from Last 3 Encounters:  06/29/22 292 lb 3.2 oz (132.5 kg)  05/31/22 289 lb (131.1 kg)  05/13/22 288 lb (130.6 kg)     GEN:  Well nourished, well developed in no acute distress HEENT: Normal NECK: No JVD; No carotid bruits LYMPHATICS: No lymphadenopathy CARDIAC: RRR, no murmurs, rubs, gallops RESPIRATORY:  Clear to auscultation without rales, wheezing or rhonchi  ABDOMEN: Soft, non-tender, non-distended MUSCULOSKELETAL:  No edema; No deformity  SKIN: Warm and dry NEUROLOGIC:  Alert and oriented x 3 PSYCHIATRIC:  Normal affect   ASSESSMENT:    1. Coronary artery disease involving coronary bypass graft of native heart with angina pectoris   2. Chronic diastolic heart failure   3. Essential hypertension   4. Hyperlipidemia, mixed    PLAN:    In order of problems listed above:  Atypical chest pain CAD s/p CABG and subsequent PCI Patient reports episodes of atypical chest pain.  Patient feels chest pain may be due to COPD as he is supposed to wear oxygen but does not.  O2 today is 91% at rest, I recommended he wear O2 24/7.  He has follow-up with pulmonology next month.  Patient had a Myoview Lexiscan in August 2023 with no significant ischemia, moderate risk  scan in the setting of fixed perfusion defect. I will add on Imdur 30 mg daily. Continue DAPT indefinitely with aspirin and Plavix.  Continue  Chronic diastolic heart failure Patient is euvolemic on exam.  Echo last year showed LVEF 50 to 55% with grade 1 diastolic dysfunction.  He takes torsemide 20 mg daily.  HTN BP is still high today 162/76 on re-check.  He does not regularly check blood pressure at home.  Patient is on amlodipine 10 mg daily, hydralazine 100 mg 3 times daily and Coreg 3.125 mg twice daily.  Coreg previously decreased due to bradycardia with heart rates in the 40s.  I will add on Imdur 30 mg daily as above. I recommended he check BP at home.  HLD LDL 98, continue lovastatin and Tricor.  He could not afford Repatha.  Disposition: Follow up in 2 month(s) with MD/APP    Signed, Malikai Gut David Stall, PA-C  06/29/2022 2:02 PM    [facility]

## 2022-06-29 NOTE — Patient Instructions (Signed)
Medication Instructions:  Your physician has recommended you make the following change in your medication:   START - isosorbide mononitrate (IMDUR) 30 MG 24 hr tablet - Take 1 tablet by mouth daily  *If you need a refill on your cardiac medications before your next appointment, please call your pharmacy*  Lab Work: -None ordered If you have labs (blood work) drawn today and your tests are completely normal, you will receive your results only by: MyChart Message (if you have MyChart) OR A paper copy in the mail If you have any lab test that is abnormal or we need to change your treatment, we will call you to review the results.  Testing/Procedures: -None ordered  Follow-Up: At [facility], you and your health needs are our priority.  As part of our continuing mission to provide you with exceptional heart care, we have created designated Provider Care Teams.  These Care Teams include your primary Cardiologist (physician) and Advanced Practice Providers (APPs -  Physician Assistants and Nurse Practitioners) who all work together to provide you with the care you need, when you need it.  We recommend signing up for the patient portal called "MyChart".  Sign up information is provided on this After Visit Summary.  MyChart is used to connect with patients for Virtual Visits (Telemedicine).  Patients are able to view lab/test results, encounter notes, upcoming appointments, etc.  Non-urgent messages can be sent to your provider as well.   To learn more about what you can do with MyChart, go to [email or web address].    Your next appointment:   2 month(s)  Provider:   You may see Debbe Odea, MD or one of the following Advanced Practice Providers on your designated Care Team:   Nicolasa Ducking, NP Eula Listen, PA-C Cadence Fransico Michael, PA-C Charlsie Quest, NP   Other Instructions -None

## 2022-06-30 DIAGNOSIS — G4733 Obstructive sleep apnea (adult) (pediatric): Secondary | ICD-10-CM | POA: Diagnosis not present

## 2022-07-05 ENCOUNTER — Other Ambulatory Visit: Payer: Self-pay | Admitting: Family Medicine

## 2022-07-05 NOTE — Telephone Encounter (Signed)
Medication Refill - Medication:  HYDROcodone-acetaminophen (NORCO/VICODIN) 5-325 MG tablet   Has the patient contacted their pharmacy? Yes.   (Agent: If no, request that the patient contact the pharmacy for the refill. If patient does not wish to contact the pharmacy document the reason why and proceed with request.) (Agent: If yes, when and what did the pharmacy advise?)  Preferred Pharmacy (with phone number or street name):  [id number] - [place], [place] - [address] AT [facility] [address] [facility] [address]  [address] [place] [address]  Phone: [phone] Fax: [phone]   Has the patient been seen for an appointment in the last year OR does the patient have an upcoming appointment? Yes.    Agent: Please be advised that RX refills may take up to 3 business days. We ask that you follow-up with your pharmacy.

## 2022-07-05 NOTE — Telephone Encounter (Signed)
Requested medication (s) are due for refill today: yes  Requested medication (s) are on the active medication list: yes  Last refill:  05/27/22 #30/0  Future visit scheduled: yes  Notes to clinic:  Unable to refill per protocol, cannot delegate.    Requested Prescriptions  Pending Prescriptions Disp Refills   HYDROcodone-acetaminophen (NORCO/VICODIN) 5-325 MG tablet 30 tablet 0    Sig: Take 1 tablet by mouth every 6 (six) hours as needed for moderate pain.     Not Delegated - Analgesics:  Opioid Agonist Combinations Failed - 07/05/2022  1:11 PM      Failed - This refill cannot be delegated      Failed - Urine Drug Screen completed in last 360 days      Passed - Valid encounter within last 3 months    Recent Outpatient Visits           1 month ago Acute gout due to renal impairment involving toe of left foot   [place] [facility] Merita Norton T, FNP   1 month ago Diabetic polyneuropathy associated with type 2 diabetes mellitus [facility])   [place] [facility] [place], Marzella Schlein, MD   3 months ago Infected sebaceous cyst   [place] [facility] [place], Marzella Schlein, MD   4 months ago Hypertension associated with diabetes [facility])   [place] [facility] Beryle Flock, Marzella Schlein, MD   6 months ago Hypertension associated with diabetes [facility])   [place] [facility] [place], [place], PA-C       Future Appointments             In 1 month Coralyn Helling, MD [facility] [place] [facility] [place]   In 1 month Bacigalupo, Marzella Schlein, MD [facility]   In 1 month Agbor-Etang, Arlys John, MD [facility] at [facility]

## 2022-07-06 MED ORDER — HYDROCODONE-ACETAMINOPHEN 5-325 MG PO TABS: 1.0000 | ORAL_TABLET | Freq: Four times a day (QID) | ORAL | 0 refills | Status: DC | PRN

## 2022-07-07 ENCOUNTER — Telehealth: Payer: Self-pay | Admitting: Family Medicine

## 2022-07-07 ENCOUNTER — Other Ambulatory Visit: Payer: Self-pay

## 2022-07-07 MED ORDER — TRAZODONE HCL 150 MG PO TABS: ORAL_TABLET | ORAL | 1 refills | Status: DC

## 2022-07-07 NOTE — Telephone Encounter (Signed)
[facility] requesting refill prescription traZODone (DESYREL) 150 MG tablet   Please advise

## 2022-07-19 ENCOUNTER — Other Ambulatory Visit: Payer: Self-pay | Admitting: Family Medicine

## 2022-07-19 DIAGNOSIS — F411 Generalized anxiety disorder: Secondary | ICD-10-CM

## 2022-07-19 NOTE — Telephone Encounter (Signed)
Medication Refill - Medication:   clopidogrel (PLAVIX) 75 MG tablet  escitalopram (LEXAPRO) 20 MG tablet   Has the patient contacted their pharmacy? Yes.   (Agent: If no, request that the patient contact the pharmacy for the refill. If patient does not wish to contact the pharmacy document the reason why and proceed with request.) (Agent: If yes, when and what did the pharmacy advise?)  Preferred Pharmacy (with phone number or street name):  [id number] - [place], [place] - [address] AT [facility] [address] [facility] [address]  [address] [place] [address]  Phone: [phone] Fax: [phone]   Has the patient been seen for an appointment in the last year OR does the patient have an upcoming appointment? Yes.    Agent: Please be advised that RX refills may take up to 3 business days. We ask that you follow-up with your pharmacy.

## 2022-07-20 MED ORDER — ESCITALOPRAM OXALATE 20 MG PO TABS
ORAL_TABLET | ORAL | 1 refills | Status: DC
Start: 2022-07-20 — End: 2023-01-17

## 2022-07-20 MED ORDER — CLOPIDOGREL BISULFATE 75 MG PO TABS: ORAL_TABLET | ORAL | 1 refills | Status: DC

## 2022-07-20 NOTE — Telephone Encounter (Signed)
Requested Prescriptions  Pending Prescriptions Disp Refills   clopidogrel (PLAVIX) 75 MG tablet 90 tablet 1    Sig: TAKE 1 TABLET(75 MG) BY MOUTH DAILY     Hematology: Antiplatelets - clopidogrel Failed - 07/19/2022 10:09 AM      Failed - HCT in normal range and within 180 days    HCT  Date Value Ref Range Status  12/28/2021 45.0 39.0 - 52.0 % Final   Hematocrit  Date Value Ref Range Status  04/22/2020 46.9 37.5 - 51.0 % Final         Failed - HGB in normal range and within 180 days    Hemoglobin  Date Value Ref Range Status  12/28/2021 14.2 13.0 - 17.0 g/dL Final  16/12/9602 54.0 13.0 - 17.7 g/dL Final         Failed - PLT in normal range and within 180 days    Platelets  Date Value Ref Range Status  12/28/2021 258 150 - 400 K/uL Final  04/22/2020 221 150 - 450 x10E3/uL Final         Failed - Cr in normal range and within 360 days    Creatinine, Ser  Date Value Ref Range Status  12/28/2021 2.23 (H) 0.61 - 1.24 mg/dL Final         Passed - Valid encounter within last 6 months    Recent Outpatient Visits           1 month ago Acute gout due to renal impairment involving toe of left foot   [facility] Merita Norton T, FNP   2 months ago Diabetic polyneuropathy associated with type 2 diabetes mellitus [facility])   [facility] [place], Marzella Schlein, MD   3 months ago Infected sebaceous cyst   [facility] [place], Marzella Schlein, MD   5 months ago Hypertension associated with diabetes [facility])   [place] [facility] [place], Marzella Schlein, MD   6 months ago Hypertension associated with diabetes [facility])   [place] [facility] [place], [place], PA-C       Future Appointments             In 2 weeks Coralyn Helling, MD [facility] [place] [facility] Care at [place]   In 3 weeks Bacigalupo, Marzella Schlein, MD [facility], [facility]   In 1 month [place],  Arlys John, MD [place] [facility]             escitalopram (LEXAPRO) 20 MG tablet 90 tablet 1    Sig: TAKE 1 TABLET(20 MG) BY MOUTH DAILY     Psychiatry:  Antidepressants - SSRI Passed - 07/19/2022 10:09 AM      Passed - Valid encounter within last 6 months    Recent Outpatient Visits           1 month ago Acute gout due to renal impairment involving toe of left foot   [facility] Merita Norton T, FNP   2 months ago Diabetic polyneuropathy associated with type 2 diabetes mellitus [facility])   [place] [facility] [place], Marzella Schlein, MD   3 months ago Infected sebaceous cyst   [place] Erasmo Downer, MD   5 months ago Hypertension associated with diabetes [facility])   [place] [facility] Erasmo Downer, MD   6 months ago Hypertension associated with diabetes [facility])   [facility] [place]  [facility] [place], Edmon Crape, PA-C       Future Appointments             In 2 weeks Coralyn Helling, MD [facility] Care at [place]   In 3 weeks Bacigalupo, Marzella Schlein, MD [facility], [facility]   In 1 month Debbe Odea, MD [facility] at [facility]

## 2022-07-21 ENCOUNTER — Other Ambulatory Visit: Payer: Self-pay | Admitting: Family Medicine

## 2022-07-21 ENCOUNTER — Other Ambulatory Visit: Payer: Self-pay | Admitting: Obstetrics and Gynecology

## 2022-07-21 ENCOUNTER — Other Ambulatory Visit: Payer: Self-pay

## 2022-07-21 DIAGNOSIS — F411 Generalized anxiety disorder: Secondary | ICD-10-CM

## 2022-07-21 MED ORDER — TORSEMIDE 20 MG PO TABS: 20.0000 mg | ORAL_TABLET | Freq: Every day | ORAL | 1 refills | Status: DC | PRN

## 2022-07-21 NOTE — Telephone Encounter (Signed)
Requested Prescriptions  Pending Prescriptions Disp Refills   hydrALAZINE (APRESOLINE) 100 MG tablet [Pharmacy Med Name: HYDRALAZINE 100MG  (HUNDRED MG) TABS] 270 tablet 1    Sig: TAKE 1 TABLET(100 MG) BY MOUTH THREE TIMES DAILY     Cardiovascular:  Vasodilators Failed - 07/21/2022  6:34 AM      Failed - ANA Screen, Ifa, Serum in normal range and within 360 days    No results found for: "ANA", "ANATITER", "LABANTI"       Failed - Last BP in normal range    BP Readings from Last 1 Encounters:  06/29/22 (!) 162/76         Passed - HCT in normal range and within 360 days    HCT  Date Value Ref Range Status  12/28/2021 45.0 39.0 - 52.0 % Final   Hematocrit  Date Value Ref Range Status  04/22/2020 46.9 37.5 - 51.0 % Final         Passed - HGB in normal range and within 360 days    Hemoglobin  Date Value Ref Range Status  12/28/2021 14.2 13.0 - 17.0 g/dL Final  57/84/6962 95.2 13.0 - 17.7 g/dL Final         Passed - RBC in normal range and within 360 days    RBC  Date Value Ref Range Status  12/28/2021 4.89 4.22 - 5.81 MIL/uL Final         Passed - WBC in normal range and within 360 days    WBC  Date Value Ref Range Status  12/28/2021 8.5 4.0 - 10.5 K/uL Final         Passed - PLT in normal range and within 360 days    Platelets  Date Value Ref Range Status  12/28/2021 258 150 - 400 K/uL Final  04/22/2020 221 150 - 450 x10E3/uL Final         Passed - Valid encounter within last 12 months    Recent Outpatient Visits           1 month ago Acute gout due to renal impairment involving toe of left foot   [facility], FNP   2 months ago Diabetic polyneuropathy associated with type 2 diabetes mellitus [facility])   [facility] [place], Marzella Schlein, MD   3 months ago Infected sebaceous cyst   [facility] [place], Marzella Schlein, MD   5 months ago Hypertension associated with diabetes  [facility])   [facility] [place], Marzella Schlein, MD   7 months ago Hypertension associated with diabetes [facility])   [place] [facility] [place], [place], PA-C       Future Appointments             In 2 weeks Coralyn Helling, MD [facility] [place] [facility]monary Care at [place]   In 2 weeks Bacigalupo, Marzella Schlein, MD [facility], [facility]   In 1 month Debbe Odea, MD [place] [facility]             lovastatin (MEVACOR) 40 MG tablet [Pharmacy Med Name: LOVASTATIN 40MG  TABLETS] 90 tablet 1    Sig: TAKE 1 TABLET(40 MG) BY MOUTH DAILY     Cardiovascular:  Antilipid - Statins 2 Failed - 07/21/2022  6:34 AM      Failed - Cr in normal range and within 360 days    Creatinine, Ser  Date Value Ref Range  Status  12/28/2021 2.23 (H) 0.61 - 1.24 mg/dL Final         Failed - Lipid Panel in normal range within the last 12 months    Cholesterol, Total  Date Value Ref Range Status  07/21/2020 156 100 - 199 mg/dL Final   Cholesterol  Date Value Ref Range Status  12/28/2021 172 0 - 200 mg/dL Final   LDL Chol Calc (NIH)  Date Value Ref Range Status  07/21/2020 84 0 - 99 mg/dL Final   LDL Cholesterol  Date Value Ref Range Status  12/28/2021 98 0 - 99 mg/dL Final    Comment:           Total Cholesterol/HDL:CHD Risk Coronary Heart Disease Risk Table                     Men   Women  1/2 Average Risk   3.4   3.3  Average Risk       5.0   4.4  2 X Average Risk   9.6   7.1  3 X Average Risk  23.4   11.0        Use the calculated Patient Ratio above and the CHD Risk Table to determine the patient's CHD Risk.        ATP III CLASSIFICATION (LDL):  <100     mg/dL   Optimal  161-096  mg/dL   Near or Above                    Optimal  130-159  mg/dL   Borderline  045-409  mg/dL   High  >811     mg/dL   Very High Performed at [facility], [address] [place], [place] [address]    HDL  Date Value  Ref Range Status  12/28/2021 37 (L) >40 mg/dL Final  29/56/2130 34 (L) >39 mg/dL Final   Triglycerides  Date Value Ref Range Status  12/28/2021 184 (H) <150 mg/dL Final         Passed - Patient is not pregnant      Passed - Valid encounter within last 12 months    Recent Outpatient Visits           1 month ago Acute gout due to renal impairment involving toe of left foot   [facility] T, FNP   2 months ago Diabetic polyneuropathy associated with type 2 diabetes mellitus [facility])   [place] [facility] [place], Marzella Schlein, MD   3 months ago Infected sebaceous cyst   [place] [facility] [place], Marzella Schlein, MD   5 months ago Hypertension associated with diabetes [facility])   [place] [facility] [place], Marzella Schlein, MD   7 months ago Hypertension associated with diabetes [facility])   [place] [place], Edmon Crape, PA-C       Future Appointments             In 2 weeks Coralyn Helling, MD [facility] [place] Pulmonary Care at [employer]   In 2 weeks Bacigalupo, Marzella Schlein, MD [facility]   In 1 month Debbe Odea, MD [place] [facility]             escitalopram (LEXAPRO) 20 MG tablet [Pharmacy Med Name: ESCITALOPRAM 20MG  TABLETS] 90 tablet 1    Sig: TAKE 1 TABLET(20 MG) BY MOUTH DAILY  Psychiatry:  Antidepressants - SSRI Passed - 07/21/2022  6:34 AM      Passed - Valid encounter within last 6 months    Recent Outpatient Visits           1 month ago Acute gout due to renal impairment involving toe of left foot   [facility] Mer[facility] Norton T, FNP   2 months ago Diabetic polyneuropathy associated with type 2 diabetes mellitus [facility])   [place] [facility] [place], Marzella Schlein, MD   3 months ago Infected sebaceous cyst   [place] [facility] Erasmo Downer, MD   5 months ago Hypertension associated with diabetes [facility])   [place] [facility] [place], Marzella Schlein, MD   7 months ago Hypertension associated with diabetes [facility])   [place] [facility] [place], [place], PA-C       Future Appointments             In 2 weeks Coralyn Helling, MD [facility] Care at [place]   In 2 weeks Bacigalupo, Marzella Schlein, MD [facility], [facility]   In 1 month Debbe Odea, MD [facility]

## 2022-07-30 DIAGNOSIS — G4733 Obstructive sleep apnea (adult) (pediatric): Secondary | ICD-10-CM | POA: Diagnosis not present

## 2022-08-04 ENCOUNTER — Telehealth: Payer: Self-pay

## 2022-08-04 ENCOUNTER — Encounter: Payer: Self-pay | Admitting: Pulmonary Disease

## 2022-08-04 ENCOUNTER — Ambulatory Visit
Admission: RE | Admit: 2022-08-04 | Discharge: 2022-08-04 | Disposition: A | Discharge status: Home / Self Care | Payer: Medicare Other | Source: Ambulatory Visit | Attending: Pulmonary Disease | Admitting: Pulmonary Disease

## 2022-08-04 ENCOUNTER — Ambulatory Visit: Payer: Medicare Other | Admitting: Pulmonary Disease

## 2022-08-04 VITALS — BP 136/80 | HR 52 | Temp 97.7°F | Ht 67.0 in | Wt 291.8 lb

## 2022-08-04 DIAGNOSIS — J811 Chronic pulmonary edema: Secondary | ICD-10-CM | POA: Diagnosis not present

## 2022-08-04 DIAGNOSIS — J418 Mixed simple and mucopurulent chronic bronchitis: Secondary | ICD-10-CM

## 2022-08-04 DIAGNOSIS — J9611 Chronic respiratory failure with hypoxia: Secondary | ICD-10-CM | POA: Diagnosis not present

## 2022-08-04 DIAGNOSIS — R0609 Other forms of dyspnea: Secondary | ICD-10-CM | POA: Insufficient documentation

## 2022-08-04 DIAGNOSIS — R0600 Dyspnea, unspecified: Secondary | ICD-10-CM | POA: Diagnosis not present

## 2022-08-04 MED ORDER — ALBUTEROL SULFATE (2.5 MG/3ML) 0.083% IN NEBU: 2.5000 mg | INHALATION_SOLUTION | Freq: Four times a day (QID) | RESPIRATORY_TRACT | 5 refills | Status: DC | PRN

## 2022-08-04 NOTE — Patient Instructions (Addendum)
Chest xray today.  Will arrange for a home nebulizer.  Albuterol every 6 hours as needed for cough, wheeze, chest congestion, or shortness of breath.  Breztri two puffs in the morning and two puffs in the evening, and rinse your mouth after each use.  Will arrange for a pulse oximeter to monitor your oxygen levels at home.  Goal is to keep your oxygen level between 92 to 95%.  Follow up in 8 weeks.

## 2022-08-04 NOTE — Telephone Encounter (Signed)
08/04/2022 03:38 PM EDT by Sue Lush, LPN  Outgoing Alvarez, Jezreel Oh" (Self) [phone] (Home) Remove Left Message - i called pt at scheduled time (2:30pm). His wife indicates he was not home and call back in later if able. I called pt again and no answer. I left vm on personal vm to rtn call to r/s AWV

## 2022-08-04 NOTE — Progress Notes (Signed)
Tony Williams, Critical Care, and Sleep Medicine  Chief Complaint  Patient presents with   Follow-up    Wearing bipap nightly- pressure and mask is okay. No concerns.  SOB with exertion, dry cough and mild wheezing.     Past Surgical History:  He  has a past surgical history that includes Eye surgery; Coronary artery bypass graft; IR STENT PLACEMENT ANTE CAROTID INC ANGIO; Knee surgery; Colonoscopy with propofol (N/A, 09/23/2020); Transurethral resection of prostate (1994); Urinary sphincter implant (1995); Cholecystectomy, laparoscopic (12/26/1995); RIGHT/LEFT HEART CATH AND CORONARY/GRAFT ANGIOGRAPHY (N/A, 11/11/2020); IABP Insertion (N/A, 11/11/2020); and CORONARY STENT INTERVENTION (N/A, 11/12/2020).  Past Medical History:  CAD s/p CABG, Bladder cancer, CKD 4, COPD, Depression, DM type 2  Constitutional:  BP 136/80 (BP Location: Right Wrist, Cuff Size: Normal)   Pulse (!) 52   Temp 97.7 F (36.5 C) (Temporal)   Ht 5\' 7"  (1.702 m)   Wt 291 lb 12.8 oz (132.4 kg)   SpO2 91%   BMI 45.70 kg/m   Brief Summary:  Tony Williams is a 77 y.o. 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      Subjective:   He is here with his wife.  He gets winded with minimal activity.  He doesn't do much at home.  He gets dry cough and occasional wheezing.  Not sure if inhalers are helping.  He feels good as long as he is sitting.  He sometimes get pressure in his chest when walking, but resolves after resting a minute.  He plans to start pool exercises again now that the weather is getting warmer.  Uses Bipap nightly.  No issues with mask fit or pressure setting.  He uses 2 liters oxygen intermittently during the day.  His SpO2 on 2 liters walking today was 90%.  He was only able to walk one lap before feeling dizzy and then short of breath.  Physical Exam:   Appearance - well kempt   ENMT - no  sinus tenderness, no oral exudate, no LAN, Mallampati 3 airway, no stridor  Respiratory - equal breath sounds bilaterally, no wheezing or rales  CV - s1s2 regular rate and rhythm, no murmurs  Ext - no clubbing, no edema  Skin - no rashes  Psych - normal mood and affect   Williams testing:  PFT 01/22/21 >> FEV1 1.54 (51%), FEV1% 75, TLC 3.43 (50%), DLCO 54%  Chest Imaging:  CT angio chest 11/08/20 >> fibrothorax on Rt with volume loss V/Q scan 01/28/21 >> low probability  Sleep Tests:  PSG 05/13/21 >> AHI 65.8, SpO2 low 84% Auto Bipap 07/04/22 to 08/02/22 >> used on 30 of 30 nights with average 9 hrs 19 min.  Average AHI 4.4 with median Bipap 15/15 and 95 th percentile Bipap 18/18 cm H2O  Cardiac Tests:  Echo 10/29/21 >> EF 50 to 55%, grade 1 DD, mild LA dilation, mild MR, mild AR  Social History:  He  reports that he quit smoking about 18 years ago. His smoking use included cigarettes. He has a 30.00 pack-year smoking history. He has never used smokeless tobacco. He reports that he does not drink alcohol and does not use drugs.  Family History:  His family history includes Heart Problems in his father and mother.     Assessment/Plan:   Dyspnea on exertion. - likely related to obesity with deconditioning - discussed importance of maintaining a regular exercise regimen - will get  chest xray today - advised him to follow up with cardiology if his dyspnea persists since his symptoms could also be related to heart disease  Obstructive sleep apnea. - he is complaint with therapy and reports benefit from Bipap - he uses [facility] for his DME - current auto Bipap ordered on 05/29/21 - continue auto Bipap with max IPAP 18, min EPAP 10, PS 0 cm H2O  COPD with chronic bronchitis. - continue breztri two puffs bid - prn albuterol - will arrange for a home nebulizer - inhaler technique demonstrate  Chronic respiratory failure with hypoxia. - goal SpO2 92 to 95% - he  can use 2 to 4 liters oxygen - will arrange for a pulse oximeter  Restrictive lung disease. 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 - prn claritin  CAD s/p CABG. - followed [facility] Dr. Debbe [facility]a [facility]  Time Spent Involved in Patient Care on Day of Examination:  46 minutes  Follow up:   Patient Instructions  Chest xray today.  Will arrange for a home nebulizer.  Albuterol every 6 hours as needed for cough, wheeze, chest congestion, or shortness of breath.  Breztri two puffs in the morning and two puffs in the evening, and rinse your mouth after each use.  Will arrange for a pulse oximeter to monitor your oxygen levels at home.  Goal is to keep your oxygen level between 92 to 95%.  Follow up in 8 weeks.  Medication List:   Allergies as of 08/04/2022       Reactions   Dilaudid [hydromorphone] Hives   Rash and itch   Butorphanol Itching   Morphine Other (See Comments)   Redness around IV site   Pedi-pre Tape Spray [wound Dressing Adhesive] Itching   Statins Other (See Comments)   "Destroyed my muscles"   Zocor [simvastatin] Other (See Comments)   "Destroyed my muscles"        Medication List        Accurate as of Aug 04, 2022 11:12 AM. If you have any questions, ask your nurse or doctor.          STOP taking these medications    predniSONE 20 MG tablet Commonly known as: DELTASONE Stopped by: Coralyn Helling, MD       TAKE these medications    acetaminophen 325 MG tablet Commonly known as: TYLENOL Take 2 tablets (650 mg total) by mouth every 6 (six) hours as needed for mild pain (or Fever >/= 101).   albuterol 108 (90 Base) MCG/ACT inhaler Commonly known as: VENTOLIN HFA INHALE 2 PUFFS INTO THE LUNGS EVERY 6 HOURS AS NEEDED FOR WHEEZING OR SHORTNESS OF BREATH What changed: Another medication with the same name was added. Make sure you understand how and when to take  each. Changed by: Coralyn Helling, MD   albuterol (2.5 MG/3ML) 0.083% nebulizer solution Commonly known as: PROVENTIL Take 3 mLs (2.5 mg total) by nebulization every 6 (six) hours as needed for wheezing or shortness of breath. What changed: You were already taking a medication with the same name, and this prescription was added. Make sure you understand how and when to take each. Changed by: Coralyn Helling, MD   allopurinol 300 MG tablet Commonly known as: ZYLOPRIM Take 300 mg by mouth daily.   allopurinol 100 MG tablet Commonly known as: Zyloprim Take 1 tablet (100 mg total) by mouth daily.   amLODipine 10 MG tablet  Commonly known as: NORVASC TAKE 1 TABLET(10 MG) BY MOUTH DAILY   aspirin EC 81 MG tablet Take 81 mg by mouth daily. Swallow whole.   Breztri Aerosphere 160-9-4.8 MCG/ACT Aero Generic drug: Budeson-Glycopyrrol-Formoterol Inhale 2 puffs into the lungs 2 (two) times daily.   carvedilol 3.125 MG tablet Commonly known as: COREG Take 1 tablet (3.125 mg total) by mouth 2 (two) times daily.   clopidogrel 75 MG tablet Commonly known as: PLAVIX TAKE 1 TABLET(75 MG) BY MOUTH DAILY   Co Q-10 400 MG Caps Take 400 mg by mouth daily at 6 (six) AM.   colchicine 0.6 MG tablet Take 1 tablet (0.6 mg total) by mouth 2 (two) times daily.   desoximetasone 0.25 % cream Commonly known as: TOPICORT Apply 1 application. topically 2 (two) times daily.   escitalopram 20 MG tablet Commonly known as: LEXAPRO TAKE 1 TABLET(20 MG) BY MOUTH DAILY   fenofibrate 145 MG tablet Commonly known as: Tricor Take 1 tablet (145 mg total) by mouth daily.   Fish Oil 1200 MG Caps Take 1,200 mg by mouth daily at 6 (six) AM.   glimepiride 2 MG tablet Commonly known as: AMARYL Take 1 tablet (2 mg total) by mouth daily with breakfast. TAKE 1 TABLET(1 MG) BY MOUTH DAILY WITH BREAKFAST   hydrALAZINE 100 MG tablet Commonly known as: APRESOLINE TAKE 1 TABLET(100 MG) BY MOUTH THREE TIMES DAILY    HYDROcodone-acetaminophen 5-325 MG tablet Commonly known as: NORCO/VICODIN Take 1 tablet by mouth every 6 (six) hours as needed for moderate pain.   isosorbide mononitrate 30 MG 24 hr tablet Commonly known as: IMDUR Take 1 tablet (30 mg total) by mouth daily.   loratadine 10 MG tablet Commonly known as: CLARITIN Take 1 tablet (10 mg total) by mouth daily.   lovastatin 40 MG tablet Commonly known as: MEVACOR TAKE 1 TABLET(40 MG) BY MOUTH DAILY   nitroGLYCERIN 0.4 MG SL tablet Commonly known as: NITROSTAT Place 1 tablet (0.4 mg total) under the tongue every 5 (five) minutes as needed for chest pain.   pregabalin 100 MG capsule Commonly known as: Lyrica Take 1 capsule (100 mg total) by mouth 2 (two) times daily.   torsemide 20 MG tablet Commonly known as: DEMADEX Take 1 tablet (20 mg total) by mouth daily as needed.   traZODone 150 MG tablet Commonly known as: DESYREL TAKE 1 TABLET(150 MG) BY MOUTH AT BEDTIME   VITAMIN D3 PO Take 4,000 Units by mouth.        Signature:  Coralyn Helling, MD [facility]/Critical Care Pager - [phone] 08/04/2022, 11:13 AM

## 2022-08-10 ENCOUNTER — Encounter: Payer: Self-pay | Admitting: Family Medicine

## 2022-08-10 ENCOUNTER — Ambulatory Visit (INDEPENDENT_AMBULATORY_CARE_PROVIDER_SITE_OTHER): Payer: Medicare Other | Admitting: Family Medicine

## 2022-08-10 VITALS — BP 130/50 | HR 54 | Temp 97.8°F | Resp 18 | Ht 70.0 in | Wt 291.5 lb

## 2022-08-10 DIAGNOSIS — Z0000 Encounter for general adult medical examination without abnormal findings: Secondary | ICD-10-CM

## 2022-08-10 DIAGNOSIS — Z794 Long term (current) use of insulin: Secondary | ICD-10-CM | POA: Diagnosis not present

## 2022-08-10 DIAGNOSIS — E1159 Type 2 diabetes mellitus with other circulatory complications: Secondary | ICD-10-CM | POA: Diagnosis not present

## 2022-08-10 DIAGNOSIS — N1832 Chronic kidney disease, stage 3b: Secondary | ICD-10-CM | POA: Insufficient documentation

## 2022-08-10 DIAGNOSIS — N1831 Chronic kidney disease, stage 3a: Secondary | ICD-10-CM

## 2022-08-10 DIAGNOSIS — E1169 Type 2 diabetes mellitus with other specified complication: Secondary | ICD-10-CM | POA: Diagnosis not present

## 2022-08-10 DIAGNOSIS — D692 Other nonthrombocytopenic purpura: Secondary | ICD-10-CM

## 2022-08-10 DIAGNOSIS — E1122 Type 2 diabetes mellitus with diabetic chronic kidney disease: Secondary | ICD-10-CM | POA: Diagnosis not present

## 2022-08-10 DIAGNOSIS — R001 Bradycardia, unspecified: Secondary | ICD-10-CM

## 2022-08-10 DIAGNOSIS — E785 Hyperlipidemia, unspecified: Secondary | ICD-10-CM | POA: Diagnosis not present

## 2022-08-10 DIAGNOSIS — E118 Type 2 diabetes mellitus with unspecified complications: Secondary | ICD-10-CM

## 2022-08-10 DIAGNOSIS — E1142 Type 2 diabetes mellitus with diabetic polyneuropathy: Secondary | ICD-10-CM

## 2022-08-10 DIAGNOSIS — J9611 Chronic respiratory failure with hypoxia: Secondary | ICD-10-CM

## 2022-08-10 MED ORDER — GLIMEPIRIDE 2 MG PO TABS: 2.0000 mg | ORAL_TABLET | Freq: Every day | ORAL | 3 refills | Status: DC

## 2022-08-10 MED ORDER — ALLOPURINOL 300 MG PO TABS: 300.0000 mg | ORAL_TABLET | Freq: Every day | ORAL | 3 refills | Status: DC

## 2022-08-10 MED ORDER — CARVEDILOL 3.125 MG PO TABS: 3.1250 mg | ORAL_TABLET | Freq: Two times a day (BID) | ORAL | 3 refills | Status: DC

## 2022-08-10 NOTE — Assessment & Plan Note (Signed)
HR initially in the 40s in the setting of hypoxia Improved with O2 to the mid 50s Continue to monitor at home If dropping to the 40s regularly, will need to decrease BB dose

## 2022-08-10 NOTE — Assessment & Plan Note (Signed)
Discussed importance of healthy weight management Discussed diet and exercise  

## 2022-08-10 NOTE — Assessment & Plan Note (Signed)
Chronic and stable Recheck metabolic panel Avoid nephrotoxic meds  Followed by nephrology

## 2022-08-10 NOTE — Progress Notes (Signed)
I,[name],acting as a scribe for Shirlee Latch, MD.,have documented all relevant documentation on the behalf of Shirlee Latch, MD,as directed by  Shirlee Latch, MD while in the presence of Shirlee Latch, MD.    Annual Wellness Visit     Patient: Tony Williams, Male    DOB: [birth year], 77 y.o.   MRN: [id number] Visit Date: 08/10/2022  Today's Provider: Shirlee Latch, MD   Chief Complaint  Patient presents with   Annual Exam   Subjective    Tony Williams is a 77 y.o. male who presents today for his Annual Wellness Visit. He reports consuming a general diet. The patient does not participate in regular exercise at present. He generally feels fairly well. He reports sleeping well. He does not have additional problems to discuss today.   HPI   Medications: Outpatient Medications Prior to Visit  Medication Sig   acetaminophen (TYLENOL) 325 MG tablet Take 2 tablets (650 mg total) by mouth every 6 (six) hours as needed for mild pain (or Fever >/= 101).   albuterol (PROVENTIL) (2.5 MG/3ML) 0.083% nebulizer solution Take 3 mLs (2.5 mg total) by nebulization every 6 (six) hours as needed for wheezing or shortness of breath.   albuterol (VENTOLIN HFA) 108 (90 Base) MCG/ACT inhaler INHALE 2 PUFFS INTO THE LUNGS EVERY 6 HOURS AS NEEDED FOR WHEEZING OR SHORTNESS OF BREATH   amLODipine (NORVASC) 10 MG tablet TAKE 1 TABLET(10 MG) BY MOUTH DAILY   aspirin EC 81 MG tablet Take 81 mg by mouth daily. Swallow whole.   Budeson-Glycopyrrol-Formoterol (BREZTRI AEROSPHERE) 160-9-4.8 MCG/ACT AERO Inhale 2 puffs into the lungs 2 (two) times daily.   Cholecalciferol (VITAMIN D3 PO) Take 4,000 Units by mouth.   clopidogrel (PLAVIX) 75 MG tablet TAKE 1 TABLET(75 MG) BY MOUTH DAILY   Coenzyme Q10 (CO Q-10) 400 MG CAPS Take 400 mg by mouth daily at 6 (six) AM.   colchicine 0.6 MG tablet Take 1 tablet (0.6 mg total) by mouth 2 (two) times daily.   desoximetasone (TOPICORT) 0.25  % cream Apply 1 application. topically 2 (two) times daily.   escitalopram (LEXAPRO) 20 MG tablet TAKE 1 TABLET(20 MG) BY MOUTH DAILY   fenofibrate (TRICOR) 145 MG tablet Take 1 tablet (145 mg total) by mouth daily.   hydrALAZINE (APRESOLINE) 100 MG tablet TAKE 1 TABLET(100 MG) BY MOUTH THREE TIMES DAILY   HYDROcodone-acetaminophen (NORCO/VICODIN) 5-325 MG tablet Take 1 tablet by mouth every 6 (six) hours as needed for moderate pain.   isosorbide mononitrate (IMDUR) 30 MG 24 hr tablet Take 1 tablet (30 mg total) by mouth daily.   loratadine (CLARITIN) 10 MG tablet Take 1 tablet (10 mg total) by mouth daily.   lovastatin (MEVACOR) 40 MG tablet TAKE 1 TABLET(40 MG) BY MOUTH DAILY   Omega-3 Fatty Acids (FISH OIL) 1200 MG CAPS Take 1,200 mg by mouth daily at 6 (six) AM.   pregabalin (LYRICA) 100 MG capsule Take 1 capsule (100 mg total) by mouth 2 (two) times daily.   torsemide (DEMADEX) 20 MG tablet Take 1 tablet (20 mg total) by mouth daily as needed.   traZODone (DESYREL) 150 MG tablet TAKE 1 TABLET(150 MG) BY MOUTH AT BEDTIME   [DISCONTINUED] allopurinol (ZYLOPRIM) 100 MG tablet Take 1 tablet (100 mg total) by mouth daily.   [DISCONTINUED] allopurinol (ZYLOPRIM) 300 MG tablet Take 300 mg by mouth daily.   [DISCONTINUED] carvedilol (COREG) 3.125 MG tablet Take 1 tablet (3.125 mg total) by mouth 2 (two) times daily.   [  DISCONTINUED] glimepiride (AMARYL) 2 MG tablet Take 1 tablet (2 mg total) by mouth daily with breakfast. TAKE 1 TABLET(1 MG) BY MOUTH DAILY WITH BREAKFAST   nitroGLYCERIN (NITROSTAT) 0.4 MG SL tablet Place 1 tablet (0.4 mg total) under the tongue every 5 (five) minutes as needed for chest pain.   No facility-administered medications prior to visit.    Allergies  Allergen Reactions   Dilaudid [Hydromorphone] Hives    Rash and itch   Butorphanol Itching   Morphine Other (See Comments)    Redness around IV site   Pedi-Pre Tape Spray [Wound Dressing Adhesive] Itching   Statins  Other (See Comments)    "Destroyed my muscles"   Zocor [Simvastatin] Other (See Comments)    "Destroyed my muscles"    Patient Care Team: Erasmo Downer, MD as PCP - General (Family Medicine) Debbe Odea, MD as PCP - Cardiology (Cardiology) Debbe Odea, MD as Consulting Physician (Cardiology) Coralyn Helling, MD as Consulting Physician (Pulmonary Disease)  Review of Systems  All other systems reviewed and are negative.   Last CBC Lab Results  Component Value Date   WBC 8.5 12/28/2021   HGB 14.2 12/28/2021   HCT 45.0 12/28/2021   MCV 92.0 12/28/2021   MCH 29.0 12/28/2021   RDW 14.2 12/28/2021   PLT 258 12/28/2021   Last metabolic panel Lab Results  Component Value Date   GLUCOSE 161 (H) 12/28/2021   NA 141 12/28/2021   K 5.3 (H) 12/28/2021   CL 106 12/28/2021   CO2 26 12/28/2021   BUN 35 (H) 12/28/2021   CREATININE 2.23 (H) 12/28/2021   GFRNONAA 30 (L) 12/28/2021   CALCIUM 9.5 12/28/2021   PHOS 3.4 11/09/2021   PROT 7.4 12/28/2021   ALBUMIN 3.8 12/28/2021   LABGLOB 2.8 08/06/2021   AGRATIO 1.5 08/06/2021   BILITOT 0.5 12/28/2021   ALKPHOS 42 12/28/2021   AST 20 12/28/2021   ALT 23 12/28/2021   ANIONGAP 9 12/28/2021   Last lipids Lab Results  Component Value Date   CHOL 172 12/28/2021   HDL 37 (L) 12/28/2021   LDLCALC 98 12/28/2021   TRIG 184 (H) 12/28/2021   CHOLHDL 4.6 12/28/2021   Last hemoglobin A1c Lab Results  Component Value Date   HGBA1C 7.4 (A) 05/13/2022   Last thyroid functions Lab Results  Component Value Date   TSH 2.515 12/08/2020   Last vitamin D Lab Results  Component Value Date   VD25OH 38.8 04/22/2020        Objective    Vitals: BP (!) 130/50 (BP Location: Left Arm, Patient Position: Sitting, Cuff Size: Large)   Pulse (!) 54   Temp 97.8 F (36.6 C) (Oral)   Resp 18   Ht 5\' 10"  (1.778 m)   Wt 291 lb 8 oz (132.2 kg)   SpO2 92% Comment: 2 Liter of oxygen  BMI 41.83 kg/m  BP Readings from Last 3  Encounters:  08/10/22 (!) 130/50  08/04/22 136/80  06/29/22 (!) 162/76   Wt Readings from Last 3 Encounters:  08/10/22 291 lb 8 oz (132.2 kg)  08/04/22 291 lb 12.8 oz (132.4 kg)  06/29/22 292 lb 3.2 oz (132.5 kg)      Physical Exam Vitals reviewed.  Constitutional:      General: He is not in acute distress.    Appearance: Normal appearance. He is well-developed. He is not diaphoretic.  HENT:     Head: Normocephalic and atraumatic.     Right Ear: Tympanic membrane, ear  canal and external ear normal.     Left Ear: Tympanic membrane, ear canal and external ear normal.     Nose: Nose normal.     Mouth/Throat:     Mouth: Mucous membranes are moist.     Pharynx: Oropharynx is clear. No oropharyngeal exudate.  Eyes:     General: No scleral icterus.    Conjunctiva/sclera: Conjunctivae normal.     Pupils: Pupils are equal, round, and reactive to light.  Neck:     Thyroid: No thyromegaly.  Cardiovascular:     Rate and Rhythm: Regular rhythm. Bradycardia present.     Heart sounds: Normal heart sounds. No murmur heard. Pulmonary:     Effort: Pulmonary effort is normal. No respiratory distress.     Breath sounds: Normal breath sounds. No wheezing or rales.  Abdominal:     General: There is no distension.     Palpations: Abdomen is soft.     Tenderness: There is no abdominal tenderness.  Musculoskeletal:        General: No deformity.     Cervical back: Neck supple.     Right lower leg: Edema (trace) present.     Left lower leg: Edema (trace) present.  Lymphadenopathy:     Cervical: No cervical adenopathy.  Skin:    General: Skin is warm and dry.     Findings: No rash.  Neurological:     Mental Status: He is alert and oriented to person, place, and time. Mental status is at baseline.     Gait: Gait normal.  Psychiatric:        Mood and Affect: Mood normal.        Behavior: Behavior normal.        Thought Content: Thought content normal.      Most recent functional  status assessment:    08/10/2022    2:20 PM  In your present state of health, do you have any difficulty performing the following activities:  Hearing? 0  Vision? 1  Difficulty concentrating or making decisions? 0  Walking or climbing stairs? 1  Dressing or bathing? 0  Doing errands, shopping? 0   Most recent fall risk assessment:    08/10/2022    2:19 PM  Fall Risk   Falls in the past year? 0  Number falls in past yr: 0  Injury with Fall? 0  Risk for fall due to : No Fall Risks    Most recent depression screenings:    08/10/2022    2:19 PM 05/31/2022    1:40 PM  PHQ 2/9 Scores  PHQ - 2 Score 2 0  PHQ- 9 Score 4 3   Most recent cognitive screening:     No data to display         Most recent Audit-C alcohol use screening    08/10/2022    2:20 PM  Alcohol Use Disorder Test (AUDIT)  1. How often do you have a drink containing alcohol? 0  2. How many drinks containing alcohol do you have on a typical day when you are drinking? 0  3. How often do you have six or more drinks on one occasion? 0  AUDIT-C Score 0   A score of 3 or more in women, and 4 or more in men indicates increased risk for alcohol abuse, EXCEPT if all of the points are from question 1   No results found for any visits on 08/10/22.  Assessment & Plan     Annual  wellness visit done today including the all of the following: Reviewed patient's Family Medical History Reviewed and updated list of patient's medical providers Assessment of cognitive impairment was done Assessed patient's functional ability Established a written schedule for health screening services Health Risk Assessent Completed and Reviewed  Exercise Activities and Dietary recommendations  Goals      DIET - EAT MORE FRUITS AND VEGETABLES     DIET - INCREASE WATER INTAKE     Recommend to drink at least 6-8 8oz glasses of water per day.     Manage My Emotions     Timeframe:  Long-Range Goal Priority:  Medium Start Date:      02/26/21                        Expected End Date:    04/21/21                   Follow Up Date 04/21/21    - begin personal counseling - practice relaxation or meditation daily - talk about feelings with a friend, family or spiritual advisor - practice positive thinking and self-talk  -Continue to consider follow up with [facility] if needed   Why is this important?   When you are stressed, down or upset, your body reacts too.  For example, your blood pressure may get higher; you may have a headache or stomachache.  When your emotions get the best of you, your body's ability to fight off cold and flu gets weak.  These steps will help you manage your emotions.     Notes:         Immunization History  Administered Date(s) Administered   Fluad Quad(high Dose 65+) 12/10/2020, 01/26/2022   Influenza, High Dose Seasonal PF 01/13/2020   Moderna Sars-Covid-2 Vaccination 04/05/2019, 05/03/2019, 01/10/2020   Pneumococcal Conjugate-13 10/10/2014   Pneumococcal Polysaccharide-23 12/29/2018, 07/21/2020   Td 05/10/2017    Health Maintenance  Topic Date Due   Zoster Vaccines- Shingrix (1 of 2) Never done   Diabetic kidney evaluation - Urine ACR  08/07/2022   COVID-19 Vaccine (4 - 2023-24 season) 08/20/2022 (Originally 11/13/2021)   OPHTHALMOLOGY EXAM  09/01/2022   INFLUENZA VACCINE  10/14/2022   HEMOGLOBIN A1C  11/11/2022   Diabetic kidney evaluation - eGFR measurement  12/29/2022   FOOT EXAM  08/10/2023   Medicare Annual Wellness (AWV)  08/10/2023   DTaP/Tdap/Td (2 - Tdap) 05/11/2027   Colonoscopy  09/24/2030   Pneumonia Vaccine 75+ Years old  Completed   Hepatitis C Screening  Completed   HPV VACCINES  Aged Out     Discussed health benefits of physical activity, and encouraged him to engage in regular exercise appropriate for his age and condition.    Problem List Items Addressed This Visit       Cardiovascular and Mediastinum   Hypertension associated  with diabetes (HCC)    Well controlled Continue current medications Recheck metabolic panel F/u in 6 months       Relevant Medications   carvedilol (COREG) 3.125 MG tablet   glimepiride (AMARYL) 2 MG tablet   Other Relevant Orders   Comprehensive metabolic panel   Symptomatic sinus bradycardia    HR initially in the 40s in the setting of hypoxia Improved with O2 to the mid 50s Continue to monitor at home If dropping to the 40s regularly, will need to decrease BB dose      Relevant Medications   carvedilol (  COREG) 3.125 MG tablet   Senile purpura (HCC)    Chronic and stable      Relevant Medications   carvedilol (COREG) 3.125 MG tablet     Respiratory   Chronic respiratory failure with hypoxia (HCC)    Multifactorial related to OSA, COPD, CHF Continue home O2 He was initially short of breath with an O2 sat of 89% on room air upon arrival, but quickly improved with 2 L of O2 Has home O2 in the car which he will resume        Endocrine   T2DM (type 2 diabetes mellitus) (HCC)    Chronic and uncontrolled on last A1c Recheck A1c Continue current meds in the interim Possible dose changes pending A1c results UACR today Foot exam today Follow-up in 3 months      Relevant Medications   glimepiride (AMARYL) 2 MG tablet   Other Relevant Orders   Urine microalbumin-creatinine with uACR   Hemoglobin A1c   Diabetic polyneuropathy associated with type 2 diabetes mellitus (HCC)    Chronic and stable Continue Lyrica at current dose      Relevant Medications   glimepiride (AMARYL) 2 MG tablet   Hyperlipidemia associated with type 2 diabetes mellitus (HCC)    Previously well controlled Continue statin Repeat FLP and CMP Goal LDL < 70       Relevant Medications   carvedilol (COREG) 3.125 MG tablet   glimepiride (AMARYL) 2 MG tablet   Other Relevant Orders   Lipid panel   Comprehensive metabolic panel     Genitourinary   Stage 3b chronic kidney disease (CKD) (HCC)     Chronic and stable Recheck metabolic panel Avoid nephrotoxic meds  Followed by nephrology      Relevant Orders   Comprehensive metabolic panel     Other   Morbid obesity (HCC)    Discussed importance of healthy weight management Discussed diet and exercise       Relevant Medications   glimepiride (AMARYL) 2 MG tablet   Other Visit Diagnoses     Encounter for annual wellness visit (AWV) in Medicare patient    -  Primary   Encounter for annual physical exam       Relevant Orders   Urine microalbumin-creatinine with uACR   Lipid panel   Hemoglobin A1c   Comprehensive metabolic panel   Controlled type 2 diabetes mellitus with complication, without long-term current use of insulin (HCC)       Relevant Medications   glimepiride (AMARYL) 2 MG tablet   Stage 3a chronic kidney disease (HCC)       Relevant Medications   glimepiride (AMARYL) 2 MG tablet   Other Relevant Orders   Comprehensive metabolic panel        Return in about 3 months (around 11/10/2022) for chronic disease f/u.     I, Shirlee Latch, MD, have reviewed all documentation for this visit. The documentation on 08/10/22 for the exam, diagnosis, procedures, and orders are all accurate and complete.   Alane Hanssen, Marzella Schlein, MD, MPH [facility]

## 2022-08-10 NOTE — Assessment & Plan Note (Signed)
Chronic and stable Continue Lyrica at current dose

## 2022-08-10 NOTE — Assessment & Plan Note (Signed)
Previously well controlled Continue statin Repeat FLP and CMP Goal LDL < 70 

## 2022-08-10 NOTE — Assessment & Plan Note (Signed)
Multifactorial related to OSA, COPD, CHF Continue home O2 He was initially short of breath with an O2 sat of 89% on room air upon arrival, but quickly improved with 2 L of O2 Has home O2 in the car which he will resume

## 2022-08-10 NOTE — Assessment & Plan Note (Signed)
Chronic and stable.   

## 2022-08-10 NOTE — Assessment & Plan Note (Signed)
Well controlled Continue current medications Recheck metabolic panel F/u in 6 months  

## 2022-08-10 NOTE — Assessment & Plan Note (Signed)
Chronic and uncontrolled on last A1c Recheck A1c Continue current meds in the interim Possible dose changes pending A1c results UACR today Foot exam today Follow-up in 3 months

## 2022-08-11 LAB — COMPREHENSIVE METABOLIC PANEL
ALT: 14 IU/L (ref 0–44)
AST: 12 IU/L (ref 0–40)
Albumin/Globulin Ratio: 1.5 (ref 1.2–2.2)
Albumin: 4.2 g/dL (ref 3.8–4.8)
Alkaline Phosphatase: 56 IU/L (ref 44–121)
BUN/Creatinine Ratio: 19 (ref 10–24)
BUN: 40 mg/dL — ABNORMAL HIGH (ref 8–27)
Bilirubin Total: 0.3 mg/dL (ref 0.0–1.2)
CO2: 25 mmol/L (ref 20–29)
Calcium: 9.6 mg/dL (ref 8.6–10.2)
Chloride: 100 mmol/L (ref 96–106)
Creatinine, Ser: 2.13 mg/dL — ABNORMAL HIGH (ref 0.76–1.27)
Globulin, Total: 2.8 g/dL (ref 1.5–4.5)
Glucose: 93 mg/dL (ref 70–99)
Potassium: 5.1 mmol/L (ref 3.5–5.2)
Sodium: 141 mmol/L (ref 134–144)
Total Protein: 7 g/dL (ref 6.0–8.5)
eGFR: 32 mL/min/{1.73_m2} — ABNORMAL LOW (ref >59)

## 2022-08-11 LAB — MICROALBUMIN / CREATININE URINE RATIO
Creatinine, Urine: 50 mg/dL
Microalb/Creat Ratio: 173 mg/g creat — ABNORMAL HIGH (ref 0–29)
Microalbumin, Urine: 86.3 ug/mL

## 2022-08-11 LAB — LIPID PANEL
Chol/HDL Ratio: 5.1 ratio — ABNORMAL HIGH (ref 0.0–5.0)
Cholesterol, Total: 172 mg/dL (ref 100–199)
HDL: 34 mg/dL — ABNORMAL LOW (ref >39)
LDL Chol Calc (NIH): 108 mg/dL — ABNORMAL HIGH (ref 0–99)
Triglycerides: 172 mg/dL — ABNORMAL HIGH (ref 0–149)
VLDL Cholesterol Cal: 30 mg/dL (ref 5–40)

## 2022-08-11 LAB — HEMOGLOBIN A1C
Est. average glucose Bld gHb Est-mCnc: 146 mg/dL
Hgb A1c MFr Bld: 6.7 % — ABNORMAL HIGH (ref 4.8–5.6)

## 2022-08-16 ENCOUNTER — Other Ambulatory Visit: Payer: Self-pay | Admitting: Family Medicine

## 2022-08-16 NOTE — Telephone Encounter (Signed)
Medication Refill - Medication: HYDROcodone-acetaminophen (NORCO/VICODIN) 5-325 MG tablet   Has the patient contacted their pharmacy? No.   Preferred Pharmacy (with phone number or street name):  [id number] - [place], [place] - [address] Phone: [phone]  Fax: [phone]     Has the patient been seen for an appointment in the last year OR does the patient have an upcoming appointment? Yes.    Please assist patient further   

## 2022-08-17 MED ORDER — HYDROCODONE-ACETAMINOPHEN 5-325 MG PO TABS: 1.0000 | ORAL_TABLET | Freq: Four times a day (QID) | ORAL | 0 refills | Status: DC | PRN

## 2022-08-17 NOTE — Telephone Encounter (Signed)
Requested medication (s) are due for refill today: Yes  Requested medication (s) are on the active medication list: Yes  Last refill:  07/06/22  Future visit scheduled: Yes  Notes to clinic:  See request.    Requested Prescriptions  Pending Prescriptions Disp Refills   HYDROcodone-acetaminophen (NORCO/VICODIN) 5-325 MG tablet 30 tablet 0    Sig: Take 1 tablet by mouth every 6 (six) hours as needed for moderate pain.     Not Delegated - Analgesics:  Opioid Agonist Combinations Failed - 08/16/2022  1:37 PM      Failed - This refill cannot be delegated      Failed - Urine Drug Screen completed in last 360 days      Passed - Valid encounter within last 3 months    Recent Outpatient Visits           1 week ago Encounter for annual wellness visit (AWV) in Medicare patient   [place], Marzella Schlein, MD   2 months ago Acute gout due to renal impairment involving toe of left foot   [facility] Merita Norton T, FNP   3 months ago Diabetic polyneuropathy associated with type 2 diabetes mellitus [facility])   [facility] [place], Marzella Schlein, MD   4 months ago Infected sebaceous cyst   [facility] Erasmo Downer, MD   6 months ago Hypertension associated with diabetes [facility])    [facility] [place]ig[place], Marzella Schlein, MD       Future Appointments             In 1 month Agbor-Etang, Arlys John, MD [facility] [place]   In 2 months Bac[place]alupo, Marzella Schlein, MD [facility]

## 2022-08-20 ENCOUNTER — Other Ambulatory Visit: Payer: Self-pay | Admitting: Obstetrics and Gynecology

## 2022-08-26 DIAGNOSIS — J449 Chronic obstructive pulmonary disease, unspecified: Secondary | ICD-10-CM | POA: Diagnosis not present

## 2022-08-30 DIAGNOSIS — G4733 Obstructive sleep apnea (adult) (pediatric): Secondary | ICD-10-CM | POA: Diagnosis not present

## 2022-08-30 IMAGING — DX DG CHEST 1V PORT
1 series · 1 of 1 positions shown · non-contrast
Comparison: 11/08/2020

CLINICAL DATA: Intra-aortic balloon pump

EXAM:
PORTABLE CHEST 1 VIEW

[chest]
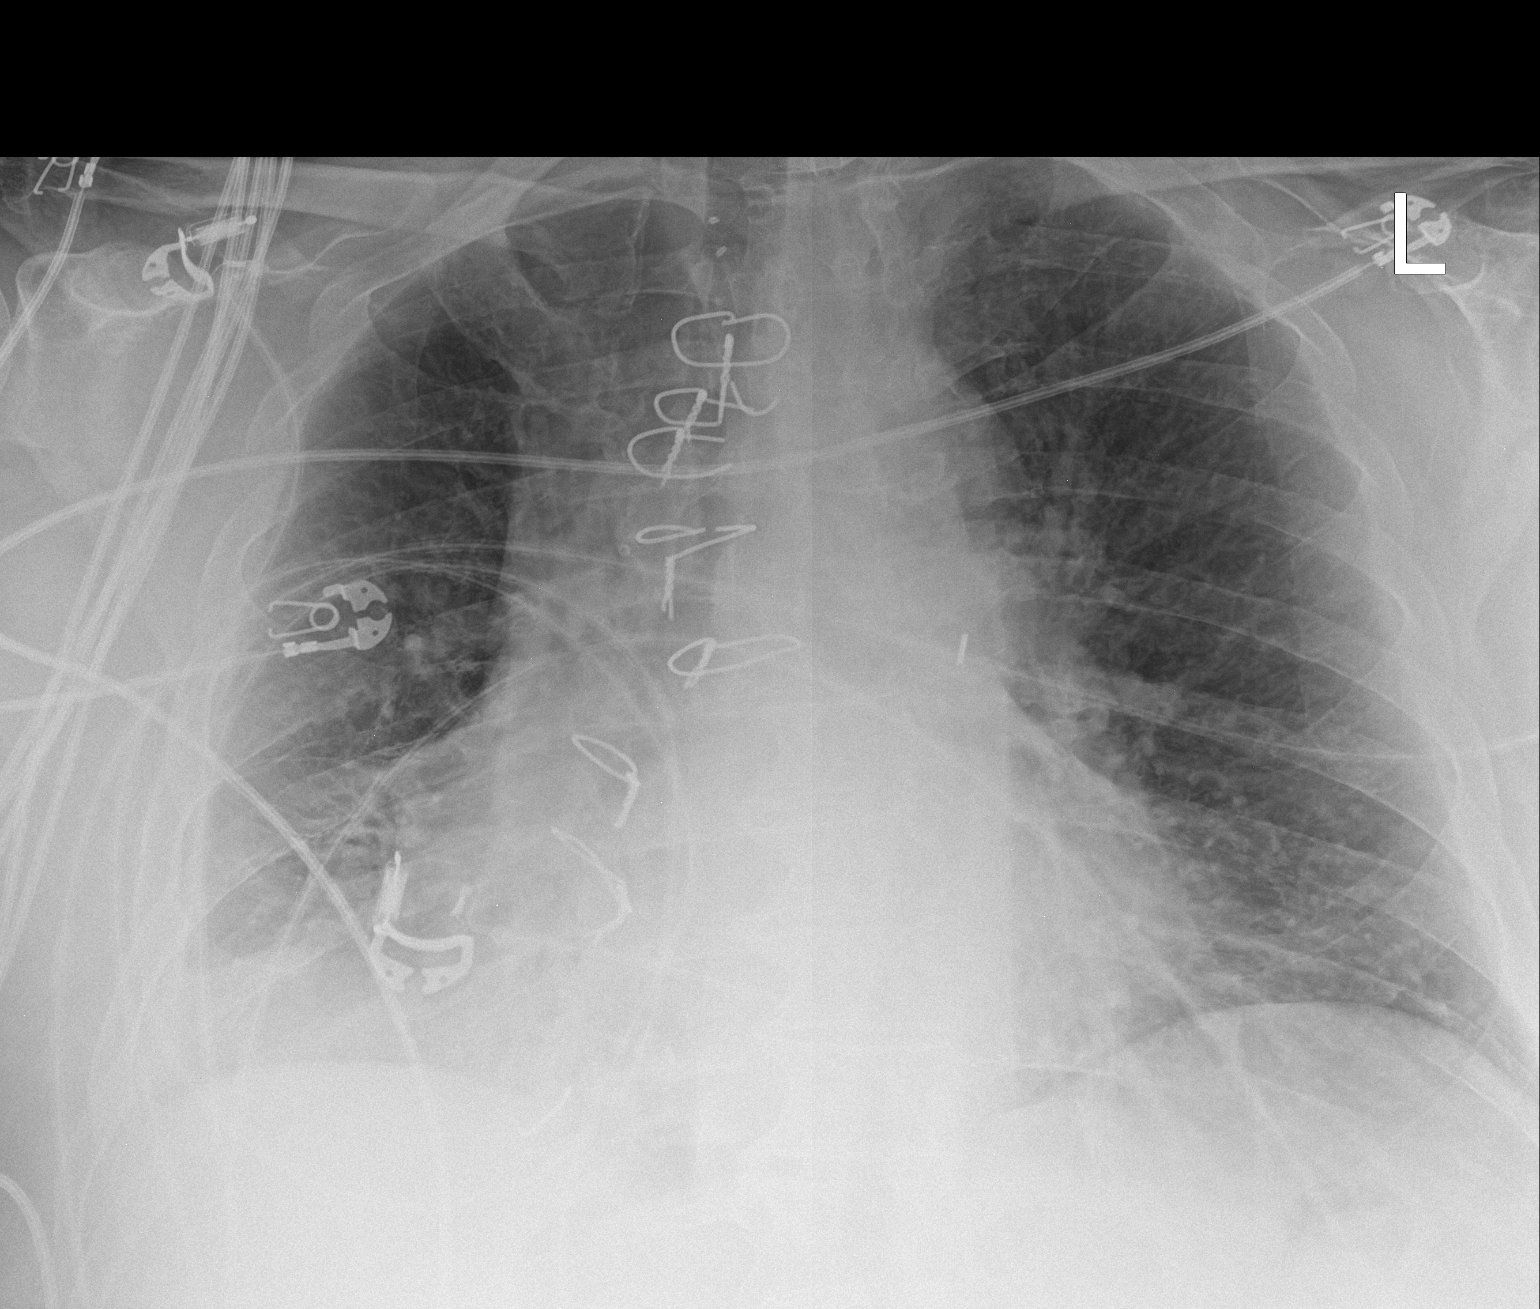

[1 of 1 positions shown; findings below may reference images not displayed]

FINDINGS: Post sternotomy changes with redemonstrated fractures through
multiple sternal wires. Cardiomegaly with vascular congestion and
mild pulmonary edema. Small right-sided pleural effusion. Interim
placement of intra-aortic balloon pump, slightly low positioning of
radiopaque tip which is visible about 6 cm inferior to the aortic
arch. No pneumothorax is seen
IMPRESSION: 1. Low positioning of intra-aortic balloon pump with radiopaque tip
about 6 cm inferior to the aortic arch
2. Cardiomegaly with vascular congestion, pulmonary edema and small
right pleural effusion

These results will be called to the ordering clinician or
representative by the Radiologist Assistant, and communication
documented in the PACS or [REDACTED].

## 2022-08-31 ENCOUNTER — Encounter: Payer: Self-pay | Admitting: Emergency Medicine

## 2022-08-31 ENCOUNTER — Emergency Department
Admission: EM | Admit: 2022-08-31 | Discharge: 2022-08-31 | Disposition: A | Discharge status: Home / Self Care | Payer: Medicare Other | Attending: Emergency Medicine | Admitting: Emergency Medicine

## 2022-08-31 ENCOUNTER — Other Ambulatory Visit: Payer: Self-pay

## 2022-08-31 ENCOUNTER — Emergency Department: Payer: Medicare Other

## 2022-08-31 DIAGNOSIS — R300 Dysuria: Secondary | ICD-10-CM | POA: Insufficient documentation

## 2022-08-31 DIAGNOSIS — N1832 Chronic kidney disease, stage 3b: Secondary | ICD-10-CM | POA: Insufficient documentation

## 2022-08-31 DIAGNOSIS — E1122 Type 2 diabetes mellitus with diabetic chronic kidney disease: Secondary | ICD-10-CM | POA: Insufficient documentation

## 2022-08-31 DIAGNOSIS — R1032 Left lower quadrant pain: Secondary | ICD-10-CM | POA: Insufficient documentation

## 2022-08-31 DIAGNOSIS — Z794 Long term (current) use of insulin: Secondary | ICD-10-CM | POA: Diagnosis not present

## 2022-08-31 DIAGNOSIS — I129 Hypertensive chronic kidney disease with stage 1 through stage 4 chronic kidney disease, or unspecified chronic kidney disease: Secondary | ICD-10-CM | POA: Insufficient documentation

## 2022-08-31 DIAGNOSIS — R109 Unspecified abdominal pain: Secondary | ICD-10-CM

## 2022-08-31 DIAGNOSIS — E119 Type 2 diabetes mellitus without complications: Secondary | ICD-10-CM | POA: Diagnosis not present

## 2022-08-31 DIAGNOSIS — I2510 Atherosclerotic heart disease of native coronary artery without angina pectoris: Secondary | ICD-10-CM | POA: Insufficient documentation

## 2022-08-31 DIAGNOSIS — R110 Nausea: Secondary | ICD-10-CM | POA: Diagnosis not present

## 2022-08-31 DIAGNOSIS — K5730 Diverticulosis of large intestine without perforation or abscess without bleeding: Secondary | ICD-10-CM | POA: Diagnosis not present

## 2022-08-31 DIAGNOSIS — G4733 Obstructive sleep apnea (adult) (pediatric): Secondary | ICD-10-CM | POA: Diagnosis not present

## 2022-08-31 LAB — CBC
HCT: 41.2 % (ref 39.0–52.0)
Hemoglobin: 12.7 g/dL — ABNORMAL LOW (ref 13.0–17.0)
MCH: 27.4 pg (ref 26.0–34.0)
MCHC: 30.8 g/dL (ref 30.0–36.0)
MCV: 89 fL (ref 80.0–100.0)
Platelets: 203 10*3/uL (ref 150–400)
RBC: 4.63 MIL/uL (ref 4.22–5.81)
RDW: 16.7 % — ABNORMAL HIGH (ref 11.5–15.5)
WBC: 6 10*3/uL (ref 4.0–10.5)
nRBC: 0 % (ref 0.0–0.2)

## 2022-08-31 LAB — BASIC METABOLIC PANEL
Anion gap: 11 (ref 5–15)
BUN: 38 mg/dL — ABNORMAL HIGH (ref 8–23)
CO2: 25 mmol/L (ref 22–32)
Calcium: 8.9 mg/dL (ref 8.9–10.3)
Chloride: 101 mmol/L (ref 98–111)
Creatinine, Ser: 2.12 mg/dL — ABNORMAL HIGH (ref 0.61–1.24)
GFR, Estimated: 32 mL/min — ABNORMAL LOW (ref >60)
Glucose, Bld: 121 mg/dL — ABNORMAL HIGH (ref 70–99)
Potassium: 3.9 mmol/L (ref 3.5–5.1)
Sodium: 137 mmol/L (ref 135–145)

## 2022-08-31 LAB — URINALYSIS, ROUTINE W REFLEX MICROSCOPIC
Bilirubin Urine: NEGATIVE
Glucose, UA: NEGATIVE mg/dL
Hgb urine dipstick: NEGATIVE
Ketones, ur: NEGATIVE mg/dL
Leukocytes,Ua: NEGATIVE
Nitrite: NEGATIVE
Protein, ur: NEGATIVE mg/dL
Specific Gravity, Urine: 1.01 (ref 1.005–1.030)
pH: 5 (ref 5.0–8.0)

## 2022-08-31 MED ORDER — FENTANYL CITRATE PF 50 MCG/ML IJ SOSY
100.0000 ug | PREFILLED_SYRINGE | Freq: Once | INTRAMUSCULAR | Status: AC
  Administered 2022-08-31: 100 ug via INTRAVENOUS
  Filled 2022-08-31: qty 2

## 2022-08-31 MED ORDER — OXYCODONE-ACETAMINOPHEN 5-325 MG PO TABS: 1.0000 | ORAL_TABLET | Freq: Four times a day (QID) | ORAL | 0 refills | Status: AC | PRN

## 2022-08-31 MED ORDER — FENTANYL CITRATE PF 50 MCG/ML IJ SOSY
100.0000 ug | PREFILLED_SYRINGE | Freq: Once | INTRAMUSCULAR | Status: AC
  Administered 2022-08-31: 100 ug via INTRAMUSCULAR
  Filled 2022-08-31: qty 2

## 2022-08-31 NOTE — ED Provider Notes (Signed)
[facility] Provider Note    Event Date/Time   First MD Initiated Contact with Patient 08/31/22 [phone]     (approximate)   History   Chief Complaint: Flank Pain   HPI  Tony Williams is a 77 y.o. male with a history of diabetes, CAD, hypertension, morbid obesity who comes the ED complaining of left flank pain for the last 4 days.  Worsening, constant, no aggravating or alleviating factors, radiates around the left lower quadrant.  Associated with nausea and dysuria.  Feels like kidney stone that he had in the past.     Physical Exam   Triage Vital Signs: ED Triage Vitals  Enc Vitals Group     BP 08/31/22 0730 (!) 146/55     Pulse Rate 08/31/22 0730 (!) 55     Resp 08/31/22 0728 20     Temp 08/31/22 0728 97.7 F (36.5 C)     Temp src --      SpO2 08/31/22 0728 92 %     Weight 08/31/22 0727 290 lb (131.5 kg)     Height 08/31/22 0727 5\' 10"  (1.778 m)     Head Circumference --      Peak Flow --      Pain Score 08/31/22 0727 7     Pain Loc --      Pain Edu? --      Excl. in GC? --     Most recent vital signs: Vitals:   08/31/22 0728 08/31/22 0730  BP:  (!) 146/55  Pulse:  (!) 55  Resp: 20   Temp: 97.7 F (36.5 C)   SpO2: 92%     General: Awake, no distress.  CV:  Good peripheral perfusion.  Regular rate and rhythm, normal distal pulses Resp:  Normal effort.  Clear to auscultation bilaterally Abd:  No distention.  Soft with left lower quadrant tenderness Other:  Moist oral mucosa   ED Results / Procedures / Treatments   Labs (all labs ordered are listed, but only abnormal results are displayed) Labs Reviewed  URINALYSIS, ROUTINE W REFLEX MICROSCOPIC - Abnormal; Notable for the following components:      Result Value   Color, Urine STRAW (*)    APPearance CLEAR (*)    All other components within normal limits  CBC - Abnormal; Notable for the following components:   Hemoglobin 12.7 (*)    RDW 16.7 (*)    All other components  within normal limits  BASIC METABOLIC PANEL - Abnormal; Notable for the following components:   Glucose, Bld 121 (*)    BUN 38 (*)    Creatinine, Ser 2.12 (*)    GFR, Estimated 32 (*)    All other components within normal limits     EKG    RADIOLOGY CT abdomen pelvis interpreted by me, negative for free air, bowel obstruction, diverticulitis, or ureterolithiasis.  Radiology report reviewed.   PROCEDURES:  Procedures   MEDICATIONS ORDERED IN ED: Medications  fentaNYL (SUBLIMAZE) injection 100 mcg (100 mcg Intramuscular Given 08/31/22 0837)  fentaNYL (SUBLIMAZE) injection 100 mcg (100 mcg Intravenous Given 08/31/22 0931)     IMPRESSION / MDM / ASSESSMENT AND PLAN / ED COURSE  I reviewed the triage vital signs and the nursing notes.  DDx: Ureterolithiasis, diverticulitis, cystitis, electrolyte abnormality, AKI, UTI  Patient's presentation is most consistent with acute presentation with potential threat to life or bodily function.  Patient presents with left flank pain, radiating to left lower quadrant.  Vital signs unremarkable.  Has tenderness on exam corresponding to his complaints.  Labs are at baseline, UA without signs of infection.  Will give IM fentanyl due to his prior intolerance of morphine and Dilaudid and obtain CT abdomen pelvis.  Doubt mesenteric ischemia, dissection, AAA.   Clinical Course as of 08/31/22 1000  Tue Aug 31, 2022  1610 Still reporting severe pain - will start IV and give IV fentanyl [PS]    Clinical Course User Index [PS] Sharman Cheek, MD    ----------------------------------------- 10:00 AM on 08/31/2022 ----------------------------------------- Feeling better.  CT is unremarkable.  Patient counseled on incidentally noted renal cyst and will follow-up with his nephrologist.   FINAL CLINICAL IMPRESSION(S) / ED DIAGNOSES   Final diagnoses:  Left flank pain  Stage 3b chronic kidney disease (HCC)  Type 2 diabetes mellitus  without complication, without long-term current use of insulin (HCC)     Rx / DC Orders   ED Discharge Orders          Ordered    oxyCODONE-acetaminophen (PERCOCET) 5-325 MG tablet  Every 6 hours PRN        08/31/22 0955             Note:  This document was prepared using Dragon voice recognition software and may include unintentional dictation errors.   Sharman Cheek, MD 08/31/22 1000

## 2022-08-31 NOTE — ED Triage Notes (Signed)
Pt to ED for left flank pain for 3-4 days. +nausea. +burning with urination Pt wears 3 L [place]

## 2022-08-31 NOTE — Discharge Instructions (Signed)
Your lab tests and CT scan today did not show any acute cause of your pain.  Please follow-up with Dr. Thedore Mins or your primary care doctor for further evaluation of the incidentally noted cysts on your kidney.

## 2022-08-31 NOTE — ED Notes (Signed)
Pt to CT

## 2022-09-01 ENCOUNTER — Ambulatory Visit: Payer: Medicare Other | Admitting: Cardiology

## 2022-09-03 IMAGING — CR DG CHEST 2V
2 series · 2 of 2 positions shown · non-contrast
Comparison: Radiograph 11/11/2020, CT 11/08/2020

CLINICAL DATA: Shortness of breath.  Hospital discharge yesterday.

EXAM:
CHEST - 2 VIEW

[chest lat]
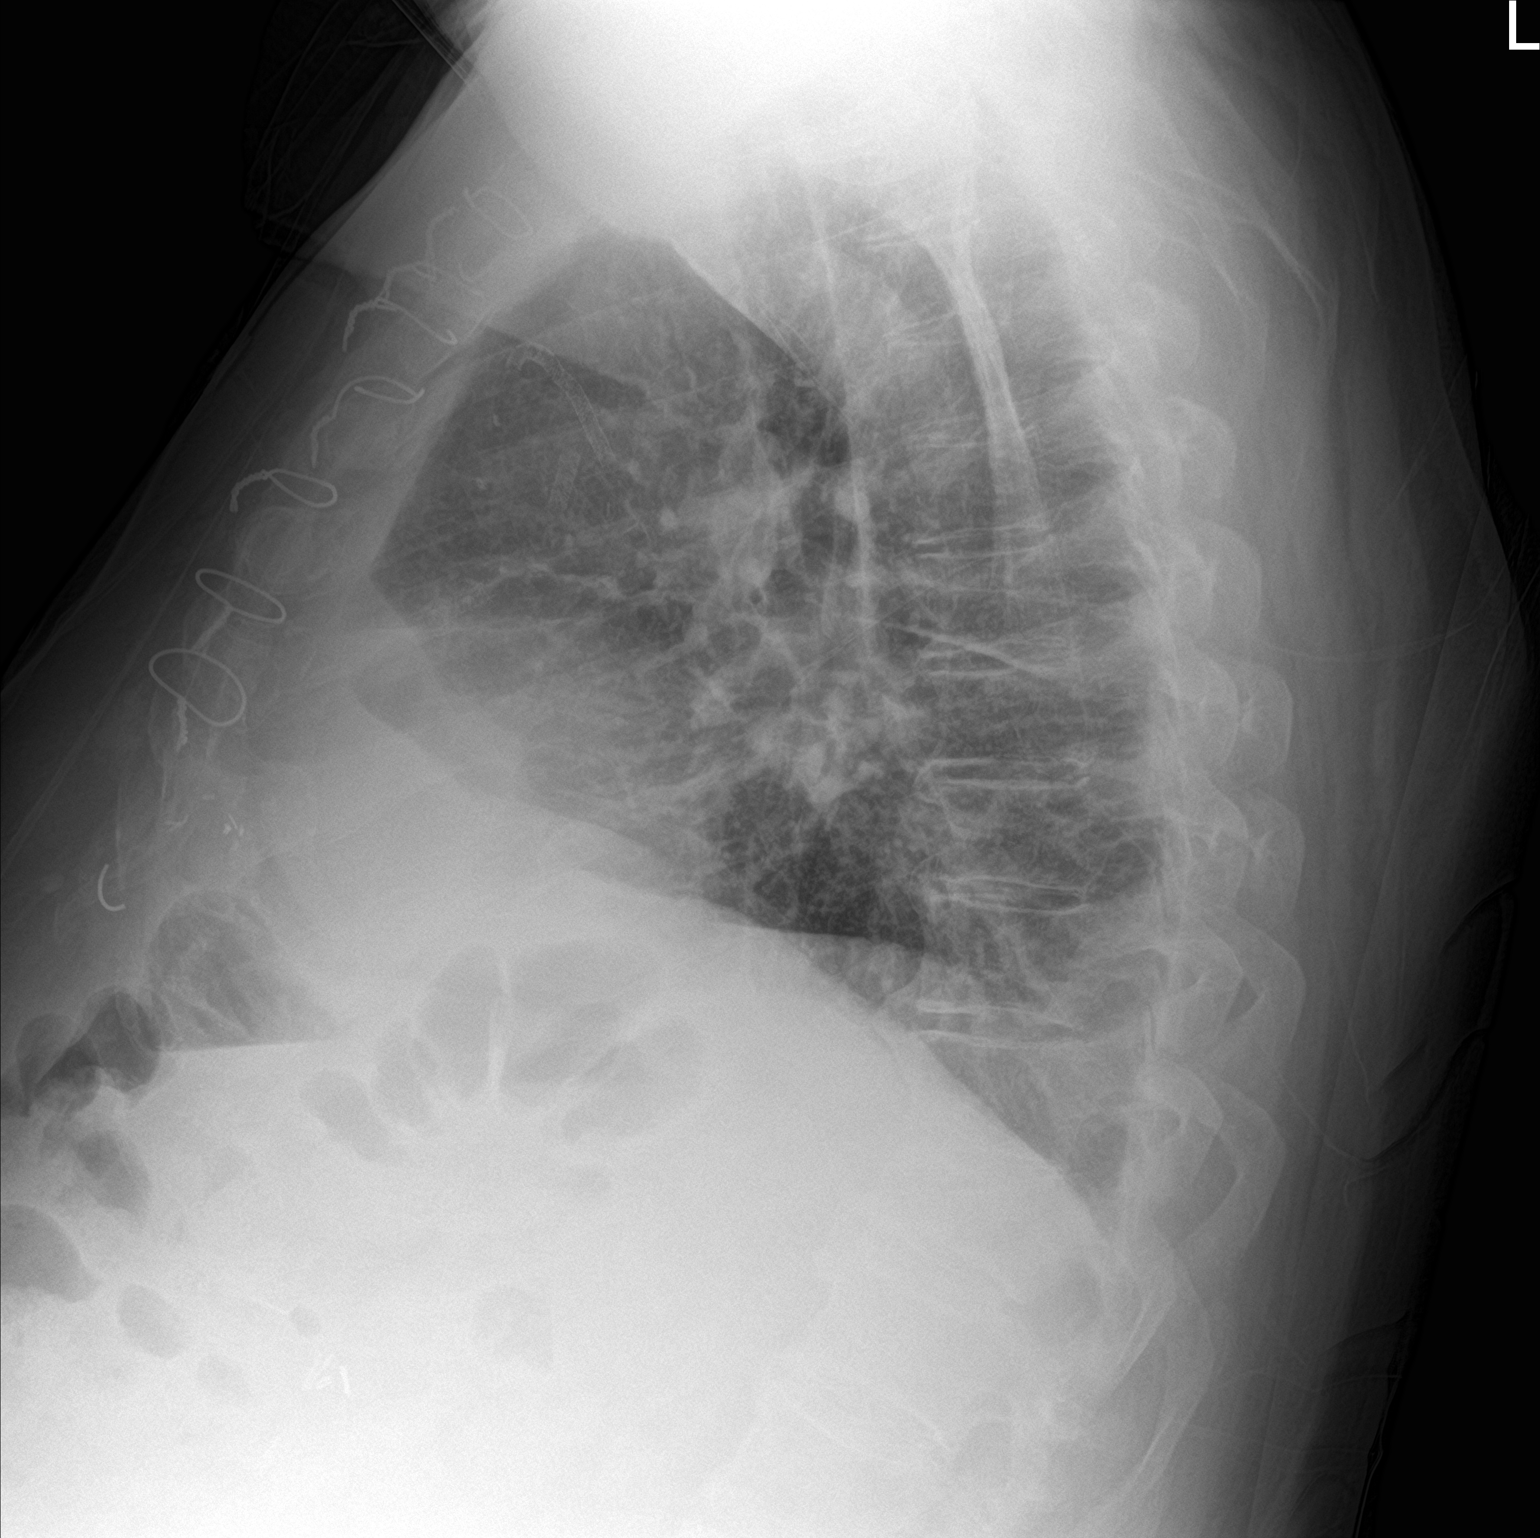

[chest ap]
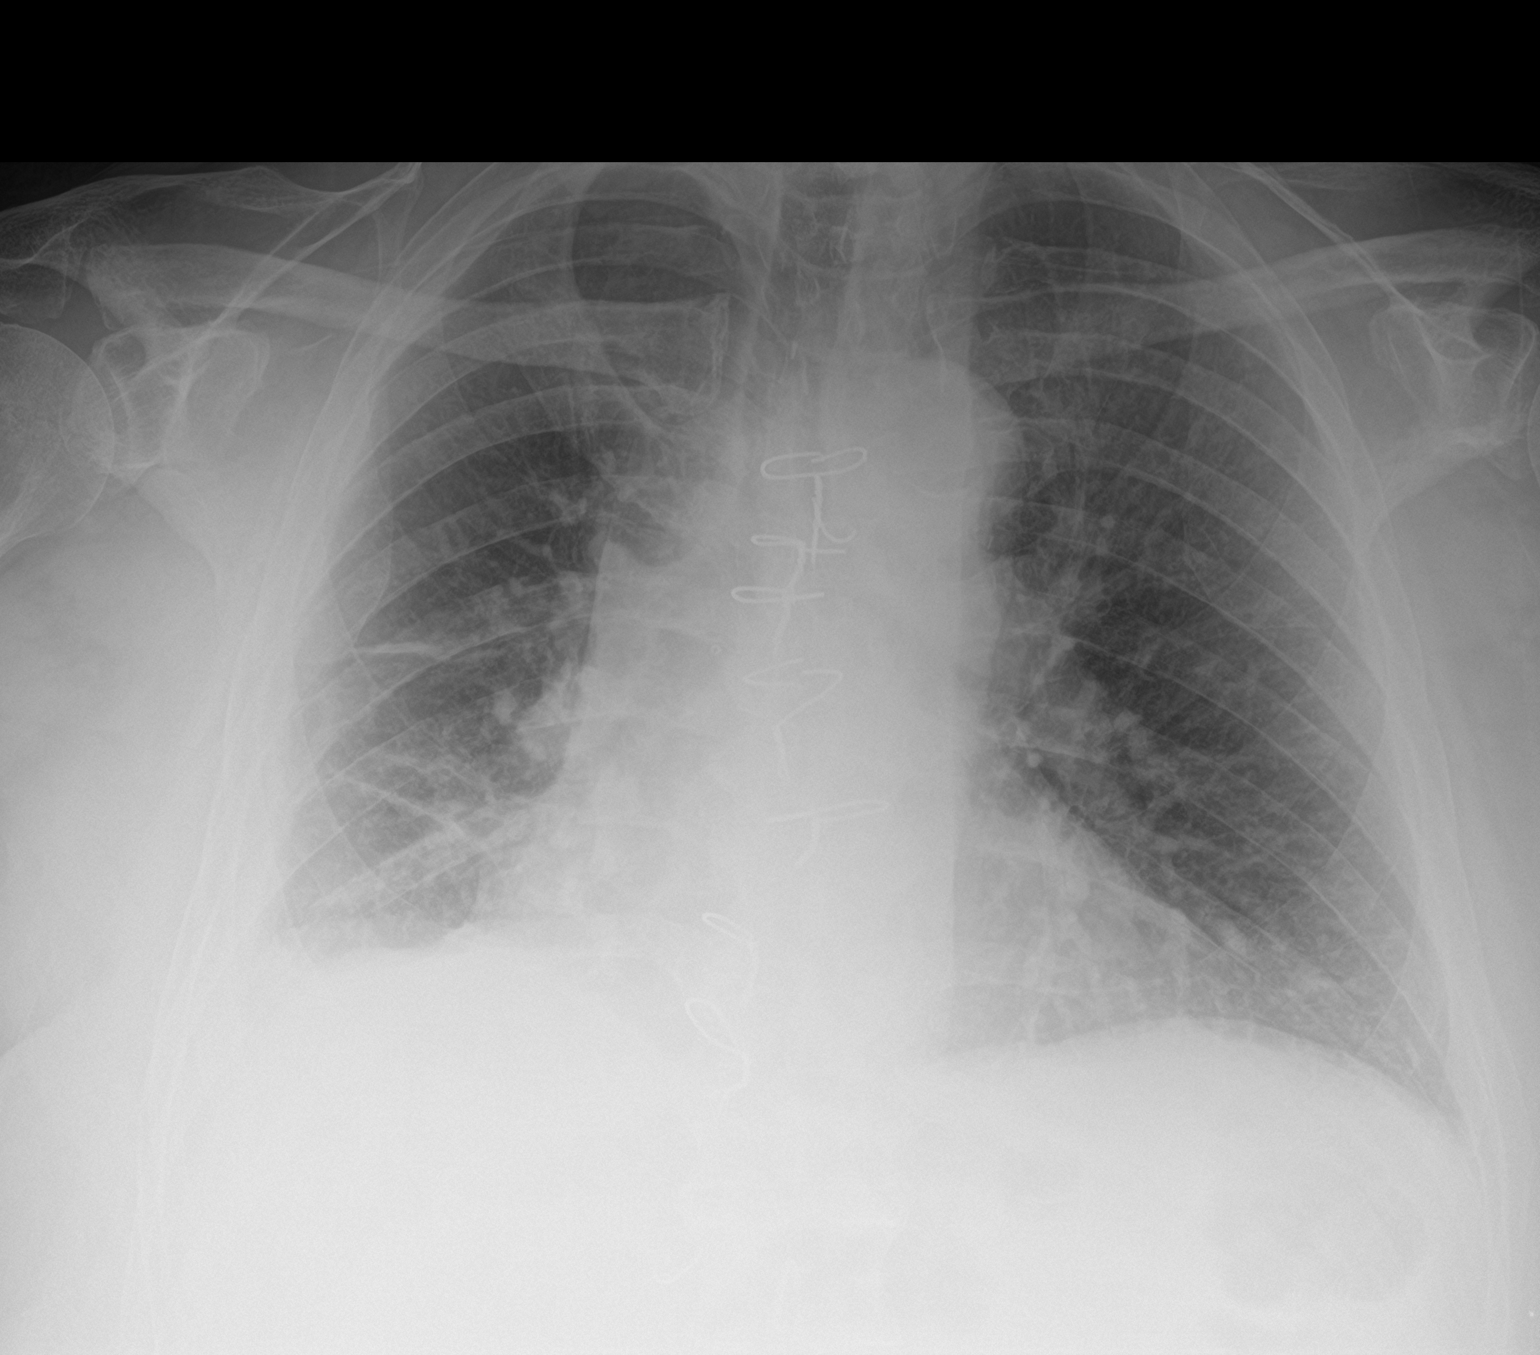

[2 of 2 positions shown; findings below may reference images not displayed]

FINDINGS: Stable cardiomediastinal contours post CABG. Broken sternal wires
again seen. Right pleural effusions/pleural thickening with streaky
opacities at the right lung base, similar. There is no pulmonary
edema or pneumothorax. No acute airspace disease. Stable compression
fracture in the lower thoracic spine.
IMPRESSION: 1. No acute chest finding.
2. Stable right pleural effusion/pleural thickening and streaky
basilar opacity.

## 2022-09-08 ENCOUNTER — Telehealth: Payer: Self-pay

## 2022-09-08 NOTE — Telephone Encounter (Signed)
Transition Care Management Follow-up Telephone Call Date of discharge and from where: 08/31/2022 [facility] How have you been since you were released from the hospital? Patient is feeling much better Any questions or concerns? No  Items Reviewed: Did the pt receive and understand the discharge instructions provided? Yes  Medications obtained and verified? Yes  Other? No  Any new allergies since your discharge? No  Dietary orders reviewed? Yes Do you have support at home? Yes   Follow up appointments reviewed:  PCP Hospital f/u appt confirmed? No  Scheduled to see  on  @ . Specialist Hospital f/u appt confirmed? Yes  Scheduled to see Mosetta Pigeon, MD on 09/20/2022 @ [facility] [employer]. Are transportation arrangements needed? No  If their condition worsens, is the pt aware to call PCP or go to the Emergency Dept.? Yes Was the patient provided with contact information for the PCP's office or ED? Yes Was to pt encouraged to call back with questions or concerns? Yes   Tony Williams [facility]  [facility] Population Health Community Resource Care Guide   [email or web address]  ?? [phone]   Website: [email or web address]  [email or web address]

## 2022-09-14 DIAGNOSIS — E1122 Type 2 diabetes mellitus with diabetic chronic kidney disease: Secondary | ICD-10-CM | POA: Diagnosis not present

## 2022-09-14 DIAGNOSIS — I10 Essential (primary) hypertension: Secondary | ICD-10-CM | POA: Diagnosis not present

## 2022-09-14 DIAGNOSIS — N1832 Chronic kidney disease, stage 3b: Secondary | ICD-10-CM | POA: Diagnosis not present

## 2022-09-14 DIAGNOSIS — R600 Localized edema: Secondary | ICD-10-CM | POA: Diagnosis not present

## 2022-09-20 ENCOUNTER — Other Ambulatory Visit: Payer: Self-pay | Admitting: Family Medicine

## 2022-09-20 DIAGNOSIS — N1832 Chronic kidney disease, stage 3b: Secondary | ICD-10-CM | POA: Diagnosis not present

## 2022-09-20 DIAGNOSIS — E1122 Type 2 diabetes mellitus with diabetic chronic kidney disease: Secondary | ICD-10-CM | POA: Diagnosis not present

## 2022-09-20 DIAGNOSIS — R809 Proteinuria, unspecified: Secondary | ICD-10-CM | POA: Diagnosis not present

## 2022-09-20 DIAGNOSIS — I10 Essential (primary) hypertension: Secondary | ICD-10-CM | POA: Diagnosis not present

## 2022-09-21 ENCOUNTER — Other Ambulatory Visit: Payer: Self-pay | Admitting: Family Medicine

## 2022-09-21 NOTE — Telephone Encounter (Signed)
Medication Refill - Medication: HYDROcodone-acetaminophen (NORCO/VICODIN) 5-325 MG tablet   Has the patient contacted their pharmacy? Pt called directly in because of type of med (Agent: If no, request that the patient contact the pharmacy for the refill. If patient does not wish to contact the pharmacy document the reason why and proceed with request.) (Agent: If yes, when and what did the pharmacy advise?)pt called directly in  Preferred Pharmacy (with phone number or street name):  [id number] - [place],  - [address] AT [facility] [address] Phone: [phone]  Fax: [phone]     Has the patient been seen for an appointment in the last year OR does the patient have an upcoming appointment? yes  Agent: Please be advised that RX refills may take up to 3 business days. We ask that you follow-up with your pharmacy.

## 2022-09-21 NOTE — Telephone Encounter (Signed)
Requested medication (s) are due for refill today: yes  Requested medication (s) are on the active medication list: yes  Last refill:  08/17/22 #30  Future visit scheduled:yes  Notes to clinic:  med not delegated to NT to RF   Requested Prescriptions  Pending Prescriptions Disp Refills   HYDROcodone-acetaminophen (NORCO/VICODIN) 5-325 MG tablet 30 tablet 0    Sig: Take 1 tablet by mouth every 6 (six) hours as needed for moderate pain.     Not Delegated - Analgesics:  Opioid Agonist Combinations Failed - 09/21/2022 10:35 AM      Failed - This refill cannot be delegated      Failed - Urine Drug Screen completed in last 360 days      Passed - Valid encounter within last 3 months    Recent Outpatient Visits           1 month ago Encounter for annual wellness visit (AWV) in Medicare patient   [place] [facility] [place], Marzella Schlein, MD   3 months ago Acute gout due to renal impairment involving toe of left foot   [place] [facility] Merita Norton T, FNP   4 months ago Diabetic polyneuropathy associated with type 2 diabetes mellitus [facility])   [place] [facility] [place], Marzella Schlein, MD   5 months ago Infected sebaceous cyst   [place] [facility] Erasmo Downer, MD   7 months ago Hypertension associated with diabetes [facility])   [place] [facility] [place], Marzella Schlein, MD       Future Appointments             In 3 days Agbor-Etang, Arlys John, MD [facility] [place]   In 1 month Bac[place]alupo, Marzella Schlein, MD [facility], [facility]

## 2022-09-22 ENCOUNTER — Other Ambulatory Visit: Payer: Self-pay | Admitting: Family Medicine

## 2022-09-23 ENCOUNTER — Other Ambulatory Visit: Payer: Self-pay | Admitting: Family Medicine

## 2022-09-23 NOTE — Telephone Encounter (Signed)
Medication Refill - Medication: HYDROcodone-acetaminophen (NORCO/VICODIN) 5-325 MG tablet   Has the patient contacted their pharmacy? Yes.   Pt called 7/09.  He states he is out of medication and needs asap please  Preferred Pharmacy (with phone number or street name): [id number] - [place], [place] - [address]  Has the patient been seen for an appointment in the last year OR does the patient have an upcoming appointment? Yes.    Agent: Please be advised that RX refills may take up to 3 business days. We ask that you follow-up with your pharmacy.

## 2022-09-24 ENCOUNTER — Encounter: Payer: Self-pay | Admitting: Cardiology

## 2022-09-24 ENCOUNTER — Ambulatory Visit: Payer: Medicare Other | Attending: Cardiology | Admitting: Cardiology

## 2022-09-24 VITALS — BP 158/64 | HR 52 | Ht 70.0 in | Wt 296.0 lb

## 2022-09-24 DIAGNOSIS — I5032 Chronic diastolic (congestive) heart failure: Secondary | ICD-10-CM

## 2022-09-24 DIAGNOSIS — Z951 Presence of aortocoronary bypass graft: Secondary | ICD-10-CM | POA: Diagnosis not present

## 2022-09-24 DIAGNOSIS — E7800 Pure hypercholesterolemia, unspecified: Secondary | ICD-10-CM

## 2022-09-24 DIAGNOSIS — I10 Essential (primary) hypertension: Secondary | ICD-10-CM | POA: Diagnosis not present

## 2022-09-24 MED ORDER — HYDROCODONE-ACETAMINOPHEN 5-325 MG PO TABS: 1.0000 | ORAL_TABLET | Freq: Four times a day (QID) | ORAL | 0 refills | Status: DC | PRN

## 2022-09-24 MED ORDER — CLONIDINE HCL 0.1 MG PO TABS: 0.1000 mg | ORAL_TABLET | Freq: Two times a day (BID) | ORAL | 11 refills | Status: DC

## 2022-09-24 MED ORDER — TORSEMIDE 20 MG PO TABS: 20.0000 mg | ORAL_TABLET | Freq: Every day | ORAL | 0 refills | Status: DC

## 2022-09-24 NOTE — Progress Notes (Signed)
Cardiology Office Note:    Date:  09/24/2022   ID:  Tony Williams, DOB 03/19/1945, MRN [id number]  PCP:  Erasmo Downer, MD   [facility]  Cardiologist:  Debbe Odea, MD  Advanced Practice Provider:  No care team member to display Electrophysiologist:  None       Referring MD: Erasmo Downer, MD   Chief Complaint  Patient presents with   Follow-up    Patient is concerned of continued hypertension.  Also increase SOBr.  Taking Torsemide 20 mg every day not PRN.    History of Present Illness:    Tony Williams is a 77 y.o. male with a hx of CAD/CABG x 4 2007, PCI x 2 2018, PCI x 1 SVG-LAD/Diagonal 2022, HFpEF, former smoker,  COPD, OSA, obesity, CKD, diabetes who presents for follow-up.  States blood pressures have been elevated of late.  Usually runs in the 140s to 150s at home.  Heart rates have been low occasionally in the 40s.  Carvedilol was reduced to 3.125 mg twice daily, heart rates still in the 40s at times, on low 50s.  Denies chest pain.  Compliant with medications as prescribed.  Takes torsemide 20 mg daily due to edema and weight gain.   Prior notes Limited echo 11/2020 EF 60 to 65%, indeterminate diastolic function Echo 05/2020 normal systolic function, indeterminate diastolic function, EF 60 to 65%. Lexiscan Myoview, fixed perfusion defect, no evidence for ischemia. Patient recently moved to the area from [facility].  States having CAD/CABG x4 back in 2007, in 2018 had a myocardial infarction, had 2 stents placed.  He is intolerant to statins, has tried multiple statins and not able to tolerate due to myalgias.  Not tolerant to Zetia.  Currently able to tolerate low-dose lovastatin.    Had sleep study in the past and was diagnosed with OSA, he declines using his CPAP mask.  Does not like the feel for the mask.    Past Medical History:  Diagnosis Date   Bladder cancer (HCC) 2013   CAD (coronary artery disease)     a. 2007 CABG x 4: LIMA->D1, VG->OM1, VG->D2->mLAD; b. 2018 NSTEMI/PCI: DES to VG->OM; c. 10/2020 NSTEMI/PCI: LM 90d, LAD 144m, D1 100, LCX 100ost/p, RCA sev diff dzs, VG->D2->mLAD 99/90 before D2 (4.0x15 Onyx Frontier DES), 90 before mLAD (4.0x15 Onyx Frontier DES), LIMA->D1 ok, VG->OM1 100.   Chronic heart failure with preserved ejection fraction (HFpEF) (HCC)    a. 10/2020 Echo: EF 50-55%, apical HK, GrII DD, mod dil LA; b. 11/2020 Echo: EF 60-65%, no rwma.   Chronic kidney disease    COPD (chronic obstructive pulmonary disease) (HCC)    Depression    Diabetes mellitus without complication (HCC)    Hx of CABG x 4    LIMA-D2, SVG-OM (occluded), SeqSVG-D1-LAD.   Labile hypertension    Myocardial infarction [facility])     Past Surgical History:  Procedure Laterality Date   CHOLECYSTECTOMY, LAPAROSCOPIC  12/26/1995   COLONOSCOPY WITH PROPOFOL N/A 09/23/2020   Procedure: COLONOSCOPY WITH PROPOFOL;  Surgeon: Midge Minium, MD;  [facility];  Service: Endoscopy;  Laterality: N/A;   CORONARY ARTERY BYPASS GRAFT     LIMA-D2, SVG-OM, SeqSVG-D1-LAD   CORONARY STENT INTERVENTION N/A 11/12/2020   Procedure: CORONARY STENT INTERVENTION;  Surgeon: Iran Ouch, MD;  Location: [facility] INVASIVE CV LAB;  Service: Cardiovascular;  Laterality: N/A;   EYE SURGERY     IABP INSERTION N/A 11/11/2020   Procedure: IABP  Insertion;  Surgeon: Yvonne Kendall, MD;  Location: [facility] INVASIVE CV LAB;  Service: Cardiovascular;  Laterality: N/A;   IR STENT PLACEMENT ANTE CAROTID INC ANGIO     KNEE SURGERY     RIGHT/LEFT HEART CATH AND CORONARY/GRAFT ANGIOGRAPHY N/A 11/11/2020   Procedure: RIGHT/LEFT HEART CATH AND CORONARY/GRAFT ANGIOGRAPHY;  Surgeon: Yvonne Kendall, MD;  Location: [facility] INVASIVE CV LAB;  Service: Cardiovascular;  Severe native CAD: 90% dLMCA-100% mLAD/100% ost LCx CTO w/ Severe diffuse RCA disease. Widely patent LIMA-D1. Patent sequential SVG-D2-LAD with 99% mid graft & 90% LIMA-D2 @ anastomosis.  previously stented SVG-OM - CTO. Mildly   TRANSURETHRAL RESECTION OF PROSTATE  1994   URINARY SPHINCTER IMPLANT  1995    Current Medications: Current Meds  Medication Sig   acetaminophen (TYLENOL) 325 MG tablet Take 2 tablets (650 mg total) by mouth every 6 (six) hours as needed for mild pain (or Fever >/= 101).   albuterol (PROVENTIL) (2.5 MG/3ML) 0.083% nebulizer solution Take 3 mLs (2.5 mg total) by nebulization every 6 (six) hours as needed for wheezing or shortness of breath.   albuterol (VENTOLIN HFA) 108 (90 Base) MCG/ACT inhaler INHALE 2 PUFFS INTO THE LUNGS EVERY 6 HOURS AS NEEDED FOR WHEEZING OR SHORTNESS OF BREATH   allopurinol (ZYLOPRIM) 300 MG tablet Take 1 tablet (300 mg total) by mouth daily.   amLODipine (NORVASC) 10 MG tablet TAKE 1 TABLET(10 MG) BY MOUTH DAILY   aspirin EC 81 MG tablet Take 81 mg by mouth daily. Swallow whole.   Budeson-Glycopyrrol-Formoterol (BREZTRI AEROSPHERE) 160-9-4.8 MCG/ACT AERO Inhale 2 puffs into the lungs 2 (two) times daily.   Cholecalciferol (VITAMIN D3 PO) Take 4,000 Units by mouth.   cloNIDine (CATAPRES) 0.1 MG tablet Take 1 tablet (0.1 mg total) by mouth 2 (two) times daily.   clopidogrel (PLAVIX) 75 MG tablet TAKE 1 TABLET(75 MG) BY MOUTH DAILY   Coenzyme Q10 (CO Q-10) 400 MG CAPS Take 400 mg by mouth daily at 6 (six) AM.   colchicine 0.6 MG tablet Take 1 tablet (0.6 mg total) by mouth 2 (two) times daily.   desoximetasone (TOPICORT) 0.25 % cream APPLY TOPICALLY TO THE AFFECTED AREA TWICE DAILY   escitalopram (LEXAPRO) 20 MG tablet TAKE 1 TABLET(20 MG) BY MOUTH DAILY   fenofibrate (TRICOR) 145 MG tablet Take 1 tablet (145 mg total) by mouth daily.   glimepiride (AMARYL) 2 MG tablet Take 1 tablet (2 mg total) by mouth daily with breakfast. TAKE 1 TABLET(1 MG) BY MOUTH DAILY WITH BREAKFAST   hydrALAZINE (APRESOLINE) 100 MG tablet TAKE 1 TABLET(100 MG) BY MOUTH THREE TIMES DAILY   HYDROcodone-acetaminophen (NORCO/VICODIN) 5-325 MG tablet Take 1  tablet by mouth every 6 (six) hours as needed for moderate pain.   isosorbide mononitrate (IMDUR) 30 MG 24 hr tablet Take 1 tablet (30 mg total) by mouth daily.   loratadine (CLARITIN) 10 MG tablet Take 1 tablet (10 mg total) by mouth daily.   lovastatin (MEVACOR) 40 MG tablet TAKE 1 TABLET(40 MG) BY MOUTH DAILY   Omega-3 Fatty Acids (FISH OIL) 1200 MG CAPS Take 1,200 mg by mouth daily at 6 (six) AM.   pregabalin (LYRICA) 100 MG capsule TAKE 1 CAPSULE(100 MG) BY MOUTH TWICE DAILY   traZODone (DESYREL) 150 MG tablet TAKE 1 TABLET(150 MG) BY MOUTH AT BEDTIME   [DISCONTINUED] carvedilol (COREG) 3.125 MG tablet Take 1 tablet (3.125 mg total) by mouth 2 (two) times daily.   [DISCONTINUED] torsemide (DEMADEX) 20 MG tablet Take 1 tablet (20 mg  total) by mouth daily as needed. (Patient taking differently: Take 20 mg by mouth daily.)   [DISCONTINUED] torsemide (DEMADEX) 20 MG tablet Take 20 mg by mouth daily.     Allergies:   Dilaudid [hydromorphone], Butorphanol, Morphine, Pedi-pre tape spray [wound dressing adhesive], Statins, and Zocor [simvastatin]   Social History   Socioeconomic History   Marital status: Married    Spouse name: Not on file   Number of children: 2   Years of education: Not on file   Highest education level: High school graduate  Occupational History   Occupation: retired  Tobacco Use   Smoking status: Former    Current packs/day: 0.00    Average packs/day: 1 pack/day for 30.0 years (30.0 ttl pk-yrs)    Types: Cigarettes    Start date: 10/22/1973    Quit date: 10/23/2003    Years since quitting: 18.9   Smokeless tobacco: Never  Vaping Use   Vaping status: Never Used  Substance and Sexual Activity   Alcohol use: Never   Drug use: Never   Sexual activity: Not Currently  Other Topics Concern   Not on file  Social History Narrative   Not on file   Social Determinants of Health   Financial Resource Strain: Low Risk  (05/19/2021)   Overall Financial Resource Strain  (CARDIA)    Difficulty of Paying Living Expenses: Not hard at all  Food Insecurity: No Food Insecurity (05/19/2021)   Hunger Vital Sign    Worried About Running Out of Food in the Last Year: Never true    Ran Out of Food in the Last Year: Never true  Transportation Needs: No Transportation Needs (05/19/2021)   PRAPARE - Administrator, Civil Service (Medical): No    Lack of Transportation (Non-Medical): No  Physical Activity: Inactive (05/19/2021)   Exercise Vital Sign    Days of Exercise per Week: 0 days    Minutes of Exercise per Session: 0 min  Stress: No Stress Concern Present (05/19/2021)   [employer] of Occupational Health - Occupational Stress Questionnaire    Feeling of Stress : Only a little  Recent Concern: Stress - Stress Concern Present (02/26/2021)   [employer] of Occupational Health - Occupational Stress Questionnaire    Feeling of Stress : To some extent  Social Connections: Unknown (05/19/2021)   Social Connection and Isolation Panel [NHANES]    Frequency of Communication with Friends and Family: Not on file    Frequency of Social Gatherings with Friends and Family: Twice a week    Attends Religious Services: More than 4 times per year    Active Member of [employer] or Organizations: No    Attends Banker Meetings: Never    Marital Status: Married     Family History: The patient's family history includes Heart Problems in his father and mother.  ROS:   Please see the history of present illness.     All other systems reviewed and are negative.  EKGs/Labs/Other Studies Reviewed:    The following studies were reviewed today:   EKG:  EKG not ordered today.  Recent Labs: 11/12/2021: Magnesium 2.0 12/28/2021: B Natriuretic Peptide 202.9 08/10/2022: ALT 14 08/31/2022: BUN 38; Creatinine, Ser 2.12; Hemoglobin 12.7; Platelets 203; Potassium 3.9; Sodium 137  Recent Lipid Panel    Component Value Date/Time   CHOL 172 08/10/2022 1456   TRIG  172 (H) 08/10/2022 1456   HDL 34 (L) 08/10/2022 1456   CHOLHDL 5.1 (H) 08/10/2022 1456  CHOLHDL 4.6 12/28/2021 1146   VLDL 37 12/28/2021 1146   LDLCALC 108 (H) 08/10/2022 1456     Risk Assessment/Calculations:      Physical Exam:    VS:  BP (!) 158/64 (BP Location: Left Arm, Patient Position: Sitting, Cuff Size: Large)   Pulse (!) 52   Ht 5\' 10"  (1.778 m)   Wt 296 lb (134.3 kg)   SpO2 97%   BMI 42.47 kg/m     Wt Readings from Last 3 Encounters:  09/24/22 296 lb (134.3 kg)  08/31/22 290 lb (131.5 kg)  08/10/22 291 lb 8 oz (132.2 kg)     GEN:  Well nourished, well developed in no acute distress HEENT: Normal NECK: No JVD; No carotid bruits CARDIAC: RRR, no murmurs, rubs, gallops RESPIRATORY: Diminished breath sounds bilaterally, worse on the right lung ABDOMEN: Soft, non-tender, distended MUSCULOSKELETAL:  No edema; No deformity  SKIN: Warm and dry NEUROLOGIC:  Alert and oriented x 3 PSYCHIATRIC:  Normal affect   ASSESSMENT:    1. S/P CABG x 4   2. Primary hypertension   3. Pure hypercholesterolemia   4. Chronic diastolic heart failure (HCC)    PLAN:    In order of problems listed above:  CAD, CABG x4, PCI x2 (2018).  Recent PCI x2 to SVG 10/2020.  Denies chest pain, continue aspirin, lovastatin, Imdur 30 mg daily.  EF 50 to 55%. Hypertension, BP elevated.  Start clonidine 0.1 mg twice daily.  Continue hydralazine, Norvasc, Imdur.  Coreg stopped due to bradycardia, heart rates in 40s.  History of worsening renal dysfunction while on lisinopril. Hyperlipidemia,  Continue lovastatin 40 mg daily, fenofibrate.  Not tolerant to Zetia, other statins or higher doses of lovastatin,. HFpEF, trace edema, refill torsemide 20 mg daily.  Advised to monitor weights daily at home.  Extra torsemide if weight gain observe, 3 pounds in a day or 5 pounds in a week.  Follow-up in 4 to 6 weeks.   Medication Adjustments/Labs and Tests Ordered: Current medicines are reviewed at  length with the patient today.  Concerns regarding medicines are outlined above.  No orders of the defined types were placed in this encounter.      Meds ordered this encounter  Medications   torsemide (DEMADEX) 20 MG tablet    Sig: Take 1 tablet (20 mg total) by mouth daily.    Dispense:  90 tablet    Refill:  0   cloNIDine (CATAPRES) 0.1 MG tablet    Sig: Take 1 tablet (0.1 mg total) by mouth 2 (two) times daily.    Dispense:  60 tablet    Refill:  11       Patient Instructions  Medication Instructions:   START Clonidine - Take 0.1mg  by mouth twice a day,  STOP Carvedilol   *If you need a refill on your cardiac medications before your next appointment, please call your pharmacy*   Lab Work:  None Ordered  If you have labs (blood work) drawn today and your tests are completely normal, you will receive your results only by: MyChart Message (if you have MyChart) OR A paper copy in the mail If you have any lab test that is abnormal or we need to change your treatment, we will call you to review the results.   Testing/Procedures:  None Ordered    Follow-Up: At [facility], you and your health needs are our priority.  As part of our continuing mission to provide you with exceptional heart  care, we have created designated Provider Care Teams.  These Care Teams include your primary Cardiologist (physician) and Advanced Practice Providers (APPs -  Physician Assistants and Nurse Practitioners) who all work together to provide you with the care you need, when you need it.  We recommend signing up for the patient portal called "MyChart".  Sign up information is provided on this After Visit Summary.  MyChart is used to connect with patients for Virtual Visits (Telemedicine).  Patients are able to view lab/test results, encounter notes, upcoming appointments, etc.  Non-urgent messages can be sent to your provider as well.   To learn more about what you can do with  MyChart, go to [email or web address].    Your next appointment:   4 - 6 weeks   Provider:   You may see Debbe Odea, MD or one of the following Advanced Practice Providers on your designated Care Team:   Nicolasa Ducking, NP Eula Listen, PA-C Cadence Fransico Michael, PA-C Charlsie Quest, NP   Signed, Debbe Odea, MD  09/24/2022 4:51 PM    [facility]

## 2022-09-24 NOTE — Telephone Encounter (Signed)
Requested medication (s) are due for refill today: Yes  Requested medication (s) are on the active medication list: Yes  Last refill:  08/17/22  Future visit scheduled: Yes  Notes to clinic:  Not delegated.    Requested Prescriptions  Pending Prescriptions Disp Refills   HYDROcodone-acetaminophen (NORCO/VICODIN) 5-325 MG tablet 30 tablet 0    Sig: Take 1 tablet by mouth every 6 (six) hours as needed for moderate pain.     Not Delegated - Analgesics:  Opioid Agonist Combinations Failed - 09/23/2022 12:24 PM      Failed - This refill cannot be delegated      Failed - Urine Drug Screen completed in last 360 days      Passed - Valid encounter within last 3 months    Recent Outpatient Visits           1 month ago Encounter for annual wellness visit (AWV) in Medicare patient   [place] [facility] [place], Marzella Schlein, MD   3 months ago Acute gout due to renal impairment involving toe of left foot   [place] [facility] Merita Norton T, FNP   4 months ago Diabetic polyneuropathy associated with type 2 diabetes mellitus [facility])   [place] [facility] [place], Marzella Schlein, MD   5 months ago Infected sebaceous cyst   [place] [facility] [place], Marzella Schlein, MD   7 months ago Hypertension associated with diabetes [facility])   [place] [facility] [place], Marzella Schlein, MD       Future Appointments             Today Debbe Odea, MD [place] [facility] [place]   In 1 month [place], Marzella Schlein, MD [facility], [facility]

## 2022-09-24 NOTE — Patient Instructions (Signed)
Medication Instructions:   START Clonidine - Take 0.1mg  by mouth twice a day,  STOP Carvedilol   *If you need a refill on your cardiac medications before your next appointment, please call your pharmacy*   Lab Work:  None Ordered  If you have labs (blood work) drawn today and your tests are completely normal, you will receive your results only by: MyChart Message (if you have MyChart) OR A paper copy in the mail If you have any lab test that is abnormal or we need to change your treatment, we will call you to review the results.   Testing/Procedures:  None Ordered    Follow-Up: At [facility], you and your health needs are our priority.  As part of our continuing mission to provide you with exceptional heart care, we have created designated Provider Care Teams.  These Care Teams include your primary Cardiologist (physician) and Advanced Practice Providers (APPs -  Physician Assistants and Nurse Practitioners) who all work together to provide you with the care you need, when you need it.  We recommend signing up for the patient portal called "MyChart".  Sign up information is provided on this After Visit Summary.  MyChart is used to connect with patients for Virtual Visits (Telemedicine).  Patients are able to view lab/test results, encounter notes, upcoming appointments, etc.  Non-urgent messages can be sent to your provider as well.   To learn more about what you can do with MyChart, go to [email or web address].    Your next appointment:   4 - 6 weeks   Provider:   You may see Debbe Odea, MD or one of the following Advanced Practice Providers on your designated Care Team:   Nicolasa Ducking, NP Eula Listen, PA-C Cadence Fransico Michael, PA-C Charlsie Quest, NP

## 2022-09-25 DIAGNOSIS — J449 Chronic obstructive pulmonary disease, unspecified: Secondary | ICD-10-CM | POA: Diagnosis not present

## 2022-09-28 ENCOUNTER — Telehealth: Payer: Self-pay | Admitting: Cardiology

## 2022-09-28 NOTE — Telephone Encounter (Signed)
STAT if HR is under 50 or over 120 (normal HR is 60-100 beats per minute)  What is your heart rate? 42  Do you have a log of your heart rate readings (document readings)? No   Do you have any other symptoms? Low HR and BP 114/52 also due to medication cloNIDine (CATAPRES) 0.1 MG tablet

## 2022-09-29 DIAGNOSIS — G4733 Obstructive sleep apnea (adult) (pediatric): Secondary | ICD-10-CM | POA: Diagnosis not present

## 2022-10-01 ENCOUNTER — Other Ambulatory Visit: Payer: Self-pay

## 2022-10-01 NOTE — Telephone Encounter (Signed)
Left message on patient's voicemail stating the following:  "Stop clonidine due to low heart rates and low blood pressure.  Monitor symptoms.  Will consider clonidine at a lower dose of 0.1 mg daily if BP becomes elevated in the future."

## 2022-10-15 ENCOUNTER — Ambulatory Visit: Payer: Medicare Other | Admitting: Adult Health

## 2022-10-19 ENCOUNTER — Other Ambulatory Visit: Payer: Self-pay | Admitting: Family Medicine

## 2022-10-19 ENCOUNTER — Other Ambulatory Visit: Payer: Self-pay | Admitting: Physician Assistant

## 2022-10-19 DIAGNOSIS — E118 Type 2 diabetes mellitus with unspecified complications: Secondary | ICD-10-CM

## 2022-10-19 DIAGNOSIS — N1831 Chronic kidney disease, stage 3a: Secondary | ICD-10-CM

## 2022-10-19 NOTE — Telephone Encounter (Signed)
Requested Prescriptions  Refused Prescriptions Disp Refills   glimepiride (AMARYL) 1 MG tablet [Pharmacy Med Name: GLIMEPIRIDE 1MG  TABLETS] 90 tablet 3    Sig: TAKE 1 TABLET(1 MG) BY MOUTH DAILY WITH BREAKFAST     Endocrinology:  Diabetes - Sulfonylureas Failed - 10/19/2022  6:29 AM      Failed - Cr in normal range and within 360 days    Creatinine, Ser  Date Value Ref Range Status  08/31/2022 2.12 (H) 0.61 - 1.24 mg/dL Final         Passed - HBA1C is between 0 and 7.9 and within 180 days    Hgb A1c MFr Bld  Date Value Ref Range Status  08/10/2022 6.7 (H) 4.8 - 5.6 % Final    Comment:             Prediabetes: 5.7 - 6.4          Diabetes: >6.4          Glycemic control for adults with diabetes: <7.0          Passed - Valid encounter within last 6 months    Recent Outpatient Visits           2 months ago Encounter for annual wellness visit (AWV) in Medicare patient   [place] [facility] [place], Marzella Schlein, MD   4 months ago Acute gout due to renal impairment involving toe of left foot   [place] [facility] Merita Norton T, FNP   5 months ago Diabetic polyneuropathy associated with type 2 diabetes mellitus [facility])   [place] [facility] [place], Marzella Schlein, MD   6 months ago Infected sebaceous cyst   [place] [facility] [place], Marzella Schlein, MD   8 months ago Hypertension associated with diabetes [facility])    [name] [place], Marzella Schlein, MD       Future Appointments             In 3 weeks Bac[place], Marzella Schlein, MD [facility], [facility]   In 1 month Coralyn Helling, MD [facility] at [place]   In 1 month Debbe Odea, MD [facility]

## 2022-10-26 DIAGNOSIS — J449 Chronic obstructive pulmonary disease, unspecified: Secondary | ICD-10-CM | POA: Diagnosis not present

## 2022-10-30 DIAGNOSIS — G4733 Obstructive sleep apnea (adult) (pediatric): Secondary | ICD-10-CM | POA: Diagnosis not present

## 2022-11-02 ENCOUNTER — Other Ambulatory Visit: Payer: Self-pay | Admitting: Family Medicine

## 2022-11-02 NOTE — Telephone Encounter (Signed)
Medication Refill - Medication: HYDROcodone-acetaminophen (NORCO/VICODIN) 5-325 MG   Has the patient contacted their pharmacy? No/ because of type of med (Agent: If no, request that the patient contact the pharmacy for the refill. If patient does not wish to contact the pharmacy document the reason why and proceed with request.) (Agent: If yes, when and what did the pharmacy advise?)  Preferred Pharmacy (with phone number or street name):  [id number] - [place], [place] - [address] AT [facility] OF [address] Phone: [phone]  Fax: [phone]     Has the patient been seen for an appointment in the last year OR does the patient have an upcoming appointment? yes  Agent: Please be advised that RX refills may take up to 3 business days. We ask that you follow-up with your pharmacy.

## 2022-11-03 NOTE — Telephone Encounter (Signed)
Requested medication (s) are due for refill today: yes  Requested medication (s) are on the active medication list: yes    Last refill: 09/24/22  #30  0 refills  Future visit scheduled yes 11/11/22  Notes to clinic:not delegated, please review.  Requested Prescriptions  Pending Prescriptions Disp Refills   HYDROcodone-acetaminophen (NORCO/VICODIN) 5-325 MG tablet 30 tablet 0    Sig: Take 1 tablet by mouth every 6 (six) hours as needed for moderate pain.     Not Delegated - Analgesics:  Opioid Agonist Combinations Failed - 11/02/2022  5:56 PM      Failed - This refill cannot be delegated      Failed - Urine Drug Screen completed in last 360 days      Passed - Valid encounter within last 3 months    Recent Outpatient Visits           2 months ago Encounter for annual wellness visit (AWV) in Medicare patient   [place] [facility] [place], Marzella Schlein, MD   5 months ago Acute gout due to renal impairment involving toe of left foot   [facility] Merita Norton T, FNP   5 months ago Diabetic polyneuropathy associated with type 2 diabetes mellitus [facility])   [place] [facility] [place], Marzella Schlein, MD   7 months ago Infected sebaceous cyst   [place] [facility] Erasmo Downer, MD   8 months ago Hypertension associated with diabetes [facility])   [place] [facility] [place], Marzella Schlein, MD       Future Appointments             In 1 week Bacigalupo, Marzella Schlein, MD [facility], [facility]   In 2 weeks Coralyn Helling, MD [facility] at [place]   In 3 weeks [place], Arlys John, MD [facility] at [facility]

## 2022-11-04 MED ORDER — HYDROCODONE-ACETAMINOPHEN 5-325 MG PO TABS: 1.0000 | ORAL_TABLET | Freq: Four times a day (QID) | ORAL | 0 refills | Status: DC | PRN

## 2022-11-11 ENCOUNTER — Ambulatory Visit (INDEPENDENT_AMBULATORY_CARE_PROVIDER_SITE_OTHER): Payer: Medicare Other | Admitting: Family Medicine

## 2022-11-11 ENCOUNTER — Encounter: Payer: Self-pay | Admitting: Family Medicine

## 2022-11-11 VITALS — BP 145/58 | HR 55 | Ht 70.0 in | Wt 295.5 lb

## 2022-11-11 DIAGNOSIS — G8929 Other chronic pain: Secondary | ICD-10-CM

## 2022-11-11 DIAGNOSIS — E1122 Type 2 diabetes mellitus with diabetic chronic kidney disease: Secondary | ICD-10-CM | POA: Diagnosis not present

## 2022-11-11 DIAGNOSIS — Z794 Long term (current) use of insulin: Secondary | ICD-10-CM

## 2022-11-11 DIAGNOSIS — N1832 Chronic kidney disease, stage 3b: Secondary | ICD-10-CM | POA: Diagnosis not present

## 2022-11-11 DIAGNOSIS — M545 Low back pain, unspecified: Secondary | ICD-10-CM

## 2022-11-11 DIAGNOSIS — M25562 Pain in left knee: Secondary | ICD-10-CM

## 2022-11-11 DIAGNOSIS — E1142 Type 2 diabetes mellitus with diabetic polyneuropathy: Secondary | ICD-10-CM

## 2022-11-11 DIAGNOSIS — E1159 Type 2 diabetes mellitus with other circulatory complications: Secondary | ICD-10-CM

## 2022-11-11 DIAGNOSIS — E1169 Type 2 diabetes mellitus with other specified complication: Secondary | ICD-10-CM

## 2022-11-11 DIAGNOSIS — I152 Hypertension secondary to endocrine disorders: Secondary | ICD-10-CM

## 2022-11-11 DIAGNOSIS — M25561 Pain in right knee: Secondary | ICD-10-CM

## 2022-11-11 LAB — POCT GLYCOSYLATED HEMOGLOBIN (HGB A1C): Hemoglobin A1C: 6.3 % — AB (ref 4.0–5.6)

## 2022-11-11 MED ORDER — PREGABALIN 100 MG PO CAPS: 100.0000 mg | ORAL_CAPSULE | Freq: Two times a day (BID) | ORAL | 1 refills | Status: DC

## 2022-11-11 MED ORDER — HYDROCODONE-ACETAMINOPHEN 5-325 MG PO TABS: 1.0000 | ORAL_TABLET | Freq: Four times a day (QID) | ORAL | 0 refills | Status: DC | PRN

## 2022-11-11 NOTE — Assessment & Plan Note (Signed)
Chronic and stable Recheck metabolic panel at next visit Avoid nephrotoxic meds

## 2022-11-11 NOTE — Assessment & Plan Note (Signed)
On Hydrocodone, with occasional need for additional dose due to back pain. Urine test (UDS) conducted to confirm medication use. -Increase Hydrocodone prescription to 35 tablets per month. -Update controlled substance contract to reflect new prescription quantity.

## 2022-11-11 NOTE — Assessment & Plan Note (Signed)
Discussed importance of healthy weight management Discussed diet and exercise  

## 2022-11-11 NOTE — Assessment & Plan Note (Signed)
Previously well controlled Continue statin Repeat FLP and CMP q6-12 months Goal LDL < 70

## 2022-11-11 NOTE — Assessment & Plan Note (Signed)
Improved control with A1c down to 6.3. -Continue current management.

## 2022-11-11 NOTE — Assessment & Plan Note (Signed)
On Pregabalin 100mg , with occasional breakthrough symptoms. Discussed potential dose increase, but decided against due to concerns about drowsiness. -Continue Pregabalin 100mg  as currently prescribed.

## 2022-11-11 NOTE — Progress Notes (Signed)
Established Patient Office Visit  Subjective   Patient ID: Tony Williams, male    DOB: 02/16/1946  Age: 77 y.o. MRN: [id number]  Chief Complaint  Patient presents with   Medical Management of Chronic Issues    3 month follow up    HPI  Discussed the use of AI scribe software for clinical note transcription with the patient, who gave verbal consent to proceed.  History of Present Illness   The patient, with a history of diabetes, chronic pain, and neuropathy, presents for a routine follow-up. Their last A1c was 6.7, and they report good control of their diabetes. They are on hydrocodone for chronic pain, which they report is generally well-controlled, but they occasionally need to take an extra dose at night due to severe back pain. They are also on pregabalin for neuropathy, which they report is mostly effective, but they occasionally still feel symptoms, particularly at night. They are also on clonidine for hypertension, which they report has helped control their high blood pressure, but has caused their diastolic blood pressure to drop too low, leading to drowsiness. They have been taking one dose a day instead of the prescribed two doses to manage this side effect.          ROS per HPI    Objective:     BP (!) 145/58 (BP Location: Left Arm, Patient Position: Sitting, Cuff Size: Large)   Pulse (!) 55   Ht 5\' 10"  (1.778 m)   Wt 295 lb 8 oz (134 kg)   SpO2 93%   BMI 42.40 kg/m    Physical Exam Vitals reviewed.  Constitutional:      General: He is not in acute distress.    Appearance: Normal appearance. He is not diaphoretic.  HENT:     Head: Normocephalic and atraumatic.  Eyes:     General: No scleral icterus.    Conjunctiva/sclera: Conjunctivae normal.  Cardiovascular:     Rate and Rhythm: Normal rate and regular rhythm.     Heart sounds: Normal heart sounds. No murmur heard. Pulmonary:     Effort: Pulmonary effort is normal. No respiratory distress.      Breath sounds: Normal breath sounds. No wheezing or rhonchi.  Musculoskeletal:     Cervical back: Neck supple.     Right lower leg: No edema.     Left lower leg: No edema.  Lymphadenopathy:     Cervical: No cervical adenopathy.  Skin:    General: Skin is warm and dry.     Findings: No rash.  Neurological:     Mental Status: He is alert and oriented to person, place, and time. Mental status is at baseline.  Psychiatric:        Mood and Affect: Mood normal.        Behavior: Behavior normal.      Results for orders placed or performed in visit on 11/11/22  POCT HgB A1C  Result Value Ref Range   Hemoglobin A1C 6.3 (A) 4.0 - 5.6 %   HbA1c POC (<> result, manual entry)     HbA1c, POC (prediabetic range)     HbA1c, POC (controlled diabetic range)        The ASCVD Risk score (Arnett DK, et al., 2019) failed to calculate for the following reasons:   The patient has a prior MI or stroke diagnosis    Assessment & Plan:   Problem List Items Addressed This Visit       Cardiovascular and Mediastinum  Hypertension associated with diabetes (HCC)    On Clonidine 0.1mg , with concerns about drowsiness and low blood pressure. -Continue Clonidine 0.1mg  daily, and discuss potential adjustments with cardiologist at upcoming appointment.      Relevant Medications   cloNIDine (CATAPRES) 0.1 MG tablet     Endocrine   T2DM (type 2 diabetes mellitus) (HCC) - Primary    Improved control with A1c down to 6.3. -Continue current management.      Relevant Orders   POCT HgB A1C (Completed)   Diabetic polyneuropathy associated with type 2 diabetes mellitus (HCC)    On Pregabalin 100mg , with occasional breakthrough symptoms. Discussed potential dose increase, but decided against due to concerns about drowsiness. -Continue Pregabalin 100mg  as currently prescribed.      Relevant Medications   pregabalin (LYRICA) 100 MG capsule   Hyperlipidemia associated with type 2 diabetes mellitus (HCC)     Previously well controlled Continue statin Repeat FLP and CMP q6-12 months Goal LDL < 70       Relevant Medications   cloNIDine (CATAPRES) 0.1 MG tablet     Genitourinary   Stage 3b chronic kidney disease (CKD) (HCC)    Chronic and stable Recheck metabolic panel at next visit Avoid nephrotoxic meds         Other   Morbid obesity (HCC)    Discussed importance of healthy weight management Discussed diet and exercise       Chronic pain of both knees    On Hydrocodone, with occasional need for additional dose due to back pain. Urine test (UDS) conducted to confirm medication use. -Increase Hydrocodone prescription to 35 tablets per month. -Update controlled substance contract to reflect new prescription quantity.      Relevant Medications   HYDROcodone-acetaminophen (NORCO/VICODIN) 5-325 MG tablet   pregabalin (LYRICA) 100 MG capsule   Other Relevant Orders   Drug Screen 12+Alcohol+CRT, Ur   Other Visit Diagnoses     Chronic bilateral low back pain without sciatica       Relevant Medications   HYDROcodone-acetaminophen (NORCO/VICODIN) 5-325 MG tablet   pregabalin (LYRICA) 100 MG capsule         Return in about 3 months (around 02/11/2023) for chronic disease f/u.    Shirlee Latch, MD

## 2022-11-11 NOTE — Assessment & Plan Note (Signed)
On Clonidine 0.1mg , with concerns about drowsiness and low blood pressure. -Continue Clonidine 0.1mg  daily, and discuss potential adjustments with cardiologist at upcoming appointment.

## 2022-11-15 LAB — DRUG SCREEN 12+ALCOHOL+CRT, UR
Amphetamines, Urine: NEGATIVE ng/mL
BENZODIAZ UR QL: NEGATIVE ng/mL
Barbiturate: NEGATIVE ng/mL
Cannabinoids: NEGATIVE ng/mL
Cocaine (Metabolite): NEGATIVE ng/mL
Creatinine, Urine: 65.8 mg/dL (ref 20.0–300.0)
Ethanol, Urine: NEGATIVE %
Meperidine: NEGATIVE ng/mL
Methadone: NEGATIVE ng/mL
OPIATE SCREEN URINE: NEGATIVE ng/mL
Oxycodone/Oxymorphone, Urine: NEGATIVE ng/mL
Phencyclidine: NEGATIVE ng/mL
Propoxyphene: NEGATIVE ng/mL
Tramadol: NEGATIVE ng/mL

## 2022-11-19 ENCOUNTER — Ambulatory Visit: Payer: Medicare Other | Admitting: Pulmonary Disease

## 2022-11-26 DIAGNOSIS — J449 Chronic obstructive pulmonary disease, unspecified: Secondary | ICD-10-CM | POA: Diagnosis not present

## 2022-11-29 ENCOUNTER — Ambulatory Visit: Payer: Medicare Other | Attending: Cardiology | Admitting: Cardiology

## 2022-11-29 ENCOUNTER — Encounter: Payer: Self-pay | Admitting: Cardiology

## 2022-11-29 VITALS — BP 142/56 | HR 53 | Ht 70.0 in | Wt 295.2 lb

## 2022-11-29 DIAGNOSIS — Z951 Presence of aortocoronary bypass graft: Secondary | ICD-10-CM | POA: Diagnosis not present

## 2022-11-29 DIAGNOSIS — I10 Essential (primary) hypertension: Secondary | ICD-10-CM | POA: Diagnosis not present

## 2022-11-29 DIAGNOSIS — E7800 Pure hypercholesterolemia, unspecified: Secondary | ICD-10-CM

## 2022-11-29 DIAGNOSIS — I5032 Chronic diastolic (congestive) heart failure: Secondary | ICD-10-CM

## 2022-11-29 NOTE — Patient Instructions (Signed)

## 2022-11-29 NOTE — Progress Notes (Signed)
Cardiology Office Note:    Date:  11/29/2022   ID:  Levine Jarosh, DOB Nov 13, 1945, MRN [id number]  PCP:  Erasmo Downer, MD   [facility]  Cardiologist:  Debbe Odea, MD  Advanced Practice Provider:  No care team member to display Electrophysiologist:  None       Referring MD: Erasmo Downer, MD   Chief Complaint  Patient presents with   Follow-up    Was started on Clonidine 0.1mg  BID at last visit.  Not able to tolerate BID due to low blood pressure.  Is taking Clonidine 0.1 mg every day for about 2 weeks with no hypotensive episodes.  Home bp readings are 150s/55-60.      History of Present Illness:    Tony Williams is a 77 y.o. male with a hx of CAD/CABG x 4 2007, PCI x 2 2018, PCI x 1 SVG-LAD/Diagonal 2022, HFpEF, former smoker,  COPD, OSA, obesity, CKD, diabetes who presents for follow-up.  Previously seen due to hypertension on several BP meds.  Started on clonidine 0.1 mg twice daily, developed hypotension, fatigue, dizziness.  Diastolic blood pressure was in the 50s, systolics in the 130s.  He reduce clonidine to 0.1 mg daily, with resolution of symptoms.  Feels well, no concerns at this time.   Prior notes Limited echo 11/2020 EF 60 to 65%, indeterminate diastolic function Echo 05/2020 normal systolic function, indeterminate diastolic function, EF 60 to 65%. Lexiscan Myoview, fixed perfusion defect, no evidence for ischemia. Bradycardia with taking low-dose Coreg 3.125, heart rate in the 40s. Patient recently moved to the area from [facility].  States having CAD/CABG x4 back in 2007, in 2018 had a myocardial infarction, had 2 stents placed.  He is intolerant to statins, has tried multiple statins and not able to tolerate due to myalgias.  Not tolerant to Zetia.  Currently able to tolerate low-dose lovastatin.    Had sleep study in the past and was diagnosed with OSA, he declines using his CPAP mask.  Does not like  the feel for the mask.    Past Medical History:  Diagnosis Date   Bladder cancer (HCC) 2013   CAD (coronary artery disease)    a. 2007 CABG x 4: LIMA->D1, VG->OM1, VG->D2->mLAD; b. 2018 NSTEMI/PCI: DES to VG->OM; c. 10/2020 NSTEMI/PCI: LM 90d, LAD 162m, D1 100, LCX 100ost/p, RCA sev diff dzs, VG->D2->mLAD 99/90 before D2 (4.0x15 Onyx Frontier DES), 90 before mLAD (4.0x15 Onyx Frontier DES), LIMA->D1 ok, VG->OM1 100.   Chronic heart failure with preserved ejection fraction (HFpEF) (HCC)    a. 10/2020 Echo: EF 50-55%, apical HK, GrII DD, mod dil LA; b. 11/2020 Echo: EF 60-65%, no rwma.   Chronic kidney disease    COPD (chronic obstructive pulmonary disease) (HCC)    Depression    Diabetes mellitus without complication (HCC)    Hx of CABG x 4    LIMA-D2, SVG-OM (occluded), SeqSVG-D1-LAD.   Labile hypertension    Myocardial infarction [facility])     Past Surgical History:  Procedure Laterality Date   CHOLECYSTECTOMY, LAPAROSCOPIC  12/26/1995   COLONOSCOPY WITH PROPOFOL N/A 09/23/2020   Procedure: COLONOSCOPY WITH PROPOFOL;  Surgeon: Midge Minium, MD;  Location: [facility] ENDOSCOPY;  Service: Endoscopy;  Laterality: N/A;   CORONARY ARTERY BYPASS GRAFT     LIMA-D2, SVG-OM, SeqSVG-D1-LAD   CORONARY STENT INTERVENTION N/A 11/12/2020   Procedure: CORONARY STENT INTERVENTION;  Surgeon: Iran Ouch, MD;  Location: [facility] INVASIVE CV LAB;  Service: Cardiovascular;  Laterality: N/A;   EYE SURGERY     IABP INSERTION N/A 11/11/2020   Procedure: IABP Insertion;  Surgeon: Yvonne Kendall, MD;  Location: [facility] INVASIVE CV LAB;  Service: Cardiovascular;  Laterality: N/A;   IR STENT PLACEMENT ANTE CAROTID INC ANGIO     KNEE SURGERY     RIGHT/LEFT HEART CATH AND CORONARY/GRAFT ANGIOGRAPHY N/A 11/11/2020   Procedure: RIGHT/LEFT HEART CATH AND CORONARY/GRAFT ANGIOGRAPHY;  Surgeon: Yvonne Kendall, MD;  Location: [facility] INVASIVE CV LAB;  Service: Cardiovascular;  Severe native CAD: 90% dLMCA-100% mLAD/100% ost LCx CTO  w/ Severe diffuse RCA disease. Widely patent LIMA-D1. Patent sequential SVG-D2-LAD with 99% mid graft & 90% LIMA-D2 @ anastomosis. previously stented SVG-OM - CTO. Mildly   TRANSURETHRAL RESECTION OF PROSTATE  1994   URINARY SPHINCTER IMPLANT  1995    Current Medications: Current Meds  Medication Sig   acetaminophen (TYLENOL) 325 MG tablet Take 2 tablets (650 mg total) by mouth every 6 (six) hours as needed for mild pain (or Fever >/= 101).   albuterol (PROVENTIL) (2.5 MG/3ML) 0.083% nebulizer solution Take 3 mLs (2.5 mg total) by nebulization every 6 (six) hours as needed for wheezing or shortness of breath.   albuterol (VENTOLIN HFA) 108 (90 Base) MCG/ACT inhaler INHALE 2 PUFFS INTO THE LUNGS EVERY 6 HOURS AS NEEDED FOR WHEEZING OR SHORTNESS OF BREATH   allopurinol (ZYLOPRIM) 300 MG tablet Take 1 tablet (300 mg total) by mouth daily.   amLODipine (NORVASC) 10 MG tablet TAKE 1 TABLET(10 MG) BY MOUTH DAILY   aspirin EC 81 MG tablet Take 81 mg by mouth daily. Swallow whole.   Budeson-Glycopyrrol-Formoterol (BREZTRI AEROSPHERE) 160-9-4.8 MCG/ACT AERO Inhale 2 puffs into the lungs 2 (two) times daily.   Cholecalciferol (VITAMIN D3 PO) Take 4,000 Units by mouth.   cloNIDine (CATAPRES) 0.1 MG tablet Take 0.1 mg by mouth daily.   clopidogrel (PLAVIX) 75 MG tablet TAKE 1 TABLET(75 MG) BY MOUTH DAILY   Coenzyme Q10 (CO Q-10) 400 MG CAPS Take 400 mg by mouth daily at 6 (six) AM.   colchicine 0.6 MG tablet Take 1 tablet (0.6 mg total) by mouth 2 (two) times daily.   desoximetasone (TOPICORT) 0.25 % cream APPLY TOPICALLY TO THE AFFECTED AREA TWICE DAILY   escitalopram (LEXAPRO) 20 MG tablet TAKE 1 TABLET(20 MG) BY MOUTH DAILY   fenofibrate (TRICOR) 145 MG tablet Take 1 tablet (145 mg total) by mouth daily.   glimepiride (AMARYL) 2 MG tablet Take 1 tablet (2 mg total) by mouth daily with breakfast. TAKE 1 TABLET(1 MG) BY MOUTH DAILY WITH BREAKFAST   hydrALAZINE (APRESOLINE) 100 MG tablet TAKE 1  TABLET(100 MG) BY MOUTH THREE TIMES DAILY   HYDROcodone-acetaminophen (NORCO/VICODIN) 5-325 MG tablet Take 1 tablet by mouth every 6 (six) hours as needed for moderate pain.   isosorbide mononitrate (IMDUR) 30 MG 24 hr tablet Take 1 tablet (30 mg total) by mouth daily.   loratadine (CLARITIN) 10 MG tablet Take 1 tablet (10 mg total) by mouth daily.   lovastatin (MEVACOR) 40 MG tablet TAKE 1 TABLET(40 MG) BY MOUTH DAILY   Omega-3 Fatty Acids (FISH OIL) 1200 MG CAPS Take 1,200 mg by mouth daily at 6 (six) AM.   pregabalin (LYRICA) 100 MG capsule Take 1 capsule (100 mg total) by mouth 2 (two) times daily.   torsemide (DEMADEX) 20 MG tablet Take 1 tablet (20 mg total) by mouth daily.   traZODone (DESYREL) 150 MG tablet TAKE 1 TABLET(150 MG) BY MOUTH AT BEDTIME  Allergies:   Dilaudid [hydromorphone], Butorphanol, Morphine, Pedi-pre tape spray [wound dressing adhesive], Statins, and Zocor [simvastatin]   Social History   Socioeconomic History   Marital status: Married    Spouse name: Not on file   Number of children: 2   Years of education: Not on file   Highest education level: High school graduate  Occupational History   Occupation: retired  Tobacco Use   Smoking status: Former    Current packs/day: 0.00    Average packs/day: 1 pack/day for 30.0 years (30.0 ttl pk-yrs)    Types: Cigarettes    Start date: 10/22/1973    Quit date: 10/23/2003    Years since quitting: 19.1   Smokeless tobacco: Never  Vaping Use   Vaping status: Never Used  Substance and Sexual Activity   Alcohol use: Never   Drug use: Never   Sexual activity: Not Currently  Other Topics Concern   Not on file  Social History Narrative   Not on file   Social Determinants of Health   Financial Resource Strain: Low Risk  (05/19/2021)   Overall Financial Resource Strain (CARDIA)    Difficulty of Paying Living Expenses: Not hard at all  Food Insecurity: No Food Insecurity (05/19/2021)   Hunger Vital Sign    Worried  About Running Out of Food in the Last Year: Never true    Ran Out of Food in the Last Year: Never true  Transportation Needs: No Transportation Needs (05/19/2021)   PRAPARE - Administrator, Civil Service (Medical): No    Lack of Transportation (Non-Medical): No  Physical Activity: Inactive (05/19/2021)   Exercise Vital Sign    Days of Exercise per Week: 0 days    Minutes of Exercise per Session: 0 min  Stress: No Stress Concern Present (05/19/2021)   [employer] of Occupational Health - Occupational Stress Questionnaire    Feeling of Stress : Only a little  Recent Concern: Stress - Stress Concern Present (02/26/2021)   [employer] of Occupational Health - Occupational Stress Questionnaire    Feeling of Stress : To some extent  Social Connections: Unknown (05/19/2021)   Social Connection and Isolation Panel [NHANES]    Frequency of Communication with Friends and Family: Not on file    Frequency of Social Gatherings with Friends and Family: Twice a week    Attends Religious Services: More than 4 times per year    Active Member of [employer] or Organizations: No    Attends Banker Meetings: Never    Marital Status: Married     Family History: The patient's family history includes Heart Problems in his father and mother.  ROS:   Please see the history of present illness.     All other systems reviewed and are negative.  EKGs/Labs/Other Studies Reviewed:    The following studies were reviewed today:   EKG:  EKG not ordered today.  Recent Labs: 12/28/2021: B Natriuretic Peptide 202.9 08/10/2022: ALT 14 08/31/2022: BUN 38; Creatinine, Ser 2.12; Hemoglobin 12.7; Platelets 203; Potassium 3.9; Sodium 137  Recent Lipid Panel    Component Value Date/Time   CHOL 172 08/10/2022 1456   TRIG 172 (H) 08/10/2022 1456   HDL 34 (L) 08/10/2022 1456   CHOLHDL 5.1 (H) 08/10/2022 1456   CHOLHDL 4.6 12/28/2021 1146   VLDL 37 12/28/2021 1146   LDLCALC 108 (H)  08/10/2022 1456     Risk Assessment/Calculations:      Physical Exam:    VS:  BP (!) 142/56 (BP Location: Left Arm, Patient Position: Sitting, Cuff Size: Large)   Pulse (!) 53   Ht 5\' 10"  (1.778 m)   Wt 295 lb 3.2 oz (133.9 kg)   SpO2 91%   BMI 42.36 kg/m     Wt Readings from Last 3 Encounters:  11/29/22 295 lb 3.2 oz (133.9 kg)  11/11/22 295 lb 8 oz (134 kg)  09/24/22 296 lb (134.3 kg)     GEN:  Well nourished, well developed in no acute distress HEENT: Normal NECK: No JVD; No carotid bruits CARDIAC: RRR, no murmurs, rubs, gallops RESPIRATORY: Diminished breath sounds bilaterally, worse on the right lung ABDOMEN: Soft, non-tender, distended MUSCULOSKELETAL:  No edema; No deformity  SKIN: Warm and dry NEUROLOGIC:  Alert and oriented x 3 PSYCHIATRIC:  Normal affect   ASSESSMENT:    1. S/P CABG x 4   2. Primary hypertension   3. Pure hypercholesterolemia   4. Chronic diastolic heart failure (HCC)     PLAN:    In order of problems listed above:  CAD, CABG x4, PCI x2 (2018). PCI x2 to SVG 10/2020.  Denies chest pain, continue aspirin, lovastatin, Imdur 30 mg daily.  EF 50 to 55%. Hypertension, BP systolic elevated, 140s, diastolic low 56.  Overall reasonably controlled.  Continue clonidine 0.1 mg daily, hydralazine, Norvasc, Imdur.  Coreg stopped due to bradycardia, heart rates in 40s.  History of worsening renal dysfunction while on lisinopril. Hyperlipidemia,  Continue lovastatin 40 mg daily, fenofibrate.  Not tolerant to Zetia, other statins or higher doses of lovastatin,. HFpEF, trace edema, cont torsemide 20 mg daily.    Follow-up in 6 months   Medication Adjustments/Labs and Tests Ordered: Current medicines are reviewed at length with the patient today.  Concerns regarding medicines are outlined above.  No orders of the defined types were placed in this encounter.      No orders of the defined types were placed in this encounter.      Patient  Instructions  Medication Instructions:   Your physician recommends that you continue on your current medications as directed. Please refer to the Current Medication list given to you today.  *If you need a refill on your cardiac medications before your next appointment, please call your pharmacy*   Lab Work:  None Ordered  If you have labs (blood work) drawn today and your tests are completely normal, you will receive your results only by: MyChart Message (if you have MyChart) OR A paper copy in the mail If you have any lab test that is abnormal or we need to change your treatment, we will call you to review the results.   Testing/Procedures:  None Ordered    Follow-Up: At [facility], you and your health needs are our priority.  As part of our continuing mission to provide you with exceptional heart care, we have created designated Provider Care Teams.  These Care Teams include your primary Cardiologist (physician) and Advanced Practice Providers (APPs -  Physician Assistants and Nurse Practitioners) who all work together to provide you with the care you need, when you need it.  We recommend signing up for the patient portal called "MyChart".  Sign up information is provided on this After Visit Summary.  MyChart is used to connect with patients for Virtual Visits (Telemedicine).  Patients are able to view lab/test results, encounter notes, upcoming appointments, etc.  Non-urgent messages can be sent to your provider as well.   To learn more  about what you can do with MyChart, go to [email or web address].    Your next appointment:   6 month(s)  Provider:   You may see Debbe Odea, MD or one of the following Advanced Practice Providers on your designated Care Team:   Nicolasa Ducking, NP Eula Listen, PA-C Cadence Fransico Michael, PA-C Charlsie Quest, NP     Signed, Debbe Odea, MD  11/29/2022 3:16 PM    [facility]

## 2022-11-30 DIAGNOSIS — G4733 Obstructive sleep apnea (adult) (pediatric): Secondary | ICD-10-CM | POA: Diagnosis not present

## 2022-12-15 ENCOUNTER — Other Ambulatory Visit: Payer: Self-pay | Admitting: Family Medicine

## 2022-12-15 ENCOUNTER — Other Ambulatory Visit: Payer: Self-pay

## 2022-12-15 DIAGNOSIS — N1832 Chronic kidney disease, stage 3b: Secondary | ICD-10-CM | POA: Diagnosis not present

## 2022-12-15 DIAGNOSIS — E1122 Type 2 diabetes mellitus with diabetic chronic kidney disease: Secondary | ICD-10-CM | POA: Diagnosis not present

## 2022-12-15 DIAGNOSIS — I10 Essential (primary) hypertension: Secondary | ICD-10-CM | POA: Diagnosis not present

## 2022-12-15 DIAGNOSIS — E1129 Type 2 diabetes mellitus with other diabetic kidney complication: Secondary | ICD-10-CM | POA: Diagnosis not present

## 2022-12-15 MED ORDER — TORSEMIDE 20 MG PO TABS: 20.0000 mg | ORAL_TABLET | Freq: Every day | ORAL | 1 refills | Status: DC

## 2022-12-15 NOTE — Telephone Encounter (Signed)
Requested Prescriptions  Pending Prescriptions Disp Refills   traZODone (DESYREL) 150 MG tablet [Pharmacy Med Name: TRAZODONE 150MG  (HUNDRED-FIFTY) TAB] 90 tablet 0    Sig: TAKE 1 TABLET(150 MG) BY MOUTH AT BEDTIME     Psychiatry: Antidepressants - Serotonin Modulator Passed - 12/15/2022  6:38 AM      Passed - Valid encounter within last 6 months    Recent Outpatient Visits           1 month ago Type 2 diabetes mellitus with stage 3b chronic kidney disease, with long-term current use of insulin (HCC)   [facility] [place], Marzella Schlein, MD   4 months ago Encounter for annual wellness visit (AWV) in Medicare patient   [facility] [place], Marzella Schlein, MD   6 months ago Acute gout due to renal impairment involving toe of left foot   [facility] Merita Norton T, FNP   7 months ago Diabetic polyneuropathy associated with type 2 diabetes mellitus [facility])   [facility] [place], Marzella Schlein, MD   8 months ago Infected sebaceous cyst    [facility] [place], Marzella Schlein, MD       Future Appointments             In 2 months Bac[place], Marzella Schlein, MD [facility], [facility]   In 5 months Debbe Odea, MD [facility]

## 2022-12-21 ENCOUNTER — Other Ambulatory Visit: Payer: Self-pay | Admitting: Family Medicine

## 2022-12-21 DIAGNOSIS — E1122 Type 2 diabetes mellitus with diabetic chronic kidney disease: Secondary | ICD-10-CM | POA: Diagnosis not present

## 2022-12-21 DIAGNOSIS — R809 Proteinuria, unspecified: Secondary | ICD-10-CM | POA: Diagnosis not present

## 2022-12-21 DIAGNOSIS — N1832 Chronic kidney disease, stage 3b: Secondary | ICD-10-CM | POA: Diagnosis not present

## 2022-12-21 DIAGNOSIS — I10 Essential (primary) hypertension: Secondary | ICD-10-CM | POA: Diagnosis not present

## 2022-12-21 NOTE — Telephone Encounter (Signed)
Requested medication (s) are due for refill today: yes  Requested medication (s) are on the active medication list: yes    Last refill: 11/11/22  #35   0 refills  Future visit scheduled yes 02/14/23  Notes to clinic: Not delegated, please review.  Requested Prescriptions  Pending Prescriptions Disp Refills   HYDROcodone-acetaminophen (NORCO/VICODIN) 5-325 MG tablet 35 tablet 0    Sig: Take 1 tablet by mouth every 6 (six) hours as needed for moderate pain.     Not Delegated - Analgesics:  Opioid Agonist Combinations Failed - 12/21/2022  2:12 PM      Failed - This refill cannot be delegated      Failed - Urine Drug Screen completed in last 360 days      Passed - Valid encounter within last 3 months    Recent Outpatient Visits           1 month ago Type 2 diabetes mellitus with stage 3b chronic kidney disease, with long-term current use of insulin (HCC)   [facility] [place], Marzella Schlein, MD   4 months ago Encounter for annual wellness visit (AWV) in Medicare patient   [facility] [place], Marzella Schlein, MD   6 months ago Acute gout due to renal impairment involving toe of left foot   [facility] T, FNP   7 months ago Diabetic polyneuropathy associated with type 2 diabetes mellitus [facility])   [facility] [place], Marzella Schlein, MD   8 months ago Infected sebaceous cyst   [facility] [place], Marzella Schlein, MD       Future Appointments             In 1 month Bacigalupo, Marzella Schlein, MD [facility], [facility]   In 5 months Debbe Odea, MD [facility] at [facility]

## 2022-12-21 NOTE — Telephone Encounter (Signed)
Medication Refill - Medication: HYDROcodone-acetaminophen (NORCO/VICODIN) 5-325 MG tablet   Has the patient contacted their pharmacy? No. No, more refills.  (Agent: If no, request that the patient contact the pharmacy for the refill. If patient does not wish to contact the pharmacy document the reason why and proceed with request.)   Preferred Pharmacy (with phone number or street name):  [id number] [place], [place] - [address]  [place] [address]  Phone: [phone] Fax: [phone]  Hours: Not open 24 hours   Has the patient been seen for an appointment in the last year OR does the patient have an upcoming appointment? Yes.    Agent: Please be advised that RX refills may take up to 3 business days. We ask that you follow-up with your pharmacy.

## 2022-12-23 MED ORDER — HYDROCODONE-ACETAMINOPHEN 5-325 MG PO TABS: 1.0000 | ORAL_TABLET | Freq: Four times a day (QID) | ORAL | 0 refills | Status: DC | PRN

## 2022-12-26 DIAGNOSIS — J449 Chronic obstructive pulmonary disease, unspecified: Secondary | ICD-10-CM | POA: Diagnosis not present

## 2022-12-30 DIAGNOSIS — G4733 Obstructive sleep apnea (adult) (pediatric): Secondary | ICD-10-CM | POA: Diagnosis not present

## 2023-01-10 DIAGNOSIS — G4733 Obstructive sleep apnea (adult) (pediatric): Secondary | ICD-10-CM | POA: Diagnosis not present

## 2023-01-15 ENCOUNTER — Other Ambulatory Visit: Payer: Self-pay | Admitting: Family Medicine

## 2023-01-15 DIAGNOSIS — F411 Generalized anxiety disorder: Secondary | ICD-10-CM

## 2023-01-17 NOTE — Telephone Encounter (Signed)
Requested Prescriptions  Pending Prescriptions Disp Refills   escitalopram (LEXAPRO) 20 MG tablet [Pharmacy Med Name: ESCITALOPRAM 20MG  TABLETS] 90 tablet 0    Sig: TAKE 1 TABLET(20 MG) BY MOUTH DAILY     Psychiatry:  Antidepressants - SSRI Passed - 01/15/2023  6:27 AM      Passed - Valid encounter within last 6 months    Recent Outpatient Visits           2 months ago Type 2 diabetes mellitus with stage 3b chronic kidney disease, with long-term current use of insulin (HCC)   [facility] [place], Marzella Schlein, MD   5 months ago Encounter for annual wellness visit (AWV) in Medicare patient   [facility] [place], Marzella Schlein, MD   7 months ago Acute gout due to renal impairment involving toe of left foot   [facility] Merita Norton T, FNP   8 months ago Diabetic polyneuropathy associated with type 2 diabetes mellitus [facility])   [place] [place], Marzella Schlein, MD   9 months ago Infected sebaceous cyst   [place] [facility] [place], Marzella Schlein, MD       Future Appointments             In 4 weeks Bacigalupo, Marzella Schlein, MD [facility], [facility]   In 4 months Debbe Odea, MD [place] [facility]             clopidogrel (PLAVIX) 75 MG tablet [Pharmacy Med Name: CLOPIDOGREL 75MG  TABLETS] 90 tablet 0    Sig: TAKE 1 TABLET(75 MG) BY MOUTH DAILY     Hematology: Antiplatelets - clopidogrel Failed - 01/15/2023  6:27 AM      Failed - HGB in normal range and within 180 days    Hemoglobin  Date Value Ref Range Status  08/31/2022 12.7 (L) 13.0 - 17.0 g/dL Final  40/12/2723 36.6 13.0 - 17.7 g/dL Final         Failed - Cr in normal range and within 360 days    Creatinine, Ser  Date Value Ref Range Status  08/31/2022 2.12 (H) 0.61 - 1.24 mg/dL Final         Passed - HCT in normal range and within 180 days    HCT  Date  Value Ref Range Status  08/31/2022 41.2 39.0 - 52.0 % Final   Hematocrit  Date Value Ref Range Status  04/22/2020 46.9 37.5 - 51.0 % Final         Passed - PLT in normal range and within 180 days    Platelets  Date Value Ref Range Status  08/31/2022 203 150 - 400 K/uL Final  04/22/2020 221 150 - 450 x10E3/uL Final         Passed - Valid encounter within last 6 months    Recent Outpatient Visits           2 months ago Type 2 diabetes mellitus with stage 3b chronic kidney disease, with long-term current use of insulin (HCC)   [facility] [place], Marzella Schlein, MD   5 months ago Encounter for annual wellness visit (AWV) in Medicare patient    [facility] [place], Marzella Schlein, MD   7 months ago Acute gout due to renal impairment involving toe of left foot   [facility] Merita Norton T, FNP   8 months ago  Diabetic polyneuropathy associated with type 2 diabetes mellitus (HCC)   [facility] [place], Marzella Schlein, MD   9 months ago Infected sebaceous cyst   [facility] [place], Marzella Schlein, MD       Future Appointments             In 4 weeks Bacigalupo, Marzella Schlein, MD [facility], [facility]   In 4 months Debbe Odea, MD [facility]             lovastatin (MEVACOR) 40 MG tablet [Pharmacy Med Name: LOVASTATIN 40MG  TABLETS] 90 tablet 0    Sig: TAKE 1 TABLET(40 MG) BY MOUTH DAILY     Cardiovascular:  Antilipid - Statins 2 Failed - 01/15/2023  6:27 AM      Failed - Cr in normal range and within 360 days    Creatinine, Ser  Date Value Ref Range Status  08/31/2022 2.12 (H) 0.61 - 1.24 mg/dL Final         Failed - Lipid Panel in normal range within the last 12 months    Cholesterol, Total  Date Value Ref Range Status  08/10/2022 172 100 - 199 mg/dL Final   LDL Chol Calc (NIH)  Date Value Ref  Range Status  08/10/2022 108 (H) 0 - 99 mg/dL Final   HDL  Date Value Ref Range Status  08/10/2022 34 (L) >39 mg/dL Final   Triglycerides  Date Value Ref Range Status  08/10/2022 172 (H) 0 - 149 mg/dL Final         Passed - Patient is not pregnant      Passed - Valid encounter within last 12 months    Recent Outpatient Visits           2 months ago Type 2 diabetes mellitus with stage 3b chronic kidney disease, with long-term current use of insulin (HCC)   [place] [facility] [place], Marzella Schlein, MD   5 months ago Encounter for annual wellness visit (AWV) in Medicare patient   [place] [facility] [place], Marzella Schlein, MD   7 months ago Acute gout due to renal impairment involving toe of left foot   [facility] Merita Norton T, FNP   8 months ago Diabetic polyneuropathy associated with type 2 diabetes mellitus [facility])   [place] [facility] [place], Marzella Schlein, MD   9 months ago Infected sebaceous cyst   [place] [facility] [place], Marzella Schlein, MD       Future Appointments             In 4 weeks Bacigalupo, Marzella Schlein, MD [facility]   In 4 months Debbe Odea, MD [facility]             hydrALAZINE (APRESOLINE) 100 MG tablet [Pharmacy Med Name: HYDRALAZINE 100MG  (HUNDRED MG) TABS] 270 tablet 0    Sig: TAKE 1 TABLET(100 MG) BY MOUTH THREE TIMES DAILY     Cardiovascular:  Vasodilators Failed - 01/15/2023  6:27 AM      Failed - HGB in normal range and within 360 days    Hemoglobin  Date Value Ref Range Status  08/31/2022 12.7 (L) 13.0 - 17.0 g/dL Final  43/32/9518 84.1 13.0 - 17.7 g/dL Final         Failed - ANA Screen, Ifa, Serum in normal range and within 360  days    No results found for: "ANA", "ANATITER", "LABANTI"       Failed - Last BP in normal range    BP Readings from Last 1 Encounters:   11/29/22 (!) 142/56         Passed - HCT in normal range and within 360 days    HCT  Date Value Ref Range Status  08/31/2022 41.2 39.0 - 52.0 % Final   Hematocrit  Date Value Ref Range Status  04/22/2020 46.9 37.5 - 51.0 % Final         Passed - RBC in normal range and within 360 days    RBC  Date Value Ref Range Status  08/31/2022 4.63 4.22 - 5.81 MIL/uL Final         Passed - WBC in normal range and within 360 days    WBC  Date Value Ref Range Status  08/31/2022 6.0 4.0 - 10.5 K/uL Final         Passed - PLT in normal range and within 360 days    Platelets  Date Value Ref Range Status  08/31/2022 203 150 - 400 K/uL Final  04/22/2020 221 150 - 450 x10E3/uL Final         Passed - Valid encounter within last 12 months    Recent Outpatient Visits           2 months ago Type 2 diabetes mellitus with stage 3b chronic kidney disease, with long-term current use of insulin (HCC)   [place] [place], Marzella Schlein, MD   5 months ago Encounter for annual wellness visit (AWV) in Medicare patient   [place] [facility] [place], Marzella Schlein, MD   7 months ago Acute gout due to renal impairment involving toe of left foot   [facility] Mer[facility] Norton T, FNP   8 months ago Diabetic polyneuropathy associated with type 2 diabetes mellitus [facility])   [place] [facility] [place], Marzella Schlein, MD   9 months ago Infected sebaceous cyst    [facility], Marz[facility] Schlein, MD       Future Appointments             In 4 weeks Bacigalupo, Marzella Schlein, MD [facility], [facility]   In 4 months Debbe Odea, MD [facility]

## 2023-01-18 ENCOUNTER — Other Ambulatory Visit: Payer: Self-pay | Admitting: Family Medicine

## 2023-01-18 DIAGNOSIS — F411 Generalized anxiety disorder: Secondary | ICD-10-CM

## 2023-01-19 NOTE — Telephone Encounter (Signed)
Med was reordered 01/17/23 and sent to requesting pharmacy  Requested Prescriptions  Refused Prescriptions Disp Refills   clopidogrel (PLAVIX) 75 MG tablet [Pharmacy Med Name: CLOPIDOGREL 75MG  TABLETS] 90 tablet 0    Sig: TAKE 1 TABLET(75 MG) BY MOUTH DAILY     Hematology: Antiplatelets - clopidogrel Failed - 01/18/2023  9:07 AM      Failed - HGB in normal range and within 180 days    Hemoglobin  Date Value Ref Range Status  08/31/2022 12.7 (L) 13.0 - 17.0 g/dL Final  03/47/4259 56.3 13.0 - 17.7 g/dL Final         Failed - Cr in normal range and within 360 days    Creatinine, Ser  Date Value Ref Range Status  08/31/2022 2.12 (H) 0.61 - 1.24 mg/dL Final         Passed - HCT in normal range and within 180 days    HCT  Date Value Ref Range Status  08/31/2022 41.2 39.0 - 52.0 % Final   Hematocrit  Date Value Ref Range Status  04/22/2020 46.9 37.5 - 51.0 % Final         Passed - PLT in normal range and within 180 days    Platelets  Date Value Ref Range Status  08/31/2022 203 150 - 400 K/uL Final  04/22/2020 221 150 - 450 x10E3/uL Final         Passed - Valid encounter within last 6 months    Recent Outpatient Visits           2 months ago Type 2 diabetes mellitus with stage 3b chronic kidney disease, with long-term current use of insulin (HCC)   [place] [place], Marzella Schlein, MD   5 months ago Encounter for annual wellness visit (AWV) in Medicare patient   [place] [facility] [place], Marzella Schlein, MD   7 months ago Acute gout due to renal impairment involving toe of left foot   [facility] Mer[facility] Norton T, FNP   8 months ago Diabetic polyneuropathy associated with type 2 diabetes mellitus [facility])   [place] [facility] [place], Marzella Schlein, MD   9 months ago Infected sebaceous cyst   [facility] [place], Marzella Schlein, MD       Future Appointments              In 3 weeks Bacigalupo, Marzella Schlein, MD [facility], [facility]   In 4 months Debbe Odea, MD [place] [facility]             escitalopram (LEXAPRO) 20 MG tablet [Pharmacy Med Name: ESCITALOPRAM 20MG  TABLETS] 90 tablet 0    Sig: TAKE 1 TABLET(20 MG) BY MOUTH DAILY     Psychiatry:  Antidepressants - SSRI Passed - 01/18/2023  9:07 AM      Passed - Valid encounter within last 6 months    Recent Outpatient Visits           2 months ago Type 2 diabetes mellitus with stage 3b chronic kidney disease, with long-term current use of insulin (HCC)   [facility] [place], Marzella Schlein, MD   5 months ago Encounter for annual wellness visit (AWV) in Medicare patient   [facility] [place], Marzella Schlein, MD   7 months ago Acute gout due to renal impairment involving toe of left foot   [place] [facility] Jacky Kindle, FNP   8 months ago Diabetic polyneuropathy associated with type 2 diabetes mellitus [facility])   [place] [facility] [place], Marzella Schlein, MD   9 months ago Infected sebaceous cyst   [place] [facility] [place], Marzella Schlein, MD       Future Appointments             In 3 weeks Bacigalupo, Marzella Schlein, MD [facility], [facility]   In 4 months [place], Arlys John, MD [facility]

## 2023-01-21 ENCOUNTER — Other Ambulatory Visit: Payer: Self-pay | Admitting: Family Medicine

## 2023-01-21 DIAGNOSIS — F411 Generalized anxiety disorder: Secondary | ICD-10-CM

## 2023-01-21 NOTE — Telephone Encounter (Signed)
[facility] called and spoke to [place], [facility] about the refill(s) escitalopram, lovastatin, and hydralazine requested. Advised it was sent on 01/17/23 #90/0 refill(s). He says neither was received. Advised I will resubmit.

## 2023-01-21 NOTE — Telephone Encounter (Signed)
Requested Prescriptions  Pending Prescriptions Disp Refills   escitalopram (LEXAPRO) 20 MG tablet [Pharmacy Med Name: ESCITALOPRAM 20MG  TABLETS] 90 tablet 0    Sig: TAKE 1 TABLET(20 MG) BY MOUTH DAILY     Psychiatry:  Antidepressants - SSRI Passed - 01/21/2023 10:08 AM      Passed - Valid encounter within last 6 months    Recent Outpatient Visits           2 months ago Type 2 diabetes mellitus with stage 3b chronic kidney disease, with long-term current use of insulin (HCC)   [facility] [place], Marzella Schlein, MD   5 months ago Encounter for annual wellness visit (AWV) in Medicare patient   [place] [facility] [place], Marzella Schlein, MD   7 months ago Acute gout due to renal impairment involving toe of left foot   [place] [facility] Merita Norton T, FNP   8 months ago Diabetic polyneuropathy associated with type 2 diabetes mellitus [facility])   [place] [place], Marzella Schlein, MD   9 months ago Infected sebaceous cyst   [place] [place], Marzella Schlein, MD       Future Appointments             In 3 weeks Bacigalupo, Marzella Schlein, MD [facility], [facility]   In 4 months Debbe Odea, MD [facility]             hydrALAZINE (APRESOLINE) 100 MG tablet [Pharmacy Med Name: HYDRALAZINE 100MG  (HUNDRED MG) TABS] 270 tablet 0    Sig: TAKE 1 TABLET(100 MG) BY MOUTH THREE TIMES DAILY     Cardiovascular:  Vasodilators Failed - 01/21/2023 10:08 AM      Failed - HGB in normal range and within 360 days    Hemoglobin  Date Value Ref Range Status  08/31/2022 12.7 (L) 13.0 - 17.0 g/dL Final  81/19/1478 29.5 13.0 - 17.7 g/dL Final         Failed - ANA Screen, Ifa, Serum in normal range and within 360 days    No results found for: "ANA", "ANATITER", "LABANTI"       Failed - Last BP in normal range    BP Readings from Last 1  Encounters:  11/29/22 (!) 142/56         Passed - HCT in normal range and within 360 days    HCT  Date Value Ref Range Status  08/31/2022 41.2 39.0 - 52.0 % Final   Hematocrit  Date Value Ref Range Status  04/22/2020 46.9 37.5 - 51.0 % Final         Passed - RBC in normal range and within 360 days    RBC  Date Value Ref Range Status  08/31/2022 4.63 4.22 - 5.81 MIL/uL Final         Passed - WBC in normal range and within 360 days    WBC  Date Value Ref Range Status  08/31/2022 6.0 4.0 - 10.5 K/uL Final         Passed - PLT in normal range and within 360 days    Platelets  Date Value Ref Range Status  08/31/2022 203 150 - 400 K/uL Final  04/22/2020 221 150 - 450 x10E3/uL Final         Passed - Valid encounter within last 12 months    Recent Outpatient Visits  2 months ago Type 2 diabetes mellitus with stage 3b chronic kidney disease, with long-term current use of insulin (HCC)   [facility] [place], Marzella Schlein, MD   5 months ago Encounter for annual wellness visit (AWV) in Medicare patient   [facility] [place], Marzella Schlein, MD   7 months ago Acute gout due to renal impairment involving toe of left foot   [facility] Merita Norton T, FNP   8 months ago Diabetic polyneuropathy associated with type 2 diabetes mellitus [facility])   [place] [facility] [place], Marzella Schlein, MD   9 months ago Infected sebaceous cyst   [place] [place], Marzella Schlein, MD       Future Appointments             In 3 weeks Bacigalupo, Marzella Schlein, MD [facility], [facility]   In 4 months Debbe Odea, MD [facility]             lovastatin (MEVACOR) 40 MG tablet [Pharmacy Med Name: LOVASTATIN 40MG  TABLETS] 90 tablet 0    Sig: TAKE 1 TABLET(40 MG) BY MOUTH DAILY     Cardiovascular:  Antilipid - Statins 2  Failed - 01/21/2023 10:08 AM      Failed - Cr in normal range and within 360 days    Creatinine, Ser  Date Value Ref Range Status  08/31/2022 2.12 (H) 0.61 - 1.24 mg/dL Final         Failed - Lipid Panel in normal range within the last 12 months    Cholesterol, Total  Date Value Ref Range Status  08/10/2022 172 100 - 199 mg/dL Final   LDL Chol Calc (NIH)  Date Value Ref Range Status  08/10/2022 108 (H) 0 - 99 mg/dL Final   HDL  Date Value Ref Range Status  08/10/2022 34 (L) >39 mg/dL Final   Triglycerides  Date Value Ref Range Status  08/10/2022 172 (H) 0 - 149 mg/dL Final         Passed - Patient is not pregnant      Passed - Valid encounter within last 12 months    Recent Outpatient Visits           2 months ago Type 2 diabetes mellitus with stage 3b chronic kidney disease, with long-term current use of insulin (HCC)   [place] [facility] [place], Marzella Schlein, MD   5 months ago Encounter for annual wellness visit (AWV) in Medicare patient   [place] [facility] [place], Marzella Schlein, MD   7 months ago Acute gout due to renal impairment involving toe of left foot   [facility] Merita Norton T, FNP   8 months ago Diabetic polyneuropathy associated with type 2 diabetes mellitus [facility])   [place] [facility] [address], Marzella Schlein, MD   9 months ago Infected sebaceous cyst    [place] [facility] [place], Marzella Schlein, MD       Future Appointments             In 3 weeks Bacigalupo, Marzella Schlein, MD [facility], [facility]   In 4 months Debbe Odea, MD [facility]

## 2023-01-21 NOTE — Telephone Encounter (Signed)
Requested Prescriptions  Refused Prescriptions Disp Refills   escitalopram (LEXAPRO) 20 MG tablet 90 tablet 0    Sig: TAKE 1 TABLET(20 MG) BY MOUTH DAILY     Psychiatry:  Antidepressants - SSRI Passed - 01/21/2023 12:04 PM      Passed - Valid encounter within last 6 months    Recent Outpatient Visits           2 months ago Type 2 diabetes mellitus with stage 3b chronic kidney disease, with long-term current use of insulin (HCC)   [place] [facility] [place], Marzella Schlein, MD   5 months ago Encounter for annual wellness visit (AWV) in Medicare patient   [place] [facility] [place], Marzella Schlein, MD   7 months ago Acute gout due to renal impairment involving toe of left foot   [place] [facility] Merita Norton T, FNP   8 months ago Diabetic polyneuropathy associated with type 2 diabetes mellitus [facility])   [place] [facility] [place], Marzella Schlein, MD   9 months ago Infected sebaceous cyst   [place] [facility] [place], Marzella Schlein, MD       Future Appointments             In 3 weeks Bacigalupo, Marzella Schlein, MD [facility], [facility]   In 4 months Debbe Odea, MD [place] [facility]             clopidogrel (PLAVIX) 75 MG tablet 90 tablet 0    Sig: TAKE 1 TABLET(75 MG) BY MOUTH DAILY     Hematology: Antiplatelets - clopidogrel Failed - 01/21/2023 12:04 PM      Failed - HGB in normal range and within 180 days    Hemoglobin  Date Value Ref Range Status  08/31/2022 12.7 (L) 13.0 - 17.0 g/dL Final  62/13/0865 78.4 13.0 - 17.7 g/dL Final         Failed - Cr in normal range and within 360 days    Creatinine, Ser  Date Value Ref Range Status  08/31/2022 2.12 (H) 0.61 - 1.24 mg/dL Final         Passed - HCT in normal range and within 180 days    HCT  Date Value Ref Range Status  08/31/2022 41.2 39.0 - 52.0 % Final   Hematocrit  Date Value Ref  Range Status  04/22/2020 46.9 37.5 - 51.0 % Final         Passed - PLT in normal range and within 180 days    Platelets  Date Value Ref Range Status  08/31/2022 203 150 - 400 K/uL Final  04/22/2020 221 150 - 450 x10E3/uL Final         Passed - Valid encounter within last 6 months    Recent Outpatient Visits           2 months ago Type 2 diabetes mellitus with stage 3b chronic kidney disease, with long-term current use of insulin (HCC)   [place] [facility] [place], Marzella Schlein, MD   5 months ago Encounter for annual wellness visit (AWV) in Medicare patient   [place] [facility] [place], Marzella Schlein, MD   7 months ago Acute gout due to renal impairment involving toe of left foot   [place] [facility] Merita Norton T, FNP   8 months ago Diabetic polyneuropathy associated with type 2 diabetes mellitus [facility])   [place] [facility] [place]  [facility] Bacigalupo, Marzella Schlein, MD   9 months ago Infected sebaceous cyst   [facility] [place], Marzella Schlein, MD       Future Appointments             In 3 weeks Bacigalupo, Marzella Schlein, MD [facility], [facility]   In 4 months Debbe Odea, MD [facility]             lovastatin (MEVACOR) 40 MG tablet 90 tablet 0     Cardiovascular:  Antilipid - Statins 2 Failed - 01/21/2023 12:04 PM      Failed - Cr in normal range and within 360 days    Creatinine, Ser  Date Value Ref Range Status  08/31/2022 2.12 (H) 0.61 - 1.24 mg/dL Final         Failed - Lipid Panel in normal range within the last 12 months    Cholesterol, Total  Date Value Ref Range Status  08/10/2022 172 100 - 199 mg/dL Final   LDL Chol Calc (NIH)  Date Value Ref Range Status  08/10/2022 108 (H) 0 - 99 mg/dL Final   HDL  Date Value Ref Range Status  08/10/2022 34 (L) >39 mg/dL Final   Triglycerides  Date Value Ref Range Status  08/10/2022  172 (H) 0 - 149 mg/dL Final         Passed - Patient is not pregnant      Passed - Valid encounter within last 12 months    Recent Outpatient Visits           2 months ago Type 2 diabetes mellitus with stage 3b chronic kidney disease, with long-term current use of insulin (HCC)   [place] [facility] [place], Marzella Schlein, MD   5 months ago Encounter for annual wellness visit (AWV) in Medicare patient   [place] [facility] [place], Marzella Schlein, MD   7 months ago Acute gout due to renal impairment involving toe of left foot   [place] [facility] Merita Norton T, FNP   8 months ago Diabetic polyneuropathy associated with type 2 diabetes mellitus [facility])   [place] [facility] [place], Marzella Schlein, MD   9 months ago Infected sebaceous cyst   [place] [facility] [place], Marzella Schlein, MD       Future Appointments             In 3 weeks Bacigalupo, Marzella Schlein, MD [facility], [facility]   In 4 months Debbe Odea, MD [facility]             hydrALAZINE (APRESOLINE) 100 MG tablet 270 tablet 0     Cardiovascular:  Vasodilators Failed - 01/21/2023 12:04 PM      Failed - HGB in normal range and within 360 days    Hemoglobin  Date Value Ref Range Status  08/31/2022 12.7 (L) 13.0 - 17.0 g/dL Final  19/14/7829 56.2 13.0 - 17.7 g/dL Final         Failed - ANA Screen, Ifa, Serum in normal range and within 360 days    No results found for: "ANA", "ANATITER", "LABANTI"       Failed - Last BP in normal range    BP Readings from Last 1 Encounters:  11/29/22 (!) 142/56         Passed - HCT in normal range and within 360  days    HCT  Date Value Ref Range Status  08/31/2022 41.2 39.0 - 52.0 % Final   Hematocrit  Date Value Ref Range Status  04/22/2020 46.9 37.5 - 51.0 % Final         Passed - RBC in normal range and within 360 days    RBC   Date Value Ref Range Status  08/31/2022 4.63 4.22 - 5.81 MIL/uL Final         Passed - WBC in normal range and within 360 days    WBC  Date Value Ref Range Status  08/31/2022 6.0 4.0 - 10.5 K/uL Final         Passed - PLT in normal range and within 360 days    Platelets  Date Value Ref Range Status  08/31/2022 203 150 - 400 K/uL Final  04/22/2020 221 150 - 450 x10E3/uL Final         Passed - Valid encounter within last 12 months    Recent Outpatient Visits           2 months ago Type 2 diabetes mellitus with stage 3b chronic kidney disease, with long-term current use of insulin (HCC)   [place] [facility] [place], Marzella Schlein, MD   5 months ago Encounter for annual wellness visit (AWV) in Medicare patient   [facility] [place], Marzella Schlein, MD   7 months ago Acute gout due to renal impairment involving toe of left foot   [facility] T, FNP   8 months ago Diabetic polyneuropathy associated with type 2 diabetes mellitus [facility])   [place] [facility] [place], Marzella Schlein, MD   9 months ago Infected sebaceous cyst   [place] [facility] [place], Marzella Schlein, MD       Future Appointments             In 3 weeks Bacigalupo, Marzella Schlein, MD [facility], [facility]   In 4 months Debbe Odea, MD [facility]

## 2023-01-21 NOTE — Telephone Encounter (Signed)
Medication Refill -  Most Recent Primary Care Visit:  Provider: Erasmo Downer  Department: [facility]  Visit Type: OFFICE VISIT  Date: 11/11/2022  Medication: escitalopram (LEXAPRO) 20 MG tablet , clopidogrel (PLAVIX) 75 MG tablet , lovastatin (MEVACOR) 40 MG tablet , hydrALAZINE (APRESOLINE) 100 MG tablet   Pt stated he has reached out to the pharmacy, and they are telling him they don't have his medication. Please advise.   Has the patient contacted their pharmacy? Yes (Agent: If no, request that the patient contact the pharmacy for the refill. If patient does not wish to contact the pharmacy document the reason why and proceed with request.) (Agent: If yes, when and what did the pharmacy advise?)  Is this the correct pharmacy for this prescription? Yes If no, delete pharmacy and type the correct one.  This is the patient's preferred pharmacy:  [id number] - [place], [place] - [address] AT [facility] OF [address] [facility] [address] [place] [address] Phone: [phone] Fax: [phone]    Has the prescription been filled recently? Yes  Is the patient out of the medication? Yes  Has the patient been seen for an appointment in the last year OR does the patient have an upcoming appointment? Yes  Can we respond through MyChart? Yes  Agent: Please be advised that Rx refills may take up to 3 business days. We ask that you follow-up with your pharmacy.

## 2023-01-21 NOTE — Telephone Encounter (Signed)
Called back to [facility] to verify receipt, spoke to [place], [facility] and he says all 3 came through. Hydralazine has to be ordered out so will be available on Monday.

## 2023-01-26 DIAGNOSIS — J449 Chronic obstructive pulmonary disease, unspecified: Secondary | ICD-10-CM | POA: Diagnosis not present

## 2023-01-30 DIAGNOSIS — G4733 Obstructive sleep apnea (adult) (pediatric): Secondary | ICD-10-CM | POA: Diagnosis not present

## 2023-01-31 ENCOUNTER — Other Ambulatory Visit: Payer: Self-pay

## 2023-01-31 ENCOUNTER — Telehealth: Payer: Self-pay | Admitting: Family Medicine

## 2023-01-31 MED ORDER — HYDROCODONE-ACETAMINOPHEN 5-325 MG PO TABS: 1.0000 | ORAL_TABLET | Freq: Four times a day (QID) | ORAL | 0 refills | Status: DC | PRN

## 2023-01-31 NOTE — Telephone Encounter (Signed)
Medication Refill -  Most Recent Primary Care Visit:  Provider: Erasmo Downer  Department: [facility]  Visit Type: OFFICE VISIT  Date: 11/11/2022  Medication: HYDROcodone-acetaminophen (NORCO/VICODIN) 5-325 MG tablet [[id number]]   Has the patient contacted their pharmacy? Yes (Agent: If no, request that the patient contact the pharmacy for the refill. If patient does not wish to contact the pharmacy document the reason why and proceed with request.) (Agent: If yes, when and what did the pharmacy advise?)  Is this the correct pharmacy for this prescription? Yes If no, delete pharmacy and type the correct one.  This is the patient's preferred pharmacy:  [id number] - [place], [place] - [address] AT [facility] OF [address] Phone: [phone]  Fax: [phone]      Has the prescription been filled recently? Yes  Is the patient out of the medication? No  Has the patient been seen for an appointment in the last year OR does the patient have an upcoming appointment? No  Can we respond through MyChart? No  Agent: Please be advised that Rx refills may take up to 3 business days. We ask that you follow-up with your pharmacy.

## 2023-02-05 ENCOUNTER — Emergency Department: Payer: Medicare Other

## 2023-02-05 ENCOUNTER — Emergency Department
Admission: EM | Admit: 2023-02-05 | Discharge: 2023-02-05 | Disposition: A | Discharge status: Home / Self Care | Payer: Medicare Other | Attending: Student in an Organized Health Care Education/Training Program | Admitting: Student in an Organized Health Care Education/Training Program

## 2023-02-05 ENCOUNTER — Other Ambulatory Visit: Payer: Self-pay

## 2023-02-05 DIAGNOSIS — Z951 Presence of aortocoronary bypass graft: Secondary | ICD-10-CM | POA: Insufficient documentation

## 2023-02-05 DIAGNOSIS — N1832 Chronic kidney disease, stage 3b: Secondary | ICD-10-CM | POA: Insufficient documentation

## 2023-02-05 DIAGNOSIS — I130 Hypertensive heart and chronic kidney disease with heart failure and stage 1 through stage 4 chronic kidney disease, or unspecified chronic kidney disease: Secondary | ICD-10-CM | POA: Diagnosis not present

## 2023-02-05 DIAGNOSIS — E1169 Type 2 diabetes mellitus with other specified complication: Secondary | ICD-10-CM | POA: Diagnosis not present

## 2023-02-05 DIAGNOSIS — E785 Hyperlipidemia, unspecified: Secondary | ICD-10-CM | POA: Diagnosis not present

## 2023-02-05 DIAGNOSIS — M25562 Pain in left knee: Secondary | ICD-10-CM | POA: Diagnosis not present

## 2023-02-05 DIAGNOSIS — I2510 Atherosclerotic heart disease of native coronary artery without angina pectoris: Secondary | ICD-10-CM | POA: Insufficient documentation

## 2023-02-05 DIAGNOSIS — J449 Chronic obstructive pulmonary disease, unspecified: Secondary | ICD-10-CM | POA: Insufficient documentation

## 2023-02-05 DIAGNOSIS — E1122 Type 2 diabetes mellitus with diabetic chronic kidney disease: Secondary | ICD-10-CM | POA: Insufficient documentation

## 2023-02-05 DIAGNOSIS — I5033 Acute on chronic diastolic (congestive) heart failure: Secondary | ICD-10-CM | POA: Insufficient documentation

## 2023-02-05 DIAGNOSIS — M1712 Unilateral primary osteoarthritis, left knee: Secondary | ICD-10-CM | POA: Diagnosis not present

## 2023-02-05 DIAGNOSIS — Z955 Presence of coronary angioplasty implant and graft: Secondary | ICD-10-CM | POA: Insufficient documentation

## 2023-02-05 DIAGNOSIS — G8929 Other chronic pain: Secondary | ICD-10-CM | POA: Insufficient documentation

## 2023-02-05 MED ORDER — OXYCODONE HCL 5 MG PO TABS: 5.0000 mg | ORAL_TABLET | Freq: Three times a day (TID) | ORAL | 0 refills | Status: DC | PRN

## 2023-02-05 MED ORDER — ACETAMINOPHEN 325 MG PO TABS
650.0000 mg | ORAL_TABLET | Freq: Once | ORAL | Status: AC
  Administered 2023-02-05: 650 mg via ORAL
  Filled 2023-02-05: qty 2

## 2023-02-05 MED ORDER — OXYCODONE HCL 5 MG PO TABS
5.0000 mg | ORAL_TABLET | Freq: Once | ORAL | Status: AC
  Administered 2023-02-05: 5 mg via ORAL
  Filled 2023-02-05: qty 1

## 2023-02-05 NOTE — ED Provider Notes (Signed)
[facility] Provider Note    None    (approximate)   History   Knee Pain   HPI  Tony Williams is a 77 y.o. male with multiple comorbidities including diabetes, coronary artery disease status post CABG x 4, chronic gout, morbid obesity, chronic pain of both knees, COPD on 3 L home O2 who presents today for evaluation of bilateral knee pain, though left worse than right the past several days.  Patient denies any injury.  He reports he has a history of arthritis in both knees and receives cortisone injections in both.  Patient Active Problem List   Diagnosis Date Noted   Stage 3b chronic kidney disease (CKD) (HCC) 08/10/2022   Acute gout due to renal impairment involving toe of left foot 05/31/2022   Hx of CABG 10/29/2021   Chronic respiratory failure with hypoxia (HCC) 10/28/2021   Unstable angina (HCC) 10/28/2021   Senile purpura (HCC) 08/06/2021   OSA (obstructive sleep apnea) 05/29/2021   Daytime sleepiness 02/12/2021   Idiopathic hypotension 12/16/2020   Hx of myocardial infarction 12/16/2020   Bradycardia 12/16/2020   Dizziness 12/16/2020   Symptomatic sinus bradycardia 12/08/2020   Dyspnea 11/15/2020   Acute on chronic diastolic CHF (congestive heart failure) (HCC) 11/10/2020   NSTEMI (non-ST elevated myocardial infarction) (HCC) 11/08/2020   Polyp of transverse colon    Morbid obesity (HCC) 07/21/2020   Chronic pain of both knees 07/21/2020   Diabetic polyneuropathy associated with type 2 diabetes mellitus (HCC) 07/21/2020   Chronic gout due to renal impairment without tophus 07/21/2020   Hypertension associated with diabetes (HCC) 07/21/2020   Hyperlipidemia associated with type 2 diabetes mellitus (HCC) 07/21/2020   GAD (generalized anxiety disorder) 07/21/2020   Psychophysiological insomnia 07/21/2020   Vitamin D insufficiency 04/18/2020   Chronic obstructive pulmonary disease (HCC) 04/18/2020   T2DM (type 2 diabetes mellitus) (HCC)  04/18/2020   Coronary artery disease involving coronary bypass graft of native heart without angina pectoris 04/18/2020   S/P primary angioplasty with coronary stent 04/18/2020   History of smoking at least 1 pack per day for at least 30 years 04/18/2020          Physical Exam   Triage Vital Signs: ED Triage Vitals  Encounter Vitals Group     BP 02/05/23 0657 (!) 154/70     Systolic BP Percentile --      Diastolic BP Percentile --      Pulse Rate 02/05/23 0657 65     Resp 02/05/23 0657 20     Temp 02/05/23 0657 98.1 F (36.7 C)     Temp Source 02/05/23 0657 Oral     SpO2 02/05/23 0657 90 %     Weight 02/05/23 0655 300 lb (136.1 kg)     Height 02/05/23 0655 5\' 10"  (1.778 m)     Head Circumference --      Peak Flow --      Pain Score 02/05/23 0655 8     Pain Loc --      Pain Education --      Exclude from Growth Chart --     Most recent vital signs: Vitals:   02/05/23 0657  BP: (!) 154/70  Pulse: 65  Resp: 20  Temp: 98.1 F (36.7 C)  SpO2: 90%    Physical Exam Vitals and nursing note reviewed.  Constitutional:      General: Awake and alert. No acute distress.    Appearance: Normal appearance. The patient is  normal weight.  HENT:     Head: Normocephalic and atraumatic.     Mouth: Mucous membranes are moist.  Eyes:     General: PERRL. Normal EOMs        Right eye: No discharge.        Left eye: No discharge.     Conjunctiva/sclera: Conjunctivae normal.  Cardiovascular:     Rate and Rhythm: Normal rate and regular rhythm.     Pulses: Normal pulses.  Pulmonary:     Effort: Pulmonary effort is normal. No respiratory distress.     Breath sounds: Normal breath sounds.  Abdominal:     Abdomen is soft. There is no abdominal tenderness. No rebound or guarding. No distention. Musculoskeletal:        General: No swelling. Normal range of motion.     Cervical back: Normal range of motion and neck supple.  Left knee: No deformity or rash.  No warmth or erythema.   Medial joint line tenderness. No patellar tenderness, no ballotment Warm and well perfused extremity with 2+ pedal pulses 5/5 strength to dorsiflexion and plantarflexion at the ankle with intact sensation throughout extremity Normal range of motion of the knee, with intact flexion and extension to active and passive range of motion though limited secondary to pain. Extensor mechanism intact. No ligamentous laxity. Negative anterior/posterior drawer/negative lachman, negative mcmurrays No effusion or warmth Intact quadriceps, hamstring function, patellar tendon function Pelvis stable Full ROM of ankle without pain or swelling Foot warm and well perfused Skin:    General: Skin is warm and dry.     Capillary Refill: Capillary refill takes less than 2 seconds.     Findings: No rash.  Neurological:     Mental Status: The patient is awake and alert.      ED Results / Procedures / Treatments   Labs (all labs ordered are listed, but only abnormal results are displayed) Labs Reviewed - No data to display   EKG     RADIOLOGY I independently reviewed and interpreted imaging and agree with radiologists findings.     PROCEDURES:  Critical Care performed:   Procedures   MEDICATIONS ORDERED IN ED: Medications  oxyCODONE (Oxy IR/ROXICODONE) immediate release tablet 5 mg (5 mg Oral Given 02/05/23 0731)  acetaminophen (TYLENOL) tablet 650 mg (650 mg Oral Given 02/05/23 0731)     12/23/2022 12/23/2022  1 Hydrocodone-Acetamin 5-325 Mg 35.00 30 An Bac [id number] [facility] (0202) 0/0 5.83 MME Medicare [place]  11/22/2022 11/11/2022  1 Pregabalin 100 Mg Capsule 180.00 90 An Bac [id number] [facility] (0202) 0/1 1.34 LME Medicare [place]  11/18/2022 02/08/2022  1 Gabapentin 300 Mg Capsule 270.00 90 An Bac [id number] [facility] (0202) 1/1  Medicare [place]  11/12/2022 11/11/2022  1 Hydrocodone-Acetamin 5-325 Mg 35.00 8 An Bac [id number] [facility] (0202) 0/0 21.88 MME Medicare [place]  11/04/2022 11/04/2022  1 Hydrocodone-Acetamin 5-325 Mg 30.00 7  An Bac [id number] [facility] (0202) 0/0 21.43 MME Medicare [place]  10/23/2022 09/24/2022  1 Pregabalin 100 Mg Capsule 60.00 30 An Bac [id number] [facility] (0202) 1/3 1.34 LME Medicare [place]  09/24/2022 09/24/2022  1 Pregabalin 100 Mg Capsule 60.00 30 An Bac [id number] [facility] (0202) 0/3 1.34 LME Medicare [place]  09/24/2022 09/24/2022  1 Hydrocodone-Acetamin 5-325 Mg 30.00 7 An Bac [id number] [facility] (0202) 0/0 21.43 MME Medicare [place]     IMPRESSION / MDM / ASSESSMENT AND PLAN / ED COURSE  I reviewed the triage vital signs and the nursing notes.   Differential diagnosis includes, but is  not limited to, osteoarthritis/degenerative changes, effusion, sprain, contusion, dislocation, fracture, joint infection, tendon rupture.   I reviewed the patient's chart.  Patient is prescribed hydrocodone/acetaminophen by outpatient provider, most recently filled 30-day supply on 12/23/2022.  Patient is awake and alert, hemodynamically stable and afebrile.  He is neurovascularly intact.  No evidence of neurological deficit or vascular compromise on exam. No fracture/dislocation on X-Ray. No deformity or obvious ligamentous laxity on exam. No effusion, warmth, erythema, or constitutional symptoms to suggest septic joint. Overall well appearing, vital signs stable. No indication for diagnostic or therapeutic procedure such as arthrocentesis.  He was treated symptomatically with significant improvement of his pain and this was also sent to his pharmacy.  He was advised that this medication is highly addictive and should only be used for breakthrough pain.  He was also advised that he cannot take this with the hydrocodone which is prescribed to him by his outpatient provider.  Also advised that he cannot drive, operate heavy machinery, or perform any test that require concentration while taking this medication.  Return precautions and care instructions discussed. Outpatient follow-up advised. Patient agrees with plan of care.  Patient's presentation is most consistent  with acute complicated illness / injury requiring diagnostic workup.   Clinical Course as of 02/05/23 1436  Sat Feb 05, 2023  0807 Patient reports significant relief of his pain [JP]    Clinical Course User Index [JP] Linday Rhodes, Herb Grays, PA-C     FINAL CLINICAL IMPRESSION(S) / ED DIAGNOSES   Final diagnoses:  Chronic pain of left knee     Rx / DC Orders   ED Discharge Orders          Ordered    oxyCODONE (ROXICODONE) 5 MG immediate release tablet  Every 8 hours PRN        02/05/23 0810             Note:  This document was prepared using Dragon voice recognition software and may include unintentional dictation errors.   Keturah Shavers 02/05/23 1437    Willy Eddy, MD 02/05/23 [id number]

## 2023-02-05 NOTE — Discharge Instructions (Signed)
Please follow-up with the orthopedic department, call them on Monday to make an appointment.  You may take the medication as prescribed to help with your pain, the remember this is highly addictive and should only be used for breakthrough pain when Tylenol does not work first.  Also remember that you cannot take this with any other narcotics or sedating medicines.  You cannot drive, operate heavy machinery, or perform any test that require concentration while taking this medication.  Please return for any new, worsening, or change in symptoms or other concerns.  It was a pleasure caring for you today.

## 2023-02-05 NOTE — ED Triage Notes (Signed)
Reports L knee pain x 4 days. No fall or injury. Reports hx of arthritis in both knees and has received cortisone shots in both. Ambulatory at baseline. Pt has COPD and presents with home oxygen on 3L which is pt baseline. Pt alert and oriented. Speaking in full sentences.

## 2023-02-07 ENCOUNTER — Ambulatory Visit: Payer: Self-pay

## 2023-02-07 DIAGNOSIS — G8929 Other chronic pain: Secondary | ICD-10-CM

## 2023-02-07 NOTE — Telephone Encounter (Signed)
Was prescribed hydrocodone/apap by Dr. B last week. Is he just wanting a refill?

## 2023-02-07 NOTE — Telephone Encounter (Signed)
Chief Complaint: Knee pain  Symptoms: left knee pain  Frequency: constant x2 weeks  Pertinent Negatives: Patient denies knee swell, chest pain, fever, redness, injury Disposition: [] ED /[] Urgent Care (no appt availability in office) / [] Appointment(In office/virtual)/ []  [place] [facility]/ [] Home Care/ [] Refused Recommended Disposition /[] [place] [facility]/ [x]  Follow-up with PCP Additional Notes: Patient states his left knee started to cause him pain and discomfort about 2 weeks ago. Patient reports that he went to the ED  on 02/05/23 was told he has arthritis in the knee and it is bone on bone causing the pain. Patient has an appointment scheduled with Ortho next Wednesday but patient states he is in severe pain right now and over the counter pain medication is not helping at all. Care advice was given and patient was offered an appointment in the office this week. Patient declined stating he only wants to see his PCP. Patient wants PCP to prescribe something for pain until his appointment next week with Ortho. Advised I would forward request to PCP for recommendations. Patient request Rx be sent to [facility] in [place] on [address]  Summary: pain medication   Pt has appt w/ ortho next Wed. 12/04 for knee pain.  Pt went to ED on Fri b/c the pain was so bad.  They told him it is bone on bone. Pt states he is going to need pain medication to get him through to his appt w/ ortho. Pt declined to see any other provider, and Dr B has no availability until after the ortho appt.  Please advise     Reason for Disposition  [1] MODERATE pain (e.g., interferes with normal activities, limping) AND [2] present > 3 days  Answer Assessment - Initial Assessment Questions 1. LOCATION and RADIATION: "Where is the pain located?"      Left knee  2. QUALITY: "What does the pain feel like?"  (e.g., sharp, dull, aching, burning)     It's like a knife is sticking the knee Sharp pain  3.  SEVERITY: "How bad is the pain?" "What does it keep you from doing?"   (Scale 1-10; or mild, moderate, severe)   -  MILD (1-3): doesn't interfere with normal activities    -  MODERATE (4-7): interferes with normal activities (e.g., work or school) or awakens from sleep, limping    -  SEVERE (8-10): excruciating pain, unable to do any normal activities, unable to walk     8/10 4. ONSET: "When did the pain start?" "Does it come and go, or is it there all the time?"     2 weeks ago  5. RECURRENT: "Have you had this pain before?" If Yes, ask: "When, and what happened then?"     No, not really  6. SETTING: "Has there been any recent work, exercise or other activity that involved that part of the body?"      No  7. AGGRAVATING FACTORS: "What makes the knee pain worse?" (e.g., walking, climbing stairs, running)     Climbing the stairs or walking too much  8. ASSOCIATED SYMPTOMS: "Is there any swelling or redness of the knee?"     No  9. OTHER SYMPTOMS: "Do you have any other symptoms?" (e.g., chest pain, difficulty breathing, fever, calf pain)     No  Protocols used: Knee Pain-A-AH

## 2023-02-07 NOTE — Telephone Encounter (Signed)
Summary: pain medication   Pt has appt w/ ortho next Wed. 12/04 for knee pain.  Pt went to ED on Fri b/c the pain was so bad.  They told him it is bone on bone. Pt states he is going to need pain medication to get him through to his appt w/ ortho. Pt declined to see any other provider, and Dr B has no availability until after the ortho appt.  Please advise    Called pt - Some one picked up and then phone went dead.  Called back - call went to VM. Left message on machine.

## 2023-02-08 NOTE — Telephone Encounter (Signed)
Pt's wife called requesting a prescription for steroids for her husband. She said her husband will not be seeing the Orthopedic doctor for several days (02/16/23). Wife stated pt does not need a pain med refill at this time.

## 2023-02-09 MED ORDER — PREDNISONE 10 MG PO TABS: ORAL_TABLET | ORAL | 0 refills | Status: DC

## 2023-02-09 NOTE — Addendum Note (Signed)
Addended by: Malva Limes on: 02/09/2023 09:51 AM   Modules accepted: Orders

## 2023-02-14 ENCOUNTER — Emergency Department: Payer: Medicare Other

## 2023-02-14 ENCOUNTER — Other Ambulatory Visit: Payer: Self-pay

## 2023-02-14 ENCOUNTER — Ambulatory Visit: Payer: Medicare Other | Admitting: Family Medicine

## 2023-02-14 ENCOUNTER — Inpatient Hospital Stay
Admission: EM | Admit: 2023-02-14 | Discharge: 2023-03-15 | DRG: 280 | Disposition: A | Discharge status: Home / Self Care | Payer: Medicare Other | Attending: Internal Medicine | Admitting: Internal Medicine

## 2023-02-14 ENCOUNTER — Inpatient Hospital Stay: Payer: Medicare Other

## 2023-02-14 DIAGNOSIS — Y738 Miscellaneous gastroenterology and urology devices associated with adverse incidents, not elsewhere classified: Secondary | ICD-10-CM | POA: Diagnosis not present

## 2023-02-14 DIAGNOSIS — I130 Hypertensive heart and chronic kidney disease with heart failure and stage 1 through stage 4 chronic kidney disease, or unspecified chronic kidney disease: Secondary | ICD-10-CM | POA: Diagnosis present

## 2023-02-14 DIAGNOSIS — Z7902 Long term (current) use of antithrombotics/antiplatelets: Secondary | ICD-10-CM

## 2023-02-14 DIAGNOSIS — Z888 Allergy status to other drugs, medicaments and biological substances status: Secondary | ICD-10-CM

## 2023-02-14 DIAGNOSIS — E873 Alkalosis: Secondary | ICD-10-CM | POA: Diagnosis not present

## 2023-02-14 DIAGNOSIS — E1121 Type 2 diabetes mellitus with diabetic nephropathy: Secondary | ICD-10-CM | POA: Diagnosis not present

## 2023-02-14 DIAGNOSIS — Z8551 Personal history of malignant neoplasm of bladder: Secondary | ICD-10-CM

## 2023-02-14 DIAGNOSIS — F32A Depression, unspecified: Secondary | ICD-10-CM | POA: Diagnosis present

## 2023-02-14 DIAGNOSIS — I5043 Acute on chronic combined systolic (congestive) and diastolic (congestive) heart failure: Secondary | ICD-10-CM | POA: Diagnosis not present

## 2023-02-14 DIAGNOSIS — I509 Heart failure, unspecified: Principal | ICD-10-CM

## 2023-02-14 DIAGNOSIS — M1712 Unilateral primary osteoarthritis, left knee: Secondary | ICD-10-CM | POA: Diagnosis present

## 2023-02-14 DIAGNOSIS — Y658 Other specified misadventures during surgical and medical care: Secondary | ICD-10-CM | POA: Diagnosis not present

## 2023-02-14 DIAGNOSIS — R0989 Other specified symptoms and signs involving the circulatory and respiratory systems: Secondary | ICD-10-CM | POA: Diagnosis not present

## 2023-02-14 DIAGNOSIS — I5A Non-ischemic myocardial injury (non-traumatic): Secondary | ICD-10-CM | POA: Diagnosis present

## 2023-02-14 DIAGNOSIS — J439 Emphysema, unspecified: Secondary | ICD-10-CM | POA: Diagnosis not present

## 2023-02-14 DIAGNOSIS — F418 Other specified anxiety disorders: Secondary | ICD-10-CM | POA: Diagnosis not present

## 2023-02-14 DIAGNOSIS — G4733 Obstructive sleep apnea (adult) (pediatric): Secondary | ICD-10-CM | POA: Diagnosis present

## 2023-02-14 DIAGNOSIS — J449 Chronic obstructive pulmonary disease, unspecified: Secondary | ICD-10-CM | POA: Diagnosis not present

## 2023-02-14 DIAGNOSIS — Z794 Long term (current) use of insulin: Secondary | ICD-10-CM | POA: Diagnosis not present

## 2023-02-14 DIAGNOSIS — N139 Obstructive and reflux uropathy, unspecified: Secondary | ICD-10-CM | POA: Diagnosis present

## 2023-02-14 DIAGNOSIS — I5023 Acute on chronic systolic (congestive) heart failure: Secondary | ICD-10-CM | POA: Diagnosis not present

## 2023-02-14 DIAGNOSIS — R710 Precipitous drop in hematocrit: Secondary | ICD-10-CM | POA: Diagnosis not present

## 2023-02-14 DIAGNOSIS — Z515 Encounter for palliative care: Secondary | ICD-10-CM

## 2023-02-14 DIAGNOSIS — I214 Non-ST elevation (NSTEMI) myocardial infarction: Principal | ICD-10-CM | POA: Diagnosis present

## 2023-02-14 DIAGNOSIS — E1159 Type 2 diabetes mellitus with other circulatory complications: Secondary | ICD-10-CM | POA: Diagnosis not present

## 2023-02-14 DIAGNOSIS — J432 Centrilobular emphysema: Secondary | ICD-10-CM | POA: Diagnosis not present

## 2023-02-14 DIAGNOSIS — I5033 Acute on chronic diastolic (congestive) heart failure: Secondary | ICD-10-CM | POA: Diagnosis present

## 2023-02-14 DIAGNOSIS — G9341 Metabolic encephalopathy: Secondary | ICD-10-CM | POA: Diagnosis not present

## 2023-02-14 DIAGNOSIS — Z885 Allergy status to narcotic agent status: Secondary | ICD-10-CM

## 2023-02-14 DIAGNOSIS — I420 Dilated cardiomyopathy: Secondary | ICD-10-CM

## 2023-02-14 DIAGNOSIS — E662 Morbid (severe) obesity with alveolar hypoventilation: Secondary | ICD-10-CM | POA: Diagnosis present

## 2023-02-14 DIAGNOSIS — Z87891 Personal history of nicotine dependence: Secondary | ICD-10-CM

## 2023-02-14 DIAGNOSIS — N3289 Other specified disorders of bladder: Secondary | ICD-10-CM | POA: Diagnosis not present

## 2023-02-14 DIAGNOSIS — I2510 Atherosclerotic heart disease of native coronary artery without angina pectoris: Secondary | ICD-10-CM | POA: Diagnosis not present

## 2023-02-14 DIAGNOSIS — Z79899 Other long term (current) drug therapy: Secondary | ICD-10-CM

## 2023-02-14 DIAGNOSIS — Z7952 Long term (current) use of systemic steroids: Secondary | ICD-10-CM

## 2023-02-14 DIAGNOSIS — J9622 Acute and chronic respiratory failure with hypercapnia: Secondary | ICD-10-CM | POA: Diagnosis not present

## 2023-02-14 DIAGNOSIS — E785 Hyperlipidemia, unspecified: Secondary | ICD-10-CM | POA: Diagnosis present

## 2023-02-14 DIAGNOSIS — R110 Nausea: Secondary | ICD-10-CM | POA: Insufficient documentation

## 2023-02-14 DIAGNOSIS — Z7984 Long term (current) use of oral hypoglycemic drugs: Secondary | ICD-10-CM

## 2023-02-14 DIAGNOSIS — N261 Atrophy of kidney (terminal): Secondary | ICD-10-CM | POA: Diagnosis not present

## 2023-02-14 DIAGNOSIS — R42 Dizziness and giddiness: Secondary | ICD-10-CM | POA: Diagnosis not present

## 2023-02-14 DIAGNOSIS — Z6839 Body mass index (BMI) 39.0-39.9, adult: Secondary | ICD-10-CM

## 2023-02-14 DIAGNOSIS — G9389 Other specified disorders of brain: Secondary | ICD-10-CM | POA: Diagnosis not present

## 2023-02-14 DIAGNOSIS — Z6841 Body Mass Index (BMI) 40.0 and over, adult: Secondary | ICD-10-CM | POA: Diagnosis not present

## 2023-02-14 DIAGNOSIS — N1832 Chronic kidney disease, stage 3b: Secondary | ICD-10-CM | POA: Diagnosis not present

## 2023-02-14 DIAGNOSIS — I517 Cardiomegaly: Secondary | ICD-10-CM | POA: Diagnosis not present

## 2023-02-14 DIAGNOSIS — R410 Disorientation, unspecified: Secondary | ICD-10-CM | POA: Insufficient documentation

## 2023-02-14 DIAGNOSIS — I152 Hypertension secondary to endocrine disorders: Secondary | ICD-10-CM | POA: Diagnosis not present

## 2023-02-14 DIAGNOSIS — F40240 Claustrophobia: Secondary | ICD-10-CM | POA: Diagnosis present

## 2023-02-14 DIAGNOSIS — R079 Chest pain, unspecified: Secondary | ICD-10-CM

## 2023-02-14 DIAGNOSIS — Z9079 Acquired absence of other genital organ(s): Secondary | ICD-10-CM

## 2023-02-14 DIAGNOSIS — N179 Acute kidney failure, unspecified: Secondary | ICD-10-CM | POA: Diagnosis present

## 2023-02-14 DIAGNOSIS — E1169 Type 2 diabetes mellitus with other specified complication: Secondary | ICD-10-CM | POA: Diagnosis not present

## 2023-02-14 DIAGNOSIS — I959 Hypotension, unspecified: Secondary | ICD-10-CM | POA: Diagnosis not present

## 2023-02-14 DIAGNOSIS — J984 Other disorders of lung: Secondary | ICD-10-CM | POA: Diagnosis not present

## 2023-02-14 DIAGNOSIS — R918 Other nonspecific abnormal finding of lung field: Secondary | ICD-10-CM | POA: Diagnosis not present

## 2023-02-14 DIAGNOSIS — R339 Retention of urine, unspecified: Secondary | ICD-10-CM | POA: Diagnosis not present

## 2023-02-14 DIAGNOSIS — E1122 Type 2 diabetes mellitus with diabetic chronic kidney disease: Secondary | ICD-10-CM | POA: Diagnosis present

## 2023-02-14 DIAGNOSIS — T83021A Displacement of indwelling urethral catheter, initial encounter: Secondary | ICD-10-CM | POA: Diagnosis not present

## 2023-02-14 DIAGNOSIS — T8383XA Hemorrhage of genitourinary prosthetic devices, implants and grafts, initial encounter: Secondary | ICD-10-CM | POA: Diagnosis not present

## 2023-02-14 DIAGNOSIS — M1A9XX0 Chronic gout, unspecified, without tophus (tophi): Secondary | ICD-10-CM | POA: Diagnosis present

## 2023-02-14 DIAGNOSIS — K5900 Constipation, unspecified: Secondary | ICD-10-CM | POA: Diagnosis present

## 2023-02-14 DIAGNOSIS — N189 Chronic kidney disease, unspecified: Secondary | ICD-10-CM | POA: Diagnosis present

## 2023-02-14 DIAGNOSIS — Z1152 Encounter for screening for COVID-19: Secondary | ICD-10-CM | POA: Diagnosis not present

## 2023-02-14 DIAGNOSIS — R0602 Shortness of breath: Secondary | ICD-10-CM | POA: Diagnosis not present

## 2023-02-14 DIAGNOSIS — R29818 Other symptoms and signs involving the nervous system: Secondary | ICD-10-CM | POA: Diagnosis not present

## 2023-02-14 DIAGNOSIS — M1A30X0 Chronic gout due to renal impairment, unspecified site, without tophus (tophi): Secondary | ICD-10-CM | POA: Diagnosis present

## 2023-02-14 DIAGNOSIS — E876 Hypokalemia: Secondary | ICD-10-CM | POA: Diagnosis not present

## 2023-02-14 DIAGNOSIS — F05 Delirium due to known physiological condition: Secondary | ICD-10-CM | POA: Diagnosis not present

## 2023-02-14 DIAGNOSIS — D62 Acute posthemorrhagic anemia: Secondary | ICD-10-CM | POA: Diagnosis present

## 2023-02-14 DIAGNOSIS — I252 Old myocardial infarction: Secondary | ICD-10-CM

## 2023-02-14 DIAGNOSIS — R54 Age-related physical debility: Secondary | ICD-10-CM | POA: Diagnosis present

## 2023-02-14 DIAGNOSIS — D631 Anemia in chronic kidney disease: Secondary | ICD-10-CM | POA: Diagnosis present

## 2023-02-14 DIAGNOSIS — I25110 Atherosclerotic heart disease of native coronary artery with unstable angina pectoris: Secondary | ICD-10-CM | POA: Diagnosis not present

## 2023-02-14 DIAGNOSIS — I5021 Acute systolic (congestive) heart failure: Secondary | ICD-10-CM

## 2023-02-14 DIAGNOSIS — Z7982 Long term (current) use of aspirin: Secondary | ICD-10-CM

## 2023-02-14 DIAGNOSIS — I110 Hypertensive heart disease with heart failure: Secondary | ICD-10-CM | POA: Diagnosis not present

## 2023-02-14 DIAGNOSIS — R4182 Altered mental status, unspecified: Secondary | ICD-10-CM | POA: Diagnosis not present

## 2023-02-14 DIAGNOSIS — Z9981 Dependence on supplemental oxygen: Secondary | ICD-10-CM

## 2023-02-14 DIAGNOSIS — R310 Gross hematuria: Secondary | ICD-10-CM | POA: Diagnosis not present

## 2023-02-14 DIAGNOSIS — J9621 Acute and chronic respiratory failure with hypoxia: Secondary | ICD-10-CM | POA: Diagnosis not present

## 2023-02-14 DIAGNOSIS — Z7189 Other specified counseling: Secondary | ICD-10-CM | POA: Diagnosis not present

## 2023-02-14 DIAGNOSIS — I25810 Atherosclerosis of coronary artery bypass graft(s) without angina pectoris: Secondary | ICD-10-CM | POA: Diagnosis present

## 2023-02-14 DIAGNOSIS — H538 Other visual disturbances: Secondary | ICD-10-CM | POA: Diagnosis not present

## 2023-02-14 DIAGNOSIS — E1129 Type 2 diabetes mellitus with other diabetic kidney complication: Secondary | ICD-10-CM | POA: Diagnosis present

## 2023-02-14 DIAGNOSIS — R627 Adult failure to thrive: Secondary | ICD-10-CM | POA: Diagnosis present

## 2023-02-14 DIAGNOSIS — E66813 Obesity, class 3: Secondary | ICD-10-CM | POA: Diagnosis present

## 2023-02-14 LAB — BASIC METABOLIC PANEL
Anion gap: 10 (ref 5–15)
BUN: 61 mg/dL — ABNORMAL HIGH (ref 8–23)
CO2: 27 mmol/L (ref 22–32)
Calcium: 9.2 mg/dL (ref 8.9–10.3)
Chloride: 103 mmol/L (ref 98–111)
Creatinine, Ser: 2.79 mg/dL — ABNORMAL HIGH (ref 0.61–1.24)
GFR, Estimated: 23 mL/min — ABNORMAL LOW (ref >60)
Glucose, Bld: 82 mg/dL (ref 70–99)
Potassium: 4.6 mmol/L (ref 3.5–5.1)
Sodium: 140 mmol/L (ref 135–145)

## 2023-02-14 LAB — CBC
HCT: 43.1 % (ref 39.0–52.0)
Hemoglobin: 13.3 g/dL (ref 13.0–17.0)
MCH: 28.5 pg (ref 26.0–34.0)
MCHC: 30.9 g/dL (ref 30.0–36.0)
MCV: 92.3 fL (ref 80.0–100.0)
Platelets: 228 10*3/uL (ref 150–400)
RBC: 4.67 MIL/uL (ref 4.22–5.81)
RDW: 17.3 % — ABNORMAL HIGH (ref 11.5–15.5)
WBC: 9 10*3/uL (ref 4.0–10.5)
nRBC: 0 % (ref 0.0–0.2)

## 2023-02-14 LAB — PROTIME-INR
INR: 1.1 (ref 0.8–1.2)
Prothrombin Time: 14.6 s (ref 11.4–15.2)

## 2023-02-14 LAB — CBG MONITORING, ED: Glucose-Capillary: 117 mg/dL — ABNORMAL HIGH (ref 70–99)

## 2023-02-14 LAB — SARS CORONAVIRUS 2 BY RT PCR: SARS Coronavirus 2 by RT PCR: NEGATIVE

## 2023-02-14 LAB — TROPONIN I (HIGH SENSITIVITY)
Troponin I (High Sensitivity): 609 ng/L (ref <18)
Troponin I (High Sensitivity): 550 ng/L (ref <18)

## 2023-02-14 LAB — BRAIN NATRIURETIC PEPTIDE: B Natriuretic Peptide: 627.9 pg/mL — ABNORMAL HIGH (ref 0.0–100.0)

## 2023-02-14 MED ORDER — ASPIRIN 81 MG PO CHEW
324.0000 mg | CHEWABLE_TABLET | Freq: Once | ORAL | Status: AC
  Administered 2023-02-14: 324 mg via ORAL
  Filled 2023-02-14: qty 4

## 2023-02-14 MED ORDER — OXYCODONE HCL 5 MG PO TABS
5.0000 mg | ORAL_TABLET | Freq: Four times a day (QID) | ORAL | Status: DC | PRN
  Administered 2023-02-14 – 2023-02-17 (×7): 5 mg via ORAL
  Filled 2023-02-14 (×7): qty 1

## 2023-02-14 MED ORDER — INSULIN ASPART 100 UNIT/ML IJ SOLN
0.0000 [IU] | Freq: Three times a day (TID) | INTRAMUSCULAR | Status: DC
  Administered 2023-02-16 – 2023-02-17 (×2): 2 [IU] via SUBCUTANEOUS
  Administered 2023-02-17 – 2023-02-18 (×3): 1 [IU] via SUBCUTANEOUS
  Administered 2023-02-19: 2 [IU] via SUBCUTANEOUS
  Administered 2023-02-19: 1 [IU] via SUBCUTANEOUS
  Filled 2023-02-14 (×7): qty 1

## 2023-02-14 MED ORDER — DM-GUAIFENESIN ER 30-600 MG PO TB12: 1.0000 | ORAL_TABLET | Freq: Two times a day (BID) | ORAL | Status: DC | PRN

## 2023-02-14 MED ORDER — ONDANSETRON HCL 4 MG/2ML IJ SOLN
4.0000 mg | Freq: Three times a day (TID) | INTRAMUSCULAR | Status: DC | PRN
  Administered 2023-02-20 – 2023-03-07 (×16): 4 mg via INTRAVENOUS
  Filled 2023-02-14 (×16): qty 2

## 2023-02-14 MED ORDER — ACETAMINOPHEN 325 MG PO TABS
650.0000 mg | ORAL_TABLET | Freq: Four times a day (QID) | ORAL | Status: DC | PRN
  Administered 2023-02-16 – 2023-03-14 (×8): 650 mg via ORAL
  Filled 2023-02-14 (×10): qty 2

## 2023-02-14 MED ORDER — IPRATROPIUM-ALBUTEROL 0.5-2.5 (3) MG/3ML IN SOLN
3.0000 mL | Freq: Four times a day (QID) | RESPIRATORY_TRACT | Status: DC
  Administered 2023-02-14 – 2023-02-16 (×6): 3 mL via RESPIRATORY_TRACT
  Filled 2023-02-14 (×7): qty 3

## 2023-02-14 MED ORDER — ALBUTEROL SULFATE (2.5 MG/3ML) 0.083% IN NEBU: 2.5000 mg | INHALATION_SOLUTION | RESPIRATORY_TRACT | Status: DC | PRN

## 2023-02-14 MED ORDER — FUROSEMIDE 10 MG/ML IJ SOLN
40.0000 mg | Freq: Two times a day (BID) | INTRAMUSCULAR | Status: DC
  Administered 2023-02-15 – 2023-02-16 (×3): 40 mg via INTRAVENOUS
  Filled 2023-02-14 (×3): qty 4

## 2023-02-14 MED ORDER — HYDRALAZINE HCL 20 MG/ML IJ SOLN: 5.0000 mg | INTRAMUSCULAR | Status: DC | PRN

## 2023-02-14 MED ORDER — INSULIN ASPART 100 UNIT/ML IJ SOLN
0.0000 [IU] | Freq: Every day | INTRAMUSCULAR | Status: DC
  Filled 2023-02-14 (×2): qty 1

## 2023-02-14 MED ORDER — HEPARIN (PORCINE) 25000 UT/250ML-% IV SOLN
1800.0000 [IU]/h | INTRAVENOUS | Status: AC
  Administered 2023-02-14: 1400 [IU]/h via INTRAVENOUS
  Administered 2023-02-15 – 2023-02-16 (×3): 1800 [IU]/h via INTRAVENOUS
  Filled 2023-02-14 (×4): qty 250

## 2023-02-14 MED ORDER — FUROSEMIDE 10 MG/ML IJ SOLN
40.0000 mg | Freq: Once | INTRAMUSCULAR | Status: AC
  Administered 2023-02-14: 40 mg via INTRAVENOUS
  Filled 2023-02-14: qty 4

## 2023-02-14 MED ORDER — OXYCODONE-ACETAMINOPHEN 5-325 MG PO TABS
1.0000 | ORAL_TABLET | Freq: Once | ORAL | Status: AC
  Administered 2023-02-14: 1 via ORAL
  Filled 2023-02-14: qty 1

## 2023-02-14 MED ORDER — HEPARIN BOLUS VIA INFUSION
4000.0000 [IU] | Freq: Once | INTRAVENOUS | Status: AC
  Administered 2023-02-14: 4000 [IU] via INTRAVENOUS
  Filled 2023-02-14: qty 4000

## 2023-02-14 NOTE — H&P (Signed)
History and Physical    Tony Williams GNF:[id number] DOB: Oct 10, 1945 DOA: 02/14/2023  Referring MD/NP/PA:   PCP: Erasmo Downer, MD   Patient coming from:  The patient is coming from home.     Chief Complaint: SOB  HPI: Tony Williams is a 77 y.o. male with medical history significant of dCHF, HLD, HTN, DM, COPD on 3L oxygen, CAD (s/p of CABG and stent), gout, CKD-3b, depression with anxiety, morbid obesity, OSA, former smoker, bilateral cancer, who presents with SOB.  Patient states that he has shortness of breath in the past several weeks which has been progressively worsening.  Patient is dry cough, no fever or chills.  He had mild substernal chest pain earlier, which has resolved. Patient also has worsening bilateral lower leg edema and 15 pounds of weight gain recently.  Patient has nausea, no vomiting, diarrhea or abdominal pain.  No symptoms of UTI.  Patient states he has intermittent bilateral blurry vision for more than 3 weeks.  He reports mild numbness in both feet, and generalized weakness.  No unilateral weakness in extremities.  No facial droop or slurred speech.  Patient is normally using 3 L oxygen at baseline, but found to have acute respiratory distress, with oxygen desaturation to 82% and difficulty speaking in full sentence, currently requiring 6 L oxygen with 93% of saturation.  Data reviewed independently and ED Course: pt was found to have BNP 627.9, troponin 609, worsening renal function with creatinine 2.79, BUN 61, GFR 23 (recent baseline creatinine 2.12 on 08/31/2022).  Temperature normal, blood pressure 125/54, heart rate 68, RR 17.  Patient is admitted to PCU as inpatient. Consulted Dr. Jimmey Ralph of card who will inform provider tomorrow.     CXR: Cardiomegaly with vascular congestion and probable mild interstitial edema. Chronic pleural and parenchymal scarring in the right thorax  EKG: I have personally reviewed.  Sinus rhythm, QTc 444, PVC, low  voltage, artificial effects.   Review of Systems:   General: no fevers, chills, no body weight gain, has fatigue HEENT: no blurry vision, hearing changes or sore throat Respiratory: has dyspnea, coughing, no wheezing CV: had mild chest pain, no palpitations GI: has nausea, no vomiting, abdominal pain, diarrhea, constipation GU: no dysuria, burning on urination, increased urinary frequency, hematuria  Ext: has leg edema Neuro: no unilateral weakness, numbness, or tingling, no hearing loss. Has blurry vision Skin: no rash, no skin tear. MSK: No muscle spasm, no deformity, no limitation of range of movement in spin Heme: No easy bruising.  Travel history: No recent long distant travel.   Allergy:  Allergies  Allergen Reactions   Dilaudid [Hydromorphone] Hives    Rash and itch   Butorphanol Itching   Morphine Other (See Comments)    Redness around IV site   Pedi-Pre Tape Spray [Wound Dressing Adhesive] Itching   Statins Other (See Comments)    "Destroyed my muscles"   Zocor [Simvastatin] Other (See Comments)    "Destroyed my muscles"    Past Medical History:  Diagnosis Date   Bladder cancer (HCC) 2013   CAD (coronary artery disease)    a. 2007 CABG x 4: LIMA->D1, VG->OM1, VG->D2->mLAD; b. 2018 NSTEMI/PCI: DES to VG->OM; c. 10/2020 NSTEMI/PCI: LM 90d, LAD 110m, D1 100, LCX 100ost/p, RCA sev diff dzs, VG->D2->mLAD 99/90 before D2 (4.0x15 Onyx Frontier DES), 90 before mLAD (4.0x15 Onyx Frontier DES), LIMA->D1 ok, VG->OM1 100.   Chronic heart failure with preserved ejection fraction (HFpEF) (HCC)    a. 10/2020 Echo: EF 50-55%,  apical HK, GrII DD, mod dil LA; b. 11/2020 Echo: EF 60-65%, no rwma.   Chronic kidney disease    COPD (chronic obstructive pulmonary disease) (HCC)    Depression    Diabetes mellitus without complication (HCC)    Hx of CABG x 4    LIMA-D2, SVG-OM (occluded), SeqSVG-D1-LAD.   Labile hypertension    Myocardial infarction [facility])     Past Surgical History:   Procedure Laterality Date   CHOLECYSTECTOMY, LAPAROSCOPIC  12/26/1995   COLONOSCOPY WITH PROPOFOL N/A 09/23/2020   Procedure: COLONOSCOPY WITH PROPOFOL;  Surgeon: Midge Minium, MD;  Location: [facility] ENDOSCOPY;  Service: Endoscopy;  Laterality: N/A;   CORONARY ARTERY BYPASS GRAFT     LIMA-D2, SVG-OM, SeqSVG-D1-LAD   CORONARY STENT INTERVENTION N/A 11/12/2020   Procedure: CORONARY STENT INTERVENTION;  Surgeon: Iran Ouch, MD;  Location: [facility] INVASIVE CV LAB;  Service: Cardiovascular;  Laterality: N/A;   EYE SURGERY     IABP INSERTION N/A 11/11/2020   Procedure: IABP Insertion;  Surgeon: Yvonne Kendall, MD;  Location: [facility] INVASIVE CV LAB;  Service: Cardiovascular;  Laterality: N/A;   IR STENT PLACEMENT ANTE CAROTID INC ANGIO     KNEE SURGERY     RIGHT/LEFT HEART CATH AND CORONARY/GRAFT ANGIOGRAPHY N/A 11/11/2020   Procedure: RIGHT/LEFT HEART CATH AND CORONARY/GRAFT ANGIOGRAPHY;  Surgeon: Yvonne Kendall, MD;  Location: [facility] INVASIVE CV LAB;  Service: Cardiovascular;  Severe native CAD: 90% dLMCA-100% mLAD/100% ost LCx CTO w/ Severe diffuse RCA disease. Widely patent LIMA-D1. Patent sequential SVG-D2-LAD with 99% mid graft & 90% LIMA-D2 @ anastomosis. previously stented SVG-OM - CTO. Mildly   TRANSURETHRAL RESECTION OF PROSTATE  1994   URINARY SPHINCTER IMPLANT  1995    Social History:  reports that he quit smoking about 19 years ago. His smoking use included cigarettes. He started smoking about 49 years ago. He has a 30 pack-year smoking history. He has never used smokeless tobacco. He reports that he does not drink alcohol and does not use drugs.  Family History:  Family History  Problem Relation Age of Onset   Heart Problems Mother    Heart Problems Father      Prior to Admission medications   Medication Sig Start Date End Date Taking? Authorizing Provider  acetaminophen (TYLENOL) 325 MG tablet Take 2 tablets (650 mg total) by mouth every 6 (six) hours as needed for mild pain (or  Fever >/= 101). 11/12/21   Rodolph Bong, MD  albuterol (PROVENTIL) (2.5 MG/3ML) 0.083% nebulizer solution Take 3 mLs (2.5 mg total) by nebulization every 6 (six) hours as needed for wheezing or shortness of breath. 08/04/22   Coralyn Helling, MD  albuterol (VENTOLIN HFA) 108 (90 Base) MCG/ACT inhaler INHALE 2 PUFFS INTO THE LUNGS EVERY 6 HOURS AS NEEDED FOR WHEEZING OR SHORTNESS OF BREATH 01/08/22   Bacigalupo, Marzella Schlein, MD  allopurinol (ZYLOPRIM) 300 MG tablet Take 1 tablet (300 mg total) by mouth daily. 08/10/22   Erasmo Downer, MD  amLODipine (NORVASC) 10 MG tablet TAKE 1 TABLET(10 MG) BY MOUTH DAILY 09/20/22   Erasmo Downer, MD  aspirin EC 81 MG tablet Take 81 mg by mouth daily. Swallow whole.    [provider]  Budeson-Glycopyrrol-Formoterol (BREZTRI AEROSPHERE) 160-9-4.8 MCG/ACT AERO Inhale 2 puffs into the lungs 2 (two) times daily. 01/27/21   Parrett, Virgel Bouquet, NP  Cholecalciferol (VITAMIN D3 PO) Take 4,000 Units by mouth.    [provider]  cloNIDine (CATAPRES) 0.1 MG tablet Take 0.1 mg by  mouth daily.    [provider]  clopidogrel (PLAVIX) 75 MG tablet TAKE 1 TABLET(75 MG) BY MOUTH DAILY 01/17/23   Beryle Flock, Marzella Schlein, MD  Coenzyme Q10 (CO Q-10) 400 MG CAPS Take 400 mg by mouth daily at 6 (six) AM.    [provider]  colchicine 0.6 MG tablet Take 1 tablet (0.6 mg total) by mouth 2 (two) times daily. 05/31/22   Jacky Kindle, FNP  desoximetasone (TOPICORT) 0.25 % cream APPLY TOPICALLY TO THE AFFECTED AREA TWICE DAILY 09/21/22   Erasmo Downer, MD  escitalopram (LEXAPRO) 20 MG tablet TAKE 1 TABLET(20 MG) BY MOUTH DAILY 01/21/23   Bacigalupo, Marzella Schlein, MD  fenofibrate (TRICOR) 145 MG tablet Take 1 tablet (145 mg total) by mouth daily. 07/24/21   Debbe Odea, MD  glimepiride (AMARYL) 2 MG tablet Take 1 tablet (2 mg total) by mouth daily with breakfast. TAKE 1 TABLET(1 MG) BY MOUTH DAILY WITH BREAKFAST 08/10/22   Bacigalupo, Marzella Schlein, MD   hydrALAZINE (APRESOLINE) 100 MG tablet TAKE 1 TABLET(100 MG) BY MOUTH THREE TIMES DAILY 01/21/23   Bacigalupo, Marzella Schlein, MD  HYDROcodone-acetaminophen (NORCO/VICODIN) 5-325 MG tablet Take 1 tablet by mouth every 6 (six) hours as needed for moderate pain (pain score 4-6). 01/31/23   Erasmo Downer, MD  isosorbide mononitrate (IMDUR) 30 MG 24 hr tablet Take 1 tablet (30 mg total) by mouth daily. 06/29/22   Furth, Cadence H, PA-C  loratadine (CLARITIN) 10 MG tablet Take 1 tablet (10 mg total) by mouth daily. 11/12/21   Rodolph Bong, MD  lovastatin (MEVACOR) 40 MG tablet TAKE 1 TABLET(40 MG) BY MOUTH DAILY 01/21/23   Bacigalupo, Marzella Schlein, MD  nitroGLYCERIN (NITROSTAT) 0.4 MG SL tablet Place 1 tablet (0.4 mg total) under the tongue every 5 (five) minutes as needed for chest pain. 10/26/21 06/29/22  Debbe Odea, MD  Omega-3 Fatty Acids (FISH OIL) 1200 MG CAPS Take 1,200 mg by mouth daily at 6 (six) AM.    [provider]  oxyCODONE (ROXICODONE) 5 MG immediate release tablet Take 1 tablet (5 mg total) by mouth every 8 (eight) hours as needed. 02/05/23 02/05/24  Poggi, Herb Grays, PA-C  predniSONE (DELTASONE) 10 MG tablet 6 tablets for 1 day, then 5 for 1 day, then 4 for 1 day, then 3 for 1 day, then 2 for 1 day then 1 for 1 day. 02/09/23 02/15/23  Malva Limes, MD  pregabalin (LYRICA) 100 MG capsule Take 1 capsule (100 mg total) by mouth 2 (two) times daily. 11/11/22   Erasmo Downer, MD  torsemide (DEMADEX) 20 MG tablet Take 1 tablet (20 mg total) by mouth daily. 12/15/22   Debbe Odea, MD  traZODone (DESYREL) 150 MG tablet TAKE 1 TABLET(150 MG) BY MOUTH AT BEDTIME 12/15/22   Erasmo Downer, MD    Physical Exam: Vitals:   02/14/23 2045 02/14/23 2130 02/14/23 2214 02/14/23 2300  BP:  (!) 155/67  (!) 149/60  Pulse: 73 73 67 72  Resp:  17  13  Temp:      TempSrc:      SpO2: 97% 96% 96% 96%  Weight:      Height:       General: Not in acute distress HEENT:        Eyes: PERRL, EOMI, no jaundice       ENT: No discharge from the ears and nose, no pharynx injection, no tonsillar enlargement.        Neck:  Difficult to assess JVD due to morbid obesity, no bruit, no mass felt. Heme: No neck lymph node enlargement. Cardiac: S1/S2, RRR, No murmurs, No gallops or rubs. Respiratory: has fine crackles bilaterally GI: Soft, nondistended, nontender, no rebound pain, no organomegaly, BS present. GU: No hematuria Ext: 3+ pitting leg edema bilaterally. 1+DP/PT pulse bilaterally. Musculoskeletal: No joint deformities, No joint redness or warmth, no limitation of ROM in spin. Skin: No rashes.  Neuro: Alert, oriented X3, cranial nerves II-XII grossly intact, moves all extremities normally Psych: Patient is not psychotic, no suicidal or hemocidal ideation.  Labs on Admission: I have personally reviewed following labs and imaging studies  CBC: Recent Labs  Lab 02/14/23 1642  WBC 9.0  HGB 13.3  HCT 43.1  MCV 92.3  PLT 228   Basic Metabolic Panel: Recent Labs  Lab 02/14/23 1642  NA 140  K 4.6  CL 103  CO2 27  GLUCOSE 82  BUN 61*  CREATININE 2.79*  CALCIUM 9.2   GFR: Estimated Creatinine Clearance: 32.2 mL/min (A) (by C-G formula based on SCr of 2.79 mg/dL (H)). Liver Function Tests: No results for input(s): "AST", "ALT", "ALKPHOS", "BILITOT", "PROT", "ALBUMIN" in the last 168 hours. No results for input(s): "LIPASE", "AMYLASE" in the last 168 hours. No results for input(s): "AMMONIA" in the last 168 hours. Coagulation Profile: Recent Labs  Lab 02/14/23 1901  INR 1.1   Cardiac Enzymes: No results for input(s): "CKTOTAL", "CKMB", "CKMBINDEX", "TROPONINI" in the last 168 hours. BNP (last 3 results) No results for input(s): "PROBNP" in the last 8760 hours. HbA1C: No results for input(s): "HGBA1C" in the last 72 hours. CBG: Recent Labs  Lab 02/14/23 2149  GLUCAP 117*   Lipid Profile: No results for input(s): "CHOL", "HDL", "LDLCALC",  "TRIG", "CHOLHDL", "LDLDIRECT" in the last 72 hours. Thyroid Function Tests: No results for input(s): "TSH", "T4TOTAL", "FREET4", "T3FREE", "THYROIDAB" in the last 72 hours. Anemia Panel: No results for input(s): "VITAMINB12", "FOLATE", "FERRITIN", "TIBC", "IRON", "RETICCTPCT" in the last 72 hours. Urine analysis:    Component Value Date/Time   COLORURINE STRAW (A) 08/31/2022 0729   APPEARANCEUR CLEAR (A) 08/31/2022 0729   LABSPEC 1.010 08/31/2022 0729   PHURINE 5.0 08/31/2022 0729   GLUCOSEU NEGATIVE 08/31/2022 0729   HGBUR NEGATIVE 08/31/2022 0729   BILIRUBINUR NEGATIVE 08/31/2022 0729   BILIRUBINUR negative 04/18/2020 1349   KETONESUR NEGATIVE 08/31/2022 0729   PROTEINUR NEGATIVE 08/31/2022 0729   UROBILINOGEN 0.2 04/18/2020 1349   NITRITE NEGATIVE 08/31/2022 0729   LEUKOCYTESUR NEGATIVE 08/31/2022 0729   Sepsis Labs: @LABRCNTIP (procalcitonin:4,lacticidven:4) ) Recent Results (from the past 240 hour(s))  SARS Coronavirus 2 by RT PCR (hospital order, performed in [facility] hospital lab) *cepheid single result test* Anterior Nasal Swab     Status: None   Collection Time: 02/14/23  7:01 PM   Specimen: Anterior Nasal Swab  Result Value Ref Range Status   SARS Coronavirus 2 by RT PCR NEGATIVE NEGATIVE Final    Comment: (NOTE) SARS-CoV-2 target nucleic acids are NOT DETECTED.  The SARS-CoV-2 RNA is generally detectable in upper and lower respiratory specimens during the acute phase of infection. The lowest concentration of SARS-CoV-2 viral copies this assay can detect is 250 copies / mL. A negative result does not preclude SARS-CoV-2 infection and should not be used as the sole basis for treatment or other patient management decisions.  A negative result may occur with improper specimen collection / handling, submission of specimen other than nasopharyngeal swab, presence of viral mutation(s) within the  areas targeted by this assay, and inadequate number of viral  copies (<250 copies / mL). A negative result must be combined with clinical observations, patient history, and epidemiological information.  Fact Sheet for Patients:   RoadLapTop.co.za  Fact Sheet for Healthcare Providers: http://kim-miller.com/  This test is not yet approved or  cleared by the [place] FDA and has been authorized for detection and/or diagnosis of SARS-CoV-2 by FDA under an Emergency Use Authorization (EUA).  This EUA will remain in effect (meaning this test can be used) for the duration of the COVID-19 declaration under Section 564(b)(1) of the Act, 21 U.S.C. section 360bbb-3(b)(1), unless the authorization is terminated or revoked sooner.  Performed at [facility], [address] [place], [place] [address]      Radiological Exams on Admission: DG Chest [facility] 1 View  Result Date: 02/14/2023 CLINICAL DATA:  Shortness of breath EXAM: PORTABLE CHEST 1 VIEW COMPARISON:  08/04/2022, 12/28/2021, 01/28/2021 FINDINGS: Cardio post sternotomy changes. Cardiomegaly with vascular congestion and probable mild interstitial edema. Chronic pleural and parenchymal scarring on the right. No visible pneumothorax. IMPRESSION: Cardiomegaly with vascular congestion and probable mild interstitial edema. Chronic pleural and parenchymal scarring in the right thorax Electronically Signed   By: Jasmine Pang M.D.   On: 02/14/2023 20:38   CT HEAD WO CONTRAST ( )  Result Date: 02/14/2023 CLINICAL DATA:  Neuro deficit with acute stroke suspected EXAM: CT HEAD WITHOUT CONTRAST TECHNIQUE: Contiguous axial images were obtained from the base of the skull through the vertex without intravenous contrast. RADIATION DOSE REDUCTION: This exam was performed according to the departmental dose-optimization program which includes automated exposure control, adjustment of the mA and/or kV according to patient size and/or use of iterative  reconstruction technique. COMPARISON:  11/10/2021 FINDINGS: Brain: No evidence of acute infarction, hemorrhage, hydrocephalus, extra-axial collection or mass lesion/mass effect. Chronic small vessel ischemia in the cerebral white matter that is unchanged. Small area of encephalomalacia in the anterior left frontal lobe, likely post ischemic, unchanged. Vascular: No hyperdense vessel or unexpected calcification. Skull: Normal. Negative for fracture or focal lesion. Sinuses/Orbits: No acute finding. IMPRESSION: No acute finding.  Stable chronic ischemic injury Electronically Signed   By: Tiburcio Pea M.D.   On: 02/14/2023 19:51      Assessment/Plan Principal Problem:   Acute on chronic diastolic CHF (congestive heart failure) (HCC) Active Problems:   Acute on chronic respiratory failure with hypoxia (HCC)   CAD (coronary artery disease)   Myocardial injury   Chronic obstructive pulmonary disease (HCC)   Blurry vision, bilateral   Hypertension associated with diabetes (HCC)   Hyperlipidemia associated with type 2 diabetes mellitus (HCC)   Type II diabetes mellitus with renal manifestations (HCC)   Stage 3b chronic kidney disease (CKD) (HCC)   Chronic gout due to renal impairment without tophus   Depression with anxiety   OSA (obstructive sleep apnea)   Morbid obesity (HCC)   Assessment and Plan:  Acute on chronic respiratory failure with hypoxia due to acute on chronic diastolic CHF (congestive heart failure) (HCC): 2D echo on 10/29/2021 showed EF 50-55% with grade 1 diastolic dysfunction.  Patient has 3+ leg edema, 15 pounds weight gain, elevated BNP 627, clinically consistent with CHF exacerbation.  -Will admit to PCU as inpatient -Lasix 40 mg bid by IV -2d echo -Daily weights -strict I/O's -Low salt diet -Fluid restriction -As needed bronchodilators for shortness of breath -Nasal cannula oxygen to maintain oxygen saturation above 93% -Consulted Dr. Jimmey Ralph of card who  will inform  provider tomorrow.   Hx of CAD (coronary artery disease) and myocardial injury vs. NSTEMI: trop 609. Pt had mild chest pain earlier, which has resolved.  Patient is s/p of CABG and stent placement. - IV heparin is started by EDP - Trend Trop - prn Nitroglycerin,  - Aspirin and plavx - imdur - pt is on lovastatin at home.  Patient is allergic to other statins.  His daughter agreed to bring his home Lovastin to the hospital. I informed RN that Pt can take his home lovastatin. - Risk factor stratification: will check FLP and A1C  - f/u 2d echo  Chronic obstructive pulmonary disease -Bronchodilators and PR Mucinex  Blurry vision, bilateral: Etiology is not clear. It has been going on for more than 3 weeks intermittently.  No unilateral weakness in extremities.  No facial droop or slurred speech.  I offered to do MRI, but the patient has claustrophobia, need open MRI.  Patient states that he wants to see eye doctor first before doing MRI. -Will get CT of head --> negative for acute intracranial abnormalities. -Patient is on aspirin and statin -Fall precaution  Hypertension associated with diabetes (HCC) -IV hydralazine as needed -Patient is on IV Lasix -Amlodipine, clonidine, oral hydralazine, Imdur,  Hyperlipidemia associated with type 2 diabetes mellitus (HCC) -Tricor and lovastatin  Type II diabetes mellitus with renal manifestations (HCC): Recent A1c 6.3, well-controlled.  Patient is taking glipizide at home. -Sliding scale insulin  Stage 3b chronic kidney disease (CKD) (HCC): Renal function is slightly worse than baseline.  Recent baseline creatinine 2.12 on 08/31/2022.  His creatinine is at 2.79, BUN 61, GFR 23 today.  May be due to cardiorenal syndrome. -Avoid using renal toxic medications. -Carefully monitoring renal function while patient is receiving IV Lasix.   Chronic gout due to renal impairment without tophus -Allopurinol and colchicine  Depression with  anxiety -Continue home medications  OSA (obstructive sleep apnea) -CPAP  Morbid obesity (HCC): Body weight 142.9 kg, BMI 45.2 -Encourage losing weight -Exercise and healthy diet       DVT ppx: on IV Heparin     Code Status: Full code   Family Communication: Yes, patient's daughter  at bed side.     Disposition Plan:  Anticipate discharge back to previous environment  Consults called:  Consulted Dr. Jimmey Ralph of card who will inform provider tomorrow.   Admission status and Level of care: Progressive:   as inpt       Dispo: The patient is from: Home              Anticipated d/c is to: Home              Anticipated d/c date is: 2 days              Patient currently is not medically stable to d/c.    Severity of Illness:  The appropriate patient status for this patient is INPATIENT. Inpatient status is judged to be reasonable and necessary in order to provide the required intensity of service to ensure the patient's safety. The patient's presenting symptoms, physical exam findings, and initial radiographic and laboratory data in the context of their chronic comorbidities is felt to place them at high risk for further clinical deterioration. Furthermore, it is not anticipated that the patient will be medically stable for discharge from the hospital within 2 midnights of admission.   * I certify that at the point of admission it is my clinical judgment that  the patient will require inpatient hospital care spanning beyond 2 midnights from the point of admission due to high intensity of service, high risk for further deterioration and high frequency of surveillance required.*       Date of Service 02/15/2023    Lorretta Harp [facility]   If 7PM-7AM, please contact night-coverage [email or web address] 02/15/2023, 12:30 AM\

## 2023-02-14 NOTE — ED Provider Notes (Signed)
[facility] Provider Note    Event Date/Time   First MD Initiated Contact with Patient 02/14/23 1655     (approximate)   History   Chief Complaint Shortness of Breath   HPI  Tony Williams is a 77 y.o. male with past medical history of diabetes, CAD status post CABG, CHF, COPD, and CKD who presents to the ED complaining of shortness of breath.  Patient reports that he has been feeling increasingly short of breath over the past couple of weeks with significant swelling in his legs.  He states that the swelling has now begun to affect his arms as well as his abdomen and he has noticed a weight gain of about 15 pounds over the past week.  He states he has been taking his fluid medicine as prescribed, also reports avoiding salt intake.  He denies any pain in his chest and has not had any fevers or cough.     Physical Exam   Triage Vital Signs: ED Triage Vitals  Encounter Vitals Group     BP 02/14/23 1625 (!) 125/54     Systolic BP Percentile --      Diastolic BP Percentile --      Pulse Rate 02/14/23 1625 68     Resp 02/14/23 1625 17     Temp 02/14/23 1625 98 F (36.7 C)     Temp Source 02/14/23 1625 Oral     SpO2 02/14/23 1625 93 %     Weight 02/14/23 1639 (!) 315 lb (142.9 kg)     Height 02/14/23 1639 5\' 10"  (1.778 m)     Head Circumference --      Peak Flow --      Pain Score 02/14/23 1639 0     Pain Loc --      Pain Education --      Exclude from Growth Chart --     Most recent vital signs: Vitals:   02/14/23 1625  BP: (!) 125/54  Pulse: 68  Resp: 17  Temp: 98 F (36.7 C)  SpO2: 93%    Constitutional: Alert and oriented. Eyes: Conjunctivae are normal. Head: Atraumatic. Nose: No congestion/rhinnorhea. Mouth/Throat: Mucous membranes are moist.  Cardiovascular: Normal rate, regular rhythm. Grossly normal heart sounds.  2+ radial pulses bilaterally. Respiratory: Normal respiratory effort.  No retractions. Lungs with crackles to  bilateral bases. Gastrointestinal: Soft and nontender. No distention. Musculoskeletal: No lower extremity tenderness, 2+ pitting edema to mid thighs bilaterally. Neurologic:  Normal speech and language. No gross focal neurologic deficits are appreciated.    ED Results / Procedures / Treatments   Labs (all labs ordered are listed, but only abnormal results are displayed) Labs Reviewed  BASIC METABOLIC PANEL - Abnormal; Notable for the following components:      Result Value   BUN 61 (*)    Creatinine, Ser 2.79 (*)    GFR, Estimated 23 (*)    All other components within normal limits  CBC - Abnormal; Notable for the following components:   RDW 17.3 (*)    All other components within normal limits  BRAIN NATRIURETIC PEPTIDE - Abnormal; Notable for the following components:   B Natriuretic Peptide 627.9 (*)    All other components within normal limits  TROPONIN I (HIGH SENSITIVITY) - Abnormal; Notable for the following components:   Troponin I (High Sensitivity) 609 (*)    All other components within normal limits  PROTIME-INR  HEPARIN LEVEL (UNFRACTIONATED)  HEMOGLOBIN A1C  LIPID  PANEL     EKG  ED ECG REPORT I, Chesley Noon, the attending physician, personally viewed and interpreted this ECG.   Date: 02/14/2023  EKG Time: 16:29  Rate: 68  Rhythm: normal sinus rhythm, occasional PVC noted, unifocal  Axis: Normal  Intervals:nonspecific intraventricular conduction delay  ST&T Change: Nonspecific ST/T abnormality  RADIOLOGY Chest x-ray reviewed and interpreted by me with pulmonary edema, no focal infiltrate.  PROCEDURES:  Critical Care performed: Yes, see critical care procedure note(s)  .Critical Care  Performed by: Chesley Noon, MD Authorized by: Chesley Noon, MD   Critical care provider statement:    Critical care time (minutes):  30   Critical care time was exclusive of:  Separately billable procedures and treating other patients and teaching time    Critical care was necessary to treat or prevent imminent or life-threatening deterioration of the following conditions:  Cardiac failure   Critical care was time spent personally by me on the following activities:  Development of treatment plan with patient or surrogate, discussions with consultants, evaluation of patient's response to treatment, examination of patient, ordering and review of laboratory studies, ordering and review of radiographic studies, ordering and performing treatments and interventions, pulse oximetry, re-evaluation of patient's condition and review of old charts   I assumed direction of critical care for this patient from another provider in my specialty: no     Care discussed with: admitting provider      MEDICATIONS ORDERED IN ED: Medications  furosemide (LASIX) injection 40 mg (has no administration in time range)  heparin bolus via infusion 4,000 Units (has no administration in time range)  heparin ADULT infusion 100 units/mL (25000 units/216mL) (has no administration in time range)  ipratropium-albuterol (DUONEB) 0.5-2.5 (3) MG/3ML nebulizer solution 3 mL (has no administration in time range)  albuterol (PROVENTIL) (2.5 MG/3ML) 0.083% nebulizer solution 2.5 mg (has no administration in time range)  dextromethorphan-guaiFENesin (MUCINEX DM) 30-600 MG per 12 hr tablet 1 tablet (has no administration in time range)  ondansetron (ZOFRAN) injection 4 mg (has no administration in time range)  hydrALAZINE (APRESOLINE) injection 5 mg (has no administration in time range)  acetaminophen (TYLENOL) tablet 650 mg (has no administration in time range)  insulin aspart (novoLOG) injection 0-9 Units (has no administration in time range)  insulin aspart (novoLOG) injection 0-5 Units (has no administration in time range)  aspirin chewable tablet 324 mg (324 mg Oral Given 02/14/23 1801)     IMPRESSION / MDM / ASSESSMENT AND PLAN / ED COURSE  I reviewed the triage vital signs and the  nursing notes.                              77 y.o. male with past medical history of diabetes, CAD status post CABG, CHF, COPD, and CKD who presents to the ED complaining of increasing difficulty breathing and leg swelling for the past couple of weeks.  Patient's presentation is most consistent with acute presentation with potential threat to life or bodily function.  Differential diagnosis includes, but is not limited to, CHF exacerbation, COPD exacerbation, ACS, PE, pneumonia, pneumothorax, anemia, AKI, electrolyte abnormality.  Patient chronically ill but nontoxic-appearing and in no acute distress, he was initially noted to be hypoxic in triage but it appears his oxygen compressor from home was not working.  Once placed on his usual 3 L nasal cannula, oxygen saturation stable at 93%.  He does appear fluid overloaded with lower  extremity edema and crackles to bilateral bases.  Chest x-ray and labs are pending at this time, EKG appears similar to previous.  Chest x-ray appears to show significant pulmonary edema by my read, no focal infiltrate noted and we will diurese with IV Lasix.  Labs with mild AKI but no acute electrolyte abnormality, BNP elevated consistent with CHF, no significant anemia or leukocytosis noted.  Patient does have elevated troponin at greater than 600, will give loading dose of aspirin and start on IV heparin due to concern for NSTEMI.  Case discussed with hospitalist for admission.      FINAL CLINICAL IMPRESSION(S) / ED DIAGNOSES   Final diagnoses:  Acute on chronic congestive heart failure, unspecified heart failure type (HCC)  NSTEMI (non-ST elevated myocardial infarction) (HCC)     Rx / DC Orders   ED Discharge Orders     None        Note:  This document was prepared using Dragon voice recognition software and may include unintentional dictation errors.   Chesley Noon, MD 02/14/23 1840

## 2023-02-14 NOTE — Progress Notes (Signed)
PHARMACY - ANTICOAGULATION CONSULT NOTE  Pharmacy Consult for heparin Indication: chest pain/ACS  Allergies  Allergen Reactions   Dilaudid [Hydromorphone] Hives    Rash and itch   Butorphanol Itching   Morphine Other (See Comments)    Redness around IV site   Pedi-Pre Tape Spray [Wound Dressing Adhesive] Itching   Statins Other (See Comments)    "Destroyed my muscles"   Zocor [Simvastatin] Other (See Comments)    "Destroyed my muscles"   Patient Measurements: Height: 5\' 10"  (177.8 cm) Weight: (!) 142.9 kg (315 lb) IBW/kg (Calculated) : 73 Heparin Dosing Weight: 106.7 kg  Vital Signs: Temp: 98 F (36.7 C) (12/02 1625) Temp Source: Oral (12/02 1625) BP: 125/54 (12/02 1625) Pulse Rate: 68 (12/02 1625)  Labs: Recent Labs    02/14/23 1642  HGB 13.3  HCT 43.1  PLT 228  CREATININE 2.79*  TROPONINIHS 609*   Estimated Creatinine Clearance: 32.2 mL/min (A) (by C-G formula based on SCr of 2.79 mg/dL (H)).  Medical History: Past Medical History:  Diagnosis Date   Bladder cancer (HCC) 2013   CAD (coronary artery disease)    a. 2007 CABG x 4: LIMA->D1, VG->OM1, VG->D2->mLAD; b. 2018 NSTEMI/PCI: DES to VG->OM; c. 10/2020 NSTEMI/PCI: LM 90d, LAD 134m, D1 100, LCX 100ost/p, RCA sev diff dzs, VG->D2->mLAD 99/90 before D2 (4.0x15 Onyx Frontier DES), 90 before mLAD (4.0x15 Onyx Frontier DES), LIMA->D1 ok, VG->OM1 100.   Chronic heart failure with preserved ejection fraction (HFpEF) (HCC)    a. 10/2020 Echo: EF 50-55%, apical HK, GrII DD, mod dil LA; b. 11/2020 Echo: EF 60-65%, no rwma.   Chronic kidney disease    COPD (chronic obstructive pulmonary disease) (HCC)    Depression    Diabetes mellitus without complication (HCC)    Hx of CABG x 4    LIMA-D2, SVG-OM (occluded), SeqSVG-D1-LAD.   Labile hypertension    Myocardial infarction [place])    Assessment:  77 y/o male presenting with shortness of breath and lower extremity edema. PMH significant for CAD s/p CABG, CHF, COPD, and  CKD. Pharmacy has been consulted to initiate heparin infusion. Per chart review, patient is not on anticoagulation prior to admission.  Baseline labs: hgb 13.3, plt 228, INR ordered  Goal of Therapy:  Heparin level 0.3-0.7 units/ml Monitor platelets by anticoagulation protocol: Yes   Plan:  Give 4000 units bolus x 1 Start heparin infusion at 1400 units/hr Check anti-Xa level in 8 hours and daily while on heparin Continue to monitor H&H and platelets  Thank you for involving pharmacy in this patient's care.   Rockwell Alexandria, PharmD Clinical Pharmacist 02/14/2023 6:25 PM

## 2023-02-14 NOTE — ED Triage Notes (Signed)
Pt sts that he has been SOB and ext swelling for the last several days. Pt presented to the ED with a 82% saturation with his mobile concentrator. Pt concentrator is not working approprietly. Pt is not on 6L o2 via [place] at this time in triage. Pt sts that he is feeling better.

## 2023-02-15 ENCOUNTER — Encounter: Payer: Self-pay | Admitting: Internal Medicine

## 2023-02-15 ENCOUNTER — Inpatient Hospital Stay (HOSPITAL_COMMUNITY)
Admit: 2023-02-15 | Discharge: 2023-02-15 | Disposition: A | Discharge status: Home / Self Care | Payer: Medicare Other | Attending: Internal Medicine | Admitting: Internal Medicine

## 2023-02-15 DIAGNOSIS — I5023 Acute on chronic systolic (congestive) heart failure: Secondary | ICD-10-CM | POA: Diagnosis not present

## 2023-02-15 DIAGNOSIS — R339 Retention of urine, unspecified: Secondary | ICD-10-CM

## 2023-02-15 DIAGNOSIS — I5043 Acute on chronic combined systolic (congestive) and diastolic (congestive) heart failure: Secondary | ICD-10-CM

## 2023-02-15 DIAGNOSIS — I5033 Acute on chronic diastolic (congestive) heart failure: Secondary | ICD-10-CM | POA: Diagnosis not present

## 2023-02-15 DIAGNOSIS — I509 Heart failure, unspecified: Secondary | ICD-10-CM

## 2023-02-15 LAB — ECHOCARDIOGRAM COMPLETE
AR max vel: 2.84 cm2
AV Area VTI: 2.6 cm2
AV Area mean vel: 2.47 cm2
AV Mean grad: 5 mm[Hg]
AV Peak grad: 9.5 mm[Hg]
Ao pk vel: 1.54 m/s
Area-P 1/2: 2.94 cm2
Height: 70 in
MV VTI: 2.07 cm2
S' Lateral: 5.4 cm
Weight: 5040 [oz_av]

## 2023-02-15 LAB — HEPARIN LEVEL (UNFRACTIONATED)
Heparin Unfractionated: 0.15 [IU]/mL — ABNORMAL LOW (ref 0.30–0.70)
Heparin Unfractionated: 0.46 [IU]/mL (ref 0.30–0.70)
Heparin Unfractionated: 0.6 [IU]/mL (ref 0.30–0.70)

## 2023-02-15 LAB — BASIC METABOLIC PANEL
Anion gap: 9 (ref 5–15)
BUN: 61 mg/dL — ABNORMAL HIGH (ref 8–23)
CO2: 25 mmol/L (ref 22–32)
Calcium: 8.2 mg/dL — ABNORMAL LOW (ref 8.9–10.3)
Chloride: 103 mmol/L (ref 98–111)
Creatinine, Ser: 2.79 mg/dL — ABNORMAL HIGH (ref 0.61–1.24)
GFR, Estimated: 23 mL/min — ABNORMAL LOW (ref >60)
Glucose, Bld: 111 mg/dL — ABNORMAL HIGH (ref 70–99)
Potassium: 4.4 mmol/L (ref 3.5–5.1)
Sodium: 137 mmol/L (ref 135–145)

## 2023-02-15 LAB — GLUCOSE, CAPILLARY: Glucose-Capillary: 178 mg/dL — ABNORMAL HIGH (ref 70–99)

## 2023-02-15 LAB — CBC
HCT: 37.8 % — ABNORMAL LOW (ref 39.0–52.0)
Hemoglobin: 11.6 g/dL — ABNORMAL LOW (ref 13.0–17.0)
MCH: 28.3 pg (ref 26.0–34.0)
MCHC: 30.7 g/dL (ref 30.0–36.0)
MCV: 92.2 fL (ref 80.0–100.0)
Platelets: 193 10*3/uL (ref 150–400)
RBC: 4.1 MIL/uL — ABNORMAL LOW (ref 4.22–5.81)
RDW: 17.2 % — ABNORMAL HIGH (ref 11.5–15.5)
WBC: 7.5 10*3/uL (ref 4.0–10.5)
nRBC: 0 % (ref 0.0–0.2)

## 2023-02-15 LAB — CBG MONITORING, ED
Glucose-Capillary: 84 mg/dL (ref 70–99)
Glucose-Capillary: 133 mg/dL — ABNORMAL HIGH (ref 70–99)
Glucose-Capillary: 134 mg/dL — ABNORMAL HIGH (ref 70–99)

## 2023-02-15 LAB — LIPID PANEL
Cholesterol: 131 mg/dL (ref 0–200)
HDL: 31 mg/dL — ABNORMAL LOW (ref >40)
LDL Cholesterol: 75 mg/dL (ref 0–99)
Total CHOL/HDL Ratio: 4.2 {ratio}
Triglycerides: 123 mg/dL (ref <150)
VLDL: 25 mg/dL (ref 0–40)

## 2023-02-15 LAB — MAGNESIUM: Magnesium: 2.2 mg/dL (ref 1.7–2.4)

## 2023-02-15 LAB — TROPONIN I (HIGH SENSITIVITY)
Troponin I (High Sensitivity): 535 ng/L (ref <18)
Troponin I (High Sensitivity): 541 ng/L (ref <18)
Troponin I (High Sensitivity): 458 ng/L (ref <18)
Troponin I (High Sensitivity): 314 ng/L (ref <18)

## 2023-02-15 LAB — HEMOGLOBIN A1C
Hgb A1c MFr Bld: 6.8 % — ABNORMAL HIGH (ref 4.8–5.6)
Mean Plasma Glucose: 148.46 mg/dL

## 2023-02-15 MED ORDER — ASPIRIN 81 MG PO TBEC
81.0000 mg | DELAYED_RELEASE_TABLET | Freq: Every day | ORAL | Status: DC
  Administered 2023-02-15 – 2023-02-27 (×12): 81 mg via ORAL
  Filled 2023-02-15 (×13): qty 1

## 2023-02-15 MED ORDER — ESCITALOPRAM OXALATE 10 MG PO TABS
20.0000 mg | ORAL_TABLET | Freq: Every day | ORAL | Status: DC
  Administered 2023-02-15 – 2023-03-15 (×28): 20 mg via ORAL
  Filled 2023-02-15 (×29): qty 2

## 2023-02-15 MED ORDER — HEPARIN BOLUS VIA INFUSION
3200.0000 [IU] | Freq: Once | INTRAVENOUS | Status: AC
  Administered 2023-02-15: 3200 [IU] via INTRAVENOUS
  Filled 2023-02-15: qty 3200

## 2023-02-15 MED ORDER — GABAPENTIN 300 MG PO CAPS
300.0000 mg | ORAL_CAPSULE | Freq: Three times a day (TID) | ORAL | Status: DC
  Administered 2023-02-15 – 2023-02-17 (×8): 300 mg via ORAL
  Filled 2023-02-15 (×8): qty 1

## 2023-02-15 MED ORDER — FENOFIBRATE 160 MG PO TABS
160.0000 mg | ORAL_TABLET | Freq: Every day | ORAL | Status: DC
  Administered 2023-02-15 – 2023-02-17 (×3): 160 mg via ORAL
  Filled 2023-02-15 (×4): qty 1

## 2023-02-15 MED ORDER — TRAZODONE HCL 50 MG PO TABS: 50.0000 mg | ORAL_TABLET | Freq: Every day | ORAL | Status: DC

## 2023-02-15 MED ORDER — AMLODIPINE BESYLATE 10 MG PO TABS
10.0000 mg | ORAL_TABLET | Freq: Every day | ORAL | Status: DC
  Administered 2023-02-15 – 2023-02-17 (×3): 10 mg via ORAL
  Filled 2023-02-15: qty 1
  Filled 2023-02-15: qty 2
  Filled 2023-02-15: qty 1

## 2023-02-15 MED ORDER — CARVEDILOL 3.125 MG PO TABS
3.1250 mg | ORAL_TABLET | Freq: Two times a day (BID) | ORAL | Status: DC
  Administered 2023-02-15 – 2023-02-17 (×5): 3.125 mg via ORAL
  Filled 2023-02-15 (×5): qty 1

## 2023-02-15 MED ORDER — EZETIMIBE 10 MG PO TABS
10.0000 mg | ORAL_TABLET | Freq: Every day | ORAL | Status: DC
  Administered 2023-02-15 – 2023-03-15 (×28): 10 mg via ORAL
  Filled 2023-02-15 (×29): qty 1

## 2023-02-15 MED ORDER — FENTANYL CITRATE PF 50 MCG/ML IJ SOSY
12.5000 ug | PREFILLED_SYRINGE | INTRAMUSCULAR | Status: DC | PRN
  Administered 2023-02-15 (×2): 12.5 ug via INTRAVENOUS
  Filled 2023-02-15 (×2): qty 1

## 2023-02-15 MED ORDER — TRAZODONE HCL 50 MG PO TABS
150.0000 mg | ORAL_TABLET | Freq: Every day | ORAL | Status: DC
Start: 2023-02-15 — End: 2023-02-20
  Administered 2023-02-15 – 2023-02-19 (×5): 150 mg via ORAL
  Filled 2023-02-15 (×6): qty 1

## 2023-02-15 MED ORDER — CO Q-10 400 MG PO CAPS: 400.0000 mg | ORAL_CAPSULE | Freq: Every day | ORAL | Status: DC

## 2023-02-15 MED ORDER — HYDRALAZINE HCL 50 MG PO TABS
100.0000 mg | ORAL_TABLET | Freq: Three times a day (TID) | ORAL | Status: DC
  Administered 2023-02-15 – 2023-02-16 (×4): 100 mg via ORAL
  Filled 2023-02-15 (×4): qty 2

## 2023-02-15 MED ORDER — VITAMIN D 25 MCG (1000 UNIT) PO TABS
4000.0000 [IU] | ORAL_TABLET | Freq: Every day | ORAL | Status: DC
  Administered 2023-02-15 – 2023-03-15 (×28): 4000 [IU] via ORAL
  Filled 2023-02-15 (×29): qty 4

## 2023-02-15 MED ORDER — NITROGLYCERIN 0.4 MG SL SUBL
0.4000 mg | SUBLINGUAL_TABLET | SUBLINGUAL | Status: DC | PRN
  Administered 2023-03-07 (×2): 0.4 mg via SUBLINGUAL
  Filled 2023-02-15: qty 1

## 2023-02-15 MED ORDER — LORATADINE 10 MG PO TABS
10.0000 mg | ORAL_TABLET | Freq: Every day | ORAL | Status: DC
  Administered 2023-02-15 – 2023-03-14 (×16): 10 mg via ORAL
  Filled 2023-02-15 (×28): qty 1

## 2023-02-15 MED ORDER — COLCHICINE 0.6 MG PO TABS
0.6000 mg | ORAL_TABLET | Freq: Two times a day (BID) | ORAL | Status: DC
  Administered 2023-02-15 – 2023-02-17 (×6): 0.6 mg via ORAL
  Filled 2023-02-15 (×7): qty 1

## 2023-02-15 MED ORDER — CLOPIDOGREL BISULFATE 75 MG PO TABS
75.0000 mg | ORAL_TABLET | Freq: Every day | ORAL | Status: DC
  Administered 2023-02-15 – 2023-02-19 (×5): 75 mg via ORAL
  Filled 2023-02-15 (×5): qty 1

## 2023-02-15 MED ORDER — PREGABALIN 50 MG PO CAPS
100.0000 mg | ORAL_CAPSULE | Freq: Two times a day (BID) | ORAL | Status: DC
  Administered 2023-02-15 – 2023-02-17 (×6): 100 mg via ORAL
  Filled 2023-02-15 (×6): qty 2

## 2023-02-15 MED ORDER — CLONIDINE HCL 0.1 MG PO TABS
0.1000 mg | ORAL_TABLET | Freq: Every day | ORAL | Status: DC
  Administered 2023-02-15: 0.1 mg via ORAL
  Filled 2023-02-15: qty 1

## 2023-02-15 MED ORDER — ALLOPURINOL 100 MG PO TABS
300.0000 mg | ORAL_TABLET | Freq: Every day | ORAL | Status: DC
  Administered 2023-02-15 – 2023-02-26 (×11): 300 mg via ORAL
  Filled 2023-02-15: qty 1
  Filled 2023-02-15 (×10): qty 3
  Filled 2023-02-15: qty 1
  Filled 2023-02-15: qty 3

## 2023-02-15 MED ORDER — ISOSORBIDE MONONITRATE ER 30 MG PO TB24
30.0000 mg | ORAL_TABLET | Freq: Every day | ORAL | Status: DC
  Administered 2023-02-15 – 2023-02-21 (×7): 30 mg via ORAL
  Filled 2023-02-15 (×13): qty 1

## 2023-02-15 MED ORDER — PERFLUTREN LIPID MICROSPHERE
1.0000 mL | INTRAVENOUS | Status: AC | PRN
  Administered 2023-02-15: 8 mL via INTRAVENOUS

## 2023-02-15 NOTE — Progress Notes (Signed)
Patient C/O increased ABD pressure and pain. Patient bladder scanned for 838/840 ml. Patient has urinary Sphincter Implant and states Urology would have to turn off Sphincter and place catheter.  Geradine Girt NP notified. Ok per [place] to In and Out cath patient with 77 F.  Urology to be consulted. Patient st cath for 800 ml. Patient tolerated well.

## 2023-02-15 NOTE — Consult Note (Signed)
Urology Consult  77 y.o. male admitted with CHF and developed urinary retention with complaints of bladder fullness.  Bladder scan was 840 mL and he was in/out catheterized without difficulty with a 14 French catheter  History of TURP ~ 1995 in [facility] with subsequent urinary incontinence and underwent AUS by Dr. Colon Branch at [facility] around 1997.  No previous episodes of urinary retention.  Exam: General-no acute distress GU-phallus without lesions.  Testes descended bilaterally without masses or tenderness.  Scrotal pump easily palpable and the sphincter was deactivated.  2 minutes after sphincter deactivation the scrotal pump was palpated and the dimple remained indicating deactivation   Assessment/Recommendation:  1.  Urinary retention Sphincter was deactivated and if he has persistent urinary retention okay to place indwelling Foley catheter Please call for any questions or concerns   Irineo Axon, MD

## 2023-02-15 NOTE — Progress Notes (Signed)
PHARMACY - ANTICOAGULATION CONSULT NOTE  Pharmacy Consult for heparin Indication: chest pain/ACS  Allergies  Allergen Reactions   Dilaudid [Hydromorphone] Hives    Rash and itch   Butorphanol Itching   Morphine Other (See Comments)    Redness around IV site   Pedi-Pre Tape Spray [Wound Dressing Adhesive] Itching   Statins Other (See Comments)    "Destroyed my muscles"   Zocor [Simvastatin] Other (See Comments)    "Destroyed my muscles"   Patient Measurements: Height: 5\' 10"  (177.8 cm) Weight: (!) 142.9 kg (315 lb 0.6 oz) IBW/kg (Calculated) : 73 Heparin Dosing Weight: 106.7 kg  Vital Signs: Temp: 97.5 F (36.4 C) (12/03 1734) Temp Source: Oral (12/03 1734) BP: 124/51 (12/03 1734) Pulse Rate: 72 (12/03 1734)  Labs: Recent Labs    02/14/23 1642 02/14/23 1901 02/14/23 2152 02/15/23 0205 02/15/23 0653 02/15/23 1058 02/15/23 1859  HGB 13.3  --   --  11.6*  --   --   --   HCT 43.1  --   --  37.8*  --   --   --   PLT 228  --   --  193  --   --   --   LABPROT  --  14.6  --   --   --   --   --   INR  --  1.1  --   --   --   --   --   HEPARINUNFRC  --   --   --  0.15*  --  0.46 0.60  CREATININE 2.79*  --   --  2.79*  --   --   --   TROPONINIHS 609*  --    < > 535* 541* 458* 314*   < > = values in this interval not displayed.   Estimated Creatinine Clearance: 32.2 mL/min (A) (by C-G formula based on SCr of 2.79 mg/dL (H)).  Medical History: Past Medical History:  Diagnosis Date   Bladder cancer (HCC) 2013   CAD (coronary artery disease)    a. 2007 CABG x 4: LIMA->D1, VG->OM1, VG->D2->mLAD; b. 2018 NSTEMI/PCI: DES to VG->OM; c. 10/2020 NSTEMI/PCI: LM 90d, LAD 153m, D1 100, LCX 100ost/p, RCA sev diff dzs, VG->D2->mLAD 99/90 before D2 (4.0x15 Onyx Frontier DES), 90 before mLAD (4.0x15 Onyx Frontier DES), LIMA->D1 ok, VG->OM1 100; d. 10/2021 MV: no isch. mod basal to mid inf and antsept infarct.   Chronic heart failure with preserved ejection fraction (HFpEF) (HCC)    a.  10/2020 Echo: EF 50-55%, apical HK, GrII DD, mod dil LA; b. 11/2020 Echo: EF 60-65%, no rwma; c. 10/2021 Echo: EF 50-55%, inf HK, GrI DD, nl RV fxn, mildly dil LA, mild MR.   Chronic kidney disease    COPD (chronic obstructive pulmonary disease) (HCC)    Depression    Diabetes mellitus without complication (HCC)    Hx of CABG x 4    LIMA-D2, SVG-OM (occluded), SeqSVG-D1-LAD.   Labile hypertension    Myocardial infarction [facility])    Assessment:  77 y/o male presenting with shortness of breath and lower extremity edema. PMH significant for CAD s/p CABG, CHF, COPD, and CKD. Pharmacy has been consulted to initiate heparin infusion. Per chart review, patient is not on anticoagulation prior to admission. Baseline labs: hgb 13.3, plt 228, INR 1.1.   Goal of Therapy:  Heparin level 0.3-0.7 units/ml Monitor platelets by anticoagulation protocol: Yes  Labs:  12/3 0205 HL 0.15 - subtherapeutic on 1400 units/hr  12/3  1058 HL 0.46 - therapeutic x 1 on 1800 units/hr  12/3 1859 HL 0.60 - therapeutic x 2 on 1800 units/hr   Plan:  HL therapeutic x 2 Continue heparin infusion at 1800 units/hr  Recheck HL with AM labs Monitor CBC daily while on heparin  Current stop time 12/4 1829  Thank you for involving pharmacy in this patient's care.   Effie Shy, PharmD Pharmacy Resident  02/15/2023 7:40 PM

## 2023-02-15 NOTE — TOC Initial Note (Signed)
Transition of Care [facility]) - Initial/Assessment Note    Patient Details  Name: Tony Williams MRN: [id number] Date of Birth: December 30, 1945  [facility] CM/SW Contact:    Marquita Palms, LCSW Phone Number: 02/15/2023, 9:39 AM  Clinical Narrative:                  CSW met with patient and wife (Tony Williams) at bedside. Patient normally on 4L O2 at home. Pharmacy uses [facility] on [address]. Patient reports having RW walker, shower chair and no BSC needed. Patient not agreeable to SNF. HH patient and wife agreeable to. Patient has no other needs at this time. Patient being admitted.       Patient Goals and CMS Choice            Expected Discharge Plan and Services                                              Prior Living Arrangements/Services                       Activities of Daily Living      Permission Sought/Granted                  Emotional Assessment              Admission diagnosis:  Acute on chronic diastolic CHF (congestive heart failure) (HCC) [I50.33] Patient Active Problem List   Diagnosis Date Noted   Type II diabetes mellitus with renal manifestations (HCC) 02/14/2023   CAD (coronary artery disease) 02/14/2023   Myocardial injury 02/14/2023   Depression with anxiety 02/14/2023   Acute on chronic respiratory failure with hypoxia (HCC) 02/14/2023   Blurry vision, bilateral 02/14/2023   Stage 3b chronic kidney disease (CKD) (HCC) 08/10/2022   Acute gout due to renal impairment involving toe of left foot 05/31/2022   Hx of CABG 10/29/2021   Chronic respiratory failure with hypoxia (HCC) 10/28/2021   Unstable angina (HCC) 10/28/2021   Senile purpura (HCC) 08/06/2021   OSA (obstructive sleep apnea) 05/29/2021   Daytime sleepiness 02/12/2021   Idiopathic hypotension 12/16/2020   Hx of myocardial infarction 12/16/2020   Bradycardia 12/16/2020   Dizziness 12/16/2020   Symptomatic sinus bradycardia  12/08/2020   Dyspnea 11/15/2020   Acute on chronic diastolic CHF (congestive heart failure) (HCC) 11/10/2020   NSTEMI (non-ST elevated myocardial infarction) (HCC) 11/08/2020   Polyp of transverse colon    Morbid obesity (HCC) 07/21/2020   Chronic pain of both knees 07/21/2020   Diabetic polyneuropathy associated with type 2 diabetes mellitus (HCC) 07/21/2020   Chronic gout due to renal impairment without tophus 07/21/2020   Hypertension associated with diabetes (HCC) 07/21/2020   Hyperlipidemia associated with type 2 diabetes mellitus (HCC) 07/21/2020   GAD (generalized anxiety disorder) 07/21/2020   Psychophysiological insomnia 07/21/2020   Vitamin D insufficiency 04/18/2020   Chronic obstructive pulmonary disease (HCC) 04/18/2020   T2DM (type 2 diabetes mellitus) (HCC) 04/18/2020   Coronary artery disease involving coronary bypass graft of native heart without angina pectoris 04/18/2020   S/P primary angioplasty with coronary stent 04/18/2020   History of smoking at least 1 pack per day for at least 30 years 04/18/2020   PCP:  Erasmo Downer, MD Pharmacy:   [id number] [place], [place] - [phone]  [address] [facility] [address] [facility] [address] [place] [address] Phone: [phone] Fax: [phone]  [facility] [address] [place] [address] Phone: [phone] Fax: [phone]     Social Determinants of Health (SDOH) Social History: SDOH Screenings   Food Insecurity: No Food Insecurity (05/19/2021)  Housing: Low Risk  (05/19/2021)  Transportation Needs: No Transportation Needs (05/19/2021)  Alcohol Screen: Low Risk  (08/10/2022)  Depression (PHQ2-9): Low Risk  (08/10/2022)  Financial Resource Strain: Low Risk  (05/19/2021)  Physical Activity: Inactive (05/19/2021)  Social Connections: Unknown (05/19/2021)  Stress: No Stress Concern Present (05/19/2021)  Recent Concern: Stress - Stress Concern Present  (02/26/2021)  Tobacco Use: Medium Risk (02/05/2023)   SDOH Interventions:     Readmission Risk Interventions    02/15/2023    9:37 AM 12/11/2020   11:06 AM  Readmission Risk Prevention Plan  Transportation Screening Complete Complete  PCP or Specialist Appt within 5-7 Days  Complete  PCP or Specialist Appt within 3-5 Days Complete   Medication Review (RN CM)  Complete  HRI or Home Care Consult Complete   Social Work Consult for Recovery Care Planning/Counseling Complete   Palliative Care Screening Not Applicable   Medication Review Oceanographer) Not Complete   Med Review Comments patient will discuss medication upon discharge

## 2023-02-15 NOTE — ED Notes (Signed)
Wife placed lidocaine patch from home on pt's L knee.

## 2023-02-15 NOTE — Progress Notes (Signed)
PHARMACY - ANTICOAGULATION CONSULT NOTE  Pharmacy Consult for heparin Indication: chest pain/ACS  Allergies  Allergen Reactions   Dilaudid [Hydromorphone] Hives    Rash and itch   Butorphanol Itching   Morphine Other (See Comments)    Redness around IV site   Pedi-Pre Tape Spray [Wound Dressing Adhesive] Itching   Statins Other (See Comments)    "Destroyed my muscles"   Zocor [Simvastatin] Other (See Comments)    "Destroyed my muscles"   Patient Measurements: Height: 5\' 10"  (177.8 cm) Weight: (!) 142.9 kg (315 lb) IBW/kg (Calculated) : 73 Heparin Dosing Weight: 106.7 kg  Vital Signs: Temp: 97.9 F (36.6 C) (12/03 1120) Temp Source: Oral (12/03 1120) BP: 130/61 (12/03 1030) Pulse Rate: 68 (12/03 1030)  Labs: Recent Labs    02/14/23 1642 02/14/23 1901 02/14/23 2152 02/15/23 0205 02/15/23 0653 02/15/23 1058  HGB 13.3  --   --  11.6*  --   --   HCT 43.1  --   --  37.8*  --   --   PLT 228  --   --  193  --   --   LABPROT  --  14.6  --   --   --   --   INR  --  1.1  --   --   --   --   HEPARINUNFRC  --   --   --  0.15*  --  0.46  CREATININE 2.79*  --   --  2.79*  --   --   TROPONINIHS 609*  --  550* 535* 541*  --    Estimated Creatinine Clearance: 32.2 mL/min (A) (by C-G formula based on SCr of 2.79 mg/dL (H)).  Medical History: Past Medical History:  Diagnosis Date   Bladder cancer (HCC) 2013   CAD (coronary artery disease)    a. 2007 CABG x 4: LIMA->D1, VG->OM1, VG->D2->mLAD; b. 2018 NSTEMI/PCI: DES to VG->OM; c. 10/2020 NSTEMI/PCI: LM 90d, LAD 131m, D1 100, LCX 100ost/p, RCA sev diff dzs, VG->D2->mLAD 99/90 before D2 (4.0x15 Onyx Frontier DES), 90 before mLAD (4.0x15 Onyx Frontier DES), LIMA->D1 ok, VG->OM1 100.   Chronic heart failure with preserved ejection fraction (HFpEF) (HCC)    a. 10/2020 Echo: EF 50-55%, apical HK, GrII DD, mod dil LA; b. 11/2020 Echo: EF 60-65%, no rwma.   Chronic kidney disease    COPD (chronic obstructive pulmonary disease) (HCC)     Depression    Diabetes mellitus without complication (HCC)    Hx of CABG x 4    LIMA-D2, SVG-OM (occluded), SeqSVG-D1-LAD.   Labile hypertension    Myocardial infarction [facility])    Assessment:  77 y/o male presenting with shortness of breath and lower extremity edema. PMH significant for CAD s/p CABG, CHF, COPD, and CKD. Pharmacy has been consulted to initiate heparin infusion. Per chart review, patient is not on anticoagulation prior to admission. Baseline labs: hgb 13.3, plt 228, INR 1.1.   Goal of Therapy:  Heparin level 0.3-0.7 units/ml Monitor platelets by anticoagulation protocol: Yes  Labs:  12/3 0205 HL 0.15 - subtherapeutic on 1400 units/hr  12/3 1058 HL 0.46 - therapeutic x 1 on 1800 units/hr    Plan:  HL therapeutic x 1  Continue heparin infusion at 1800 units/hr  Recheck HL in 8 hours to confirm rate  Monitor CBC daily while on heparin   Thank you for involving pharmacy in this patient's care.   Littie Deeds, PharmD Pharmacy Resident  02/15/2023 11:34 AM

## 2023-02-15 NOTE — ED Notes (Signed)
Echo at bedside

## 2023-02-15 NOTE — Progress Notes (Signed)
*  PRELIMINARY RESULTS* Echocardiogram 2D Echocardiogram has been performed.  Carolyne Fiscal 02/15/2023, 3:41 PM

## 2023-02-15 NOTE — Progress Notes (Signed)
PHARMACY - ANTICOAGULATION CONSULT NOTE  Pharmacy Consult for heparin Indication: chest pain/ACS  Allergies  Allergen Reactions   Dilaudid [Hydromorphone] Hives    Rash and itch   Butorphanol Itching   Morphine Other (See Comments)    Redness around IV site   Pedi-Pre Tape Spray [Wound Dressing Adhesive] Itching   Statins Other (See Comments)    "Destroyed my muscles"   Zocor [Simvastatin] Other (See Comments)    "Destroyed my muscles"   Patient Measurements: Height: 5\' 10"  (177.8 cm) Weight: (!) 142.9 kg (315 lb) IBW/kg (Calculated) : 73 Heparin Dosing Weight: 106.7 kg  Vital Signs: Temp: 97.9 F (36.6 C) (12/03 0204) Temp Source: Oral (12/03 0204) BP: 130/105 (12/03 0230) Pulse Rate: 67 (12/03 0230)  Labs: Recent Labs    02/14/23 1642 02/14/23 1901 02/14/23 2152 02/15/23 0205  HGB 13.3  --   --  11.6*  HCT 43.1  --   --  37.8*  PLT 228  --   --  193  LABPROT  --  14.6  --   --   INR  --  1.1  --   --   HEPARINUNFRC  --   --   --  0.15*  CREATININE 2.79*  --   --  2.79*  TROPONINIHS 609*  --  550* 535*   Estimated Creatinine Clearance: 32.2 mL/min (A) (by C-G formula based on SCr of 2.79 mg/dL (H)).  Medical History: Past Medical History:  Diagnosis Date   Bladder cancer (HCC) 2013   CAD (coronary artery disease)    a. 2007 CABG x 4: LIMA->D1, VG->OM1, VG->D2->mLAD; b. 2018 NSTEMI/PCI: DES to VG->OM; c. 10/2020 NSTEMI/PCI: LM 90d, LAD 160m, D1 100, LCX 100ost/p, RCA sev diff dzs, VG->D2->mLAD 99/90 before D2 (4.0x15 [employer] DES), 90 before mLAD (4.0x15 [employer] DES), LIMA->D1 ok, VG->OM1 100.   Chronic heart failure with preserved ejection fraction (HFpEF) (HCC)    a. 10/2020 Echo: EF 50-55%, apical HK, GrII DD, mod dil LA; b. 11/2020 Echo: EF 60-65%, no rwma.   Chronic kidney disease    COPD (chronic obstructive pulmonary disease) (HCC)    Depression    Diabetes mellitus without complication (HCC)    Hx of CABG x 4    LIMA-D2, SVG-OM (occluded),  SeqSVG-D1-LAD.   Labile hypertension    Myocardial infarction [facility])    Assessment:  77 y/o male presenting with shortness of breath and lower extremity edema. PMH significant for CAD s/p CABG, CHF, COPD, and CKD. Pharmacy has been consulted to initiate heparin infusion. Per chart review, patient is not on anticoagulation prior to admission.  Baseline labs: hgb 13.3, plt 228, INR ordered  Goal of Therapy:  Heparin level 0.3-0.7 units/ml Monitor platelets by anticoagulation protocol: Yes   Plan:  12/3:  HL @ 0205 = 0.15, SUBtherapeutic  - Will order heparin 3200 units IV X 1 bolus and increase drip rate to 1800 units/hr - Will recheck HL 8 hrs after rate change   Thank you for involving pharmacy in this patient's care.   Justun Anaya D Clinical Pharmacist 02/15/2023 3:15 AM

## 2023-02-15 NOTE — Progress Notes (Signed)
PROGRESS NOTE    Tony Williams  ZOX:[id number] DOB: [birth year] DOA: 02/14/2023 PCP: Erasmo Downer, MD    Brief Narrative:  77 y.o. male with medical history significant of dCHF, HLD, HTN, DM, COPD on 3L oxygen, CAD (s/p of CABG and stent), gout, CKD-3b, depression with anxiety, morbid obesity, OSA, former smoker, bilateral cancer, who presents with SOB.   Patient states that he has shortness of breath in the past several weeks which has been progressively worsening.  Patient is dry cough, no fever or chills.  He had mild substernal chest pain earlier, which has resolved. Patient also has worsening bilateral lower leg edema and 15 pounds of weight gain recently.  Patient has nausea, no vomiting, diarrhea or abdominal pain.  No symptoms of UTI.  Patient states he has intermittent bilateral blurry vision for more than 3 weeks.  He reports mild numbness in both feet, and generalized weakness.  No unilateral weakness in extremities.  No facial droop or slurred speech.   Patient is normally using 3 L oxygen at baseline, but found to have acute respiratory distress, with oxygen desaturation to 82% and difficulty speaking in full sentence, currently requiring 6 L oxygen with 93% of saturation.   Assessment & Plan:   Principal Problem:   Acute on chronic diastolic CHF (congestive heart failure) (HCC) Active Problems:   Acute on chronic respiratory failure with hypoxia (HCC)   CAD (coronary artery disease)   Myocardial injury   Chronic obstructive pulmonary disease (HCC)   Blurry vision, bilateral   Hypertension associated with diabetes (HCC)   Hyperlipidemia associated with type 2 diabetes mellitus (HCC)   Type II diabetes mellitus with renal manifestations (HCC)   Stage 3b chronic kidney disease (CKD) (HCC)   Chronic gout due to renal impairment without tophus   Depression with anxiety   OSA (obstructive sleep apnea)   Morbid obesity (HCC)  Acute on chronic respiratory failure  with hypoxia due to acute on chronic diastolic CHF (congestive heart failure) (HCC): 2D echo on 10/29/2021 showed EF 50-55% with grade 1 diastolic dysfunction.  Patient has 3+ leg edema, 15 pounds weight gain, elevated BNP 627, clinically consistent with CHF exacerbation. Plan: Cardiac telemetry IV Lasix 40 twice daily for now Goal net negative 1.5-2 L daily Check 2D echocardiogram Strict ins and outs, daily weights, fluid restrict Cardiology consulted [facility] Monitor renal function carefully   Hx of CAD (coronary artery disease) and myocardial injury vs. NSTEMI: trop 609. Pt had mild chest pain earlier, which has resolved.  Patient is s/p of CABG and stent placement. - IV heparin is started by EDP Plan: Heparin GTT x 48 hours PTA aspirin and Plavix Statin 2D echo as above Cardiology consult    Chronic obstructive pulmonary disease No evidence of acute exacerbation Bronchodilators and PR Mucinex   Blurry vision, bilateral: Etiology is not clear. It has been going on for more than 3 weeks intermittently.  No unilateral weakness in extremities.  No facial droop or slurred speech.  I offered to do MRI, but the patient has claustrophobia, need open MRI.  Patient states that he wants to see eye doctor first before doing MRI. -Will get CT of head --> negative for acute intracranial abnormalities. Plan: -Patient is on aspirin and statin -Fall precaution   Hypertension associated with diabetes (HCC) -IV hydralazine as needed -Patient is on IV Lasix -Amlodipine, clonidine, oral hydralazine, Imdur,   Hyperlipidemia associated with type 2 diabetes mellitus (HCC) -Tricor and lovastatin   Type II  diabetes mellitus with renal manifestations [facility]): Recent A1c 6.3, well-controlled.  Patient is taking glipizide at home. -Sliding scale insulin   Stage 3b chronic kidney disease (CKD) (HCC): Renal function is slightly worse than baseline.  Recent baseline creatinine 2.12 on 08/31/2022.  His  creatinine is at 2.79, BUN 61, GFR 23 today.  May be due to cardiorenal syndrome. -Avoid using renal toxic medications. -Carefully monitoring renal function while patient is receiving IV Lasix.     Chronic gout due to renal impairment without tophus -Allopurinol and colchicine   Depression with anxiety -Continue home medications   OSA (obstructive sleep apnea) -CPAP   Morbid obesity (HCC): Body weight 142.9 kg, BMI 45.2 -Encourage losing weight -Exercise and healthy diet    DVT prophylaxis: IV heparin Code Status: Full code Family Communication: Family member at bedside 12/3 Disposition Plan: Status is: Inpatient Remains inpatient appropriate because: Decompensated heart failure on IV diuretic   Level of care: Progressive  Consultants:  Cardiology-[facility]  Procedures:  None  Antimicrobials: None   Subjective: Seen and examined.  Resting in bed.  Reports interval improvement in symptoms since administration of IV Lasix.  Objective: Vitals:   02/15/23 1300 02/15/23 1315 02/15/23 1330 02/15/23 1400  BP: (!) 105/56  (!) 112/49 (!) 132/55  Pulse:      Resp: 19 18 15    Temp:      TempSrc:      SpO2:      Weight:      Height:        Intake/Output Summary (Last 24 hours) at 02/15/2023 1520 Last data filed at 02/15/2023 0654 Gross per 24 hour  Intake --  Output 800 ml  Net -800 ml   Filed Weights   02/14/23 1639  Weight: (!) 142.9 kg    Examination:  General exam: Appears calm and comfortable  Respiratory system: Bibasilar crackles.  Normal work of breathing.  2 L Cardiovascular system: S1-S2, RRR, no murmurs, 3+ pitting edema BLE Gastrointestinal system: Obese, soft, NT/ND, normal bowel sounds Central nervous system: Alert and oriented. No focal neurological deficits. Extremities: Symmetric 5 x 5 power. Skin: No rashes, lesions or ulcers Psychiatry: Judgement and insight appear normal. Mood & affect appropriate.     Data Reviewed: I have personally  reviewed following labs and imaging studies  CBC: Recent Labs  Lab 02/14/23 1642 02/15/23 0205  WBC 9.0 7.5  HGB 13.3 11.6*  HCT 43.1 37.8*  MCV 92.3 92.2  PLT 228 193   Basic Metabolic Panel: Recent Labs  Lab 02/14/23 1642 02/15/23 0205  NA 140 137  K 4.6 4.4  CL 103 103  CO2 27 25  GLUCOSE 82 111*  BUN 61* 61*  CREATININE 2.79* 2.79*  CALCIUM 9.2 8.2*  MG  --  2.2   GFR: Estimated Creatinine Clearance: 32.2 mL/min (A) (by C-G formula based on SCr of 2.79 mg/dL (H)). Liver Function Tests: No results for input(s): "AST", "ALT", "ALKPHOS", "BILITOT", "PROT", "ALBUMIN" in the last 168 hours. No results for input(s): "LIPASE", "AMYLASE" in the last 168 hours. No results for input(s): "AMMONIA" in the last 168 hours. Coagulation Profile: Recent Labs  Lab 02/14/23 1901  INR 1.1   Cardiac Enzymes: No results for input(s): "CKTOTAL", "CKMB", "CKMBINDEX", "TROPONINI" in the last 168 hours. BNP (last 3 results) No results for input(s): "PROBNP" in the last 8760 hours. HbA1C: Recent Labs    02/15/23 0205  HGBA1C 6.8*   CBG: Recent Labs  Lab 02/14/23 2149 02/15/23 0726 02/15/23 1150  GLUCAP 117* 84 133*   Lipid Profile: Recent Labs    02/15/23 0205  CHOL 131  HDL 31*  LDLCALC 75  TRIG 846  CHOLHDL 4.2   Thyroid Function Tests: No results for input(s): "TSH", "T4TOTAL", "FREET4", "T3FREE", "THYROIDAB" in the last 72 hours. Anemia Panel: No results for input(s): "VITAMINB12", "FOLATE", "FERRITIN", "TIBC", "IRON", "RETICCTPCT" in the last 72 hours. Sepsis Labs: No results for input(s): "PROCALCITON", "LATICACIDVEN" in the last 168 hours.  Recent Results (from the past 240 hour(s))  SARS Coronavirus 2 by RT PCR (hospital order, performed in [facility] hospital lab) *cepheid single result test* Anterior Nasal Swab     Status: None   Collection Time: 02/14/23  7:01 PM   Specimen: Anterior Nasal Swab  Result Value Ref Range Status   SARS Coronavirus 2 by  RT PCR NEGATIVE NEGATIVE Final    Comment: (NOTE) SARS-CoV-2 target nucleic acids are NOT DETECTED.  The SARS-CoV-2 RNA is generally detectable in upper and lower respiratory specimens during the acute phase of infection. The lowest concentration of SARS-CoV-2 viral copies this assay can detect is 250 copies / mL. A negative result does not preclude SARS-CoV-2 infection and should not be used as the sole basis for treatment or other patient management decisions.  A negative result may occur with improper specimen collection / handling, submission of specimen other than nasopharyngeal swab, presence of viral mutation(s) within the areas targeted by this assay, and inadequate number of viral copies (<250 copies / mL). A negative result must be combined with clinical observations, patient history, and epidemiological information.  Fact Sheet for Patients:   RoadLapTop.co.za  Fact Sheet for Healthcare Providers: http://kim-miller.com/  This test is not yet approved or  cleared by the [place] FDA and has been authorized for detection and/or diagnosis of SARS-CoV-2 by FDA under an Emergency Use Authorization (EUA).  This EUA will remain in effect (meaning this test can be used) for the duration of the COVID-19 declaration under Section 564(b)(1) of the Act, 21 U.S.C. section 360bbb-3(b)(1), unless the authorization is terminated or revoked sooner.  Performed at [facility], [address], [place], [place] [address]          Radiology Studies: [facility] Chest [place] 1 View  Result Date: 02/14/2023 CLINICAL DATA:  Shortness of breath EXAM: PORTABLE CHEST 1 VIEW COMPARISON:  08/04/2022, 12/28/2021, 01/28/2021 FINDINGS: Cardio post sternotomy changes. Cardiomegaly with vascular congestion and probable mild interstitial edema. Chronic pleural and parenchymal scarring on the right. No visible pneumothorax. IMPRESSION: Cardiomegaly  with vascular congestion and probable mild interstitial edema. Chronic pleural and parenchymal scarring in the right thorax Electronically Signed   By: Jasmine Pang M.D.   On: 02/14/2023 20:38   CT HEAD WO CONTRAST ( )  Result Date: 02/14/2023 CLINICAL DATA:  Neuro deficit with acute stroke suspected EXAM: CT HEAD WITHOUT CONTRAST TECHNIQUE: Contiguous axial images were obtained from the base of the skull through the vertex without intravenous contrast. RADIATION DOSE REDUCTION: This exam was performed according to the departmental dose-optimization program which includes automated exposure control, adjustment of the mA and/or kV according to patient size and/or use of iterative reconstruction technique. COMPARISON:  11/10/2021 FINDINGS: Brain: No evidence of acute infarction, hemorrhage, hydrocephalus, extra-axial collection or mass lesion/mass effect. Chronic small vessel ischemia in the cerebral white matter that is unchanged. Small area of encephalomalacia in the anterior left frontal lobe, likely post ischemic, unchanged. Vascular: No hyperdense vessel or unexpected calcification. Skull: Normal. Negative for fracture or  focal lesion. Sinuses/Orbits: No acute finding. IMPRESSION: No acute finding.  Stable chronic ischemic injury Electronically Signed   By: Tiburcio Pea M.D.   On: 02/14/2023 19:51        Scheduled Meds:  allopurinol  300 mg Oral Daily   amLODipine  10 mg Oral Daily   aspirin EC  81 mg Oral Daily   carvedilol  3.125 mg Oral BID WC   cholecalciferol  4,000 Units Oral Daily   cloNIDine  0.1 mg Oral Daily   clopidogrel  75 mg Oral Daily   colchicine  0.6 mg Oral BID   escitalopram  20 mg Oral Daily   fenofibrate  160 mg Oral Daily   furosemide  40 mg Intravenous Q12H   gabapentin  300 mg Oral TID   hydrALAZINE  100 mg Oral Q8H   insulin aspart  0-5 Units Subcutaneous QHS   insulin aspart  0-9 Units Subcutaneous TID WC   ipratropium-albuterol  3 mL Nebulization Q6H    isosorbide mononitrate  30 mg Oral Daily   loratadine  10 mg Oral Daily   pregabalin  100 mg Oral BID   traZODone  150 mg Oral QHS   Continuous Infusions:  heparin 1,800 Units/hr (02/15/23 0720)     LOS: 1 day    Tresa Moore, MD [facility]   If 7PM-7AM, please contact night-coverage  02/15/2023, 3:20 PM

## 2023-02-15 NOTE — Consult Note (Signed)
Cardiology Consult    Patient ID: Tony Williams MRN: [id number], DOB/AGE: 1946/01/31   Admit date: 02/14/2023 Date of Consult: 02/15/2023  Primary Physician: Tony Downer, MD Primary Cardiologist: Debbe Odea, MD Requesting Provider: Kathie Rhodes. Tony Oppenheim, MD  Patient Profile    Tony Williams is a 77 y.o. male with a history of CAD status post four-vessel bypass in 2007 with subsequent interventions, chronic HFpEF, hypertension, hyperlipidemia, statin intolerance, obesity, diabetes, remote tobacco abuse, COPD, sleep apnea (not using CPAP), and stage III chronic kidney disease, who is being seen today for the evaluation of dyspnea and heart failure at the request of Dr. Georgeann Williams.  Past Medical History  Subjective  Past Medical History:  Diagnosis Date   Bladder cancer (HCC) 2013   CAD (coronary artery disease)    a. 2007 CABG x 4: LIMA->D1, VG->OM1, VG->D2->mLAD; b. 2018 NSTEMI/PCI: DES to VG->OM; c. 10/2020 NSTEMI/PCI: LM 90d, LAD 164m, D1 100, LCX 100ost/p, RCA sev diff dzs, VG->D2->mLAD 99/90 before D2 (4.0x15 Onyx Frontier DES), 90 before mLAD (4.0x15 Onyx Frontier DES), LIMA->D1 ok, VG->OM1 100; d. 10/2021 MV: no isch. mod basal to mid inf and antsept infarct.   Chronic heart failure with preserved ejection fraction (HFpEF) (HCC)    a. 10/2020 Echo: EF 50-55%, apical HK, GrII DD, mod dil LA; b. 11/2020 Echo: EF 60-65%, no rwma; c. 10/2021 Echo: EF 50-55%, inf HK, GrI DD, nl RV fxn, mildly dil LA, mild MR.   Chronic kidney disease    COPD (chronic obstructive pulmonary disease) (HCC)    Depression    Diabetes mellitus without complication (HCC)    Hx of CABG x 4    LIMA-D2, SVG-OM (occluded), SeqSVG-D1-LAD.   Labile hypertension    Myocardial infarction [facility])     Past Surgical History:  Procedure Laterality Date   CHOLECYSTECTOMY, LAPAROSCOPIC  12/26/1995   COLONOSCOPY WITH PROPOFOL N/A 09/23/2020   Procedure: COLONOSCOPY WITH PROPOFOL;  Surgeon: Tony Minium, MD;   Location[facility] ENDOSCOPY;  Service: Endoscopy;  Laterality: N/A;   CORONARY ARTERY BYPASS GRAFT     LIMA-D2, SVG-OM, SeqSVG-D1-LAD   CORONARY STENT INTERVENTION N/A 11/12/2020   Procedure: CORONARY STENT INTERVENTION;  Surgeon: Tony Ouch, MD;  Location: [facility] INVASIVE CV LAB;  Service: Cardiovascular;  Laterality: N/A;   EYE SURGERY     IABP INSERTION N/A 11/11/2020   Procedure: IABP Insertion;  Surgeon: Tony Kendall, MD;  Location: [facility] INVASIVE CV LAB;  Service: Cardiovascular;  Laterality: N/A;   IR STENT PLACEMENT ANTE CAROTID INC ANGIO     KNEE SURGERY     RIGHT/LEFT HEART CATH AND CORONARY/GRAFT ANGIOGRAPHY N/A 11/11/2020   Procedure: RIGHT/LEFT HEART CATH AND CORONARY/GRAFT ANGIOGRAPHY;  Surgeon: Tony Kendall, MD;  Location: [facility] INVASIVE CV LAB;  Service: Cardiovascular;  Severe native CAD: 90% dLMCA-100% mLAD/100% ost LCx CTO w/ Severe diffuse RCA disease. Widely patent LIMA-D1. Patent sequential SVG-D2-LAD with 99% mid graft & 90% LIMA-D2 @ anastomosis. previously stented SVG-OM - CTO. Mildly   TRANSURETHRAL RESECTION OF PROSTATE  1994   URINARY SPHINCTER IMPLANT  1995     Allergies  Allergies  Allergen Reactions   Dilaudid [Hydromorphone] Hives    Rash and itch   Butorphanol Itching   Morphine Other (See Comments)    Redness around IV site   Pedi-Pre Tape Spray [Wound Dressing Adhesive] Itching   Statins Other (See Comments)    "Destroyed my muscles"   Zocor [Simvastatin] Other (See Comments)    "Destroyed my muscles"  History of Present Illness     77 year old male with the above complex past medical history including CAD status post four-vessel bypass in 2007 with subsequent interventions, hypertension, hyperlipidemia (statin intolerant), chronic HFpEF, obesity, diabetes, remote tobacco abuse, COPD, sleep apnea (not using CPAP), and stage III chronic kidney disease.  As noted, he underwent four-vessel bypass in 2007 in [place].  In 2018, he suffered a  non-STEMI and required stenting of the vein graft to the obtuse marginal.  He subsequently moved to the [place] area and establish care with our practice.  In March 2022, he was evaluated for chest discomfort underwent stress testing which showed basal inferior, basal inferolateral, mid inferior, and mid inferolateral defect consistent with infarct and moderate peri-infarct ischemia.  EF was 45%.  This was followed by an echocardiogram which showed an EF of 60 to 65% without regional wall motion abnormalities.  He was medically managed.  In August 2022, he was admitted with angina and ruled in for non-STEMI.  Echo showed an EF of 50 to 55% with apical hypokinesis and grade 2 diastolic dysfunction.  He was volume overloaded and required diuresis with subsequent development of mild acute kidney injury.  He subsequently underwent diagnostic catheterization revealing an occlusion of the previously stented vein graft to the obtuse marginal as well as severe disease in sequential vein graft to the D2 and mid LAD with stenoses prior to insertion of both vessels.  He required placement of intra-aortic balloon pump was transferred to [facility] for high risk PCI, which was performed the following day with 2 drug-eluting stents placed in the sequential vein graft.  During admission, he was noted to have intermittent bradycardia in 2-1 AV block.  Beta-blocker was initially discontinued the resumed at a low dose prior to discharge.  In the setting of elevated blood pressures in the outpatient setting, patient self titrated his beta-blocker at home which resulted in marked sinus bradycardia with rates in the 30s and 40s and prompted ED evaluation.  Beta-blocker was discontinued without any further significant bradycardia.  He was seen electrophysiology and he was not felt to require pacing.  [name] was readmitted in 10/2021 w/ c/p and demand ischemia.  Stress testing was moderate risk in the setting of a fixed basal to  mid inferior and mid anteroseptal defect.  Echo showed EF of 55% with grade 1 diastolic dysfunction.  He was medically managed.    Over the past 3 wks, he has been having progressive bilateral lower ext edema and in that setting, has gained about 20 lbs in the past month, 15 of which in the past week.  He has also been having progressive dyspnea at rest and orthopnea, prompting him to sleep in his recliner over the past week.  He has had occasional, fleeting c/p, different from his prior angina, which he calls "twinges."  He has had some unsteadiness and dizziness, which he attributes to severe L knee pain and swelling, for which he was pending an injection.  Due to progressive dyspnea, orthopnea, and swelling, he presented to the ED on 12/2.  Here, he was afebrile and normotensive.  ECG showed RSR, 68, septal infart, inflat ST depression, IVCD - no acute changes.  Lab work notable for elevated BUN/creatinine above baseline at 61/2.79.  BNP of 627.9, troponin of 609  55  535  541  458.  He received intravenous Lasix with good output thus far.  Inpatient Medications  Subjective    allopurinol  300 mg Oral  Daily   amLODipine  10 mg Oral Daily   aspirin EC  81 mg Oral Daily   carvedilol  3.125 mg Oral BID WC   cholecalciferol  4,000 Units Oral Daily   cloNIDine  0.1 mg Oral Daily   clopidogrel  75 mg Oral Daily   colchicine  0.6 mg Oral BID   escitalopram  20 mg Oral Daily   fenofibrate  160 mg Oral Daily   furosemide  40 mg Intravenous Q12H   gabapentin  300 mg Oral TID   hydrALAZINE  100 mg Oral Q8H   insulin aspart  0-5 Units Subcutaneous QHS   insulin aspart  0-9 Units Subcutaneous TID WC   ipratropium-albuterol  3 mL Nebulization Q6H   isosorbide mononitrate  30 mg Oral Daily   loratadine  10 mg Oral Daily   pregabalin  100 mg Oral BID   traZODone  150 mg Oral QHS    Family History    Family History  Problem Relation Age of Onset   Heart Problems Mother    Heart Problems Father     He indicated that his mother is deceased. He indicated that his father is deceased.   Social History    Social History   Socioeconomic History   Marital status: Married    Spouse name: Not on file   Number of children: 2   Years of education: Not on file   Highest education level: High school graduate  Occupational History   Occupation: retired  Tobacco Use   Smoking status: Former    Current packs/day: 0.00    Average packs/day: 1 pack/day for 30.0 years (30.0 ttl pk-yrs)    Types: Cigarettes    Start date: 10/22/1973    Quit date: 10/23/2003    Years since quitting: 19.3   Smokeless tobacco: Never  Vaping Use   Vaping status: Never Used  Substance and Sexual Activity   Alcohol use: Never   Drug use: Never   Sexual activity: Not Currently  Other Topics Concern   Not on file  Social History Narrative   Not on file   Social Determinants of Health   Financial Resource Strain: Low Risk  (05/19/2021)   Overall Financial Resource Strain (CARDIA)    Difficulty of Paying Living Expenses: Not hard at all  Food Insecurity: No Food Insecurity (05/19/2021)   Hunger Vital Sign    Worried About Running Out of Food in the Last Year: Never true    Ran Out of Food in the Last Year: Never true  Transportation Needs: No Transportation Needs (05/19/2021)   PRAPARE - Administrator, Civil Service (Medical): No    Lack of Transportation (Non-Medical): No  Physical Activity: Inactive (05/19/2021)   Exercise Vital Sign    Days of Exercise per Week: 0 days    Minutes of Exercise per Session: 0 min  Stress: No Stress Concern Present (05/19/2021)   [employer] of Occupational Health - Occupational Stress Questionnaire    Feeling of Stress : Only a little  Recent Concern: Stress - Stress Concern Present (02/26/2021)   [employer] of Occupational Health - Occupational Stress Questionnaire    Feeling of Stress : To some extent  Social Connections: Unknown (05/19/2021)    Social Connection and Isolation Panel [NHANES]    Frequency of Communication with Friends and Family: Not on file    Frequency of Social Gatherings with Friends and Family: Twice a week    Attends Religious Services:  More than 4 times per year    Active Member of Clubs or Organizations: No    Attends Banker Meetings: Never    Marital Status: Married  Catering manager Violence: Not At Risk (05/19/2021)   Humiliation, Afraid, Rape, and Kick questionnaire    Fear of Current or Ex-Partner: No    Emotionally Abused: No    Physically Abused: No    Sexually Abused: No     Review of Systems    General:  No chills, fever, night sweats or weight changes.  Cardiovascular:  Occas "twing" of chest pain, +++ dyspnea on exertion, +++ LE edema, +++ orthopnea, no palpitations, +++ paroxysmal nocturnal dyspnea. Dermatological: No rash, lesions/masses Respiratory: No cough, +++ dyspnea Urologic: No hematuria, dysuria Abdominal:   No nausea, vomiting, diarrhea, bright red blood per rectum, melena, or hematemesis Neurologic:  No visual changes, wkns, changes in mental status. MSK: Bilateral left greater than right knee pain. All other systems reviewed and are otherwise negative except as noted above.    Objective  Physical Exam    Blood pressure (!) 115/47, pulse 64, temperature 97.9 F (36.6 C), temperature source Oral, resp. rate 16, height 5\' 10"  (1.778 m), weight (!) 142.9 kg, SpO2 94%.  General: Pleasant, NAD Psych: Normal affect. Neuro: Alert and oriented X 3. Moves all extremities spontaneously. HEENT: Normal  Neck: Supple, obese, difficult to gauge JVP.  No bruits or masses. Lungs:  Resp regular and unlabored, diminished breath sounds bilaterally. Heart: RRR no s3, s4, or murmurs. Abdomen: Obese, semifirm, nontender, BS + x 4.  Extremities: No clubbing, cyanosis.  2+ bilateral lower extremity edema. DP/PT2+, Radials 2+ and equal bilaterally.  Labs    Cardiac Enzymes Recent  Labs  Lab 02/14/23 1642 02/14/23 2152 02/15/23 0205 02/15/23 0653 02/15/23 1058  TROPONINIHS 609* 550* 535* 541* 458*     BNP    Component Value Date/Time   BNP 627.9 (H) 02/14/2023 1642    ProBNP    Component Value Date/Time   PROBNP 715 (H) 09/17/2021 1547    Lab Results  Component Value Date   WBC 7.5 02/15/2023   HGB 11.6 (L) 02/15/2023   HCT 37.8 (L) 02/15/2023   MCV 92.2 02/15/2023   PLT 193 02/15/2023    Recent Labs  Lab 02/15/23 0205  NA 137  K 4.4  CL 103  CO2 25  BUN 61*  CREATININE 2.79*  CALCIUM 8.2*  GLUCOSE 111*   Lab Results  Component Value Date   CHOL 131 02/15/2023   HDL 31 (L) 02/15/2023   LDLCALC 75 02/15/2023   TRIG 123 02/15/2023   Lab Results  Component Value Date   DDIMER 0.91 (H) 01/26/2021      Radiology Studies    DG Chest Port 1 View  Result Date: 02/14/2023 CLINICAL DATA:  Shortness of breath EXAM: PORTABLE CHEST 1 VIEW COMPARISON:  08/04/2022, 12/28/2021, 01/28/2021 FINDINGS: Cardio post sternotomy changes. Cardiomegaly with vascular congestion and probable mild interstitial edema. Chronic pleural and parenchymal scarring on the right. No visible pneumothorax. IMPRESSION: Cardiomegaly with vascular congestion and probable mild interstitial edema. Chronic pleural and parenchymal scarring in the right thorax Electronically Signed   By: Jasmine Pang M.D.   On: 02/14/2023 20:38   CT HEAD WO CONTRAST ( )  Result Date: 02/14/2023 CLINICAL DATA:  Neuro deficit with acute stroke suspected EXAM: CT HEAD WITHOUT CONTRAST TECHNIQUE: Contiguous axial images were obtained from the base of the skull through the vertex without intravenous contrast. RADIATION  DOSE REDUCTION: This exam was performed according to the departmental dose-optimization program which includes automated exposure control, adjustment of the mA and/or kV according to patient size and/or use of iterative reconstruction technique. COMPARISON:  11/10/2021 FINDINGS:  Brain: No evidence of acute infarction, hemorrhage, hydrocephalus, extra-axial collection or mass lesion/mass effect. Chronic small vessel ischemia in the cerebral white matter that is unchanged. Small area of encephalomalacia in the anterior left frontal lobe, likely post ischemic, unchanged. Vascular: No hyperdense vessel or unexpected calcification. Skull: Normal. Negative for fracture or focal lesion. Sinuses/Orbits: No acute finding. IMPRESSION: No acute finding.  Stable chronic ischemic injury Electronically Signed   By: Tiburcio Pea M.D.   On: 02/14/2023 19:51   DG Knee Complete 4 Views Left  Result Date: 02/05/2023 CLINICAL DATA:  Knee pain.  History of arthritis. EXAM: LEFT KNEE - COMPLETE 4+ VIEW COMPARISON:  None Available. FINDINGS: No joint effusion. No signs of acute fracture or dislocation. Patellofemoral and medial compartment joint space narrowing with marginal spur formation and sharpening of the tibial spines. Atherosclerotic calcifications course posterior to the knee. IMPRESSION: 1. No acute findings. 2. Patellofemoral and medial compartment osteoarthritis. Electronically Signed   By: Signa Kell M.D.   On: 02/05/2023 07:53      ECG & Cardiac Imaging    RSR, 68, septal infart, inflat ST depression, IVCD - personally reviewed.  Assessment & Plan    1.  Acute on chronic heart failure with preserved ejection fraction: 3-week history of progressive dyspnea, orthopnea, and edema.  Presented to the emergency department on December 2 due to progressive symptoms.  BNP 627.9 and chest x-ray with interstitial edema.  He is volume overloaded on examination.  Creatinine elevated above baseline currently.  Continue Lasix 40 mg IV twice daily.  Heart rate and blood pressure stable.  2.  Coronary artery disease/demand ischemia: Troponin elevated in the 500-600 range with a flat trend.  He has had somewhat atypical chest discomfort with fleeting pain intermittently over the past few weeks.   Progressive dyspnea as outlined above.  Continue aspirin, statin, beta-blocker, nitrate and heparin.  Follow-up echo.  Pending LV dysfunction and renal function, will determine best course for ischemic evaluation.  3.  Hypertension: Stable.  4.  Hyperlipidemia: LDL of 75.  Statin intolerant.  Add Zetia.  5.  Acute on chronic stage III kidney disease: Follow with diuresis.  6.  DMII:  per IM.  Risk Assessment/Risk Scores:     TIMI Risk Score for Unstable Angina or Non-ST Elevation MI:   The patient's TIMI risk score is 5, which indicates a 26% risk of all cause mortality, new or recurrent myocardial infarction or need for urgent revascularization in the next 14 days.  New York Heart Association (NYHA) Functional Class NYHA Class IV    Signed, Nicolasa Ducking, NP 02/15/2023, 4:29 PM  For questions or updates, please contact   Please consult [email or web address] for contact info under Cardiology/STEMI.

## 2023-02-15 NOTE — Progress Notes (Signed)
PHARMACIST - PHYSICIAN ORDER COMMUNICATION  CONCERNING: P&T Medication Policy on Herbal Medications  DESCRIPTION:  This patient's order for:  Co Q-10 capsules  has been noted.  This product(s) is classified as an "herbal" or natural product. Due to a lack of definitive safety studies or FDA approval, nonstandard manufacturing practices, plus the potential risk of unknown drug-drug interactions while on inpatient medications, the Pharmacy and Therapeutics Committee does not permit the use of "herbal" or natural products of this type within [facility].   ACTION TAKEN: The pharmacy department is unable to verify this order at this time and your patient has been informed of this safety policy. Please reevaluate patient's clinical condition at discharge and address if the herbal or natural product(s) should be resumed at that time.

## 2023-02-16 ENCOUNTER — Other Ambulatory Visit: Payer: Self-pay

## 2023-02-16 ENCOUNTER — Telehealth (HOSPITAL_COMMUNITY): Payer: Self-pay | Admitting: Pharmacy Technician

## 2023-02-16 ENCOUNTER — Other Ambulatory Visit (HOSPITAL_COMMUNITY): Payer: Self-pay

## 2023-02-16 ENCOUNTER — Encounter: Payer: Self-pay | Admitting: Internal Medicine

## 2023-02-16 DIAGNOSIS — I214 Non-ST elevation (NSTEMI) myocardial infarction: Secondary | ICD-10-CM

## 2023-02-16 DIAGNOSIS — I420 Dilated cardiomyopathy: Secondary | ICD-10-CM

## 2023-02-16 DIAGNOSIS — J449 Chronic obstructive pulmonary disease, unspecified: Secondary | ICD-10-CM | POA: Diagnosis not present

## 2023-02-16 DIAGNOSIS — J9621 Acute and chronic respiratory failure with hypoxia: Secondary | ICD-10-CM

## 2023-02-16 DIAGNOSIS — I509 Heart failure, unspecified: Secondary | ICD-10-CM

## 2023-02-16 DIAGNOSIS — E1122 Type 2 diabetes mellitus with diabetic chronic kidney disease: Secondary | ICD-10-CM

## 2023-02-16 DIAGNOSIS — Z794 Long term (current) use of insulin: Secondary | ICD-10-CM

## 2023-02-16 DIAGNOSIS — I5033 Acute on chronic diastolic (congestive) heart failure: Secondary | ICD-10-CM | POA: Diagnosis not present

## 2023-02-16 LAB — BASIC METABOLIC PANEL
Anion gap: 9 (ref 5–15)
BUN: 68 mg/dL — ABNORMAL HIGH (ref 8–23)
CO2: 28 mmol/L (ref 22–32)
Calcium: 8.5 mg/dL — ABNORMAL LOW (ref 8.9–10.3)
Chloride: 100 mmol/L (ref 98–111)
Creatinine, Ser: 3.03 mg/dL — ABNORMAL HIGH (ref 0.61–1.24)
GFR, Estimated: 21 mL/min — ABNORMAL LOW (ref >60)
Glucose, Bld: 199 mg/dL — ABNORMAL HIGH (ref 70–99)
Potassium: 5.1 mmol/L (ref 3.5–5.1)
Sodium: 137 mmol/L (ref 135–145)

## 2023-02-16 LAB — HEPARIN LEVEL (UNFRACTIONATED): Heparin Unfractionated: 0.46 [IU]/mL (ref 0.30–0.70)

## 2023-02-16 LAB — GLUCOSE, CAPILLARY
Glucose-Capillary: 120 mg/dL — ABNORMAL HIGH (ref 70–99)
Glucose-Capillary: 178 mg/dL — ABNORMAL HIGH (ref 70–99)
Glucose-Capillary: 117 mg/dL — ABNORMAL HIGH (ref 70–99)
Glucose-Capillary: 150 mg/dL — ABNORMAL HIGH (ref 70–99)

## 2023-02-16 LAB — CBC
HCT: 38 % — ABNORMAL LOW (ref 39.0–52.0)
Hemoglobin: 11.6 g/dL — ABNORMAL LOW (ref 13.0–17.0)
MCH: 28.4 pg (ref 26.0–34.0)
MCHC: 30.5 g/dL (ref 30.0–36.0)
MCV: 93.1 fL (ref 80.0–100.0)
Platelets: 183 10*3/uL (ref 150–400)
RBC: 4.08 MIL/uL — ABNORMAL LOW (ref 4.22–5.81)
RDW: 17.2 % — ABNORMAL HIGH (ref 11.5–15.5)
WBC: 7.3 10*3/uL (ref 4.0–10.5)
nRBC: 0 % (ref 0.0–0.2)

## 2023-02-16 MED ORDER — IPRATROPIUM-ALBUTEROL 0.5-2.5 (3) MG/3ML IN SOLN
3.0000 mL | Freq: Two times a day (BID) | RESPIRATORY_TRACT | Status: DC
  Administered 2023-02-16 – 2023-02-19 (×6): 3 mL via RESPIRATORY_TRACT
  Filled 2023-02-16 (×6): qty 3

## 2023-02-16 MED ORDER — CHLORHEXIDINE GLUCONATE CLOTH 2 % EX PADS
6.0000 | MEDICATED_PAD | Freq: Every day | CUTANEOUS | Status: DC
  Administered 2023-02-16 – 2023-03-01 (×14): 6 via TOPICAL

## 2023-02-16 MED ORDER — OXYBUTYNIN CHLORIDE 5 MG PO TABS
5.0000 mg | ORAL_TABLET | Freq: Three times a day (TID) | ORAL | Status: DC
  Administered 2023-02-16 – 2023-02-18 (×8): 5 mg via ORAL
  Filled 2023-02-16 (×10): qty 1

## 2023-02-16 MED ORDER — LIDOCAINE 5 % EX PTCH
1.0000 | MEDICATED_PATCH | CUTANEOUS | Status: DC
  Filled 2023-02-16 (×2): qty 1

## 2023-02-16 MED ORDER — HEPARIN SODIUM (PORCINE) 5000 UNIT/ML IJ SOLN
5000.0000 [IU] | Freq: Three times a day (TID) | INTRAMUSCULAR | Status: DC
  Administered 2023-02-17 – 2023-02-25 (×26): 5000 [IU] via SUBCUTANEOUS
  Filled 2023-02-16 (×27): qty 1

## 2023-02-16 NOTE — Progress Notes (Signed)
PROGRESS NOTE    Tony Williams  UJW:[id number] DOB: 1945-11-30 DOA: 02/14/2023 PCP: Erasmo Downer, MD  [id number]  LOS: 2 days   Brief hospital course:   Assessment & Plan: 77 y.o. male with medical history significant of dCHF, HLD, HTN, DM, COPD on 3L oxygen, CAD (s/p of CABG and stent), gout, CKD-3b, depression with anxiety, morbid obesity, OSA, former smoker, bilateral cancer, who presents with SOB.   Patient states that he has shortness of breath in the past several weeks which has been progressively worsening.  Patient is dry cough, no fever or chills.  He had mild substernal chest pain earlier, which has resolved. Patient also has worsening bilateral lower leg edema and 15 pounds of weight gain recently.  Patient has nausea, no vomiting, diarrhea or abdominal pain.  No symptoms of UTI.  Patient states he has intermittent bilateral blurry vision for more than 3 weeks.  He reports mild numbness in both feet, and generalized weakness.  No unilateral weakness in extremities.  No facial droop or slurred speech.   Patient is normally using 3 L oxygen at baseline, but found to have acute respiratory distress, with oxygen desaturation to 82% and difficulty speaking in full sentence, currently requiring 6 L oxygen with 93% of saturation.   Acute on chronic respiratory failure with hypoxia due to acute on chronic diastolic CHF (congestive heart failure) (HCC): 2D echo on 10/29/2021 showed EF 50-55% with grade 1 diastolic dysfunction.  Patient has 3+ leg edema, 15 pounds weight gain, elevated BNP 627, clinically consistent with CHF exacerbation. --drop in ejection fraction down to 30% on echo down from 50% in 2023  --cardio consulted Plan: --IV lasix 40 x1 today, further diuresis pending kidney function and cardio rec.   Hx of CAD (coronary artery disease)  Trop elevation 2/2 demand ischemia --trop 609. Pt had mild chest pain earlier, which has resolved.  Patient is s/p of CABG and  stent placement. - IV heparin is started by EDP Plan: --Per cardio, No immediate plans for cardiac catheterization given worsening renal dysfunction.  Ideally needs cardiac catheterization though will have to wait pending renal function improvement.  Urinary retention --had bladder sphincter that urology had turned off, but still had retention.  Foley inserted today with immediate return of 700 ml. --oxybutynin for bladder discomfort   Chronic obstructive pulmonary disease on 3L home O2 No evidence of acute exacerbation   Blurry vision, bilateral: Etiology is not clear. It has been going on for more than 3 weeks intermittently.  No unilateral weakness in extremities.  No facial droop or slurred speech.  I offered to do MRI, but the patient has claustrophobia, need open MRI.  Patient states that he wants to see eye doctor first before doing MRI. -Will get CT of head --> negative for acute intracranial abnormalities. Plan: -Patient is on aspirin and statin -Fall precaution   Hypertension associated with diabetes (HCC) -IV hydralazine as needed -Patient is on IV Lasix --cont amlodipine, coreg and Imdur --hold oral hydralazine due to hypotension   Hyperlipidemia associated with type 2 diabetes mellitus (HCC)   Type II diabetes mellitus with renal manifestations (HCC):  Recent A1c 6.3, well-controlled.  Patient is taking glipizide at home. --SSI for now   Stage 3b chronic kidney disease (CKD) (HCC): Renal function is slightly worse than baseline.  Recent baseline creatinine 2.12 on 08/31/2022.  His creatinine is at 2.79, BUN 61, GFR 23 today.  May be due to cardiorenal syndrome. -Avoid using renal toxic  medications. --monitor Cr while diuresing     Chronic gout due to renal impairment without tophus -Allopurinol and colchicine   Depression with anxiety --cont Lexapro and trazodone   OSA (obstructive sleep apnea) -CPAP when sleeping   Morbid obesity (HCC): Body weight 142.9 kg,  BMI 45.2 -Encourage losing weight -Exercise and healthy diet    DVT prophylaxis: Heparin SQ Code Status: Full code  Family Communication: wife and daughter updated at bedside today Level of care: Telemetry Cardiac Dispo:   The patient is from: home Anticipated d/c is to: home Anticipated d/c date is: 2-3 days   Subjective and Interval History:  Pt had urinary retention this morning despite bladder sphincter having been turned off.  Foley inserted.  Pt reported feeling "drunk".  Also complained of left knee pain, chronic.   Objective: Vitals:   02/16/23 0723 02/16/23 0801 02/16/23 1248 02/16/23 1618  BP:  (!) 124/56 (!) 112/50 (!) 108/53  Pulse:  82 71 64  Resp:  20 20 20   Temp:  98.2 F (36.8 C) 98.1 F (36.7 C) 97.6 F (36.4 C)  TempSrc:      SpO2: 95% 93% 94% 96%  Weight:      Height:        Intake/Output Summary (Last 24 hours) at 02/16/2023 1914 Last data filed at 02/16/2023 1621 Gross per 24 hour  Intake 495.72 ml  Output 2984 ml  Net -2488.28 ml   Filed Weights   02/14/23 1639 02/15/23 0702 02/16/23 0422  Weight: (!) 142.9 kg (!) 142.9 kg (!) 144 kg    Examination:   Constitutional: NAD, AAOx3 HEENT: conjunctivae and lids normal, EOMI CV: No cyanosis.   RESP: normal respiratory effort, on 3L Neuro: II - XII grossly intact.   Psych: Normal mood and affect.  Appropriate judgement and reason   Data Reviewed: I have personally reviewed labs and imaging studies  Time spent: 50 minutes  Darlin Priestly, MD [facility] If 7PM-7AM, please contact night-coverage 02/16/2023, 7:14 PM

## 2023-02-16 NOTE — Discharge Instructions (Signed)

## 2023-02-16 NOTE — Progress Notes (Signed)
PHARMACY - ANTICOAGULATION CONSULT NOTE  Pharmacy Consult for heparin Indication: chest pain/ACS  Allergies  Allergen Reactions   Dilaudid [Hydromorphone] Hives    Rash and itch   Butorphanol Itching   Morphine Other (See Comments)    Redness around IV site   Pedi-Pre Tape Spray [Wound Dressing Adhesive] Itching   Statins Other (See Comments)    "Destroyed my muscles"   Zocor [Simvastatin] Other (See Comments)    "Destroyed my muscles"   Patient Measurements: Height: 5\' 10"  (177.8 cm) Weight: (!) 144 kg (317 lb 7.4 oz) IBW/kg (Calculated) : 73 Heparin Dosing Weight: 106.7 kg  Vital Signs: Temp: 97.7 F (36.5 C) (12/04 0340) BP: 123/56 (12/04 0340) Pulse Rate: 74 (12/04 0340)  Labs: Recent Labs    02/14/23 1642 02/14/23 1901 02/14/23 2152 02/15/23 0205 02/15/23 0653 02/15/23 1058 02/15/23 1859 02/16/23 0452  HGB 13.3  --   --  11.6*  --   --   --  11.6*  HCT 43.1  --   --  37.8*  --   --   --  38.0*  PLT 228  --   --  193  --   --   --  183  LABPROT  --  14.6  --   --   --   --   --   --   INR  --  1.1  --   --   --   --   --   --   HEPARINUNFRC  --   --    < > 0.15*  --  0.46 0.60 0.46  CREATININE 2.79*  --   --  2.79*  --   --   --   --   TROPONINIHS 609*  --    < > 535* 541* 458* 314*  --    < > = values in this interval not displayed.   Estimated Creatinine Clearance: 32.3 mL/min (A) (by C-G formula based on SCr of 2.79 mg/dL (H)).  Medical History: Past Medical History:  Diagnosis Date   Bladder cancer (HCC) 2013   CAD (coronary artery disease)    a. 2007 CABG x 4: LIMA->D1, VG->OM1, VG->D2->mLAD; b. 2018 NSTEMI/PCI: DES to VG->OM; c. 10/2020 NSTEMI/PCI: LM 90d, LAD 143m, D1 100, LCX 100ost/p, RCA sev diff dzs, VG->D2->mLAD 99/90 before D2 (4.0x15 On[employer] Frontier DES), 90 before mLAD (4.0x15 On[employer] Frontier DES), LIMA->D1 ok, VG->OM1 100; d. 10/2021 MV: no isch. mod basal to mid inf and antsept infarct.   Chronic heart failure with preserved ejection fraction  (HFpEF) (HCC)    a. 10/2020 Echo: EF 50-55%, apical HK, GrII DD, mod dil LA; b. 11/2020 Echo: EF 60-65%, no rwma; c. 10/2021 Echo: EF 50-55%, inf HK, GrI DD, nl RV fxn, mildly dil LA, mild MR.   Chronic kidney disease    COPD (chronic obstructive pulmonary disease) (HCC)    Depression    Diabetes mellitus without complication (HCC)    Hx of CABG x 4    LIMA-D2, SVG-OM (occluded), SeqSVG-D1-LAD.   Labile hypertension    Myocardial infarction [facility])    Assessment:  77 y/o male presenting with shortness of breath and lower extremity edema. PMH significant for CAD s/p CABG, CHF, COPD, and CKD. Pharmacy has been consulted to initiate heparin infusion. Per chart review, patient is not on anticoagulation prior to admission. Baseline labs: hgb 13.3, plt 228, INR 1.1.   Goal of Therapy:  Heparin level 0.3-0.7 units/ml Monitor platelets by anticoagulation protocol: Yes  Labs:  12/3 0205 HL 0.15 - subtherapeutic on 1400 units/hr  12/3 1058 HL 0.46 - therapeutic x 1 on 1800 units/hr  12/3 1859 HL 0.60 - therapeutic x 2 on 1800 units/hr 12/4 0452 HL 0.46 - therapeutic X 3    Plan:  HL therapeutic x 3 Continue heparin infusion at 1800 units/hr  Recheck HL with AM labs Monitor CBC daily while on heparin  Current stop time 12/4 1829  Thank you for involving pharmacy in this patient's care.   Arrielle Mcginn D, PharmD 02/16/2023 6:34 AM

## 2023-02-16 NOTE — Plan of Care (Signed)
  Problem: Education: Goal: Ability to describe self-care measures that may prevent or decrease complications (Diabetes Survival Skills Education) will improve Outcome: Progressing Goal: Individualized Educational Video(s) Outcome: Progressing   Problem: Coping: Goal: Ability to adjust to condition or change in health will improve Outcome: Progressing   Problem: Fluid Volume: Goal: Ability to maintain a balanced intake and output will improve Outcome: Progressing   Problem: Health Behavior/Discharge Planning: Goal: Ability to identify and utilize available resources and services will improve Outcome: Progressing Goal: Ability to manage health-related needs will improve Outcome: Progressing   Problem: Metabolic: Goal: Ability to maintain appropriate glucose levels will improve Outcome: Progressing   Problem: Nutritional: Goal: Maintenance of adequate nutrition will improve Outcome: Progressing Goal: Progress toward achieving an optimal weight will improve Outcome: Progressing   Problem: Skin Integrity: Goal: Risk for impaired skin integrity will decrease Outcome: Progressing   Problem: Tissue Perfusion: Goal: Adequacy of tissue perfusion will improve Outcome: Progressing   Problem: Education: Goal: Ability to demonstrate management of disease process will improve Outcome: Progressing Goal: Ability to verbalize understanding of medication therapies will improve Outcome: Progressing Goal: Individualized Educational Video(s) Outcome: Progressing   Problem: Activity: Goal: Capacity to carry out activities will improve Outcome: Progressing   Problem: Cardiac: Goal: Ability to achieve and maintain adequate cardiopulmonary perfusion will improve Outcome: Progressing   Problem: Education: Goal: Knowledge of General Education information will improve Description: Including pain rating scale, medication(s)/side effects and non-pharmacologic comfort measures Outcome:  Progressing   Problem: Health Behavior/Discharge Planning: Goal: Ability to manage health-related needs will improve Outcome: Progressing   Problem: Clinical Measurements: Goal: Ability to maintain clinical measurements within normal limits will improve Outcome: Progressing Goal: Will remain free from infection Outcome: Progressing Goal: Diagnostic test results will improve Outcome: Progressing Goal: Respiratory complications will improve Outcome: Progressing Goal: Cardiovascular complication will be avoided Outcome: Progressing   Problem: Activity: Goal: Risk for activity intolerance will decrease Outcome: Progressing   Problem: Nutrition: Goal: Adequate nutrition will be maintained Outcome: Progressing   Problem: Coping: Goal: Level of anxiety will decrease Outcome: Progressing   Problem: Elimination: Goal: Will not experience complications related to bowel motility Outcome: Progressing Goal: Will not experience complications related to urinary retention Outcome: Progressing   Problem: Pain Management: Goal: General experience of comfort will improve Outcome: Progressing   Problem: Safety: Goal: Ability to remain free from injury will improve Outcome: Progressing   Problem: Skin Integrity: Goal: Risk for impaired skin integrity will decrease Outcome: Progressing

## 2023-02-16 NOTE — Telephone Encounter (Signed)
Patient Product/process development scientist completed.    The patient is insured through [facility]. Patient has Medicare and is not eligible for a copay card, but may be able to apply for patient assistance, if available.    Ran test claim for Farixga 10 mg and the current 30 day co-pay is $45.00.  Ran test claim for Jardiance 10 mg and the current 30 day co-pay is $45.00.   This test claim was processed through [facility]- copay amounts may vary at other pharmacies due to pharmacy/plan contracts, or as the patient moves through the different stages of their insurance plan.     Roland Earl, CPHT Pharmacy Technician III Certified Patient Advocate [facility] Patient Advocate Team Direct Number: [phone]  Fax: [phone]

## 2023-02-16 NOTE — Progress Notes (Signed)
Rounding Note    Patient Name: Tony Williams Date of Encounter: 02/16/2023  [facility] Cardiologist: Debbe Odea, MD   Subjective   Lethargic this morning, family at the bedside Arousable but will go back to sleep quickly Did not wear his CPAP last night, family will bring it in later this afternoon Difficulty with voiding yesterday requiring urology to deflate sphincter pump, In-N-Out catheter with greater than 1 L out No urine output since that time (bucket empty) Feels like he has to go urinate but cannot get anything out. Nurses scheduled to do bladder scan this morning  Inpatient Medications    Scheduled Meds:  allopurinol  300 mg Oral Daily   amLODipine  10 mg Oral Daily   aspirin EC  81 mg Oral Daily   carvedilol  3.125 mg Oral BID WC   cholecalciferol  4,000 Units Oral Daily   clopidogrel  75 mg Oral Daily   colchicine  0.6 mg Oral BID   escitalopram  20 mg Oral Daily   ezetimibe  10 mg Oral Daily   fenofibrate  160 mg Oral Daily   furosemide  40 mg Intravenous Q12H   gabapentin  300 mg Oral TID   hydrALAZINE  100 mg Oral Q8H   insulin aspart  0-5 Units Subcutaneous QHS   insulin aspart  0-9 Units Subcutaneous TID WC   ipratropium-albuterol  3 mL Nebulization BID   isosorbide mononitrate  30 mg Oral Daily   loratadine  10 mg Oral Daily   pregabalin  100 mg Oral BID   traZODone  150 mg Oral QHS   Continuous Infusions:  heparin 1,800 Units/hr (02/16/23 1133)   PRN Meds: acetaminophen, albuterol, dextromethorphan-guaiFENesin, fentaNYL (SUBLIMAZE) injection, hydrALAZINE, nitroGLYCERIN, ondansetron (ZOFRAN) IV, oxyCODONE   Vital Signs    Vitals:   02/16/23 0422 02/16/23 0723 02/16/23 0801 02/16/23 1248  BP:   (!) 124/56 (!) 112/50  Pulse:   82 71  Resp:   20 20  Temp:   98.2 F (36.8 C) 98.1 F (36.7 C)  TempSrc:      SpO2:  95% 93% 94%  Weight: (!) 144 kg     Height:        Intake/Output Summary (Last 24 hours) at 02/16/2023  1257 Last data filed at 02/16/2023 1110 Gross per 24 hour  Intake 1187.85 ml  Output 3259 ml  Net -2071.15 ml      02/16/2023    4:22 AM 02/15/2023    7:02 AM 02/14/2023    4:39 PM  Last 3 Weights  Weight (lbs) 317 lb 7.4 oz 315 lb 0.6 oz 315 lb  Weight (kg) 144 kg 142.9 kg 142.883 kg      Telemetry    Normal sinus rhythm- Personally Reviewed  ECG     - Personally Reviewed  Physical Exam   GEN: No acute distress.  Obese, lethargic Neck: No JVD Cardiac: RRR, no murmurs, rubs, or gallops.  Respiratory: Clear to auscultation bilaterally. GI: Soft, nontender, non-distended  MS: No edema; No deformity. Neuro:  Nonfocal  Psych: Sleepy though arousable  Labs    High Sensitivity Troponin:   Recent Labs  Lab 02/14/23 2152 02/15/23 0205 02/15/23 0653 02/15/23 1058 02/15/23 1859  TROPONINIHS 550* 535* 541* 458* 314*     Chemistry Recent Labs  Lab 02/14/23 1642 02/15/23 0205 02/16/23 1039  NA 140 137 137  K 4.6 4.4 5.1  CL 103 103 100  CO2 27 25 28   GLUCOSE 82 111*  199*  BUN 61* 61* 68*  CREATININE 2.79* 2.79* 3.03*  CALCIUM 9.2 8.2* 8.5*  MG  --  2.2  --   GFRNONAA 23* 23* 21*  ANIONGAP 10 9 9     Lipids  Recent Labs  Lab 02/15/23 0205  CHOL 131  TRIG 123  HDL 31*  LDLCALC 75  CHOLHDL 4.2    Hematology Recent Labs  Lab 02/14/23 1642 02/15/23 0205 02/16/23 0452  WBC 9.0 7.5 7.3  RBC 4.67 4.10* 4.08*  HGB 13.3 11.6* 11.6*  HCT 43.1 37.8* 38.0*  MCV 92.3 92.2 93.1  MCH 28.5 28.3 28.4  MCHC 30.9 30.7 30.5  RDW 17.3* 17.2* 17.2*  PLT 228 193 183   Thyroid No results for input(s): "TSH", "FREET4" in the last 168 hours.  BNP Recent Labs  Lab 02/14/23 1642  BNP 627.9*    DDimer No results for input(s): "DDIMER" in the last 168 hours.   Radiology    ECHOCARDIOGRAM COMPLETE  Result Date: 02/15/2023    ECHOCARDIOGRAM REPORT   Patient Name:   Tony Williams Date of Exam: 02/15/2023 Medical Rec #:  [id number]        Height:       70.0 in  Accession #:    [id number]       Weight:       315.0 lb Date of Birth:  [birth year]        BSA:          2.532 m Patient Age:    77 years         BP:           112/50 mmHg Patient Gender: M                HR:           70 bpm. Exam Location:  [facility] Procedure: 2D Echo, Cardiac Doppler, Color Doppler and Intracardiac            Opacification Agent Indications:     CHF  History:         Patient has prior history of Echocardiogram examinations, most                  recent 10/29/2021. CHF, CAD, Previous Myocardial Infarction and                  Angina, Prior CABG, COPD, Arrythmias:Bradycardia,                  Signs/Symptoms:Dizziness/Lightheadedness and Dyspnea; Risk                  Factors:Hypertension, Diabetes, Dyslipidemia, Sleep Apnea and                  Former Smoker. CKD.  Sonographer:     Mikki Harbor Referring Phys:  [id number] Lorretta Harp Diagnosing Phys: Yvonne Kendall MD  Sonographer Comments: Technically difficult study due to poor echo windows. Image acquisition challenging due to patient body habitus. IMPRESSIONS  1. Left ventricular ejection fraction, by estimation, is 30 to 35%. The left ventricle has moderately decreased function. Left ventricular endocardial border not optimally defined to evaluate regional wall motion. The left ventricular internal cavity size was mildly to moderately dilated. There is mild left ventricular hypertrophy. Left ventricular diastolic parameters are consistent with Grade II diastolic dysfunction (pseudonormalization). Elevated left atrial pressure.  2. Right ventricular systolic function was not well visualized. The right ventricular size is not well visualized.  3. Left atrial  size was mildly dilated.  4. Right atrial size was moderately dilated.  5. The mitral valve was not well visualized. No evidence of mitral valve regurgitation. No evidence of mitral stenosis.  6. The aortic valve has an indeterminant number of cusps. Aortic valve regurgitation is trivial.  7.  Pulmonic valve regurgitation not well-assessed. FINDINGS  Left Ventricle: Left ventricular ejection fraction, by estimation, is 30 to 35%. The left ventricle has moderately decreased function. Left ventricular endocardial border not optimally defined to evaluate regional wall motion. Definity contrast agent was given IV to delineate the left ventricular endocardial borders. The left ventricular internal cavity size was mildly to moderately dilated. There is mild left ventricular hypertrophy. Left ventricular diastolic parameters are consistent with Grade II  diastolic dysfunction (pseudonormalization). Elevated left atrial pressure. Right Ventricle: The right ventricular size is not well visualized. Right vetricular wall thickness was not well visualized. Right ventricular systolic function was not well visualized. Left Atrium: Left atrial size was mildly dilated. Right Atrium: Right atrial size was moderately dilated. Pericardium: There is no evidence of pericardial effusion. Mitral Valve: The mitral valve was not well visualized. There is mild thickening of the mitral valve leaflet(s). No evidence of mitral valve regurgitation. No evidence of mitral valve stenosis. MV peak gradient, 7.1 mmHg. The mean mitral valve gradient is 2.0 mmHg. Tricuspid Valve: The tricuspid valve is not well visualized. Tricuspid valve regurgitation is trivial. Aortic Valve: The aortic valve has an indeterminant number of cusps. Aortic valve regurgitation is trivial. Aortic valve mean gradient measures 5.0 mmHg. Aortic valve peak gradient measures 9.5 mmHg. Aortic valve area, by VTI measures 2.60 cm. Pulmonic Valve: The pulmonic valve was not well visualized. Pulmonic valve regurgitation not well-assessed. Aorta: The aortic root is normal in size and structure. Pulmonary Artery: The pulmonary artery is not well seen. Venous: The inferior vena cava was not well visualized. IAS/Shunts: The interatrial septum was not well visualized.  LEFT  VENTRICLE PLAX 2D LVIDd:         6.10 cm   Diastology LVIDs:         5.40 cm   LV e' medial:    5.87 cm/s LV PW:         1.10 cm   LV E/e' medial:  19.6 LV IVS:        1.30 cm   LV e' lateral:   6.74 cm/s LVOT diam:     2.20 cm   LV E/e' lateral: 17.1 LV SV:         92 LV SV Index:   36 LVOT Area:     3.80 cm  LEFT ATRIUM             Index        RIGHT ATRIUM           Index LA diam:        5.90 cm 2.33 cm/m   RA Area:     28.50 cm LA Vol (A2C):   91.6 ml 36.18 ml/m  RA Volume:   113.00 ml 44.63 ml/m LA Vol (A4C):   86.9 ml 34.32 ml/m LA Biplane Vol: 90.1 ml 35.59 ml/m  AORTIC VALVE AV Area (Vmax):    2.84 cm AV Area (Vmean):   2.47 cm AV Area (VTI):     2.60 cm AV Vmax:           154.00 cm/s AV Vmean:          102.000 cm/s AV VTI:  0.352 m AV Peak Grad:      9.5 mmHg AV Mean Grad:      5.0 mmHg LVOT Vmax:         115.00 cm/s LVOT Vmean:        66.200 cm/s LVOT VTI:          0.241 m LVOT/AV VTI ratio: 0.68  AORTA Ao Root diam: 3.70 cm Ao Asc diam:  3.00 cm MITRAL VALVE                TRICUSPID VALVE MV Area (PHT): 2.94 cm     TR Peak grad:   21.0 mmHg MV Area VTI:   2.07 cm     TR Vmax:        229.00 cm/s MV Peak grad:  7.1 mmHg MV Mean grad:  2.0 mmHg     SHUNTS MV Vmax:       1.33 m/s     Systemic VTI:  0.24 m MV Vmean:      70.0 cm/s    Systemic Diam: 2.20 cm MV Decel Time: 258 msec MV E velocity: 115.00 cm/s MV A velocity: 106.00 cm/s MV E/A ratio:  1.08 Yvonne Kendall MD Electronically signed by Yvonne Kendall MD Signature Date/Time: 02/15/2023/5:29:17 PM    Final    DG Chest Port 1 View  Result Date: 02/14/2023 CLINICAL DATA:  Shortness of breath EXAM: PORTABLE CHEST 1 VIEW COMPARISON:  08/04/2022, 12/28/2021, 01/28/2021 FINDINGS: Cardio post sternotomy changes. Cardiomegaly with vascular congestion and probable mild interstitial edema. Chronic pleural and parenchymal scarring on the right. No visible pneumothorax. IMPRESSION: Cardiomegaly with vascular congestion and probable mild  interstitial edema. Chronic pleural and parenchymal scarring in the right thorax Electronically Signed   By: Jasmine Pang M.D.   On: 02/14/2023 20:38   CT HEAD WO CONTRAST ( )  Result Date: 02/14/2023 CLINICAL DATA:  Neuro deficit with acute stroke suspected EXAM: CT HEAD WITHOUT CONTRAST TECHNIQUE: Contiguous axial images were obtained from the base of the skull through the vertex without intravenous contrast. RADIATION DOSE REDUCTION: This exam was performed according to the departmental dose-optimization program which includes automated exposure control, adjustment of the mA and/or kV according to patient size and/or use of iterative reconstruction technique. COMPARISON:  11/10/2021 FINDINGS: Brain: No evidence of acute infarction, hemorrhage, hydrocephalus, extra-axial collection or mass lesion/mass effect. Chronic small vessel ischemia in the cerebral white matter that is unchanged. Small area of encephalomalacia in the anterior left frontal lobe, likely post ischemic, unchanged. Vascular: No hyperdense vessel or unexpected calcification. Skull: Normal. Negative for fracture or focal lesion. Sinuses/Orbits: No acute finding. IMPRESSION: No acute finding.  Stable chronic ischemic injury Electronically Signed   By: Tiburcio Pea M.D.   On: 02/14/2023 19:51    Cardiac Studies   Echo  1. Left ventricular ejection fraction, by estimation, is 30 to 35%. The  left ventricle has moderately decreased function. Left ventricular  endocardial border not optimally defined to evaluate regional wall motion.  The left ventricular internal cavity  size was mildly to moderately dilated. There is mild left ventricular  hypertrophy. Left ventricular diastolic parameters are consistent with  Grade II diastolic dysfunction (pseudonormalization). Elevated left atrial  pressure.   2. Right ventricular systolic function was not well visualized. The right  ventricular size is not well visualized.   3. Left atrial  size was mildly dilated.   4. Right atrial size was moderately dilated.   5. The mitral valve was not well visualized. No  evidence of mitral valve  regurgitation. No evidence of mitral stenosis.   6. The aortic valve has an indeterminant number of cusps. Aortic valve  regurgitation is trivial.   7. Pulmonic valve regurgitation not well-assessed.   Patient Profile     Mr. Gates Demoulin is a 77 year old male with history of CAD status post CABG and subsequent PCI's, chronic HFpEF, hypertension, hyperlipidemia, type 2 diabetes mellitus, statin intolerance, COPD, CKD stage III, and obstructive sleep apnea, whom we have been asked to see due to acute on chronic heart failure. He reports 3-week history of progressive lower extremity edema and shortness of breath associated with a 20 pound weight gain   Assessment & Plan    Acute on chronic diastolic and systolic CHF Presenting with weight gain, worsening leg swelling, shortness of breath Elevated BNP 600, chest x-ray with pulmonary edema Clinical exam consistent with fluid overload Started this admission on IV Lasix 40 twice daily Difficulty voiding, requiring deflation of urologic sphincter pump, urology with In-N-Out catheter yesterday 1600 out, none since that time Nurses to BladderScan Worsening renal function concerning for ATN/hydronephrosis Consider renal ultrasound though likely will be difficult given body habitus -If renal function continues to worsen, will need nephrology to follow Longstanding history of diabetes  Coronary artery disease/demand ischemia Troponin 5-600, nontrending No symptoms concerning for angina Of concern his drop in ejection fraction down to 30% on echo down from 50% in 2023 No immediate plans for cardiac catheterization given worsening renal dysfunction Ideally needs cardiac catheterization though will have to wait pending renal function improvement  Acute on chronic stage III kidney disease Baseline  creatinine 2.2, presenting creatinine 2.79, now trending upwards 3.0 Concern for urine retention, hydronephrosis, ATN Consider renal ultrasound Bladder scan Q6, Foley placement if needed Will hold Lasix this evening, recheck creatinine in morning before restarting  Obstructive sleep apnea Slept without CPAP last night, family to bring in CPAP.  Would likely help mentation as he is very lethargic today Would recommend he wear CPAP when napping during the daytime  Diabetes type 2 Management per medicine service A1c 6.8  On discussion with family at the bedside, and with medicine team, pharmacy  For questions or updates, please contact [facility] Please consult [email or web address] for contact info under        Signed, Julien Nordmann, MD  02/16/2023, 12:57 PM

## 2023-02-16 NOTE — Progress Notes (Signed)
Heart Failure Stewardship Pharmacy Note  PCP: Erasmo Downer, MD PCP-Cardiologist: Debbe Odea, MD  HPI: Tony Williams is a 77 y.o. male with dCHF, HLD, HTN, DM, COPD on 3L oxygen, CAD (s/p of CABG and stent), gout, CKD-3b, depression with anxiety, morbid obesity, OSA, former smoker, bladder cancer who presented with worsening shortness of breath. BNP on admission was 627.9. Troponin on admission was 609 and trended down over time. CXR showed vascular congestion and mild interstitial edema. Has had issues this admission with urinary retention requiring catheterization.   Pertinent cardiac history: Distant history of CABG. Normal LVEF in 2022. Underwent high risk PCI to SVG-D2-LAD with IABP assistance in 10/2020. Normal LVEF in 10/2021. Echocardiogram this admission showed LVEF of 30-35% and grade II diastolic dysfunction.   Pertinent Lab Values: Creatinine, Ser  Date Value Ref Range Status  02/16/2023 3.03 (H) 0.61 - 1.24 mg/dL Final   BUN  Date Value Ref Range Status  02/16/2023 68 (H) 8 - 23 mg/dL Final  16/12/9602 40 (H) 8 - 27 mg/dL Final   Potassium  Date Value Ref Range Status  02/16/2023 5.1 3.5 - 5.1 mmol/L Final   Sodium  Date Value Ref Range Status  02/16/2023 137 135 - 145 mmol/L Final  08/10/2022 141 134 - 144 mmol/L Final   B Natriuretic Peptide  Date Value Ref Range Status  02/14/2023 627.9 (H) 0.0 - 100.0 pg/mL Final    Comment:    Performed at [facility], [address] [place], [place] [address]   Magnesium  Date Value Ref Range Status  02/15/2023 2.2 1.7 - 2.4 mg/dL Final    Comment:    Performed at [facility], [address] [place], [place] [address]   Hgb A1c MFr Bld  Date Value Ref Range Status  02/15/2023 6.8 (H) 4.8 - 5.6 % Final    Comment:    (NOTE) Pre diabetes:          5.7%-6.4%  Diabetes:              >6.4%  Glycemic control for   <7.0% adults with diabetes    TSH  Date Value Ref Range  Status  12/08/2020 2.515 0.350 - 4.500 uIU/mL Final    Comment:    Performed by a 3rd Generation assay with a functional sensitivity of <=0.01 uIU/mL. Performed at [facility], [address] [place], [place] [address]   04/22/2020 4.100 0.450 - 4.500 uIU/mL Final    Vital Signs: Admission weight: 315 lbs Temp:  [97.5 F (36.4 C)-98.2 F (36.8 C)] 98.1 F (36.7 C) (12/04 1248) Pulse Rate:  [65-82] 71 (12/04 1248) Cardiac Rhythm: Normal sinus rhythm (12/04 0700) Resp:  [16-20] 20 (12/04 1248) BP: (103-124)/(50-57) 112/50 (12/04 1248) SpO2:  [91 %-96 %] 94 % (12/04 1248) Weight:  [144 kg (317 lb 7.4 oz)] 144 kg (317 lb 7.4 oz) (12/04 0422)  Intake/Output Summary (Last 24 hours) at 02/16/2023 1510 Last data filed at 02/16/2023 1110 Gross per 24 hour  Intake 1187.85 ml  Output 3259 ml  Net -2071.15 ml    Current Heart Failure Medications:  Loop diuretic: none Beta-Blocker: carvedilol 3.125 mg bid ACEI/ARB/ARNI: none MRA: none Other vasoactive medications include amlodipine 10 mg daily, Imdur 30 mg daily   Prior to admission Heart Failure Medications:  Loop diuretic: torsemide 20 mg daily + metolazone 2.5 mg on Wednesday Beta-Blocker: carvedilol 3.125 mg bid ACEI/ARB/ARNI: none MRA: none SGLT2i: none Other vasoactive medications include amlodipine 10 mg  daily, clonidine 0.1 mg daily, Imdur 30 mg daily, hydralazine 100 mg TID  Assessment: 1. Acute combined systolic and diastolic heart failure (LVEF 30-35%) with grade II diastolic dysfunction, due to ICM. NYHA class III-IV symptoms.  -Symptoms: Denies shortness of breath at rest. Unable to assess dyspnea on exertion. LEE present. Denies orthopnea. Reports feeling "drunk" today. Extremities are warm, appetite is fair, and bicarbonate on BMET is not low, which are not suggestive of low output HF.  -Volume: Appears volume overloaded, but diuresis has been impeded by urinary retention, now requiring craterization.  Creatinine is trending up. Will follow renal studies mention in provider notes. -Hemodynamics: BP is stable 100-120s/50s. Agree with stopping clonidine and holding hydralazine for now. -BB: Continue carvedilol 3.125 mg bid. Can consider increasing dose when volume status is stable to help wean off clonidine.  -ACEI/ARB/ARNI: Not ideal with AKI on CKD. -MRA: Contraindicated due to creatinine.  -SGLT2i: Body habitus and low mobility may increase risk of ADEs. Would benefit CKD. Can consider pending patient-provider risk benefit discussion.  Plan: 1) Medication changes recommended at this time: -None.   2) Patient assistance: -Pending  3) Education: - Patient has been educated on current HF medications and potential additions to HF medication regimen - Patient verbalizes understanding that over the next few months, these medication doses may change and more medications may be added to optimize HF regimen - Patient has been educated on basic disease state pathophysiology and goals of therapy  Medication Assistance / Insurance Benefits Check: Does the patient have prescription insurance?    Type of insurance plan:  Does the patient qualify for medication assistance through manufacturers or grants? Pending   Outpatient Pharmacy: Prior to admission outpatient pharmacy: [facility]     Please do not hesitate to reach out with questions or concerns,  Enos Fling, PharmD, CPP, BCPS Heart Failure Pharmacist  Phone - [phone] 02/16/2023 3:48 PM

## 2023-02-16 NOTE — TOC Progression Note (Signed)
Transition of Care [facility]) - Progression Note    Patient Details  Name: Bradlyn Underwood MRN: [id number] Date of Birth: 1945/03/18  Transition of Care [facility]) CM/SW Contact  Truddie Hidden, RN Phone Number: 02/16/2023, 3:31 PM  Clinical Narrative:    TOC continuing to follow patient's progress throughout discharge planning.        Expected Discharge Plan and Services                                               Social Determinants of Health (SDOH) Interventions SDOH Screenings   Food Insecurity: No Food Insecurity (02/15/2023)  Housing: Low Risk  (02/15/2023)  Transportation Needs: No Transportation Needs (02/15/2023)  Utilities: Not At Risk (02/15/2023)  Alcohol Screen: Low Risk  (08/10/2022)  Depression (PHQ2-9): Low Risk  (08/10/2022)  Financial Resource Strain: Low Risk  (05/19/2021)  Physical Activity: Inactive (05/19/2021)  Social Connections: Unknown (05/19/2021)  Stress: No Stress Concern Present (05/19/2021)  Recent Concern: Stress - Stress Concern Present (02/26/2021)  Tobacco Use: Medium Risk (02/16/2023)    Readmission Risk Interventions    02/15/2023    9:37 AM 12/11/2020   11:06 AM  Readmission Risk Prevention Plan  Transportation Screening Complete Complete  PCP or Specialist Appt within 5-7 Days  Complete  PCP or Specialist Appt within 3-5 Days Complete   Medication Review (RN CM)  Complete  HRI or Home Care Consult Complete   Social Work Consult for Recovery Care Planning/Counseling Complete   Palliative Care Screening Not Applicable   Medication Review Oceanographer) Not Complete   Med Review Comments patient will discuss medication upon discharge

## 2023-02-16 NOTE — Plan of Care (Signed)
Progressing towards goals

## 2023-02-17 ENCOUNTER — Inpatient Hospital Stay: Payer: Medicare Other

## 2023-02-17 DIAGNOSIS — I5033 Acute on chronic diastolic (congestive) heart failure: Secondary | ICD-10-CM | POA: Diagnosis not present

## 2023-02-17 DIAGNOSIS — I5023 Acute on chronic systolic (congestive) heart failure: Secondary | ICD-10-CM | POA: Diagnosis not present

## 2023-02-17 LAB — LACTIC ACID, PLASMA
Lactic Acid, Venous: 0.7 mmol/L (ref 0.5–1.9)
Lactic Acid, Venous: 0.6 mmol/L (ref 0.5–1.9)

## 2023-02-17 LAB — BLOOD GAS, VENOUS
Acid-Base Excess: 3.2 mmol/L — ABNORMAL HIGH (ref 0.0–2.0)
Bicarbonate: 32 mmol/L — ABNORMAL HIGH (ref 20.0–28.0)
O2 Saturation: 91.6 %
Patient temperature: 37
pCO2, Ven: 68 mm[Hg] — ABNORMAL HIGH (ref 44–60)
pH, Ven: 7.28 (ref 7.25–7.43)
pO2, Ven: 60 mm[Hg] — ABNORMAL HIGH (ref 32–45)

## 2023-02-17 LAB — BASIC METABOLIC PANEL
Anion gap: 9 (ref 5–15)
Anion gap: 9 (ref 5–15)
BUN: 70 mg/dL — ABNORMAL HIGH (ref 8–23)
BUN: 77 mg/dL — ABNORMAL HIGH (ref 8–23)
CO2: 29 mmol/L (ref 22–32)
CO2: 29 mmol/L (ref 22–32)
Calcium: 8.9 mg/dL (ref 8.9–10.3)
Calcium: 8.6 mg/dL — ABNORMAL LOW (ref 8.9–10.3)
Chloride: 100 mmol/L (ref 98–111)
Chloride: 100 mmol/L (ref 98–111)
Creatinine, Ser: 3.19 mg/dL — ABNORMAL HIGH (ref 0.61–1.24)
Creatinine, Ser: 3.41 mg/dL — ABNORMAL HIGH (ref 0.61–1.24)
GFR, Estimated: 19 mL/min — ABNORMAL LOW (ref >60)
GFR, Estimated: 18 mL/min — ABNORMAL LOW (ref >60)
Glucose, Bld: 149 mg/dL — ABNORMAL HIGH (ref 70–99)
Glucose, Bld: 125 mg/dL — ABNORMAL HIGH (ref 70–99)
Potassium: 5.4 mmol/L — ABNORMAL HIGH (ref 3.5–5.1)
Potassium: 5.1 mmol/L (ref 3.5–5.1)
Sodium: 138 mmol/L (ref 135–145)
Sodium: 138 mmol/L (ref 135–145)

## 2023-02-17 LAB — CBC
HCT: 39 % (ref 39.0–52.0)
Hemoglobin: 12 g/dL — ABNORMAL LOW (ref 13.0–17.0)
MCH: 28.3 pg (ref 26.0–34.0)
MCHC: 30.8 g/dL (ref 30.0–36.0)
MCV: 92 fL (ref 80.0–100.0)
Platelets: 206 10*3/uL (ref 150–400)
RBC: 4.24 MIL/uL (ref 4.22–5.81)
RDW: 17.1 % — ABNORMAL HIGH (ref 11.5–15.5)
WBC: 8.6 10*3/uL (ref 4.0–10.5)
nRBC: 0 % (ref 0.0–0.2)

## 2023-02-17 LAB — GLUCOSE, CAPILLARY
Glucose-Capillary: 144 mg/dL — ABNORMAL HIGH (ref 70–99)
Glucose-Capillary: 155 mg/dL — ABNORMAL HIGH (ref 70–99)
Glucose-Capillary: 117 mg/dL — ABNORMAL HIGH (ref 70–99)
Glucose-Capillary: 125 mg/dL — ABNORMAL HIGH (ref 70–99)

## 2023-02-17 LAB — MAGNESIUM: Magnesium: 2.3 mg/dL (ref 1.7–2.4)

## 2023-02-17 LAB — HEPARIN LEVEL (UNFRACTIONATED): Heparin Unfractionated: <0.1 [IU]/mL — ABNORMAL LOW (ref 0.30–0.70)

## 2023-02-17 MED ORDER — FUROSEMIDE 10 MG/ML IJ SOLN
80.0000 mg | Freq: Two times a day (BID) | INTRAMUSCULAR | Status: DC
  Administered 2023-02-17 (×2): 80 mg via INTRAVENOUS
  Filled 2023-02-17 (×3): qty 8

## 2023-02-17 MED ORDER — PRAVASTATIN SODIUM 40 MG PO TABS
40.0000 mg | ORAL_TABLET | Freq: Every day | ORAL | Status: DC
  Administered 2023-02-17 – 2023-03-14 (×24): 40 mg via ORAL
  Filled 2023-02-17 (×25): qty 1

## 2023-02-17 MED ORDER — POLYVINYL ALCOHOL 1.4 % OP SOLN: 1.0000 [drp] | OPHTHALMIC | Status: DC | PRN

## 2023-02-17 MED ORDER — METOLAZONE 5 MG PO TABS
5.0000 mg | ORAL_TABLET | Freq: Once | ORAL | Status: AC
  Administered 2023-02-17: 5 mg via ORAL
  Filled 2023-02-17: qty 1

## 2023-02-17 MED ORDER — LIDOCAINE 5 % EX PTCH
2.0000 | MEDICATED_PATCH | CUTANEOUS | Status: DC
  Administered 2023-02-17 – 2023-03-03 (×10): 2 via TRANSDERMAL
  Filled 2023-02-17 (×28): qty 2

## 2023-02-17 NOTE — Plan of Care (Signed)
Progressing towards goals

## 2023-02-17 NOTE — TOC Progression Note (Signed)
Transition of Care [facility]) - Progression Note    Patient Details  Name: Tony Williams MRN: [id number] Date of Birth: 1945-07-16  Transition of Care [facility]) CM/SW Contact  Truddie Hidden, RN Phone Number: 02/17/2023, 10:24 AM  Clinical Narrative:    TOC continuing to follow patient's progress throughout discharge planning.        Expected Discharge Plan and Services                                               Social Determinants of Health (SDOH) Interventions SDOH Screenings   Food Insecurity: No Food Insecurity (02/15/2023)  Housing: Low Risk  (02/15/2023)  Transportation Needs: No Transportation Needs (02/15/2023)  Utilities: Not At Risk (02/15/2023)  Alcohol Screen: Low Risk  (08/10/2022)  Depression (PHQ2-9): Low Risk  (08/10/2022)  Financial Resource Strain: Low Risk  (05/19/2021)  Physical Activity: Inactive (05/19/2021)  Social Connections: Unknown (05/19/2021)  Stress: No Stress Concern Present (05/19/2021)  Recent Concern: Stress - Stress Concern Present (02/26/2021)  Tobacco Use: Medium Risk (02/16/2023)    Readmission Risk Interventions    02/15/2023    9:37 AM 12/11/2020   11:06 AM  Readmission Risk Prevention Plan  Transportation Screening Complete Complete  PCP or Specialist Appt within 5-7 Days  Complete  PCP or Specialist Appt within 3-5 Days Complete   Medication Review (RN CM)  Complete  HRI or Home Care Consult Complete   Social Work Consult for Recovery Care Planning/Counseling Complete   Palliative Care Screening Not Applicable   Medication Review Oceanographer) Not Complete   Med Review Comments patient will discuss medication upon discharge

## 2023-02-17 NOTE — Evaluation (Signed)
Physical Therapy Evaluation Patient Details Name: Tony Williams MRN: [id number] DOB: Sep 06, 1945 Today's Date: 02/17/2023  History of Present Illness  [name] is a 77 year old male with history of CAD status post CABG and subsequent PCI's, chronic HFpEF, hypertension, hyperlipidemia, type 2 diabetes mellitus, statin intolerance, COPD, CKD stage III, and obstructive sleep apnea, who presented with SOB as well as 3-week history of progressive lower extremity edema and shortness of breath associated with a 20 pound weight gain.   Clinical Impression  Patient lethargic during PT visit but oriented to self, family in room, place. Family at bedside able to assist with and confirm PLOF. Pt utilized RW for the last 2 weeks prior to that was independent, lives with family at home. The pt was able to follow commands but did fall asleep if not directly interacted with, but also did fall asleep during questions. Supine <> sit minA. Noted for fair balance and BUE tremors, unable to maintain grip on bed rail. Lateral scoot minA towards HOB due to safety concerns with attempting to stand. Returned to supine and HOB elevated in prep for lunch.  RN notified of pt status. Overall the patient demonstrated deficits (see "PT Problem List") that impede the patient's functional abilities, safety, and mobility and would benefit from skilled PT intervention.        If plan is discharge home, recommend the following: Two people to help with walking and/or transfers;Two people to help with bathing/dressing/bathroom;Help with stairs or ramp for entrance;Assist for transportation;Assistance with cooking/housework   Can travel by private vehicle   No    Equipment Recommendations Other (comment) (TBD at next venue of care)  Recommendations for Other Services       Functional Status Assessment Patient has had a recent decline in their functional status and demonstrates the ability to make significant  improvements in function in a reasonable and predictable amount of time.     Precautions / Restrictions Precautions Precautions: Fall Restrictions Weight Bearing Restrictions: No      Mobility  Bed Mobility Overal bed mobility: Needs Assistance Bed Mobility: Supine to Sit, Sit to Supine     Supine to sit: HOB elevated, Min assist Sit to supine: HOB elevated, Min assist        Transfers Overall transfer level: Needs assistance   Transfers: Bed to chair/wheelchair/BSC            Lateral/Scoot Transfers: Min assist      Ambulation/Gait                  Stairs            Wheelchair Mobility     Tilt Bed    Modified Rankin (Stroke Patients Only)       Balance Overall balance assessment: Needs assistance Sitting-balance support: Feet supported, Bilateral upper extremity supported Sitting balance-Leahy Scale: Fair Sitting balance - Comments: falling asleep sitting up CGA for safety throughout                                     Pertinent Vitals/Pain Pain Assessment Pain Assessment: Faces Faces Pain Scale: Hurts a little bit Pain Location: with L knee movement    Home Living Family/patient expects to be discharged to:: Private residence Living Arrangements: Spouse/significant other Available Help at Discharge: Family;Available 24 hours/day Type of Home: House Home Access: Stairs to enter Entrance Stairs-Rails: None Entrance Stairs-Number of Steps: at garage,  1   Home Layout: One level Home Equipment: Rolling Walker (2 wheels);Grab bars - tub/shower;Shower seat - built in      Prior Function Prior Level of Function : Independent/Modified Independent             Mobility Comments: [place] walker for last few weeks, but normally independent for HH distances ADLs Comments: wife helps assists with bathing, occasional LB dressing, 2/2 baseline knee issues. Has required increased assistance the last 2-3 weeks.      Extremity/Trunk Assessment   Upper Extremity Assessment Upper Extremity Assessment: Generalized weakness    Lower Extremity Assessment Lower Extremity Assessment: Generalized weakness       Communication      Cognition Arousal: Lethargic Behavior During Therapy: WFL for tasks assessed/performed Overall Cognitive Status: Within Functional Limits for tasks assessed                                 General Comments: pt oriented to self, family, some PLOF. questions also answered by wife        General Comments General comments (skin integrity, edema, etc.): Pt noted with O2 sats at 90% at rest on 3L [place]. They increase to 93% upon initial transition to upright sitting at eOB, however pt fatigues quickly and begins to desat with prolonged sitting activity. Noted to drop to 84% at lowest during session with pt on 3L [place]. Pt rebounds to 90% after return to supine and cues for PLB strategies. VSS at end of session with pt on 3L [place].    Exercises     Assessment/Plan    PT Assessment Patient needs continued PT services  PT Problem List Decreased strength;Decreased activity tolerance;Decreased balance;Decreased mobility;Decreased knowledge of use of DME       PT Treatment Interventions DME instruction;Neuromuscular re-education;Gait training;Stair training;Patient/family education;Therapeutic activities;Functional mobility training;Therapeutic exercise;Balance training    PT Goals (Current goals can be found in the Care Plan section)  Acute Rehab PT Goals Patient Stated Goal: return to PLOF PT Goal Formulation: With family Time For Goal Achievement: 03/03/23 Potential to Achieve Goals: Good    Frequency Min 1X/week     Co-evaluation               AM-PAC PT "6 Clicks" Mobility  Outcome Measure Help needed turning from your back to your side while in a flat bed without using bedrails?: A Little Help needed moving from lying on your back to sitting on the  side of a flat bed without using bedrails?: A Little Help needed moving to and from a bed to a chair (including a wheelchair)?: A Lot Help needed standing up from a chair using your arms (e.g., wheelchair or bedside chair)?: A Lot Help needed to walk in hospital room?: A Lot Help needed climbing 3-5 steps with a railing? : Total 6 Click Score: 13    End of Session   Activity Tolerance: Patient limited by lethargy Patient left: in bed;with call bell/phone within reach;with bed alarm set;with family/visitor present Nurse Communication: Mobility status PT Visit Diagnosis: Other abnormalities of gait and mobility (R26.89);Muscle weakness (generalized) (M62.81);Difficulty in walking, not elsewhere classified (R26.2)    Time: 1610-9604 PT Time Calculation (min) (ACUTE ONLY): 13 min   Charges:   PT Evaluation $PT Eval Low Complexity: 1 Low   PT General Charges $$ ACUTE PT VISIT: 1 Visit        Olga Coaster PT, DPT 1:58  PM,02/17/23

## 2023-02-17 NOTE — Progress Notes (Signed)
Rounding Note    Patient Name: Tony Williams Date of Encounter: 02/17/2023  [facility] Cardiologist: Debbe Odea, MD   Subjective   Patient feels breathing is the same, says he is overall feeling poorly. UOP -1.1L.  kidney function worse 3.03>3.19. K 5.4. still volume up on exam.  Inpatient Medications    Scheduled Meds:  allopurinol  300 mg Oral Daily   amLODipine  10 mg Oral Daily   aspirin EC  81 mg Oral Daily   carvedilol  3.125 mg Oral BID WC   Chlorhexidine Gluconate Cloth  6 each Topical Daily   cholecalciferol  4,000 Units Oral Daily   clopidogrel  75 mg Oral Daily   colchicine  0.6 mg Oral BID   escitalopram  20 mg Oral Daily   ezetimibe  10 mg Oral Daily   fenofibrate  160 mg Oral Daily   gabapentin  300 mg Oral TID   heparin injection (subcutaneous)  5,000 Units Subcutaneous Q8H   insulin aspart  0-5 Units Subcutaneous QHS   insulin aspart  0-9 Units Subcutaneous TID WC   ipratropium-albuterol  3 mL Nebulization BID   isosorbide mononitrate  30 mg Oral Daily   lidocaine  1 patch Transdermal Q24H   loratadine  10 mg Oral Daily   oxybutynin  5 mg Oral TID   pravastatin  40 mg Oral q1800   pregabalin  100 mg Oral BID   traZODone  150 mg Oral QHS   Continuous Infusions:  PRN Meds: acetaminophen, albuterol, dextromethorphan-guaiFENesin, hydrALAZINE, nitroGLYCERIN, ondansetron (ZOFRAN) IV, oxyCODONE   Vital Signs    Vitals:   02/17/23 0338 02/17/23 0430 02/17/23 0700 02/17/23 0819  BP:  (!) 118/58  121/64  Pulse:  79 78 76  Resp:  20 19 16   Temp:  98.3 F (36.8 C)  98.3 F (36.8 C)  TempSrc:  Oral  Oral  SpO2:  94% 90% 92%  Weight: (!) 145.8 kg     Height:        Intake/Output Summary (Last 24 hours) at 02/17/2023 1043 Last data filed at 02/17/2023 0546 Gross per 24 hour  Intake --  Output 1175 ml  Net -1175 ml      02/17/2023    3:38 AM 02/16/2023    4:22 AM 02/15/2023    7:02 AM  Last 3 Weights  Weight (lbs) 321 lb 6.9  oz 317 lb 7.4 oz 315 lb 0.6 oz  Weight (kg) 145.8 kg 144 kg 142.9 kg      Telemetry    NSR - Personally Reviewed  ECG    No new - Personally Reviewed  Physical Exam   GEN: No acute distress.   Neck: + JVD Cardiac: RRR, no murmurs, rubs, or gallops.  Respiratory: Clear to auscultation bilaterally. GI: +distended  MS: 2+ lower leg edema; No deformity. Neuro:  Nonfocal  Psych: Normal affect   Labs    High Sensitivity Troponin:   Recent Labs  Lab 02/14/23 2152 02/15/23 0205 02/15/23 0653 02/15/23 1058 02/15/23 1859  TROPONINIHS 550* 535* 541* 458* 314*     Chemistry Recent Labs  Lab 02/15/23 0205 02/16/23 1039 02/17/23 0439  NA 137 137 138  K 4.4 5.1 5.4*  CL 103 100 100  CO2 25 28 29   GLUCOSE 111* 199* 149*  BUN 61* 68* 70*  CREATININE 2.79* 3.03* 3.19*  CALCIUM 8.2* 8.5* 8.9  MG 2.2  --  2.3  GFRNONAA 23* 21* 19*  ANIONGAP 9 9 9  Lipids  Recent Labs  Lab 02/15/23 0205  CHOL 131  TRIG 123  HDL 31*  LDLCALC 75  CHOLHDL 4.2    Hematology Recent Labs  Lab 02/15/23 0205 02/16/23 0452 02/17/23 0439  WBC 7.5 7.3 8.6  RBC 4.10* 4.08* 4.24  HGB 11.6* 11.6* 12.0*  HCT 37.8* 38.0* 39.0  MCV 92.2 93.1 92.0  MCH 28.3 28.4 28.3  MCHC 30.7 30.5 30.8  RDW 17.2* 17.2* 17.1*  PLT 193 183 206   Thyroid No results for input(s): "TSH", "FREET4" in the last 168 hours.  BNP Recent Labs  Lab 02/14/23 1642  BNP 627.9*    DDimer No results for input(s): "DDIMER" in the last 168 hours.   Radiology    ECHOCARDIOGRAM COMPLETE  Result Date: 02/15/2023    ECHOCARDIOGRAM REPORT   Patient Name:   Tony Williams Date of Exam: 02/15/2023 Medical Rec #:  [id number]        Height:       70.0 in Accession #:    [id number]       Weight:       315.0 lb Date of Birth:  1946-02-02        BSA:          2.532 m Patient Age:    77 years         BP:           112/50 mmHg Patient Gender: M                HR:           70 bpm. Exam Location:  [facility] Procedure: 2D Echo,  Cardiac Doppler, Color Doppler and Intracardiac            Opacification Agent Indications:     CHF  History:         Patient has prior history of Echocardiogram examinations, most                  recent 10/29/2021. CHF, CAD, Previous Myocardial Infarction and                  Angina, Prior CABG, COPD, Arrythmias:Bradycardia,                  Signs/Symptoms:Dizziness/Lightheadedness and Dyspnea; Risk                  Factors:Hypertension, Diabetes, Dyslipidemia, Sleep Apnea and                  Former Smoker. CKD.  Sonographer:     Mikki Harbor Referring Phys:  [id number] Lorretta Harp Diagnosing Phys: Yvonne Kendall MD  Sonographer Comments: Technically difficult study due to poor echo windows. Image acquisition challenging due to patient body habitus. IMPRESSIONS  1. Left ventricular ejection fraction, by estimation, is 30 to 35%. The left ventricle has moderately decreased function. Left ventricular endocardial border not optimally defined to evaluate regional wall motion. The left ventricular internal cavity size was mildly to moderately dilated. There is mild left ventricular hypertrophy. Left ventricular diastolic parameters are consistent with Grade II diastolic dysfunction (pseudonormalization). Elevated left atrial pressure.  2. Right ventricular systolic function was not well visualized. The right ventricular size is not well visualized.  3. Left atrial size was mildly dilated.  4. Right atrial size was moderately dilated.  5. The mitral valve was not well visualized. No evidence of mitral valve regurgitation. No evidence of mitral stenosis.  6. The aortic valve has  an indeterminant number of cusps. Aortic valve regurgitation is trivial.  7. Pulmonic valve regurgitation not well-assessed. FINDINGS  Left Ventricle: Left ventricular ejection fraction, by estimation, is 30 to 35%. The left ventricle has moderately decreased function. Left ventricular endocardial border not optimally defined to evaluate regional  wall motion. Definity contrast agent was given IV to delineate the left ventricular endocardial borders. The left ventricular internal cavity size was mildly to moderately dilated. There is mild left ventricular hypertrophy. Left ventricular diastolic parameters are consistent with Grade II  diastolic dysfunction (pseudonormalization). Elevated left atrial pressure. Right Ventricle: The right ventricular size is not well visualized. Right vetricular wall thickness was not well visualized. Right ventricular systolic function was not well visualized. Left Atrium: Left atrial size was mildly dilated. Right Atrium: Right atrial size was moderately dilated. Pericardium: There is no evidence of pericardial effusion. Mitral Valve: The mitral valve was not well visualized. There is mild thickening of the mitral valve leaflet(s). No evidence of mitral valve regurgitation. No evidence of mitral valve stenosis. MV peak gradient, 7.1 mmHg. The mean mitral valve gradient is 2.0 mmHg. Tricuspid Valve: The tricuspid valve is not well visualized. Tricuspid valve regurgitation is trivial. Aortic Valve: The aortic valve has an indeterminant number of cusps. Aortic valve regurgitation is trivial. Aortic valve mean gradient measures 5.0 mmHg. Aortic valve peak gradient measures 9.5 mmHg. Aortic valve area, by VTI measures 2.60 cm. Pulmonic Valve: The pulmonic valve was not well visualized. Pulmonic valve regurgitation not well-assessed. Aorta: The aortic root is normal in size and structure. Pulmonary Artery: The pulmonary artery is not well seen. Venous: The inferior vena cava was not well visualized. IAS/Shunts: The interatrial septum was not well visualized.  LEFT VENTRICLE PLAX 2D LVIDd:         6.10 cm   Diastology LVIDs:         5.40 cm   LV e' medial:    5.87 cm/s LV PW:         1.10 cm   LV E/e' medial:  19.6 LV IVS:        1.30 cm   LV e' lateral:   6.74 cm/s LVOT diam:     2.20 cm   LV E/e' lateral: 17.1 LV SV:         92 LV  SV Index:   36 LVOT Area:     3.80 cm  LEFT ATRIUM             Index        RIGHT ATRIUM           Index LA diam:        5.90 cm 2.33 cm/m   RA Area:     28.50 cm LA Vol (A2C):   91.6 ml 36.18 ml/m  RA Volume:   113.00 ml 44.63 ml/m LA Vol (A4C):   86.9 ml 34.32 ml/m LA Biplane Vol: 90.1 ml 35.59 ml/m  AORTIC VALVE AV Area (Vmax):    2.84 cm AV Area (Vmean):   2.47 cm AV Area (VTI):     2.60 cm AV Vmax:           154.00 cm/s AV Vmean:          102.000 cm/s AV VTI:            0.352 m AV Peak Grad:      9.5 mmHg AV Mean Grad:      5.0 mmHg LVOT Vmax:  115.00 cm/s LVOT Vmean:        66.200 cm/s LVOT VTI:          0.241 m LVOT/AV VTI ratio: 0.68  AORTA Ao Root diam: 3.70 cm Ao Asc diam:  3.00 cm MITRAL VALVE                TRICUSPID VALVE MV Area (PHT): 2.94 cm     TR Peak grad:   21.0 mmHg MV Area VTI:   2.07 cm     TR Vmax:        229.00 cm/s MV Peak grad:  7.1 mmHg MV Mean grad:  2.0 mmHg     SHUNTS MV Vmax:       1.33 m/s     Systemic VTI:  0.24 m MV Vmean:      70.0 cm/s    Systemic Diam: 2.20 cm MV Decel Time: 258 msec MV E velocity: 115.00 cm/s MV A velocity: 106.00 cm/s MV E/A ratio:  1.08 Yvonne Kendall MD Electronically signed by Yvonne Kendall MD Signature Date/Time: 02/15/2023/5:29:17 PM    Final     Cardiac Studies   Echo  1. Left ventricular ejection fraction, by estimation, is 30 to 35%. The  left ventricle has moderately decreased function. Left ventricular  endocardial border not optimally defined to evaluate regional wall motion.  The left ventricular internal cavity  size was mildly to moderately dilated. There is mild left ventricular  hypertrophy. Left ventricular diastolic parameters are consistent with  Grade II diastolic dysfunction (pseudonormalization). Elevated left atrial  pressure.   2. Right ventricular systolic function was not well visualized. The right  ventricular size is not well visualized.   3. Left atrial size was mildly dilated.   4. Right atrial  size was moderately dilated.   5. The mitral valve was not well visualized. No evidence of mitral valve  regurgitation. No evidence of mitral stenosis.   6. The aortic valve has an indeterminant number of cusps. Aortic valve  regurgitation is trivial.   7. Pulmonic valve regurgitation not well-assessed.   Patient Profile     77 y.o. male with history of CAD status post CABG and subsequent PCI's, chronic HFpEF, hypertension, hyperlipidemia, type 2 diabetes mellitus, statin intolerance, COPD, CKD stage III, and obstructive sleep apnea, whom we have been asked to see due to acute on chronic heart failure who is being seen for 3-week history of progressive lower extremity edema and shortness of breath associated with a 20 pound weight gain   Assessment & Plan    Acute on chronic diastolic and systolic heart failure - presented with weight gain, swelling and SOB - BNP 600 - Echo this admission with reduced LVEF 30-35% - echo in 2023 showed LVEF 50-55%, G1DD - IV lasix 40mg  BID - he has had OK UOP, difficulty voiding s/p cath - lasix was held 12/4 for worsening renal function - kidney function worse today - still volume up on exam, start IV lasix 80mg  daily. May need milrinone to aid in diuresis.   CAD s/p CABG Elevated troponin - no chest pain reported - HS troponin 600s and down trending - IV heparin x 48 hours completed - LVEF low as above - no immediate plans for cath given renal function - continue Aspirin, Plavix, Imdur and statin  Acute on chronic CKD stage 3 - baseline Scr 2.2 - Scr 2.79 on presentation - scr up to3 and lasix was held - may benefit from nephrology consult  OSA - continue CPAP use  COPD - he uses 3L O2 at baseline  For questions or updates, please contact [facility] Please consult [email or web address] for contact info under        Signed, Ramyah Pankowski David Stall, PA-C  02/17/2023, 10:43 AM

## 2023-02-17 NOTE — Progress Notes (Signed)
Heart Failure Stewardship Pharmacy Note  PCP: Erasmo Downer, MD PCP-Cardiologist: Debbe Odea, MD  HPI: Tony Williams is a 77 y.o. male with dCHF, HLD, HTN, DM, COPD on 3L oxygen, CAD (s/p of CABG and stent), gout, CKD-3b, depression with anxiety, morbid obesity, OSA, former smoker, bladder cancer who presented with worsening shortness of breath. BNP on admission was 627.9. Troponin on admission was 609 and trended down over time. CXR showed vascular congestion and mild interstitial edema. Has had issues this admission with urinary retention requiring catheterization.   Pertinent cardiac history: Distant history of CABG. Normal LVEF in 2022. Underwent high risk PCI to SVG-D2-LAD with IABP assistance in 10/2020. Normal LVEF in 10/2021. Echocardiogram this admission showed LVEF of 30-35% and grade II diastolic dysfunction.   Pertinent Lab Values: Creatinine, Ser  Date Value Ref Range Status  02/17/2023 3.19 (H) 0.61 - 1.24 mg/dL Final   BUN  Date Value Ref Range Status  02/17/2023 70 (H) 8 - 23 mg/dL Final  16/12/9602 40 (H) 8 - 27 mg/dL Final   Potassium  Date Value Ref Range Status  02/17/2023 5.4 (H) 3.5 - 5.1 mmol/L Final   Sodium  Date Value Ref Range Status  02/17/2023 138 135 - 145 mmol/L Final  08/10/2022 141 134 - 144 mmol/L Final   B Natriuretic Peptide  Date Value Ref Range Status  02/14/2023 627.9 (H) 0.0 - 100.0 pg/mL Final    Comment:    Performed at [facility], [address] [place], [place] [address]   Magnesium  Date Value Ref Range Status  02/17/2023 2.3 1.7 - 2.4 mg/dL Final    Comment:    Performed at [facility], [address] [place], [place] [address]   Hgb A1c MFr Bld  Date Value Ref Range Status  02/15/2023 6.8 (H) 4.8 - 5.6 % Final    Comment:    (NOTE) Pre diabetes:          5.7%-6.4%  Diabetes:              >6.4%  Glycemic control for   <7.0% adults with diabetes    TSH  Date Value Ref Range  Status  12/08/2020 2.515 0.350 - 4.500 uIU/mL Final    Comment:    Performed by a 3rd Generation assay with a functional sensitivity of <=0.01 uIU/mL. Performed at [facility], [address] [place], [place] [address]   04/22/2020 4.100 0.450 - 4.500 uIU/mL Final    Vital Signs: Admission weight: 315 lbs Temp:  [97.6 F (36.4 C)-98.3 F (36.8 C)] 98.3 F (36.8 C) (12/05 0819) Pulse Rate:  [64-79] 76 (12/05 0819) Cardiac Rhythm: Normal sinus rhythm (12/05 0700) Resp:  [16-20] 16 (12/05 0819) BP: (108-121)/(50-64) 121/64 (12/05 0819) SpO2:  [62 %-96 %] 92 % (12/05 0819) Weight:  [145.8 kg (321 lb 6.9 oz)] 145.8 kg (321 lb 6.9 oz) (12/05 0338)  Intake/Output Summary (Last 24 hours) at 02/17/2023 1140 Last data filed at 02/17/2023 0546 Gross per 24 hour  Intake --  Output 475 ml  Net -475 ml    Current Heart Failure Medications:  Loop diuretic: furosemide 80 mg IV BID Beta-Blocker: none ACEI/ARB/ARNI: none MRA: none Other vasoactive medications include amlodipine 10 mg daily, Imdur 30 mg daily   Prior to admission Heart Failure Medications:  Loop diuretic: torsemide 20 mg daily + metolazone 2.5 mg on Wednesday Beta-Blocker: carvedilol 3.125 mg bid ACEI/ARB/ARNI: none MRA: none SGLT2i: none Other vasoactive medications include amlodipine 10  mg daily, clonidine 0.1 mg daily, Imdur 30 mg daily, hydralazine 100 mg TID  Assessment: 1. Acute combined systolic and diastolic heart failure (LVEF 30-35%) with grade II diastolic dysfunction, due to ICM. NYHA class IV symptoms.  -Symptoms: Reports shortness of breath at rest, worsening since yesterday. LEE is worse from yesterday. Reports orthopnea. Appetite is worse. Face today had less color, but apparently was not receiving his oxygen at the time. Cardiology is checking lactic acid. -Volume: Appears volume overloaded after furosemide was held yesterday. Creatinine is trending up. Will need aggressive diuresis  today. -Hemodynamics: BP is stable 100-120s/50-60s. Agree with stopping clonidine and holding hydralazine for now. -BB: Carvedilol help while overtly hypervolemic and some concern for low output. -ACEI/ARB/ARNI: Not ideal with AKI on CKD. -MRA: Contraindicated due to creatinine.  -SGLT2i: Body habitus and low mobility may increase risk of ADEs. Will continue to follow.  Plan: 1) Medication changes recommended at this time: -None, agree with restarting furosemide and holding carvedilol.   2) Patient assistance: -Copays for branded HF medications are $45  3) Education: - Patient has been educated on current HF medications and potential additions to HF medication regimen - Patient verbalizes understanding that over the next few months, these medication doses may change and more medications may be added to optimize HF regimen - Patient has been educated on basic disease state pathophysiology and goals of therapy  Medication Assistance / Insurance Benefits Check: Does the patient have prescription insurance?    Type of insurance plan:  Does the patient qualify for medication assistance through manufacturers or grants? Pending   Outpatient Pharmacy: Prior to admission outpatient pharmacy: [facility]     Please do not hesitate to reach out with questions or concerns,  Enos Fling, PharmD, CPP, BCPS Heart Failure Pharmacist  Phone - [phone] 02/17/2023 11:40 AM

## 2023-02-17 NOTE — Progress Notes (Signed)
PROGRESS NOTE    Tony Williams  WUJ:[id number] DOB: 16-May-1945 DOA: 02/14/2023 PCP: Erasmo Downer, MD  [id number]  LOS: 3 days   Brief hospital course:   Assessment & Plan: 77 y.o. male with medical history significant of dCHF, HLD, HTN, DM, COPD on 3L oxygen, CAD (s/p of CABG and stent), gout, CKD-3b, depression with anxiety, morbid obesity, OSA, former smoker, bilateral cancer, who presents with SOB.   Patient states that he has shortness of breath in the past several weeks which has been progressively worsening.  Patient is dry cough, no fever or chills.  He had mild substernal chest pain earlier, which has resolved. Patient also has worsening bilateral lower leg edema and 15 pounds of weight gain recently.  Patient has nausea, no vomiting, diarrhea or abdominal pain.  No symptoms of UTI.  Patient states he has intermittent bilateral blurry vision for more than 3 weeks.  He reports mild numbness in both feet, and generalized weakness.  No unilateral weakness in extremities.  No facial droop or slurred speech.   Patient is normally using 3 L oxygen at baseline, but found to have acute respiratory distress, with oxygen desaturation to 82% and difficulty speaking in full sentence, currently requiring 6 L oxygen with 93% of saturation.   Acute on chronic respiratory failure with hypoxia due to acute on chronic diastolic CHF (congestive heart failure) (HCC): 2D echo on 10/29/2021 showed EF 50-55% with grade 1 diastolic dysfunction.  Patient has 3+ leg edema, 15 pounds weight gain, elevated BNP 627, clinically consistent with CHF exacerbation. --drop in ejection fraction down to 30% on echo down from 50% in 2023  --cardio consulted Plan: --IV lasix 80 BID today, per cardio --metolazone 5 mg x1 today   Hx of CAD (coronary artery disease)  Trop elevation 2/2 demand ischemia --trop 609. Pt had mild chest pain earlier, which has resolved.  Patient is s/p of CABG and stent placement. -  IV heparin is started by EDP Plan: --Per cardio, No immediate plans for cardiac catheterization given worsening renal dysfunction.  Ideally needs cardiac catheterization though will have to wait pending renal function improvement.  Urinary retention --had bladder sphincter that urology had turned off, but still had retention.  Foley inserted on 12/4 with immediate return of 700 ml. --cont oxybutynin for bladder discomfort   Chronic obstructive pulmonary disease on 3L home O2 No evidence of acute exacerbation   Blurry vision, bilateral: Etiology is not clear. It has been going on for more than 3 weeks intermittently.  No unilateral weakness in extremities.  No facial droop or slurred speech.  I offered to do MRI, but the patient has claustrophobia, need open MRI.  Patient states that he wants to see eye doctor first before doing MRI. -Will get CT of head --> negative for acute intracranial abnormalities. Plan: -Patient is on aspirin and statin -Fall precaution   Hypertension associated with diabetes (HCC) -IV hydralazine as needed -Patient is on IV Lasix --cont Imdur --hold coreg, amlodipine and hydralazine due to hypotension   Hyperlipidemia associated with type 2 diabetes mellitus (HCC) --cont statin as pravastatin   Type II diabetes mellitus with renal manifestations (HCC):  Recent A1c 6.3, well-controlled.  Patient is taking glipizide at home. --SSI for now   AKI Stage 3b chronic kidney disease (CKD) (HCC):  --Cr 2.79 on presentation.  Cr up to 3.41 this morning. -Avoid using renal toxic medications. --monitor Cr while diuresing.     Chronic gout due to renal impairment  without tophus --hold colchicine due to AKI --cont allopurinol   Depression with anxiety --cont Lexapro and trazodone   OSA (obstructive sleep apnea) -CPAP when sleeping   Morbid obesity (HCC): Body weight 142.9 kg, BMI 45.2 -Encourage losing weight -Exercise and healthy diet  Somnolence --noted  to be very sleepy today, due to combination of not using CPAP last night, on home lyrica and gabapentin with doses too high for his kidney function now, and having taken oxycodone. --need to ensure CPAP while sleeping --hold Lyrica and gabapentin for now, resume at lower dose when pt is more alert.    DVT prophylaxis: Heparin SQ Code Status: Full code  Family Communication:  Level of care: Telemetry Cardiac Dispo:   The patient is from: home Anticipated d/c is to: home Anticipated d/c date is: to be determined   Subjective and Interval History:  Pt was very sleepy today, but did wake up to answer yes to whether his bladder felt better.   Objective: Vitals:   02/17/23 0700 02/17/23 0819 02/17/23 1216 02/17/23 1521  BP:  121/64 (!) 116/96 102/83  Pulse: 78 76 (!) 54 66  Resp: 19 16 16 18   Temp:  98.3 F (36.8 C) 98.4 F (36.9 C) 98.4 F (36.9 C)  TempSrc:  Oral Oral   SpO2: 90% 92% 92% 94%  Weight:      Height:        Intake/Output Summary (Last 24 hours) at 02/17/2023 1947 Last data filed at 02/17/2023 1720 Gross per 24 hour  Intake --  Output 700 ml  Net -700 ml   Filed Weights   02/15/23 0702 02/16/23 0422 02/17/23 0338  Weight: (!) 142.9 kg (!) 144 kg (!) 145.8 kg    Examination:   Constitutional: NAD, very sleepy HEENT: conjunctivae and lids normal, EOMI CV: No cyanosis.   RESP: normal respiratory effort SKIN: warm, dry Foley present   Data Reviewed: I have personally reviewed labs and imaging studies  Time spent: 50 minutes  Darlin Priestly, MD [facility] If 7PM-7AM, please contact night-coverage 02/17/2023, 7:47 PM

## 2023-02-18 ENCOUNTER — Encounter: Payer: Self-pay | Admitting: Internal Medicine

## 2023-02-18 DIAGNOSIS — I5033 Acute on chronic diastolic (congestive) heart failure: Secondary | ICD-10-CM | POA: Diagnosis not present

## 2023-02-18 DIAGNOSIS — I5021 Acute systolic (congestive) heart failure: Secondary | ICD-10-CM | POA: Diagnosis not present

## 2023-02-18 LAB — GLUCOSE, CAPILLARY
Glucose-Capillary: 130 mg/dL — ABNORMAL HIGH (ref 70–99)
Glucose-Capillary: 140 mg/dL — ABNORMAL HIGH (ref 70–99)
Glucose-Capillary: 117 mg/dL — ABNORMAL HIGH (ref 70–99)
Glucose-Capillary: 143 mg/dL — ABNORMAL HIGH (ref 70–99)

## 2023-02-18 LAB — BASIC METABOLIC PANEL
Anion gap: 9 (ref 5–15)
BUN: 83 mg/dL — ABNORMAL HIGH (ref 8–23)
CO2: 30 mmol/L (ref 22–32)
Calcium: 8.8 mg/dL — ABNORMAL LOW (ref 8.9–10.3)
Chloride: 100 mmol/L (ref 98–111)
Creatinine, Ser: 3.33 mg/dL — ABNORMAL HIGH (ref 0.61–1.24)
GFR, Estimated: 18 mL/min — ABNORMAL LOW (ref >60)
Glucose, Bld: 143 mg/dL — ABNORMAL HIGH (ref 70–99)
Potassium: 4.9 mmol/L (ref 3.5–5.1)
Sodium: 139 mmol/L (ref 135–145)

## 2023-02-18 LAB — AMMONIA: Ammonia: 33 umol/L (ref 9–35)

## 2023-02-18 MED ORDER — DEXTROSE 5 % IV SOLN
160.0000 mg | Freq: Two times a day (BID) | INTRAVENOUS | Status: DC
  Administered 2023-02-18 – 2023-02-21 (×8): 160 mg via INTRAVENOUS
  Filled 2023-02-18 (×10): qty 16

## 2023-02-18 MED ORDER — TRAMADOL HCL 50 MG PO TABS
100.0000 mg | ORAL_TABLET | Freq: Two times a day (BID) | ORAL | Status: DC | PRN
  Administered 2023-02-18 – 2023-02-19 (×2): 100 mg via ORAL
  Filled 2023-02-18 (×2): qty 2

## 2023-02-18 MED ORDER — METOLAZONE 5 MG PO TABS
5.0000 mg | ORAL_TABLET | Freq: Once | ORAL | Status: AC
  Administered 2023-02-18: 5 mg via ORAL
  Filled 2023-02-18: qty 1

## 2023-02-18 MED ORDER — FUROSEMIDE 10 MG/ML IJ SOLN
80.0000 mg | Freq: Once | INTRAMUSCULAR | Status: DC
  Filled 2023-02-18: qty 8

## 2023-02-18 MED ORDER — FUROSEMIDE 10 MG/ML IJ SOLN: 160.0000 mg | Freq: Two times a day (BID) | INTRAVENOUS | Status: DC

## 2023-02-18 NOTE — Consult Note (Signed)
Advanced Heart Failure Team Consult Note   Primary Physician: Erasmo Downer, MD PCP-Cardiologist:  Debbe Odea, MD  Reason for Consultation: Acute systolic HF  HPI:    Tony Williams is seen today for evaluation of acute systolic HF at the request of Dr. Okey Dupre.   77 y.o. male with a history of CAD status post four-vessel bypass in 2007 with subsequent interventions, chronic HFpEF, hypertension, hyperlipidemia, statin intolerance, obesity, diabetes, remote tobacco abuse, COPD, sleep apnea (not using CPAP), and stage IIIB chronic kidney disease;   In 3/03 June 2020 had stress test in setting of CP. -> basal inferior, basal inferolateral, mid inferior, and mid inferolateral defect consistent with infarct and moderate peri-infarct ischemia.  EF was 45%.  This was followed by an echocardiogram which showed an EF 60-65% without regional wall motion abnormalities.  He was medically managed.  In August 2022, he was admitted with angina and ruled in for non-STEMI.  Echo showed an EF of 50 to 55% with apical hypokinesis and grade 2 diastolic dysfunction.  He was volume overloaded and required diuresis with subsequent development of mild acute kidney injury.  He subsequently underwent diagnostic catheterization revealing an occlusion of the previously stented vein graft to the obtuse marginal as well as severe disease in sequential vein graft to the D2 and mid LAD with stenoses prior to insertion of both vessels.  He required placement of intra-aortic balloon pump was transferred to [facility] for high risk PCI, which was performed the following day with 2 drug-eluting stents placed in the sequential vein graft.    Readmitted in 10/2021 w/ c/p and demand ischemia.  Stress testing was moderate risk in the setting of a fixed basal to mid inferior and mid anteroseptal defect.  Echo showed EF of 55% with grade 1 diastolic dysfunction.  He was medically managed.    Over the past 3 wks, he has  been having progressive bilateral lower ext edema and in that setting, has gained about 20 lbs in the past month, 15 of which in the past week.  He has also been having progressive dyspnea at rest and orthopnea, prompting him to sleep in his recliner over the past week.  He has had occasional, fleeting c/p, different from his prior angina, which he calls "twinges."  He has had some unsteadiness and dizziness, which he attributes to severe L knee pain and swelling, for which he was pending an injection.  Due to progressive dyspnea, orthopnea, and swelling, he presented to the ED on 12/2.  Here, he was afebrile and normotensive.  ECG showed RSR, 68, septal infart, inflat ST depression, IVCD - no acute changes.  Lab work notable for elevated BUN/creatinine above baseline at 61/2.79.  BNP of 627.9, troponin of 609  550  535  541  458.    He was started on IV lasix with only moderate response so the HF team was asked to see. Has been quite sedentary for several years. Mostly sits in recliner and gets around the house. Also struggling with severe L knee OSA. Falls asleep quickly during exam. Wife and daughter at bedside and helpful providing history.      Home Medications Prior to Admission medications   Medication Sig Start Date End Date Taking? Authorizing Provider  acetaminophen (TYLENOL) 325 MG tablet Take 2 tablets (650 mg total) by mouth every 6 (six) hours as needed for mild pain (or Fever >/= 101). 11/12/21  Yes Rodolph Bong, MD  albuterol (PROVENTIL) (  2.5 MG/3ML) 0.083% nebulizer solution Take 3 mLs (2.5 mg total) by nebulization every 6 (six) hours as needed for wheezing or shortness of breath. 08/04/22  Yes Coralyn Helling, MD  albuterol (VENTOLIN HFA) 108 (90 Base) MCG/ACT inhaler INHALE 2 PUFFS INTO THE LUNGS EVERY 6 HOURS AS NEEDED FOR WHEEZING OR SHORTNESS OF BREATH 01/08/22  Yes Bacigalupo, Marzella Schlein, MD  allopurinol (ZYLOPRIM) 300 MG tablet Take 1 tablet (300 mg total) by mouth daily. 08/10/22   Yes Bacigalupo, Marzella Schlein, MD  amLODipine (NORVASC) 10 MG tablet TAKE 1 TABLET(10 MG) BY MOUTH DAILY 09/20/22  Yes Bacigalupo, Marzella Schlein, MD  aspirin EC 81 MG tablet Take 81 mg by mouth daily. Swallow whole.   Yes [provider]  Budeson-Glycopyrrol-Formoterol (BREZTRI AEROSPHERE) 160-9-4.8 MCG/ACT AERO Inhale 2 puffs into the lungs 2 (two) times daily. 01/27/21  Yes Parrett, Tammy S, NP  carvedilol (COREG) 3.125 MG tablet Take 3.125 mg by mouth 2 (two) times daily. 11/18/22  Yes [provider]  Cholecalciferol (VITAMIN D3 PO) Take 4,000 Units by mouth.   Yes [provider]  cloNIDine (CATAPRES) 0.1 MG tablet Take 0.1 mg by mouth daily.   Yes [provider]  clopidogrel (PLAVIX) 75 MG tablet TAKE 1 TABLET(75 MG) BY MOUTH DAILY 01/17/23  Yes Bacigalupo, Marzella Schlein, MD  Coenzyme Q10 (CO Q-10) 400 MG CAPS Take 400 mg by mouth daily at 6 (six) AM.   Yes [provider]  colchicine 0.6 MG tablet Take 1 tablet (0.6 mg total) by mouth 2 (two) times daily. 05/31/22  Yes Merita Norton T, FNP  desoximetasone (TOPICORT) 0.25 % cream APPLY TOPICALLY TO THE AFFECTED AREA TWICE DAILY 09/21/22  Yes Bacigalupo, Marzella Schlein, MD  escitalopram (LEXAPRO) 20 MG tablet TAKE 1 TABLET(20 MG) BY MOUTH DAILY 01/21/23  Yes Bacigalupo, Marzella Schlein, MD  fenofibrate (TRICOR) 145 MG tablet Take 1 tablet (145 mg total) by mouth daily. 07/24/21  Yes Agbor-Etang, Arlys John, MD  gabapentin (NEURONTIN) 300 MG capsule Take 300 mg by mouth 3 (three) times daily.   Yes [provider]  glimepiride (AMARYL) 2 MG tablet Take 1 tablet (2 mg total) by mouth daily with breakfast. TAKE 1 TABLET(1 MG) BY MOUTH DAILY WITH BREAKFAST 08/10/22  Yes Bacigalupo, Marzella Schlein, MD  hydrALAZINE (APRESOLINE) 100 MG tablet TAKE 1 TABLET(100 MG) BY MOUTH THREE TIMES DAILY 01/21/23  Yes Bacigalupo, Marzella Schlein, MD  HYDROcodone-acetaminophen (NORCO/VICODIN) 5-325 MG tablet Take 1 tablet by mouth every 6 (six) hours as needed for  moderate pain (pain score 4-6). 01/31/23  Yes Bacigalupo, Marzella Schlein, MD  isosorbide mononitrate (IMDUR) 30 MG 24 hr tablet Take 1 tablet (30 mg total) by mouth daily. 06/29/22  Yes Furth, Cadence H, PA-C  loratadine (CLARITIN) 10 MG tablet Take 1 tablet (10 mg total) by mouth daily. 11/12/21  Yes Rodolph Bong, MD  lovastatin (MEVACOR) 40 MG tablet TAKE 1 TABLET(40 MG) BY MOUTH DAILY 01/21/23  Yes Bacigalupo, Marzella Schlein, MD  metolazone (ZAROXOLYN) 2.5 MG tablet Take 2.5 mg by mouth every Wednesday. 12/21/22 12/21/23 Yes [provider]  nitroGLYCERIN (NITROSTAT) 0.4 MG SL tablet Place 1 tablet (0.4 mg total) under the tongue every 5 (five) minutes as needed for chest pain. 10/26/21 02/14/23 Yes Agbor-Etang, Arlys John, MD  Omega-3 Fatty Acids (FISH OIL) 1200 MG CAPS Take 1,200 mg by mouth daily at 6 (six) AM.   Yes [provider]  pregabalin (LYRICA) 100 MG capsule Take 1 capsule (100 mg total) by mouth 2 (  two) times daily. 11/11/22  Yes Bacigalupo, Marzella Schlein, MD  torsemide (DEMADEX) 20 MG tablet Take 1 tablet (20 mg total) by mouth daily. 12/15/22  Yes Agbor-Etang, Arlys John, MD  traZODone (DESYREL) 150 MG tablet TAKE 1 TABLET(150 MG) BY MOUTH AT BEDTIME 12/15/22  Yes Bacigalupo, Marzella Schlein, MD  oxyCODONE (ROXICODONE) 5 MG immediate release tablet Take 1 tablet (5 mg total) by mouth every 8 (eight) hours as needed. Patient not taking: Reported on 02/14/2023 02/05/23 02/05/24  Poggi, Juliane Lack    Past Medical History: Past Medical History:  Diagnosis Date   Bladder cancer [facility]) 2013   CAD (coronary artery disease)    a. 2007 CABG x 4: LIMA->D1, VG->OM1, VG->D2->mLAD; b. 2018 NSTEMI/PCI: DES to VG->OM; c. 10/2020 NSTEMI/PCI: LM 90d, LAD 163m, D1 100, LCX 100ost/p, RCA sev diff dzs, VG->D2->mLAD 99/90 before D2 (4.0x15 Onyx Frontier DES), 90 before mLAD (4.0x15 Onyx Frontier DES), LIMA->D1 ok, VG->OM1 100; d. 10/2021 MV: no isch. mod basal to mid inf and antsept infarct.   Chronic heart failure  with preserved ejection fraction (HFpEF) (HCC)    a. 10/2020 Echo: EF 50-55%, apical HK, GrII DD, mod dil LA; b. 11/2020 Echo: EF 60-65%, no rwma; c. 10/2021 Echo: EF 50-55%, inf HK, GrI DD, nl RV fxn, mildly dil LA, mild MR.   Chronic kidney disease    COPD (chronic obstructive pulmonary disease) (HCC)    Depression    Diabetes mellitus without complication (HCC)    Hx of CABG x 4    LIMA-D2, SVG-OM (occluded), SeqSVG-D1-LAD.   Labile hypertension    Myocardial infarction [facility])     Past Surgical History: Past Surgical History:  Procedure Laterality Date   CHOLECYSTECTOMY, LAPAROSCOPIC  12/26/1995   COLONOSCOPY WITH PROPOFOL N/A 09/23/2020   Procedure: COLONOSCOPY WITH PROPOFOL;  Surgeon: Midge Minium, MD;  Location: [facility] ENDOSCOPY;  Service: Endoscopy;  Laterality: N/A;   CORONARY ARTERY BYPASS GRAFT     LIMA-D2, SVG-OM, SeqSVG-D1-LAD   CORONARY STENT INTERVENTION N/A 11/12/2020   Procedure: CORONARY STENT INTERVENTION;  Surgeon: Iran Ouch, MD;  Location: [facility] INVASIVE CV LAB;  Service: Cardiovascular;  Laterality: N/A;   EYE SURGERY     IABP INSERTION N/A 11/11/2020   Procedure: IABP Insertion;  Surgeon: Yvonne Kendall, MD;  Location: [facility] INVASIVE CV LAB;  Service: Cardiovascular;  Laterality: N/A;   IR STENT PLACEMENT ANTE CAROTID INC ANGIO     KNEE SURGERY     RIGHT/LEFT HEART CATH AND CORONARY/GRAFT ANGIOGRAPHY N/A 11/11/2020   Procedure: RIGHT/LEFT HEART CATH AND CORONARY/GRAFT ANGIOGRAPHY;  Surgeon: Yvonne Kendall, MD;  Location: [facility] INVASIVE CV LAB;  Service: Cardiovascular;  Severe native CAD: 90% dLMCA-100% mLAD/100% ost LCx CTO w/ Severe diffuse RCA disease. Widely patent LIMA-D1. Patent sequential SVG-D2-LAD with 99% mid graft & 90% LIMA-D2 @ anastomosis. previously stented SVG-OM - CTO. Mildly   TRANSURETHRAL RESECTION OF PROSTATE  1994   URINARY SPHINCTER IMPLANT  1995    Family History: Family History  Problem Relation Age of Onset   Heart Problems Mother     Heart Problems Father     Social History: Social History   Socioeconomic History   Marital status: Married    Spouse name: Not on file   Number of children: 2   Years of education: Not on file   Highest education level: High school graduate  Occupational History   Occupation: retired  Tobacco Use   Smoking status: Former    Current packs/day: 0.00  Average packs/day: 1 pack/day for 30.0 years (30.0 ttl pk-yrs)    Types: Cigarettes    Start date: 10/22/1973    Quit date: 10/23/2003    Years since quitting: 19.3   Smokeless tobacco: Never  Vaping Use   Vaping status: Never Used  Substance and Sexual Activity   Alcohol use: Never   Drug use: Never   Sexual activity: Not Currently  Other Topics Concern   Not on file  Social History Narrative   Not on file   Social Determinants of Health   Financial Resource Strain: Low Risk  (02/18/2023)   Overall Financial Resource Strain (CARDIA)    Difficulty of Paying Living Expenses: Not hard at all  Food Insecurity: No Food Insecurity (02/15/2023)   Hunger Vital Sign    Worried About Running Out of Food in the Last Year: Never true    Ran Out of Food in the Last Year: Never true  Transportation Needs: No Transportation Needs (02/18/2023)   PRAPARE - Administrator, Civil Service (Medical): No    Lack of Transportation (Non-Medical): No  Physical Activity: Inactive (05/19/2021)   Exercise Vital Sign    Days of Exercise per Week: 0 days    Minutes of Exercise per Session: 0 min  Stress: No Stress Concern Present (05/19/2021)   [employer] of Occupational Health - Occupational Stress Questionnaire    Feeling of Stress : Only a little  Recent Concern: Stress - Stress Concern Present (02/26/2021)   [employer] of Occupational Health - Occupational Stress Questionnaire    Feeling of Stress : To some extent  Social Connections: Unknown (05/19/2021)   Social Connection and Isolation Panel [NHANES]    Frequency of  Communication with Friends and Family: Not on file    Frequency of Social Gatherings with Friends and Family: Twice a week    Attends Religious Services: More than 4 times per year    Active Member of [employer] or Organizations: No    Attends Banker Meetings: Never    Marital Status: Married    Allergies:  Allergies  Allergen Reactions   Dilaudid [Hydromorphone] Hives    Rash and itch   Butorphanol Itching   Morphine Other (See Comments)    Redness around IV site   Pedi-Pre Tape Spray [Wound Dressing Adhesive] Itching   Statins Other (See Comments)    "Destroyed my muscles"   Zocor [Simvastatin] Other (See Comments)    "Destroyed my muscles"    Objective:    Vital Signs:   Temp:  [98.1 F (36.7 C)-98.9 F (37.2 C)] 98.1 F (36.7 C) (12/06 0803) Pulse Rate:  [54-67] 67 (12/06 0803) Resp:  [15-20] 15 (12/06 0803) BP: (102-130)/(60-96) 120/67 (12/06 0803) SpO2:  [92 %-95 %] 95 % (12/06 0803) Weight:  [142.8 kg] 142.8 kg (12/06 0600) Last BM Date : 02/14/23  Weight change: Filed Weights   02/16/23 0422 02/17/23 0338 02/18/23 0600  Weight: (!) 144 kg (!) 145.8 kg (!) 142.8 kg    Intake/Output:   Intake/Output Summary (Last 24 hours) at 02/18/2023 1113 Last data filed at 02/18/2023 0649 Gross per 24 hour  Intake 357 ml  Output 1450 ml  Net -1093 ml      Physical Exam    General:  Obese male sitting up in be wearing O2 Falls asleep during interview  No resp difficulty HEENT: normal Neck: supple. JVP to jaw . Carotids 2+ bilat; no bruits. No lymphadenopathy or thyromegaly appreciated. ZOX:WRUEAVW rate &  rhythm. No rubs, gallops or murmurs. Lungs: crackles at bases Abdomen: obese soft, nontender, nondistended.  No bruits or masses. Good bowel sounds. Extremities: no cyanosis, clubbing, rash, 2-3+ edema Neuro: alert & orientedx3, cranial nerves grossly intact. moves all 4 extremities w/o difficulty. Affect pleasant   Telemetry   Sinus 70s Personally  reviewed  EKG     Sinus 68 IVCD non-specific STTs Personally reviewed   Labs   Basic Metabolic Panel: Recent Labs  Lab 02/15/23 0205 02/16/23 1039 02/17/23 0439 02/17/23 1604 02/18/23 0436  NA 137 137 138 138 139  K 4.4 5.1 5.4* 5.1 4.9  CL 103 100 100 100 100  CO2 25 28 29 29 30   GLUCOSE 111* 199* 149* 125* 143*  BUN 61* 68* 70* 77* 83*  CREATININE 2.79* 3.03* 3.19* 3.41* 3.33*  CALCIUM 8.2* 8.5* 8.9 8.6* 8.8*  MG 2.2  --  2.3  --   --     Liver Function Tests: No results for input(s): "AST", "ALT", "ALKPHOS", "BILITOT", "PROT", "ALBUMIN" in the last 168 hours. No results for input(s): "LIPASE", "AMYLASE" in the last 168 hours. Recent Labs  Lab 02/18/23 0946  AMMONIA 33    CBC: Recent Labs  Lab 02/14/23 1642 02/15/23 0205 02/16/23 0452 02/17/23 0439  WBC 9.0 7.5 7.3 8.6  HGB 13.3 11.6* 11.6* 12.0*  HCT 43.1 37.8* 38.0* 39.0  MCV 92.3 92.2 93.1 92.0  PLT 228 193 183 206    Cardiac Enzymes: No results for input(s): "CKTOTAL", "CKMB", "CKMBINDEX", "TROPONINI" in the last 168 hours.  BNP: BNP (last 3 results) Recent Labs    02/14/23 1642  BNP 627.9*    ProBNP (last 3 results) No results for input(s): "PROBNP" in the last 8760 hours.   CBG: Recent Labs  Lab 02/17/23 0815 02/17/23 1215 02/17/23 1627 02/17/23 2129 02/18/23 0759  GLUCAP 144* 155* 117* 125* 130*    Coagulation Studies: No results for input(s): "LABPROT", "INR" in the last 72 hours.   Imaging   US RENAL  Result Date: 02/17/2023 CLINICAL DATA:  Acute renal insufficiency. EXAM: RENAL / URINARY TRACT ULTRASOUND COMPLETE COMPARISON:  CT abdomen pelvis dated 08/31/2022. FINDINGS: Evaluation is very limited due to body habitus and anasarca. Right Kidney: Renal measurements: 14.8 x 5.4 x 6.2 cm = volume: 258 mL. Mild parenchyma atrophy. Normal echogenicity. No hydronephrosis or shadowing stone. Left Kidney: Renal measurements: 12.8 x 7.1 x 7.0 cm = volume: 331 mL. Moderate  parenchyma atrophy. Normal echogenicity. Several cystic lesions measure up to 4.1 cm. Evaluation is limited due to body habitus and technique. There is no hydronephrosis or shadowing stone. Bladder: The urinary bladder is decompressed and not visualized. Other: None. IMPRESSION: 1. Mild to moderate bilateral renal parenchyma atrophy. No hydronephrosis or shadowing stone. 2. Suboptimal evaluation of left renal cysts. Electronically Signed   By: Elgie Collard M.D.   On: 02/17/2023 15:39     Medications:     Current Medications:  allopurinol  300 mg Oral Daily   aspirin EC  81 mg Oral Daily   Chlorhexidine Gluconate Cloth  6 each Topical Daily   cholecalciferol  4,000 Units Oral Daily   clopidogrel  75 mg Oral Daily   escitalopram  20 mg Oral Daily   ezetimibe  10 mg Oral Daily   heparin injection (subcutaneous)  5,000 Units Subcutaneous Q8H   insulin aspart  0-5 Units Subcutaneous QHS   insulin aspart  0-9 Units Subcutaneous TID WC   ipratropium-albuterol  3 mL Nebulization  BID   isosorbide mononitrate  30 mg Oral Daily   lidocaine  2 patch Transdermal Q24H   loratadine  10 mg Oral Daily   oxybutynin  5 mg Oral TID   pravastatin  40 mg Oral q1800   traZODone  150 mg Oral QHS    Infusions:  furosemide        Assessment/Plan   1. Acute systolic HF - Echo this admit. Difficult images EF ~35% (not well seeN) RV not seen well but does not appear to be significantly dilated - NYHA IV (multifactorial) - Volume overloaded - Unclear if volume overloaded due primarily to HF or worsening renal function or both - Will push diuresis with increasing lasix to 160IV bid and add metolazone 5  - If no response and/or Scr worsening may need to consider Renal evaluation - Ideally would warrant R/L cath but renal dysfunction prohibitive for coronary angiography. Can consider Lexiscan Myoview prior to d/c - No ACE/ARB/ARNi or MRA with AKI/CKD - Eventual b-blocker - Possible SGLT2i depending on  renal function   2. CAD  - s/p previous PCIs and CABG - has had occasional CPs. No evidence ACS hstrops flat 500-600 range - Last cath 8/22 with new occlusion of SVG-OM and high-grade stenosis of SVG -> LAD -> D2 requiring high-risk PCI and IABP support  - Continue ASA/statin - Ideally would warrant R/L cath with drop in EF but renal dysfunction prohibitive for coronary angiography. Can consider Lexiscan Myoview prior to d/c  3. Acute on chronic kidney injury CKD 3b at baseline - Baseline SCr ~2.5 - Now 3.3 - Suspect cardiorenal - Followed by DR. Singh CKD  4. Acute on chronic hypoxic/hypercarbic respiratory failure - suspect primarily OSA/OHS but also HF - continue CPAP. Diurese - Needs weight loss  5. Metabolic encephalopathy - suspect combination of CO2 retention and possible early uremia - continue Bipap - watch renal function - check NH3  6. Morbid obesity - Body mass index is 45.17 kg/m. - consider GLP1RA as outpatient  6. Severe debility - needs PT/OT - likely needs short-term rehab/SNF at d/c  7. DM2 - Per primary  8. L knee osteoarthritis - family inquiring about steroid injection prior to d/c   Length of Stay: 4  Arvilla Meres, MD  02/18/2023, 11:13 AM  Advanced Heart Failure Team Pager [phone] (M-F; 7a - 5p)  Please contact [facility] for night-coverage after hours (4p -7a ) and weekends on [email or web address]

## 2023-02-18 NOTE — TOC Progression Note (Addendum)
Transition of Care [facility]) - Progression Note    Patient Details  Name: Tony Williams MRN: [id number] Date of Birth: 1945/06/10  [facility] CM/SW Contact  Liliana Cline, LCSW Phone Number: 02/18/2023, 10:59 AM  Clinical Narrative:     Patient's daughter [name] states they have spoken as a family and are now agreeable to SNF recommendation. Prefer [place] area. They plan to tour some local facilities to see their preferences. CSW provided information for Medicare Care Compare ratings for patient/family to review. SNF work up started.   3:42- [name] states her top choices are [facility] or [place].       Expected Discharge Plan and Services                                               Social Determinants of Health (SDOH) Interventions SDOH Screenings   Food Insecurity: No Food Insecurity (02/15/2023)  Housing: Low Risk  (02/15/2023)  Transportation Needs: No Transportation Needs (02/15/2023)  Utilities: Not At Risk (02/15/2023)  Alcohol Screen: Low Risk  (08/10/2022)  Depression (PHQ2-9): Low Risk  (08/10/2022)  Financial Resource Strain: Low Risk  (05/19/2021)  Physical Activity: Inactive (05/19/2021)  Social Connections: Unknown (05/19/2021)  Stress: No Stress Concern Present (05/19/2021)  Recent Concern: Stress - Stress Concern Present (02/26/2021)  Tobacco Use: Medium Risk (02/16/2023)    Readmission Risk Interventions    02/15/2023    9:37 AM 12/11/2020   11:06 AM  Readmission Risk Prevention Plan  Transportation Screening Complete Complete  PCP or Specialist Appt within 5-7 Days  Complete  PCP or Specialist Appt within 3-5 Days Complete   Medication Review (RN CM)  Complete  HRI or Home Care Consult Complete   Social Work Consult for Recovery Care Planning/Counseling Complete   Palliative Care Screening Not Applicable   Medication Review Oceanographer) Not Complete   Med Review Comments patient will discuss medication upon discharge

## 2023-02-18 NOTE — Progress Notes (Signed)
Heart Failure Nurse Navigator Progress Note  PCP: Erasmo Downer, MD PCP-Cardiologist: Debbe Odea, MD Admission Diagnosis:  Acute on Chronic Congestive Heart Failure, unspecified heart failure type NSTEMI (non-ST elevated myocardial infarction) Admitted from: Home  Presentation:   Tony Williams presented with shortness of breath and significant swelling in his legs and moving to his arms and abdomen recently.  Patient noted a 15 pound weight gain over the last week.  He was initially noted to be hypoxic in triage and placed on 3L [place] oxygen with an increase of O2 sat to 93%.CXR-showed significant pulmonary edema. BNP 627.9.  ECHO/ LVEF: 30-35%  Clinical Course:  Past Medical History:  Diagnosis Date   Bladder cancer (HCC) 2013   CAD (coronary artery disease)    a. 2007 CABG x 4: LIMA->D1, VG->OM1, VG->D2->mLAD; b. 2018 NSTEMI/PCI: DES to VG->OM; c. 10/2020 NSTEMI/PCI: LM 90d, LAD 130m, D1 100, LCX 100ost/p, RCA sev diff dzs, VG->D2->mLAD 99/90 before D2 (4.0x15 On[employer] Frontier DES), 90 before mLAD (4.0x15 Onyx Frontier DES), LIMA->D1 ok, VG->OM1 100; d. 10/2021 MV: no isch. mod basal to mid inf and antsept infarct.   Chronic heart failure with preserved ejection fraction (HFpEF) (HCC)    a. 10/2020 Echo: EF 50-55%, apical HK, GrII DD, mod dil LA; b. 11/2020 Echo: EF 60-65%, no rwma; c. 10/2021 Echo: EF 50-55%, inf HK, GrI DD, nl RV fxn, mildly dil LA, mild MR.   Chronic kidney disease    COPD (chronic obstructive pulmonary disease) (HCC)    Depression    Diabetes mellitus without complication (HCC)    Hx of CABG x 4    LIMA-D2, SVG-OM (occluded), SeqSVG-D1-LAD.   Labile hypertension    Myocardial infarction [facility])      Social History   Socioeconomic History   Marital status: Married    Spouse name: Not on file   Number of children: 2   Years of education: Not on file   Highest education level: High school graduate  Occupational History   Occupation: retired  Tobacco  Use   Smoking status: Former    Current packs/day: 0.00    Average packs/day: 1 pack/day for 30.0 years (30.0 ttl pk-yrs)    Types: Cigarettes    Start date: 10/22/1973    Quit date: 10/23/2003    Years since quitting: 19.3   Smokeless tobacco: Never  Vaping Use   Vaping status: Never Used  Substance and Sexual Activity   Alcohol use: Never   Drug use: Never   Sexual activity: Not Currently  Other Topics Concern   Not on file  Social History Narrative   Not on file   Social Determinants of Health   Financial Resource Strain: Low Risk  (02/18/2023)   Overall Financial Resource Strain (CARDIA)    Difficulty of Paying Living Expenses: Not hard at all  Food Insecurity: No Food Insecurity (02/15/2023)   Hunger Vital Sign    Worried About Running Out of Food in the Last Year: Never true    Ran Out of Food in the Last Year: Never true  Transportation Needs: No Transportation Needs (02/18/2023)   PRAPARE - Administrator, Civil Service (Medical): No    Lack of Transportation (Non-Medical): No  Physical Activity: Inactive (05/19/2021)   Exercise Vital Sign    Days of Exercise per Week: 0 days    Minutes of Exercise per Session: 0 min  Stress: No Stress Concern Present (05/19/2021)   [employer] of Occupational Health - Occupational  Stress Questionnaire    Feeling of Stress : Only a little  Recent Concern: Stress - Stress Concern Present (02/26/2021)   [employer] of Occupational Health - Occupational Stress Questionnaire    Feeling of Stress : To some extent  Social Connections: Unknown (05/19/2021)   Social Connection and Isolation Panel [NHANES]    Frequency of Communication with Friends and Family: Not on file    Frequency of Social Gatherings with Friends and Family: Twice a week    Attends Religious Services: More than 4 times per year    Active Member of [employer] or Organizations: No    Attends Engineer, structural: Never    Marital Status: Married    Water engineer and Provision:  Detailed education and instructions provided on heart failure disease management including the following:  Signs and symptoms of Heart Failure When to call the physician Importance of daily weights Low sodium diet Fluid restriction Medication management Anticipated future follow-up appointments  Patient education given on each of the above topics.  Patient acknowledges understanding via teach back method and acceptance of all instructions.  Education Materials:  "Living Better With Heart Failure" Booklet, HF zone tool, & Daily Weight Tracker Tool.  Patient has scale at home: Yes Patient has pill box at home: No. The family will get one for him.    High Risk Criteria for Readmission and/or Poor Patient Outcomes: Heart failure hospital admissions (last 6 months): 1  No Show rate: 3 Difficult social situation: No Demonstrates medication adherence: Yes Primary Language: English Literacy level: Reading, Writing & Comprehension  Barriers of Care:   Diet & Fluid Restrictions Daily Weights Medication Compliance  Considerations/Referrals:   Referral made to Heart Failure Pharmacist Stewardship: Yes Referral made to Heart Failure CSW/NCM TOC: No Referral made to Heart & Vascular TOC clinic: Yes  Items for Follow-up on DC/TOC: Diet & Fluid Restrictions Daily Weights Medication Compliance Continued Heart Failure Education  Roxy Horseman, RN, BSN [facility] Heart Failure Navigator Secure Chat Only

## 2023-02-18 NOTE — [place] FL2 (Signed)
[place] MEDICAID FL2 LEVEL OF CARE FORM     IDENTIFICATION  Patient Name: Tony Williams Birthdate: [birth year] Sex: male Admission Date (Current Location): 02/14/2023  [facility] [place] and [place] Number:  Chiropodist and Address:  [facility], [address], [place], [place] [address]      Provider Number: [id number]  Attending Physician Name and Address:  Darlin Priestly, MD  Relative Name and Phone Number:  Jaevin, Canal (Daughter)  [phone] (Mobile)    Current Level of Care: Hospital Recommended Level of Care: Skilled Nursing Facility Prior Approval Number:    Date Approved/Denied:   PASRR Number: [id number]  Discharge Plan:      Current Diagnoses: Patient Active Problem List   Diagnosis Date Noted   Dilated cardiomyopathy (HCC) 02/16/2023   Acute on chronic congestive heart failure (HCC) 02/15/2023   Type II diabetes mellitus with renal manifestations (HCC) 02/14/2023   CAD (coronary artery disease) 02/14/2023   Myocardial injury 02/14/2023   Depression with anxiety 02/14/2023   Acute on chronic respiratory failure with hypoxia (HCC) 02/14/2023   Blurry vision, bilateral 02/14/2023   Stage 3b chronic kidney disease (CKD) (HCC) 08/10/2022   Acute gout due to renal impairment involving toe of left foot 05/31/2022   Hx of CABG 10/29/2021   Chronic respiratory failure with hypoxia (HCC) 10/28/2021   Unstable angina (HCC) 10/28/2021   Senile purpura (HCC) 08/06/2021   OSA (obstructive sleep apnea) 05/29/2021   Daytime sleepiness 02/12/2021   Idiopathic hypotension 12/16/2020   Hx of myocardial infarction 12/16/2020   Bradycardia 12/16/2020   Dizziness 12/16/2020   Symptomatic sinus bradycardia 12/08/2020   Dyspnea 11/15/2020   Acute on chronic diastolic CHF (congestive heart failure) (HCC) 11/10/2020   NSTEMI (non-ST elevated myocardial infarction) (HCC) 11/08/2020   Polyp of transverse colon    Morbid obesity (HCC) 07/21/2020    Chronic pain of both knees 07/21/2020   Diabetic polyneuropathy associated with type 2 diabetes mellitus (HCC) 07/21/2020   Chronic gout due to renal impairment without tophus 07/21/2020   Hypertension associated with diabetes (HCC) 07/21/2020   Hyperlipidemia associated with type 2 diabetes mellitus (HCC) 07/21/2020   GAD (generalized anxiety disorder) 07/21/2020   Psychophysiological insomnia 07/21/2020   Vitamin D insufficiency 04/18/2020   Chronic obstructive pulmonary disease (HCC) 04/18/2020   T2DM (type 2 diabetes mellitus) (HCC) 04/18/2020   Coronary artery disease involving coronary bypass graft of native heart without angina pectoris 04/18/2020   S/P primary angioplasty with coronary stent 04/18/2020   History of smoking at least 1 pack per day for at least 30 years 04/18/2020    Orientation RESPIRATION BLADDER Height & Weight     Self, Time, Situation, Place  O2 Indwelling catheter Weight: (!) 314 lb 13.1 oz (142.8 kg) Height:  5\' 10"  (177.8 cm)  BEHAVIORAL SYMPTOMS/MOOD NEUROLOGICAL BOWEL NUTRITION STATUS        Diet (2 gram sodium)  AMBULATORY STATUS COMMUNICATION OF NEEDS Skin   Extensive Assist Verbally Normal                       Personal Care Assistance Level of Assistance  Bathing, Dressing, Feeding Bathing Assistance: Maximum assistance Feeding assistance: Limited assistance Dressing Assistance: Maximum assistance     Functional Limitations Info             SPECIAL CARE FACTORS FREQUENCY  PT (By licensed PT), OT (By licensed OT)     PT Frequency: 5 times per week OT Frequency: 5  times per week            Contractures      Additional Factors Info  Code Status, Allergies Code Status Info: full Allergies Info: Dilaudid (Hydromorphone), Butorphanol, Morphine, Pedi-pre Tape Spray (Wound Dressing Adhesive), Statins, Zocor (Simvastatin)           Current Medications (02/18/2023):  This is the current hospital active medication  list Current Facility-Administered Medications  Medication Dose Route Frequency Provider Last Rate Last Admin   acetaminophen (TYLENOL) tablet 650 mg  650 mg Oral Q6H PRN Lorretta Harp, MD   650 mg at 02/16/23 2154   albuterol (PROVENTIL) (2.5 MG/3ML) 0.083% nebulizer solution 2.5 mg  2.5 mg Nebulization Q4H PRN Lorretta Harp, MD       allopurinol (ZYLOPRIM) tablet 300 mg  300 mg Oral Daily Lorretta Harp, MD   300 mg at 02/18/23 1610   aspirin EC tablet 81 mg  81 mg Oral Daily Lorretta Harp, MD   81 mg at 02/18/23 9604   Chlorhexidine Gluconate Cloth 2 % PADS 6 each  6 each Topical Daily Darlin Priestly, MD   6 each at 02/18/23 0911   cholecalciferol (VITAMIN D3) 25 MCG (1000 UNIT) tablet 4,000 Units  4,000 Units Oral Daily Lorretta Harp, MD   4,000 Units at 02/18/23 0908   clopidogrel (PLAVIX) tablet 75 mg  75 mg Oral Daily Lorretta Harp, MD   75 mg at 02/18/23 5409   dextromethorphan-guaiFENesin (MUCINEX DM) 30-600 MG per 12 hr tablet 1 tablet  1 tablet Oral BID PRN Lorretta Harp, MD       escitalopram (LEXAPRO) tablet 20 mg  20 mg Oral Daily Lorretta Harp, MD   20 mg at 02/18/23 8119   ezetimibe (ZETIA) tablet 10 mg  10 mg Oral Daily Creig Hines, NP   10 mg at 02/18/23 0905   furosemide (LASIX) 160 mg in dextrose 5 % 50 mL IVPB  160 mg Intravenous BID Bensimhon, Bevelyn Buckles, MD       heparin injection 5,000 Units  5,000 Units Subcutaneous Tedra Coupe Darlin Priestly, MD   5,000 Units at 02/18/23 1478   hydrALAZINE (APRESOLINE) injection 5 mg  5 mg Intravenous Q2H PRN Lorretta Harp, MD       insulin aspart (novoLOG) injection 0-5 Units  0-5 Units Subcutaneous QHS Lorretta Harp, MD       insulin aspart (novoLOG) injection 0-9 Units  0-9 Units Subcutaneous TID WC Lorretta Harp, MD   1 Units at 02/18/23 2956   ipratropium-albuterol (DUONEB) 0.5-2.5 (3) MG/3ML nebulizer solution 3 mL  3 mL Nebulization BID Darlin Priestly, MD   3 mL at 02/18/23 0726   isosorbide mononitrate (IMDUR) 24 hr tablet 30 mg  30 mg Oral Daily Lorretta Harp, MD   30 mg at  02/18/23 0906   lidocaine (LIDODERM) 5 % 2 patch  2 patch Transdermal Q24H Darlin Priestly, MD   2 patch at 02/17/23 1605   loratadine (CLARITIN) tablet 10 mg  10 mg Oral Daily Lorretta Harp, MD   10 mg at 02/17/23 0836   nitroGLYCERIN (NITROSTAT) SL tablet 0.4 mg  0.4 mg Sublingual Q5 min PRN Lorretta Harp, MD       ondansetron [facility]) injection 4 mg  4 mg Intravenous Q8H PRN Lorretta Harp, MD       oxybutynin (DITROPAN) tablet 5 mg  5 mg Oral TID Darlin Priestly, MD   5 mg at 02/18/23 1007   polyvinyl alcohol (LIQUIFILM TEARS) 1.4 % ophthalmic solution  1 drop  1 drop Both Eyes PRN Darlin Priestly, MD       pravastatin (PRAVACHOL) tablet 40 mg  40 mg Oral q1800 Darlin Priestly, MD   40 mg at 02/17/23 1802   traMADol (ULTRAM) tablet 100 mg  100 mg Oral Q12H PRN Bensimhon, Bevelyn Buckles, MD   100 mg at 02/18/23 5409   traZODone (DESYREL) tablet 150 mg  150 mg Oral QHS Jawo, Modou L, NP   150 mg at 02/16/23 2155     Discharge Medications: Please see discharge summary for a list of discharge medications.  Relevant Imaging Results:  Relevant Lab Results:   Additional Information SS #: [id number]  Charisse Wendell E Trinisha Paget, LCSW

## 2023-02-18 NOTE — Plan of Care (Signed)
  Problem: Education: Goal: Ability to describe self-care measures that may prevent or decrease complications (Diabetes Survival Skills Education) will improve Outcome: Progressing Goal: Individualized Educational Video(s) Outcome: Progressing   Problem: Coping: Goal: Ability to adjust to condition or change in health will improve Outcome: Progressing   Problem: Fluid Volume: Goal: Ability to maintain a balanced intake and output will improve Outcome: Progressing   Problem: Health Behavior/Discharge Planning: Goal: Ability to identify and utilize available resources and services will improve Outcome: Progressing Goal: Ability to manage health-related needs will improve Outcome: Progressing   Problem: Metabolic: Goal: Ability to maintain appropriate glucose levels will improve Outcome: Progressing   Problem: Nutritional: Goal: Maintenance of adequate nutrition will improve Outcome: Progressing Goal: Progress toward achieving an optimal weight will improve Outcome: Progressing   Problem: Skin Integrity: Goal: Risk for impaired skin integrity will decrease Outcome: Progressing   Problem: Tissue Perfusion: Goal: Adequacy of tissue perfusion will improve Outcome: Progressing   Problem: Education: Goal: Ability to demonstrate management of disease process will improve Outcome: Progressing Goal: Ability to verbalize understanding of medication therapies will improve Outcome: Progressing Goal: Individualized Educational Video(s) Outcome: Progressing   Problem: Activity: Goal: Capacity to carry out activities will improve Outcome: Progressing   Problem: Cardiac: Goal: Ability to achieve and maintain adequate cardiopulmonary perfusion will improve Outcome: Progressing   Problem: Education: Goal: Knowledge of General Education information will improve Description: Including pain rating scale, medication(s)/side effects and non-pharmacologic comfort measures Outcome:  Progressing   Problem: Health Behavior/Discharge Planning: Goal: Ability to manage health-related needs will improve Outcome: Progressing   Problem: Clinical Measurements: Goal: Ability to maintain clinical measurements within normal limits will improve Outcome: Progressing Goal: Will remain free from infection Outcome: Progressing Goal: Diagnostic test results will improve Outcome: Progressing Goal: Respiratory complications will improve Outcome: Progressing Goal: Cardiovascular complication will be avoided Outcome: Progressing   Problem: Activity: Goal: Risk for activity intolerance will decrease Outcome: Progressing   Problem: Nutrition: Goal: Adequate nutrition will be maintained Outcome: Progressing   Problem: Coping: Goal: Level of anxiety will decrease Outcome: Progressing   Problem: Elimination: Goal: Will not experience complications related to bowel motility Outcome: Progressing Goal: Will not experience complications related to urinary retention Outcome: Progressing   Problem: Pain Management: Goal: General experience of comfort will improve Outcome: Progressing   Problem: Safety: Goal: Ability to remain free from injury will improve Outcome: Progressing   Problem: Skin Integrity: Goal: Risk for impaired skin integrity will decrease Outcome: Progressing

## 2023-02-18 NOTE — Progress Notes (Signed)
PROGRESS NOTE    Tony Williams  NWG:[id number] DOB: Sep 19, 1945 DOA: 02/14/2023 PCP: Erasmo Downer, MD  [id number]  LOS: 4 days   Brief hospital course:   Assessment & Plan: 77 y.o. male with medical history significant of dCHF, HLD, HTN, DM, COPD on 3L oxygen, CAD (s/p of CABG and stent), gout, CKD-3b, depression with anxiety, morbid obesity, OSA, former smoker, bilateral cancer, who presents with SOB.   Patient states that he has shortness of breath in the past several weeks which has been progressively worsening.  Patient is dry cough, no fever or chills.  He had mild substernal chest pain earlier, which has resolved. Patient also has worsening bilateral lower leg edema and 15 pounds of weight gain recently.  Patient has nausea, no vomiting, diarrhea or abdominal pain.  No symptoms of UTI.  Patient states he has intermittent bilateral blurry vision for more than 3 weeks.  He reports mild numbness in both feet, and generalized weakness.  No unilateral weakness in extremities.  No facial droop or slurred speech.   Patient is normally using 3 L oxygen at baseline, but found to have acute respiratory distress, with oxygen desaturation to 82% and difficulty speaking in full sentence, currently requiring 6 L oxygen with 93% of saturation.   Acute on chronic respiratory failure with hypoxia due to acute on chronic diastolic CHF (congestive heart failure) (HCC): 2D echo on 10/29/2021 showed EF 50-55% with grade 1 diastolic dysfunction.  Patient has 3+ leg edema, 15 pounds weight gain, elevated BNP 627, clinically consistent with CHF exacerbation. --drop in ejection fraction down to 30% on echo down from 50% in 2023  --cardio consulted Plan: --IV lasix 160 BID and add metolazone - No ACE/ARB/ARNi or MRA with AKI/CKD - Eventual b-blocker - Possible SGLT2i depending on renal function   Hx of CAD (coronary artery disease)  Trop elevation 2/2 demand ischemia --trop 609. Pt had mild chest  pain earlier, which has resolved.  Patient is s/p of CABG and stent placement. - IV heparin is started by EDP Plan: --cont ASA plavix and statin - Ideally would warrant R/L cath with drop in EF but renal dysfunction prohibitive for coronary angiography. Can consider Lexiscan Myoview prior to d/c   Urinary retention --had bladder sphincter that urology had turned off, but still had retention.  Foley inserted on 12/4 with immediate return of 700 ml. --cont oxybutynin for bladder discomfort   Chronic obstructive pulmonary disease on 3L home O2 No evidence of acute exacerbation   Blurry vision, bilateral: Etiology is not clear. It has been going on for more than 3 weeks intermittently.  No unilateral weakness in extremities.  No facial droop or slurred speech.  I offered to do MRI, but the patient has claustrophobia, need open MRI.  Patient states that he wants to see eye doctor first before doing MRI. -Will get CT of head --> negative for acute intracranial abnormalities. Plan: -Patient is on aspirin and statin -Fall precaution   Hypertension associated with diabetes (HCC) -IV hydralazine as needed -Patient is on IV Lasix --cont Imdur --hold coreg, amlodipine and hydralazine due to hypotension   Hyperlipidemia associated with type 2 diabetes mellitus (HCC) --cont statin as pravastatin   Type II diabetes mellitus with renal manifestations (HCC):  Recent A1c 6.3, well-controlled.  Patient is taking glipizide at home. --SSI for now   AKI Stage 3b chronic kidney disease (CKD) (HCC):  --Cr 2.79 on presentation.  Cr up to 3.41 this morning. -Avoid using renal  toxic medications. --monitor Cr while diuresing.   Chronic gout due to renal impairment without tophus --hold colchicine due to AKI --cont allopurinol   Depression with anxiety --cont Lexapro and trazodone   OSA (obstructive sleep apnea) --Ensure CPAP while sleeping   Morbid obesity (HCC): Body weight 142.9 kg, BMI  45.2 -Encourage losing weight -Exercise and healthy diet  Somnolence --noted to be very sleepy on 12/5, due to combination of not using CPAP night before, on home lyrica and gabapentin with doses too high for his kidney function now, and having taken oxycodone. --need to ensure CPAP while sleeping --hold Lyrica and gabapentin for now, resume at lower doses tomorrow  Left knee osteoarthritis --pain limiting mobility.  Pt missed his scheduled outpatient ortho steroid injection with Dr. Ernest Pine due to hospitalization. --discussed with oncall ortho today, who rec against steroid injection while inpatient due to higher risk of infection. --lidocaine patch    DVT prophylaxis: Heparin SQ Code Status: Full code  Family Communication: wife and daughter updated at bedside today Level of care: Telemetry Cardiac Dispo:   The patient is from: home Anticipated d/c is to: home Anticipated d/c date is: to be determined   Subjective and Interval History:  Pt more alert today.  No new complaint today.   Objective: Vitals:   02/18/23 0600 02/18/23 0640 02/18/23 0803 02/18/23 1159  BP:  124/60 120/67 (!) 118/53  Pulse:   67 62  Resp:  20 15 18   Temp:  98.6 F (37 C) 98.1 F (36.7 C) (!) 97.5 F (36.4 C)  TempSrc:  Oral    SpO2:  95% 95% 94%  Weight: (!) 142.8 kg     Height:        Intake/Output Summary (Last 24 hours) at 02/18/2023 1838 Last data filed at 02/18/2023 1734 Gross per 24 hour  Intake 357 ml  Output 3050 ml  Net -2693 ml   Filed Weights   02/16/23 0422 02/17/23 0338 02/18/23 0600  Weight: (!) 144 kg (!) 145.8 kg (!) 142.8 kg    Examination:   Constitutional: NAD, AAOx3 HEENT: conjunctivae and lids normal, EOMI CV: No cyanosis.   RESP: normal respiratory effort, on 3L SKIN: warm, dry Neuro: II - XII grossly intact.   Psych: Normal mood and affect.  Appropriate judgement and reason Foley present   Data Reviewed: I have personally reviewed labs and imaging  studies  Time spent: 50 minutes  Darlin Priestly, MD [facility] If 7PM-7AM, please contact night-coverage 02/18/2023, 6:38 PM

## 2023-02-19 DIAGNOSIS — N179 Acute kidney failure, unspecified: Secondary | ICD-10-CM | POA: Diagnosis not present

## 2023-02-19 DIAGNOSIS — I5033 Acute on chronic diastolic (congestive) heart failure: Secondary | ICD-10-CM | POA: Diagnosis not present

## 2023-02-19 DIAGNOSIS — I2510 Atherosclerotic heart disease of native coronary artery without angina pectoris: Secondary | ICD-10-CM

## 2023-02-19 DIAGNOSIS — J9621 Acute and chronic respiratory failure with hypoxia: Secondary | ICD-10-CM | POA: Diagnosis not present

## 2023-02-19 LAB — BASIC METABOLIC PANEL
Anion gap: 11 (ref 5–15)
BUN: 74 mg/dL — ABNORMAL HIGH (ref 8–23)
CO2: 34 mmol/L — ABNORMAL HIGH (ref 22–32)
Calcium: 9 mg/dL (ref 8.9–10.3)
Chloride: 95 mmol/L — ABNORMAL LOW (ref 98–111)
Creatinine, Ser: 2.75 mg/dL — ABNORMAL HIGH (ref 0.61–1.24)
GFR, Estimated: 23 mL/min — ABNORMAL LOW (ref >60)
Glucose, Bld: 102 mg/dL — ABNORMAL HIGH (ref 70–99)
Potassium: 4.2 mmol/L (ref 3.5–5.1)
Sodium: 140 mmol/L (ref 135–145)

## 2023-02-19 LAB — GLUCOSE, CAPILLARY
Glucose-Capillary: 100 mg/dL — ABNORMAL HIGH (ref 70–99)
Glucose-Capillary: 135 mg/dL — ABNORMAL HIGH (ref 70–99)
Glucose-Capillary: 153 mg/dL — ABNORMAL HIGH (ref 70–99)

## 2023-02-19 MED ORDER — POLYETHYLENE GLYCOL 3350 17 G PO PACK
34.0000 g | PACK | ORAL | Status: AC
  Administered 2023-02-19 (×4): 34 g via ORAL
  Filled 2023-02-19 (×4): qty 2

## 2023-02-19 MED ORDER — HYDROCODONE-ACETAMINOPHEN 5-325 MG PO TABS
1.0000 | ORAL_TABLET | Freq: Four times a day (QID) | ORAL | Status: DC | PRN
  Administered 2023-02-19: 1 via ORAL
  Filled 2023-02-19: qty 1

## 2023-02-19 MED ORDER — ORAL CARE MOUTH RINSE: 15.0000 mL | OROMUCOSAL | Status: DC | PRN

## 2023-02-19 MED ORDER — GABAPENTIN 300 MG PO CAPS
300.0000 mg | ORAL_CAPSULE | Freq: Three times a day (TID) | ORAL | Status: DC
  Administered 2023-02-19 (×2): 300 mg via ORAL
  Filled 2023-02-19 (×3): qty 1

## 2023-02-19 MED ORDER — PREGABALIN 50 MG PO CAPS
100.0000 mg | ORAL_CAPSULE | Freq: Two times a day (BID) | ORAL | Status: DC
  Administered 2023-02-19: 100 mg via ORAL
  Filled 2023-02-19: qty 2

## 2023-02-19 MED ORDER — SIMETHICONE 80 MG PO CHEW
160.0000 mg | CHEWABLE_TABLET | Freq: Once | ORAL | Status: AC
  Administered 2023-02-19: 160 mg via ORAL
  Filled 2023-02-19: qty 2

## 2023-02-19 MED ORDER — OXYCODONE HCL 5 MG PO TABS
5.0000 mg | ORAL_TABLET | Freq: Four times a day (QID) | ORAL | Status: DC | PRN
  Administered 2023-02-19 – 2023-02-26 (×7): 5 mg via ORAL
  Filled 2023-02-19 (×9): qty 1

## 2023-02-19 NOTE — Plan of Care (Signed)
  Problem: Coping: Goal: Ability to adjust to condition or change in health will improve Outcome: Progressing   Problem: Fluid Volume: Goal: Ability to maintain a balanced intake and output will improve Outcome: Progressing   Problem: Health Behavior/Discharge Planning: Goal: Ability to manage health-related needs will improve Outcome: Progressing   Problem: Nutritional: Goal: Maintenance of adequate nutrition will improve Outcome: Progressing   Problem: Skin Integrity: Goal: Risk for impaired skin integrity will decrease Outcome: Progressing   Problem: Tissue Perfusion: Goal: Adequacy of tissue perfusion will improve Outcome: Progressing   Problem: Education: Goal: Ability to demonstrate management of disease process will improve Outcome: Progressing Goal: Ability to verbalize understanding of medication therapies will improve Outcome: Progressing   Problem: Activity: Goal: Capacity to carry out activities will improve Outcome: Progressing   Problem: Cardiac: Goal: Ability to achieve and maintain adequate cardiopulmonary perfusion will improve Outcome: Progressing   Problem: Clinical Measurements: Goal: Will remain free from infection Outcome: Progressing Goal: Diagnostic test results will improve Outcome: Progressing Goal: Respiratory complications will improve Outcome: Progressing Goal: Cardiovascular complication will be avoided Outcome: Progressing   Problem: Activity: Goal: Risk for activity intolerance will decrease Outcome: Progressing   Problem: Nutrition: Goal: Adequate nutrition will be maintained Outcome: Progressing   Problem: Coping: Goal: Level of anxiety will decrease Outcome: Progressing   Problem: Elimination: Goal: Will not experience complications related to urinary retention Outcome: Progressing   Problem: Pain Management: Goal: General experience of comfort will improve Outcome: Progressing   Problem: Safety: Goal: Ability to  remain free from injury will improve Outcome: Progressing   Problem: Skin Integrity: Goal: Risk for impaired skin integrity will decrease Outcome: Progressing

## 2023-02-19 NOTE — Plan of Care (Signed)
  Problem: Education: Goal: Ability to describe self-care measures that may prevent or decrease complications (Diabetes Survival Skills Education) will improve Outcome: Progressing Goal: Individualized Educational Video(s) Outcome: Progressing   Problem: Coping: Goal: Ability to adjust to condition or change in health will improve Outcome: Progressing   Problem: Fluid Volume: Goal: Ability to maintain a balanced intake and output will improve Outcome: Progressing   Problem: Health Behavior/Discharge Planning: Goal: Ability to identify and utilize available resources and services will improve Outcome: Progressing Goal: Ability to manage health-related needs will improve Outcome: Progressing   Problem: Metabolic: Goal: Ability to maintain appropriate glucose levels will improve Outcome: Progressing   Problem: Nutritional: Goal: Maintenance of adequate nutrition will improve Outcome: Progressing Goal: Progress toward achieving an optimal weight will improve Outcome: Progressing   Problem: Skin Integrity: Goal: Risk for impaired skin integrity will decrease Outcome: Progressing   Problem: Tissue Perfusion: Goal: Adequacy of tissue perfusion will improve Outcome: Progressing   Problem: Education: Goal: Ability to demonstrate management of disease process will improve Outcome: Progressing Goal: Ability to verbalize understanding of medication therapies will improve Outcome: Progressing Goal: Individualized Educational Video(s) Outcome: Progressing   Problem: Activity: Goal: Capacity to carry out activities will improve Outcome: Progressing   Problem: Cardiac: Goal: Ability to achieve and maintain adequate cardiopulmonary perfusion will improve Outcome: Progressing   Problem: Education: Goal: Knowledge of General Education information will improve Description: Including pain rating scale, medication(s)/side effects and non-pharmacologic comfort measures Outcome:  Progressing   Problem: Health Behavior/Discharge Planning: Goal: Ability to manage health-related needs will improve Outcome: Progressing   Problem: Clinical Measurements: Goal: Ability to maintain clinical measurements within normal limits will improve Outcome: Progressing Goal: Will remain free from infection Outcome: Progressing Goal: Diagnostic test results will improve Outcome: Progressing Goal: Respiratory complications will improve Outcome: Progressing Goal: Cardiovascular complication will be avoided Outcome: Progressing   Problem: Activity: Goal: Risk for activity intolerance will decrease Outcome: Progressing   Problem: Nutrition: Goal: Adequate nutrition will be maintained Outcome: Progressing   Problem: Coping: Goal: Level of anxiety will decrease Outcome: Progressing   Problem: Elimination: Goal: Will not experience complications related to bowel motility Outcome: Progressing Goal: Will not experience complications related to urinary retention Outcome: Progressing   Problem: Pain Management: Goal: General experience of comfort will improve Outcome: Progressing   Problem: Safety: Goal: Ability to remain free from injury will improve Outcome: Progressing   Problem: Skin Integrity: Goal: Risk for impaired skin integrity will decrease Outcome: Progressing

## 2023-02-19 NOTE — Progress Notes (Signed)
Rounding Note    Patient Name: Tony Williams Date of Encounter: 02/19/2023  [facility] Cardiologist: Debbe Odea, MD   Subjective   Seen by Dr. Gala Romney with advanced heart failure yesterday. Reviewed recommendations to aggressively push diuresis; with this he had almost 5 L fluid out yesterday. Renal function improved today from Cr 3.33 to 2.75, supporting cardiorenal syndrome. He reports breathing is improved.  Inpatient Medications    Scheduled Meds:  allopurinol  300 mg Oral Daily   aspirin EC  81 mg Oral Daily   Chlorhexidine Gluconate Cloth  6 each Topical Daily   cholecalciferol  4,000 Units Oral Daily   clopidogrel  75 mg Oral Daily   escitalopram  20 mg Oral Daily   ezetimibe  10 mg Oral Daily   gabapentin  300 mg Oral TID   heparin injection (subcutaneous)  5,000 Units Subcutaneous Q8H   insulin aspart  0-5 Units Subcutaneous QHS   insulin aspart  0-9 Units Subcutaneous TID WC   isosorbide mononitrate  30 mg Oral Daily   lidocaine  2 patch Transdermal Q24H   loratadine  10 mg Oral Daily   polyethylene glycol  34 g Oral Q2H   pravastatin  40 mg Oral q1800   pregabalin  100 mg Oral BID   traZODone  150 mg Oral QHS   Continuous Infusions:  furosemide 160 mg (02/19/23 0845)   PRN Meds: acetaminophen, albuterol, dextromethorphan-guaiFENesin, hydrALAZINE, nitroGLYCERIN, ondansetron (ZOFRAN) IV, mouth rinse, oxyCODONE, polyvinyl alcohol   Vital Signs    Vitals:   02/19/23 0311 02/19/23 0814 02/19/23 0819 02/19/23 1137  BP: (!) 117/58 (!) 136/57  (!) 135/57  Pulse: 76 70 72 67  Resp: 20 18 16 20   Temp: 98.4 F (36.9 C) 97.7 F (36.5 C)  97.7 F (36.5 C)  TempSrc:      SpO2: 91% 95% 92% 95%  Weight:      Height:        Intake/Output Summary (Last 24 hours) at 02/19/2023 1304 Last data filed at 02/19/2023 1156 Gross per 24 hour  Intake 700 ml  Output 6000 ml  Net -5300 ml      02/18/2023    6:00 AM 02/17/2023    3:38 AM 02/16/2023     4:22 AM  Last 3 Weights  Weight (lbs) 314 lb 13.1 oz 321 lb 6.9 oz 317 lb 7.4 oz  Weight (kg) 142.8 kg 145.8 kg 144 kg      Telemetry    NSR - Personally Reviewed  Physical Exam   GEN: No acute distress.  O2 by nasal cannula Neck: JVD not well visualized 2/2 body habitus Cardiac: RRR, no murmurs, rubs, or gallops.  Respiratory: Rales at bilateral bases GI: Soft, nontender, non-distended  MS: bilateral 2+ LE edema; No deformity. Neuro:  Nonfocal  Psych: Normal affect   New pertinent results (labs, ECG, imaging, cardiac studies)     Patient Profile     77 y.o. male with PMH CAD s/p CABG and PCI, chronic diastolic heart failure, chronic kidney disease stage 3b, type II diabetes presenting with acute on chronic heart failure, found to have newly reduced EF this admission.  Assessment & Plan    Acute systolic and diastolic heart failure Acute on chronic hypoxic and hypercarbic respiratory failure Acute kidney injury on chronic kidney disease stage 3b -EF newly reduced to 30-35% this admission -diuretics increased yesterday, had nearly 5 L urine output -Cr improved from 3.33 to 2.75 today -will continue to  be aggressive with diuresis given improvement. Continue furosemide 160 mg BID, will dose with metolazone PRN -No ACEi/ARB/ARNI/MRA given renal function -ideally would pursue ischemic eval, but with renal function this is not possible at this time. Has known CAD -continue CPAP for OSA/OHS  CAD s/p CABG and PCI -see above re: ischemic eval -continue aspirin, clopidogrel, ezetimibe, statin, imdur  Type II diabetes Morbid obesity -good candidate for GLP1 as outpatient -consider SGLT2i based on renal trajectory  Hypertension -on significant BP meds as outpatient, most held this admission as BP had been intermittently low    Signed, Jodelle Red, MD  02/19/2023, 1:04 PM

## 2023-02-19 NOTE — Progress Notes (Signed)
PROGRESS NOTE    Tony Williams  ATF:[id number] DOB: April 30, 1945 DOA: 02/14/2023 PCP: Erasmo Downer, MD  237A/[id number]-AA  LOS: 5 days   Brief hospital course:   Assessment & Plan: 77 y.o. male with medical history significant of dCHF, HLD, HTN, DM, COPD on 3L oxygen, CAD (s/p of CABG and stent), gout, CKD-3b, depression with anxiety, morbid obesity, OSA, former smoker, bilateral cancer, who presents with SOB.   Patient states that he has shortness of breath in the past several weeks which has been progressively worsening.  Patient is dry cough, no fever or chills.  He had mild substernal chest pain earlier, which has resolved. Patient also has worsening bilateral lower leg edema and 15 pounds of weight gain recently.  Patient has nausea, no vomiting, diarrhea or abdominal pain.  No symptoms of UTI.  Patient states he has intermittent bilateral blurry vision for more than 3 weeks.  He reports mild numbness in both feet, and generalized weakness.  No unilateral weakness in extremities.  No facial droop or slurred speech.   Patient is normally using 3 L oxygen at baseline, but found to have acute respiratory distress, with oxygen desaturation to 82% and difficulty speaking in full sentence, currently requiring 6 L oxygen with 93% of saturation.   Acute on chronic respiratory failure with hypoxia due to acute on chronic diastolic CHF (congestive heart failure) (HCC): 2D echo on 10/29/2021 showed EF 50-55% with grade 1 diastolic dysfunction.  Patient has 3+ leg edema, 15 pounds weight gain, elevated BNP 627, clinically consistent with CHF exacerbation. --drop in ejection fraction down to 30% on echo down from 50% in 2023  --cardio consulted, diuretics increased yesterday, had nearly 5 L urine output, with improvement in Cr. Plan: --cont IV lasix 160 BID with metolazone PRN - No ACE/ARB/ARNi or MRA with AKI/CKD - Eventual b-blocker - Possible SGLT2i depending on renal function   Hx of CAD  (coronary artery disease)  Trop elevation 2/2 demand ischemia --trop 609. Pt had mild chest pain earlier, which has resolved.  Patient is s/p of CABG and stent placement. - IV heparin is started by EDP Plan: --cont ASA plavix  --cont statin - Ideally would warrant R/L cath with drop in EF but renal dysfunction prohibitive for coronary angiography. Can consider Lexiscan Myoview prior to d/c   Urinary retention --had bladder sphincter that urology had turned off, but still had retention.  Foley inserted on 12/4 with immediate return of 700 ml. --cont Foley   Chronic obstructive pulmonary disease on 3L home O2 No evidence of acute exacerbation   Blurry vision, bilateral: Etiology is not clear. It has been going on for more than 3 weeks intermittently.  No unilateral weakness in extremities.  No facial droop or slurred speech.  I offered to do MRI, but the patient has claustrophobia, need open MRI.  Patient states that he wants to see eye doctor first before doing MRI. -Will get CT of head --> negative for acute intracranial abnormalities. Plan: -Patient is on aspirin and statin -Fall precaution   Hypertension associated with diabetes (HCC) -IV hydralazine as needed -Patient is on IV Lasix --cont Imdur --hold coreg, amlodipine and hydralazine due to hypotension   Hyperlipidemia associated with type 2 diabetes mellitus (HCC) --cont statin as pravastatin   Type II diabetes mellitus with renal manifestations (HCC):  Recent A1c 6.3, well-controlled.  Patient is taking glipizide at home. --d/c BG checks and SSI since BG have been within goals   AKI Stage 3b  chronic kidney disease (CKD) (HCC):  --Cr peaked at 3.41.  improved with diuresis --monitor Cr while diuresing   Chronic gout due to renal impairment without tophus --hold colchicine due to AKI --cont allopurinol   Depression with anxiety --cont Lexapro and trazodone   OSA (obstructive sleep apnea) --Ensure CPAP while  sleeping   Morbid obesity (HCC): Body weight 142.9 kg, BMI 45.2 -Encourage losing weight -Exercise and healthy diet  Somnolence --noted to be very sleepy on 12/5, due to combination of not using CPAP night before, on home lyrica and gabapentin with doses too high for his kidney function, and having taken oxycodone. --need to ensure CPAP while sleeping  Left knee osteoarthritis --pain limiting mobility.  Pt missed his scheduled outpatient ortho steroid injection with Dr. Ernest Pine due to hospitalization. --discussed with oncall ortho on 12/6, who rec against steroid injection while inpatient due to higher risk of infection. --lidocaine patch --oxycodone PRN    DVT prophylaxis: Heparin SQ Code Status: Full code  Family Communication:  Level of care: Telemetry Cardiac Dispo:   The patient is from: home Anticipated d/c is to: SNF rehab Anticipated d/c date is: to be determined   Subjective and Interval History:  Pt reported back and knee pain.  Urine light pink today.  Pt denied any pain with urethra or bladder.   Objective: Vitals:   02/19/23 0814 02/19/23 0819 02/19/23 1137 02/19/23 1555  BP: (!) 136/57  (!) 135/57 (!) 164/68  Pulse: 70 72 67 72  Resp: 18 16 20 20   Temp: 97.7 F (36.5 C)  97.7 F (36.5 C) 97.7 F (36.5 C)  TempSrc:      SpO2: 95% 92% 95% 95%  Weight:      Height:        Intake/Output Summary (Last 24 hours) at 02/19/2023 1822 Last data filed at 02/19/2023 1420 Gross per 24 hour  Intake 940 ml  Output 4800 ml  Net -3860 ml   Filed Weights   02/16/23 0422 02/17/23 0338 02/18/23 0600  Weight: (!) 144 kg (!) 145.8 kg (!) 142.8 kg    Examination:   Constitutional: NAD, AAOx3 HEENT: conjunctivae and lids normal, EOMI CV: No cyanosis.   RESP: normal respiratory effort Neuro: II - XII grossly intact.   Psych: Normal mood and affect.  Appropriate judgement and reason  Foley present with light pink urine   Data Reviewed: I have personally  reviewed labs and imaging studies  Time spent: 50 minutes  Darlin Priestly, MD [facility] If 7PM-7AM, please contact night-coverage 02/19/2023, 6:22 PM

## 2023-02-20 ENCOUNTER — Inpatient Hospital Stay: Payer: Medicare Other

## 2023-02-20 DIAGNOSIS — I2510 Atherosclerotic heart disease of native coronary artery without angina pectoris: Secondary | ICD-10-CM | POA: Diagnosis not present

## 2023-02-20 DIAGNOSIS — R310 Gross hematuria: Secondary | ICD-10-CM

## 2023-02-20 DIAGNOSIS — N179 Acute kidney failure, unspecified: Secondary | ICD-10-CM | POA: Diagnosis not present

## 2023-02-20 DIAGNOSIS — R4182 Altered mental status, unspecified: Secondary | ICD-10-CM

## 2023-02-20 DIAGNOSIS — J9621 Acute and chronic respiratory failure with hypoxia: Secondary | ICD-10-CM | POA: Diagnosis not present

## 2023-02-20 DIAGNOSIS — I5033 Acute on chronic diastolic (congestive) heart failure: Secondary | ICD-10-CM | POA: Diagnosis not present

## 2023-02-20 LAB — URINALYSIS, COMPLETE (UACMP) WITH MICROSCOPIC
RBC / HPF: >50 RBC/hpf (ref 0–5)
WBC, UA: >50 WBC/hpf (ref 0–5)

## 2023-02-20 LAB — CBC
HCT: 39.7 % (ref 39.0–52.0)
Hemoglobin: 12.2 g/dL — ABNORMAL LOW (ref 13.0–17.0)
MCH: 28.2 pg (ref 26.0–34.0)
MCHC: 30.7 g/dL (ref 30.0–36.0)
MCV: 91.7 fL (ref 80.0–100.0)
Platelets: 198 10*3/uL (ref 150–400)
RBC: 4.33 MIL/uL (ref 4.22–5.81)
RDW: 15.9 % — ABNORMAL HIGH (ref 11.5–15.5)
WBC: 7.3 10*3/uL (ref 4.0–10.5)
nRBC: 0 % (ref 0.0–0.2)

## 2023-02-20 LAB — BASIC METABOLIC PANEL
Anion gap: 13 (ref 5–15)
BUN: 71 mg/dL — ABNORMAL HIGH (ref 8–23)
CO2: 39 mmol/L — ABNORMAL HIGH (ref 22–32)
Calcium: 9.3 mg/dL (ref 8.9–10.3)
Chloride: 87 mmol/L — ABNORMAL LOW (ref 98–111)
Creatinine, Ser: 2.38 mg/dL — ABNORMAL HIGH (ref 0.61–1.24)
GFR, Estimated: 28 mL/min — ABNORMAL LOW (ref >60)
Glucose, Bld: 111 mg/dL — ABNORMAL HIGH (ref 70–99)
Potassium: 3.7 mmol/L (ref 3.5–5.1)
Sodium: 139 mmol/L (ref 135–145)

## 2023-02-20 LAB — GLUCOSE, CAPILLARY: Glucose-Capillary: 117 mg/dL — ABNORMAL HIGH (ref 70–99)

## 2023-02-20 MED ORDER — MAGNESIUM CITRATE PO SOLN
1.0000 | Freq: Once | ORAL | Status: AC
  Administered 2023-02-20: 1 via ORAL
  Filled 2023-02-20: qty 296

## 2023-02-20 MED ORDER — POLYETHYLENE GLYCOL 3350 17 G PO PACK
34.0000 g | PACK | ORAL | Status: AC
  Administered 2023-02-20 (×2): 34 g via ORAL
  Filled 2023-02-20 (×2): qty 2

## 2023-02-20 MED ORDER — QUETIAPINE FUMARATE 25 MG PO TABS
50.0000 mg | ORAL_TABLET | Freq: Every day | ORAL | Status: DC
  Administered 2023-02-20 – 2023-02-21 (×2): 50 mg via ORAL
  Filled 2023-02-20 (×2): qty 2

## 2023-02-20 NOTE — Progress Notes (Signed)
PROGRESS NOTE    Tony Williams  XNA:[id number] DOB: 1945-11-02 DOA: 02/14/2023 PCP: Erasmo Downer, MD  237A/[id number]-AA  LOS: 6 days   Brief hospital course:   Assessment & Plan: 77 y.o. male with medical history significant of dCHF, HLD, HTN, DM, COPD on 3L oxygen, CAD (s/p of CABG and stent), gout, CKD-3b, depression with anxiety, morbid obesity, OSA, former smoker, bilateral cancer, who presents with SOB.   Patient states that he has shortness of breath in the past several weeks which has been progressively worsening.  Patient is dry cough, no fever or chills.  He had mild substernal chest pain earlier, which has resolved. Patient also has worsening bilateral lower leg edema and 15 pounds of weight gain recently.  Patient has nausea, no vomiting, diarrhea or abdominal pain.  No symptoms of UTI.  Patient states he has intermittent bilateral blurry vision for more than 3 weeks.  He reports mild numbness in both feet, and generalized weakness.  No unilateral weakness in extremities.  No facial droop or slurred speech.   Patient is normally using 3 L oxygen at baseline, but found to have acute respiratory distress, with oxygen desaturation to 82% and difficulty speaking in full sentence, currently requiring 6 L oxygen with 93% of saturation.   Acute on chronic respiratory failure with hypoxia due to acute on chronic diastolic CHF (congestive heart failure) (HCC): 2D echo on 10/29/2021 showed EF 50-55% with grade 1 diastolic dysfunction.  Patient has 3+ leg edema, 15 pounds weight gain, elevated BNP 627, clinically consistent with CHF exacerbation. --drop in ejection fraction down to 30% on echo down from 50% in 2023  --cardio consulted, diuretics increased yesterday, had nearly 5 L urine output, with improvement in Cr. Plan: --cont IV lasix 160 BID, per cardio - No ACE/ARB/ARNi or MRA with AKI/CKD - Eventual b-blocker - Possible SGLT2i depending on renal function   Hx of CAD (coronary  artery disease)  Trop elevation 2/2 demand ischemia --trop 609. Pt had mild chest pain earlier, which has resolved.  Patient is s/p of CABG and stent placement. - IV heparin is started by EDP Plan: --cont ASA  --hold plavix today due to hematuria --cont statin - Ideally would warrant R/L cath with drop in EF but renal dysfunction prohibitive for coronary angiography. Can consider Lexiscan Myoview prior to d/c   Urinary retention --had bladder sphincter that urology had turned off, but still had retention.  Foley inserted on 12/4 with immediate return of 700 ml. --cont Foley  Hematuria --sudden onset on 12/7, no known trauma.  Light pink urine turned into gross red urine today. --hold plavix for now --re-engage urology tomorrow  Hospital delirium --developed confusion and agitation night of 12/7. --start seroquel 50 mg daily   Chronic obstructive pulmonary disease on 3L home O2 No evidence of acute exacerbation   Blurry vision, bilateral: Etiology is not clear. It has been going on for more than 3 weeks intermittently.  No unilateral weakness in extremities.  No facial droop or slurred speech.  I offered to do MRI, but the patient has claustrophobia, need open MRI.  Patient states that he wants to see eye doctor first before doing MRI. -Will get CT of head --> negative for acute intracranial abnormalities. Plan: -Patient is on aspirin and statin -Fall precaution   Hypertension associated with diabetes (HCC) -IV hydralazine as needed -Patient is on IV Lasix --cont Imdur --hold coreg, amlodipine and hydralazine due to hypotension   Hyperlipidemia associated with type 2  diabetes mellitus (HCC) --cont statin as pravastatin   Type II diabetes mellitus with renal manifestations [facility]):  Recent A1c 6.3, well-controlled.  Patient is taking glipizide at home. --d/c'ed BG checks and SSI since BG have been within goals   AKI Stage 3b chronic kidney disease (CKD) (HCC):  --Cr peaked  at 3.41.  improved with diuresis --monitor Cr while diuresing   Chronic gout due to renal impairment without tophus --hold colchicine due to AKI --cont allopurinol   Depression with anxiety --cont Lexapro and trazodone   OSA (obstructive sleep apnea) --Ensure CPAP while sleeping   Morbid obesity (HCC): Body weight 142.9 kg, BMI 45.2 -Encourage losing weight -Exercise and healthy diet  Somnolence --noted to be very sleepy on 12/5, due to combination of not using CPAP night before, on home lyrica and gabapentin with doses too high for his kidney function, and having taken oxycodone. --need to ensure CPAP while sleeping --hold home Lyrica and gabapentin for now  Left knee osteoarthritis --pain limiting mobility.  Pt missed his scheduled outpatient ortho steroid injection with Dr. Ernest Pine due to hospitalization. --discussed with oncall ortho on 12/6, who rec against steroid injection while inpatient due to higher risk of infection. --lidocaine patch --oxycodone PRN    DVT prophylaxis: Heparin SQ Code Status: Full code  Family Communication: family updated at bedside today Level of care: Telemetry Cardiac Dispo:   The patient is from: home Anticipated d/c is to: SNF rehab Anticipated d/c date is: to be determined   Subjective and Interval History:  Pt was reportedly more confused and agitated last night.  Today, pt was more somnolent.  Hematuria worsened.   Objective: Vitals:   02/20/23 1159 02/20/23 1200 02/20/23 1331 02/20/23 1635  BP:  138/72  (!) 150/71  Pulse: 75  80 85  Resp: 16  17 18   Temp:    (!) 97.4 F (36.3 C)  TempSrc:    Axillary  SpO2: 99%  97% 92%  Weight:      Height:        Intake/Output Summary (Last 24 hours) at 02/20/2023 1758 Last data filed at 02/20/2023 1751 Gross per 24 hour  Intake 240 ml  Output 6650 ml  Net -6410 ml   Filed Weights   02/17/23 0338 02/18/23 0600 02/20/23 0500  Weight: (!) 145.8 kg (!) 142.8 kg 133 kg     Examination:   Constitutional: NAD, sleeping CV: No cyanosis.   RESP: on CPAP Foley present with gross red urine   Data Reviewed: I have personally reviewed labs and imaging studies  Time spent: 50 minutes  Darlin Priestly, MD [facility] If 7PM-7AM, please contact night-coverage 02/20/2023, 5:58 PM

## 2023-02-20 NOTE — Plan of Care (Signed)
  Problem: Coping: Goal: Ability to adjust to condition or change in health will improve Outcome: Progressing   Problem: Fluid Volume: Goal: Ability to maintain a balanced intake and output will improve Outcome: Progressing   Problem: Health Behavior/Discharge Planning: Goal: Ability to manage health-related needs will improve Outcome: Progressing   Problem: Nutritional: Goal: Maintenance of adequate nutrition will improve Outcome: Progressing   Problem: Skin Integrity: Goal: Risk for impaired skin integrity will decrease Outcome: Progressing   Problem: Tissue Perfusion: Goal: Adequacy of tissue perfusion will improve Outcome: Progressing   Problem: Activity: Goal: Capacity to carry out activities will improve Outcome: Progressing   Problem: Cardiac: Goal: Ability to achieve and maintain adequate cardiopulmonary perfusion will improve Outcome: Progressing   Problem: Clinical Measurements: Goal: Will remain free from infection Outcome: Progressing Goal: Diagnostic test results will improve Outcome: Progressing Goal: Respiratory complications will improve Outcome: Progressing Goal: Cardiovascular complication will be avoided Outcome: Progressing   Problem: Activity: Goal: Risk for activity intolerance will decrease Outcome: Progressing   Problem: Nutrition: Goal: Adequate nutrition will be maintained Outcome: Progressing   Problem: Coping: Goal: Level of anxiety will decrease Outcome: Progressing   Problem: Elimination: Goal: Will not experience complications related to bowel motility Outcome: Progressing Goal: Will not experience complications related to urinary retention Outcome: Progressing   Problem: Pain Management: Goal: General experience of comfort will improve Outcome: Progressing   Problem: Safety: Goal: Ability to remain free from injury will improve Outcome: Progressing   Problem: Skin Integrity: Goal: Risk for impaired skin integrity  will decrease Outcome: Progressing

## 2023-02-20 NOTE — Progress Notes (Addendum)
PT Cancellation Note  Patient Details Name: Tony Williams MRN: [id number] DOB: Nov 11, 1945   Cancelled Treatment:    Reason Eval/Treat Not Completed: Fatigue/lethargy limiting ability to participate  Attempted session AM and PM.  Family in stating he had a restless/sleepless night and is finally settled a bit.  Requested to defer session at this time.  Will return at a later time/date.   Danielle Dess 02/20/2023, 2:24 PM

## 2023-02-20 NOTE — Progress Notes (Signed)
Rounding Note    Patient Name: Tony Williams Date of Encounter: 02/20/2023  [facility] Cardiologist: Debbe Odea, MD   Subjective   Family reports new confusion/AMS since last night, agitated and pulling out IV's. Also with blood in urine. Patient is asleep on my exam with CPAP machine on. UOP -6.5L. kidney function improving  Inpatient Medications    Scheduled Meds:  allopurinol  300 mg Oral Daily   aspirin EC  81 mg Oral Daily   Chlorhexidine Gluconate Cloth  6 each Topical Daily   cholecalciferol  4,000 Units Oral Daily   escitalopram  20 mg Oral Daily   ezetimibe  10 mg Oral Daily   gabapentin  300 mg Oral TID   heparin injection (subcutaneous)  5,000 Units Subcutaneous Q8H   isosorbide mononitrate  30 mg Oral Daily   lidocaine  2 patch Transdermal Q24H   loratadine  10 mg Oral Daily   magnesium citrate  1 Bottle Oral Once   polyethylene glycol  34 g Oral Q2H   pravastatin  40 mg Oral q1800   pregabalin  100 mg Oral BID   traZODone  150 mg Oral QHS   Continuous Infusions:  furosemide 160 mg (02/20/23 1015)   PRN Meds: acetaminophen, albuterol, dextromethorphan-guaiFENesin, hydrALAZINE, nitroGLYCERIN, ondansetron (ZOFRAN) IV, mouth rinse, oxyCODONE, polyvinyl alcohol   Vital Signs    Vitals:   02/20/23 0009 02/20/23 0400 02/20/23 0500 02/20/23 0742  BP: (!) 145/54 126/64  (!) 157/75  Pulse: 71 74  86  Resp: 19 19  19   Temp: 98.8 F (37.1 C) 98.6 F (37 C)  98.4 F (36.9 C)  TempSrc: Oral Oral  Oral  SpO2: 95% 95%  90%  Weight:   133 kg   Height:        Intake/Output Summary (Last 24 hours) at 02/20/2023 1027 Last data filed at 02/20/2023 0339 Gross per 24 hour  Intake 480 ml  Output 6150 ml  Net -5670 ml      02/20/2023    5:00 AM 02/18/2023    6:00 AM 02/17/2023    3:38 AM  Last 3 Weights  Weight (lbs) 293 lb 3.4 oz 314 lb 13.1 oz 321 lb 6.9 oz  Weight (kg) 133 kg 142.8 kg 145.8 kg      Telemetry    NSR PAC/PVCs -  Personally Reviewed  ECG    No new - Personally Reviewed  Physical Exam   GEN: No acute distress.   Neck: unable to assess JVD Cardiac: RRR, no murmurs, rubs, or gallops.  Respiratory: decreased lung sounds GI: Soft, nontender, non-distended  MS: No edema; No deformity. Neuro:  Nonfocal  Psych: Normal affect   Labs    High Sensitivity Troponin:   Recent Labs  Lab 02/14/23 2152 02/15/23 0205 02/15/23 0653 02/15/23 1058 02/15/23 1859  TROPONINIHS 550* 535* 541* 458* 314*     Chemistry Recent Labs  Lab 02/15/23 0205 02/16/23 1039 02/17/23 0439 02/17/23 1604 02/18/23 0436 02/19/23 0636 02/20/23 0429  NA 137   < > 138   < > 139 140 139  K 4.4   < > 5.4*   < > 4.9 4.2 3.7  CL 103   < > 100   < > 100 95* 87*  CO2 25   < > 29   < > 30 34* 39*  GLUCOSE 111*   < > 149*   < > 143* 102* 111*  BUN 61*   < > 70*   < >  83* 74* 71*  CREATININE 2.79*   < > 3.19*   < > 3.33* 2.75* 2.38*  CALCIUM 8.2*   < > 8.9   < > 8.8* 9.0 9.3  MG 2.2  --  2.3  --   --   --   --   GFRNONAA 23*   < > 19*   < > 18* 23* 28*  ANIONGAP 9   < > 9   < > 9 11 13    < > = values in this interval not displayed.    Lipids  Recent Labs  Lab 02/15/23 0205  CHOL 131  TRIG 123  HDL 31*  LDLCALC 75  CHOLHDL 4.2    Hematology Recent Labs  Lab 02/16/23 0452 02/17/23 0439 02/20/23 0429  WBC 7.3 8.6 7.3  RBC 4.08* 4.24 4.33  HGB 11.6* 12.0* 12.2*  HCT 38.0* 39.0 39.7  MCV 93.1 92.0 91.7  MCH 28.4 28.3 28.2  MCHC 30.5 30.8 30.7  RDW 17.2* 17.1* 15.9*  PLT 183 206 198   Thyroid No results for input(s): "TSH", "FREET4" in the last 168 hours.  BNP Recent Labs  Lab 02/14/23 1642  BNP 627.9*    DDimer No results for input(s): "DDIMER" in the last 168 hours.   Radiology    No results found.  Cardiac Studies    Echo 02/2023 1. Left ventricular ejection fraction, by estimation, is 30 to 35%. The  left ventricle has moderately decreased function. Left ventricular  endocardial border  not optimally defined to evaluate regional wall motion.  The left ventricular internal cavity  size was mildly to moderately dilated. There is mild left ventricular  hypertrophy. Left ventricular diastolic parameters are consistent with  Grade II diastolic dysfunction (pseudonormalization). Elevated left atrial  pressure.   2. Right ventricular systolic function was not well visualized. The right  ventricular size is not well visualized.   3. Left atrial size was mildly dilated.   4. Right atrial size was moderately dilated.   5. The mitral valve was not well visualized. No evidence of mitral valve  regurgitation. No evidence of mitral stenosis.   6. The aortic valve has an indeterminant number of cusps. Aortic valve  regurgitation is trivial.   7. Pulmonic valve regurgitation not well-assessed.   Echo 10/2021   1. Left ventricular ejection fraction, by estimation, is 50 to 55%. The  left ventricle has low normal function. Select images concerning for  inferior wall hypokinesis. Left ventricular diastolic parameters are  consistent with Grade I diastolic  dysfunction (impaired relaxation).   2. Right ventricular systolic function is normal. The right ventricular  size is normal.   3. Left atrial size was mildly dilated.   4. The mitral valve was not well visualized. Mild mitral valve  regurgitation. No evidence of mitral stenosis.   5. The aortic valve was not well visualized. Aortic valve regurgitation  is mild. No aortic stenosis is present.   6. The inferior vena cava is normal in size with greater than 50%  respiratory variability, suggesting right atrial pressure of 3 mmHg.   Patient Profile     77 y.o. male with PMH CAD s/p CABG and PCI, chronic diastolic heart failure, chronic kidney disease stage 3b, type II diabetes presenting with acute on chronic heart failure, found to have newly reduced EF this admission.   Assessment & Plan   Acute systolic systolic heart  failure Acute on chronic hypoxic and hypercarbic respiratory failure - EF newly reduced  30-35% this admission - IV lasix 160mg  BID with metolazone PRN - Net -14.2L - Imdur 30mg  daily - kidney function continues to improve - continue with diuresis - add on GDMT as able  CAD  - s/p PCIs and CABG - occasional chest pain - HS trop 500-600 with flat trend - cath in 10/2020 showed new occlusion of SVG-OM and high grade stenosis of SVG-> LAD-> D2 requiring high-risk PCI and iABP support - continue ASA/statin - would ideally plan for r/l heart cath with drop in EF, but renal dysfunction limiting this. Can consider myoview lexiscan.  AKI on CKD stage 3 - Baseline 2.5 - Scr 3.33>2.75>2.38 - follows with nephrology as OP  For questions or updates, please contact [facility] Please consult [email or web address] for contact info under        Signed, Caidyn Henricksen David Stall, PA-C  02/20/2023, 10:27 AM

## 2023-02-21 DIAGNOSIS — R310 Gross hematuria: Secondary | ICD-10-CM | POA: Diagnosis not present

## 2023-02-21 DIAGNOSIS — I5033 Acute on chronic diastolic (congestive) heart failure: Secondary | ICD-10-CM | POA: Diagnosis not present

## 2023-02-21 LAB — BASIC METABOLIC PANEL WITH GFR
Anion gap: 16 — ABNORMAL HIGH (ref 5–15)
BUN: 76 mg/dL — ABNORMAL HIGH (ref 8–23)
CO2: 41 mmol/L — ABNORMAL HIGH (ref 22–32)
Calcium: 9.1 mg/dL (ref 8.9–10.3)
Chloride: 85 mmol/L — ABNORMAL LOW (ref 98–111)
Creatinine, Ser: 2.29 mg/dL — ABNORMAL HIGH (ref 0.61–1.24)
GFR, Estimated: 29 mL/min — ABNORMAL LOW
Glucose, Bld: 95 mg/dL (ref 70–99)
Potassium: 3.5 mmol/L (ref 3.5–5.1)
Sodium: 142 mmol/L (ref 135–145)

## 2023-02-21 LAB — MAGNESIUM: Magnesium: 2.4 mg/dL (ref 1.7–2.4)

## 2023-02-21 MED ORDER — SIMETHICONE 80 MG PO CHEW
80.0000 mg | CHEWABLE_TABLET | Freq: Once | ORAL | Status: AC
  Administered 2023-02-21: 80 mg via ORAL
  Filled 2023-02-21: qty 1

## 2023-02-21 MED ORDER — ACETAZOLAMIDE 250 MG PO TABS
500.0000 mg | ORAL_TABLET | Freq: Once | ORAL | Status: AC
  Administered 2023-02-21: 500 mg via ORAL
  Filled 2023-02-21: qty 2

## 2023-02-21 NOTE — Progress Notes (Addendum)
Advanced Heart Failure Rounding Note  PCP-Cardiologist: Debbe Odea, MD   Subjective:   Admitted with acute systolic heart failure.   Has been diuresing with IV lasix 160 mg BID. Overall down around 28lbs. SCr 2.38>2.29  Feels ok, a little groggy this morning. Got Seroquel last night, got good sleep and wore his CPAP all night. Wife at bedside.    Objective:   Weight Range: 131.2 kg Body mass index is 41.5 kg/m.   Vital Signs:   Temp:  [97.4 F (36.3 C)-97.5 F (36.4 C)] 97.4 F (36.3 C) (12/09 0743) Pulse Rate:  [59-94] 59 (12/09 0743) Resp:  [16-19] 17 (12/09 0743) BP: (117-150)/(49-76) 138/69 (12/09 0743) SpO2:  [92 %-99 %] 94 % (12/09 0743) Weight:  [131.2 kg] 131.2 kg (12/09 0500) Last BM Date : 02/20/23  Weight change: Filed Weights   02/18/23 0600 02/20/23 0500 02/21/23 0500  Weight: (!) 142.8 kg 133 kg 131.2 kg   Intake/Output:   Intake/Output Summary (Last 24 hours) at 02/21/2023 0826 Last data filed at 02/21/2023 0452 Gross per 24 hour  Intake 290 ml  Output 3600 ml  Net -3310 ml    Physical Exam  General:  drowsy appearing.  No respiratory difficulty HEENT: normal Neck: supple. JVD ~12 cm. Carotids 2+ bilat; no bruits. No lymphadenopathy or thyromegaly appreciated. Cor: PMI nondisplaced. Regular rate & rhythm. No rubs, gallops or murmurs. Lungs: clear, diminished bases Abdomen: obese, soft, nontender, nondistended. No hepatosplenomegaly. No bruits or masses. Good bowel sounds. Extremities: no cyanosis, clubbing, rash, trace BLE edema  Neuro: alert & oriented x 3, cranial nerves grossly intact. moves all 4 extremities w/o difficulty. Affect pleasant.   Telemetry   NSR 80s with PVCs (Personally reviewed)    EKG    No new EKG to review  Labs    CBC Recent Labs    02/20/23 0429  WBC 7.3  HGB 12.2*  HCT 39.7  MCV 91.7  PLT 198   Basic Metabolic Panel Recent Labs    81/19/14 0429 02/21/23 0422  NA 139 142  K 3.7 3.5  CL  87* 85*  CO2 39* 41*  GLUCOSE 111* 95  BUN 71* 76*  CREATININE 2.38* 2.29*  CALCIUM 9.3 9.1  MG  --  2.4   Liver Function Tests No results for input(s): "AST", "ALT", "ALKPHOS", "BILITOT", "PROT", "ALBUMIN" in the last 72 hours. No results for input(s): "LIPASE", "AMYLASE" in the last 72 hours. Cardiac Enzymes No results for input(s): "CKTOTAL", "CKMB", "CKMBINDEX", "TROPONINI" in the last 72 hours.  BNP: BNP (last 3 results) Recent Labs    02/14/23 1642  BNP 627.9*    ProBNP (last 3 results) No results for input(s): "PROBNP" in the last 8760 hours.   D-Dimer No results for input(s): "DDIMER" in the last 72 hours. Hemoglobin A1C No results for input(s): "HGBA1C" in the last 72 hours. Fasting Lipid Panel No results for input(s): "CHOL", "HDL", "LDLCALC", "TRIG", "CHOLHDL", "LDLDIRECT" in the last 72 hours. Thyroid Function Tests No results for input(s): "TSH", "T4TOTAL", "T3FREE", "THYROIDAB" in the last 72 hours.  Invalid input(s): "FREET3"  Other results:   Imaging    No results found.   Medications:     Scheduled Medications:  allopurinol  300 mg Oral Daily   aspirin EC  81 mg Oral Daily   Chlorhexidine Gluconate Cloth  6 each Topical Daily   cholecalciferol  4,000 Units Oral Daily   escitalopram  20 mg Oral Daily   ezetimibe  10  mg Oral Daily   heparin injection (subcutaneous)  5,000 Units Subcutaneous Q8H   isosorbide mononitrate  30 mg Oral Daily   lidocaine  2 patch Transdermal Q24H   loratadine  10 mg Oral Daily   pravastatin  40 mg Oral q1800   QUEtiapine  50 mg Oral QHS    Infusions:  furosemide 160 mg (02/21/23 0746)    PRN Medications: acetaminophen, albuterol, dextromethorphan-guaiFENesin, hydrALAZINE, nitroGLYCERIN, ondansetron (ZOFRAN) IV, mouth rinse, oxyCODONE, polyvinyl alcohol  Patient Profile   Tony Williams is a 77 y.o. male with a history of CAD status post four-vessel bypass in 2007 with subsequent interventions,  chronic HFpEF, hypertension, hyperlipidemia, statin intolerance, obesity, diabetes, remote tobacco abuse, COPD, sleep apnea (not using CPAP), and stage IIIB chronic kidney disease.   Assessment/Plan  1. Acute systolic HF - Echo this admit. Difficult images EF ~35% (not well seen) RV not seen well but does not appear to be significantly dilated - NYHA IV (multifactorial). Volume overloaded - Unclear if volume overloaded due primarily to HF or worsening renal function or both - Continue lasix 160 IV bid add diamox - Ideally would warrant R/L cath but renal dysfunction prohibitive for coronary angiography. Can consider Lexiscan Myoview prior to d/c - No ACE/ARB/ARNi or MRA with AKI/CKD - Eventual b-blocker - Possible SGLT2i depending on renal function    2. CAD  - s/p previous PCIs and CABG - has had occasional CPs. No evidence ACS hstrops flat 500-600 range - Last cath 8/22 with new occlusion of SVG-OM and high-grade stenosis of SVG -> LAD -> D2 requiring high-risk PCI and IABP support  - Continue ASA/statin - Ideally would warrant R/L cath with drop in EF but renal dysfunction prohibitive for coronary angiography. Can consider Lexiscan Myoview prior to d/c   3. Acute on chronic kidney injury CKD 3b at baseline - Baseline SCr ~2.5 - Improving 2.29 today - Suspect cardiorenal - Followed by Dr. Thedore Mins CKD   4. Acute on chronic hypoxic/hypercarbic respiratory failure - suspect primarily OSA/OHS but also HF - continue CPAP. Diurese - Needs weight loss   5. Metabolic encephalopathy - suspect combination of CO2 retention and possible early uremia - continue Bipap - watch renal function - check NH3   6. Morbid obesity -Body mass index is 41.5 kg/m.  - consider GLP1RA as outpatient   6. Severe debility - needs PT/OT - likely needs short-term rehab/SNF at d/c   7. DM2 - Per primary   Length of Stay: 7  Alen Bleacher, NP  02/21/2023, 8:26 AM  Advanced Heart Failure Team Pager  [phone] (M-F; 7a - 5p)  Please contact [facility] for night-coverage after hours (5p -7a ) and weekends on [email or web address]

## 2023-02-21 NOTE — Progress Notes (Signed)
Urology Inpatient Progress Note  Subjective: Urology was asked to revisit this patient due to development of gross hematuria 2 days ago.  Plavix is being held. UA yesterday with >50 RBCs/hpf, >50 WBCs/hpf, and many bacteria. No urine culture sent. Foley catheter in place draining clear, yellow urine today. He is asleep but accompanied by his wife at the bedside, who reports increased confusion/agitation over the past several days.  Anti-infectives: Anti-infectives (From admission, onward)    None       Current Facility-Administered Medications  Medication Dose Route Frequency Provider Last Rate Last Admin   acetaminophen (TYLENOL) tablet 650 mg  650 mg Oral Q6H PRN Lorretta Harp, MD   650 mg at 02/21/23 0837   acetaZOLAMIDE (DIAMOX) tablet 500 mg  500 mg Oral Once Brynda Peon L, NP       albuterol (PROVENTIL) (2.5 MG/3ML) 0.083% nebulizer solution 2.5 mg  2.5 mg Nebulization Q4H PRN Lorretta Harp, MD       allopurinol (ZYLOPRIM) tablet 300 mg  300 mg Oral Daily Lorretta Harp, MD   300 mg at 02/21/23 2130   aspirin EC tablet 81 mg  81 mg Oral Daily Lorretta Harp, MD   81 mg at 02/21/23 8657   Chlorhexidine Gluconate Cloth 2 % PADS 6 each  6 each Topical Daily Darlin Priestly, MD   6 each at 02/20/23 1729   cholecalciferol (VITAMIN D3) 25 MCG (1000 UNIT) tablet 4,000 Units  4,000 Units Oral Daily Lorretta Harp, MD   4,000 Units at 02/21/23 0837   dextromethorphan-guaiFENesin (MUCINEX DM) 30-600 MG per 12 hr tablet 1 tablet  1 tablet Oral BID PRN Lorretta Harp, MD       escitalopram (LEXAPRO) tablet 20 mg  20 mg Oral Daily Lorretta Harp, MD   20 mg at 02/21/23 8469   ezetimibe (ZETIA) tablet 10 mg  10 mg Oral Daily Creig Hines, NP   10 mg at 02/21/23 0837   furosemide (LASIX) 160 mg in dextrose 5 % 50 mL IVPB  160 mg Intravenous BID Bensimhon, Bevelyn Buckles, MD 66 mL/hr at 02/21/23 0746 160 mg at 02/21/23 0746   heparin injection 5,000 Units  5,000 Units Subcutaneous Q8H Darlin Priestly, MD   5,000 Units at  02/21/23 0526   isosorbide mononitrate (IMDUR) 24 hr tablet 30 mg  30 mg Oral Daily Lorretta Harp, MD   30 mg at 02/21/23 0837   lidocaine (LIDODERM) 5 % 2 patch  2 patch Transdermal Q24H Darlin Priestly, MD   2 patch at 02/20/23 1736   loratadine (CLARITIN) tablet 10 mg  10 mg Oral Daily Lorretta Harp, MD   10 mg at 02/21/23 0837   nitroGLYCERIN (NITROSTAT) SL tablet 0.4 mg  0.4 mg Sublingual Q5 min PRN Lorretta Harp, MD       ondansetron [facility]) injection 4 mg  4 mg Intravenous Q8H PRN Lorretta Harp, MD   4 mg at 02/20/23 1526   Oral care mouth rinse  15 mL Mouth Rinse PRN Manuela Schwartz, NP       oxyCODONE (Oxy IR/ROXICODONE) immediate release tablet 5 mg  5 mg Oral Q6H PRN Darlin Priestly, MD   5 mg at 02/19/23 2013   polyvinyl alcohol (LIQUIFILM TEARS) 1.4 % ophthalmic solution 1 drop  1 drop Both Eyes PRN Darlin Priestly, MD       pravastatin (PRAVACHOL) tablet 40 mg  40 mg Oral q1800 Darlin Priestly, MD   40 mg at 02/19/23 1732   QUEtiapine (SEROQUEL) tablet 50  mg  50 mg Oral QHS Darlin Priestly, MD   50 mg at 02/20/23 2019     Objective: Vital signs in last 24 hours: Temp:  [97.4 F (36.3 C)-97.5 F (36.4 C)] 97.4 F (36.3 C) (12/09 0743) Pulse Rate:  [62-85] 73 (12/09 0743) Resp:  [17-19] 17 (12/09 0743) BP: (117-150)/(49-76) 138/69 (12/09 0743) SpO2:  [92 %-99 %] 94 % (12/09 0743) Weight:  [131.2 kg] 131.2 kg (12/09 0500)  Intake/Output from previous day: 12/08 0701 - 12/09 0700 In: 290 [P.O.:240; IV Piggyback:50] Out: 3600 [Urine:3600] Intake/Output this shift: Total I/O In: 240 [P.O.:240] Out: 1125 [Urine:1125]  Physical Exam Vitals and nursing note reviewed.  Constitutional:      General: He is sleeping. He is not in acute distress.    Appearance: He is not ill-appearing, toxic-appearing or diaphoretic.  Pulmonary:     Effort: Pulmonary effort is normal. No respiratory distress.  Skin:    General: Skin is warm and dry.    Lab Results:  Recent Labs    02/20/23 0429  WBC 7.3  HGB 12.2*  HCT  39.7  PLT 198   BMET Recent Labs    02/20/23 0429 02/21/23 0422  NA 139 142  K 3.7 3.5  CL 87* 85*  CO2 39* 41*  GLUCOSE 111* 95  BUN 71* 76*  CREATININE 2.38* 2.29*  CALCIUM 9.3 9.1   Assessment & Plan: 77 year old male who developed urinary retention during admission with CHF.  Urologic history notable for AUS around 1997 after TURP around 1995.  Gross hematuria has resolved today with holding Plavix; differential includes Foley irritation versus traumatic Foley pull due to confusion versus acute cystitis with grossly positive UA yesterday.  At this point would recommend pursuing a voiding trial, as prolonged Foley catheterization could increase his risk for erosion of the artificial urinary sphincter.  If he fails voiding trial, would recommend placement of a suprapubic catheter; he can be I&O cathed in the interim if needed.  Would also recommend sending a urine culture and starting empiric antibiotics for acute cystitis.  Carman Ching, PA-C 02/21/2023

## 2023-02-21 NOTE — Progress Notes (Signed)
PROGRESS NOTE    Tony Williams  ZOX:[id number] DOB: March 05, 1946 DOA: 02/14/2023 PCP: Erasmo Downer, MD  [id number]  LOS: 7 days   Brief hospital course:   Assessment & Plan: 77 y.o. male with medical history significant of dCHF, HLD, HTN, DM, COPD on 3L oxygen, CAD (s/p of CABG and stent), gout, CKD-3b, depression with anxiety, morbid obesity, OSA, former smoker, bilateral cancer, who presents with SOB.   Patient states that he has shortness of breath in the past several weeks which has been progressively worsening.  Patient is dry cough, no fever or chills.  He had mild substernal chest pain earlier, which has resolved. Patient also has worsening bilateral lower leg edema and 15 pounds of weight gain recently.  Patient has nausea, no vomiting, diarrhea or abdominal pain.  No symptoms of UTI.  Patient states he has intermittent bilateral blurry vision for more than 3 weeks.  He reports mild numbness in both feet, and generalized weakness.  No unilateral weakness in extremities.  No facial droop or slurred speech.   Patient is normally using 3 L oxygen at baseline, but found to have acute respiratory distress, with oxygen desaturation to 82% and difficulty speaking in full sentence, currently requiring 6 L oxygen with 93% of saturation.   Acute on chronic respiratory failure with hypoxia due to acute on chronic diastolic CHF (congestive heart failure) (HCC): 2D echo on 10/29/2021 showed EF 50-55% with grade 1 diastolic dysfunction.  Patient has 3+ leg edema, 15 pounds weight gain, elevated BNP 627, clinically consistent with CHF exacerbation. --drop in ejection fraction down to 30% on echo down from 50% in 2023  --cardio consulted, diuretics increased yesterday, had nearly 5 L urine output, with improvement in Cr. Plan: --cont IV lasix 160 BID, per cardio - No ACE/ARB/ARNi or MRA with AKI/CKD - Eventual b-blocker - Possible SGLT2i depending on renal function   Hx of CAD (coronary  artery disease)  Trop elevation 2/2 demand ischemia --trop 609. Pt had mild chest pain earlier, which has resolved.  Patient is s/p of CABG and stent placement. - IV heparin is started by EDP Plan: --cont ASA  --hold plavix due to hematuria --cont statin - Ideally would warrant R/L cath with drop in EF but renal dysfunction prohibitive for coronary angiography. Can consider Lexiscan Myoview prior to d/c   Urinary retention --had bladder sphincter that urology had turned off, but still had retention.  Foley inserted on 12/4 with immediate return of 700 ml. --will perform voiding trial tomorrow, per urology rec  Hematuria --sudden onset on 12/7, no known trauma.  Light pink urine turned into gross red urine today. --plavix held, now urine turning clear  Bacteruria --UA obtained from Foley, so not suprisingly had bacteria present.  Pt denied urinary symptoms, no fever, no leukocytosis, and hematuria resolved with holding plavix (no abx on board).  No need to treat for UTI.  Hospital delirium --developed confusion and agitation night of 12/7. --cont seroquel 50 mg nightly (new)   Chronic obstructive pulmonary disease on 3L home O2 No evidence of acute exacerbation   Blurry vision, bilateral: Etiology is not clear. It has been going on for more than 3 weeks intermittently.  No unilateral weakness in extremities.  No facial droop or slurred speech.  I offered to do MRI, but the patient has claustrophobia, need open MRI.  Patient states that he wants to see eye doctor first before doing MRI. -Will get CT of head --> negative for acute intracranial  abnormalities. Plan: -Patient is on aspirin and statin -Fall precaution   Hypertension associated with diabetes (HCC) -IV hydralazine as needed --cont IV lasix, per cardio --cont Imdur --hold coreg, amlodipine and hydralazine due to hypotension   Hyperlipidemia associated with type 2 diabetes mellitus (HCC) --cont statin as pravastatin    Type II diabetes mellitus with renal manifestations (HCC):  Recent A1c 6.3, well-controlled.  Patient is taking glipizide at home. --d/c'ed BG checks and SSI since BG have been within goals   AKI Stage 3b chronic kidney disease (CKD) (HCC):  --Cr peaked at 3.41.  improved with diuresis --monitor Cr while diuresing   Chronic gout due to renal impairment without tophus --hold colchicine due to AKI --cont allopurinol   Depression with anxiety --cont Lexapro and trazodone   OSA (obstructive sleep apnea) --Ensure CPAP while sleeping   Morbid obesity (HCC): Body weight 142.9 kg, BMI 45.2 -Encourage losing weight -Exercise and healthy diet  Somnolence --noted to be very sleepy on 12/5, due to combination of not using CPAP night before, on home lyrica and gabapentin with doses too high for his kidney function, and having taken oxycodone. --need to ensure CPAP while sleeping --hold home Lyrica and gabapentin for now  Left knee osteoarthritis --pain limiting mobility.  Pt missed his scheduled outpatient ortho steroid injection with Dr. Ernest Pine due to hospitalization. --discussed with oncall ortho on 12/6, who rec against steroid injection while inpatient due to higher risk of infection. --lidocaine patch --oxycodone PRN    DVT prophylaxis: Heparin SQ Code Status: Full code  Family Communication:  Level of care: Telemetry Cardiac Dispo:   The patient is from: home Anticipated d/c is to: SNF rehab Anticipated d/c date is: to be determined   Subjective and Interval History:  Urine cleared up.  Pt had multiple BM's.  Still having good urine output.   Objective: Vitals:   02/21/23 0500 02/21/23 0743 02/21/23 1429 02/21/23 1500  BP:  138/69 (!) 148/76   Pulse:  73 81 77  Resp:  17 18 20   Temp:  (!) 97.4 F (36.3 C) 98.3 F (36.8 C)   TempSrc:  Oral    SpO2:  94% (!) 86% 92%  Weight: 131.2 kg     Height:        Intake/Output Summary (Last 24 hours) at 02/21/2023  1857 Last data filed at 02/21/2023 1830 Gross per 24 hour  Intake 530 ml  Output 3500 ml  Net -2970 ml   Filed Weights   02/18/23 0600 02/20/23 0500 02/21/23 0500  Weight: (!) 142.8 kg 133 kg 131.2 kg    Examination:   Constitutional: NAD HEENT: conjunctivae and lids normal, EOMI CV: No cyanosis.   RESP: normal respiratory effort SKIN: warm, dry  Foley present now with clear urine   Data Reviewed: I have personally reviewed labs and imaging studies  Time spent: 50 minutes  Darlin Priestly, MD [facility] If 7PM-7AM, please contact night-coverage 02/21/2023, 6:57 PM

## 2023-02-21 NOTE — Progress Notes (Addendum)
Physical Therapy Treatment Patient Details Name: Tony Williams MRN: [id number] DOB: 1946/02/25 Today's Date: 02/21/2023   History of Present Illness [name] is a 77 year old male with history of CAD status post CABG and subsequent PCI's, chronic HFpEF, hypertension, hyperlipidemia, type 2 diabetes mellitus, statin intolerance, COPD, CKD stage III, and obstructive sleep apnea, who presented with SOB as well as 3-week history of progressive lower extremity edema and shortness of breath associated with a 20 pound weight gain.    PT Comments  Pt awake but a bit lethargic.  Attentive wife in room.  He agrees to session.  Reports "all over discomfort".  He does do supine A/AAROM x 10 with cues to continue with ex.  Sats decrease to 85% with supine ex and RN in and increased O2 for activity.  He is able to get to EOB with mod encouragement and use or rails but no physical assist.  He keeps B hands braced on rail and footboard of bed for support and does c/o some increased dizziness.  Sats 95% with sitting. He is unable to progress mobility and after about 3 minutes stated he needed to return to bed.  He is able to reposition himself with rails.   O2 returned to baseline after session.    Pt overall continues with decreased tolerance for activity.  He continues to have good strength but limited by generally feeling poorly. Wife remains in room after session.  May be wise to take orthostatics next session   If plan is discharge home, recommend the following: Two people to help with walking and/or transfers;Two people to help with bathing/dressing/bathroom;Help with stairs or ramp for entrance;Assist for transportation;Assistance with cooking/housework   Can travel by private vehicle        Equipment Recommendations       Recommendations for Other Services       Precautions / Restrictions Precautions Precautions: Fall     Mobility  Bed Mobility Overal bed mobility: Needs  Assistance Bed Mobility: Rolling, Supine to Sit, Sit to Supine Rolling: Min assist, Used rails   Supine to sit: Min assist, HOB elevated, Used rails Sit to supine: HOB elevated, Min assist   General bed mobility comments: encouragement needed but does seem generally strong    Transfers                   General transfer comment: NT, pt unable to come to stand at EOB, limited by fatigue/dizziness    Ambulation/Gait                   Stairs             Wheelchair Mobility     Tilt Bed    Modified Rankin (Stroke Patients Only)       Balance Overall balance assessment: Needs assistance Sitting-balance support: Feet supported, Bilateral upper extremity supported Sitting balance-Leahy Scale: Fair Sitting balance - Comments: falling asleep sitting up CGA for safety throughout, does c/o some dizziness today throughout even when transitioned back to supine     Standing balance-Leahy Scale: Poor Standing balance comment: sits on his own but BUE support on rails and end of bed.  close supervision due to dizziness and malaise                            Cognition Arousal: Lethargic Behavior During Therapy: WFL for tasks assessed/performed Overall Cognitive Status: Within Functional Limits for tasks assessed  General Comments: wife answers lots of questions for him and very involved in session        Exercises Other Exercises Other Exercises: supine AAROM x 10    General Comments        Pertinent Vitals/Pain Pain Assessment Pain Assessment: Faces Faces Pain Scale: Hurts even more Pain Location: "all over" Pain Descriptors / Indicators: Sore, Discomfort Pain Intervention(s): Limited activity within patient's tolerance, Monitored during session, Repositioned    Home Living                          Prior Function            PT Goals (current goals can now be found in the  care plan section) Progress towards PT goals: Not progressing toward goals - comment    Frequency    Min 1X/week      PT Plan      Co-evaluation              AM-PAC PT "6 Clicks" Mobility   Outcome Measure  Help needed turning from your back to your side while in a flat bed without using bedrails?: A Little Help needed moving from lying on your back to sitting on the side of a flat bed without using bedrails?: A Lot Help needed moving to and from a bed to a chair (including a wheelchair)?: A Lot Help needed standing up from a chair using your arms (e.g., wheelchair or bedside chair)?: A Lot Help needed to walk in hospital room?: A Lot Help needed climbing 3-5 steps with a railing? : Total 6 Click Score: 12    End of Session Equipment Utilized During Treatment: Oxygen Activity Tolerance: Patient limited by fatigue;Other (comment) (generally feeling poorly) Patient left: in bed;with call bell/phone within reach;with bed alarm set;with family/visitor present Nurse Communication: Mobility status PT Visit Diagnosis: Other abnormalities of gait and mobility (R26.89);Muscle weakness (generalized) (M62.81);Difficulty in walking, not elsewhere classified (R26.2)     Time: 4132-4401 PT Time Calculation (min) (ACUTE ONLY): 15 min  Charges:    $Therapeutic Exercise: 8-22 mins PT General Charges $$ ACUTE PT VISIT: 1 Visit                   Danielle Dess, PTA 02/21/23, 10:08 AM

## 2023-02-22 DIAGNOSIS — I5033 Acute on chronic diastolic (congestive) heart failure: Secondary | ICD-10-CM | POA: Diagnosis not present

## 2023-02-22 LAB — BASIC METABOLIC PANEL
Anion gap: 20 — ABNORMAL HIGH (ref 5–15)
Anion gap: 19 — ABNORMAL HIGH (ref 5–15)
BUN: 77 mg/dL — ABNORMAL HIGH (ref 8–23)
BUN: 89 mg/dL — ABNORMAL HIGH (ref 8–23)
CO2: 38 mmol/L — ABNORMAL HIGH (ref 22–32)
CO2: 41 mmol/L — ABNORMAL HIGH (ref 22–32)
Calcium: 9.8 mg/dL (ref 8.9–10.3)
Calcium: 9.3 mg/dL (ref 8.9–10.3)
Chloride: 79 mmol/L — ABNORMAL LOW (ref 98–111)
Chloride: 79 mmol/L — ABNORMAL LOW (ref 98–111)
Creatinine, Ser: 2.82 mg/dL — ABNORMAL HIGH (ref 0.61–1.24)
Creatinine, Ser: 2.81 mg/dL — ABNORMAL HIGH (ref 0.61–1.24)
GFR, Estimated: 22 mL/min — ABNORMAL LOW (ref >60)
GFR, Estimated: 23 mL/min — ABNORMAL LOW (ref >60)
Glucose, Bld: 204 mg/dL — ABNORMAL HIGH (ref 70–99)
Glucose, Bld: 164 mg/dL — ABNORMAL HIGH (ref 70–99)
Potassium: 4 mmol/L (ref 3.5–5.1)
Potassium: 3.1 mmol/L — ABNORMAL LOW (ref 3.5–5.1)
Sodium: 137 mmol/L (ref 135–145)
Sodium: 139 mmol/L (ref 135–145)

## 2023-02-22 LAB — BLOOD GAS, ARTERIAL
Acid-Base Excess: 30.5 mmol/L — ABNORMAL HIGH (ref 0.0–2.0)
Bicarbonate: 60.3 mmol/L — ABNORMAL HIGH (ref 20.0–28.0)
O2 Content: 5 L/min
O2 Saturation: 95 %
Patient temperature: 37
pCO2 arterial: 81 mm[Hg] (ref 32–48)
pH, Arterial: 7.48 — ABNORMAL HIGH (ref 7.35–7.45)
pO2, Arterial: 69 mm[Hg] — ABNORMAL LOW (ref 83–108)

## 2023-02-22 LAB — MAGNESIUM: Magnesium: 2.5 mg/dL — ABNORMAL HIGH (ref 1.7–2.4)

## 2023-02-22 MED ORDER — QUETIAPINE FUMARATE 25 MG PO TABS
25.0000 mg | ORAL_TABLET | Freq: Every day | ORAL | Status: DC
  Administered 2023-02-22 – 2023-03-14 (×21): 25 mg via ORAL
  Filled 2023-02-22 (×21): qty 1

## 2023-02-22 MED ORDER — LIDOCAINE HCL URETHRAL/MUCOSAL 2 % EX GEL
1.0000 | Freq: Once | CUTANEOUS | Status: AC
  Administered 2023-02-23: 1 via URETHRAL
  Filled 2023-02-22: qty 5

## 2023-02-22 MED ORDER — STERILE WATER FOR INJECTION IJ SOLN
INTRAMUSCULAR | Status: AC
  Administered 2023-02-22: 5 mL
  Filled 2023-02-22: qty 10

## 2023-02-22 MED ORDER — ACETAZOLAMIDE SODIUM 500 MG IJ SOLR
500.0000 mg | Freq: Once | INTRAMUSCULAR | Status: AC
  Administered 2023-02-22: 500 mg via INTRAVENOUS
  Filled 2023-02-22: qty 500

## 2023-02-22 NOTE — Progress Notes (Addendum)
Occupational Therapy Treatment Patient Details Name: Tony Williams MRN: [id number] DOB: 1945-12-02 Today's Date: 02/22/2023   History of present illness [name] is a 77 year old male with history of CAD status post CABG and subsequent PCI's, chronic HFpEF, hypertension, hyperlipidemia, type 2 diabetes mellitus, statin intolerance, COPD, CKD stage III, and obstructive sleep apnea, who presented with SOB as well as 3-week history of progressive lower extremity edema and shortness of breath associated with a 20 pound weight gain.   OT comments  Tony Williams was seen for OT treatment on this date. Upon arrival to room pt supine in bed with CPAP donned, agreeable to Tx session. OT facilitated ADL management as described below. See ADL section for additional details regarding occupational performance. Pt continues to be functionally limited by generalized weakness, limited cognition, and decreased cardiopulmonary status. He requires increased time/effort to perform ADL management. Pt is progressing toward OT goals and continues to benefit from skilled OT services to maximize return to PLOF and minimize risk of future falls, injury, caregiver burden, and readmission. Will continue to follow POC as written. Discharge recommendation remains appropriate. Of note, pt left with all 4 bed rails up at spouse request to maximize independence with bed-level re-positioning this date. RN notified via secure chat.        If plan is discharge home, recommend the following:  A lot of help with walking and/or transfers;A lot of help with bathing/dressing/bathroom;Help with stairs or ramp for entrance;Direct supervision/assist for medications management;Assist for transportation;Assistance with cooking/housework   Equipment Recommendations  BSC/3in1    Recommendations for Other Services      Precautions / Restrictions Precautions Precautions: Fall Restrictions Weight Bearing Restrictions: No        Mobility Bed Mobility Overal bed mobility: Needs Assistance Bed Mobility: Supine to Sit, Sit to Supine Rolling: Used rails, Min assist   Supine to sit: HOB elevated, Min assist Sit to supine: HOB elevated, Supervision   General bed mobility comments: MIN A to boost himself up in bed.    Transfers Overall transfer level: Needs assistance                 General transfer comment: NT, pt unable to come to stand at EOB, limited by fatigue.     Balance Overall balance assessment: Needs assistance Sitting-balance support: Feet supported, Bilateral upper extremity supported Sitting balance-Leahy Scale: Fair Sitting balance - Comments: CGA for safety t/o pt c/o significant dizziness in sitting                                   ADL either performed or assessed with clinical judgement   ADL Overall ADL's : Needs assistance/impaired                                       General ADL Comments: Significant time/effort and MIN A to perform bed mobility. Once seated at EOB pt endorses significant fatigue and dizziness at EOB. Vitals monitored and pt - for orthostatics (138/69 supine; 139/78 seated EOB) He maintains a tripod position at baseline, and benefits from education on body positioning and energy conservation strategies for improved cardiopulmonary function. Pt declines further OOB/EOB activity. SUPERVISION for return to supine. Once back to bed he requires MAX A to don his CPAP.    Extremity/Trunk Assessment Upper Extremity Assessment Upper Extremity  Assessment: Generalized weakness   Lower Extremity Assessment Lower Extremity Assessment: Generalized weakness        Vision Baseline Vision/History: 1 Wears glasses Ability to See in Adequate Light: 0 Adequate Patient Visual Report: No change from baseline Vision Assessment?: Wears glasses for reading   Perception     Praxis      Cognition Arousal: Lethargic Behavior During Therapy:  WFL for tasks assessed/performed Overall Cognitive Status: Impaired/Different from baseline                                 General Comments: Per spouse at bedside, pt cognition not back to normal. Has experienced some confusion over last several days. Pulled his foely catheter out this AM. He is able to follow 1 step VCs with increased time during session.        Exercises Other Exercises Other Exercises: Pt/caregiver educated on importance of functional activity during hospital stay and reinforcement of education on energy conservation strategies including pursed lipped breathing technique and activity pacing.    Shoulder Instructions       General Comments      Pertinent Vitals/ Pain       Pain Assessment Pain Assessment: No/denies pain Pain Location: does not c/o pain but stated he feels "bad" Pain Descriptors / Indicators: Sore, Discomfort Pain Intervention(s): Limited activity within patient's tolerance, Monitored during session  Home Living Family/patient expects to be discharged to:: Private residence Living Arrangements: Spouse/significant other Available Help at Discharge: Family;Available 24 hours/day Type of Home: House Home Access: Stairs to enter [employer] of Steps: at garage, 1 Entrance Stairs-Rails: None Home Layout: One level     Bathroom Shower/Tub: Walk-in shower         Home Equipment: Agricultural consultant (2 wheels);Grab bars - tub/shower;Shower seat - built in          Prior Functioning/Environment              Frequency  Min 1X/week        Progress Toward Goals  OT Goals(current goals can now be found in the care plan section)  Progress towards OT goals: Progressing toward goals  Acute Rehab OT Goals OT Goal Formulation: With patient/family Time For Goal Achievement: 03/08/23 Potential to Achieve Goals: Good  Plan      Co-evaluation                 AM-PAC OT "6 Clicks" Daily Activity      Outcome Measure   Help from another person eating meals?: A Little Help from another person taking care of personal grooming?: A Little Help from another person toileting, which includes using toliet, bedpan, or urinal?: A Lot Help from another person bathing (including washing, rinsing, drying)?: A Lot Help from another person to put on and taking off regular upper body clothing?: A Lot Help from another person to put on and taking off regular lower body clothing?: A Lot 6 Click Score: 14    End of Session Equipment Utilized During Treatment: Rolling walker (2 wheels)  OT Visit Diagnosis: Other abnormalities of gait and mobility (R26.89);Muscle weakness (generalized) (M62.81)   Activity Tolerance Patient limited by fatigue   Patient Left in bed;with call bell/phone within reach;with bed alarm set;with family/visitor present   Nurse Communication          Time: 0254-2706 OT Time Calculation (min): 8 min  Charges: OT General Charges $OT Visit: 1 Visit  OT Treatments $Self Care/Home Management : 8-22 mins  Rockney Ghee, M.S., OTR/L 02/22/23, 11:56 AM

## 2023-02-22 NOTE — Progress Notes (Signed)
   Nurse paged and stated that patient removed his Foley overnight.  Bedside assessment showed that patient had some blood in the urine on meatus.  Nurse instructed to clean patient and leave Foley out.  Nurse to perform bladder scan to ensure patient bladder is emptying and if warranted perform an In-N-Out.  Review of the notes showed that urology would prefer patient to be without a Foley catheter at this time and recommend possibly putting in a suprapubic catheter due to risk of patient pulling of Foley catheter out.  Will send an H&H.  Consider following up with urology this a.m. to see if patient meets criteria for suprapubic catheter.  Louise Victory Lamin Geradine Girt, MSN, APRN, AGACNP-BC [facility] Pager: [phone]. Check Amion for Availability

## 2023-02-22 NOTE — Progress Notes (Signed)
Advanced Heart Failure Rounding Note  PCP-Cardiologist: Debbe Odea, MD   Subjective:   Admitted with acute systolic heart failure.   Has been diuresing with IV lasix 160 mg BID and metolazone. Suspect weight inaccurate this morning. SCr 2.38>2.29> 2.82. -3L UOP +unmeasured occurrence.   Pulled out his foley yesterday.   Resting on BiPAP this morning. Wife at bedside.   Objective:   Weight Range: 124.4 kg Body mass index is 39.35 kg/m.   Vital Signs:   Temp:  [97.6 F (36.4 C)-98.3 F (36.8 C)] 97.6 F (36.4 C) (12/10 0834) Pulse Rate:  [45-84] 84 (12/10 0834) Resp:  [16-20] 16 (12/10 0834) BP: (113-174)/(50-76) 113/58 (12/10 0834) SpO2:  [86 %-95 %] 90 % (12/10 0834) Weight:  [124.4 kg] 124.4 kg (12/10 0500) Last BM Date : 02/21/23  Weight change: Filed Weights   02/20/23 0500 02/21/23 0500 02/22/23 0500  Weight: 133 kg 131.2 kg 124.4 kg   Intake/Output:   Intake/Output Summary (Last 24 hours) at 02/22/2023 0904 Last data filed at 02/21/2023 2200 Gross per 24 hour  Intake 600 ml  Output 3051 ml  Net -2451 ml    Physical Exam  General:  elderly appearing.  No respiratory difficulty HEENT: +BiPAP Neck: supple. JVD ~6 cm. Carotids 2+ bilat; no bruits. No lymphadenopathy or thyromegaly appreciated. Cor: PMI nondisplaced. Regular rate & rhythm. No rubs, gallops or murmurs. Lungs: diminished bases Abdomen: soft, nontender, nondistended. No hepatosplenomegaly. No bruits or masses. Good bowel sounds. Extremities: no cyanosis, clubbing, rash, edema  Neuro: alert & oriented x 3, cranial nerves grossly intact. moves all 4 extremities w/o difficulty. Affect pleasant.   Telemetry   NSR 70s-80s with PVCs (Personally reviewed)   EKG    No new EKG to review  Labs    CBC Recent Labs    02/20/23 0429  WBC 7.3  HGB 12.2*  HCT 39.7  MCV 91.7  PLT 198   Basic Metabolic Panel Recent Labs    13/24/40 0422 02/22/23 0330  NA 142 137  K 3.5 4.0  CL  85* 79*  CO2 41* 38*  GLUCOSE 95 204*  BUN 76* 77*  CREATININE 2.29* 2.82*  CALCIUM 9.1 9.8  MG 2.4 2.5*   Liver Function Tests No results for input(s): "AST", "ALT", "ALKPHOS", "BILITOT", "PROT", "ALBUMIN" in the last 72 hours. No results for input(s): "LIPASE", "AMYLASE" in the last 72 hours. Cardiac Enzymes No results for input(s): "CKTOTAL", "CKMB", "CKMBINDEX", "TROPONINI" in the last 72 hours.  BNP: BNP (last 3 results) Recent Labs    02/14/23 1642  BNP 627.9*    ProBNP (last 3 results) No results for input(s): "PROBNP" in the last 8760 hours.   D-Dimer No results for input(s): "DDIMER" in the last 72 hours. Hemoglobin A1C No results for input(s): "HGBA1C" in the last 72 hours. Fasting Lipid Panel No results for input(s): "CHOL", "HDL", "LDLCALC", "TRIG", "CHOLHDL", "LDLDIRECT" in the last 72 hours. Thyroid Function Tests No results for input(s): "TSH", "T4TOTAL", "T3FREE", "THYROIDAB" in the last 72 hours.  Invalid input(s): "FREET3"  Other results:  Imaging  No results found. Medications:   Scheduled Medications:  allopurinol  300 mg Oral Daily   aspirin EC  81 mg Oral Daily   Chlorhexidine Gluconate Cloth  6 each Topical Daily   cholecalciferol  4,000 Units Oral Daily   escitalopram  20 mg Oral Daily   ezetimibe  10 mg Oral Daily   heparin injection (subcutaneous)  5,000 Units Subcutaneous Q8H  isosorbide mononitrate  30 mg Oral Daily   lidocaine  2 patch Transdermal Q24H   loratadine  10 mg Oral Daily   pravastatin  40 mg Oral q1800   QUEtiapine  50 mg Oral QHS    Infusions:    PRN Medications: acetaminophen, albuterol, dextromethorphan-guaiFENesin, nitroGLYCERIN, ondansetron (ZOFRAN) IV, mouth rinse, oxyCODONE, polyvinyl alcohol  Patient Profile   Tony Williams is a 77 y.o. male with a history of CAD status post four-vessel bypass in 2007 with subsequent interventions, chronic HFpEF, hypertension, hyperlipidemia, statin intolerance,  obesity, diabetes, remote tobacco abuse, COPD, sleep apnea (not using CPAP), and stage IIIB chronic kidney disease.   Assessment/Plan  1. Acute systolic HF - Echo this admit. Difficult images EF ~35% (not well seen) RV not seen well but does not appear to be significantly dilated - NYHA IV (multifactorial). Volume overloaded - Unclear if volume overloaded due primarily to HF or worsening renal function or both - Hold diuretics today. Appears euvolemic and bump in Scr - Ideally would warrant R/L cath but renal dysfunction prohibitive for coronary angiography. Can consider Lexiscan Myoview prior to d/c - No ACE/ARB/ARNi or MRA with AKI/CKD - Eventual b-blocker - Possible SGLT2i depending on renal function    2. CAD  - s/p previous PCIs and CABG - has had occasional CPs. No evidence ACS hstrops flat 500-600 range - Last cath 8/22 with new occlusion of SVG-OM and high-grade stenosis of SVG -> LAD -> D2 requiring high-risk PCI and IABP support  - Continue ASA/statin - Ideally would warrant R/L cath with drop in EF but renal dysfunction prohibitive for coronary angiography. Can consider Lexiscan Myoview prior to d/c   3. Acute on chronic kidney injury CKD 3b at baseline - Baseline SCr ~2.5 - Worst today 2.29>2.82 today - Holding diuretics as above - Suspect cardiorenal - Followed by Dr. Thedore Mins CKD   4. Acute on chronic hypoxic/hypercarbic respiratory failure - suspect primarily OSA/OHS but also HF - continue CPAP. Diurese - Needs weight loss   5. Metabolic encephalopathy - suspect combination of CO2 retention and possible early uremia - continue Bipap - watch renal function   6. Morbid obesity -Body mass index is 39.35 kg/m.  - consider GLP1RA as outpatient   6. Severe debility - needs PT/OT - likely needs short-term rehab/SNF at d/c   7. DM2 - Per primary   Length of Stay: 8  Alen Bleacher, NP  02/22/2023, 9:04 AM  Advanced Heart Failure Team Pager [phone] (M-F; 7a -  5p)  Please contact [facility] for night-coverage after hours (5p -7a ) and weekends on [email or web address]

## 2023-02-22 NOTE — Evaluation (Signed)
Occupational Therapy Evaluation Patient Details Name: Tony Williams MRN: [id number] DOB: June 15, 1945 Today's Date: 02/17/23    History of Present Illness Tony Williams is a 77 year old male with history of CAD status post CABG and subsequent PCI's, chronic HFpEF, hypertension, hyperlipidemia, type 2 diabetes mellitus, statin intolerance, COPD, CKD stage III, and obstructive sleep apnea, who presented with SOB as well as 3-week history of progressive lower extremity edema and shortness of breath associated with a 20 pound weight gain.   Clinical Impression   Tony Williams was seen for OT evaluation this date. Prior to hospital admission, pt was generally independent for ADL management. Pt lives with his supportive spouse in a 1 level home. Pt presents to acute OT demonstrating impaired ADL performance and functional mobility 2/2 generalized weakness, decreased activity tolerance, & limited cardiopulmonary status (See OT problem list for additional functional deficits). Pt currently requires MOD A for LB ADL management from STS, SET UP/SUP for seated UB ADL management.  Pt would benefit from skilled OT services to address noted impairments and functional limitations (see below for any additional details) in order to maximize safety and independence while minimizing falls risk and caregiver burden. Anticipate the need for follow up OT services upon acute hospital DC.        If plan is discharge home, recommend the following: A lot of help with walking and/or transfers;A lot of help with bathing/dressing/bathroom;Help with stairs or ramp for entrance;Direct supervision/assist for medications management;Assist for transportation;Assistance with cooking/housework    Functional Status Assessment     Equipment Recommendations       Recommendations for Other Services       Precautions / Restrictions Precautions Precautions: Fall Restrictions Weight Bearing Restrictions: No       Mobility Bed Mobility Overal bed mobility: Needs Assistance Bed Mobility: Supine to Sit, Sit to Supine     Supine to sit: HOB elevated, Modified independent (Device/Increase time) Sit to supine: Modified independent (Device/Increase time), HOB elevated   General bed mobility comments: significant increased time/effort to perform    Transfers                   General transfer comment: NT, pt unable to come to stand at EOB, limited by fatigue.      Balance                                           ADL either performed or assessed with clinical judgement   ADL Overall ADL's : Needs assistance/impaired                                       General ADL Comments: SUPERVISION with increased time/effort to come to sitting at EOB, pt initally unstable in setting with BUE tremors appreciated. Pt reportse these are new since admission. Pt requires MOD A to maintain static sitting balance. Improves to to suppervirion level as session progresses with BUE support on RW while seated EOB. Has difficulty maintaining upright positioning without BUE support. Attempt x1 to stand from EOB, however pt endorses increased dizziness and significant fatigue after ~5 min at EOB. He is able to lateral scoot toward [facility] with cueing for hand placement.     Vision Baseline Vision/History: 1 Wears glasses Ability to See in Adequate Light: 0  Adequate Patient Visual Report: No change from baseline Vision Assessment?: Wears glasses for reading     Perception         Praxis         Pertinent Vitals/Pain       Extremity/Trunk Assessment             Communication Communication Cueing Techniques: Verbal cues;Visual cues   Cognition                                             General Comments       Exercises Other Exercises Other Exercises: Pt educated on falls prevention strategies, safe use of AE/DME for ADL management, energy  conservation strategies including pursed lipped breathing technique and activity pacing. Limited evidence of carryover noted, would benefit from further reinforcement.   Shoulder Instructions      Home Living Family/patient expects to be discharged to:: Private residence Living Arrangements: Spouse/significant other Available Help at Discharge: Family;Available 24 hours/day Type of Home: House Home Access: Stairs to enter [employer] of Steps: at garage, 1 Entrance Stairs-Rails: None Home Layout: One level     Bathroom Shower/Tub: Walk-in shower         Home Equipment: Agricultural consultant (2 wheels);Grab bars - tub/shower;Shower seat - built in          Prior Functioning/Environment Prior Level of Function : Independent/Modified Independent             Mobility Comments: [place] walker for last few weeks, but normally independent for HH distances ADLs Comments: wife helps assists with bathing, occasional LB dressing, 2/2 baseline knee issues. Has required increased assistance the last 2-3 weeks.        OT Problem List: Decreased strength;Decreased knowledge of use of DME or AE;Increased edema;Decreased range of motion;Decreased activity tolerance;Cardiopulmonary status limiting activity;Decreased coordination;Decreased safety awareness;Impaired balance (sitting and/or standing)      OT Treatment/Interventions: Self-care/ADL training;Therapeutic exercise;Therapeutic activities;Energy conservation;DME and/or AE instruction;Patient/family education;Balance training    OT Goals(Current goals can be found in the care plan section) Acute Rehab OT Goals OT Goal Formulation: With patient/family Time For Goal Achievement: 03/08/23 Potential to Achieve Goals: Good  OT Frequency: Min 1X/week    Co-evaluation              AM-PAC OT "6 Clicks" Daily Activity     Outcome Measure Help from another person eating meals?: None Help from another person taking care of  personal grooming?: A Little Help from another person toileting, which includes using toliet, bedpan, or urinal?: A Lot Help from another person bathing (including washing, rinsing, drying)?: A Lot Help from another person to put on and taking off regular upper body clothing?: A Little Help from another person to put on and taking off regular lower body clothing?: A Lot 6 Click Score: 16   End of Session Equipment Utilized During Treatment: Rolling walker (2 wheels)  Activity Tolerance: Patient tolerated treatment well Patient left: in bed;with call bell/phone within reach;with bed alarm set;with family/visitor present  OT Visit Diagnosis: Other abnormalities of gait and mobility (R26.89);Muscle weakness (generalized) (M62.81)                Time:  -    Charges:     Rockney Ghee, M.S., OTR/L 02/22/23, 11:29 AM Late entry note due to incomplete documentation on date of service.

## 2023-02-22 NOTE — Plan of Care (Signed)
  Problem: Education: Goal: Ability to describe self-care measures that may prevent or decrease complications (Diabetes Survival Skills Education) will improve Outcome: Progressing Goal: Individualized Educational Video(s) Outcome: Progressing   Problem: Coping: Goal: Ability to adjust to condition or change in health will improve Outcome: Progressing   Problem: Fluid Volume: Goal: Ability to maintain a balanced intake and output will improve Outcome: Progressing   Problem: Health Behavior/Discharge Planning: Goal: Ability to identify and utilize available resources and services will improve Outcome: Progressing Goal: Ability to manage health-related needs will improve Outcome: Progressing   Problem: Metabolic: Goal: Ability to maintain appropriate glucose levels will improve Outcome: Progressing   Problem: Nutritional: Goal: Maintenance of adequate nutrition will improve Outcome: Progressing Goal: Progress toward achieving an optimal weight will improve Outcome: Progressing   Problem: Skin Integrity: Goal: Risk for impaired skin integrity will decrease Outcome: Progressing   Problem: Tissue Perfusion: Goal: Adequacy of tissue perfusion will improve Outcome: Progressing   Problem: Education: Goal: Ability to demonstrate management of disease process will improve Outcome: Progressing Goal: Ability to verbalize understanding of medication therapies will improve Outcome: Progressing Goal: Individualized Educational Video(s) Outcome: Progressing   Problem: Activity: Goal: Capacity to carry out activities will improve Outcome: Progressing   Problem: Cardiac: Goal: Ability to achieve and maintain adequate cardiopulmonary perfusion will improve Outcome: Progressing   Problem: Education: Goal: Knowledge of General Education information will improve Description: Including pain rating scale, medication(s)/side effects and non-pharmacologic comfort measures Outcome:  Progressing   Problem: Health Behavior/Discharge Planning: Goal: Ability to manage health-related needs will improve Outcome: Progressing   Problem: Clinical Measurements: Goal: Ability to maintain clinical measurements within normal limits will improve Outcome: Progressing Goal: Will remain free from infection Outcome: Progressing Goal: Diagnostic test results will improve Outcome: Progressing Goal: Respiratory complications will improve Outcome: Progressing Goal: Cardiovascular complication will be avoided Outcome: Progressing   Problem: Activity: Goal: Risk for activity intolerance will decrease Outcome: Progressing   Problem: Nutrition: Goal: Adequate nutrition will be maintained Outcome: Progressing   Problem: Coping: Goal: Level of anxiety will decrease Outcome: Progressing   Problem: Elimination: Goal: Will not experience complications related to bowel motility Outcome: Progressing Goal: Will not experience complications related to urinary retention Outcome: Progressing   Problem: Pain Management: Goal: General experience of comfort will improve Outcome: Progressing   Problem: Safety: Goal: Ability to remain free from injury will improve Outcome: Progressing   Problem: Skin Integrity: Goal: Risk for impaired skin integrity will decrease Outcome: Progressing

## 2023-02-22 NOTE — Progress Notes (Signed)
PROGRESS NOTE    Tony Williams  VHQ:[id number] DOB: April 07, 1945 DOA: 02/14/2023 PCP: Erasmo Downer, MD  [id number]  LOS: 8 days   Brief hospital course:   Assessment & Plan: 77 y.o. male with medical history significant of dCHF, HTN, DM, COPD on 3L oxygen, CAD (s/p of CABG and stent), CKD-3b, morbid obesity, OSA on CPAP, bladder cancer, who presented with SOB.   Patient states that he has shortness of breath in the past several weeks which has been progressively worsening.  Patient also has worsening bilateral lower leg edema and 15 pounds of weight gain recently.     Patient is normally using 3 L oxygen at baseline, but found to have acute respiratory distress, with oxygen desaturation to 82% and difficulty speaking in full sentence, on presentation requiring 6 L oxygen with 93% of saturation.   Acute on chronic respiratory failure with hypoxia due to acute on chronic diastolic CHF (congestive heart failure) (HCC): 2D echo on 10/29/2021 showed EF 50-55% with grade 1 diastolic dysfunction.  Patient has 3+ leg edema, 15 pounds weight gain, elevated BNP 627, clinically consistent with CHF exacerbation. --drop in ejection fraction down to 30% on echo down from 50% in 2023  --cardio consulted, pt received IV lasix 160 BID with metolazone PRN with good diuresis and improved Cr. Plan: --hold diuretic today due to Cr bump - No ACE/ARB/ARNi or MRA with AKI/CKD - Eventual b-blocker - Possible SGLT2i depending on renal function   Contraction alkalosis Acute on chronic hypercapnia  --from aggressive diuresis.  serum bicarb has become elevated.  ABG today with pH 7.48 even though pCO2 81.  Maybe respiratory compensation.   --IV Diamox 500 mg x1 today, per cardio  Hx of CAD (coronary artery disease)  Trop elevation 2/2 demand ischemia --trop 609. Pt had mild chest pain earlier, which has resolved.  Patient is s/p of CABG and stent placement. - IV heparin is started by  EDP Plan: --cont ASA  --hold plavix due to initially hematuria and now bleeding from pulling out Foley --cont statin - Ideally would warrant R/L cath with drop in EF but renal dysfunction prohibitive for coronary angiography. Can consider Lexiscan Myoview prior to d/c   Urinary retention --had bladder sphincter that urology had turned off, but still had retention.  Foley inserted on 12/4 with immediate return of 700 ml. --Pt pulled out Foley early morning of 12/10. --bladder scan q6h for 1 day for voiding trial. --Urology following  Hematuria --sudden onset on 12/7, no known trauma, though could be Foley irritation or pull.  Light pink urine turned into gross red urine.  Plavix held and urine cleared by 12/9. --unfortunately, pt pulled out Foley by force, so there was now real trauma sustained. --cont to hold plavix until bleeding stops  Bacteruria --UA obtained from Foley when pt had hematuria, so not suprisingly had bacteria present.  Pt denied urinary symptoms, no fever, no leukocytosis, and hematuria resolved with holding plavix (no abx on board).  No need to treat for UTI.  Hospital delirium --developed confusion and agitation night of 12/7. --cont seroquel 25 mg nightly (new)   Chronic obstructive pulmonary disease on 3L home O2 No evidence of acute exacerbation   Blurry vision, bilateral: Etiology is not clear. It has been going on for more than 3 weeks intermittently.  No unilateral weakness in extremities.  No facial droop or slurred speech.  I offered to do MRI, but the patient has claustrophobia, need open MRI.  Patient states  that he wants to see eye doctor first before doing MRI. -Will get CT of head --> negative for acute intracranial abnormalities. Plan: -Patient is on aspirin and statin -Fall precaution   Hypertension associated with diabetes (HCC) -IV hydralazine as needed --cont IV lasix, per cardio --cont Imdur --hold coreg, amlodipine and hydralazine due to  hypotension   Hyperlipidemia associated with type 2 diabetes mellitus (HCC) --cont statin as pravastatin   Type II diabetes mellitus with renal manifestations (HCC):  Recent A1c 6.3, well-controlled.  Patient is taking glipizide at home. --d/c'ed BG checks and SSI since BG have been within goals   AKI Stage 3b chronic kidney disease (CKD) (HCC):  --Cr peaked at 3.41.  improved with diuresis --monitor Cr while diuresing   Chronic gout due to renal impairment without tophus --hold colchicine due to AKI --cont allopurinol   Depression with anxiety --cont Lexapro and trazodone   OSA (obstructive sleep apnea) --Ensure CPAP while sleeping   Morbid obesity (HCC): Body weight 142.9 kg, BMI 45.2 -Encourage losing weight -Exercise and healthy diet  Somnolence --noted to be very sleepy on 12/5, due to combination of not using CPAP night before, on home lyrica and gabapentin with doses too high for his kidney function, and having taken oxycodone. --need to ensure CPAP while sleeping --hold home Lyrica and gabapentin for now  Left knee osteoarthritis --pain limiting mobility.  Pt missed his scheduled outpatient ortho steroid injection with Dr. Ernest Pine due to hospitalization. --discussed with oncall ortho on 12/6, who rec against steroid injection while inpatient due to higher risk of infection. --lidocaine patch --oxycodone PRN    DVT prophylaxis: Heparin SQ Code Status: Full code  Family Communication: wife updated at bedside today Level of care: Telemetry Cardiac Dispo:   The patient is from: home Anticipated d/c is to: SNF rehab Anticipated d/c date is: to be determined.     Subjective and Interval History:  Pt pulled his his Foley overnight.  Had blood from the meatus.    Continued to be lethargic today, but wife said pt was more coherent when awake.  ABG showed pCO2 81, however, pH at 7.48.   Bladder scan showing 0 throughout the day.   Objective: Vitals:   02/22/23  0022 02/22/23 0500 02/22/23 0535 02/22/23 0834  BP: (!) 174/74  (!) 130/50 (!) 113/58  Pulse: 76  (!) 45 84  Resp: 18  20 16   Temp: 98.3 F (36.8 C)  98.3 F (36.8 C) 97.6 F (36.4 C)  TempSrc:    Oral  SpO2: 92%  95% 90%  Weight:  124.4 kg    Height:        Intake/Output Summary (Last 24 hours) at 02/22/2023 1953 Last data filed at 02/21/2023 2200 Gross per 24 hour  Intake 360 ml  Output 751 ml  Net -391 ml   Filed Weights   02/20/23 0500 02/21/23 0500 02/22/23 0500  Weight: 133 kg 131.2 kg 124.4 kg    Examination:   Constitutional: NAD, lethargic, but arousable HEENT: conjunctivae and lids normal, EOMI CV: No cyanosis.   RESP: normal respiratory effort SKIN: warm, dry   Data Reviewed: I have personally reviewed labs and imaging studies  Time spent: 50 minutes  Darlin Priestly, MD [facility] If 7PM-7AM, please contact night-coverage 02/22/2023, 7:53 PM

## 2023-02-22 NOTE — Progress Notes (Signed)
Physical Therapy Treatment Patient Details Name: Tony Williams MRN: [id number] DOB: May 03, 1945 Today's Date: 02/22/2023   History of Present Illness [name] is a 77 year old male with history of CAD status post CABG and subsequent PCI's, chronic HFpEF, hypertension, hyperlipidemia, type 2 diabetes mellitus, statin intolerance, COPD, CKD stage III, and obstructive sleep apnea, who presented with SOB as well as 3-week history of progressive lower extremity edema and shortness of breath associated with a 20 pound weight gain.    PT Comments  Pt is able to get to EOB with encouragement and light min a x 1.  Stays sitting EOB about 5 minutes with close supervision due to forward lean but is generally stable.  He continues to c/o dizziness but BP's are stable during session with supine and sitting.  He does not feel he can attempt standing or laterally scooting in sitting to reposition.  Returns to supine and is able to help pull himself up in bed.  General malaise and motivation seems to be primary barrier for progression of mobility.   If plan is discharge home, recommend the following: Two people to help with walking and/or transfers;Two people to help with bathing/dressing/bathroom;Help with stairs or ramp for entrance;Assist for transportation;Assistance with cooking/housework   Can travel by private vehicle        Equipment Recommendations       Recommendations for Other Services       Precautions / Restrictions Precautions Precautions: Fall Restrictions Weight Bearing Restrictions: No     Mobility  Bed Mobility Overal bed mobility: Needs Assistance Bed Mobility: Rolling, Supine to Sit, Sit to Supine Rolling: Min assist, Used rails   Supine to sit: Min assist, HOB elevated, Used rails Sit to supine: HOB elevated, Min assist   General bed mobility comments: encouragement needed but does seem generally strong Patient Response: Cooperative, Flat  affect  Transfers                   General transfer comment: declined fue to malaise    Ambulation/Gait                   Stairs             Wheelchair Mobility     Tilt Bed Tilt Bed Patient Response: Cooperative, Flat affect  Modified Rankin (Stroke Patients Only)       Balance Overall balance assessment: Needs assistance Sitting-balance support: Feet supported, Bilateral upper extremity supported Sitting balance-Leahy Scale: Fair                                      Cognition Arousal: Lethargic Behavior During Therapy: WFL for tasks assessed/performed Overall Cognitive Status: Within Functional Limits for tasks assessed                                          Exercises      General Comments        Pertinent Vitals/Pain Pain Assessment Pain Assessment: No/denies pain Pain Location: does not c/o pain but stated he feels "bad" Pain Descriptors / Indicators: Sore, Discomfort Pain Intervention(s): Limited activity within patient's tolerance, Monitored during session, Repositioned    Home Living  Prior Function            PT Goals (current goals can now be found in the care plan section) Progress towards PT goals: Not progressing toward goals - comment    Frequency    Min 1X/week      PT Plan      Co-evaluation              AM-PAC PT "6 Clicks" Mobility   Outcome Measure  Help needed turning from your back to your side while in a flat bed without using bedrails?: A Little Help needed moving from lying on your back to sitting on the side of a flat bed without using bedrails?: A Lot Help needed moving to and from a bed to a chair (including a wheelchair)?: A Lot Help needed standing up from a chair using your arms (e.g., wheelchair or bedside chair)?: A Lot Help needed to walk in hospital room?: Total Help needed climbing 3-5 steps with a railing?  : Total 6 Click Score: 11    End of Session Equipment Utilized During Treatment: Oxygen Activity Tolerance: Patient limited by fatigue;Other (comment) (generally feeling poorly) Patient left: in bed;with call bell/phone within reach;with bed alarm set;with family/visitor present Nurse Communication: Mobility status PT Visit Diagnosis: Other abnormalities of gait and mobility (R26.89);Muscle weakness (generalized) (M62.81);Difficulty in walking, not elsewhere classified (R26.2)     Time: 2130-8657 PT Time Calculation (min) (ACUTE ONLY): 8 min  Charges:    $Therapeutic Activity: 8-22 mins PT General Charges $$ ACUTE PT VISIT: 1 Visit                   Danielle Dess, PTA 02/22/23, 10:52 AM

## 2023-02-23 DIAGNOSIS — I5033 Acute on chronic diastolic (congestive) heart failure: Secondary | ICD-10-CM | POA: Diagnosis not present

## 2023-02-23 LAB — CBC
HCT: 41.4 % (ref 39.0–52.0)
Hemoglobin: 12.9 g/dL — ABNORMAL LOW (ref 13.0–17.0)
MCH: 28.3 pg (ref 26.0–34.0)
MCHC: 31.2 g/dL (ref 30.0–36.0)
MCV: 90.8 fL (ref 80.0–100.0)
Platelets: 199 10*3/uL (ref 150–400)
RBC: 4.56 MIL/uL (ref 4.22–5.81)
RDW: 16.1 % — ABNORMAL HIGH (ref 11.5–15.5)
WBC: 9.3 10*3/uL (ref 4.0–10.5)
nRBC: 0 % (ref 0.0–0.2)

## 2023-02-23 LAB — BASIC METABOLIC PANEL
Anion gap: 17 — ABNORMAL HIGH (ref 5–15)
Anion gap: 16 — ABNORMAL HIGH (ref 5–15)
BUN: 85 mg/dL — ABNORMAL HIGH (ref 8–23)
BUN: 80 mg/dL — ABNORMAL HIGH (ref 8–23)
CO2: 42 mmol/L — ABNORMAL HIGH (ref 22–32)
CO2: 42 mmol/L — ABNORMAL HIGH (ref 22–32)
Calcium: 9.5 mg/dL (ref 8.9–10.3)
Calcium: 9.5 mg/dL (ref 8.9–10.3)
Chloride: 83 mmol/L — ABNORMAL LOW (ref 98–111)
Chloride: 81 mmol/L — ABNORMAL LOW (ref 98–111)
Creatinine, Ser: 2.84 mg/dL — ABNORMAL HIGH (ref 0.61–1.24)
Creatinine, Ser: 2.87 mg/dL — ABNORMAL HIGH (ref 0.61–1.24)
GFR, Estimated: 22 mL/min — ABNORMAL LOW (ref >60)
GFR, Estimated: 22 mL/min — ABNORMAL LOW (ref >60)
Glucose, Bld: 133 mg/dL — ABNORMAL HIGH (ref 70–99)
Glucose, Bld: 165 mg/dL — ABNORMAL HIGH (ref 70–99)
Potassium: 2.9 mmol/L — ABNORMAL LOW (ref 3.5–5.1)
Potassium: 3 mmol/L — ABNORMAL LOW (ref 3.5–5.1)
Sodium: 142 mmol/L (ref 135–145)
Sodium: 139 mmol/L (ref 135–145)

## 2023-02-23 LAB — MAGNESIUM: Magnesium: 2.7 mg/dL — ABNORMAL HIGH (ref 1.7–2.4)

## 2023-02-23 MED ORDER — FESOTERODINE FUMARATE ER 4 MG PO TB24
4.0000 mg | ORAL_TABLET | Freq: Every day | ORAL | Status: DC
  Administered 2023-02-23 – 2023-02-27 (×5): 4 mg via ORAL
  Filled 2023-02-23 (×5): qty 1

## 2023-02-23 MED ORDER — POTASSIUM CHLORIDE CRYS ER 20 MEQ PO TBCR
40.0000 meq | EXTENDED_RELEASE_TABLET | ORAL | Status: AC
  Administered 2023-02-23 (×2): 40 meq via ORAL
  Filled 2023-02-23: qty 2

## 2023-02-23 MED ORDER — OXYCODONE HCL 5 MG PO TABS: 10.0000 mg | ORAL_TABLET | Freq: Once | ORAL | Status: DC

## 2023-02-23 MED ORDER — FENTANYL CITRATE PF 50 MCG/ML IJ SOSY
25.0000 ug | PREFILLED_SYRINGE | Freq: Once | INTRAMUSCULAR | Status: AC
  Administered 2023-02-23: 25 ug via INTRAVENOUS
  Filled 2023-02-23: qty 1

## 2023-02-23 MED ORDER — POTASSIUM CHLORIDE CRYS ER 20 MEQ PO TBCR
40.0000 meq | EXTENDED_RELEASE_TABLET | ORAL | Status: AC
  Administered 2023-02-23 (×2): 40 meq via ORAL
  Filled 2023-02-23 (×2): qty 2

## 2023-02-23 MED ORDER — MAGNESIUM SULFATE 2 GM/50ML IV SOLN
2.0000 g | Freq: Once | INTRAVENOUS | Status: AC
  Administered 2023-02-23: 2 g via INTRAVENOUS
  Filled 2023-02-23: qty 50

## 2023-02-23 MED ORDER — ACETAZOLAMIDE SODIUM 500 MG IJ SOLR
250.0000 mg | Freq: Once | INTRAMUSCULAR | Status: AC
Start: 2023-02-23 — End: 2023-02-23
  Administered 2023-02-23: 250 mg via INTRAVENOUS
  Filled 2023-02-23: qty 250

## 2023-02-23 NOTE — Progress Notes (Signed)
PROGRESS NOTE    Tony Williams  WUJ:[id number] DOB: [birth year] DOA: 02/14/2023 PCP: Erasmo Downer, MD   Assessment & Plan:   Principal Problem:   Acute on chronic diastolic CHF (congestive heart failure) (HCC) Active Problems:   Acute on chronic respiratory failure with hypoxia (HCC)   CAD (coronary artery disease)   Myocardial injury   Chronic obstructive pulmonary disease (HCC)   Blurry vision, bilateral   Hypertension associated with diabetes (HCC)   Hyperlipidemia associated with type 2 diabetes mellitus (HCC)   Type II diabetes mellitus with renal manifestations (HCC)   Stage 3b chronic kidney disease (CKD) (HCC)   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 Likely secondary to acute on chronic CHF exacerbation.    Acute on chronic diastolic CHF: echo on 10/29/2021 showed EF 50-55% with grade I diastolic dysfunction.  3+ leg edema, 15 pounds weight gain, elevated BNP 627, clinically consistent with CHF exacerbation. Holding diuretic. Diamox x 1 given persistent alkalosis as per cardio. No GDMT w/ AKI on CKD as per cardio    Contraction alkalosis: diamox x 1 again as per cardio    Hx  of CAD: continue on aspirin, statin. Troponin elevation secondary to demand ischemia. No candidate for cardiac cath as per cardio  Trop elevation 2/2 demand ischemia   Urinary retention: pt pulled out foley cath on 12/10 and now w/ hematuria and clots. Foley placed again 02/23/23. Urology messaged again 02/23/23   Bacteruria: no other signs/symptoms of UTI. No need to treat for UTI    Hospital delirium: continue on seroquel    COPD: w/o exacerbation. Continue on  bronchodilators    Blurry vision, bilateral: etiology unclear. It has been going on for more than 3 weeks intermittently.  No unilateral weakness in extremities.  No facial droop or slurred speech.  I offered to do MRI, but the patient has claustrophobia, need open MRI.  Patient states that he wants to see eye doctor first before doing MRI. CT head neg.    HTN: continue on imdur. Holding coreg, amlodipine, hydralazine   HLD: continue on statin    DM2: HbA1c 6.3, well controlled.   AKI on CKDIIIb: Cr is labile. Avoid nephrotoxic meds   Chronic gout: continue on allopurinol. Holding colchicine    Depression: severity unknown. Continue on home dose of lexapro   OSA: CPAP at bedtime    Morbid obesity: BMI 40.4. Complicates overall care & prognosis    Somnolence: continue w/ CPAP at bedtime & naps. Holding home dose of lyrica, gabapentin    Left knee osteoarthritis: continue w/ lidocaine patch. Oxy prn         DVT prophylaxis: heparin  Code Status: full  Family Communication: discussed pt's care w/ pt's  wife at bedside and answered her questions  Disposition Plan: likely d/c to SNF   Level of care: Telemetry Cardiac Status is: Inpatient Remains inpatient appropriate because: severity of illness    Consultants:  Urology   Procedures:   Antimicrobials:   Subjective: Pt c/o bladder pain   Objective: Vitals:   02/22/23 2000 02/22/23 2340 02/23/23 0359 02/23/23 0424  BP: (!) 154/68 (!) 111/50 (!) 140/58   Pulse: 73 65  71   Resp: 12 15 15    Temp: 98 F (36.7 C) 98.4 F (36.9 C) 98.5 F (36.9 C)   TempSrc: Oral Oral Oral   SpO2: 96% 92% 94%   Weight:    128 kg  Height:        Intake/Output Summary (Last 24 hours) at 02/23/2023 0811 Last data filed at 02/23/2023 0045 Gross per 24 hour  Intake 360 ml  Output 525 ml  Net -165 ml   Filed Weights   02/21/23 0500 02/22/23 0500 02/23/23 0424  Weight: 131.2 kg 124.4 kg 128 kg    Examination:  General exam:  Appears calm and comfortable  Respiratory system: Clear to auscultation. Respiratory effort normal. Cardiovascular system: S1 & S2 +. No  rubs, gallops or clicks.  Gastrointestinal system: Abdomen is nondistended, soft and nontender. Normal bowel sounds heard. Central nervous system: lethargic. Moves all extremities  Psychiatry: Judgement and insight appears not at baseline. Flat mood and affect.     Data Reviewed: I have personally reviewed following labs and imaging studies  CBC: Recent Labs  Lab 02/17/23 0439 02/20/23 0429 02/23/23 0329  WBC 8.6 7.3 9.3  HGB 12.0* 12.2* 12.9*  HCT 39.0 39.7 41.4  MCV 92.0 91.7 90.8  PLT 206 198 199   Basic Metabolic Panel: Recent Labs  Lab 02/17/23 0439 02/17/23 1604 02/20/23 0429 02/21/23 0422 02/22/23 0330 02/22/23 1702 02/23/23 0329  NA 138   < > 139 142 137 139 142  K 5.4*   < > 3.7 3.5 4.0 3.1* 2.9*  CL 100   < > 87* 85* 79* 79* 83*  CO2 29   < > 39* 41* 38* 41* 42*  GLUCOSE 149*   < > 111* 95 204* 164* 133*  BUN 70*   < > 71* 76* 77* 89* 85*  CREATININE 3.19*   < > 2.38* 2.29* 2.82* 2.81* 2.84*  CALCIUM 8.9   < > 9.3 9.1 9.8 9.3 9.5  MG 2.3  --   --  2.4 2.5*  --  2.7*   < > = values in this interval not displayed.   GFR: Estimated Creatinine Clearance: 29.7 mL/min (A) (by C-G formula based on SCr of 2.84 mg/dL (H)). Liver Function Tests: No results for input(s): "AST", "ALT", "ALKPHOS", "BILITOT", "PROT", "ALBUMIN" in the last 168 hours. No results for input(s): "LIPASE", "AMYLASE" in the last 168 hours. Recent Labs  Lab 02/18/23 0946  AMMONIA 33   Coagulation Profile: No results for input(s): "INR", "PROTIME" in the last 168 hours. Cardiac Enzymes: No results for input(s): "CKTOTAL", "CKMB", "CKMBINDEX", "TROPONINI" in the last 168 hours. BNP (last 3 results) No results for input(s): "PROBNP" in the last 8760 hours. HbA1C: No results for input(s): "HGBA1C" in the last 72 hours. CBG: Recent Labs  Lab  02/18/23 2058 02/19/23 0757 02/19/23 1134 02/19/23 1552 02/20/23 0021  GLUCAP 143* 100* 135* 153* 117*   Lipid Profile: No results for input(s): "CHOL", "HDL", "LDLCALC", "TRIG", "CHOLHDL", "LDLDIRECT" in the last 72 hours. Thyroid Function Tests: No results for input(s): "TSH", "T4TOTAL", "FREET4", "T3FREE", "THYROIDAB" in the last 72 hours. Anemia Panel: No results for input(s): "VITAMINB12", "FOLATE", "FERRITIN", "TIBC", "IRON", "RETICCTPCT" in the last 72 hours. Sepsis Labs: Recent Labs  Lab 02/17/23 1255 02/17/23 1604  LATICACIDVEN 0.7 0.6    Recent Results (from the past 240 hour(s))  SARS Coronavirus 2 by RT PCR (hospital order, performed in [facility] hospital lab) *cepheid single result test* Anterior Nasal Swab  Status: None   Collection Time: 02/14/23  7:01 PM   Specimen: Anterior Nasal Swab  Result Value Ref Range Status   SARS Coronavirus 2 by RT PCR NEGATIVE NEGATIVE Final    Comment: (NOTE) SARS-CoV-2 target nucleic acids are NOT DETECTED.  The SARS-CoV-2 RNA is generally detectable in upper and lower respiratory specimens during the acute phase of infection. The lowest concentration of SARS-CoV-2 viral copies this assay can detect is 250 copies / mL. A negative result does not preclude SARS-CoV-2 infection and should not be used as the sole basis for treatment or other patient management decisions.  A negative result may occur with improper specimen collection / handling, submission of specimen other than nasopharyngeal swab, presence of viral mutation(s) within the areas targeted by this assay, and inadequate number of viral copies (<250 copies / mL). A negative result must be combined with clinical observations, patient history, and epidemiological information.  Fact Sheet for Patients:   RoadLapTop.co.za  Fact Sheet for Healthcare Providers: http://kim-miller.com/  This test is not yet approved or   cleared by the [place] FDA and has been authorized for detection and/or diagnosis of SARS-CoV-2 by FDA under an Emergency Use Authorization (EUA).  This EUA will remain in effect (meaning this test can be used) for the duration of the COVID-19 declaration under Section 564(b)(1) of the Act, 21 U.S.C. section 360bbb-3(b)(1), unless the authorization is terminated or revoked sooner.  Performed at [facility], [address] [place], [place] [address]          Radiology Studies: No results found.      Scheduled Meds:  allopurinol  300 mg Oral Daily   aspirin EC  81 mg Oral Daily   Chlorhexidine Gluconate Cloth  6 each Topical Daily   cholecalciferol  4,000 Units Oral Daily   escitalopram  20 mg Oral Daily   ezetimibe  10 mg Oral Daily   heparin injection (subcutaneous)  5,000 Units Subcutaneous Q8H   lidocaine  2 patch Transdermal Q24H   loratadine  10 mg Oral Daily   potassium chloride  40 mEq Oral Q3H   pravastatin  40 mg Oral q1800   QUEtiapine  25 mg Oral QHS   Continuous Infusions:   LOS: 9 days      Charise Killian, MD [facility] Pager [phone]  If 7PM-7AM, please contact night-coverage [email or web address] 02/23/2023, 8:11 AM

## 2023-02-23 NOTE — TOC Progression Note (Signed)
Transition of Care [facility]) - Progression Note    Patient Details  Name: Tony Williams MRN: [id number] Date of Birth: 21-Jul-1945  Transition of Care [facility]) CM/SW Contact  Margarito Liner, LCSW Phone Number: 02/23/2023, 1:47 PM  Clinical Narrative:   CSW continues to follow for SNF placement. Per urology, patient may need suprapubic catheter placement. Patient has also been using bipap. Will update referral and send back out. [facility] is considering referral and stated he has to be ambulatory.  Expected Discharge Plan and Services                                               Social Determinants of Health (SDOH) Interventions SDOH Screenings   Food Insecurity: No Food Insecurity (02/15/2023)  Housing: Low Risk  (02/18/2023)  Transportation Needs: No Transportation Needs (02/18/2023)  Utilities: Not At Risk (02/15/2023)  Alcohol Screen: Low Risk  (08/10/2022)  Depression (PHQ2-9): Low Risk  (08/10/2022)  Financial Resource Strain: Low Risk  (02/18/2023)  Physical Activity: Inactive (05/19/2021)  Social Connections: Unknown (05/19/2021)  Stress: No Stress Concern Present (05/19/2021)  Recent Concern: Stress - Stress Concern Present (02/26/2021)  Tobacco Use: Medium Risk (02/18/2023)    Readmission Risk Interventions    02/15/2023    9:37 AM 12/11/2020   11:06 AM  Readmission Risk Prevention Plan  Transportation Screening Complete Complete  PCP or Specialist Appt within 5-7 Days  Complete  PCP or Specialist Appt within 3-5 Days Complete   Medication Review (RN CM)  Complete  HRI or Home Care Consult Complete   Social Work Consult for Recovery Care Planning/Counseling Complete   Palliative Care Screening Not Applicable   Medication Review Oceanographer) Not Complete   Med Review Comments patient will discuss medication upon discharge

## 2023-02-23 NOTE — Progress Notes (Signed)
Advanced Heart Failure Rounding Note  PCP-Cardiologist: Debbe Odea, MD   Subjective:   Admitted with acute systolic heart failure.   Held diuresis yesterday with profound chloride depletion alkalosis just diamox x1. Will replete Kcl and repeat 250mg  x1. Mental status continues to be poor, in pain from I/O cath this morning. Exam improved from a HF standpoint.   Objective:   Weight Range: 128 kg Body mass index is 40.49 kg/m.   Vital Signs:   Temp:  [98 F (36.7 C)-98.5 F (36.9 C)] 98 F (36.7 C) (12/11 0814) Pulse Rate:  [65-73] 70 (12/11 0814) Resp:  [12-16] 16 (12/11 0814) BP: (111-158)/(50-68) 158/63 (12/11 0814) SpO2:  [92 %-96 %] 95 % (12/11 0814) Weight:  [914 kg] 128 kg (12/11 0424) Last BM Date : (S) 02/22/23  Weight change: Filed Weights   02/21/23 0500 02/22/23 0500 02/23/23 0424  Weight: 131.2 kg 124.4 kg 128 kg   Intake/Output:   Intake/Output Summary (Last 24 hours) at 02/23/2023 0947 Last data filed at 02/23/2023 0855 Gross per 24 hour  Intake 360 ml  Output 725 ml  Net -365 ml    Physical Exam  General:  elderly appearing.  No respiratory difficulty HEENT: Neck: supple. JVD ~5 cm. Carotids 2+ bilat; no bruits. No lymphadenopathy or thyromegaly appreciated. Cor: PMI nondisplaced. Regular rate & rhythm. No rubs, gallops or murmurs. Lungs: diminished bases Abdomen: soft, nontender, nondistended. No hepatosplenomegaly. No bruits or masses. Extremities: no cyanosis, clubbing, rash, trace LE edema Neuro: alert & oriented to person and place, moaning.   Patient Profile   Tony Williams is a 77 y.o. male with a history of CAD status post four-vessel bypass in 2007 with subsequent interventions, chronic HFpEF, hypertension, hyperlipidemia, statin intolerance, obesity, diabetes, remote tobacco abuse, COPD, sleep apnea (not using CPAP), and stage IIIB chronic kidney disease who presented with acute on chronic systolic heart  failure.  Assessment/Plan  1. Acute systolic HF - Echo this admit. Difficult images EF ~35% (not well seen) RV not seen well but does not appear to be significantly dilated - NYHA IV (multifactorial). Volume overloaded - Unclear if volume overloaded due primarily to HF or worsening renal function or both - Repeat diamox 250mg  x1 today given persistent chloride deplete alkalosis - Not a candidate for coronary angiography. RHC unlikely to change current clinical trajectory as he appears better compensated from a cardiac standpoint - No GDMT with AKI on CKD   2. CAD  - Last cath 8/22 with new occlusion of SVG-OM and high-grade stenosis of SVG -> LAD -> D2 requiring high-risk PCI and IABP support  - Continue ASA/statin   3. Acute on chronic kidney injury CKD 3b at baseline - Baseline SCr ~2.5 - Stable from yesterday but still above baseline - Holding diuretics as above - Followed by Dr. Thedore Mins CKD   4. Acute on chronic hypoxic/hypercarbic respiratory failure - suspect primarily OSA/OHS but also HF - continue CPAP. Diurese - Needs weight loss   5. Metabolic encephalopathy - suspect combination of CO2 retention and possible early uremia - continue Bipap - watch renal function   6. Morbid obesity -Body mass index is 40.49 kg/m.  - consider GLP1RA as outpatient   6. Severe debility - needs PT/OT - likely needs short-term rehab/SNF at d/c - Poor long term prognosis, patient may benefit from palliative care consultation   7. DM2 - Per primary   Length of Stay: 9  Romie Minus, MD  02/23/2023, 9:47 AM  Advanced Heart Failure Team Pager [phone] (M-F; 7a - 5p)  Please contact [facility] for night-coverage after hours (5p -7a ) and weekends on [email or web address]

## 2023-02-23 NOTE — [place] FL2 (Signed)
[place] MEDICAID FL2 LEVEL OF CARE FORM     IDENTIFICATION  Patient Name: Tony Williams Birthdate: [birth year] Sex: male Admission Date (Current Location): 02/14/2023  [facility] and [place] Number:  Producer, television/film/video and Address:  [facility], [address], [place], [place] [address]      Provider Number: [id number]  Attending Physician Name and Address:  Charise Killian, MD  Relative Name and Phone Number:  Edrian, Lestage (Daughter)  [phone] (Mobile)    Current Level of Care: Hospital Recommended Level of Care: Skilled Nursing Facility Prior Approval Number:    Date Approved/Denied:   PASRR Number: [id number]  Discharge Plan: SNF    Current Diagnoses: Patient Active Problem List   Diagnosis Date Noted   Dilated cardiomyopathy (HCC) 02/16/2023   Acute on chronic congestive heart failure (HCC) 02/15/2023   Type II diabetes mellitus with renal manifestations (HCC) 02/14/2023   CAD (coronary artery disease) 02/14/2023   Myocardial injury 02/14/2023   Depression with anxiety 02/14/2023   Acute on chronic respiratory failure with hypoxia (HCC) 02/14/2023   Blurry vision, bilateral 02/14/2023   Stage 3b chronic kidney disease (CKD) (HCC) 08/10/2022   Acute gout due to renal impairment involving toe of left foot 05/31/2022   Hx of CABG 10/29/2021   Chronic respiratory failure with hypoxia (HCC) 10/28/2021   Unstable angina (HCC) 10/28/2021   Senile purpura (HCC) 08/06/2021   OSA (obstructive sleep apnea) 05/29/2021   Daytime sleepiness 02/12/2021   Idiopathic hypotension 12/16/2020   Hx of myocardial infarction 12/16/2020   Bradycardia 12/16/2020   Dizziness 12/16/2020   Symptomatic sinus bradycardia 12/08/2020   Dyspnea 11/15/2020   Acute on chronic diastolic CHF (congestive heart failure) (HCC) 11/10/2020   NSTEMI (non-ST elevated myocardial infarction) (HCC) 11/08/2020   Polyp of transverse colon    Morbid obesity (HCC)  07/21/2020   Chronic pain of both knees 07/21/2020   Diabetic polyneuropathy associated with type 2 diabetes mellitus (HCC) 07/21/2020   Chronic gout due to renal impairment without tophus 07/21/2020   Hypertension associated with diabetes (HCC) 07/21/2020   Hyperlipidemia associated with type 2 diabetes mellitus (HCC) 07/21/2020   GAD (generalized anxiety disorder) 07/21/2020   Psychophysiological insomnia 07/21/2020   Vitamin D insufficiency 04/18/2020   Chronic obstructive pulmonary disease (HCC) 04/18/2020   T2DM (type 2 diabetes mellitus) (HCC) 04/18/2020   Coronary artery disease involving coronary bypass graft of native heart without angina pectoris 04/18/2020   S/P primary angioplasty with coronary stent 04/18/2020   History of smoking at least 1 pack per day for at least 30 years 04/18/2020    Orientation RESPIRATION BLADDER Height & Weight     Self, Time, Situation, Place  O2, Other (Comment) (Nasal Cannula 3 L. Bipap QHS 15/10 at 40%.) Incontinent, Indwelling catheter (Might need suprapubic catheter depending on how he progresses) Weight: 282 lb 3 oz (128 kg) Height:  5\' 10"  (177.8 cm)  BEHAVIORAL SYMPTOMS/MOOD NEUROLOGICAL BOWEL NUTRITION STATUS   (None)  (None) Incontinent Diet (2 gram sodium)  AMBULATORY STATUS COMMUNICATION OF NEEDS Skin   Extensive Assist Verbally Bruising, Other (Comment) (Erythema/redness.)                       Personal Care Assistance Level of Assistance  Bathing, Feeding, Dressing Bathing Assistance: Maximum assistance Feeding assistance: Limited assistance Dressing Assistance: Maximum assistance     Functional Limitations Info  Sight, Hearing, Speech Sight Info: Adequate Hearing Info: Adequate Speech Info: Adequate  SPECIAL CARE FACTORS FREQUENCY  PT (By licensed PT), OT (By licensed OT)     PT Frequency: 5 x week OT Frequency: 5 x week            Contractures Contractures Info: Not present    Additional Factors Info   Code Status, Allergies Code Status Info: Full code Allergies Info: Dilaudid (Hydromorphone), Butorphanol, Morphine, Pedi-pre Tape Spray (Wound Dressing Adhesive), Statins, Zocor (Simvastatin)           Current Medications (02/23/2023):  This is the current hospital active medication list Current Facility-Administered Medications  Medication Dose Route Frequency Provider Last Rate Last Admin   acetaminophen (TYLENOL) tablet 650 mg  650 mg Oral Q6H PRN Lorretta Harp, MD   650 mg at 02/21/23 1711   albuterol (PROVENTIL) (2.5 MG/3ML) 0.083% nebulizer solution 2.5 mg  2.5 mg Nebulization Q4H PRN Lorretta Harp, MD       allopurinol (ZYLOPRIM) tablet 300 mg  300 mg Oral Daily Lorretta Harp, MD   300 mg at 02/23/23 1104   aspirin EC tablet 81 mg  81 mg Oral Daily Lorretta Harp, MD   81 mg at 02/23/23 1105   Chlorhexidine Gluconate Cloth 2 % PADS 6 each  6 each Topical Daily Darlin Priestly, MD   6 each at 02/23/23 1108   cholecalciferol (VITAMIN D3) 25 MCG (1000 UNIT) tablet 4,000 Units  4,000 Units Oral Daily Lorretta Harp, MD   4,000 Units at 02/23/23 1104   dextromethorphan-guaiFENesin (MUCINEX DM) 30-600 MG per 12 hr tablet 1 tablet  1 tablet Oral BID PRN Lorretta Harp, MD       escitalopram (LEXAPRO) tablet 20 mg  20 mg Oral Daily Lorretta Harp, MD   20 mg at 02/23/23 1104   ezetimibe (ZETIA) tablet 10 mg  10 mg Oral Daily Creig Hines, NP   10 mg at 02/23/23 1104   heparin injection 5,000 Units  5,000 Units Subcutaneous Q8H Darlin Priestly, MD   5,000 Units at 02/23/23 0554   lidocaine (LIDODERM) 5 % 2 patch  2 patch Transdermal Q24H Darlin Priestly, MD   2 patch at 02/22/23 1807   loratadine (CLARITIN) tablet 10 mg  10 mg Oral Daily Lorretta Harp, MD   10 mg at 02/21/23 1610   magnesium sulfate IVPB 2 g 50 mL  2 g Intravenous Once Romie Minus, MD       nitroGLYCERIN (NITROSTAT) SL tablet 0.4 mg  0.4 mg Sublingual Q5 min PRN Lorretta Harp, MD       ondansetron [facility]) injection 4 mg  4 mg Intravenous Q8H PRN Lorretta Harp, MD   4 mg at 02/22/23 1358   Oral care mouth rinse  15 mL Mouth Rinse PRN Manuela Schwartz, NP       oxyCODONE (Oxy IR/ROXICODONE) immediate release tablet 5 mg  5 mg Oral Q6H PRN Darlin Priestly, MD   5 mg at 02/23/23 0405   polyvinyl alcohol (LIQUIFILM TEARS) 1.4 % ophthalmic solution 1 drop  1 drop Both Eyes PRN Darlin Priestly, MD       potassium chloride SA (KLOR-CON M) CR tablet 40 mEq  40 mEq Oral Q3H Romie Minus, MD       pravastatin (PRAVACHOL) tablet 40 mg  40 mg Oral q1800 Darlin Priestly, MD   40 mg at 02/22/23 1807   QUEtiapine (SEROQUEL) tablet 25 mg  25 mg Oral Shellia Cleverly, MD   25 mg at 02/22/23 2059     Discharge  Medications: Please see discharge summary for a list of discharge medications.  Relevant Imaging Results:  Relevant Lab Results:   Additional Information SS#: [id number]  Margarito Liner, LCSW

## 2023-02-24 ENCOUNTER — Other Ambulatory Visit: Payer: Self-pay | Admitting: Physician Assistant

## 2023-02-24 DIAGNOSIS — I5033 Acute on chronic diastolic (congestive) heart failure: Secondary | ICD-10-CM | POA: Diagnosis not present

## 2023-02-24 DIAGNOSIS — R310 Gross hematuria: Secondary | ICD-10-CM | POA: Diagnosis not present

## 2023-02-24 DIAGNOSIS — R338 Other retention of urine: Secondary | ICD-10-CM

## 2023-02-24 DIAGNOSIS — Z960 Presence of urogenital implants: Secondary | ICD-10-CM

## 2023-02-24 DIAGNOSIS — I5021 Acute systolic (congestive) heart failure: Secondary | ICD-10-CM | POA: Diagnosis not present

## 2023-02-24 LAB — CBC
HCT: 41.5 % (ref 39.0–52.0)
Hemoglobin: 12.8 g/dL — ABNORMAL LOW (ref 13.0–17.0)
MCH: 28.1 pg (ref 26.0–34.0)
MCHC: 30.8 g/dL (ref 30.0–36.0)
MCV: 91 fL (ref 80.0–100.0)
Platelets: 233 10*3/uL (ref 150–400)
RBC: 4.56 MIL/uL (ref 4.22–5.81)
RDW: 15.9 % — ABNORMAL HIGH (ref 11.5–15.5)
WBC: 8.6 10*3/uL (ref 4.0–10.5)
nRBC: 0 % (ref 0.0–0.2)

## 2023-02-24 LAB — BASIC METABOLIC PANEL
Anion gap: 15 (ref 5–15)
BUN: 75 mg/dL — ABNORMAL HIGH (ref 8–23)
CO2: 38 mmol/L — ABNORMAL HIGH (ref 22–32)
Calcium: 9.5 mg/dL (ref 8.9–10.3)
Chloride: 88 mmol/L — ABNORMAL LOW (ref 98–111)
Creatinine, Ser: 2.35 mg/dL — ABNORMAL HIGH (ref 0.61–1.24)
GFR, Estimated: 28 mL/min — ABNORMAL LOW (ref >60)
Glucose, Bld: 143 mg/dL — ABNORMAL HIGH (ref 70–99)
Potassium: 3.2 mmol/L — ABNORMAL LOW (ref 3.5–5.1)
Sodium: 141 mmol/L (ref 135–145)

## 2023-02-24 LAB — BLOOD GAS, VENOUS
Acid-Base Excess: 26.7 mmol/L — ABNORMAL HIGH (ref 0.0–2.0)
Bicarbonate: 56.2 mmol/L — ABNORMAL HIGH (ref 20.0–28.0)
O2 Saturation: 87.4 %
Patient temperature: 37
pCO2, Ven: 79 mm[Hg] (ref 44–60)
pH, Ven: 7.46 — ABNORMAL HIGH (ref 7.25–7.43)
pO2, Ven: 54 mm[Hg] — ABNORMAL HIGH (ref 32–45)

## 2023-02-24 LAB — GLUCOSE, CAPILLARY: Glucose-Capillary: 145 mg/dL — ABNORMAL HIGH (ref 70–99)

## 2023-02-24 LAB — MAGNESIUM: Magnesium: 3 mg/dL — ABNORMAL HIGH (ref 1.7–2.4)

## 2023-02-24 MED ORDER — ISOSORBIDE MONONITRATE ER 30 MG PO TB24
30.0000 mg | ORAL_TABLET | Freq: Every day | ORAL | Status: DC
  Administered 2023-02-24 – 2023-03-07 (×11): 30 mg via ORAL
  Filled 2023-02-24 (×11): qty 1

## 2023-02-24 MED ORDER — POTASSIUM CHLORIDE CRYS ER 20 MEQ PO TBCR: 20.0000 meq | EXTENDED_RELEASE_TABLET | Freq: Once | ORAL | Status: DC

## 2023-02-24 MED ORDER — HYDRALAZINE HCL 10 MG PO TABS
10.0000 mg | ORAL_TABLET | Freq: Three times a day (TID) | ORAL | Status: DC
  Administered 2023-02-24 – 2023-03-15 (×49): 10 mg via ORAL
  Filled 2023-02-24 (×52): qty 1

## 2023-02-24 MED ORDER — POTASSIUM CHLORIDE CRYS ER 20 MEQ PO TBCR
40.0000 meq | EXTENDED_RELEASE_TABLET | ORAL | Status: AC
  Administered 2023-02-24 (×2): 40 meq via ORAL
  Filled 2023-02-24 (×2): qty 2

## 2023-02-24 MED ORDER — SENNOSIDES-DOCUSATE SODIUM 8.6-50 MG PO TABS
2.0000 | ORAL_TABLET | Freq: Once | ORAL | Status: AC
  Administered 2023-02-24: 2 via ORAL
  Filled 2023-02-24: qty 2

## 2023-02-24 NOTE — Care Management Important Message (Signed)
Important Message  Patient Details  Name: Tony Williams MRN: [id number] Date of Birth: 07-Mar-1946   Important Message Given:  Yes - Medicare IM     Verita Schneiders [name] 02/24/2023, 11:57 AM

## 2023-02-24 NOTE — Progress Notes (Signed)
Advanced Heart Failure Rounding Note  PCP-Cardiologist: Debbe Odea, MD   Subjective:   Admitted with acute systolic heart failure.   Improved this AM per wife at bedside, he is resting comfortably. Appears near euvolemic from a HF standpoint.  Objective:   Weight Range: 125.2 kg Body mass index is 39.6 kg/m.   Vital Signs:   Temp:  [98.1 F (36.7 C)-98.9 F (37.2 C)] 98.9 F (37.2 C) (12/12 0349) Pulse Rate:  [53-73] 65 (12/12 1159) Resp:  [16-18] 18 (12/12 0349) BP: (126-176)/(59-70) 176/68 (12/12 1159) SpO2:  [96 %-99 %] 97 % (12/12 1159) FiO2 (%):  [3 %-40 %] 40 % (12/12 0745) Weight:  [125.2 kg] 125.2 kg (12/12 0400) Last BM Date : 02/23/23  Weight change: Filed Weights   02/22/23 0500 02/23/23 0424 02/24/23 0400  Weight: 124.4 kg 128 kg 125.2 kg   Intake/Output:   Intake/Output Summary (Last 24 hours) at 02/24/2023 1250 Last data filed at 02/24/2023 0545 Gross per 24 hour  Intake 560 ml  Output 2800 ml  Net -2240 ml    Physical Exam  General:  elderly appearing.  No respiratory difficulty HEENT: NCAT Neck: supple. JVD ~5 cm. Carotids 2+ bilat; no bruits. No lymphadenopathy or thyromegaly appreciated. Cor: PMI nondisplaced. Regular rate & rhythm. No rubs, gallops or murmurs. Lungs: diminished bases Abdomen: soft, nontender, nondistended. No hepatosplenomegaly. No bruits or masses. Extremities: no cyanosis, clubbing, rash, trace LE edema Neuro: alert & oriented to person and place, moaning.   Patient Profile   Tony Williams is a 77 y.o. male with a history of CAD status post four-vessel bypass in 2007 with subsequent interventions, chronic HFpEF, hypertension, hyperlipidemia, statin intolerance, obesity, diabetes, remote tobacco abuse, COPD, sleep apnea (not using CPAP), and stage IIIB chronic kidney disease who presented with acute on chronic systolic heart failure.  Assessment/Plan  1. Acute systolic HF - Echo this admit. Difficult images  EF ~35% (not well seen) RV not seen well but does not appear to be significantly dilated - Hold diuresis an additional day today, can likely resume oral loop diuretic tomorrow - Not a candidate for coronary angiography. RHC unlikely to change current clinical trajectory as he appears better compensated from a cardiac standpoint - No GDMT with AKI on CKD, SGLT-2 with potential long term foley need, BB with bradycardia - Can start hydralazine/isordil for persistently elevated BP   2. CAD  - Last cath 8/22 with new occlusion of SVG-OM and high-grade stenosis of SVG -> LAD -> D2 requiring high-risk PCI and IABP support  - Continue ASA/statin   3. Acute on chronic kidney injury CKD 3b at baseline - Baseline SCr ~2.5 -Improving - Holding diuretics as above - Followed by Dr. Thedore Mins CKD   4. Acute on chronic hypoxic/hypercarbic respiratory failure - suspect primarily OSA/OHS but also HF - continue CPAP. Diurese - Needs weight loss   5. Metabolic encephalopathy - suspect combination of CO2 retention and possible early uremia - continue Bipap - watch renal function   6. Morbid obesity -Body mass index is 39.6 kg/m.  - consider GLP1RA as outpatient   6. Severe debility - needs PT/OT - likely needs short-term rehab/SNF at d/c - Poor long term prognosis, patient may benefit from palliative care consultation   7. DM2 - Per primary   Length of Stay: 10  Romie Minus, MD  02/24/2023, 12:50 PM  Advanced Heart Failure Team Pager [phone] (M-F; 7a - 5p)  Please contact [facility] for night-coverage  after hours (5p -7a ) and weekends on [email or web address]

## 2023-02-24 NOTE — Progress Notes (Signed)
Urology Inpatient Progress Note  Subjective: He traumatically self removed his Foley catheter 2 days ago.  Bladder scans were initially minimal, however he developed bladder pain yesterday requiring Foley catheter replacement.  There was immediate passage of gross hematuria with clots per primary team. Foley catheter in place draining clear, yellow urine with some nonocclusive blood product sediment in the tubing today.  Hemoglobin stable, 12.8. He is accompanied today by his wife at the bedside.  They are curious about outpatient follow-up with the urologist who manages artificial urinary sphincters.  Anti-infectives: Anti-infectives (From admission, onward)    None       Current Facility-Administered Medications  Medication Dose Route Frequency Provider Last Rate Last Admin   acetaminophen (TYLENOL) tablet 650 mg  650 mg Oral Q6H PRN Lorretta Harp, MD   650 mg at 02/21/23 1711   albuterol (PROVENTIL) (2.5 MG/3ML) 0.083% nebulizer solution 2.5 mg  2.5 mg Nebulization Q4H PRN Lorretta Harp, MD       allopurinol (ZYLOPRIM) tablet 300 mg  300 mg Oral Daily Lorretta Harp, MD   300 mg at 02/24/23 0945   aspirin EC tablet 81 mg  81 mg Oral Daily Lorretta Harp, MD   81 mg at 02/24/23 0945   Chlorhexidine Gluconate Cloth 2 % PADS 6 each  6 each Topical Daily Darlin Priestly, MD   6 each at 02/24/23 0946   cholecalciferol (VITAMIN D3) 25 MCG (1000 UNIT) tablet 4,000 Units  4,000 Units Oral Daily Lorretta Harp, MD   4,000 Units at 02/24/23 0945   dextromethorphan-guaiFENesin (MUCINEX DM) 30-600 MG per 12 hr tablet 1 tablet  1 tablet Oral BID PRN Lorretta Harp, MD       escitalopram (LEXAPRO) tablet 20 mg  20 mg Oral Daily Lorretta Harp, MD   20 mg at 02/24/23 0945   ezetimibe (ZETIA) tablet 10 mg  10 mg Oral Daily Creig Hines, NP   10 mg at 02/24/23 0945   fesoterodine (TOVIAZ) tablet 4 mg  4 mg Oral Daily Charise Killian, MD   4 mg at 02/24/23 0945   heparin injection 5,000 Units  5,000 Units Subcutaneous  Pearletha Alfred, MD   5,000 Units at 02/24/23 0413   lidocaine (LIDODERM) 5 % 2 patch  2 patch Transdermal Q24H Darlin Priestly, MD   2 patch at 02/23/23 1817   loratadine (CLARITIN) tablet 10 mg  10 mg Oral Daily Lorretta Harp, MD   10 mg at 02/24/23 0945   nitroGLYCERIN (NITROSTAT) SL tablet 0.4 mg  0.4 mg Sublingual Q5 min PRN Lorretta Harp, MD       ondansetron [facility]) injection 4 mg  4 mg Intravenous Q8H PRN Lorretta Harp, MD   4 mg at 02/24/23 0414   Oral care mouth rinse  15 mL Mouth Rinse PRN Manuela Schwartz, NP       oxyCODONE (Oxy IR/ROXICODONE) immediate release tablet 5 mg  5 mg Oral Q6H PRN Darlin Priestly, MD   5 mg at 02/23/23 0405   polyvinyl alcohol (LIQUIFILM TEARS) 1.4 % ophthalmic solution 1 drop  1 drop Both Eyes PRN Darlin Priestly, MD       potassium chloride SA (KLOR-CON M) CR tablet 40 mEq  40 mEq Oral Q3H Romie Minus, MD   40 mEq at 02/24/23 0945   pravastatin (PRAVACHOL) tablet 40 mg  40 mg Oral q1800 Darlin Priestly, MD   40 mg at 02/23/23 1817   QUEtiapine (SEROQUEL) tablet 25 mg  25 mg Oral  Shellia Cleverly, MD   25 mg at 02/23/23 2015     Objective: Vital signs in last 24 hours: Temp:  [98.1 F (36.7 C)-98.9 F (37.2 C)] 98.9 F (37.2 C) (12/12 0349) Pulse Rate:  [53-73] 53 (12/12 0812) Resp:  [16-18] 18 (12/12 0349) BP: (126-150)/(59-70) 150/70 (12/12 0812) SpO2:  [96 %-99 %] 99 % (12/12 0812) FiO2 (%):  [3 %-40 %] 40 % (12/12 0745) Weight:  [125.2 kg] 125.2 kg (12/12 0400)  Intake/Output from previous day: 12/11 0701 - 12/12 0700 In: 560 [P.O.:460] Out: 3000 [Urine:3000] Intake/Output this shift: No intake/output data recorded.  Physical Exam Vitals and nursing note reviewed.  Constitutional:      General: He is sleeping. He is not in acute distress. HENT:     Head: Normocephalic and atraumatic.  Pulmonary:     Effort: Pulmonary effort is normal. No respiratory distress.  Skin:    General: Skin is warm and dry.    Lab Results:  Recent Labs    02/23/23 0329  02/24/23 0412  WBC 9.3 8.6  HGB 12.9* 12.8*  HCT 41.4 41.5  PLT 199 233   BMET Recent Labs    02/23/23 1305 02/24/23 0412  NA 139 141  K 3.0* 3.2*  CL 81* 88*  CO2 42* 38*  GLUCOSE 165* 143*  BUN 80* 75*  CREATININE 2.87* 2.35*  CALCIUM 9.5 9.5   ABG Recent Labs    02/22/23 1316 02/23/23 1305  PHART 7.48*  --   HCO3 60.3* 56.2*   Studies/Results: No results found.  Assessment & Plan: 77 year old male who developed urinary retention during admission with CHF.  Urologic history notable for AUS around 1997 after TURP around 1995.  He developed recurrent gross hematuria due to traumatic Foley removal, however blood counts are stable and his hematuria is improving.  No further intervention indicated for this.  As per prior note, we have concerns that prolonged Foley catheterization will increase his risk for erosion of the artificial urinary sphincter.  Would recommend voiding trial tomorrow.  If he is unable to void, would manage his bladder with I&O catheterization and engage IR for suprapubic catheter placement.  I will place an outpatient referral to see Dr. Sherron Monday at [facility], who manages official urinary sphincters.  Carman Ching, PA-C 02/24/2023

## 2023-02-24 NOTE — Progress Notes (Addendum)
PROGRESS NOTE    Tony Williams  NWG:[id number] DOB: 12-07-1945 DOA: 02/14/2023 PCP: Erasmo Downer, MD   Assessment & Plan:   Principal Problem:   Acute on chronic diastolic CHF (congestive heart failure) (HCC) Active Problems:   Acute on chronic respiratory failure with hypoxia (HCC)   CAD (coronary artery disease)   Myocardial injury   Chronic obstructive pulmonary disease (HCC)   Blurry vision, bilateral   Hypertension associated with diabetes (HCC)   Hyperlipidemia associated with type 2 diabetes mellitus (HCC)   Type II diabetes mellitus with renal manifestations (HCC)   Stage 3b chronic kidney disease (CKD) (HCC)   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 Likely secondary to acute on chronic CHF exacerbation.    Acute on chronic diastolic CHF: echo on 10/29/2021 showed EF 50-55% with grade I diastolic dysfunction.  3+ leg edema, 15 pounds weight gain, elevated BNP 627, clinically consistent with CHF exacerbation. Holding diuretic still. Start hydralazine, imdur as per cardio. No GDMT w/ AKI on CKD as per cardio.    Contraction alkalosis: improved from day prior. Will continue to monitor    Hx  of CAD: continue on statin, aspirin. Troponin elevation secondary to demand ischemia. Not a candidate for cardiac cath as per cardio    Urinary retention: pt pulled out foley cath on 12/10 and now w/ hematuria and clots. Foley placed again 02/23/23 but a voiding trial tomorrow and if the pt fails will manage the pt w/ I/O cath as prolonged foley will increase risk for erosion of the artificial urinary sphincter & engage IR for suprapubic cath placement as per uro.  Outpatient referral for Dr. Caryl Pina in [facility] was placed by inpatient uro.    Bacteruria: no other signs/symptoms of UTI. No need to treat for UTI    Hospital delirium: continue on seroquel    COPD: w/o exacerbation. Bronchodilators prn    Blurry vision, bilateral: etiology unclear. It has been going on for more than 3 weeks intermittently.  No unilateral weakness in extremities.  No facial droop or slurred speech.  I offered to do MRI, but the patient has claustrophobia, need open MRI.  Patient states that he wants to see eye doctor first before doing MRI. CT head neg.    HTN: continue on imdur, hydralazine. Holding amlodipine, coreg  HLD: continue on statin   DM2: HbA1c 6.3, well controlled   Hypokalemia: potassium given    AKI on CKDIIIb: Cr is labile. Avoid nephrotoxic meds   Chronic gout: continue on allopurinol. Holding colchicine    Depression: severity unknown. Continue on home dose of lexapro    OSA: CPAP qhs   Morbid obesity: BMI 40.4. Complicates overall care & prognsosis    Somnolence: continue w/ CPAP at bedtime & naps. Holding home dose of lyrica, gabapentin    Left knee osteoarthritis: continue w/ lidocaine patch. Oxy prn         DVT prophylaxis: heparin  Code Status: full  Family Communication: discussed pt's care w/ pt's  wife at bedside and answered her questions  Disposition Plan: likely d/c to SNF   Level of care: Telemetry Cardiac Status is: Inpatient Remains inpatient appropriate because: severity  of illness    Consultants:  Urology  Cardio   Procedures:   Antimicrobials:   Subjective: Pt c/o malaise   Objective: Vitals:   02/24/23 0324 02/24/23 0349 02/24/23 0400 02/24/23 0812  BP:  135/61  (!) 150/70  Pulse: 64 67  (!) 53  Resp:  18    Temp:  98.9 F (37.2 C)    TempSrc:  Axillary    SpO2: 96% 97%  99%  Weight:   125.2 kg   Height:        Intake/Output Summary (Last 24 hours) at 02/24/2023  0842 Last data filed at 02/24/2023 0545 Gross per 24 hour  Intake 560 ml  Output 3000 ml  Net -2440 ml   Filed Weights   02/22/23 0500 02/23/23 0424 02/24/23 0400  Weight: 124.4 kg 128 kg 125.2 kg    Examination:  General exam: Appears lethargic  Respiratory system: clear breath sounds b/l Cardiovascular system: S1/S2+. No rubs or clicks  Gastrointestinal system: Abd is soft, NT, obese & hypoactive bowel sounds  Central nervous system: lethargic. Moves all extremities  Psychiatry: Judgement and insight appears not at baseline. Flat mood and affect    Data Reviewed: I have personally reviewed following labs and imaging studies  CBC: Recent Labs  Lab 02/20/23 0429 02/23/23 0329 02/24/23 0412  WBC 7.3 9.3 8.6  HGB 12.2* 12.9* 12.8*  HCT 39.7 41.4 41.5  MCV 91.7 90.8 91.0  PLT 198 199 233   Basic Metabolic Panel: Recent Labs  Lab 02/21/23 0422 02/22/23 0330 02/22/23 1702 02/23/23 0329 02/23/23 1305 02/24/23 0412  NA 142 137 139 142 139 141  K 3.5 4.0 3.1* 2.9* 3.0* 3.2*  CL 85* 79* 79* 83* 81* 88*  CO2 41* 38* 41* 42* 42* 38*  GLUCOSE 95 204* 164* 133* 165* 143*  BUN 76* 77* 89* 85* 80* 75*  CREATININE 2.29* 2.82* 2.81* 2.84* 2.87* 2.35*  CALCIUM 9.1 9.8 9.3 9.5 9.5 9.5  MG 2.4 2.5*  --  2.7*  --  3.0*   GFR: Estimated Creatinine Clearance: 35.5 mL/min (A) (by C-G formula based on SCr of 2.35 mg/dL (H)). Liver Function Tests: No results for input(s): "AST", "ALT", "ALKPHOS", "BILITOT", "PROT", "ALBUMIN" in the last 168 hours. No results for input(s): "LIPASE", "AMYLASE" in the last 168 hours. Recent Labs  Lab 02/18/23 0946  AMMONIA 33   Coagulation Profile: No results for input(s): "INR", "PROTIME" in the last 168 hours. Cardiac Enzymes: No results for input(s): "CKTOTAL", "CKMB", "CKMBINDEX", "TROPONINI" in the last 168 hours. BNP (last 3 results) No results for input(s): "PROBNP" in the last 8760 hours. HbA1C: No results for input(s): "HGBA1C" in  the last 72 hours. CBG: Recent Labs  Lab 02/19/23 0757 02/19/23 1134 02/19/23 1552 02/20/23 0021 02/24/23 0814  GLUCAP 100* 135* 153* 117* 145*   Lipid Profile: No results for input(s): "CHOL", "HDL", "LDLCALC", "TRIG", "CHOLHDL", "LDLDIRECT" in the last 72 hours. Thyroid Function Tests: No results for input(s): "TSH", "T4TOTAL", "FREET4", "T3FREE", "THYROIDAB" in the last 72 hours. Anemia Panel: No results for input(s): "VITAMINB12", "FOLATE", "FERRITIN", "TIBC", "IRON", "RETICCTPCT" in the last 72 hours. Sepsis Labs: Recent Labs  Lab 02/17/23 1255 02/17/23 1604  LATICACIDVEN 0.7 0.6    Recent Results (from the past 240 hours)  SARS Coronavirus 2 by RT PCR (hospital order, performed in [facility] hospital lab) *cepheid single result test* Anterior Nasal Swab     Status: None   Collection Time: 02/14/23  7:01 PM   Specimen: Anterior  Nasal Swab  Result Value Ref Range Status   SARS Coronavirus 2 by RT PCR NEGATIVE NEGATIVE Final    Comment: (NOTE) SARS-CoV-2 target nucleic acids are NOT DETECTED.  The SARS-CoV-2 RNA is generally detectable in upper and lower respiratory specimens during the acute phase of infection. The lowest concentration of SARS-CoV-2 viral copies this assay can detect is 250 copies / mL. A negative result does not preclude SARS-CoV-2 infection and should not be used as the sole basis for treatment or other patient management decisions.  A negative result may occur with improper specimen collection / handling, submission of specimen other than nasopharyngeal swab, presence of viral mutation(s) within the areas targeted by this assay, and inadequate number of viral copies (<250 copies / mL). A negative result must be combined with clinical observations, patient history, and epidemiological information.  Fact Sheet for Patients:   RoadLapTop.co.za  Fact Sheet for Healthcare  Providers: http://kim-miller.com/  This test is not yet approved or  cleared by the [place] FDA and has been authorized for detection and/or diagnosis of SARS-CoV-2 by FDA under an Emergency Use Authorization (EUA).  This EUA will remain in effect (meaning this test can be used) for the duration of the COVID-19 declaration under Section 564(b)(1) of the Act, 21 U.S.C. section 360bbb-3(b)(1), unless the authorization is terminated or revoked sooner.  Performed at [facility], [address] [place], [place] [address]          Radiology Studies: No results found.      Scheduled Meds:  allopurinol  300 mg Oral Daily   aspirin EC  81 mg Oral Daily   Chlorhexidine Gluconate Cloth  6 each Topical Daily   cholecalciferol  4,000 Units Oral Daily   escitalopram  20 mg Oral Daily   ezetimibe  10 mg Oral Daily   fesoterodine  4 mg Oral Daily   heparin injection (subcutaneous)  5,000 Units Subcutaneous Q8H   lidocaine  2 patch Transdermal Q24H   loratadine  10 mg Oral Daily   pravastatin  40 mg Oral q1800   QUEtiapine  25 mg Oral QHS   Continuous Infusions:   LOS: 10 days      Charise Killian, MD [facility] Pager [phone]  If 7PM-7AM, please contact night-coverage [email or web address] 02/24/2023, 8:42 AM

## 2023-02-24 NOTE — TOC Progression Note (Addendum)
Transition of Care [facility]) - Progression Note    Patient Details  Name: Tony Williams MRN: [id number] Date of Birth: Aug 08, 1945  [facility] of [facility]) CM/SW Contact  Margarito Liner, LCSW Phone Number: 02/24/2023, 1:43 PM  Clinical Narrative:  Received confirmation from [facility] coordinator that they can offer patient a bed. CSW left voicemail for wife to notify. [facility] is aware of bipap and potential need for suprapubic catheter placement.  2:37 pm: Received call back from wife. Provided update. CSW called and updated daughter as well. They are agreeable to plan.  Expected Discharge Plan and Services                                               Social Determinants of Health (SDOH) Interventions SDOH Screenings   Food Insecurity: No Food Insecurity (02/15/2023)  Housing: Low Risk  (02/24/2023)  Transportation Needs: No Transportation Needs (02/18/2023)  Utilities: Not At Risk (02/15/2023)  Alcohol Screen: Low Risk  (08/10/2022)  Depression (PHQ2-9): Low Risk  (08/10/2022)  Financial Resource Strain: Low Risk  (02/18/2023)  Physical Activity: Inactive (05/19/2021)  Social Connections: Unknown (05/19/2021)  Stress: No Stress Concern Present (05/19/2021)  Recent Concern: Stress - Stress Concern Present (02/26/2021)  Tobacco Use: Medium Risk (02/18/2023)    Readmission Risk Interventions    02/15/2023    9:37 AM 12/11/2020   11:06 AM  Readmission Risk Prevention Plan  Transportation Screening Complete Complete  PCP or Specialist Appt within 5-7 Days  Complete  PCP or Specialist Appt within 3-5 Days Complete   Medication Review (RN CM)  Complete  HRI or Home Care Consult Complete   Social Work Consult for Recovery Care Planning/Counseling Complete   Palliative Care Screening Not Applicable   Medication Review Oceanographer) Not Complete   Med Review Comments patient will discuss medication upon discharge

## 2023-02-25 DIAGNOSIS — I5033 Acute on chronic diastolic (congestive) heart failure: Secondary | ICD-10-CM | POA: Diagnosis not present

## 2023-02-25 DIAGNOSIS — I5021 Acute systolic (congestive) heart failure: Secondary | ICD-10-CM | POA: Diagnosis not present

## 2023-02-25 LAB — CBC
HCT: 39.3 % (ref 39.0–52.0)
HCT: 39.3 % (ref 39.0–52.0)
Hemoglobin: 12.4 g/dL — ABNORMAL LOW (ref 13.0–17.0)
Hemoglobin: 12.6 g/dL — ABNORMAL LOW (ref 13.0–17.0)
MCH: 28.2 pg (ref 26.0–34.0)
MCH: 28.3 pg (ref 26.0–34.0)
MCHC: 31.6 g/dL (ref 30.0–36.0)
MCHC: 32.1 g/dL (ref 30.0–36.0)
MCV: 89.3 fL (ref 80.0–100.0)
MCV: 88.3 fL (ref 80.0–100.0)
Platelets: 216 10*3/uL (ref 150–400)
Platelets: 212 10*3/uL (ref 150–400)
RBC: 4.4 MIL/uL (ref 4.22–5.81)
RBC: 4.45 MIL/uL (ref 4.22–5.81)
RDW: 15.9 % — ABNORMAL HIGH (ref 11.5–15.5)
RDW: 15.8 % — ABNORMAL HIGH (ref 11.5–15.5)
WBC: 8.3 10*3/uL (ref 4.0–10.5)
WBC: 8.3 10*3/uL (ref 4.0–10.5)
nRBC: 0 % (ref 0.0–0.2)
nRBC: 0 % (ref 0.0–0.2)

## 2023-02-25 LAB — BASIC METABOLIC PANEL
Anion gap: 12 (ref 5–15)
BUN: 70 mg/dL — ABNORMAL HIGH (ref 8–23)
CO2: 34 mmol/L — ABNORMAL HIGH (ref 22–32)
Calcium: 9.7 mg/dL (ref 8.9–10.3)
Chloride: 90 mmol/L — ABNORMAL LOW (ref 98–111)
Creatinine, Ser: 2.2 mg/dL — ABNORMAL HIGH (ref 0.61–1.24)
GFR, Estimated: 30 mL/min — ABNORMAL LOW (ref >60)
Glucose, Bld: 129 mg/dL — ABNORMAL HIGH (ref 70–99)
Potassium: 3.4 mmol/L — ABNORMAL LOW (ref 3.5–5.1)
Sodium: 136 mmol/L (ref 135–145)

## 2023-02-25 LAB — MAGNESIUM: Magnesium: 2.7 mg/dL — ABNORMAL HIGH (ref 1.7–2.4)

## 2023-02-25 MED ORDER — MORPHINE SULFATE (PF) 2 MG/ML IV SOLN: 2.0000 mg | Freq: Once | INTRAVENOUS | Status: DC

## 2023-02-25 MED ORDER — FENTANYL CITRATE PF 50 MCG/ML IJ SOSY
12.5000 ug | PREFILLED_SYRINGE | Freq: Once | INTRAMUSCULAR | Status: AC
  Administered 2023-02-25: 12.5 ug via INTRAVENOUS
  Filled 2023-02-25: qty 1

## 2023-02-25 MED ORDER — SODIUM CHLORIDE 0.9 % IR SOLN
3000.0000 mL | Status: DC
  Administered 2023-02-25 – 2023-02-26 (×2): 3000 mL

## 2023-02-25 MED ORDER — POTASSIUM CHLORIDE CRYS ER 20 MEQ PO TBCR
20.0000 meq | EXTENDED_RELEASE_TABLET | Freq: Once | ORAL | Status: AC
  Administered 2023-02-25: 20 meq via ORAL
  Filled 2023-02-25: qty 1

## 2023-02-25 NOTE — Progress Notes (Signed)
Refusing to wear BIPAP. Patient has yanked off bipap multiple times since placed on by respiratory. Daughter is at bedside. Placed back on oxygen .

## 2023-02-25 NOTE — Progress Notes (Signed)
I and Out Cath performed per protocol 2/2 pt's inability to spontaneously urinate and bladder scan of . Catheter passed without resistance and frank red blood began to trickle via catheter and around it. NP B. Jon Billings, provider on-call, came to bedside and observed pt's output. of blood in I and O Cath.

## 2023-02-25 NOTE — Progress Notes (Signed)
Pt refusing Bipap. Says it "smothers me"  Informed of risks of not wearing mask.  Informed Dr. Mayford Knife.  Erick Blinks, RN

## 2023-02-25 NOTE — Progress Notes (Signed)
PROGRESS NOTE    Tony Williams  AVW:[id number] DOB: [birth year] DOA: 02/14/2023 PCP: Erasmo Downer, MD   Assessment & Plan:   Principal Problem:   Acute on chronic diastolic CHF (congestive heart failure) (HCC) Active Problems:   Acute on chronic respiratory failure with hypoxia (HCC)   CAD (coronary artery disease)   Myocardial injury   Chronic obstructive pulmonary disease (HCC)   Blurry vision, bilateral   Hypertension associated with diabetes (HCC)   Hyperlipidemia associated with type 2 diabetes mellitus (HCC)   Type II diabetes mellitus with renal manifestations (HCC)   Stage 3b chronic kidney disease (CKD) (HCC)   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 Likely secondary to acute on chronic CHF exacerbation.    Acute on chronic diastolic CHF: echo on 10/29/2021 showed EF 50-55% with grade I diastolic dysfunction.  3+ leg edema, 15 pounds weight gain, elevated BNP 627, clinically consistent with CHF exacerbation. Holding diuretic still. Continue on imdur, hydralazine as per cardio. No GDMT w/ AKI on CKD as per cardio.    Contraction alkalosis: slightly improved from day prior. Will continue to monitor    Hx  of CAD: continue on statin, aspirin. Troponin elevation secondary to demand ischemia. Not a candidate for cardiac cath as per cardio    Urinary retention: pt pulled out foley cath on 12/10 and now w/ hematuria and clots. Foley placed again 02/23/23. D/c foley 02/24/13 & voiding trial & if the pt fails will manage the pt w/ I/O cath as prolonged foley will increase risk for erosion of the artificial urinary sphincter & engage IR for suprapubic cath  placement as per uro. Outpatient referral for Dr. Caryl Pina in [facility] was placed by inpatient uro.    Bacteruria: no other signs/symptoms of UTI. No need to treat for UTI    Hospital delirium: continue on seroquel     COPD: w/o exacerbation. Bronchodilators prn    Blurry vision, bilateral: etiology unclear. It has been going on for more than 3 weeks intermittently.  No unilateral weakness in extremities.  No facial droop or slurred speech.  I offered to do MRI, but the patient has claustrophobia, need open MRI.  Patient states that he wants to see eye doctor first before doing MRI. CT head neg.    HTN: continue on imdur, hydralazine. Holding amlodipine, coreg  HLD: continue on statin   DM2: HbA1c 6.3, well controlled   Hypokalemia: potassium given    AKI on CKDIIIb: Cr is trending down from day prior. Avoid nephrotoxic meds.   Chronic gout: continue on allopurinol. Holding colchicine    Depression: severity unknown. Continue on home dose of lexapro    OSA: CPAP qhs   Morbid obesity: BMI 40.4. Complicates overall care & prognosis    Somnolence: continue w/ CPAP at bedtime & naps. Holding home dose of lyrica, gabapentin    Left knee osteoarthritis: continue w/ lidocaine patch. Oxy prn       DVT prophylaxis: heparin  Code Status: full  Family Communication: discussed pt's care w/ pt's  wife at bedside and answered her questions  Disposition Plan: likely d/c to SNF   Level of care: Telemetry Cardiac Status is:  Inpatient Remains inpatient appropriate because: severity of illness    Consultants:  Urology  Cardio   Procedures:   Antimicrobials:   Subjective: Pt c/o generalized pain & "wants pain meds to knock me out."  Objective: Vitals:   02/24/23 2341 02/25/23 0428 02/25/23 0500 02/25/23 0824  BP: 136/65 (!) 144/68  (!) 125/53  Pulse:  66  64  Resp:  19  16  Temp: 98.2 F (36.8 C) 98.5 F (36.9 C)  98.1 F (36.7 C)  TempSrc: Oral      SpO2: 96% 97%  94%  Weight:   126.5 kg   Height:        Intake/Output Summary (Last 24 hours) at 02/25/2023 0838 Last data filed at 02/25/2023 1610 Gross per 24 hour  Intake 240 ml  Output 1825 ml  Net -1585 ml   Filed Weights   02/23/23 0424 02/24/23 0400 02/25/23 0500  Weight: 128 kg 125.2 kg 126.5 kg    Examination:  General exam: appears lethargic  Respiratory system: decreased breath sounds b/l  Cardiovascular system: S1 & S2+ Gastrointestinal system:abd is soft, NT, obese 7 hypoactive bowel sounds  Central nervous system: lethargic. Moves all extremities  Psychiatry: Judgement and insight appears poor. Flat mood and affect    Data Reviewed: I have personally reviewed following labs and imaging studies  CBC: Recent Labs  Lab 02/20/23 0429 02/23/23 0329 02/24/23 0412 02/25/23 0440  WBC 7.3 9.3 8.6 8.3  HGB 12.2* 12.9* 12.8* 12.4*  HCT 39.7 41.4 41.5 39.3  MCV 91.7 90.8 91.0 89.3  PLT 198 199 233 216   Basic Metabolic Panel: Recent Labs  Lab 02/21/23 0422 02/22/23 0330 02/22/23 1702 02/23/23 0329 02/23/23 1305 02/24/23 0412 02/25/23 0440  NA 142 137 139 142 139 141 136  K 3.5 4.0 3.1* 2.9* 3.0* 3.2* 3.4*  CL 85* 79* 79* 83* 81* 88* 90*  CO2 41* 38* 41* 42* 42* 38* 34*  GLUCOSE 95 204* 164* 133* 165* 143* 129*  BUN 76* 77* 89* 85* 80* 75* 70*  CREATININE 2.29* 2.82* 2.81* 2.84* 2.87* 2.35* 2.20*  CALCIUM 9.1 9.8 9.3 9.5 9.5 9.5 9.7  MG 2.4 2.5*  --  2.7*  --  3.0* 2.7*   GFR: Estimated Creatinine Clearance: 38.1 mL/min (A) (by C-G formula based on SCr of 2.2 mg/dL (H)). Liver Function Tests: No results for input(s): "AST", "ALT", "ALKPHOS", "BILITOT", "PROT", "ALBUMIN" in the last 168 hours. No results for input(s): "LIPASE", "AMYLASE" in the last 168 hours. Recent Labs  Lab 02/18/23 0946  AMMONIA 33   Coagulation Profile: No results for input(s): "INR", "PROTIME" in the last 168 hours. Cardiac Enzymes: No results for input(s):  "CKTOTAL", "CKMB", "CKMBINDEX", "TROPONINI" in the last 168 hours. BNP (last 3 results) No results for input(s): "PROBNP" in the last 8760 hours. HbA1C: No results for input(s): "HGBA1C" in the last 72 hours. CBG: Recent Labs  Lab 02/19/23 0757 02/19/23 1134 02/19/23 1552 02/20/23 0021 02/24/23 0814  GLUCAP 100* 135* 153* 117* 145*   Lipid Profile: No results for input(s): "CHOL", "HDL", "LDLCALC", "TRIG", "CHOLHDL", "LDLDIRECT" in the last 72 hours. Thyroid Function Tests: No results for input(s): "TSH", "T4TOTAL", "FREET4", "T3FREE", "THYROIDAB" in the last 72 hours. Anemia Panel: No results for input(s): "VITAMINB12", "FOLATE", "FERRITIN", "TIBC", "IRON", "RETICCTPCT" in the last 72 hours. Sepsis Labs: No results for input(s): "PROCALCITON", "LATICACIDVEN" in the last 168 hours.   No results found for this or any previous visit (from the past 240 hours).  Radiology Studies: No results found.      Scheduled Meds:  allopurinol  300 mg Oral Daily   aspirin EC  81 mg Oral Daily   Chlorhexidine Gluconate Cloth  6 each Topical Daily   cholecalciferol  4,000 Units Oral Daily   escitalopram  20 mg Oral Daily   ezetimibe  10 mg Oral Daily   fesoterodine  4 mg Oral Daily   heparin injection (subcutaneous)  5,000 Units Subcutaneous Q8H   hydrALAZINE  10 mg Oral Q8H   isosorbide mononitrate  30 mg Oral Daily   lidocaine  2 patch Transdermal Q24H   loratadine  10 mg Oral Daily   pravastatin  40 mg Oral q1800   QUEtiapine  25 mg Oral QHS   Continuous Infusions:   LOS: 11 days      Charise Killian, MD [facility] Pager [phone]  If 7PM-7AM, please contact night-coverage [email or web address] 02/25/2023, 8:38 AM

## 2023-02-25 NOTE — Progress Notes (Signed)
Advanced Heart Failure Rounding Note  PCP-Cardiologist: Debbe Odea, MD   Subjective:   Admitted with acute systolic heart failure.   Did not wear his BiPAP overnight. Renal function slowly improving, plan to restart oral diuretics today. Awaiting urology plan and placement at discharge. HF follow up arranged.   Objective:   Weight Range: 126.5 kg Body mass index is 40.02 kg/m.   Vital Signs:   Temp:  [98.1 F (36.7 C)-98.6 F (37 C)] 98.1 F (36.7 C) (12/13 0824) Pulse Rate:  [64-68] 64 (12/13 0824) Resp:  [16-19] 16 (12/13 0824) BP: (125-176)/(53-68) 125/53 (12/13 0824) SpO2:  [94 %-99 %] 94 % (12/13 0824) FiO2 (%):  [40 %] 40 % (12/12 2316) Weight:  [126.5 kg] 126.5 kg (12/13 0500) Last BM Date : 02/23/23  Weight change: Filed Weights   02/23/23 0424 02/24/23 0400 02/25/23 0500  Weight: 128 kg 125.2 kg 126.5 kg   Intake/Output:   Intake/Output Summary (Last 24 hours) at 02/25/2023 1010 Last data filed at 02/25/2023 0981 Gross per 24 hour  Intake 240 ml  Output 1825 ml  Net -1585 ml    Physical Exam  General:  elderly appearing.  No respiratory difficulty HEENT: NCAT Neck: supple. JVD ~6 cm. Carotids 2+ bilat; no bruits. No lymphadenopathy or thyromegaly appreciated. Cor: PMI nondisplaced. Regular rate & rhythm. No rubs, gallops or murmurs. Lungs: diminished bases Abdomen: soft, nontender, nondistended. No hepatosplenomegaly. No bruits or masses. Extremities: no cyanosis, clubbing, rash, trace LE edema Neuro: alert & oriented to person and place, moaning.   Patient Profile   Tony Williams is a 77 y.o. male with a history of CAD status post four-vessel bypass in 2007 with subsequent interventions, chronic HFpEF, hypertension, hyperlipidemia, statin intolerance, obesity, diabetes, remote tobacco abuse, COPD, sleep apnea (not using CPAP), and stage IIIB chronic kidney disease who presented with acute on chronic systolic heart  failure.  Assessment/Plan  1. Acute systolic HF - Echo this admit. Difficult images EF ~35% (not well seen) RV not seen well but does not appear to be significantly dilated - Not a candidate for coronary angiography. RHC unlikely to change current clinical trajectory as he appears better compensated from a cardiac standpoint - No GDMT with AKI on CKD, SGLT-2 with potential long term foley need, BB with bradycardia - Continue hydralazine/imdur - Restart oral torsemide 40mg  daily   2. CAD  - Last cath 8/22 with new occlusion of SVG-OM and high-grade stenosis of SVG -> LAD -> D2 requiring high-risk PCI and IABP support  - Continue ASA/statin   3. Acute on chronic kidney injury CKD 3b at baseline - Baseline SCr ~2.5 - Improving - Restart oral torsemide 40mg  daily - Followed by Dr. Thedore Mins CKD   4. Acute on chronic hypoxic/hypercarbic respiratory failure - suspect primarily OSA/OHS but also HF - continue CPAP. Diurese   5. Metabolic encephalopathy - suspect combination of CO2 retention and possible early uremia. Slowly improving - continue Bipap overnight   6. Morbid obesity -Body mass index is 40.02 kg/m.  - consider GLP1RA as outpatient   6. Severe debility - needs PT/OT - likely needs short-term rehab/SNF at d/c - Poor long term prognosis, patient may benefit from palliative care consultation   7. DM2 - Per primary  Discharge medication recommendations:  - Hydralazine 10mg  TID - Imdur 30mg  daily - Torsemide 40mg  daily  Heart failure will sign off at this time. Follow up arranged with HF NP in clinic.  Length of Stay: 11  Romie Minus, MD  02/25/2023, 10:10 AM  Advanced Heart Failure Team Pager [phone] (M-F; 7a - 5p)  Please contact [facility] for night-coverage after hours (5p -7a ) and weekends on [email or web address]

## 2023-02-25 NOTE — Progress Notes (Signed)
       CROSS COVER NOTE  NAME: Tony Williams MRN: [id number] DOB : 1945/11/11    Concern as stated by nurse / staff   Called to room RN performing in and out foley catheter for no urine output and bladder volume above 400. Bright red blood return.     Pertinent findings on chart review: Patient admitted with frank hematuria after traumatic foley Removal. Has hx TURP 1995 and artificial urinary sphincter placement in 1997. Indwelling foley removed today for voiding trial. Plans was for SP catheter placement in IR if voiding trial fails  Assessment and  Interventions   Assessment:    02/25/2023    8:53 PM 02/25/2023    8:17 PM 02/25/2023    4:48 PM  Vitals with BMI  Systolic 149 141 696  Diastolic 62 74 62  Pulse 66 70 66   Frank blood return witness in catheter bag. Nurse denies resistance with catheter insertion Patient states he is having a lot of discomfort in his lower abdomen Wife expresses need for SP catheter placement as she feels putting him through in and out caths is torturing him  Plan: Dr Marlou Porch paged - informed him of above events.  - per his rec 22 Fr 3 way foley to be placed and CBI initiated - and he will see tomorrow CBC ordered - hgb stable 12.6       Donnie Mesa NP [facility] Cross Cover 7pm-7am - check amion for availability Pager [phone]

## 2023-02-25 NOTE — Plan of Care (Signed)
  Problem: Education: Goal: Ability to describe self-care measures that may prevent or decrease complications (Diabetes Survival Skills Education) will improve Outcome: Progressing Goal: Individualized Educational Video(s) Outcome: Progressing   Problem: Coping: Goal: Ability to adjust to condition or change in health will improve Outcome: Progressing   

## 2023-02-25 NOTE — Progress Notes (Signed)
Physical Therapy Treatment Patient Details Name: Tony Williams MRN: [id number] DOB: August 15, 1945 Today's Date: 02/25/2023   History of Present Illness Mr. Ekrem Cantarella is a 77 year old male with history of CAD status post CABG and subsequent PCI's, chronic HFpEF, hypertension, hyperlipidemia, type 2 diabetes mellitus, statin intolerance, COPD, CKD stage III, and obstructive sleep apnea, who presented with SOB as well as 3-week history of progressive lower extremity edema and shortness of breath associated with a 20 pound weight gain.    PT Comments  Pt with max encouragement to participate.  Cleared by RN to try.  He transitions to sitting with rails and supervision.  Sat with BUE support and did agree to stand x 1 with RW and min a x 2 for general safety.  He takes a few small sidesteps but returns to supine due to fatigue.  Overall pt continues with good strength and mobility but generally feeling poorly is primary barrier.   If plan is discharge home, recommend the following: Two people to help with walking and/or transfers;Two people to help with bathing/dressing/bathroom;Help with stairs or ramp for entrance;Assist for transportation;Assistance with cooking/housework   Can travel by private vehicle        Equipment Recommendations       Recommendations for Other Services       Precautions / Restrictions Precautions Precautions: Fall Restrictions Weight Bearing Restrictions Per Provider Order: No     Mobility  Bed Mobility Overal bed mobility: Needs Assistance Bed Mobility: Supine to Sit, Sit to Supine Rolling: Used rails, Min assist   Supine to sit: Min assist Sit to supine: Supervision     Patient Response: Flat affect  Transfers Overall transfer level: Needs assistance Equipment used: Rolling walker (2 wheels) Transfers: Sit to/from Stand            Lateral/Scoot Transfers: Min assist, +2 safety/equipment      Ambulation/Gait         Gait  velocity: dec     General Gait Details: able to sidestep but no true gait   Stairs             Wheelchair Mobility     Tilt Bed Tilt Bed Patient Response: Flat affect  Modified Rankin (Stroke Patients Only)       Balance Overall balance assessment: Needs assistance Sitting-balance support: Single extremity supported Sitting balance-Leahy Scale: Good Sitting balance - Comments: holds bedrails for support                                    Cognition Arousal: Lethargic Behavior During Therapy: Flat affect Overall Cognitive Status: Difficult to assess                                 General Comments: limited conversation and only responds with heavy cues to do so        Exercises      General Comments        Pertinent Vitals/Pain Pain Assessment Pain Assessment: Faces Faces Pain Scale: Hurts little more Pain Location: back Pain Descriptors / Indicators: Sore, Discomfort Pain Intervention(s): Limited activity within patient's tolerance, Monitored during session, Repositioned    Home Living                          Prior Function  PT Goals (current goals can now be found in the care plan section) Progress towards PT goals: Not progressing toward goals - comment    Frequency    Min 1X/week      PT Plan      Co-evaluation              AM-PAC PT "6 Clicks" Mobility   Outcome Measure  Help needed turning from your back to your side while in a flat bed without using bedrails?: A Little Help needed moving from lying on your back to sitting on the side of a flat bed without using bedrails?: A Little Help needed moving to and from a bed to a chair (including a wheelchair)?: A Lot Help needed standing up from a chair using your arms (e.g., wheelchair or bedside chair)?: A Little Help needed to walk in hospital room?: A Lot Help needed climbing 3-5 steps with a railing? : Total 6 Click  Score: 14    End of Session Equipment Utilized During Treatment: Oxygen Activity Tolerance: Patient limited by fatigue;Other (comment) Patient left: in bed;with call bell/phone within reach;with bed alarm set Nurse Communication: Mobility status PT Visit Diagnosis: Other abnormalities of gait and mobility (R26.89);Muscle weakness (generalized) (M62.81);Difficulty in walking, not elsewhere classified (R26.2)     Time: 2130-8657 PT Time Calculation (min) (ACUTE ONLY): 9 min  Charges:    $Therapeutic Activity: 8-22 mins PT General Charges $$ ACUTE PT VISIT: 1 Visit                   Danielle Dess, PTA 02/25/23, 12:30 PM

## 2023-02-25 NOTE — Progress Notes (Signed)
Occupational Therapy Treatment Patient Details Name: Tony Williams MRN: [id number] DOB: [birth year] Today's Date: 02/25/2023   History of present illness [name] is a 77 year old male with history of CAD status post CABG and subsequent PCI's, chronic HFpEF, hypertension, hyperlipidemia, type 2 diabetes mellitus, statin intolerance, COPD, CKD stage III, and obstructive sleep apnea, who presented with SOB as well as 3-week history of progressive lower extremity edema and shortness of breath associated with a 20 pound weight gain.   OT comments  Mr Tony Williams was seen for OT treatment on this date. Upon arrival to room pt supine in bed with NT performing care, agreeable to limited tx. Pt requires MIN A don gown at bed level. MIN A sup>sit, good sitting balance. Pt endorses persistent dizziness t/o session, no change sup>sit. MIN A lateral scooting along HOB. Pt requesting to return to supine citing fatigue after <5 min sitting and attempts at seated exercises. SUP sit>sup. Pt making progress toward goals, will continue to follow POC. Discharge recommendation remains appropriate.        If plan is discharge home, recommend the following:  A lot of help with walking and/or transfers;A lot of help with bathing/dressing/bathroom;Help with stairs or ramp for entrance;Direct supervision/assist for medications management;Assist for transportation;Assistance with cooking/housework   Equipment Recommendations  BSC/3in1    Recommendations for Other Services      Precautions / Restrictions Precautions Precautions: Fall Restrictions Weight Bearing Restrictions Per Provider Order: No       Mobility Bed Mobility Overal bed mobility: Needs Assistance Bed Mobility: Supine to Sit, Sit to Supine     Supine to sit: Min assist Sit to supine: Supervision        Transfers Overall transfer level: Needs assistance   Transfers: Bed to chair/wheelchair/BSC            Lateral/Scoot  Transfers: Min assist General transfer comment: defers standing citing fatigue/dizziness (persistent t/o)     Balance Overall balance assessment: Needs assistance Sitting-balance support: No upper extremity supported, Feet supported Sitting balance-Leahy Scale: Good                                     ADL either performed or assessed with clinical judgement   ADL Overall ADL's : Needs assistance/impaired                                       General ADL Comments: MAX A don B socks in sitting. MIN A don gown at bed level      Cognition Arousal: Alert Behavior During Therapy: Flat affect Overall Cognitive Status: Within Functional Limits for tasks assessed                                                     Pertinent Vitals/ Pain       Pain Assessment Pain Assessment: Faces Faces Pain Scale: Hurts little more Pain Location: back Pain Descriptors / Indicators: Sore, Discomfort Pain Intervention(s): Limited activity within patient's tolerance, Repositioned   Frequency  Min 1X/week        Progress Toward Goals  OT Goals(current goals can now be found in the care plan section)  Progress  towards OT goals: Progressing toward goals  Acute Rehab OT Goals OT Goal Formulation: With patient/family Time For Goal Achievement: 03/08/23 Potential to Achieve Goals: Good ADL Goals Pt Will Perform Grooming: sitting;with supervision;with set-up Pt Will Perform Lower Body Dressing: sit to/from stand;with supervision;with set-up;with adaptive equipment Pt Will Transfer to Toilet: bedside commode;with supervision;with set-up;ambulating Pt Will Perform Toileting - Clothing Manipulation and hygiene: with adaptive equipment;with supervision;with set-up;sit to/from stand  Plan      Co-evaluation                 AM-PAC OT "6 Clicks" Daily Activity     Outcome Measure   Help from another person eating meals?: A Little Help  from another person taking care of personal grooming?: A Little Help from another person toileting, which includes using toliet, bedpan, or urinal?: A Lot Help from another person bathing (including washing, rinsing, drying)?: A Lot Help from another person to put on and taking off regular upper body clothing?: A Lot Help from another person to put on and taking off regular lower body clothing?: A Lot 6 Click Score: 14    End of Session    OT Visit Diagnosis: Other abnormalities of gait and mobility (R26.89);Muscle weakness (generalized) (M62.81)   Activity Tolerance Patient tolerated treatment well   Patient Left in bed;with call bell/phone within reach;with bed alarm set   Nurse Communication          Time: 7425-9563 OT Time Calculation (min): 10 min  Charges: OT General Charges $OT Visit: 1 Visit OT Treatments $Therapeutic Activity: 8-22 mins  Kathie Dike, M.S. OTR/L  02/25/23, 10:43 AM  ascom [phone]

## 2023-02-26 ENCOUNTER — Inpatient Hospital Stay: Payer: Medicare Other | Admitting: Anesthesiology

## 2023-02-26 ENCOUNTER — Inpatient Hospital Stay: Payer: Medicare Other

## 2023-02-26 ENCOUNTER — Encounter
Admission: EM | Disposition: A | Discharge status: Home / Self Care | Payer: Self-pay | Source: Home / Self Care | Attending: Internal Medicine

## 2023-02-26 DIAGNOSIS — I5023 Acute on chronic systolic (congestive) heart failure: Secondary | ICD-10-CM

## 2023-02-26 DIAGNOSIS — I25110 Atherosclerotic heart disease of native coronary artery with unstable angina pectoris: Secondary | ICD-10-CM | POA: Diagnosis not present

## 2023-02-26 DIAGNOSIS — I130 Hypertensive heart and chronic kidney disease with heart failure and stage 1 through stage 4 chronic kidney disease, or unspecified chronic kidney disease: Secondary | ICD-10-CM | POA: Diagnosis not present

## 2023-02-26 DIAGNOSIS — I5033 Acute on chronic diastolic (congestive) heart failure: Secondary | ICD-10-CM | POA: Diagnosis not present

## 2023-02-26 DIAGNOSIS — N3289 Other specified disorders of bladder: Secondary | ICD-10-CM | POA: Diagnosis not present

## 2023-02-26 DIAGNOSIS — I2510 Atherosclerotic heart disease of native coronary artery without angina pectoris: Secondary | ICD-10-CM | POA: Diagnosis not present

## 2023-02-26 DIAGNOSIS — I5021 Acute systolic (congestive) heart failure: Secondary | ICD-10-CM | POA: Diagnosis not present

## 2023-02-26 HISTORY — PX: INSERTION OF SUPRAPUBIC CATHETER: SHX5870

## 2023-02-26 HISTORY — PX: CYSTOSCOPY: SHX5120

## 2023-02-26 LAB — CBC
HCT: 38.3 % — ABNORMAL LOW (ref 39.0–52.0)
Hemoglobin: 12.2 g/dL — ABNORMAL LOW (ref 13.0–17.0)
MCH: 28.6 pg (ref 26.0–34.0)
MCHC: 31.9 g/dL (ref 30.0–36.0)
MCV: 89.9 fL (ref 80.0–100.0)
Platelets: 236 10*3/uL (ref 150–400)
RBC: 4.26 MIL/uL (ref 4.22–5.81)
RDW: 16 % — ABNORMAL HIGH (ref 11.5–15.5)
WBC: 11.9 10*3/uL — ABNORMAL HIGH (ref 4.0–10.5)
nRBC: 0 % (ref 0.0–0.2)

## 2023-02-26 LAB — BASIC METABOLIC PANEL
Anion gap: 11 (ref 5–15)
BUN: 61 mg/dL — ABNORMAL HIGH (ref 8–23)
CO2: 33 mmol/L — ABNORMAL HIGH (ref 22–32)
Calcium: 9.8 mg/dL (ref 8.9–10.3)
Chloride: 92 mmol/L — ABNORMAL LOW (ref 98–111)
Creatinine, Ser: 2.38 mg/dL — ABNORMAL HIGH (ref 0.61–1.24)
GFR, Estimated: 28 mL/min — ABNORMAL LOW (ref >60)
Glucose, Bld: 177 mg/dL — ABNORMAL HIGH (ref 70–99)
Potassium: 3.7 mmol/L (ref 3.5–5.1)
Sodium: 136 mmol/L (ref 135–145)

## 2023-02-26 LAB — GLUCOSE, CAPILLARY: Glucose-Capillary: 175 mg/dL — ABNORMAL HIGH (ref 70–99)

## 2023-02-26 SURGERY — CYSTOSCOPY
Anesthesia: General

## 2023-02-26 MED ORDER — ACETAMINOPHEN 10 MG/ML IV SOLN
INTRAVENOUS | Status: AC
  Filled 2023-02-26: qty 100

## 2023-02-26 MED ORDER — FENTANYL CITRATE (PF) 100 MCG/2ML IJ SOLN
INTRAMUSCULAR | Status: AC
  Filled 2023-02-26: qty 2

## 2023-02-26 MED ORDER — LIDOCAINE HCL (PF) 1 % IJ SOLN
INTRAMUSCULAR | Status: AC
  Filled 2023-02-26: qty 30

## 2023-02-26 MED ORDER — LIDOCAINE 1% INJECTION FOR CIRCUMCISION
INJECTION | INTRAVENOUS | Status: DC | PRN
  Administered 2023-02-26: 10 mL via SUBCUTANEOUS

## 2023-02-26 MED ORDER — SODIUM CHLORIDE 0.9 % IV BOLUS
1000.0000 mL | Freq: Once | INTRAVENOUS | Status: AC
  Administered 2023-02-26: 1000 mL via INTRAVENOUS

## 2023-02-26 MED ORDER — LIDOCAINE HCL (PF) 2 % IJ SOLN
INTRAMUSCULAR | Status: AC
  Filled 2023-02-26: qty 5

## 2023-02-26 MED ORDER — FENTANYL CITRATE PF 50 MCG/ML IJ SOSY
12.5000 ug | PREFILLED_SYRINGE | Freq: Once | INTRAMUSCULAR | Status: AC
  Administered 2023-02-26: 12.5 ug via INTRAVENOUS
  Filled 2023-02-26: qty 1

## 2023-02-26 MED ORDER — FENTANYL CITRATE (PF) 100 MCG/2ML IJ SOLN
25.0000 ug | INTRAMUSCULAR | Status: DC | PRN
  Administered 2023-02-26 (×2): 25 ug via INTRAVENOUS

## 2023-02-26 MED ORDER — ROCURONIUM BROMIDE 100 MG/10ML IV SOLN
INTRAVENOUS | Status: DC | PRN
  Administered 2023-02-26: 20 mg via INTRAVENOUS

## 2023-02-26 MED ORDER — PROPOFOL 10 MG/ML IV BOLUS
INTRAVENOUS | Status: AC
Start: 2023-02-26 — End: ?
  Filled 2023-02-26: qty 20

## 2023-02-26 MED ORDER — PROPOFOL 10 MG/ML IV BOLUS
INTRAVENOUS | Status: DC | PRN
  Administered 2023-02-26: 50 mg via INTRAVENOUS

## 2023-02-26 MED ORDER — DEXAMETHASONE SODIUM PHOSPHATE 10 MG/ML IJ SOLN
INTRAMUSCULAR | Status: AC
  Filled 2023-02-26: qty 1

## 2023-02-26 MED ORDER — SODIUM CHLORIDE 0.9 % IR SOLN
Status: DC | PRN
  Administered 2023-02-26 (×2): 3000 mL via INTRAVESICAL

## 2023-02-26 MED ORDER — PHENYLEPHRINE 80 MCG/ML (10ML) SYRINGE FOR IV PUSH (FOR BLOOD PRESSURE SUPPORT)
PREFILLED_SYRINGE | INTRAVENOUS | Status: AC
Start: 2023-02-26 — End: ?
  Filled 2023-02-26: qty 10

## 2023-02-26 MED ORDER — SUGAMMADEX SODIUM 200 MG/2ML IV SOLN
INTRAVENOUS | Status: DC | PRN
  Administered 2023-02-26: 200 mg via INTRAVENOUS

## 2023-02-26 MED ORDER — EPHEDRINE SULFATE-NACL 50-0.9 MG/10ML-% IV SOSY
PREFILLED_SYRINGE | INTRAVENOUS | Status: DC | PRN
  Administered 2023-02-26 (×2): 5 mg via INTRAVENOUS
  Administered 2023-02-26: 10 mg via INTRAVENOUS

## 2023-02-26 MED ORDER — SUCCINYLCHOLINE CHLORIDE 200 MG/10ML IV SOSY
PREFILLED_SYRINGE | INTRAVENOUS | Status: DC | PRN
  Administered 2023-02-26: 120 mg via INTRAVENOUS

## 2023-02-26 MED ORDER — ONDANSETRON HCL 4 MG/2ML IJ SOLN
INTRAMUSCULAR | Status: AC
  Filled 2023-02-26: qty 2

## 2023-02-26 MED ORDER — VASOPRESSIN 20 UNIT/ML IV SOLN
INTRAVENOUS | Status: DC | PRN
  Administered 2023-02-26: 1 [IU] via INTRAVENOUS

## 2023-02-26 MED ORDER — ONDANSETRON HCL 4 MG/2ML IJ SOLN
INTRAMUSCULAR | Status: DC | PRN
  Administered 2023-02-26: 4 mg via INTRAVENOUS

## 2023-02-26 MED ORDER — FENTANYL CITRATE (PF) 100 MCG/2ML IJ SOLN
INTRAMUSCULAR | Status: DC | PRN
  Administered 2023-02-26: 50 ug via INTRAVENOUS

## 2023-02-26 MED ORDER — LIDOCAINE HCL (CARDIAC) PF 100 MG/5ML IV SOSY
PREFILLED_SYRINGE | INTRAVENOUS | Status: DC | PRN
  Administered 2023-02-26: 60 mg via INTRAVENOUS

## 2023-02-26 MED ORDER — SODIUM CHLORIDE 0.9 % IV SOLN: INTRAVENOUS | Status: DC | PRN

## 2023-02-26 MED ORDER — OXYCODONE HCL 5 MG PO TABS
5.0000 mg | ORAL_TABLET | ORAL | Status: DC | PRN
  Administered 2023-02-26 – 2023-03-14 (×45): 5 mg via ORAL
  Filled 2023-02-26 (×47): qty 1

## 2023-02-26 MED ORDER — FENTANYL CITRATE PF 50 MCG/ML IJ SOSY
12.5000 ug | PREFILLED_SYRINGE | INTRAMUSCULAR | Status: DC | PRN
  Administered 2023-02-26: 12.5 ug via INTRAVENOUS
  Filled 2023-02-26: qty 1

## 2023-02-26 MED ORDER — CHLORHEXIDINE GLUCONATE CLOTH 2 % EX PADS
6.0000 | MEDICATED_PAD | Freq: Every day | CUTANEOUS | Status: DC
  Administered 2023-02-26: 6 via TOPICAL

## 2023-02-26 MED ORDER — TORSEMIDE 20 MG PO TABS: 20.0000 mg | ORAL_TABLET | Freq: Every day | ORAL | Status: DC

## 2023-02-26 MED ORDER — DEXTROSE 5 % IV SOLN
INTRAVENOUS | Status: DC | PRN
  Administered 2023-02-26: 3 g via INTRAVENOUS

## 2023-02-26 SURGICAL SUPPLY — 30 items
BAG DRAIN SIEMENS DORNER NS (MISCELLANEOUS) ×1 IMPLANT
BAG URINE DRAIN 2000ML AR STRL (UROLOGICAL SUPPLIES) IMPLANT
BLADE SURG 11 STRL SS SAFETY (MISCELLANEOUS) ×1 IMPLANT
BRUSH SCRUB EZ 1% IODOPHOR (MISCELLANEOUS) ×1 IMPLANT
CATH FOLEY 2W COUNCIL 5CC 16FR (CATHETERS) IMPLANT
CATH SET URETHRAL DILATOR (CATHETERS) IMPLANT
COUNTER NDL MAGNETIC 40 RED (SET/KITS/TRAYS/PACK) IMPLANT
COUNTER NEEDLE MAGNETIC 40 RED (SET/KITS/TRAYS/PACK) ×1 IMPLANT
GAUZE 4X4 16PLY ~~LOC~~+RFID DBL (SPONGE) ×2 IMPLANT
GLOVE BIO SURGEON STRL SZ 6.5 (GLOVE) ×1 IMPLANT
GLOVE BIO SURGEON STRL SZ7 (GLOVE) ×1 IMPLANT
GLOVE BIOGEL PI IND STRL 7.5 (GLOVE) ×1 IMPLANT
GOWN STRL REUS W/ TWL LRG LVL3 (GOWN DISPOSABLE) ×2 IMPLANT
GOWN STRL REUS W/ TWL XL LVL3 (GOWN DISPOSABLE) ×1 IMPLANT
GUIDEWIRE GREEN .038 145CM (MISCELLANEOUS) IMPLANT
IV NS IRRIG 3000ML ARTHROMATIC (IV SOLUTION) ×1 IMPLANT
NDL HYPO 25X1 1.5 SAFETY (NEEDLE) ×1 IMPLANT
NDL SPNL 18GX3.5 QUINCKE PK (NEEDLE) ×1 IMPLANT
NEEDLE HYPO 25X1 1.5 SAFETY (NEEDLE) ×1 IMPLANT
NEEDLE SPNL 18GX3.5 QUINCKE PK (NEEDLE) ×1 IMPLANT
PACK CYSTO AR (MISCELLANEOUS) ×1 IMPLANT
SET CYSTO W/LG BORE CLAMP LF (SET/KITS/TRAYS/PACK) ×1 IMPLANT
SET SUPRAPUBIC INTRO CHIOU (MISCELLANEOUS) IMPLANT
SPONGE DRAIN TRACH 4X4 STRL 2S (GAUZE/BANDAGES/DRESSINGS) ×1 IMPLANT
SURGILUBE 2OZ TUBE FLIPTOP (MISCELLANEOUS) ×1 IMPLANT
SUT SILK 0 SH 30 (SUTURE) ×1 IMPLANT
SYR 10ML LL (SYRINGE) ×2 IMPLANT
SYR TOOMEY IRRIG 70ML (MISCELLANEOUS) ×1 IMPLANT
SYRINGE TOOMEY IRRIG 70ML (MISCELLANEOUS) ×1 IMPLANT
WATER STERILE IRR 1000ML POUR (IV SOLUTION) ×1 IMPLANT

## 2023-02-26 NOTE — Transfer of Care (Signed)
Immediate Anesthesia Transfer of Care Note  Patient: Tony Williams  Procedure(s) Performed: CYSTOSCOPY CLOT EVACUATION INSERTION OF SUPRAPUBIC CATHETER  Patient Location: PACU  Anesthesia Type:General  Level of Consciousness: awake, alert , and oriented  Airway & Oxygen Therapy: Patient Spontanous Breathing  Post-op Assessment: Report given to RN and Post -op Vital signs reviewed and stable  Post vital signs: Reviewed and stable  Last Vitals:  Vitals Value Taken Time  BP 109/42 02/26/23 1817  Temp 36.6 C 02/26/23 1817  Pulse 91 02/26/23 1819  Resp    SpO2 99 % 02/26/23 1819  Vitals shown include unfiled device data.  Last Pain:  Vitals:   02/26/23 1817  TempSrc:   PainSc: 0-No pain      Patients Stated Pain Goal: 0 (02/26/23 1434)  Complications: No notable events documented.

## 2023-02-26 NOTE — Progress Notes (Signed)
Dr. Marlou Porch paged regarding:  1) pt diet order 2) clarification of SQ Heparin order.  Page promptly returned and MD verbalized to advance diet to pre-procedure order and also to hold SQ Heparin overnight (2200 and 0600 doses).

## 2023-02-26 NOTE — Progress Notes (Signed)
   02/26/23 0811  Assess: MEWS Score  Temp 98.1 F (36.7 C)  BP (!) 78/48  MAP (mmHg) (!) 58  Pulse Rate 82  ECG Heart Rate 82  Resp 18  Level of Consciousness Alert  SpO2 98 %  O2 Device Nasal Cannula  O2 Flow Rate (L/min) 3 L/min  Assess: MEWS Score  MEWS Temp 0  MEWS Systolic 2  MEWS Pulse 0  MEWS RR 0  MEWS LOC 0  MEWS Score 2  MEWS Score Color Yellow  Assess: if the MEWS score is Yellow or Red  Were vital signs accurate and taken at a resting state? Yes  Does the patient meet 2 or more of the SIRS criteria? No  MEWS guidelines implemented  Yes, yellow  Treat  MEWS Interventions Considered administering scheduled or prn medications/treatments as ordered  Take Vital Signs  Increase Vital Sign Frequency  Yellow: Q2hr x1, continue Q4hrs until patient remains green for 12hrs  Escalate  MEWS: Escalate Yellow: Discuss with charge nurse and consider notifying provider and/or RRT  Notify: Charge Nurse/RN  Name of Charge Nurse/RN Notified Jessica, RN  Provider Notification  Provider Name/Title Dr. Mayford Knife  Date Provider Notified 02/26/23  Time Provider Notified [phone]  Method of Notification  (secure chat)  Notification Reason Other (Comment) (yellow MEWS, low BP)  Provider response No new orders  Date of Provider Response 02/26/23  Time of Provider Response 0815  Assess: SIRS CRITERIA  SIRS Temperature  0  SIRS Respirations  0  SIRS Pulse 0  SIRS WBC 0  SIRS Score Sum  0   RN made Dr. Mayford Knife aware of BP 78/48 and that patient is c/o of pain in bladder and penile. MD acknowledged and placed IVF bolus order, also gave order to hold hydralazine and torsemide.

## 2023-02-26 NOTE — Progress Notes (Signed)
   02/26/23 0430  Vitals  Temp 97.7 F (36.5 C)  Temp Source Oral  BP (!) 86/51  MAP (mmHg) (!) 63  BP Location Right Arm  BP Method Automatic  Patient Position (if appropriate) Lying  Pulse Rate 93  Pulse Rate Source Monitor  Resp 19   B. Jon Billings notified of above VS and updated labs. Provider endorsed to continue to monitor BPs and to repage if MAP<60.

## 2023-02-26 NOTE — Progress Notes (Signed)
Patient c/o 10/10 bladder and penile pain. BP 78/48 therefore Unable to offer anything for pain to patient other than tylenol and to flush catheter and patient refuses both.

## 2023-02-26 NOTE — Op Note (Signed)
Preoperative diagnosis:  Gross hematuria, urinary retention  Postoperative diagnosis:  Same  Procedure: Cystoscopy, clot evacuation Suprapubic catheter placement  Surgeon: Tony Fat, MD  Anesthesia: General  Complications: None  Intraoperative findings:  #1: The patient had a false passage in the bulbous urethra.  The bladder was capacious.  But there was no bleeding within the bladder.  Large clot was evacuated from the patient's bladder. #2: A 16 French council tip catheter was placed in the suprapubic region through the patient's bladder just proximal to the bladder neck.  EBL: Minimal  Specimens: None  Indication: Tony Williams is a 77 y.o. patient with urinary retention and gross hematuria.  After reviewing the management options for treatment, he elected to proceed with the above surgical procedure(s). We have discussed the potential benefits and risks of the procedure, side effects of the proposed treatment, the likelihood of the patient achieving the goals of the procedure, and any potential problems that might occur during the procedure or recuperation. Informed consent has been obtained.  Description of procedure:  Consent was obtained the preoperative holding area.  He was brought back to the operating room placed on the table in supine position.  General esthesia was then induced endotracheal tube was inserted.  He was placed in dorsolithotomy position and prepped and draped in routine sterile fashion.  A timeout subsequently performed.  21 French 3 degree cystoscope was gently passed to the patient's urethra into the bladder.  Clot was evacuated using a [place] syringe.  Cystoscopy was performed with the above findings.  There was no active bleeding.  I then passed a 10 cm spinal needle in the suprapubic region into the bladder.  Removing the inner portion of the needle clear irrigant was effluxing.  I then passed a wire through the coaxial needle and remove the  needle over the wire.  I then dilated the suprapubic tract up to 20 [place].  I then attempted to pass a 16 [place] council tip catheter over the wire unsuccessfully.  I then exchanged the flexible wire for a stiffer wire.  I pulled it through the patient's urethra.  I then passed a 26 French trocar over the wire.  I then advanced a 16 [place] council tip catheter through the trocar and remove the trocar leaving the catheter in good position.  This was all done under visual guidance.  15 cc of water was in instilled into the balloon.  I then irrigated the suprapubic tube and it was draining clear.  I repeated cystoscopy and evacuated a few smaller clots and terminated the case.  He tolerated the case without difficulty.  Patient was transferred to the PACU in stable condition.

## 2023-02-26 NOTE — Progress Notes (Addendum)
Rounding Note    Patient Name: Tony Williams Date of Encounter: 02/26/2023  [facility] Cardiologist: Debbe Odea, MD   Subjective   Patient complains of passing blood clots since placement of Foley catheter overnight.  Having difficulty voiding.  Was in significant pain/discomfort.  Urology evaluation pending this a.m.  Inpatient Medications    Scheduled Meds:  allopurinol  300 mg Oral Daily   aspirin EC  81 mg Oral Daily   Chlorhexidine Gluconate Cloth  6 each Topical Daily   cholecalciferol  4,000 Units Oral Daily   escitalopram  20 mg Oral Daily   ezetimibe  10 mg Oral Daily   fesoterodine  4 mg Oral Daily   heparin injection (subcutaneous)  5,000 Units Subcutaneous Q8H   hydrALAZINE  10 mg Oral Q8H   isosorbide mononitrate  30 mg Oral Daily   lidocaine  2 patch Transdermal Q24H   loratadine  10 mg Oral Daily   pravastatin  40 mg Oral q1800   QUEtiapine  25 mg Oral QHS   torsemide  20 mg Oral Daily   Continuous Infusions:  sodium chloride irrigation     PRN Meds: acetaminophen, albuterol, dextromethorphan-guaiFENesin, nitroGLYCERIN, ondansetron (ZOFRAN) IV, mouth rinse, oxyCODONE, polyvinyl alcohol   Vital Signs    Vitals:   02/26/23 0557 02/26/23 0808 02/26/23 0811 02/26/23 0915  BP: 97/62 (!) 83/51 (!) 78/48 (!) 100/47  Pulse: 83  82 72  Resp:   18 16  Temp:   98.1 F (36.7 C) 97.8 F (36.6 C)  TempSrc:   Oral   SpO2:   98% 99%  Weight:      Height:        Intake/Output Summary (Last 24 hours) at 02/26/2023 0941 Last data filed at 02/26/2023 0740 Gross per 24 hour  Intake 6360 ml  Output 7850 ml  Net -1490 ml      02/25/2023    5:00 AM 02/24/2023    4:00 AM 02/23/2023    4:24 AM  Last 3 Weights  Weight (lbs) 278 lb 14.1 oz 276 lb 0.3 oz 282 lb 3 oz  Weight (kg) 126.5 kg 125.2 kg 128 kg      Telemetry    Sinus rhythm, heart rate 84- Personally Reviewed  ECG     - Personally Reviewed  Physical Exam   GEN:  Patient appears weak and frail Neck: No JVD Cardiac: RRR, no murmurs, rubs, or gallops.  Respiratory: Diminished breath sounds, no wheezing GI: Soft, nontender, non-distended  MS: No edema; No deformity. Neuro:  Nonfocal  Psych: Normal affect   Labs    High Sensitivity Troponin:   Recent Labs  Lab 02/14/23 2152 02/15/23 0205 02/15/23 0653 02/15/23 1058 02/15/23 1859  TROPONINIHS 550* 535* 541* 458* 314*     Chemistry Recent Labs  Lab 02/23/23 0329 02/23/23 1305 02/24/23 0412 02/25/23 0440 02/26/23 0419  NA 142   < > 141 136 136  K 2.9*   < > 3.2* 3.4* 3.7  CL 83*   < > 88* 90* 92*  CO2 42*   < > 38* 34* 33*  GLUCOSE 133*   < > 143* 129* 177*  BUN 85*   < > 75* 70* 61*  CREATININE 2.84*   < > 2.35* 2.20* 2.38*  CALCIUM 9.5   < > 9.5 9.7 9.8  MG 2.7*  --  3.0* 2.7*  --   GFRNONAA 22*   < > 28* 30* 28*  ANIONGAP 17*   < >  15 12 11    < > = values in this interval not displayed.    Lipids No results for input(s): "CHOL", "TRIG", "HDL", "LABVLDL", "LDLCALC", "CHOLHDL" in the last 168 hours.  Hematology Recent Labs  Lab 02/25/23 0440 02/25/23 2057 02/26/23 0419  WBC 8.3 8.3 11.9*  RBC 4.40 4.45 4.26  HGB 12.4* 12.6* 12.2*  HCT 39.3 39.3 38.3*  MCV 89.3 88.3 89.9  MCH 28.2 28.3 28.6  MCHC 31.6 32.1 31.9  RDW 15.9* 15.8* 16.0*  PLT 216 212 236   Thyroid No results for input(s): "TSH", "FREET4" in the last 168 hours.  BNPNo results for input(s): "BNP", "PROBNP" in the last 168 hours.  DDimer No results for input(s): "DDIMER" in the last 168 hours.   Radiology    No results found.  Cardiac Studies   TTE 02/15/2023 1. Left ventricular ejection fraction, by estimation, is 30 to 35%. The  left ventricle has moderately decreased function. Left ventricular  endocardial border not optimally defined to evaluate regional wall motion.  The left ventricular internal cavity  size was mildly to moderately dilated. There is mild left ventricular  hypertrophy. Left  ventricular diastolic parameters are consistent with  Grade II diastolic dysfunction (pseudonormalization). Elevated left atrial  pressure.   2. Right ventricular systolic function was not well visualized. The right  ventricular size is not well visualized.   3. Left atrial size was mildly dilated.   4. Right atrial size was moderately dilated.   5. The mitral valve was not well visualized. No evidence of mitral valve  regurgitation. No evidence of mitral stenosis.   6. The aortic valve has an indeterminant number of cusps. Aortic valve  regurgitation is trivial.   7. Pulmonic valve regurgitation not well-assessed.    Patient Profile     77 y.o. male with history of CAD/CABG x 42007, HFrEF EF 35%, hypertension, COPD, CKD presenting with shortness of breath being seen for acute CHF exacerbation.  Hospital course complicated by urinary tract obstruction, hematuria.  Assessment & Plan    HFrEF EF 35% -Recommended restarting PTA torsemide.  Family would like to hold off due to current voiding issues. -Kidney function stable -It is 2.4 L over the past 24 hours -Continue hydralazine 10 3 times daily, Imdur 30 mg daily. -Bradycardia with beta-blocker. -not a candidate for invasive workup -Start PTA torsemide after urology eval, likely tomorrow.  2.  CAD/CABG -Denies chest pain -Continue aspirin, statin  3.  Urinary tract obstruction, hematuria -Hemoglobin stable at 12 -Evaluation and management as per urology. -suprapubic cath is being planned. -patient is medically optimized from a cardiac perspective. -no additional therapy will mitigate patient's risk. Ok for procedure.  4.  Severe deconditioning, morbid obesity. -Will need short-term rehab upon discharge.      Signed, Debbe Odea, MD  02/26/2023, 9:41 AM

## 2023-02-26 NOTE — Plan of Care (Signed)
  Problem: Education: Goal: Knowledge of General Education information will improve Description Including pain rating scale, medication(s)/side effects and non-pharmacologic comfort measures Outcome: Progressing   

## 2023-02-26 NOTE — Anesthesia Postprocedure Evaluation (Signed)
Anesthesia Post Note  Patient: Tony Williams  Procedure(s) Performed: CYSTOSCOPY CLOT EVACUATION INSERTION OF SUPRAPUBIC CATHETER  Patient location during evaluation: PACU Anesthesia Type: General Level of consciousness: awake and alert Pain management: pain level controlled Vital Signs Assessment: post-procedure vital signs reviewed and stable Respiratory status: spontaneous breathing, nonlabored ventilation, respiratory function stable and patient connected to nasal cannula oxygen Cardiovascular status: blood pressure returned to baseline and stable Postop Assessment: no apparent nausea or vomiting Anesthetic complications: no   No notable events documented.   Last Vitals:  Vitals:   02/26/23 1900 02/26/23 1921  BP: (!) 111/59 (!) 124/59  Pulse: (!) 49 89  Resp: 18 16  Temp:  36.5 C  SpO2: 97% 98%    Last Pain:  Vitals:   02/26/23 2155  TempSrc:   PainSc: Asleep                 Louie Boston

## 2023-02-26 NOTE — Anesthesia Preprocedure Evaluation (Addendum)
Anesthesia Evaluation  Patient identified by MRN, date of birth, ID band Patient awake  General Assessment Comment:Presented with CHF exacerbation, now resolved. Currently on home oxygen of 3L  Per cardiology "-patient is medically optimized from a cardiac perspective. -no additional therapy will mitigate patient's risk. Ok for procedure."   Reviewed: Allergy & Precautions, NPO status , Patient's Chart, lab work & pertinent test results  History of Anesthesia Complications Negative for: history of anesthetic complications  Airway Mallampati: III  TM Distance: >3 FB Neck ROM: full    Dental  (+) Poor Dentition   Pulmonary shortness of breath, at rest and Long-Term Oxygen Therapy, sleep apnea , COPD,  COPD inhaler and oxygen dependent, former smoker   Pulmonary exam normal        Cardiovascular hypertension, (-) angina + CAD, + Past MI, + CABG, + Peripheral Vascular Disease and +CHF  Normal cardiovascular exam  EKG 12/2 Sinus rhythm with occasional Premature ventricular complexes Septal infarct , age undetermined ST & T wave abnormality, consider inferolateral ischemia  TTE 02/15/2023 1. Left ventricular ejection fraction, by estimation, is 30 to 35%. The  left ventricle has moderately decreased function. Left ventricular  endocardial border not optimally defined to evaluate regional wall motion.  The left ventricular internal cavity  size was mildly to moderately dilated. There is mild left ventricular  hypertrophy. Left ventricular diastolic parameters are consistent with  Grade II diastolic dysfunction (pseudonormalization). Elevated left atrial  pressure.   2. Right ventricular systolic function was not well visualized. The right  ventricular size is not well visualized.   3. Left atrial size was mildly dilated.   4. Right atrial size was moderately dilated.   5. The mitral valve was not well visualized. No evidence of  mitral valve  regurgitation. No evidence of mitral stenosis.   6. The aortic valve has an indeterminant number of cusps. Aortic valve  regurgitation is trivial.   7. Pulmonic valve regurgitation not well-assessed.     Neuro/Psych  PSYCHIATRIC DISORDERS Anxiety Depression     Neuromuscular disease    GI/Hepatic negative GI ROS, Neg liver ROS,,,  Endo/Other  diabetes  Class 3 obesity  Renal/GU CRFRenal disease     Musculoskeletal   Abdominal   Peds  Hematology negative hematology ROS (+)   Anesthesia Other Findings Past Medical History: 2013: Bladder cancer (HCC) No date: CAD (coronary artery disease)     Comment:  a. 2007 CABG x 4: LIMA->D1, VG->OM1, VG->D2->mLAD; b.               2018 NSTEMI/PCI: DES to VG->OM; c. 10/2020 NSTEMI/PCI: LM               90d, LAD 163m, D1 100, LCX 100ost/p, RCA sev diff dzs,               VG->D2->mLAD 99/90 before D2 (4.0x15 Onyx Frontier DES),               90 before mLAD (4.0x15 Onyx Frontier DES), LIMA->D1 ok,               VG->OM1 100; d. 10/2021 MV: no isch. mod basal to mid inf               and antsept infarct. No date: Chronic heart failure with preserved ejection fraction  (HFpEF) (HCC)     Comment:  a. 10/2020 Echo: EF 50-55%, apical HK, GrII DD, mod dil  LA; b. 11/2020 Echo: EF 60-65%, no rwma; c. 10/2021 Echo:               EF 50-55%, inf HK, GrI DD, nl RV fxn, mildly dil LA, mild              MR. No date: Chronic kidney disease No date: COPD (chronic obstructive pulmonary disease) (HCC) No date: Depression No date: Diabetes mellitus without complication (HCC) No date: Hx of CABG x 4     Comment:  LIMA-D2, SVG-OM (occluded), SeqSVG-D1-LAD. No date: Labile hypertension No date: Myocardial infarction [facility])  Past Surgical History: 12/26/1995: CHOLECYSTECTOMY, LAPAROSCOPIC 09/23/2020: COLONOSCOPY WITH PROPOFOL; N/A     Comment:  Procedure: COLONOSCOPY WITH PROPOFOL;  Surgeon: Midge Minium, MD;   Location: [facility] ENDOSCOPY;  Service:               Endoscopy;  Laterality: N/A; No date: CORONARY ARTERY BYPASS GRAFT     Comment:  LIMA-D2, SVG-OM, SeqSVG-D1-LAD 11/12/2020: CORONARY STENT INTERVENTION; N/A     Comment:  Procedure: CORONARY STENT INTERVENTION;  Surgeon: Iran Ouch, MD;  Location: [facility] INVASIVE CV LAB;  Service:               Cardiovascular;  Laterality: N/A; No date: EYE SURGERY 11/11/2020: IABP INSERTION; N/A     Comment:  Procedure: IABP Insertion;  Surgeon: Yvonne Kendall,               MD;  Location: [facility] INVASIVE CV LAB;  Service:               Cardiovascular;  Laterality: N/A; No date: IR STENT PLACEMENT ANTE CAROTID INC ANGIO No date: KNEE SURGERY 11/11/2020: RIGHT/LEFT HEART CATH AND CORONARY/GRAFT ANGIOGRAPHY; N/A     Comment:  Procedure: RIGHT/LEFT HEART CATH AND CORONARY/GRAFT               ANGIOGRAPHY;  Surgeon: Yvonne Kendall, MD;  Location:               [facility] INVASIVE CV LAB;  Service: Cardiovascular;  Severe               native CAD: 90% dLMCA-100% mLAD/100% ost LCx CTO w/               Severe diffuse RCA disease. Widely patent LIMA-D1. Patent              sequential SVG-D2-LAD with 99% mid graft & 90% LIMA-D2 @               anastomosis. previously stented SVG-OM - CTO. Mildly 1994: TRANSURETHRAL RESECTION OF PROSTATE 1995: URINARY SPHINCTER IMPLANT  BMI    Body Mass Index: 40.02 kg/m      Reproductive/Obstetrics negative OB ROS                             Anesthesia Physical Anesthesia Plan  ASA: 4  Anesthesia Plan: General ETT   Post-op Pain Management:    Induction: Intravenous  PONV Risk Score and Plan: Ondansetron, Dexamethasone, Midazolam and Treatment may vary due to age or medical condition  Airway Management Planned: Oral ETT  Additional Equipment:   Intra-op Plan:   Post-operative Plan: Extubation in OR  Informed Consent: I have reviewed the patients History and Physical,  chart, labs and discussed the procedure including the risks, benefits and alternatives for the proposed anesthesia with the patient or authorized representative who has indicated his/her understanding and acceptance.     Dental Advisory Given  Plan Discussed with: Anesthesiologist, CRNA and Surgeon  Anesthesia Plan Comments: (Patient consented for risks of anesthesia including but not limited to:  - adverse reactions to medications - damage to eyes, teeth, lips or other oral mucosa - nerve damage due to positioning  - sore throat or hoarseness - Damage to heart, brain, nerves, lungs, other parts of body or loss of life  Patient voiced understanding and assent.)        Anesthesia Quick Evaluation

## 2023-02-26 NOTE — Progress Notes (Signed)
PROGRESS NOTE   HPI was taken from Dr. Clyde Lundborg: Tony Williams is a 77 y.o. male with medical history significant of dCHF, HLD, HTN, DM, COPD on 3L oxygen, CAD (s/p of CABG and stent), gout, CKD-3b, depression with anxiety, morbid obesity, OSA, former smoker, bilateral cancer, who presents with SOB.   Patient states that he has shortness of breath in the past several weeks which has been progressively worsening.  Patient is dry cough, no fever or chills.  He had mild substernal chest pain earlier, which has resolved. Patient also has worsening bilateral lower leg edema and 15 pounds of weight gain recently.  Patient has nausea, no vomiting, diarrhea or abdominal pain.  No symptoms of UTI.  Patient states he has intermittent bilateral blurry vision for more than 3 weeks.  He reports mild numbness in both feet, and generalized weakness.  No unilateral weakness in extremities.  No facial droop or slurred speech.   Patient is normally using 3 L oxygen at baseline, but found to have acute respiratory distress, with oxygen desaturation to 82% and difficulty speaking in full sentence, currently requiring 6 L oxygen with 93% of saturation.   Data reviewed independently and ED Course: pt was found to have BNP 627.9, troponin 609, worsening renal function with creatinine 2.79, BUN 61, GFR 23 (recent baseline creatinine 2.12 on 08/31/2022).  Temperature normal, blood pressure 125/54, heart rate 68, RR 17.  Patient is admitted to PCU as inpatient. Consulted Dr. Jimmey Ralph of card who will inform provider tomorrow.      CXR: Cardiomegaly with vascular congestion and probable mild interstitial edema. Chronic pleural and parenchymal scarring in the right thorax   Tony Williams  ZOX:[id number] DOB: 1946-02-05 DOA: 02/14/2023 PCP: Erasmo Downer, MD   Assessment & Plan:   Principal Problem:   Acute on chronic diastolic CHF (congestive heart failure) (HCC) Active Problems:   Acute on chronic respiratory  failure with hypoxia (HCC)   CAD (coronary artery disease)   Myocardial injury   Chronic obstructive pulmonary disease (HCC)   Blurry vision, bilateral   Hypertension associated with diabetes (HCC)   Hyperlipidemia associated with type 2 diabetes mellitus (HCC)   Type II diabetes mellitus with renal manifestations (HCC)   Stage 3b chronic kidney disease (CKD) (HCC)   Chronic gout due to renal impairment without tophus   Depression with anxiety   OSA (obstructive sleep apnea)   Morbid obesity (HCC)   Acute on chronic congestive heart failure (HCC)   Dilated cardiomyopathy (HCC)   Acute systolic heart failure (HCC)  Assessment and Plan: Acute on chronic hypoxic respiratory failure: continue on supplemental oxygen and wean back to baseline as tolerated. Likely secondary to acute on chronic CHF exacerbation.    Acute on chronic diastolic CHF: echo on 10/29/2021 showed EF 50-55% with grade I diastolic dysfunction.  3+ leg edema, 15 pounds weight gain, elevated BNP 627, clinically consistent with CHF exacerbation. Holding diuretic still. Holding imdur, hydralazine today b/c hypotension. Cardio signed off on 02/25/23 & will need to f/u HF clinic on 03/07/23 at 2:30PM.  No GDMT w/ AKI on CKD as per cardio.    Contraction alkalosis: slightly improved from day prior. Will continue to monitor    Hx  of CAD: continue on statin, aspirin. Troponin elevation secondary to demand ischemia. Not a candidate for cardiac cath as per cardio    Urinary retention: pt pulled out foley cath on 12/10 and now w/ hematuria and clots. Foley placed again 02/23/23. D/c foley  02/24/13 & voiding trial & if the pt fails will manage the pt w/ I/O cath as prolonged foley will increase risk for erosion of the artificial urinary sphincter & engage IR for suprapubic cath placement as per uro. Another foley was placed w/ CBI overnight as per Dr. Jasmine Awe recs overnight 12/13 but pt will go for suprapubic cath placement on 12/14 as  per uro (Dr. Marlou Porch). Outpatient referral for Dr. Caryl Pina in [facility] was placed by inpatient uro.    Bacteruria: no other signs/symptoms of UTI. No need to treat for UTI    Hospital delirium: continue on seroquel    COPD: w/o exacerbation. Bronchodilators prn    Blurry vision, bilateral: etiology unclear. It has been going on for more than 3 weeks intermittently.  No unilateral weakness in extremities.  No facial droop or slurred speech.  I offered to do MRI, but the patient has claustrophobia, need open MRI.  Patient states that he wants to see eye doctor first before doing MRI. CT head neg.    HTN: holding imdur, hydralazine today b/c of hypotension. Holding amlodipine, coreg  HLD: continue on statin  DM2: HbA1c 6.3, well controlled   Hypokalemia: WNL today    AKI on CKDIIIb: Cr is labile. Avoid nephrotoxic meds   Chronic gout: continue on allopurinol. Holding colchicine    Depression: severity unknown. Continue on home dose of lexapro    OSA: CPAP qhs   Morbid obesity: BMI 40.4. Complicates overall care & prognosis    Somnolence: continue w/ CPAP at bedtime & naps. Holding home dose of lyrica, gabapentin    Left knee osteoarthritis: continue on lidocaine patch. Oxy prn       DVT prophylaxis: heparin  Code Status: full  Family Communication: discussed pt's care w/ pt's  wife at bedside and answered her questions  Disposition Plan: likely d/c to SNF   Level of care: Telemetry Cardiac Status is: Inpatient Remains inpatient appropriate because: severity of illness, will go for suprapubic cath placement     Consultants:  Urology  Cardio   Procedures:   Antimicrobials:   Subjective: Pt c/o suprapubic and penile pain   Objective: Vitals:   02/26/23 0808 02/26/23 0811 02/26/23 0915 02/26/23 1002  BP: (!) 83/51 (!) 78/48 (!) 100/47 (!) 109/56  Pulse:  82 72 67  Resp:  18 16 16   Temp:  98.1 F (36.7 C) 97.8 F (36.6 C) 98.1 F (36.7  C)  TempSrc:  Oral Oral Oral  SpO2:  98% 99% 98%  Weight:      Height:        Intake/Output Summary (Last 24 hours) at 02/26/2023 1041 Last data filed at 02/26/2023 1610 Gross per 24 hour  Intake 6360 ml  Output 8800 ml  Net -2440 ml   Filed Weights   02/23/23 0424 02/24/23 0400 02/25/23 0500  Weight: 128 kg 125.2 kg 126.5 kg    Examination:  General exam: appears uncomfortable  Respiratory system: diminished breath sounds b/l  Cardiovascular system: S1/S2+. Gastrointestinal system: abd is soft, NT, obese & hypoactive bowel sounds Central nervous system:  moves all extremities  Psychiatry: Judgement and insight appears poor. Agitated mood and affect    Data Reviewed: I have personally reviewed following labs and imaging studies  CBC: Recent Labs  Lab 02/23/23 0329 02/24/23 0412 02/25/23 0440 02/25/23 2057 02/26/23 0419  WBC 9.3 8.6 8.3 8.3 11.9*  HGB 12.9* 12.8* 12.4* 12.6* 12.2*  HCT 41.4 41.5 39.3 39.3 38.3*  MCV  90.8 91.0 89.3 88.3 89.9  PLT 199 233 216 212 236   Basic Metabolic Panel: Recent Labs  Lab 02/21/23 0422 02/22/23 0330 02/22/23 1702 02/23/23 0329 02/23/23 1305 02/24/23 0412 02/25/23 0440 02/26/23 0419  NA 142 137   < > 142 139 141 136 136  K 3.5 4.0   < > 2.9* 3.0* 3.2* 3.4* 3.7  CL 85* 79*   < > 83* 81* 88* 90* 92*  CO2 41* 38*   < > 42* 42* 38* 34* 33*  GLUCOSE 95 204*   < > 133* 165* 143* 129* 177*  BUN 76* 77*   < > 85* 80* 75* 70* 61*  CREATININE 2.29* 2.82*   < > 2.84* 2.87* 2.35* 2.20* 2.38*  CALCIUM 9.1 9.8   < > 9.5 9.5 9.5 9.7 9.8  MG 2.4 2.5*  --  2.7*  --  3.0* 2.7*  --    < > = values in this interval not displayed.   GFR: Estimated Creatinine Clearance: 35.3 mL/min (A) (by C-G formula based on SCr of 2.38 mg/dL (H)). Liver Function Tests: No results for input(s): "AST", "ALT", "ALKPHOS", "BILITOT", "PROT", "ALBUMIN" in the last 168 hours. No results for input(s): "LIPASE", "AMYLASE" in the last 168 hours. No results  for input(s): "AMMONIA" in the last 168 hours.  Coagulation Profile: No results for input(s): "INR", "PROTIME" in the last 168 hours. Cardiac Enzymes: No results for input(s): "CKTOTAL", "CKMB", "CKMBINDEX", "TROPONINI" in the last 168 hours. BNP (last 3 results) No results for input(s): "PROBNP" in the last 8760 hours. HbA1C: No results for input(s): "HGBA1C" in the last 72 hours. CBG: Recent Labs  Lab 02/19/23 1134 02/19/23 1552 02/20/23 0021 02/24/23 0814  GLUCAP 135* 153* 117* 145*   Lipid Profile: No results for input(s): "CHOL", "HDL", "LDLCALC", "TRIG", "CHOLHDL", "LDLDIRECT" in the last 72 hours. Thyroid Function Tests: No results for input(s): "TSH", "T4TOTAL", "FREET4", "T3FREE", "THYROIDAB" in the last 72 hours. Anemia Panel: No results for input(s): "VITAMINB12", "FOLATE", "FERRITIN", "TIBC", "IRON", "RETICCTPCT" in the last 72 hours. Sepsis Labs: No results for input(s): "PROCALCITON", "LATICACIDVEN" in the last 168 hours.   No results found for this or any previous visit (from the past 240 hours).        Radiology Studies: No results found.      Scheduled Meds:  allopurinol  300 mg Oral Daily   aspirin EC  81 mg Oral Daily   Chlorhexidine Gluconate Cloth  6 each Topical Daily   cholecalciferol  4,000 Units Oral Daily   escitalopram  20 mg Oral Daily   ezetimibe  10 mg Oral Daily   fesoterodine  4 mg Oral Daily   heparin injection (subcutaneous)  5,000 Units Subcutaneous Q8H   hydrALAZINE  10 mg Oral Q8H   isosorbide mononitrate  30 mg Oral Daily   lidocaine  2 patch Transdermal Q24H   loratadine  10 mg Oral Daily   pravastatin  40 mg Oral q1800   QUEtiapine  25 mg Oral QHS   torsemide  20 mg Oral Daily   Continuous Infusions:  sodium chloride irrigation       LOS: 12 days      Charise Killian, MD [facility] Pager [phone]  If 7PM-7AM, please contact night-coverage [email or web address] 02/26/2023, 10:41 AM

## 2023-02-26 NOTE — Consult Note (Signed)
ID: Urinary retention with failed voiding trial.  On CIC with some difficulty by the nursing staff.  Plan for suprapubic catheter placement in the future.  Intv:  Patient seen today for concern of gross hematuria.  The patient has had difficulty with urinary retention.  Foley catheter was removed earlier this week, and he was unable to void.  He subsequently placed on CIC, but was unsuccessful because of difficulty and gross hematuria.  Due to his gross hematuria, three-way Foley catheter was placed.  He has had severe pain since the catheter was placed and lots of clots.  It has been hand irrigated several times by the nurses, each time the patient is complaining of severe pain.  Upon arrival, I attempted to irrigate the patient's Foley catheter, and had a difficult time.  I scanned the patient's bladder and there was approximately 700 cc of urine according to the bladder scanner.  I tried to deflate the balloon and advanced the catheter, but this was also very difficult.  I remove the tip of the catheter and there was quite significant amount of blood on the tip.  I tried to reinsert it unsuccessfully.  At this point I concluded that the catheter was not in his bladder and I left it out.  At this point I reached out to cardiology, because of concern for his significant CHF and systolic heart failure.  I wanted to make sure that the patient could withstand general anesthesia in a safe manner so that we could clot a VAC the patient and get the Foley catheter in the appropriate position.  I also spoke to the family about the option of doing a suprapubic tube in the operating room.  Cardiology did in fact confirm that the patient was optimized as best as could be in his circumstance, and that although he was fairly high risk, there was no additional treatment that could be done to make things better at the moment.  He felt that it was reasonable to proceed with general anesthesia.  I conveyed all of this to  the family.  I offered bedside cystoscopy with Foley catheter placement and bladder irrigation with a consult Monday morning for suprapubic tube placement.  We discussed the alternative of going to the operating room today for clot of back and suprapubic placement.  They understand that today's procedure would require general anesthesia.  I spoke to them about the risks of this, especially in light of his severe heart failure.  Having gone through this, they have opted to proceed with suprapubic tube placement under general anesthesia.  Vitals:   02/26/23 0811 02/26/23 0915 02/26/23 1002 02/26/23 1318  BP: (!) 78/48 (!) 100/47 (!) 109/56 (!) 110/51  Pulse: 82 72 67 70  Resp: 18 16 16 18   Temp: 98.1 F (36.7 C) 97.8 F (36.6 C) 98.1 F (36.7 C) 98.4 F (36.9 C)  TempSrc: Oral Oral Oral Oral  SpO2: 98% 99% 98% 97%  Weight:      Height:        Intake/Output Summary (Last 24 hours) at 02/26/2023 1508 Last data filed at 02/26/2023 1100 Gross per 24 hour  Intake 7347.77 ml  Output 16109 ml  Net -2952.23 ml   Moderate distress/discomfort Non-labored breathing Morbidly obese Normal male penis, blood per urethra Ext symmetric with some edema  Recent Labs    02/25/23 0440 02/25/23 2057 02/26/23 0419  WBC 8.3 8.3 11.9*  HGB 12.4* 12.6* 12.2*  HCT 39.3 39.3 38.3*   Recent  Labs    02/24/23 0412 02/25/23 0440 02/26/23 0419  NA 141 136 136  K 3.2* 3.4* 3.7  CL 88* 90* 92*  CO2 38* 34* 33*  GLUCOSE 143* 129* 177*  BUN 75* 70* 61*  CREATININE 2.35* 2.20* 2.38*  CALCIUM 9.5 9.7 9.8   No results for input(s): "LABPT", "INR" in the last 72 hours. No results for input(s): "PSA" in the last 72 hours. No results for input(s): "LABURIN" in the last 72 hours. Results for orders placed or performed during the hospital encounter of 02/14/23  SARS Coronavirus 2 by RT PCR (hospital order, performed in [facility] hospital lab) *cepheid single result test* Anterior Nasal Swab     Status:  None   Collection Time: 02/14/23  7:01 PM   Specimen: Anterior Nasal Swab  Result Value Ref Range Status   SARS Coronavirus 2 by RT PCR NEGATIVE NEGATIVE Final    Comment: (NOTE) SARS-CoV-2 target nucleic acids are NOT DETECTED.  The SARS-CoV-2 RNA is generally detectable in upper and lower respiratory specimens during the acute phase of infection. The lowest concentration of SARS-CoV-2 viral copies this assay can detect is 250 copies / mL. A negative result does not preclude SARS-CoV-2 infection and should not be used as the sole basis for treatment or other patient management decisions.  A negative result may occur with improper specimen collection / handling, submission of specimen other than nasopharyngeal swab, presence of viral mutation(s) within the areas targeted by this assay, and inadequate number of viral copies (<250 copies / mL). A negative result must be combined with clinical observations, patient history, and epidemiological information.  Fact Sheet for Patients:   RoadLapTop.co.za  Fact Sheet for Healthcare Providers: http://kim-miller.com/  This test is not yet approved or  cleared by the [place] FDA and has been authorized for detection and/or diagnosis of SARS-CoV-2 by FDA under an Emergency Use Authorization (EUA).  This EUA will remain in effect (meaning this test can be used) for the duration of the COVID-19 declaration under Section 564(b)(1) of the Act, 21 U.S.C. section 360bbb-3(b)(1), unless the authorization is terminated or revoked sooner.  Performed at [facility], [address] [place], [place] [address]

## 2023-02-26 NOTE — Anesthesia Procedure Notes (Signed)
Procedure Name: Intubation Date/Time: 02/26/2023 5:10 PM  Performed by: Karoline Caldwell, CRNAPre-anesthesia Checklist: Patient identified, Patient being monitored, Timeout performed, Emergency Drugs available and Suction available Patient Re-evaluated:Patient Re-evaluated prior to induction Oxygen Delivery Method: Circle system utilized Preoxygenation: Pre-oxygenation with 100% oxygen Induction Type: IV induction Laryngoscope Size: McGrath and 4 Grade View: Grade I Tube type: Oral Tube size: 7.5 mm Number of attempts: 1 Airway Equipment and Method: Stylet Placement Confirmation: ETT inserted through vocal cords under direct vision, positive ETCO2 and breath sounds checked- equal and bilateral Secured at: 21 cm Tube secured with: Tape Dental Injury: Teeth and Oropharynx as per pre-operative assessment

## 2023-02-27 ENCOUNTER — Encounter: Payer: Self-pay | Admitting: Urology

## 2023-02-27 DIAGNOSIS — H538 Other visual disturbances: Secondary | ICD-10-CM

## 2023-02-27 DIAGNOSIS — J439 Emphysema, unspecified: Secondary | ICD-10-CM

## 2023-02-27 DIAGNOSIS — J9621 Acute and chronic respiratory failure with hypoxia: Secondary | ICD-10-CM | POA: Diagnosis not present

## 2023-02-27 DIAGNOSIS — I5021 Acute systolic (congestive) heart failure: Secondary | ICD-10-CM | POA: Diagnosis not present

## 2023-02-27 DIAGNOSIS — N179 Acute kidney failure, unspecified: Secondary | ICD-10-CM | POA: Diagnosis not present

## 2023-02-27 DIAGNOSIS — I2510 Atherosclerotic heart disease of native coronary artery without angina pectoris: Secondary | ICD-10-CM | POA: Diagnosis not present

## 2023-02-27 DIAGNOSIS — R410 Disorientation, unspecified: Secondary | ICD-10-CM | POA: Insufficient documentation

## 2023-02-27 DIAGNOSIS — E1121 Type 2 diabetes mellitus with diabetic nephropathy: Secondary | ICD-10-CM

## 2023-02-27 DIAGNOSIS — R710 Precipitous drop in hematocrit: Secondary | ICD-10-CM

## 2023-02-27 DIAGNOSIS — I5023 Acute on chronic systolic (congestive) heart failure: Secondary | ICD-10-CM | POA: Diagnosis not present

## 2023-02-27 DIAGNOSIS — N189 Chronic kidney disease, unspecified: Secondary | ICD-10-CM

## 2023-02-27 LAB — CBC
HCT: 30.4 % — ABNORMAL LOW (ref 39.0–52.0)
Hemoglobin: 9.7 g/dL — ABNORMAL LOW (ref 13.0–17.0)
MCH: 28.4 pg (ref 26.0–34.0)
MCHC: 31.9 g/dL (ref 30.0–36.0)
MCV: 88.9 fL (ref 80.0–100.0)
Platelets: 184 10*3/uL (ref 150–400)
RBC: 3.42 MIL/uL — ABNORMAL LOW (ref 4.22–5.81)
RDW: 16.4 % — ABNORMAL HIGH (ref 11.5–15.5)
WBC: 15.1 10*3/uL — ABNORMAL HIGH (ref 4.0–10.5)
nRBC: 0 % (ref 0.0–0.2)

## 2023-02-27 LAB — BASIC METABOLIC PANEL
Anion gap: 12 (ref 5–15)
BUN: 67 mg/dL — ABNORMAL HIGH (ref 8–23)
CO2: 30 mmol/L (ref 22–32)
Calcium: 8.7 mg/dL — ABNORMAL LOW (ref 8.9–10.3)
Chloride: 96 mmol/L — ABNORMAL LOW (ref 98–111)
Creatinine, Ser: 3.04 mg/dL — ABNORMAL HIGH (ref 0.61–1.24)
GFR, Estimated: 21 mL/min — ABNORMAL LOW (ref >60)
Glucose, Bld: 151 mg/dL — ABNORMAL HIGH (ref 70–99)
Potassium: 3.9 mmol/L (ref 3.5–5.1)
Sodium: 138 mmol/L (ref 135–145)

## 2023-02-27 MED ORDER — HEPARIN SODIUM (PORCINE) 5000 UNIT/ML IJ SOLN
5000.0000 [IU] | Freq: Three times a day (TID) | INTRAMUSCULAR | Status: DC
Start: 2023-02-28 — End: 2023-03-15
  Administered 2023-02-28 – 2023-03-15 (×45): 5000 [IU] via SUBCUTANEOUS
  Filled 2023-02-27 (×46): qty 1

## 2023-02-27 MED ORDER — SODIUM CHLORIDE 0.9 % IV BOLUS
500.0000 mL | Freq: Once | INTRAVENOUS | Status: AC
  Administered 2023-02-27: 500 mL via INTRAVENOUS

## 2023-02-27 MED ORDER — ALLOPURINOL 100 MG PO TABS
100.0000 mg | ORAL_TABLET | Freq: Every day | ORAL | Status: DC
  Administered 2023-02-27 – 2023-03-15 (×17): 100 mg via ORAL
  Filled 2023-02-27 (×17): qty 1

## 2023-02-27 MED ORDER — TORSEMIDE 20 MG PO TABS
20.0000 mg | ORAL_TABLET | Freq: Every day | ORAL | Status: DC
  Administered 2023-02-28 – 2023-03-03 (×4): 20 mg via ORAL
  Filled 2023-02-27 (×4): qty 1

## 2023-02-27 MED ORDER — SODIUM CHLORIDE 0.9 % IV SOLN: INTRAVENOUS | Status: DC

## 2023-02-27 NOTE — Assessment & Plan Note (Addendum)
Improved with Seroquel

## 2023-02-27 NOTE — Progress Notes (Signed)
   02/27/23 0015  Vitals  Temp 98.3 F (36.8 C)  BP (!) 87/53  MAP (mmHg) (!) 62  BP Location Right Arm  BP Method Automatic  Patient Position (if appropriate) Lying  Pulse Rate (!) 106  Pulse Rate Source Monitor  Resp 20  Level of Consciousness  Level of Consciousness Alert  Oxygen Therapy  SpO2 97 %  O2 Device CPAP  Pre-WUA / WUA Start  [place] Agitation Sedation Scale (RASS) 0  Pain Assessment  Pain Score 0  PCA/Epidural/Spinal Assessment  Respiratory Pattern Regular;Unlabored;Symmetrical  Glasgow Coma Scale  Eye Opening 4  Best Verbal Response (NON-intubated) 5  Best Motor Response 6  Glasgow Coma Scale Score 15  MEWS Score  MEWS Temp 0  MEWS Systolic 1  MEWS Pulse 1  MEWS RR 0  MEWS LOC 0  MEWS Score 2  MEWS Score Color Yellow   B. Jon Billings, provider on-call notified of pt's soft BP. Pt mentating appropriately and resting comfortably. No orders received will continue to monitor.

## 2023-02-27 NOTE — Progress Notes (Signed)
Progress Note   Patient: Tony Williams ZOX:[id number] DOB: [birth year] DOA: 02/14/2023     13 DOS: the patient was seen and examined on 02/27/2023   Brief hospital course: 77 year old man past medical history of congestive heart failure, hyperlipidemia, hypertension, type 2 diabetes mellitus, COPD on 3 L of oxygen, CAD, gout, morbid obesity, CKD, depression and anxiety, obstructive sleep apnea presented with shortness of breath.  Patient was admitted with heart failure exacerbation.  Patient pulled out Foley catheter on 12/10 and had hematuria and clots.  Foley placed on 12/11.  Patient had suprapubic catheter placed on 12/14.  12/15.  Creatinine increased to 3.05.  Held torsemide and gentle IV fluids overnight.  Hemoglobin did drift down to 9.7.  Continue to watch closely.  Assessment and Plan: Acute on chronic systolic CHF (congestive heart failure) (HCC) Last echocardiogram shows an EF of 30 to 35%.  With acute kidney injury hold torsemide today.  Gentle IV fluids.  Patient on hydralazine, Imdur.  No beta-blocker with bradycardia.  Acute kidney injury superimposed on CKD (HCC) AKI on CKD stage IIIb creatinine 2.79 on presentation and went up to 3.41 and down as low as 2.29 and up again to 3.04.  Hold torsemide today and gentle fluid.  Acute on chronic respiratory failure with hypoxia (HCC) Patient back to baseline oxygen 3 L.  Drop in hemoglobin Blood seen in the Foley catheter.  Hold heparin subcu today and reassess tomorrow.  Check another hemoglobin tomorrow.  Blurry vision, bilateral Discontinue Toviaz  Chronic obstructive pulmonary disease (HCC) Respiratory status stable  Type II diabetes mellitus with renal manifestations (HCC) Last hemoglobin A1c 6.8.  Patient on diet control.  Depression with anxiety On Lexapro  OSA (obstructive sleep apnea) CPAP at night  Obesity, Class III, BMI 40-49.9 (morbid obesity) (HCC) BMI 40.02  Acute delirium Improved with  Seroquel        Subjective: Patient feeling okay.  Poor appetite.  Had suprapubic catheter placed yesterday.  Creatinine increased today.  Admitted 12 days ago with shortness of breath  Physical Exam: Vitals:   02/27/23 0609 02/27/23 0815 02/27/23 1146 02/27/23 1619  BP: 119/67 (!) 110/47 (!) 109/53 (!) 150/58  Pulse: 88 84 82 87  Resp:  18 16 16   Temp:  98.7 F (37.1 C) 98.6 F (37 C) 98.7 F (37.1 C)  TempSrc:  Oral Oral Oral  SpO2:  97% 99% 98%  Weight:      Height:       Physical Exam HENT:     Head: Normocephalic.     Mouth/Throat:     Pharynx: No oropharyngeal exudate.  Eyes:     General: Lids are normal.     Conjunctiva/sclera: Conjunctivae normal.  Cardiovascular:     Rate and Rhythm: Normal rate and regular rhythm.     Heart sounds: Normal heart sounds, S1 normal and S2 normal.  Pulmonary:     Breath sounds: Examination of the right-lower field reveals decreased breath sounds. Examination of the left-lower field reveals decreased breath sounds. Decreased breath sounds present. No wheezing, rhonchi or rales.  Abdominal:     Palpations: Abdomen is soft.     Tenderness: There is no abdominal tenderness.  Musculoskeletal:     Right lower leg: No swelling.     Left lower leg: No swelling.  Skin:    General: Skin is warm.     Findings: No rash.  Neurological:     Mental Status: He is alert.     Data  Reviewed: Creatinine 3.04, white blood cell count 15.1, hemoglobin 9.7, platelet count 184  Family Communication: Family at bedside  Disposition: Status is: Inpatient Remains inpatient appropriate because: IV fluids today with acute kidney injury.  Also watching hemoglobin with drop  Planned Discharge Destination: Skilled nursing facility    Time spent: 28 minutes  Author: Alford Highland, MD 02/27/2023 4:41 PM  For on call review [email or web address].

## 2023-02-27 NOTE — Assessment & Plan Note (Signed)
Last hemoglobin A1c 6.8.  Patient on diet control.

## 2023-02-27 NOTE — Assessment & Plan Note (Addendum)
This morning was on 5 L for some reason.  I dialed him down to 4.  Chronically wears 3.

## 2023-02-27 NOTE — Hospital Course (Addendum)
77 year old man past medical history of congestive heart failure, hyperlipidemia, hypertension, type 2 diabetes mellitus, COPD on 3 L of oxygen, CAD, gout, morbid obesity, CKD, depression and anxiety, obstructive sleep apnea presented with shortness of breath.  Patient was admitted with heart failure exacerbation.  Patient pulled out Foley catheter on 12/10 and had hematuria and clots.  Foley placed on 12/11.  Patient had suprapubic catheter placed on 12/14.  12/15.  Creatinine increased to 3.05.  Held torsemide and gentle IV fluids overnight.  Hemoglobin did drift down to 9.7.  Continue to watch closely. 12/16.  Creatinine down to 2.27.  Discontinue fluids.  Hemoglobin drifted down to 8.8.  Recheck creatinine tomorrow.  Recheck hemoglobin tomorrow.  Hold aspirin and heparin subcutaneous injection. 12/17.  Creatinine down to 2.15 and hemoglobin 8.7.  Notified by transitional care team the rehab that they were planning on going to is out of network with his insurance.  TOC to expand bed search.  As per Dr. Mayford Knife 12/18-12/22/24: Pt's Cr started to rise again so torsemide was held and started IVFs x 1 day and trending down slightly today. Of note, insurance Berkley Harvey was denied b/c pt was not able to participate w/ therapy b/c of dizziness but CM reapplied for insurance auth and that is pending currently.   12/23.  Patient complaining of nausea consistently for the past 3 days.  He has been able to eat but still feeling very nauseous.  Patient states he is claustrophobic and cannot get into MRI.  Will get a CT scan of the head.  Patient also complained of chest pain.  First troponin 192 and second troponin up at 580.

## 2023-02-27 NOTE — Assessment & Plan Note (Addendum)
Last echocardiogram shows an EF of 30 to 35%.  Patient on hydralazine, Imdur.  No beta-blocker with bradycardia.  Torsemide on hold.

## 2023-02-27 NOTE — Progress Notes (Signed)
Rounding Note    Patient Name: Tony Williams Date of Encounter: 02/27/2023  [facility] Cardiologist: Debbe Odea, MD   Subjective   Feeling much better today, had a suprapubic catheter placed yesterday.  No acute events overnight.  Inpatient Medications    Scheduled Meds:  allopurinol  100 mg Oral Daily   aspirin EC  81 mg Oral Daily   Chlorhexidine Gluconate Cloth  6 each Topical Daily   cholecalciferol  4,000 Units Oral Daily   escitalopram  20 mg Oral Daily   ezetimibe  10 mg Oral Daily   fesoterodine  4 mg Oral Daily   heparin injection (subcutaneous)  5,000 Units Subcutaneous Q8H   hydrALAZINE  10 mg Oral Q8H   isosorbide mononitrate  30 mg Oral Daily   lidocaine  2 patch Transdermal Q24H   loratadine  10 mg Oral Daily   pravastatin  40 mg Oral q1800   QUEtiapine  25 mg Oral QHS   [START ON 02/28/2023] torsemide  20 mg Oral Daily   Continuous Infusions:  sodium chloride     sodium chloride     PRN Meds: acetaminophen, albuterol, dextromethorphan-guaiFENesin, nitroGLYCERIN, ondansetron (ZOFRAN) IV, mouth rinse, oxyCODONE, polyvinyl alcohol   Vital Signs    Vitals:   02/27/23 0015 02/27/23 0358 02/27/23 0609 02/27/23 0815  BP: (!) 87/53 (!) 143/69 119/67 (!) 110/47  Pulse: (!) 106 86 88 84  Resp: 20 20  18   Temp: 98.3 F (36.8 C) 98.1 F (36.7 C)  98.7 F (37.1 C)  TempSrc:  Oral  Oral  SpO2: 97% 98%  97%  Weight:      Height:        Intake/Output Summary (Last 24 hours) at 02/27/2023 1036 Last data filed at 02/27/2023 0358 Gross per 24 hour  Intake 1347.77 ml  Output 1825 ml  Net -477.23 ml      02/25/2023    5:00 AM 02/24/2023    4:00 AM 02/23/2023    4:24 AM  Last 3 Weights  Weight (lbs) 278 lb 14.1 oz 276 lb 0.3 oz 282 lb 3 oz  Weight (kg) 126.5 kg 125.2 kg 128 kg      Telemetry    Sinus rhythm, heart rate 84- Personally Reviewed  ECG     - Personally Reviewed  Physical Exam   GEN: Patient appears weak  and frail Neck: No JVD Cardiac: RRR, no murmurs, rubs, or gallops.  Respiratory: Diminished breath sounds, no wheezing GI: Soft, nontender, non-distended  MS: No edema; No deformity. Neuro:  Nonfocal  Psych: Normal affect   Labs    High Sensitivity Troponin:   Recent Labs  Lab 02/14/23 2152 02/15/23 0205 02/15/23 0653 02/15/23 1058 02/15/23 1859  TROPONINIHS 550* 535* 541* 458* 314*     Chemistry Recent Labs  Lab 02/23/23 0329 02/23/23 1305 02/24/23 0412 02/25/23 0440 02/26/23 0419 02/27/23 0416  NA 142   < > 141 136 136 138  K 2.9*   < > 3.2* 3.4* 3.7 3.9  CL 83*   < > 88* 90* 92* 96*  CO2 42*   < > 38* 34* 33* 30  GLUCOSE 133*   < > 143* 129* 177* 151*  BUN 85*   < > 75* 70* 61* 67*  CREATININE 2.84*   < > 2.35* 2.20* 2.38* 3.04*  CALCIUM 9.5   < > 9.5 9.7 9.8 8.7*  MG 2.7*  --  3.0* 2.7*  --   --   [facility]  22*   < > 28* 30* 28* 21*  ANIONGAP 17*   < > 15 12 11 12    < > = values in this interval not displayed.    Lipids No results for input(s): "CHOL", "TRIG", "HDL", "LABVLDL", "LDLCALC", "CHOLHDL" in the last 168 hours.  Hematology Recent Labs  Lab 02/25/23 2057 02/26/23 0419 02/27/23 0416  WBC 8.3 11.9* 15.1*  RBC 4.45 4.26 3.42*  HGB 12.6* 12.2* 9.7*  HCT 39.3 38.3* 30.4*  MCV 88.3 89.9 88.9  MCH 28.3 28.6 28.4  MCHC 32.1 31.9 31.9  RDW 15.8* 16.0* 16.4*  PLT 212 236 184   Thyroid No results for input(s): "TSH", "FREET4" in the last 168 hours.  BNPNo results for input(s): "BNP", "PROBNP" in the last 168 hours.  DDimer No results for input(s): "DDIMER" in the last 168 hours.   Radiology    DG OR UROLOGY CYSTO IMAGE [facility] ONLY) Result Date: 02/26/2023 There is no interpretation for this exam.  This order is for images obtained during a surgical procedure.  Please See "Surgeries" Tab for more information regarding the procedure.    Cardiac Studies   TTE 02/15/2023 1. Left ventricular ejection fraction, by estimation, is 30 to 35%. The  left  ventricle has moderately decreased function. Left ventricular  endocardial border not optimally defined to evaluate regional wall motion.  The left ventricular internal cavity  size was mildly to moderately dilated. There is mild left ventricular  hypertrophy. Left ventricular diastolic parameters are consistent with  Grade II diastolic dysfunction (pseudonormalization). Elevated left atrial  pressure.   2. Right ventricular systolic function was not well visualized. The right  ventricular size is not well visualized.   3. Left atrial size was mildly dilated.   4. Right atrial size was moderately dilated.   5. The mitral valve was not well visualized. No evidence of mitral valve  regurgitation. No evidence of mitral stenosis.   6. The aortic valve has an indeterminant number of cusps. Aortic valve  regurgitation is trivial.   7. Pulmonic valve regurgitation not well-assessed.    Patient Profile     77 y.o. male with history of CAD/CABG x 42007, HFrEF EF 35%, hypertension, COPD, CKD presenting with shortness of breath being seen for acute CHF exacerbation.  Hospital course complicated by urinary tract obstruction, hematuria.  Assessment & Plan    HFrEF EF 35%. -net -3.8 L over the past 24 hours -Continue hydralazine 10 3 times daily, Imdur 30 mg daily. -History of bradycardia with beta-blocker. -Continue torsemide 20 mg daily. -Monitor creatinine. -not a candidate for invasive workup  2.  CAD/CABG -Denies chest pain -Continue aspirin, statin  3.  Urinary tract obstruction, hematuria -S/p suprapubic catheter placement yesterday. -Evaluation and management as per urology.  4.  Severe deconditioning, morbid obesity. -Will need short-term rehab upon discharge.      Signed, Debbe Odea, MD  02/27/2023, 10:36 AM

## 2023-02-27 NOTE — Assessment & Plan Note (Signed)
On Lexapro 

## 2023-02-27 NOTE — Assessment & Plan Note (Signed)
Respiratory status stable. 

## 2023-02-27 NOTE — Assessment & Plan Note (Signed)
-   CPAP at night °

## 2023-02-27 NOTE — Assessment & Plan Note (Signed)
On afternoon of 7/31, patient noted to have over a liter of urinary retention on bladder scan, and out cath.  Bladder scans started and patient continues to retain.  The patient feels this is due to severe constipation. -- Continue bladder scans and in and out cath as needed if retaining over 500 and/or patient reports discomfort -- Place Foley if continuing to require caths after BM -- Suppository to facilitate BM

## 2023-02-27 NOTE — Assessment & Plan Note (Addendum)
BMI 39.54

## 2023-02-27 NOTE — Assessment & Plan Note (Addendum)
Acute blood loss anemia.  Patient did have a drop in hemoglobin during the hospital course but had hematuria especially after the suprapubic tube.  Last hemoglobin 9.0.  On oral iron.

## 2023-02-27 NOTE — Assessment & Plan Note (Addendum)
AKI on CKD stage IIIb.  Creatinine has been variable during the hospital course.  Last creatinine 2.19.

## 2023-02-27 NOTE — Progress Notes (Addendum)
Urology Inpatient Progress Report  NSTEMI (non-ST elevated myocardial infarction) (HCC) [I21.4] Acute on chronic diastolic CHF (congestive heart failure) (HCC) [I50.33] Acute decompensated heart failure (HCC) [I50.9] Acute on chronic congestive heart failure, unspecified heart failure type (HCC) [I50.9]  Procedure(s): CYSTOSCOPY CLOT EVACUATION INSERTION OF SUPRAPUBIC CATHETER  1 Day Post-Op   Intv/Subj: Tolerated suprapubic placement and clot evacuation yesterday afternoon. No acute events overnight. Patient is without complaint, feels sore all over. Urine output has been bloody, but consistent.   Principal Problem:   Acute on chronic diastolic CHF (congestive heart failure) (HCC) Active Problems:   Chronic obstructive pulmonary disease (HCC)   Morbid obesity (HCC)   Chronic gout due to renal impairment without tophus   Hypertension associated with diabetes (HCC)   Hyperlipidemia associated with type 2 diabetes mellitus (HCC)   OSA (obstructive sleep apnea)   Stage 3b chronic kidney disease (CKD) (HCC)   Type II diabetes mellitus with renal manifestations (HCC)   CAD (coronary artery disease)   Myocardial injury   Depression with anxiety   Acute on chronic respiratory failure with hypoxia (HCC)   Blurry vision, bilateral   Acute on chronic congestive heart failure (HCC)   Dilated cardiomyopathy (HCC)   Acute systolic heart failure (HCC)  Current Facility-Administered Medications  Medication Dose Route Frequency Provider Last Rate Last Admin   acetaminophen (TYLENOL) tablet 650 mg  650 mg Oral Q6H PRN Lorretta Harp, MD   650 mg at 02/27/23 0401   albuterol (PROVENTIL) (2.5 MG/3ML) 0.083% nebulizer solution 2.5 mg  2.5 mg Nebulization Q4H PRN Lorretta Harp, MD       allopurinol (ZYLOPRIM) tablet 100 mg  100 mg Oral Daily Wieting, Richard, MD       aspirin EC tablet 81 mg  81 mg Oral Daily Lorretta Harp, MD   81 mg at 02/26/23 1036   Chlorhexidine Gluconate Cloth 2 % PADS 6 each   6 each Topical Daily Darlin Priestly, MD   6 each at 02/26/23 1039   cholecalciferol (VITAMIN D3) 25 MCG (1000 UNIT) tablet 4,000 Units  4,000 Units Oral Daily Lorretta Harp, MD   4,000 Units at 02/26/23 1035   dextromethorphan-guaiFENesin (MUCINEX DM) 30-600 MG per 12 hr tablet 1 tablet  1 tablet Oral BID PRN Lorretta Harp, MD       escitalopram (LEXAPRO) tablet 20 mg  20 mg Oral Daily Lorretta Harp, MD   20 mg at 02/26/23 1035   ezetimibe (ZETIA) tablet 10 mg  10 mg Oral Daily Creig Hines, NP   10 mg at 02/26/23 1037   fesoterodine (TOVIAZ) tablet 4 mg  4 mg Oral Daily Charise Killian, MD   4 mg at 02/26/23 1037   heparin injection 5,000 Units  5,000 Units Subcutaneous Q8H Darlin Priestly, MD   5,000 Units at 02/25/23 1638   hydrALAZINE (APRESOLINE) tablet 10 mg  10 mg Oral Q8H Romie Minus, MD   10 mg at 02/25/23 2207   isosorbide mononitrate (IMDUR) 24 hr tablet 30 mg  30 mg Oral Daily Romie Minus, MD   30 mg at 02/25/23 1058   lidocaine (LIDODERM) 5 % 2 patch  2 patch Transdermal Q24H Darlin Priestly, MD   2 patch at 02/25/23 1645   loratadine (CLARITIN) tablet 10 mg  10 mg Oral Daily Lorretta Harp, MD   10 mg at 02/25/23 1058   nitroGLYCERIN (NITROSTAT) SL tablet 0.4 mg  0.4 mg Sublingual Q5 min PRN Lorretta Harp, MD  ondansetron (ZOFRAN) injection 4 mg  4 mg Intravenous Q8H PRN Lorretta Harp, MD   4 mg at 02/25/23 2124   Oral care mouth rinse  15 mL Mouth Rinse PRN Manuela Schwartz, NP       oxyCODONE (Oxy IR/ROXICODONE) immediate release tablet 5 mg  5 mg Oral Q4H PRN Charise Killian, MD   5 mg at 02/27/23 0402   polyvinyl alcohol (LIQUIFILM TEARS) 1.4 % ophthalmic solution 1 drop  1 drop Both Eyes PRN Darlin Priestly, MD       pravastatin (PRAVACHOL) tablet 40 mg  40 mg Oral q1800 Darlin Priestly, MD   40 mg at 02/25/23 1638   QUEtiapine (SEROQUEL) tablet 25 mg  25 mg Oral QHS Darlin Priestly, MD   25 mg at 02/26/23 2110   [START ON 02/28/2023] torsemide (DEMADEX) tablet 20 mg  20 mg Oral Daily Alford Highland, MD         Objective: Vital: Vitals:   02/26/23 1921 02/27/23 0015 02/27/23 0358 02/27/23 0609  BP: (!) 124/59 (!) 87/53 (!) 143/69 119/67  Pulse: 89 (!) 106 86 88  Resp: 16 20 20    Temp: 97.7 F (36.5 C) 98.3 F (36.8 C) 98.1 F (36.7 C)   TempSrc: Oral  Oral   SpO2: 98% 97% 98%   Weight:      Height:       I/Os: I/O last 3 completed shifts: In: 7707.8 [P.O.:720; Other:6000; IV Piggyback:987.8] Out: 40981 [Urine:10625]  Physical Exam:  General: Patient is in no apparent distress Lungs: Normal respiratory effort, chest expands symmetrically. Suprapubic catheter draining maroon-colored urine. Suprapubic insertion site is clean and dressed  Ext: lower extremities symmetric  Lab Results: Recent Labs    02/25/23 2057 02/26/23 0419 02/27/23 0416  WBC 8.3 11.9* 15.1*  HGB 12.6* 12.2* 9.7*  HCT 39.3 38.3* 30.4*   Recent Labs    02/25/23 0440 02/26/23 0419 02/27/23 0416  NA 136 136 138  K 3.4* 3.7 3.9  CL 90* 92* 96*  CO2 34* 33* 30  GLUCOSE 129* 177* 151*  BUN 70* 61* 67*  CREATININE 2.20* 2.38* 3.04*  CALCIUM 9.7 9.8 8.7*   No results for input(s): "LABPT", "INR" in the last 72 hours. No results for input(s): "LABURIN" in the last 72 hours. Results for orders placed or performed during the hospital encounter of 02/14/23  SARS Coronavirus 2 by RT PCR (hospital order, performed in [facility] hospital lab) *cepheid single result test* Anterior Nasal Swab     Status: None   Collection Time: 02/14/23  7:01 PM   Specimen: Anterior Nasal Swab  Result Value Ref Range Status   SARS Coronavirus 2 by RT PCR NEGATIVE NEGATIVE Final    Comment: (NOTE) SARS-CoV-2 target nucleic acids are NOT DETECTED.  The SARS-CoV-2 RNA is generally detectable in upper and lower respiratory specimens during the acute phase of infection. The lowest concentration of SARS-CoV-2 viral copies this assay can detect is 250 copies / mL. A negative result does not preclude  SARS-CoV-2 infection and should not be used as the sole basis for treatment or other patient management decisions.  A negative result may occur with improper specimen collection / handling, submission of specimen other than nasopharyngeal swab, presence of viral mutation(s) within the areas targeted by this assay, and inadequate number of viral copies (<250 copies / mL). A negative result must be combined with clinical observations, patient history, and epidemiological information.  Fact Sheet for Patients:   RoadLapTop.co.za  Fact Sheet for Healthcare Providers: http://kim-miller.com/  This test is not yet approved or  cleared by the [place] FDA and has been authorized for detection and/or diagnosis of SARS-CoV-2 by FDA under an Emergency Use Authorization (EUA).  This EUA will remain in effect (meaning this test can be used) for the duration of the COVID-19 declaration under Section 564(b)(1) of the Act, 21 U.S.C. section 360bbb-3(b)(1), unless the authorization is terminated or revoked sooner.  Performed at [facility], [address] [place], [place] [address]     Studies/Results: DG OR UROLOGY CYSTO IMAGE [facility] ONLY) Result Date: 02/26/2023 There is no interpretation for this exam.  This order is for images obtained during a surgical procedure.  Please See "Surgeries" Tab for more information regarding the procedure.    Assessment: Procedure(s): CYSTOSCOPY CLOT EVACUATION INSERTION OF SUPRAPUBIC CATHETER, 1 Day Post-Op  doing well.  Persistent gross hematuria, expected this is related to the suprapubic tube insertion.  Expect this will resolve over the course of the next day or 2.    Elevated white blood cell count likely from demargination related to the surgery and trauma from the Foley catheter.  May want to cover with antibiotics, but index of suspicion for bladder infection is low.  Plan: -Hold the  subcu heparin until tonight, which at that point would be reasonable to restart.  Would recommend mobilization if able and or SCDs for DVT prophylaxis.  I did speak with the cardiologist at the bedside, and we both agreed that holding his Plavix until his hematuria is completely resolved would be appropriate. - Keep suprapubic tube to gravity.  It is okay to irrigate with normal saline with a catheter tip syringe as needed. - The patient will need a suprapubic tube exchanged in 6 weeks.  This needs to be done in the office. - The patient would like to follow-up with me in [place], which is okay.  I can get him scheduled. I will ask the primary team to see the patient tomorrow morning again to ensure that the hematuria is improving and there is no difficulty with the SP tube.    Berniece Salines, MD Urology 02/27/2023, 8:12 AM

## 2023-02-28 DIAGNOSIS — R310 Gross hematuria: Secondary | ICD-10-CM | POA: Diagnosis not present

## 2023-02-28 DIAGNOSIS — I5023 Acute on chronic systolic (congestive) heart failure: Secondary | ICD-10-CM | POA: Diagnosis not present

## 2023-02-28 DIAGNOSIS — R710 Precipitous drop in hematocrit: Secondary | ICD-10-CM | POA: Diagnosis not present

## 2023-02-28 DIAGNOSIS — N179 Acute kidney failure, unspecified: Secondary | ICD-10-CM | POA: Diagnosis not present

## 2023-02-28 DIAGNOSIS — J9621 Acute and chronic respiratory failure with hypoxia: Secondary | ICD-10-CM | POA: Diagnosis not present

## 2023-02-28 LAB — BASIC METABOLIC PANEL
Anion gap: 6 (ref 5–15)
BUN: 56 mg/dL — ABNORMAL HIGH (ref 8–23)
CO2: 31 mmol/L (ref 22–32)
Calcium: 8.6 mg/dL — ABNORMAL LOW (ref 8.9–10.3)
Chloride: 98 mmol/L (ref 98–111)
Creatinine, Ser: 2.27 mg/dL — ABNORMAL HIGH (ref 0.61–1.24)
GFR, Estimated: 29 mL/min — ABNORMAL LOW (ref >60)
Glucose, Bld: 136 mg/dL — ABNORMAL HIGH (ref 70–99)
Potassium: 4 mmol/L (ref 3.5–5.1)
Sodium: 135 mmol/L (ref 135–145)

## 2023-02-28 LAB — GLUCOSE, CAPILLARY: Glucose-Capillary: 175 mg/dL — ABNORMAL HIGH (ref 70–99)

## 2023-02-28 LAB — CBC
HCT: 27.5 % — ABNORMAL LOW (ref 39.0–52.0)
Hemoglobin: 8.8 g/dL — ABNORMAL LOW (ref 13.0–17.0)
MCH: 29.2 pg (ref 26.0–34.0)
MCHC: 32 g/dL (ref 30.0–36.0)
MCV: 91.4 fL (ref 80.0–100.0)
Platelets: 178 10*3/uL (ref 150–400)
RBC: 3.01 MIL/uL — ABNORMAL LOW (ref 4.22–5.81)
RDW: 16.9 % — ABNORMAL HIGH (ref 11.5–15.5)
WBC: 9.9 10*3/uL (ref 4.0–10.5)
nRBC: 0 % (ref 0.0–0.2)

## 2023-02-28 MED ORDER — SODIUM CHLORIDE 0.9% FLUSH
3.0000 mL | Freq: Two times a day (BID) | INTRAVENOUS | Status: DC
Start: 2023-02-28 — End: 2023-03-15
  Administered 2023-02-28 – 2023-03-14 (×26): 3 mL via INTRAVENOUS

## 2023-02-28 NOTE — Progress Notes (Signed)
Progress Note   Patient: Tony Williams NWG:[id number] DOB: 10-21-1945 DOA: 02/14/2023     14 DOS: the patient was seen and examined on 02/28/2023   Brief hospital course: 77 year old man past medical history of congestive heart failure, hyperlipidemia, hypertension, type 2 diabetes mellitus, COPD on 3 L of oxygen, CAD, gout, morbid obesity, CKD, depression and anxiety, obstructive sleep apnea presented with shortness of breath.  Patient was admitted with heart failure exacerbation.  Patient pulled out Foley catheter on 12/10 and had hematuria and clots.  Foley placed on 12/11.  Patient had suprapubic catheter placed on 12/14.  12/15.  Creatinine increased to 3.05.  Held torsemide and gentle IV fluids overnight.  Hemoglobin did drift down to 9.7.  Continue to watch closely. 12/16.  Creatinine down to 2.27.  Discontinue fluids.  Hemoglobin drifted down to 8.8.  Recheck creatinine tomorrow.  Recheck hemoglobin tomorrow.  Hold aspirin and heparin subcutaneous injection.  Assessment and Plan: Acute on chronic systolic CHF (congestive heart failure) (HCC) Last echocardiogram shows an EF of 30 to 35%.  With improvement in creatinine will discontinue fluids today.  Patient on hydralazine, Imdur and torsemide.  No beta-blocker with bradycardia.  Acute kidney injury superimposed on CKD (HCC) AKI on CKD stage IIIb creatinine 2.79 on presentation and went up to 3.41 and down as low as 2.29 and up again to 3.04.  Today's creatinine 2.27  Acute on chronic respiratory failure with hypoxia (HCC) Patient back to baseline oxygen 3 L.  Drop in hemoglobin Blood seen in suprapubic catheter, less than yesterday.  Hold aspirin and heparin subcu today and reassess tomorrow.  Check another hemoglobin tomorrow.  Blurry vision, bilateral Discontinue Toviaz  Chronic obstructive pulmonary disease (HCC) Respiratory status stable  Type II diabetes mellitus with renal manifestations (HCC) Last hemoglobin A1c  6.8.  Patient on diet control.  Depression with anxiety On Lexapro  OSA (obstructive sleep apnea) CPAP at night  Obesity, Class III, BMI 40-49.9 (morbid obesity) (HCC) BMI 39.86  Acute delirium Improved with Seroquel        Subjective: Patient feeling better.  Urine still little bloody.  No abdominal pain.  Starting to eat a little bit better.  Initially admitted with shortness of breath.  Physical Exam: Vitals:   02/28/23 0303 02/28/23 0610 02/28/23 0826 02/28/23 1519  BP: (!) 137/58 (!) 123/52 (!) 141/58 (!) 124/52  Pulse: 87 79 82 81  Resp: 14  18 18   Temp: 98.9 F (37.2 C)  99.9 F (37.7 C) 99.4 F (37.4 C)  TempSrc: Oral     SpO2: 98%  98% 97%  Weight:      Height:       Physical Exam HENT:     Head: Normocephalic.     Mouth/Throat:     Pharynx: No oropharyngeal exudate.  Eyes:     General: Lids are normal.     Conjunctiva/sclera: Conjunctivae normal.  Cardiovascular:     Rate and Rhythm: Normal rate and regular rhythm.     Heart sounds: Normal heart sounds, S1 normal and S2 normal.  Pulmonary:     Breath sounds: Examination of the right-lower field reveals decreased breath sounds. Examination of the left-lower field reveals decreased breath sounds. Decreased breath sounds present. No wheezing, rhonchi or rales.  Abdominal:     Palpations: Abdomen is soft.     Tenderness: There is no abdominal tenderness.  Musculoskeletal:     Right lower leg: No swelling.     Left lower leg: No  swelling.  Skin:    General: Skin is warm.     Findings: No rash.  Neurological:     Mental Status: He is alert.     Data Reviewed: Creatinine down to 2.27, hemoglobin down to 8.8  Family Communication: Family at bedside  Disposition: Status is: Inpatient Remains inpatient appropriate because: Will watch hemoglobin another day.  Check creatinine another day.  Planned Discharge Destination: Rehab    Time spent: 28 minutes  Author: Alford Highland, MD 02/28/2023  4:29 PM  For on call review [email or web address].

## 2023-02-28 NOTE — Progress Notes (Addendum)
Physical Therapy Treatment Patient Details Name: Tony Williams MRN: [id number] DOB: September 14, 1945 Today's Date: 02/28/2023   History of Present Illness Tony Williams is a 77 year old male with history of CAD status post CABG and subsequent PCI's, chronic HFpEF, hypertension, hyperlipidemia, type 2 diabetes mellitus, statin intolerance, COPD, CKD stage III, and obstructive sleep apnea, who presented with SOB as well as 3-week history of progressive lower extremity edema and shortness of breath associated with a 20 pound weight gain.    PT Comments  Pt in be don entry, wife exiting. Pt ready to work on mobility as he has been in bed for a couple days now. ModA to EOD after some bed level ROM exercises. Pt is dizzy upon sitting, does not seem to change much for next 20 minutes, but it does remain a hindrance to his confidence and ability to move from one activity to the next freely. Pt maintains good trunk control during single leg exercises. Pt eventually agreeable to attempt STS from elevation, then once steady on feet, he uses RW to safely step pivot to recliner. RN made aware of request for clean linen. Sats stable throughout on 3L/min (95% each check). Pt remain incredibly weak compared to baseline, poorly tolerant to sitting upright. Pt is far from his PLOF and unable to achieve any appreciable AMB this date.    If plan is discharge home, recommend the following: Two people to help with walking and/or transfers;Two people to help with bathing/dressing/bathroom;Help with stairs or ramp for entrance;Assist for transportation;Assistance with cooking/housework   Can travel by private vehicle     No  Equipment Recommendations  Other (comment)    Recommendations for Other Services       Precautions / Restrictions Precautions Precautions: Fall Restrictions Weight Bearing Restrictions Per Provider Order: No     Mobility  Bed Mobility Overal bed mobility: Needs Assistance Bed  Mobility: Supine to Sit     Supine to sit: Mod assist     General bed mobility comments: dizzy immediately adn thereafter, but steady at EOB for >10 minutes    Transfers Overall transfer level: Needs assistance Equipment used: Rolling walker (2 wheels) Transfers: Sit to/from Stand, Bed to chair/wheelchair/BSC     Step pivot transfers: Contact guard assist      Lateral/Scoot Transfers: Contact guard assist, From elevated surface      Ambulation/Gait Ambulation/Gait assistance:  (deferred due to constant dizziness once up)                 Stairs             Wheelchair Mobility     Tilt Bed    Modified Rankin (Stroke Patients Only)       Balance                                            Cognition Arousal: Alert Behavior During Therapy: WFL for tasks assessed/performed Overall Cognitive Status: Within Functional Limits for tasks assessed                                          Exercises Other Exercises Other Exercises: seated LAQ, marching 1x10 bilat Other Exercises: supine heel slides 1x10 bilat    General Comments  Pertinent Vitals/Pain Pain Assessment Pain Assessment: No/denies pain    Home Living                          Prior Function            PT Goals (current goals can now be found in the care plan section) Acute Rehab PT Goals Patient Stated Goal: return to PLOF PT Goal Formulation: With family Time For Goal Achievement: 03/03/23 Potential to Achieve Goals: Good Progress towards PT goals: Progressing toward goals    Frequency    Min 1X/week      PT Plan      Co-evaluation              AM-PAC PT "6 Clicks" Mobility   Outcome Measure  Help needed turning from your back to your side while in a flat bed without using bedrails?: A Lot Help needed moving from lying on your back to sitting on the side of a flat bed without using bedrails?: A Lot Help  needed moving to and from a bed to a chair (including a wheelchair)?: A Lot Help needed standing up from a chair using your arms (e.g., wheelchair or bedside chair)?: A Lot Help needed to walk in hospital room?: Total Help needed climbing 3-5 steps with a railing? : Total 6 Click Score: 10    End of Session Equipment Utilized During Treatment: Oxygen Activity Tolerance: Patient limited by fatigue;Other (comment) Patient left: with call bell/phone within reach;in chair;with chair alarm set Nurse Communication: Mobility status PT Visit Diagnosis: Other abnormalities of gait and mobility (R26.89);Muscle weakness (generalized) (M62.81);Difficulty in walking, not elsewhere classified (R26.2)     Time: 2725-3664 PT Time Calculation (min) (ACUTE ONLY): 31 min  Charges:    $Therapeutic Activity: 23-37 mins PT General Charges $$ ACUTE PT VISIT: 1 Visit                    2:43 PM, 02/28/23 Rosamaria Lints, PT, DPT Physical Therapist - [facility]  [phone] (ASCOM)    Odysseus Cada C 02/28/2023, 2:39 PM

## 2023-02-28 NOTE — TOC Progression Note (Addendum)
Transition of Care [facility]) - Progression Note    Patient Details  Name: Tony Williams MRN: [id number] Date of Birth: [birth year]  Transition of Care [facility]) CM/SW Contact  Margarito Liner, LCSW Phone Number: 02/28/2023, 12:01 PM  Clinical Narrative:   Per MD, potential discharge tomorrow if stable. Bipap order discontinued this morning. MD said patient has his own machine in the room and he will likely use that at SNF. [facility] admissions coordinator is aware and confirmed they would have a bed tomorrow if medically stable and insurance authorization is approved. She is aware that suprapubic catheter was placed on Saturday. CSW will start insurance authorization.  12:21 pm: Berkley Harvey has been started.  Expected Discharge Plan and Services                                               Social Determinants of Health (SDOH) Interventions SDOH Screenings   Food Insecurity: No Food Insecurity (02/15/2023)  Housing: Low Risk  (02/24/2023)  Transportation Needs: No Transportation Needs (02/18/2023)  Utilities: Not At Risk (02/15/2023)  Alcohol Screen: Low Risk  (08/10/2022)  Depression (PHQ2-9): Low Risk  (08/10/2022)  Financial Resource Strain: Low Risk  (02/18/2023)  Physical Activity: Inactive (05/19/2021)  Social Connections: Unknown (05/19/2021)  Stress: No Stress Concern Present (05/19/2021)  Recent Concern: Stress - Stress Concern Present (02/26/2021)  Tobacco Use: Medium Risk (02/18/2023)    Readmission Risk Interventions    02/15/2023    9:37 AM 12/11/2020   11:06 AM  Readmission Risk Prevention Plan  Transportation Screening Complete Complete  PCP or Specialist Appt within 5-7 Days  Complete  PCP or Specialist Appt within 3-5 Days Complete   Medication Review (RN CM)  Complete  HRI or Home Care Consult Complete   Social Work Consult for Recovery Care Planning/Counseling Complete   Palliative Care Screening Not Applicable   Medication Review Oceanographer) Not  Complete   Med Review Comments patient will discuss medication upon discharge

## 2023-02-28 NOTE — Progress Notes (Signed)
Urology Inpatient Progress Note  Subjective: He is s/p cystoscopy with clot evacuation and suprapubic catheter placement with Dr. Marlou Porch over the weekend. Creatinine down, 2.27.  White count down, 9.9.  Hemoglobin down, 8.8. Suprapubic catheter in place draining clear urine with some blood product sediment, nonobstructing. He reports diffuse bodyaches that he attributes to laying in bed.  He denies abdominal pain.  Anti-infectives: Anti-infectives (From admission, onward)    None       Current Facility-Administered Medications  Medication Dose Route Frequency Provider Last Rate Last Admin   0.9 %  sodium chloride infusion   Intravenous Continuous Wieting, Richard, MD 40 mL/hr at 02/28/23 0900 Infusion Verify at 02/28/23 0900   acetaminophen (TYLENOL) tablet 650 mg  650 mg Oral Q6H PRN Lorretta Harp, MD   650 mg at 02/27/23 0401   albuterol (PROVENTIL) (2.5 MG/3ML) 0.083% nebulizer solution 2.5 mg  2.5 mg Nebulization Q4H PRN Lorretta Harp, MD       allopurinol (ZYLOPRIM) tablet 100 mg  100 mg Oral Daily Alford Highland, MD   100 mg at 02/28/23 7829   Chlorhexidine Gluconate Cloth 2 % PADS 6 each  6 each Topical Daily Darlin Priestly, MD   6 each at 02/27/23 [phone]   cholecalciferol (VITAMIN D3) 25 MCG (1000 UNIT) tablet 4,000 Units  4,000 Units Oral Daily Lorretta Harp, MD   4,000 Units at 02/28/23 0825   dextromethorphan-guaiFENesin (MUCINEX DM) 30-600 MG per 12 hr tablet 1 tablet  1 tablet Oral BID PRN Lorretta Harp, MD       escitalopram (LEXAPRO) tablet 20 mg  20 mg Oral Daily Lorretta Harp, MD   20 mg at 02/28/23 0825   ezetimibe (ZETIA) tablet 10 mg  10 mg Oral Daily Creig Hines, NP   10 mg at 02/28/23 0825   heparin injection 5,000 Units  5,000 Units Subcutaneous Q8H Wieting, Richard, MD       hydrALAZINE (APRESOLINE) tablet 10 mg  10 mg Oral Q8H Romie Minus, MD   10 mg at 02/25/23 2207   isosorbide mononitrate (IMDUR) 24 hr tablet 30 mg  30 mg Oral Daily Romie Minus, MD    30 mg at 02/28/23 0825   lidocaine (LIDODERM) 5 % 2 patch  2 patch Transdermal Q24H Darlin Priestly, MD   2 patch at 02/25/23 1645   loratadine (CLARITIN) tablet 10 mg  10 mg Oral Daily Lorretta Harp, MD   10 mg at 02/28/23 0825   nitroGLYCERIN (NITROSTAT) SL tablet 0.4 mg  0.4 mg Sublingual Q5 min PRN Lorretta Harp, MD       ondansetron [facility]) injection 4 mg  4 mg Intravenous Q8H PRN Lorretta Harp, MD   4 mg at 02/28/23 3086   Oral care mouth rinse  15 mL Mouth Rinse PRN Manuela Schwartz, NP       oxyCODONE (Oxy IR/ROXICODONE) immediate release tablet 5 mg  5 mg Oral Q4H PRN Charise Killian, MD   5 mg at 02/28/23 5784   polyvinyl alcohol (LIQUIFILM TEARS) 1.4 % ophthalmic solution 1 drop  1 drop Both Eyes PRN Darlin Priestly, MD       pravastatin (PRAVACHOL) tablet 40 mg  40 mg Oral q1800 Darlin Priestly, MD   40 mg at 02/27/23 1617   QUEtiapine (SEROQUEL) tablet 25 mg  25 mg Oral QHS Darlin Priestly, MD   25 mg at 02/27/23 2238   torsemide (DEMADEX) tablet 20 mg  20 mg Oral Daily Alford Highland, MD  20 mg at 02/28/23 0825     Objective: Vital signs in last 24 hours: Temp:  [97.8 F (36.6 C)-99.9 F (37.7 C)] 99.9 F (37.7 C) (12/16 0826) Pulse Rate:  [79-88] 82 (12/16 0826) Resp:  [14-20] 18 (12/16 0826) BP: (109-150)/(52-58) 141/58 (12/16 0826) SpO2:  [97 %-100 %] 98 % (12/16 0826) Weight:  [126 kg] 126 kg (12/15 1925)  Intake/Output from previous day: 12/15 0701 - 12/16 0700 In: 1605.5 [P.O.:960; I.V.:645.1; IV Piggyback:0.4] Out: 2125 [Urine:2125] Intake/Output this shift: Total I/O In: 124.6 [I.V.:124.6] Out: -   Physical Exam Vitals and nursing note reviewed.  Constitutional:      General: He is not in acute distress.    Appearance: He is not ill-appearing, toxic-appearing or diaphoretic.  HENT:     Head: Normocephalic and atraumatic.  Pulmonary:     Effort: Pulmonary effort is normal. No respiratory distress.  Skin:    General: Skin is warm and dry.  Neurological:     Mental Status: He  is alert.  Psychiatric:        Mood and Affect: Mood normal.        Behavior: Behavior normal.     Lab Results:  Recent Labs    02/27/23 0416 02/28/23 0454  WBC 15.1* 9.9  HGB 9.7* 8.8*  HCT 30.4* 27.5*  PLT 184 178   BMET Recent Labs    02/27/23 0416 02/28/23 0454  NA 138 135  K 3.9 4.0  CL 96* 98  CO2 30 31  GLUCOSE 151* 136*  BUN 67* 56*  CREATININE 3.04* 2.27*  CALCIUM 8.7* 8.6*   PT/INR No results for input(s): "LABPROT", "INR" in the last 72 hours. ABG No results for input(s): "PHART", "HCO3" in the last 72 hours.  Invalid input(s): "PCO2", "PO2"  Studies/Results: DG OR UROLOGY CYSTO IMAGE ([facility] ONLY) Result Date: 02/26/2023 There is no interpretation for this exam.  This order is for images obtained during a surgical procedure.  Please See "Surgeries" Tab for more information regarding the procedure.   Assessment & Plan: 77 year old male with urinary retention during admission with CHF, which has been complicated by recurrent gross hematuria.  Urologic history notable for AUS around 1997 and TURP around 1995.  He is now s/p cystoscopy with clot evacuation and suprapubic catheter placement with Dr. Marlou Porch over the weekend.  Gross hematuria is significantly improving.  Please manually irrigate SP tube as needed to keep it draining and perform dressing changes around his SPT as they become soiled.    Urethral Foley has been removed.  He will need to follow-up outpatient with [facility] given his AUS, scheduling in progress.  Carman Ching, PA-C 02/28/2023

## 2023-03-01 DIAGNOSIS — R710 Precipitous drop in hematocrit: Secondary | ICD-10-CM | POA: Diagnosis not present

## 2023-03-01 DIAGNOSIS — N179 Acute kidney failure, unspecified: Secondary | ICD-10-CM | POA: Diagnosis not present

## 2023-03-01 DIAGNOSIS — I5023 Acute on chronic systolic (congestive) heart failure: Secondary | ICD-10-CM | POA: Diagnosis not present

## 2023-03-01 DIAGNOSIS — J9621 Acute and chronic respiratory failure with hypoxia: Secondary | ICD-10-CM | POA: Diagnosis not present

## 2023-03-01 LAB — TYPE AND SCREEN
ABO/RH(D): A POS
Antibody Screen: NEGATIVE

## 2023-03-01 LAB — BASIC METABOLIC PANEL
Anion gap: 10 (ref 5–15)
BUN: 47 mg/dL — ABNORMAL HIGH (ref 8–23)
CO2: 29 mmol/L (ref 22–32)
Calcium: 8.8 mg/dL — ABNORMAL LOW (ref 8.9–10.3)
Chloride: 95 mmol/L — ABNORMAL LOW (ref 98–111)
Creatinine, Ser: 2.15 mg/dL — ABNORMAL HIGH (ref 0.61–1.24)
GFR, Estimated: 31 mL/min — ABNORMAL LOW (ref >60)
Glucose, Bld: 145 mg/dL — ABNORMAL HIGH (ref 70–99)
Potassium: 3.6 mmol/L (ref 3.5–5.1)
Sodium: 134 mmol/L — ABNORMAL LOW (ref 135–145)

## 2023-03-01 LAB — HEMOGLOBIN: Hemoglobin: 8.7 g/dL — ABNORMAL LOW (ref 13.0–17.0)

## 2023-03-01 MED ORDER — FERROUS SULFATE 325 (65 FE) MG PO TABS
325.0000 mg | ORAL_TABLET | Freq: Every day | ORAL | Status: DC
  Administered 2023-03-02 – 2023-03-10 (×9): 325 mg via ORAL
  Filled 2023-03-01 (×9): qty 1

## 2023-03-01 MED ORDER — ASPIRIN 81 MG PO TBEC
81.0000 mg | DELAYED_RELEASE_TABLET | Freq: Every day | ORAL | Status: DC
  Administered 2023-03-01 – 2023-03-15 (×15): 81 mg via ORAL
  Filled 2023-03-01 (×15): qty 1

## 2023-03-01 NOTE — Plan of Care (Signed)
  Problem: Coping: Goal: Ability to adjust to condition or change in health will improve Outcome: Progressing   Problem: Fluid Volume: Goal: Ability to maintain a balanced intake and output will improve Outcome: Progressing   Problem: Health Behavior/Discharge Planning: Goal: Ability to identify and utilize available resources and services will improve Outcome: Progressing Goal: Ability to manage health-related needs will improve Outcome: Progressing   Problem: Metabolic: Goal: Ability to maintain appropriate glucose levels will improve Outcome: Progressing   Problem: Nutritional: Goal: Maintenance of adequate nutrition will improve Outcome: Progressing Goal: Progress toward achieving an optimal weight will improve Outcome: Progressing   Problem: Skin Integrity: Goal: Risk for impaired skin integrity will decrease Outcome: Progressing   Problem: Tissue Perfusion: Goal: Adequacy of tissue perfusion will improve Outcome: Progressing   Problem: Education: Goal: Ability to demonstrate management of disease process will improve Outcome: Progressing Goal: Ability to verbalize understanding of medication therapies will improve Outcome: Progressing   Problem: Activity: Goal: Capacity to carry out activities will improve Outcome: Progressing   Problem: Cardiac: Goal: Ability to achieve and maintain adequate cardiopulmonary perfusion will improve Outcome: Progressing   Problem: Education: Goal: Knowledge of General Education information will improve Description: Including pain rating scale, medication(s)/side effects and non-pharmacologic comfort measures Outcome: Progressing   Problem: Health Behavior/Discharge Planning: Goal: Ability to manage health-related needs will improve Outcome: Progressing   Problem: Clinical Measurements: Goal: Ability to maintain clinical measurements within normal limits will improve Outcome: Progressing Goal: Will remain free from  infection Outcome: Progressing Goal: Diagnostic test results will improve Outcome: Progressing Goal: Respiratory complications will improve Outcome: Progressing Goal: Cardiovascular complication will be avoided Outcome: Progressing   Problem: Activity: Goal: Risk for activity intolerance will decrease Outcome: Progressing   Problem: Nutrition: Goal: Adequate nutrition will be maintained Outcome: Progressing   Problem: Coping: Goal: Level of anxiety will decrease Outcome: Progressing   Problem: Elimination: Goal: Will not experience complications related to bowel motility Outcome: Progressing Goal: Will not experience complications related to urinary retention Outcome: Progressing   Problem: Pain Management: Goal: General experience of comfort will improve Outcome: Progressing   Problem: Safety: Goal: Ability to remain free from injury will improve Outcome: Progressing   Problem: Skin Integrity: Goal: Risk for impaired skin integrity will decrease Outcome: Progressing

## 2023-03-01 NOTE — TOC Progression Note (Addendum)
Transition of Care [facility] - Progression Note    Patient Details  Name: Tony Williams MRN: [id number] Date of Birth: 1945/06/18  [facility]) CM/SW Contact  Margarito Liner, LCSW Phone Number: 03/01/2023, 8:47 AM  Clinical Narrative:   SNF insurance authorization is still pending.  12:59 pm: Still pending.  1:16 pm: [facility] is not in network with patient's insurance plan. CSW sent message to them to see if he's in network with [facility]. Discussed with daughter. She lives in [place] and wants to check [facility] area.  Expected Discharge Plan and Services                                               Social Determinants of Health (SDOH) Interventions SDOH Screenings   Food Insecurity: No Food Insecurity (02/15/2023)  Housing: Low Risk  (02/24/2023)  Transportation Needs: No Transportation Needs (02/18/2023)  Utilities: Not At Risk (02/15/2023)  Alcohol Screen: Low Risk  (08/10/2022)  Depression (PHQ2-9): Low Risk  (08/10/2022)  Financial Resource Strain: Low Risk  (02/18/2023)  Physical Activity: Inactive (05/19/2021)  Social Connections: Unknown (05/19/2021)  Stress: No Stress Concern Present (05/19/2021)  Recent Concern: Stress - Stress Concern Present (02/26/2021)  Tobacco Use: Medium Risk (02/18/2023)    Readmission Risk Interventions    02/15/2023    9:37 AM 12/11/2020   11:06 AM  Readmission Risk Prevention Plan  Transportation Screening Complete Complete  PCP or Specialist Appt within 5-7 Days  Complete  PCP or Specialist Appt within 3-5 Days Complete   Medication Review (RN CM)  Complete  HRI or Home Care Consult Complete   Social Work Consult for Recovery Care Planning/Counseling Complete   Palliative Care Screening Not Applicable   Medication Review Oceanographer) Not Complete   Med Review Comments patient will discuss medication upon discharge

## 2023-03-01 NOTE — TOC Progression Note (Addendum)
Transition of Care [facility]) - Progression Note    Patient Details  Name: Tony Williams MRN: [id number] Date of Birth: 12-Aug-1945  [facility] Care [facility]) CM/SW Contact  Liliana Cline, LCSW Phone Number: 03/01/2023, 3:09 PM  Clinical Narrative:    Confirmed with Tammy at [facility] that they are in network with patient's insurance. Attempted call to daughter [name] to present bed offers ([facility], [place], [place], [place], [address], [place], [facility].) Left a [place] requesting a return call.  3:50- Received return call from daughter [name]). Provided bed offers and Medicare Care Compare information. She is going to research [facility] and [facility]. She requested list of facilities with bed requests pending and is going to research those as well. She will call CSW tomorrow with preferences.        Expected Discharge Plan and Services                                               Social Determinants of Health (SDOH) Interventions SDOH Screenings   Food Insecurity: No Food Insecurity (02/15/2023)  Housing: Low Risk  (02/24/2023)  Transportation Needs: No Transportation Needs (02/18/2023)  Utilities: Not At Risk (02/15/2023)  Alcohol Screen: Low Risk  (08/10/2022)  Depression (PHQ2-9): Low Risk  (08/10/2022)  Financial Resource Strain: Low Risk  (02/18/2023)  Physical Activity: Inactive (05/19/2021)  Social Connections: Unknown (05/19/2021)  Stress: No Stress Concern Present (05/19/2021)  Recent Concern: Stress - Stress Concern Present (02/26/2021)  Tobacco Use: Medium Risk (02/18/2023)    Readmission Risk Interventions    02/15/2023    9:37 AM 12/11/2020   11:06 AM  Readmission Risk Prevention Plan  Transportation Screening Complete Complete  PCP or Specialist Appt within 5-7 Days  Complete  PCP or Specialist Appt within 3-5 Days Complete   Medication Review (RN CM)  Complete  HRI or Home Care Consult Complete   Social Work Consult for Recovery Care  Planning/Counseling Complete   Palliative Care Screening Not Applicable   Medication Review Oceanographer) Not Complete   Med Review Comments patient will discuss medication upon discharge

## 2023-03-01 NOTE — Progress Notes (Signed)
Progress Note   Patient: Tony Williams ZOX:[id number] DOB: Oct 19, 1945 DOA: 02/14/2023     15 DOS: the patient was seen and examined on 03/01/2023   Brief hospital course: 77 year old man past medical history of congestive heart failure, hyperlipidemia, hypertension, type 2 diabetes mellitus, COPD on 3 L of oxygen, CAD, gout, morbid obesity, CKD, depression and anxiety, obstructive sleep apnea presented with shortness of breath.  Patient was admitted with heart failure exacerbation.  Patient pulled out Foley catheter on 12/10 and had hematuria and clots.  Foley placed on 12/11.  Patient had suprapubic catheter placed on 12/14.  12/15.  Creatinine increased to 3.05.  Held torsemide and gentle IV fluids overnight.  Hemoglobin did drift down to 9.7.  Continue to watch closely. 12/16.  Creatinine down to 2.27.  Discontinue fluids.  Hemoglobin drifted down to 8.8.  Recheck creatinine tomorrow.  Recheck hemoglobin tomorrow.  Hold aspirin and heparin subcutaneous injection. 12/17.  Creatinine down to 2.15 and hemoglobin 8.7.  Notified by transitional care team the rehab that they were planning on going to is out of network with his insurance.  TOC to expand bed search.  Assessment and Plan: Acute on chronic systolic CHF (congestive heart failure) (HCC) Last echocardiogram shows an EF of 30 to 35%.  Patient on hydralazine, Imdur and torsemide.  No beta-blocker with bradycardia.  Acute kidney injury superimposed on CKD (HCC) AKI on CKD stage IIIb creatinine 2.79 on presentation and went up to 3.41 and down as low as 2.29 and up again to 3.04.  Today's creatinine 2.15.  Continue torsemide.  Received fluids with a creatinine of 3.14 the other day.  Acute on chronic respiratory failure with hypoxia (HCC) Patient back to baseline oxygen 3 L.  Drop in hemoglobin Acute blood loss anemia.  Patient did have a drop in hemoglobin but had hematuria especially after the suprapubic tube.  We held aspirin and  subcu heparin but okay to go back on it now.  Hemoglobin 8.7.  Start oral iron.  Blurry vision, bilateral Discontinue Toviaz  Chronic obstructive pulmonary disease (HCC) Respiratory status stable  Type II diabetes mellitus with renal manifestations (HCC) Last hemoglobin A1c 6.8.  Patient on diet control.  Depression with anxiety On Lexapro  OSA (obstructive sleep apnea) CPAP at night  Obesity, Class III, BMI 40-49.9 (morbid obesity) (HCC) BMI 39.86  Acute delirium Improved with Seroquel        Subjective: Patient feeling okay.  Offers no complaints.  Urine is starting to clear up.  Admitted 14 days ago with shortness of breath.  Physical Exam: Vitals:   03/01/23 0410 03/01/23 0500 03/01/23 0902 03/01/23 1231  BP: (!) 147/68  134/61 (!) 131/53  Pulse: 87  81 76  Resp: 19  15 17   Temp: 99.5 F (37.5 C)  98.2 F (36.8 C) 98.7 F (37.1 C)  TempSrc: Oral     SpO2: (!) 88%  99% 98%  Weight:  126.1 kg    Height:       Physical Exam HENT:     Head: Normocephalic.     Mouth/Throat:     Pharynx: No oropharyngeal exudate.  Eyes:     General: Lids are normal.     Conjunctiva/sclera: Conjunctivae normal.  Cardiovascular:     Rate and Rhythm: Normal rate and regular rhythm.     Heart sounds: Normal heart sounds, S1 normal and S2 normal.  Pulmonary:     Breath sounds: Examination of the right-lower field reveals decreased breath sounds.  Examination of the left-lower field reveals decreased breath sounds. Decreased breath sounds present. No wheezing, rhonchi or rales.  Abdominal:     Palpations: Abdomen is soft.     Tenderness: There is no abdominal tenderness.  Musculoskeletal:     Right lower leg: No swelling.     Left lower leg: No swelling.  Skin:    General: Skin is warm.     Findings: No rash.  Neurological:     Mental Status: He is alert.     Data Reviewed: Sodium 134, creatinine 2.15, hemoglobin 9.7  Family Communication: Spoke with family at  bedside  Disposition: Status is: Inpatient Remains inpatient appropriate because: Medically stable to go out to rehab but unfortunately the rehab that we were looking into is out of network with his insurance.  TOC to look into new beds.  Planned Discharge Destination: Rehab    Time spent: 28 what happened minutes  Author: Alford Highland, MD 03/01/2023 2:18 PM  For on call review [email or web address].

## 2023-03-01 NOTE — Plan of Care (Signed)
  Problem: Education: Goal: Ability to describe self-care measures that may prevent or decrease complications (Diabetes Survival Skills Education) will improve Outcome: Progressing Goal: Individualized Educational Video(s) Outcome: Progressing   Problem: Coping: Goal: Ability to adjust to condition or change in health will improve Outcome: Progressing   Problem: Fluid Volume: Goal: Ability to maintain a balanced intake and output will improve Outcome: Progressing   Problem: Health Behavior/Discharge Planning: Goal: Ability to identify and utilize available resources and services will improve Outcome: Progressing Goal: Ability to manage health-related needs will improve Outcome: Progressing   Problem: Metabolic: Goal: Ability to maintain appropriate glucose levels will improve Outcome: Progressing   Problem: Nutritional: Goal: Maintenance of adequate nutrition will improve Outcome: Progressing Goal: Progress toward achieving an optimal weight will improve Outcome: Progressing   Problem: Skin Integrity: Goal: Risk for impaired skin integrity will decrease Outcome: Progressing   Problem: Tissue Perfusion: Goal: Adequacy of tissue perfusion will improve Outcome: Progressing   Problem: Education: Goal: Ability to demonstrate management of disease process will improve Outcome: Progressing Goal: Ability to verbalize understanding of medication therapies will improve Outcome: Progressing Goal: Individualized Educational Video(s) Outcome: Progressing   Problem: Activity: Goal: Capacity to carry out activities will improve Outcome: Progressing   Problem: Cardiac: Goal: Ability to achieve and maintain adequate cardiopulmonary perfusion will improve Outcome: Progressing   Problem: Education: Goal: Knowledge of General Education information will improve Description: Including pain rating scale, medication(s)/side effects and non-pharmacologic comfort measures Outcome:  Progressing   Problem: Health Behavior/Discharge Planning: Goal: Ability to manage health-related needs will improve Outcome: Progressing   Problem: Clinical Measurements: Goal: Ability to maintain clinical measurements within normal limits will improve Outcome: Progressing Goal: Will remain free from infection Outcome: Progressing Goal: Diagnostic test results will improve Outcome: Progressing Goal: Respiratory complications will improve Outcome: Progressing Goal: Cardiovascular complication will be avoided Outcome: Progressing   Problem: Activity: Goal: Risk for activity intolerance will decrease Outcome: Progressing   Problem: Nutrition: Goal: Adequate nutrition will be maintained Outcome: Progressing   Problem: Coping: Goal: Level of anxiety will decrease Outcome: Progressing   Problem: Elimination: Goal: Will not experience complications related to bowel motility Outcome: Progressing Goal: Will not experience complications related to urinary retention Outcome: Progressing   Problem: Pain Management: Goal: General experience of comfort will improve Outcome: Progressing   Problem: Safety: Goal: Ability to remain free from injury will improve Outcome: Progressing   Problem: Skin Integrity: Goal: Risk for impaired skin integrity will decrease Outcome: Progressing

## 2023-03-02 DIAGNOSIS — I5033 Acute on chronic diastolic (congestive) heart failure: Secondary | ICD-10-CM | POA: Diagnosis not present

## 2023-03-02 LAB — CREATININE, SERUM
Creatinine, Ser: 2.05 mg/dL — ABNORMAL HIGH (ref 0.61–1.24)
GFR, Estimated: 33 mL/min — ABNORMAL LOW (ref >60)

## 2023-03-02 LAB — HEMOGLOBIN: Hemoglobin: 8.3 g/dL — ABNORMAL LOW (ref 13.0–17.0)

## 2023-03-02 MED ORDER — POLYETHYLENE GLYCOL 3350 17 G PO PACK
17.0000 g | PACK | Freq: Every day | ORAL | Status: DC
  Administered 2023-03-02 – 2023-03-07 (×4): 17 g via ORAL
  Filled 2023-03-02 (×9): qty 1

## 2023-03-02 MED ORDER — DOCUSATE SODIUM 100 MG PO CAPS
200.0000 mg | ORAL_CAPSULE | Freq: Two times a day (BID) | ORAL | Status: DC
  Administered 2023-03-02 – 2023-03-15 (×26): 200 mg via ORAL
  Filled 2023-03-02 (×27): qty 2

## 2023-03-02 MED ORDER — MECLIZINE HCL 25 MG PO TABS: 25.0000 mg | ORAL_TABLET | Freq: Two times a day (BID) | ORAL | Status: DC | PRN

## 2023-03-02 MED ORDER — MIRTAZAPINE 15 MG PO TABS
15.0000 mg | ORAL_TABLET | Freq: Every day | ORAL | Status: DC
  Administered 2023-03-02 – 2023-03-14 (×13): 15 mg via ORAL
  Filled 2023-03-02 (×13): qty 1

## 2023-03-02 NOTE — Care Management Important Message (Signed)
Important Message  Patient Details  Name: Tony Williams MRN: [id number] Date of Birth: 1945-11-07   Important Message Given:  Yes - Medicare IM     Bernadette Hoit 03/02/2023, 3:04 PM

## 2023-03-02 NOTE — Progress Notes (Signed)
Physical Therapy Treatment Patient Details Name: Tony Williams MRN: [id number] DOB: [birth year] Today's Date: 03/02/2023   History of Present Illness [name] is a 77 year old male with history of CAD status post CABG and subsequent PCI's, chronic HFpEF, hypertension, hyperlipidemia, type 2 diabetes mellitus, statin intolerance, COPD, CKD stage III, and obstructive sleep apnea, who presented with SOB as well as 3-week history of progressive lower extremity edema and shortness of breath associated with a 20 pound weight gain.    PT Comments  Pt remains on 3L O2, back hurting today, pt also queasy and bloated feeling, no appetite, no recent BM per DTR. Author coordianted pain meds as requested. Pt performed supervised, verbally guided ROM exercises in bed. Pt moving better from supine to EOB, then able to perform STS 3x c RW from elevated height, sustained stance ~5-8sec each. Dizziness worsens as does pain in back. Pt able to step pivot to recliner, then he is reclined where dizziness slowly improves. All needs met at EOS. Pt remains limited in activity tolerance but showing signs of improved strength, just not tolerant enough to attempt much AMB.     If plan is discharge home, recommend the following: Two people to help with walking and/or transfers;Two people to help with bathing/dressing/bathroom;Help with stairs or ramp for entrance;Assist for transportation;Assistance with cooking/housework   Can travel by private vehicle     No  Equipment Recommendations  Other (comment)    Recommendations for Other Services       Precautions / Restrictions Precautions Precautions: Fall Restrictions Weight Bearing Restrictions Per Provider Order: No     Mobility  Bed Mobility Overal bed mobility: Needs Assistance Bed Mobility: Sit to Supine     Supine to sit: Supervision, HOB elevated     General bed mobility comments: moving more robustly today compated to 2 days prior     Transfers Overall transfer level: Needs assistance Equipment used: Rolling walker (2 wheels) Transfers: Sit to/from Stand, Bed to chair/wheelchair/BSC Sit to Stand: Contact guard assist, From elevated surface   Step pivot transfers: Contact guard assist, From elevated surface            Ambulation/Gait Ambulation/Gait assistance:  (deferred (very dizziny, back hurting))                 Stairs             Wheelchair Mobility     Tilt Bed    Modified Rankin (Stroke Patients Only)       Balance                                            Cognition                                                Exercises General Exercises - Lower Extremity Short Arc Quad: AROM, Both, 10 reps, Supine Heel Slides: AROM, Both, 10 reps, Supine Other Exercises Other Exercises: sit ups from recliner to upright, 2 hand pull on bed rails x4 Other Exercises: sits as above with isometric hold and 2 to 1 hand transition x3 seconds    General Comments        Pertinent Vitals/Pain      Home Living  Prior Function            PT Goals (current goals can now be found in the care plan section) Acute Rehab PT Goals Patient Stated Goal: return to PLOF PT Goal Formulation: With family Time For Goal Achievement: 03/03/23 Potential to Achieve Goals: Good Progress towards PT goals: Progressing toward goals    Frequency    Min 1X/week      PT Plan      Co-evaluation              AM-PAC PT "6 Clicks" Mobility   Outcome Measure  Help needed turning from your back to your side while in a flat bed without using bedrails?: A Little Help needed moving from lying on your back to sitting on the side of a flat bed without using bedrails?: A Little Help needed moving to and from a bed to a chair (including a wheelchair)?: A Lot Help needed standing up from a chair using your arms (e.g.,  wheelchair or bedside chair)?: A Lot Help needed to walk in hospital room?: A Lot Help needed climbing 3-5 steps with a railing? : A Lot 6 Click Score: 14    End of Session Equipment Utilized During Treatment: Oxygen Activity Tolerance: Patient limited by fatigue;Other (comment) (dizzy, back hurting worse) Patient left: with call bell/phone within reach;in chair;with chair alarm set Nurse Communication: Mobility status PT Visit Diagnosis: Other abnormalities of gait and mobility (R26.89);Muscle weakness (generalized) (M62.81);Difficulty in walking, not elsewhere classified (R26.2)     Time: 3295-1884 PT Time Calculation (min) (ACUTE ONLY): 46 min  Charges:    $Therapeutic Exercise: 23-37 mins $Therapeutic Activity: 8-22 mins PT General Charges $$ ACUTE PT VISIT: 1 Visit                    4:07 PM, 03/02/23 Rosamaria Lints, PT, DPT Physical Therapist - [facility]  [phone] (ASCOM)    Larrissa Stivers C 03/02/2023, 4:04 PM

## 2023-03-02 NOTE — Progress Notes (Signed)
PROGRESS NOTE    Tony Williams  WGN:[id number] DOB: 17-Apr-1945 DOA: 02/14/2023 PCP: Erasmo Downer, MD   Assessment & Plan:   Active Problems:   Acute on chronic systolic CHF (congestive heart failure) (HCC)   Acute kidney injury superimposed on CKD (HCC)   Acute on chronic respiratory failure with hypoxia (HCC)   Drop in hemoglobin   Chronic obstructive pulmonary disease (HCC)   Blurry vision, bilateral   Hypertension associated with diabetes (HCC)   Hyperlipidemia associated with type 2 diabetes mellitus (HCC)   Type II diabetes mellitus with renal manifestations (HCC)   CAD (coronary artery disease)   Myocardial injury   Chronic gout due to renal impairment without tophus   Depression with anxiety   OSA (obstructive sleep apnea)   Obesity, Class III, BMI 40-49.9 (morbid obesity) (HCC)   Dilated cardiomyopathy (HCC)   Acute delirium  Assessment and Plan:  Acute on chronic systolic CHF: last echo shows an EF of 30 to 35%. Continue  hydralazine, Imdur and torsemide. No BB secondary to bradycardia. Cardio following and recs apprec    AKI on CKDIIIb: Cr is trending down from day prior. Continue torsemide. Cardio following and recs apprec    Acute on chronic hypoxic respiratory failure: continue on supplemental oxygen. Back at pt's baseline oxygen, 3L [place]    Acute blood loss anemia: no need for transfusion currently. Will transfuse if Hb < 7.0. Likely secondary to hematuria.   Urinary retention: s/p suprapubic cath place by uro 12/14. pt pulled out foley cath on 12/10 and w/ hematuria and clots. Foley placed again 02/23/23. D/c foley 02/24/13 & voiding trial & if the pt fails will manage the pt w/ I/O cath as prolonged foley will increase risk for erosion of the artificial urinary sphincter & engage IR for suprapubic cath placement as per uro. Another foley was placed w/ CBI overnight as per Dr. Jasmine Awe recs overnight 12/13. Outpatient referral for Dr. Caryl Pina in  [facility] was placed by inpatient uro    Blurry vision: bilateral. D/c toviaz   COPD: w/o acute exacerbation. Bronchodilators prn    DM2: HbA1c 6.8, well controlled. Diet controled    Depression: severity unknown. Continue on home dose of lexapro    OSA: CPAP qhs   Morbid obesity: BMI 39.8. Complicates overall care & prognosis    Acute delirium: continue w/ seroquel      DVT prophylaxis: heparin  Code Status: full  Family Communication: discussed pt's care w/ pt's wife and answered her questions  Disposition Plan: unclear as pt's insurance denied SNF placement   Level of care: Telemetry Cardiac Consultants:  Uro  Cardio   Procedures:   Antimicrobials:    Subjective: Pt c/o back pain   Objective: Vitals:   03/02/23 0400 03/02/23 0600 03/02/23 0614 03/02/23 0804  BP:   (!) 148/72 (!) 117/55  Pulse:    (!) 50  Resp: 18 18 18    Temp:    98.4 F (36.9 C)  TempSrc:      SpO2:    97%  Weight:      Height:        Intake/Output Summary (Last 24 hours) at 03/02/2023 0846 Last data filed at 03/02/2023 0600 Gross per 24 hour  Intake 960 ml  Output 1000 ml  Net -40 ml   Filed Weights   02/27/23 1925 03/01/23 0500 03/02/23 0314  Weight: 126 kg 126.1 kg 125.1 kg    Examination:  General exam: Appears calm but uncomfortable  Respiratory system: Clear to auscultation. Respiratory effort normal. Cardiovascular system: S1 & S2+. No  rubs, gallops or clicks Gastrointestinal system: Abdomen is obese, soft and nontender. Hypoactive bowel sounds heard. Central nervous system: Alert and oriented. Moves all extremities  Psychiatry: Judgement and insight appears at baseline. Flat mood and affect     Data Reviewed: I have personally reviewed following labs and imaging studies  CBC: Recent Labs  Lab 02/25/23 0440 02/25/23 2057 02/26/23 0419 02/27/23 0416 02/28/23 0454 03/01/23 0428 03/02/23 0426  WBC 8.3 8.3 11.9* 15.1* 9.9  --   --   HGB 12.4* 12.6*  12.2* 9.7* 8.8* 8.7* 8.3*  HCT 39.3 39.3 38.3* 30.4* 27.5*  --   --   MCV 89.3 88.3 89.9 88.9 91.4  --   --   PLT 216 212 236 184 178  --   --    Basic Metabolic Panel: Recent Labs  Lab 02/24/23 0412 02/25/23 0440 02/26/23 0419 02/27/23 0416 02/28/23 0454 03/01/23 0428 03/02/23 0426  NA 141 136 136 138 135 134*  --   K 3.2* 3.4* 3.7 3.9 4.0 3.6  --   CL 88* 90* 92* 96* 98 95*  --   CO2 38* 34* 33* 30 31 29   --   GLUCOSE 143* 129* 177* 151* 136* 145*  --   BUN 75* 70* 61* 67* 56* 47*  --   CREATININE 2.35* 2.20* 2.38* 3.04* 2.27* 2.15* 2.05*  CALCIUM 9.5 9.7 9.8 8.7* 8.6* 8.8*  --   MG 3.0* 2.7*  --   --   --   --   --    GFR: Estimated Creatinine Clearance: 40.7 mL/min (A) (by C-G formula based on SCr of 2.05 mg/dL (H)). Liver Function Tests: No results for input(s): "AST", "ALT", "ALKPHOS", "BILITOT", "PROT", "ALBUMIN" in the last 168 hours. No results for input(s): "LIPASE", "AMYLASE" in the last 168 hours. No results for input(s): "AMMONIA" in the last 168 hours. Coagulation Profile: No results for input(s): "INR", "PROTIME" in the last 168 hours. Cardiac Enzymes: No results for input(s): "CKTOTAL", "CKMB", "CKMBINDEX", "TROPONINI" in the last 168 hours. BNP (last 3 results) No results for input(s): "PROBNP" in the last 8760 hours. HbA1C: No results for input(s): "HGBA1C" in the last 72 hours. CBG: Recent Labs  Lab 02/24/23 0814 02/26/23 1628 02/26/23 1823  GLUCAP 145* 175* 175*   Lipid Profile: No results for input(s): "CHOL", "HDL", "LDLCALC", "TRIG", "CHOLHDL", "LDLDIRECT" in the last 72 hours. Thyroid Function Tests: No results for input(s): "TSH", "T4TOTAL", "FREET4", "T3FREE", "THYROIDAB" in the last 72 hours. Anemia Panel: No results for input(s): "VITAMINB12", "FOLATE", "FERRITIN", "TIBC", "IRON", "RETICCTPCT" in the last 72 hours. Sepsis Labs: No results for input(s): "PROCALCITON", "LATICACIDVEN" in the last 168 hours.  No results found for this or  any previous visit (from the past 240 hours).       Radiology Studies: No results found.      Scheduled Meds:  allopurinol  100 mg Oral Daily   aspirin EC  81 mg Oral Daily   cholecalciferol  4,000 Units Oral Daily   escitalopram  20 mg Oral Daily   ezetimibe  10 mg Oral Daily   ferrous sulfate  325 mg Oral Q breakfast   heparin injection (subcutaneous)  5,000 Units Subcutaneous Q8H   hydrALAZINE  10 mg Oral Q8H   isosorbide mononitrate  30 mg Oral Daily   lidocaine  2 patch Transdermal Q24H   loratadine  10 mg Oral Daily   pravastatin  40 mg Oral q1800   QUEtiapine  25 mg Oral QHS   sodium chloride flush  3 mL Intravenous Q12H   torsemide  20 mg Oral Daily   Continuous Infusions:   LOS: 16 days       Charise Killian, MD [facility] Pager [phone]  If 7PM-7AM, please contact night-coverage [email or web address] 03/02/2023, 8:46 AM

## 2023-03-02 NOTE — Progress Notes (Signed)
Occupational Therapy Treatment Patient Details Name: Tony Williams MRN: [id number] DOB: Nov 08, 1945 Today's Date: 03/02/2023   History of present illness [name] is a 77 year old male with history of CAD status post CABG and subsequent PCI's, chronic HFpEF, hypertension, hyperlipidemia, type 2 diabetes mellitus, statin intolerance, COPD, CKD stage III, and obstructive sleep apnea, who presented with SOB as well as 3-week history of progressive lower extremity edema and shortness of breath associated with a 20 pound weight gain.   OT comments  Pt received sitting EOB after transferring from recliner with NT. Pt endorsing dizziness, requesting to return back to bed (tolerated sitting in recliner only for ~1 hr after working with PT). With encouragement, pt agreeable to working on seated ADLs (see flowsheet for further details). Educated on importance of OOB mobility, improving tolerance to sitting upright, and progression of functional tasks to improve independence. Discharge recommendation remains appropriate. Patient will benefit from continued inpatient follow up therapy, <3 hours/day. OT will continue to follow POC for functional gains.       If plan is discharge home, recommend the following:  A lot of help with walking and/or transfers;A lot of help with bathing/dressing/bathroom;Help with stairs or ramp for entrance;Direct supervision/assist for medications management;Assist for transportation;Assistance with cooking/housework   Equipment Recommendations  BSC/3in1       Precautions / Restrictions Precautions Precautions: Fall Restrictions Weight Bearing Restrictions Per Provider Order: No       Mobility Bed Mobility Overal bed mobility: Needs Assistance Bed Mobility: Sit to Supine       Sit to supine: Contact guard assist, HOB elevated, Used rails   General bed mobility comments: increased time, cues, reporting dizziness resolved after returning to supine     Transfers                   General transfer comment: NT, pt defers standing/transfers as he was just up in recliner after PT session, requesting to return to bed     Balance Overall balance assessment: Needs assistance Sitting-balance support: Single extremity supported Sitting balance-Leahy Scale: Good                                     ADL either performed or assessed with clinical judgement   ADL Overall ADL's : Needs assistance/impaired     Grooming: Wash/dry hands;Wash/dry face;Sitting Grooming Details (indicate cue type and reason): with encourgement, pt remains seated EOB to wash face. pt with poor activity tolerance and fatiguing quickly                               General ADL Comments: Pt sat EOB for ~10 mins during session to work on seated grooming tasks and improving activity tolerance while OT provides education      Cognition Arousal: Alert Behavior During Therapy: WFL for tasks assessed/performed Overall Cognitive Status: Within Functional Limits for tasks assessed                                                     Pertinent Vitals/ Pain       Pain Assessment Faces Pain Scale: Hurts a little bit Pain Location: stomach Pain Descriptors / Indicators: Discomfort Pain Intervention(s):  Premedicated before session, Monitored during session, Limited activity within patient's tolerance, Repositioned         Frequency  Min 1X/week        Progress Toward Goals  OT Goals(current goals can now be found in the care plan section)  Progress towards OT goals: Progressing toward goals  Acute Rehab OT Goals OT Goal Formulation: With patient/family Time For Goal Achievement: 03/08/23 Potential to Achieve Goals: Good ADL Goals Pt Will Perform Grooming: sitting;with supervision;with set-up Pt Will Perform Lower Body Dressing: sit to/from stand;with supervision;with set-up;with adaptive equipment Pt  Will Transfer to Toilet: bedside commode;with supervision;with set-up;ambulating Pt Will Perform Toileting - Clothing Manipulation and hygiene: with adaptive equipment;with supervision;with set-up;sit to/from stand  Plan         AM-PAC OT "6 Clicks" Daily Activity     Outcome Measure   Help from another person eating meals?: A Little Help from another person taking care of personal grooming?: A Little Help from another person toileting, which includes using toliet, bedpan, or urinal?: A Lot Help from another person bathing (including washing, rinsing, drying)?: A Lot Help from another person to put on and taking off regular upper body clothing?: A Lot Help from another person to put on and taking off regular lower body clothing?: A Lot 6 Click Score: 14    End of Session Equipment Utilized During Treatment: Oxygen  OT Visit Diagnosis: Other abnormalities of gait and mobility (R26.89);Muscle weakness (generalized) (M62.81)   Activity Tolerance Patient limited by fatigue   Patient Left in bed;with call bell/phone within reach;with bed alarm set;with family/visitor present   Nurse Communication Mobility status        Time: 1324-4010 OT Time Calculation (min): 14 min  Charges: OT General Charges $OT Visit: 1 Visit OT Treatments $Self Care/Home Management : 8-22 mins  Zyen Triggs L. Tylicia Sherman, OTR/L  03/02/23, 3:41 PM

## 2023-03-02 NOTE — Plan of Care (Signed)
  Problem: Coping: Goal: Ability to adjust to condition or change in health will improve Outcome: Progressing   Problem: Fluid Volume: Goal: Ability to maintain a balanced intake and output will improve Outcome: Progressing   Problem: Health Behavior/Discharge Planning: Goal: Ability to identify and utilize available resources and services will improve Outcome: Progressing Goal: Ability to manage health-related needs will improve Outcome: Progressing   Problem: Metabolic: Goal: Ability to maintain appropriate glucose levels will improve Outcome: Progressing   Problem: Nutritional: Goal: Maintenance of adequate nutrition will improve Outcome: Progressing Goal: Progress toward achieving an optimal weight will improve Outcome: Progressing   Problem: Skin Integrity: Goal: Risk for impaired skin integrity will decrease Outcome: Progressing   Problem: Tissue Perfusion: Goal: Adequacy of tissue perfusion will improve Outcome: Progressing   Problem: Education: Goal: Ability to demonstrate management of disease process will improve Outcome: Progressing Goal: Ability to verbalize understanding of medication therapies will improve Outcome: Progressing   Problem: Activity: Goal: Capacity to carry out activities will improve Outcome: Progressing   Problem: Cardiac: Goal: Ability to achieve and maintain adequate cardiopulmonary perfusion will improve Outcome: Progressing   Problem: Education: Goal: Knowledge of General Education information will improve Description: Including pain rating scale, medication(s)/side effects and non-pharmacologic comfort measures Outcome: Progressing   Problem: Health Behavior/Discharge Planning: Goal: Ability to manage health-related needs will improve Outcome: Progressing   Problem: Clinical Measurements: Goal: Ability to maintain clinical measurements within normal limits will improve Outcome: Progressing Goal: Will remain free from  infection Outcome: Progressing Goal: Diagnostic test results will improve Outcome: Progressing Goal: Respiratory complications will improve Outcome: Progressing Goal: Cardiovascular complication will be avoided Outcome: Progressing   Problem: Activity: Goal: Risk for activity intolerance will decrease Outcome: Progressing   Problem: Nutrition: Goal: Adequate nutrition will be maintained Outcome: Progressing   Problem: Coping: Goal: Level of anxiety will decrease Outcome: Progressing   Problem: Elimination: Goal: Will not experience complications related to bowel motility Outcome: Progressing Goal: Will not experience complications related to urinary retention Outcome: Progressing   Problem: Pain Management: Goal: General experience of comfort will improve Outcome: Progressing   Problem: Safety: Goal: Ability to remain free from injury will improve Outcome: Progressing   Problem: Skin Integrity: Goal: Risk for impaired skin integrity will decrease Outcome: Progressing

## 2023-03-02 NOTE — TOC Progression Note (Addendum)
Transition of Care [facility]) - Progression Note    Patient Details  Name: Tony Williams MRN: [id number] Date of Birth: 1945/09/17  [facility] CM/SW Contact  Margarito Liner, LCSW Phone Number: 03/02/2023, 9:36 AM  Clinical Narrative:   CSW received voicemail from daughter asking to look at [facility], [facility], and [facility]. [facility] will review. CSW contact [facility] liaison asking that she have their SNF admissions coordinator review the referral. CSW left message for [facility] coordinator. They had previously declined referral but CSW asked if they would review again.  10:02 am: [facility] does not have any male rehab beds at this time.  10:55 am: [facility] is still unable to offer a bed. No decision from [facility] yet.  11:13 am: [facility] is unable to offer a bed. Daughter is aware and reviewing the other options.  11:34 am: Daughter will discuss [facility] vs [facility] with her mother and follow up with decision.  12:59 pm: Family has chosen [facility] in [place]. Admissions coordinator is aware. CSW sent message to [facility] asking them to change the facility to [facility].  1:21 pm: Insurance is offering a peer-to-peer review. CSW sent details to MD. Deadline is tomorrow at 9:00 am.  4:37 pm: Per MD, insurance is going to deny authorization since is unable to participate with therapy due to dizziness but said we can resubmit once he is able to participate. Daughter is aware. She feels like his dizziness/nausea is due to lack of appetite and asked about an appetite stimulant to see if that helps. CSW sent secure chat to MD.  Expected Discharge Plan and Services                                               Social Determinants of Health (SDOH) Interventions SDOH Screenings   Food Insecurity: No Food Insecurity (02/15/2023)  Housing: Low Risk  (02/24/2023)  Transportation Needs: No Transportation Needs  (02/18/2023)  Utilities: Not At Risk (02/15/2023)  Alcohol Screen: Low Risk  (08/10/2022)  Depression (PHQ2-9): Low Risk  (08/10/2022)  Financial Resource Strain: Low Risk  (02/18/2023)  Physical Activity: Inactive (05/19/2021)  Social Connections: Unknown (05/19/2021)  Stress: No Stress Concern Present (05/19/2021)  Recent Concern: Stress - Stress Concern Present (02/26/2021)  Tobacco Use: Medium Risk (02/18/2023)    Readmission Risk Interventions    02/15/2023    9:37 AM 12/11/2020   11:06 AM  Readmission Risk Prevention Plan  Transportation Screening Complete Complete  PCP or Specialist Appt within 5-7 Days  Complete  PCP or Specialist Appt within 3-5 Days Complete   Medication Review (RN CM)  Complete  HRI or Home Care Consult Complete   Social Work Consult for Recovery Care Planning/Counseling Complete   Palliative Care Screening Not Applicable   Medication Review Oceanographer) Not Complete   Med Review Comments patient will discuss medication upon discharge

## 2023-03-03 DIAGNOSIS — I5033 Acute on chronic diastolic (congestive) heart failure: Secondary | ICD-10-CM | POA: Diagnosis not present

## 2023-03-03 LAB — CBC
HCT: 28.1 % — ABNORMAL LOW (ref 39.0–52.0)
Hemoglobin: 8.8 g/dL — ABNORMAL LOW (ref 13.0–17.0)
MCH: 28.8 pg (ref 26.0–34.0)
MCHC: 31.3 g/dL (ref 30.0–36.0)
MCV: 91.8 fL (ref 80.0–100.0)
Platelets: 302 10*3/uL (ref 150–400)
RBC: 3.06 MIL/uL — ABNORMAL LOW (ref 4.22–5.81)
RDW: 16.7 % — ABNORMAL HIGH (ref 11.5–15.5)
WBC: 8.3 10*3/uL (ref 4.0–10.5)
nRBC: 0.2 % (ref 0.0–0.2)

## 2023-03-03 LAB — BASIC METABOLIC PANEL
Anion gap: 14 (ref 5–15)
BUN: 43 mg/dL — ABNORMAL HIGH (ref 8–23)
CO2: 28 mmol/L (ref 22–32)
Calcium: 9.1 mg/dL (ref 8.9–10.3)
Chloride: 93 mmol/L — ABNORMAL LOW (ref 98–111)
Creatinine, Ser: 2.23 mg/dL — ABNORMAL HIGH (ref 0.61–1.24)
GFR, Estimated: 30 mL/min — ABNORMAL LOW (ref >60)
Glucose, Bld: 185 mg/dL — ABNORMAL HIGH (ref 70–99)
Potassium: 3.3 mmol/L — ABNORMAL LOW (ref 3.5–5.1)
Sodium: 135 mmol/L (ref 135–145)

## 2023-03-03 MED ORDER — LACTULOSE 10 GM/15ML PO SOLN
20.0000 g | Freq: Two times a day (BID) | ORAL | Status: DC
Start: 2023-03-03 — End: 2023-03-04
  Administered 2023-03-03 (×2): 20 g via ORAL
  Filled 2023-03-03 (×3): qty 30

## 2023-03-03 NOTE — Plan of Care (Signed)
  Problem: Coping: Goal: Ability to adjust to condition or change in health will improve Outcome: Progressing   Problem: Fluid Volume: Goal: Ability to maintain a balanced intake and output will improve Outcome: Progressing

## 2023-03-03 NOTE — Progress Notes (Signed)
   03/03/23 2210  BiPAP/CPAP/SIPAP  $ Non-Invasive Home Ventilator  Subsequent  BiPAP/CPAP/SIPAP Pt Type Adult  BiPAP/CPAP/SIPAP Resmed  Mask Type Full face mask  Mask Size Medium  Set Rate 12 breaths/min  Respiratory Rate 17 breaths/min  IPAP 16 cmH20  EPAP 6 cmH2O  Pressure Support 10 cmH20  Flow Rate 3 lpm  Patient Home Equipment Yes  Auto Titrate No     03/03/23 2210  BiPAP/CPAP/SIPAP  $ Non-Invasive Home Ventilator  Subsequent  BiPAP/CPAP/SIPAP Pt Type Adult  BiPAP/CPAP/SIPAP Resmed  Mask Type Full face mask  Mask Size Medium  Set Rate 12 breaths/min  Respiratory Rate 17 breaths/min  IPAP 16 cmH20  EPAP 6 cmH2O  Pressure Support 10 cmH20  Flow Rate 3 lpm  Patient Home Equipment Yes  Auto Titrate No

## 2023-03-03 NOTE — Progress Notes (Signed)
Physical Therapy Treatment Patient Details Name: Tony Williams MRN: [id number] DOB: 30-Nov-1945 Today's Date: 03/03/2023   History of Present Illness Mr. Bryndan Lindert is a 77 year old male with history of CAD status post CABG and subsequent PCI's, chronic HFpEF, hypertension, hyperlipidemia, type 2 diabetes mellitus, statin intolerance, COPD, CKD stage III, and obstructive sleep apnea, who presented with SOB as well as 3-week history of progressive lower extremity edema and shortness of breath associated with a 20 pound weight gain.    PT Comments  Pt presents alert and orient, supine in bed with wife present and agreeable to PT.  Pt exhibits an improvement in mobility with ability to perform transfers and 10 steps ambulation with mod I. He continues to be limited by dizziness and his vitals remained stable throughout. Pt benefit from increased time between transfers to allow for dizziness to subside as well as active encouragement to continue to attempt mobilization throughout session. He will continue to benefit from skilled PT to improve his mobility and to decrease caregiver burden.    If plan is discharge home, recommend the following: Assist for transportation;Assistance with cooking/housework;Assistance with feeding;Direct supervision/assist for medications management;Help with stairs or ramp for entrance   Can travel by private vehicle        Equipment Recommendations  Rolling walker (2 wheels)    Recommendations for Other Services       Precautions / Restrictions Precautions Precautions: Fall;None Restrictions Weight Bearing Restrictions Per Provider Order: No     Mobility  Bed Mobility Overal bed mobility: Modified Independent                  Transfers Overall transfer level: Modified independent Equipment used: Rolling walker (2 wheels)   Sit to Stand: Modified independent (Device/Increase time)                Ambulation/Gait Ambulation/Gait  assistance: Modified independent (Device/Increase time) Gait Distance (Feet): 10 Feet Assistive device: Rolling walker (2 wheels) Gait Pattern/deviations: Decreased step length - right, Decreased step length - left Gait velocity: Slow         Stairs             Wheelchair Mobility     Tilt Bed    Modified Rankin (Stroke Patients Only)       Balance Overall balance assessment: Modified Independent                                          Cognition Arousal: Alert Behavior During Therapy: WFL for tasks assessed/performed Overall Cognitive Status: Within Functional Limits for tasks assessed                                          Exercises      General Comments        Pertinent Vitals/Pain Pain Assessment Pain Assessment: 0-10 Pain Score: 6  Pain Location: Lumbar Spine Pain Descriptors / Indicators: Aching Pain Intervention(s): Monitored during session, Premedicated before session    Home Living                          Prior Function            PT Goals (current goals can now be found in the care plan  section) Acute Rehab PT Goals Patient Stated Goal: To return home safely PT Goal Formulation: With patient/family Time For Goal Achievement: 03/17/23 Potential to Achieve Goals: Good Progress towards PT goals: Progressing toward goals    Frequency    Min 1X/week      PT Plan      Co-evaluation              AM-PAC PT "6 Clicks" Mobility   Outcome Measure  Help needed turning from your back to your side while in a flat bed without using bedrails?: None Help needed moving from lying on your back to sitting on the side of a flat bed without using bedrails?: None Help needed moving to and from a bed to a chair (including a wheelchair)?: A Little Help needed standing up from a chair using your arms (e.g., wheelchair or bedside chair)?: A Lot Help needed to walk in hospital room?: A  Lot Help needed climbing 3-5 steps with a railing? : Total 6 Click Score: 16    End of Session   Activity Tolerance: Other (comment) (Patient limited by dizziness) Patient left: in chair Nurse Communication: Mobility status PT Visit Diagnosis: Difficulty in walking, not elsewhere classified (R26.2);Other abnormalities of gait and mobility (R26.89);Pain     Time: 0940-1010 PT Time Calculation (min) (ACUTE ONLY): 30 min  Charges:    $Therapeutic Activity: 23-37 mins PT General Charges $$ ACUTE PT VISIT: 1 Visit                     Ellin Goodie PT, DPT

## 2023-03-03 NOTE — TOC Progression Note (Addendum)
Transition of Care [facility]) - Progression Note    Patient Details  Name: Tony Williams MRN: [id number] Date of Birth: 09-08-1945  Transition of Care [facility]) CM/SW Contact  Margarito Liner, LCSW Phone Number: 03/03/2023, 8:49 AM  Clinical Narrative:   Received voicemail from insurance late yesterday. They stated that they do not feel patient is medically ready to transition to SNF yet because of blood pressure/dizziness issues. They said that CSW can resubmit authorization once improved and he is able to work with therapy or appeal. Will resubmit once BP/dizziness has improved. Daughter is aware.  12:52 pm: Patient was able to walk with PT today. CSW restarted SNF insurance authorization.  Expected Discharge Plan and Services                                               Social Determinants of Health (SDOH) Interventions SDOH Screenings   Food Insecurity: No Food Insecurity (02/15/2023)  Housing: Low Risk  (02/24/2023)  Transportation Needs: No Transportation Needs (02/18/2023)  Utilities: Not At Risk (02/15/2023)  Alcohol Screen: Low Risk  (08/10/2022)  Depression (PHQ2-9): Low Risk  (08/10/2022)  Financial Resource Strain: Low Risk  (02/18/2023)  Physical Activity: Inactive (05/19/2021)  Social Connections: Unknown (05/19/2021)  Stress: No Stress Concern Present (05/19/2021)  Recent Concern: Stress - Stress Concern Present (02/26/2021)  Tobacco Use: Medium Risk (02/18/2023)    Readmission Risk Interventions    02/15/2023    9:37 AM 12/11/2020   11:06 AM  Readmission Risk Prevention Plan  Transportation Screening Complete Complete  PCP or Specialist Appt within 5-7 Days  Complete  PCP or Specialist Appt within 3-5 Days Complete   Medication Review (RN CM)  Complete  HRI or Home Care Consult Complete   Social Work Consult for Recovery Care Planning/Counseling Complete   Palliative Care Screening Not Applicable   Medication Review Oceanographer) Not Complete   Med  Review Comments patient will discuss medication upon discharge

## 2023-03-03 NOTE — Progress Notes (Signed)
PROGRESS NOTE    Daytona Tram  ZOX:[id number] DOB: [birth year] DOA: 02/14/2023 PCP: Erasmo Downer, MD   Assessment & Plan:   Active Problems:   Acute on chronic systolic CHF (congestive heart failure) (HCC)   Acute kidney injury superimposed on CKD (HCC)   Acute on chronic respiratory failure with hypoxia (HCC)   Drop in hemoglobin   Chronic obstructive pulmonary disease (HCC)   Blurry vision, bilateral   Hypertension associated with diabetes (HCC)   Hyperlipidemia associated with type 2 diabetes mellitus (HCC)   Type II diabetes mellitus with renal manifestations (HCC)   CAD (coronary artery disease)   Myocardial injury   Chronic gout due to renal impairment without tophus   Depression with anxiety   OSA (obstructive sleep apnea)   Obesity, Class III, BMI 40-49.9 (morbid obesity) (HCC)   Dilated cardiomyopathy (HCC)   Acute delirium  Assessment and Plan:  Acute on chronic systolic CHF: last echo shows an EF of 30 to 35%. Continue on imdur, hydralazine, torsemide. No beta blocker secondary to bradycardia. Cardio recs apprec  AKI on CKDIIIb: Cr is labile. Avoid nephrotoxic meds    Acute on chronic hypoxic respiratory failure: continue on supplemental oxygen. Back at pt's baseline oxygen, 3L [place]    Acute blood loss anemia: will transfuse if Hb < 7.0   Urinary retention: s/p suprapubic cath place by uro 12/14.  Outpatient referral for Dr. Caryl Pina in [facility] was placed by inpatient uro    Blurry vision: bilateral. D/c toviaz   COPD: w/o acute exacerbation. Bronchodilators prn    DM2: HbA1c 6.8, well controlled. Diet controlled    Depression: severity unknown. Continue on home dose of lexapro    OSA: CPAP qhs   Morbid obesity: BMI 39.8. Complicates overall care & prognosis   Acute delirium: continue w/ seroquel   Constipation: started on lactulose    DVT prophylaxis: heparin  Code Status: full  Family Communication: discussed pt's care w/ pt's  wife and answered her questions  Disposition Plan: unclear as pt's insurance denied SNF placement but will reapply for insurance auth   Level of care: Telemetry Cardiac Consultants:  Uro  Cardio   Procedures:   Antimicrobials:    Subjective: Pt c/o malaise   Objective: Vitals:   03/03/23 0346 03/03/23 0459 03/03/23 0801 03/03/23 1006  BP: 138/64  139/66   Pulse: 82  78 88  Resp: 19  16   Temp: 98.8 F (37.1 C)  98.7 F (37.1 C)   TempSrc: Oral  Oral   SpO2: 99%  98%   Weight:  125 kg    Height:        Intake/Output Summary (Last 24 hours) at 03/03/2023 1040 Last data filed at 03/03/2023 0351 Gross per 24 hour  Intake 0 ml  Output 1200 ml  Net -1200 ml   Filed Weights   03/01/23 0500 03/02/23 0314 03/03/23 0459  Weight: 126.1 kg 125.1 kg 125 kg    Examination:  General exam: appears calm & comfortable  Respiratory system: clear breath sounds b/l  Cardiovascular system: S1/S2+. No rubs or clicks  Gastrointestinal system: abd is soft, NT, obese & hypoactive bowel sounds  Central nervous system: Alert & oriented. Moves all extremities  Psychiatry: Judgement and insight appears at baseline. Flat mood and affect    Data Reviewed: I have personally reviewed following labs and imaging studies  CBC: Recent Labs  Lab 02/25/23 0440 02/25/23 2057 02/26/23 0419 02/27/23 0416 02/28/23 0454 03/01/23 0428 03/02/23  0426  WBC 8.3 8.3 11.9* 15.1* 9.9  --   --   HGB 12.4* 12.6* 12.2* 9.7* 8.8* 8.7* 8.3*  HCT 39.3 39.3 38.3* 30.4* 27.5*  --   --   MCV 89.3 88.3 89.9 88.9 91.4  --   --   PLT 216 212 236 184 178  --   --    Basic Metabolic Panel: Recent Labs  Lab 02/25/23 0440 02/26/23 0419 02/27/23 0416 02/28/23 0454 03/01/23 0428 03/02/23 0426  NA 136 136 138 135 134*  --   K 3.4* 3.7 3.9 4.0 3.6  --   CL 90* 92* 96* 98 95*  --   CO2 34* 33* 30 31 29   --   GLUCOSE 129* 177* 151* 136* 145*  --   BUN 70* 61* 67* 56* 47*  --   CREATININE 2.20* 2.38*  3.04* 2.27* 2.15* 2.05*  CALCIUM 9.7 9.8 8.7* 8.6* 8.8*  --   MG 2.7*  --   --   --   --   --    GFR: Estimated Creatinine Clearance: 40.7 mL/min (A) (by C-G formula based on SCr of 2.05 mg/dL (H)). Liver Function Tests: No results for input(s): "AST", "ALT", "ALKPHOS", "BILITOT", "PROT", "ALBUMIN" in the last 168 hours. No results for input(s): "LIPASE", "AMYLASE" in the last 168 hours. No results for input(s): "AMMONIA" in the last 168 hours. Coagulation Profile: No results for input(s): "INR", "PROTIME" in the last 168 hours. Cardiac Enzymes: No results for input(s): "CKTOTAL", "CKMB", "CKMBINDEX", "TROPONINI" in the last 168 hours. BNP (last 3 results) No results for input(s): "PROBNP" in the last 8760 hours. HbA1C: No results for input(s): "HGBA1C" in the last 72 hours. CBG: Recent Labs  Lab 02/26/23 1628 02/26/23 1823  GLUCAP 175* 175*   Lipid Profile: No results for input(s): "CHOL", "HDL", "LDLCALC", "TRIG", "CHOLHDL", "LDLDIRECT" in the last 72 hours. Thyroid Function Tests: No results for input(s): "TSH", "T4TOTAL", "FREET4", "T3FREE", "THYROIDAB" in the last 72 hours. Anemia Panel: No results for input(s): "VITAMINB12", "FOLATE", "FERRITIN", "TIBC", "IRON", "RETICCTPCT" in the last 72 hours. Sepsis Labs: No results for input(s): "PROCALCITON", "LATICACIDVEN" in the last 168 hours.  No results found for this or any previous visit (from the past 240 hours).       Radiology Studies: No results found.      Scheduled Meds:  allopurinol  100 mg Oral Daily   aspirin EC  81 mg Oral Daily   cholecalciferol  4,000 Units Oral Daily   docusate sodium  200 mg Oral BID   escitalopram  20 mg Oral Daily   ezetimibe  10 mg Oral Daily   ferrous sulfate  325 mg Oral Q breakfast   heparin injection (subcutaneous)  5,000 Units Subcutaneous Q8H   hydrALAZINE  10 mg Oral Q8H   isosorbide mononitrate  30 mg Oral Daily   lidocaine  2 patch Transdermal Q24H   loratadine   10 mg Oral Daily   mirtazapine  15 mg Oral QHS   polyethylene glycol  17 g Oral Daily   pravastatin  40 mg Oral q1800   QUEtiapine  25 mg Oral QHS   sodium chloride flush  3 mL Intravenous Q12H   torsemide  20 mg Oral Daily   Continuous Infusions:   LOS: 17 days       Charise Killian, MD [facility] Pager [phone]  If 7PM-7AM, please contact night-coverage [email or web address] 03/03/2023, 10:40 AM

## 2023-03-04 DIAGNOSIS — I5033 Acute on chronic diastolic (congestive) heart failure: Secondary | ICD-10-CM | POA: Diagnosis not present

## 2023-03-04 LAB — BASIC METABOLIC PANEL
Anion gap: 14 (ref 5–15)
BUN: 45 mg/dL — ABNORMAL HIGH (ref 8–23)
CO2: 28 mmol/L (ref 22–32)
Calcium: 9.2 mg/dL (ref 8.9–10.3)
Chloride: 95 mmol/L — ABNORMAL LOW (ref 98–111)
Creatinine, Ser: 2.46 mg/dL — ABNORMAL HIGH (ref 0.61–1.24)
GFR, Estimated: 26 mL/min — ABNORMAL LOW (ref >60)
Glucose, Bld: 140 mg/dL — ABNORMAL HIGH (ref 70–99)
Potassium: 3.2 mmol/L — ABNORMAL LOW (ref 3.5–5.1)
Sodium: 137 mmol/L (ref 135–145)

## 2023-03-04 LAB — CBC
HCT: 29.9 % — ABNORMAL LOW (ref 39.0–52.0)
Hemoglobin: 9.3 g/dL — ABNORMAL LOW (ref 13.0–17.0)
MCH: 28.4 pg (ref 26.0–34.0)
MCHC: 31.1 g/dL (ref 30.0–36.0)
MCV: 91.2 fL (ref 80.0–100.0)
Platelets: 322 10*3/uL (ref 150–400)
RBC: 3.28 MIL/uL — ABNORMAL LOW (ref 4.22–5.81)
RDW: 16.6 % — ABNORMAL HIGH (ref 11.5–15.5)
WBC: 10.7 10*3/uL — ABNORMAL HIGH (ref 4.0–10.5)
nRBC: 0.2 % (ref 0.0–0.2)

## 2023-03-04 MED ORDER — LACTULOSE 10 GM/15ML PO SOLN
20.0000 g | Freq: Two times a day (BID) | ORAL | Status: DC | PRN
  Administered 2023-03-13 – 2023-03-15 (×2): 20 g via ORAL
  Filled 2023-03-04 (×2): qty 30

## 2023-03-04 NOTE — Plan of Care (Signed)
  Problem: Coping: Goal: Ability to adjust to condition or change in health will improve Outcome: Progressing   Problem: Fluid Volume: Goal: Ability to maintain a balanced intake and output will improve Outcome: Progressing   Problem: Health Behavior/Discharge Planning: Goal: Ability to identify and utilize available resources and services will improve Outcome: Progressing Goal: Ability to manage health-related needs will improve Outcome: Progressing   Problem: Metabolic: Goal: Ability to maintain appropriate glucose levels will improve Outcome: Progressing   Problem: Nutritional: Goal: Maintenance of adequate nutrition will improve Outcome: Progressing Goal: Progress toward achieving an optimal weight will improve Outcome: Progressing   Problem: Skin Integrity: Goal: Risk for impaired skin integrity will decrease Outcome: Progressing   Problem: Tissue Perfusion: Goal: Adequacy of tissue perfusion will improve Outcome: Progressing   Problem: Education: Goal: Ability to demonstrate management of disease process will improve Outcome: Progressing Goal: Ability to verbalize understanding of medication therapies will improve Outcome: Progressing   Problem: Activity: Goal: Capacity to carry out activities will improve Outcome: Progressing   Problem: Cardiac: Goal: Ability to achieve and maintain adequate cardiopulmonary perfusion will improve Outcome: Progressing   Problem: Education: Goal: Knowledge of General Education information will improve Description: Including pain rating scale, medication(s)/side effects and non-pharmacologic comfort measures Outcome: Progressing   Problem: Health Behavior/Discharge Planning: Goal: Ability to manage health-related needs will improve Outcome: Progressing   Problem: Clinical Measurements: Goal: Ability to maintain clinical measurements within normal limits will improve Outcome: Progressing Goal: Will remain free from  infection Outcome: Progressing Goal: Diagnostic test results will improve Outcome: Progressing Goal: Respiratory complications will improve Outcome: Progressing Goal: Cardiovascular complication will be avoided Outcome: Progressing   Problem: Activity: Goal: Risk for activity intolerance will decrease Outcome: Progressing   Problem: Nutrition: Goal: Adequate nutrition will be maintained Outcome: Progressing   Problem: Coping: Goal: Level of anxiety will decrease Outcome: Progressing   Problem: Elimination: Goal: Will not experience complications related to bowel motility Outcome: Progressing Goal: Will not experience complications related to urinary retention Outcome: Progressing   Problem: Pain Management: Goal: General experience of comfort will improve Outcome: Progressing   Problem: Safety: Goal: Ability to remain free from injury will improve Outcome: Progressing   Problem: Skin Integrity: Goal: Risk for impaired skin integrity will decrease Outcome: Progressing

## 2023-03-04 NOTE — Plan of Care (Signed)
  Problem: Coping: Goal: Ability to adjust to condition or change in health will improve Outcome: Progressing   Problem: Fluid Volume: Goal: Ability to maintain a balanced intake and output will improve Outcome: Progressing   Problem: Health Behavior/Discharge Planning: Goal: Ability to identify and utilize available resources and services will improve Outcome: Progressing   

## 2023-03-04 NOTE — TOC Progression Note (Addendum)
Transition of Care [facility]) - Progression Note    Patient Details  Name: Tony Williams MRN: [id number] Date of Birth: Sep 29, 1945  Transition of Care [facility]) CM/SW Contact  Margarito Liner, LCSW Phone Number: 03/04/2023, 8:59 AM  Clinical Narrative:  SNF insurance authorization is still pending.   12:10 pm: Auth still pending. [facility] admissions coordinator confirmed that they do accept weekend admissions.  Expected Discharge Plan and Services                                               Social Determinants of Health (SDOH) Interventions SDOH Screenings   Food Insecurity: No Food Insecurity (02/15/2023)  Housing: Low Risk  (02/24/2023)  Transportation Needs: No Transportation Needs (02/18/2023)  Utilities: Not At Risk (02/15/2023)  Alcohol Screen: Low Risk  (08/10/2022)  Depression (PHQ2-9): Low Risk  (08/10/2022)  Financial Resource Strain: Low Risk  (02/18/2023)  Physical Activity: Inactive (05/19/2021)  Social Connections: Unknown (05/19/2021)  Stress: No Stress Concern Present (05/19/2021)  Recent Concern: Stress - Stress Concern Present (02/26/2021)  Tobacco Use: Medium Risk (02/18/2023)    Readmission Risk Interventions    02/15/2023    9:37 AM 12/11/2020   11:06 AM  Readmission Risk Prevention Plan  Transportation Screening Complete Complete  PCP or Specialist Appt within 5-7 Days  Complete  PCP or Specialist Appt within 3-5 Days Complete   Medication Review (RN CM)  Complete  HRI or Home Care Consult Complete   Social Work Consult for Recovery Care Planning/Counseling Complete   Palliative Care Screening Not Applicable   Medication Review Oceanographer) Not Complete   Med Review Comments patient will discuss medication upon discharge

## 2023-03-04 NOTE — Consult Note (Signed)
[facility] Liaison Note  03/04/2023  Tony Williams 1946-02-19 [id number]  Location: RN Hospital Liaison screened the patient remotely at [facility].  Insurance: [facility] Medicare Advantage   Tony Williams is a 77 y.o. male who is a Primary Care Patient of Bacigalupo, Marzella Schlein, MD -[facility]. The patient was screened for  readmission hospitalization with noted high risk score for unplanned readmission risk with 1 IP/1 ED in 6 months.  The patient was assessed for potential Care Management service needs for post hospital transition for care coordination. Review of patient's electronic medical record reveals patient was admitted with congestive heart failure. Pt recommended for SNF level of care. Facility will continue to address pt's ongoing needs.  Plan: [facility] Liaison will continue to follow progress and disposition to asess for post hospital community care coordination/management needs.  Referral request for community care coordination: pending disposition.   VBCI Care Management/Population Health does not replace or interfere with any arrangements made by the Inpatient Transition of Care team.   For questions contact:   Elliot Cousin, RN, [facility] Liaison [place]   [facility] Population Health Office Hours MTWF  8:00 am-6:00 pm Direct Dial: [phone] mobile [phone] [Office toll free line] Office Hours are M-F 8:30 - 5 pm [name]

## 2023-03-04 NOTE — Care Management Important Message (Signed)
Important Message  Patient Details  Name: Tony Williams MRN: [id number] Date of Birth: 1945-11-16   Important Message Given:  Yes - Medicare IM     Bernadette Hoit 03/04/2023, 10:20 AM

## 2023-03-04 NOTE — Progress Notes (Signed)
PROGRESS NOTE    Tony Williams  ZOX:[id number] DOB: 05-29-1945 DOA: 02/14/2023 PCP: Tony Downer, MD   Assessment & Plan:   Active Problems:   Acute on chronic systolic CHF (congestive heart failure) (HCC)   Acute kidney injury superimposed on CKD (HCC)   Acute on chronic respiratory failure with hypoxia (HCC)   Drop in hemoglobin   Chronic obstructive pulmonary disease (HCC)   Blurry vision, bilateral   Hypertension associated with diabetes (HCC)   Hyperlipidemia associated with type 2 diabetes mellitus (HCC)   Type II diabetes mellitus with renal manifestations (HCC)   CAD (coronary artery disease)   Myocardial injury   Chronic gout due to renal impairment without tophus   Depression with anxiety   OSA (obstructive sleep apnea)   Obesity, Class III, BMI 40-49.9 (morbid obesity) (HCC)   Dilated cardiomyopathy (HCC)   Acute delirium  Assessment and Plan:  Acute on chronic systolic CHF: last echo shows an EF of 30 to 35%. Continue on imdur, hydralazine. Holding torsemide today. No beta blocker secondary to bradycardia. Cardio recs apprec  AKI on CKDIIIb: Cr is trending up today. Holding torsemide. Avoid nephrotoxic meds    Acute on chronic hypoxic respiratory failure: continue on supplemental oxygen. Back on baseline oxygen currently, 3L [place]   Acute blood loss anemia: H&H are trending up today. No need for transfusion currently   Urinary retention: s/p suprapubic cath place by uro 12/14.  Outpatient referral for Dr. Caryl Williams in [facility] was placed by inpatient uro    Blurry vision: bilateral. D/c toviaz   COPD: w/o acute exacerbation. Bronchodilators prn    DM2: HbA1c 6.8, well controlled. Diet controlled    Depression: severity unknown.Continue on home dose of lexapro    OSA: CPAP qhs   Morbid obesity: BMI 39.8. Complicates overall care & prognosis    Acute delirium: continue on seroquel   Constipation: lactulose prn    DVT prophylaxis:  heparin  Code Status: full  Family Communication:  Disposition Plan: will likely d/c to SNF but CM has to reapply for insurance auth as initial insurance auth was denied   Level of care: Telemetry Cardiac Consultants:  Uro  Cardio   Procedures:   Antimicrobials:    Subjective: Pt c/o fatigue   Objective: Vitals:   03/03/23 2312 03/04/23 0317 03/04/23 0500 03/04/23 0755  BP: (!) 148/65 (!) 122/59  (!) 127/59  Pulse: 71 81  75  Resp: 19 18  20   Temp: 97.9 F (36.6 C) 98.4 F (36.9 C)  98.6 F (37 C)  TempSrc:    Oral  SpO2: 100% 100%  99%  Weight:   125 kg   Height:        Intake/Output Summary (Last 24 hours) at 03/04/2023 0840 Last data filed at 03/04/2023 0322 Gross per 24 hour  Intake 120 ml  Output 475 ml  Net -355 ml   Filed Weights   03/02/23 0314 03/03/23 0459 03/04/23 0500  Weight: 125.1 kg 125 kg 125 kg    Examination:  General exam: appears comfortable  Respiratory system: clear breath sounds b/l  Cardiovascular system: S1 & S2+. No rubs or clicks  Gastrointestinal system: abd is soft, NT, obese & normal bowel sounds  Central nervous system: alert & awake. Moves all extremities  Psychiatry: Judgement and insight appears at baseline. Flat mood and affect    Data Reviewed: I have personally reviewed following labs and imaging studies  CBC: Recent Labs  Lab 02/26/23 0419 02/27/23  8657 02/28/23 0454 03/01/23 0428 03/02/23 0426 03/03/23 1127 03/04/23 0449  WBC 11.9* 15.1* 9.9  --   --  8.3 10.7*  HGB 12.2* 9.7* 8.8* 8.7* 8.3* 8.8* 9.3*  HCT 38.3* 30.4* 27.5*  --   --  28.1* 29.9*  MCV 89.9 88.9 91.4  --   --  91.8 91.2  PLT 236 184 178  --   --  302 322   Basic Metabolic Panel: Recent Labs  Lab 02/27/23 0416 02/28/23 0454 03/01/23 0428 03/02/23 0426 03/03/23 1127 03/04/23 0449  NA 138 135 134*  --  135 137  K 3.9 4.0 3.6  --  3.3* 3.2*  CL 96* 98 95*  --  93* 95*  CO2 30 31 29   --  28 28  GLUCOSE 151* 136* 145*  --  185* 140*   BUN 67* 56* 47*  --  43* 45*  CREATININE 3.04* 2.27* 2.15* 2.05* 2.23* 2.46*  CALCIUM 8.7* 8.6* 8.8*  --  9.1 9.2   GFR: Estimated Creatinine Clearance: 33.9 mL/min (A) (by C-G formula based on SCr of 2.46 mg/dL (H)). Liver Function Tests: No results for input(s): "AST", "ALT", "ALKPHOS", "BILITOT", "PROT", "ALBUMIN" in the last 168 hours. No results for input(s): "LIPASE", "AMYLASE" in the last 168 hours. No results for input(s): "AMMONIA" in the last 168 hours. Coagulation Profile: No results for input(s): "INR", "PROTIME" in the last 168 hours. Cardiac Enzymes: No results for input(s): "CKTOTAL", "CKMB", "CKMBINDEX", "TROPONINI" in the last 168 hours. BNP (last 3 results) No results for input(s): "PROBNP" in the last 8760 hours. HbA1C: No results for input(s): "HGBA1C" in the last 72 hours. CBG: Recent Labs  Lab 02/26/23 1628 02/26/23 1823  GLUCAP 175* 175*   Lipid Profile: No results for input(s): "CHOL", "HDL", "LDLCALC", "TRIG", "CHOLHDL", "LDLDIRECT" in the last 72 hours. Thyroid Function Tests: No results for input(s): "TSH", "T4TOTAL", "FREET4", "T3FREE", "THYROIDAB" in the last 72 hours. Anemia Panel: No results for input(s): "VITAMINB12", "FOLATE", "FERRITIN", "TIBC", "IRON", "RETICCTPCT" in the last 72 hours. Sepsis Labs: No results for input(s): "PROCALCITON", "LATICACIDVEN" in the last 168 hours.  No results found for this or any previous visit (from the past 240 hours).       Radiology Studies: No results found.      Scheduled Meds:  allopurinol  100 mg Oral Daily   aspirin EC  81 mg Oral Daily   cholecalciferol  4,000 Units Oral Daily   docusate sodium  200 mg Oral BID   escitalopram  20 mg Oral Daily   ezetimibe  10 mg Oral Daily   ferrous sulfate  325 mg Oral Q breakfast   heparin injection (subcutaneous)  5,000 Units Subcutaneous Q8H   hydrALAZINE  10 mg Oral Q8H   isosorbide mononitrate  30 mg Oral Daily   lactulose  20 g Oral BID    lidocaine  2 patch Transdermal Q24H   loratadine  10 mg Oral Daily   mirtazapine  15 mg Oral QHS   polyethylene glycol  17 g Oral Daily   pravastatin  40 mg Oral q1800   QUEtiapine  25 mg Oral QHS   sodium chloride flush  3 mL Intravenous Q12H   torsemide  20 mg Oral Daily   Continuous Infusions:   LOS: 18 days       Tony Killian, MD [facility] Pager [phone]  If 7PM-7AM, please contact night-coverage [email or web address] 03/04/2023, 8:40 AM

## 2023-03-05 DIAGNOSIS — I5033 Acute on chronic diastolic (congestive) heart failure: Secondary | ICD-10-CM | POA: Diagnosis not present

## 2023-03-05 LAB — BASIC METABOLIC PANEL
Anion gap: 11 (ref 5–15)
BUN: 48 mg/dL — ABNORMAL HIGH (ref 8–23)
CO2: 30 mmol/L (ref 22–32)
Calcium: 9.1 mg/dL (ref 8.9–10.3)
Chloride: 94 mmol/L — ABNORMAL LOW (ref 98–111)
Creatinine, Ser: 2.68 mg/dL — ABNORMAL HIGH (ref 0.61–1.24)
GFR, Estimated: 24 mL/min — ABNORMAL LOW (ref >60)
Glucose, Bld: 132 mg/dL — ABNORMAL HIGH (ref 70–99)
Potassium: 3.6 mmol/L (ref 3.5–5.1)
Sodium: 135 mmol/L (ref 135–145)

## 2023-03-05 LAB — CBC
HCT: 28.4 % — ABNORMAL LOW (ref 39.0–52.0)
Hemoglobin: 8.8 g/dL — ABNORMAL LOW (ref 13.0–17.0)
MCH: 28.6 pg (ref 26.0–34.0)
MCHC: 31 g/dL (ref 30.0–36.0)
MCV: 92.2 fL (ref 80.0–100.0)
Platelets: 340 10*3/uL (ref 150–400)
RBC: 3.08 MIL/uL — ABNORMAL LOW (ref 4.22–5.81)
RDW: 16.6 % — ABNORMAL HIGH (ref 11.5–15.5)
WBC: 9.1 10*3/uL (ref 4.0–10.5)
nRBC: 0.3 % — ABNORMAL HIGH (ref 0.0–0.2)

## 2023-03-05 MED ORDER — SODIUM CHLORIDE 0.9 % IV SOLN: INTRAVENOUS | Status: AC

## 2023-03-05 NOTE — Plan of Care (Signed)
  Problem: Fluid Volume: Goal: Ability to maintain a balanced intake and output will improve Outcome: Progressing   Problem: Education: Goal: Ability to demonstrate management of disease process will improve Outcome: Progressing   Problem: Activity: Goal: Capacity to carry out activities will improve Outcome: Progressing

## 2023-03-05 NOTE — Progress Notes (Addendum)
PROGRESS NOTE    Tony Williams  ZOX:[id number] DOB: 1946-02-07 DOA: 02/14/2023 PCP: Erasmo Downer, MD   Assessment & Plan:   Active Problems:   Acute on chronic systolic CHF (congestive heart failure) (HCC)   Acute kidney injury superimposed on CKD (HCC)   Acute on chronic respiratory failure with hypoxia (HCC)   Drop in hemoglobin   Chronic obstructive pulmonary disease (HCC)   Blurry vision, bilateral   Hypertension associated with diabetes (HCC)   Hyperlipidemia associated with type 2 diabetes mellitus (HCC)   Type II diabetes mellitus with renal manifestations (HCC)   CAD (coronary artery disease)   Myocardial injury   Chronic gout due to renal impairment without tophus   Depression with anxiety   OSA (obstructive sleep apnea)   Obesity, Class III, BMI 40-49.9 (morbid obesity) (HCC)   Dilated cardiomyopathy (HCC)   Acute delirium  Assessment and Plan:  Acute on chronic systolic CHF: last echo shows an EF of 30 to 35%. Continue on imdur, hydralazine. Continue to hold torsemide. No beta blocker secondary to bradycardia. Cardio recs apprec  AKI on CKDIIIb: Cr is trending up again today. Start IVF x 1 day. Holding torsemide. Avoid nephrotoxic meds    Acute on chronic hypoxic respiratory failure: continue on supplemental oxygen. Back on baseline oxygen currently, 3L [place]   Acute blood loss anemia: H&H are labile. No need for a transfusion currently   Urinary retention: s/p suprapubic cath place by uro 12/14.  Outpatient referral for Dr. Caryl Pina in [facility] was placed by inpatient uro    Blurry vision: bilateral. D/c toviaz   COPD: w/o acute exacerbation. Bronchodilators prn    DM2: HbA1c 6.8, well controlled. Diet controlled    Depression: severity unknown. Continue on home dose of lexapro   OSA: CPAP qhs   Morbid obesity: BMI 39.8. Complicates overall care & prognosis    Acute delirium: continue on seroquel   Constipation: lactulose prn     DVT prophylaxis: heparin  Code Status: full  Family Communication: discussed pt's care w/ pt's wife at bedside and answered her questions  Disposition Plan: waiting on repeat insurance auth   Level of care: Telemetry Cardiac Consultants:  Uro  Cardio   Procedures:   Antimicrobials:    Subjective: Pt c/o malaise   Objective: Vitals:   03/04/23 2333 03/05/23 0411 03/05/23 0500 03/05/23 0825  BP: 129/64 126/60  (!) 125/55  Pulse: 83 78  80  Resp: 19 19  18   Temp: 98.9 F (37.2 C) 98.4 F (36.9 C)  97.6 F (36.4 C)  TempSrc: Oral Oral    SpO2: 97% 100%  97%  Weight:   124.2 kg   Height:        Intake/Output Summary (Last 24 hours) at 03/05/2023 1053 Last data filed at 03/05/2023 0415 Gross per 24 hour  Intake 0 ml  Output 275 ml  Net -275 ml   Filed Weights   03/03/23 0459 03/04/23 0500 03/05/23 0500  Weight: 125 kg 125 kg 124.2 kg    Examination:  General exam: appears uncomfortable  Respiratory system: decreased breath sounds b/l  Cardiovascular system: S1/S2+. No rubs or clicks  Gastrointestinal system: abd is soft, NT, obese & normal bowel sounds Central nervous system: alert & awake. Moves all extremities  Psychiatry: Judgement and insight appears at baseline. Flat mood and affect    Data Reviewed: I have personally reviewed following labs and imaging studies  CBC: Recent Labs  Lab 02/27/23 0416 02/28/23 0454  03/01/23 0428 03/02/23 0426 03/03/23 1127 03/04/23 0449 03/05/23 0326  WBC 15.1* 9.9  --   --  8.3 10.7* 9.1  HGB 9.7* 8.8* 8.7* 8.3* 8.8* 9.3* 8.8*  HCT 30.4* 27.5*  --   --  28.1* 29.9* 28.4*  MCV 88.9 91.4  --   --  91.8 91.2 92.2  PLT 184 178  --   --  302 322 340   Basic Metabolic Panel: Recent Labs  Lab 02/28/23 0454 03/01/23 0428 03/02/23 0426 03/03/23 1127 03/04/23 0449 03/05/23 0326  NA 135 134*  --  135 137 135  K 4.0 3.6  --  3.3* 3.2* 3.6  CL 98 95*  --  93* 95* 94*  CO2 31 29  --  28 28 30   GLUCOSE 136*  145*  --  185* 140* 132*  BUN 56* 47*  --  43* 45* 48*  CREATININE 2.27* 2.15* 2.05* 2.23* 2.46* 2.68*  CALCIUM 8.6* 8.8*  --  9.1 9.2 9.1   GFR: Estimated Creatinine Clearance: 31 mL/min (A) (by C-G formula based on SCr of 2.68 mg/dL (H)). Liver Function Tests: No results for input(s): "AST", "ALT", "ALKPHOS", "BILITOT", "PROT", "ALBUMIN" in the last 168 hours. No results for input(s): "LIPASE", "AMYLASE" in the last 168 hours. No results for input(s): "AMMONIA" in the last 168 hours. Coagulation Profile: No results for input(s): "INR", "PROTIME" in the last 168 hours. Cardiac Enzymes: No results for input(s): "CKTOTAL", "CKMB", "CKMBINDEX", "TROPONINI" in the last 168 hours. BNP (last 3 results) No results for input(s): "PROBNP" in the last 8760 hours. HbA1C: No results for input(s): "HGBA1C" in the last 72 hours. CBG: Recent Labs  Lab 02/26/23 1628 02/26/23 1823  GLUCAP 175* 175*   Lipid Profile: No results for input(s): "CHOL", "HDL", "LDLCALC", "TRIG", "CHOLHDL", "LDLDIRECT" in the last 72 hours. Thyroid Function Tests: No results for input(s): "TSH", "T4TOTAL", "FREET4", "T3FREE", "THYROIDAB" in the last 72 hours. Anemia Panel: No results for input(s): "VITAMINB12", "FOLATE", "FERRITIN", "TIBC", "IRON", "RETICCTPCT" in the last 72 hours. Sepsis Labs: No results for input(s): "PROCALCITON", "LATICACIDVEN" in the last 168 hours.  No results found for this or any previous visit (from the past 240 hours).       Radiology Studies: No results found.      Scheduled Meds:  allopurinol  100 mg Oral Daily   aspirin EC  81 mg Oral Daily   cholecalciferol  4,000 Units Oral Daily   docusate sodium  200 mg Oral BID   escitalopram  20 mg Oral Daily   ezetimibe  10 mg Oral Daily   ferrous sulfate  325 mg Oral Q breakfast   heparin injection (subcutaneous)  5,000 Units Subcutaneous Q8H   hydrALAZINE  10 mg Oral Q8H   isosorbide mononitrate  30 mg Oral Daily   lidocaine   2 patch Transdermal Q24H   loratadine  10 mg Oral Daily   mirtazapine  15 mg Oral QHS   polyethylene glycol  17 g Oral Daily   pravastatin  40 mg Oral q1800   QUEtiapine  25 mg Oral QHS   sodium chloride flush  3 mL Intravenous Q12H   Continuous Infusions:   LOS: 19 days       Charise Killian, MD [facility] Pager [phone]  If 7PM-7AM, please contact night-coverage [email or web address] 03/05/2023, 10:53 AM

## 2023-03-06 DIAGNOSIS — I5033 Acute on chronic diastolic (congestive) heart failure: Secondary | ICD-10-CM | POA: Diagnosis not present

## 2023-03-06 LAB — BASIC METABOLIC PANEL
Anion gap: 10 (ref 5–15)
BUN: 46 mg/dL — ABNORMAL HIGH (ref 8–23)
CO2: 29 mmol/L (ref 22–32)
Calcium: 8.6 mg/dL — ABNORMAL LOW (ref 8.9–10.3)
Chloride: 97 mmol/L — ABNORMAL LOW (ref 98–111)
Creatinine, Ser: 2.55 mg/dL — ABNORMAL HIGH (ref 0.61–1.24)
GFR, Estimated: 25 mL/min — ABNORMAL LOW (ref >60)
Glucose, Bld: 125 mg/dL — ABNORMAL HIGH (ref 70–99)
Potassium: 3.6 mmol/L (ref 3.5–5.1)
Sodium: 136 mmol/L (ref 135–145)

## 2023-03-06 LAB — CBC
HCT: 27 % — ABNORMAL LOW (ref 39.0–52.0)
Hemoglobin: 8.5 g/dL — ABNORMAL LOW (ref 13.0–17.0)
MCH: 28.3 pg (ref 26.0–34.0)
MCHC: 31.5 g/dL (ref 30.0–36.0)
MCV: 90 fL (ref 80.0–100.0)
Platelets: 321 10*3/uL (ref 150–400)
RBC: 3 MIL/uL — ABNORMAL LOW (ref 4.22–5.81)
RDW: 16.8 % — ABNORMAL HIGH (ref 11.5–15.5)
WBC: 8.7 10*3/uL (ref 4.0–10.5)
nRBC: 0.3 % — ABNORMAL HIGH (ref 0.0–0.2)

## 2023-03-06 NOTE — Plan of Care (Signed)
  Problem: Coping: Goal: Ability to adjust to condition or change in health will improve Outcome: Progressing   Problem: Fluid Volume: Goal: Ability to maintain a balanced intake and output will improve Outcome: Progressing   Problem: Health Behavior/Discharge Planning: Goal: Ability to identify and utilize available resources and services will improve Outcome: Progressing Goal: Ability to manage health-related needs will improve Outcome: Progressing   

## 2023-03-06 NOTE — Plan of Care (Signed)
  Problem: Coping: Goal: Ability to adjust to condition or change in health will improve Outcome: Progressing   Problem: Cardiac: Goal: Ability to achieve and maintain adequate cardiopulmonary perfusion will improve Outcome: Progressing   Problem: Pain Management: Goal: General experience of comfort will improve Outcome: Progressing   Problem: Safety: Goal: Ability to remain free from injury will improve Outcome: Progressing   Problem: Skin Integrity: Goal: Risk for impaired skin integrity will decrease Outcome: Progressing

## 2023-03-06 NOTE — Progress Notes (Signed)
PROGRESS NOTE   HPI was taken from Dr. Clyde Lundborg: Tony Williams is a 77 y.o. male with medical history significant of dCHF, HLD, HTN, DM, COPD on 3L oxygen, CAD (s/p of CABG and stent), gout, CKD-3b, depression with anxiety, morbid obesity, OSA, former smoker, bilateral cancer, who presents with SOB.   Patient states that he has shortness of breath in the past several weeks which has been progressively worsening.  Patient is dry cough, no fever or chills.  He had mild substernal chest pain earlier, which has resolved. Patient also has worsening bilateral lower leg edema and 15 pounds of weight gain recently.  Patient has nausea, no vomiting, diarrhea or abdominal pain.  No symptoms of UTI.  Patient states he has intermittent bilateral blurry vision for more than 3 weeks.  He reports mild numbness in both feet, and generalized weakness.  No unilateral weakness in extremities.  No facial droop or slurred speech.   Patient is normally using 3 L oxygen at baseline, but found to have acute respiratory distress, with oxygen desaturation to 82% and difficulty speaking in full sentence, currently requiring 6 L oxygen with 93% of saturation.   Data reviewed independently and ED Course: pt was found to have BNP 627.9, troponin 609, worsening renal function with creatinine 2.79, BUN 61, GFR 23 (recent baseline creatinine 2.12 on 08/31/2022).  Temperature normal, blood pressure 125/54, heart rate 68, RR 17.  Patient is admitted to PCU as inpatient. Consulted Dr. Jimmey Ralph of card who will inform provider tomorrow.      CXR: Cardiomegaly with vascular congestion and probable mild interstitial edema. Chronic pleural and parenchymal scarring in the right thorax  As per Dr. Mayford Knife 12/18-12/22/24: Pt's Cr started to rise again so torsemide was held and started IVFs x 1 day and trending down slightly today. Of note, insurance [employer] was denied b/c pt was not able to participate w/ therapy b/c of dizziness but CM reapplied  for insurance auth and that is pending currently.    Zachai Quattrone  QIO:[id number] DOB: March 11, 1946 DOA: 02/14/2023 PCP: Erasmo Downer, MD   Assessment & Plan:   Active Problems:   Acute on chronic systolic CHF (congestive heart failure) (HCC)   Acute kidney injury superimposed on CKD (HCC)   Acute on chronic respiratory failure with hypoxia (HCC)   Drop in hemoglobin   Chronic obstructive pulmonary disease (HCC)   Blurry vision, bilateral   Hypertension associated with diabetes (HCC)   Hyperlipidemia associated with type 2 diabetes mellitus (HCC)   Type II diabetes mellitus with renal manifestations (HCC)   CAD (coronary artery disease)   Myocardial injury   Chronic gout due to renal impairment without tophus   Depression with anxiety   OSA (obstructive sleep apnea)   Obesity, Class III, BMI 40-49.9 (morbid obesity) (HCC)   Dilated cardiomyopathy (HCC)   Acute delirium  Assessment and Plan:  Acute on chronic systolic CHF: last echo shows an EF of 30 to 35%. Continue on imdur, hydralazine. Continue to hold torsemide secondary to Cr trending up. No beta blocker secondary to bradycardia. Cardio recs apprec  AKI on CKDIIIb: Cr is trending down today. Holding torsemide. S/p IVFs x 1 day.  Avoid nephrotoxic meds    Acute on chronic hypoxic respiratory failure: continue on supplemental oxygen. Back on baseline oxygen currently, 3L [place]   Acute blood loss anemia: H&H are labile. Will transfuse if Hb < 7.0   Urinary retention: s/p suprapubic cath place by uro 12/14.  Outpatient referral  for Dr. Caryl Pina in [facility] was placed by inpatient uro    Blurry vision: bilateral. D/c toviaz   COPD: w/o acute exacerbation. Bronchodilators prn    DM2: HbA1c 6.8, well controlled. Diet controlled     Depression: severity unknown. Continue on home dose of lexapro    OSA: CPAP qhs   Morbid obesity: BMI 39.8. Complicates overall care & prognosis    Acute delirium: continue on  seroquel   Constipation: lactulose prn    DVT prophylaxis: heparin  Code Status: full  Family Communication: discussed pt's care w/ pt's family at bedside and answered their questions  Disposition Plan: waiting on repeat insurance auth   Level of care: Telemetry Cardiac  Status is: Inpatient Remains inpatient appropriate because: waiting  on repeat insurance auth     Consultants:  Uro  Cardio   Procedures:   Antimicrobials:    Subjective: Pt c/o fatigue   Objective: Vitals:   03/06/23 0345 03/06/23 0547 03/06/23 0634 03/06/23 0809  BP: 124/62  (!) 135/58 (!) 123/59  Pulse: 76   84  Resp: 18   18  Temp: 98.7 F (37.1 C)   98.7 F (37.1 C)  TempSrc:      SpO2: 98%   96%  Weight:  125.3 kg    Height:        Intake/Output Summary (Last 24 hours) at 03/06/2023 1152 Last data filed at 03/06/2023 0133 Gross per 24 hour  Intake 310.04 ml  Output 400 ml  Net -89.96 ml   Filed Weights   03/04/23 0500 03/05/23 0500 03/06/23 0547  Weight: 125 kg 124.2 kg 125.3 kg    Examination:  General exam: appears calm & comfortable  Respiratory system: diminished breath sounds b/l Cardiovascular system: S1 & S2+. No rubs or clicks   Gastrointestinal system: abd is soft, NT, obese & hypoactive bowel sounds Central nervous system: alert & awake. Moves all extremities  Psychiatry: Judgement and insight appears at baseline. Flat mood and affect    Data Reviewed: I have personally reviewed following labs and imaging studies  CBC: Recent Labs  Lab 02/28/23 0454 03/01/23 0428 03/02/23 0426 03/03/23 1127 03/04/23 0449 03/05/23 0326 03/06/23 0427  WBC 9.9  --   --  8.3 10.7* 9.1 8.7  HGB 8.8*   < > 8.3* 8.8* 9.3* 8.8* 8.5*  HCT 27.5*  --   --  28.1* 29.9* 28.4* 27.0*  MCV 91.4  --   --  91.8 91.2 92.2 90.0  PLT 178  --   --  302 322 340 321   < > = values in this interval not displayed.   Basic Metabolic Panel: Recent Labs  Lab 03/01/23 0428 03/02/23 0426  03/03/23 1127 03/04/23 0449 03/05/23 0326 03/06/23 0427  NA 134*  --  135 137 135 136  K 3.6  --  3.3* 3.2* 3.6 3.6  CL 95*  --  93* 95* 94* 97*  CO2 29  --  28 28 30 29   GLUCOSE 145*  --  185* 140* 132* 125*  BUN 47*  --  43* 45* 48* 46*  CREATININE 2.15* 2.05* 2.23* 2.46* 2.68* 2.55*  CALCIUM 8.8*  --  9.1 9.2 9.1 8.6*   GFR: Estimated Creatinine Clearance: 32.7 mL/min (A) (by C-G formula based on SCr of 2.55 mg/dL (H)). Liver Function Tests: No results for input(s): "AST", "ALT", "ALKPHOS", "BILITOT", "PROT", "ALBUMIN" in the last 168 hours. No results for input(s): "LIPASE", "AMYLASE" in the last 168 hours. No  results for input(s): "AMMONIA" in the last 168 hours. Coagulation Profile: No results for input(s): "INR", "PROTIME" in the last 168 hours. Cardiac Enzymes: No results for input(s): "CKTOTAL", "CKMB", "CKMBINDEX", "TROPONINI" in the last 168 hours. BNP (last 3 results) No results for input(s): "PROBNP" in the last 8760 hours. HbA1C: No results for input(s): "HGBA1C" in the last 72 hours. CBG: No results for input(s): "GLUCAP" in the last 168 hours.  Lipid Profile: No results for input(s): "CHOL", "HDL", "LDLCALC", "TRIG", "CHOLHDL", "LDLDIRECT" in the last 72 hours. Thyroid Function Tests: No results for input(s): "TSH", "T4TOTAL", "FREET4", "T3FREE", "THYROIDAB" in the last 72 hours. Anemia Panel: No results for input(s): "VITAMINB12", "FOLATE", "FERRITIN", "TIBC", "IRON", "RETICCTPCT" in the last 72 hours. Sepsis Labs: No results for input(s): "PROCALCITON", "LATICACIDVEN" in the last 168 hours.  No results found for this or any previous visit (from the past 240 hours).       Radiology Studies: No results found.      Scheduled Meds:  allopurinol  100 mg Oral Daily   aspirin EC  81 mg Oral Daily   cholecalciferol  4,000 Units Oral Daily   docusate sodium  200 mg Oral BID   escitalopram  20 mg Oral Daily   ezetimibe  10 mg Oral Daily   ferrous  sulfate  325 mg Oral Q breakfast   heparin injection (subcutaneous)  5,000 Units Subcutaneous Q8H   hydrALAZINE  10 mg Oral Q8H   isosorbide mononitrate  30 mg Oral Daily   lidocaine  2 patch Transdermal Q24H   loratadine  10 mg Oral Daily   mirtazapine  15 mg Oral QHS   polyethylene glycol  17 g Oral Daily   pravastatin  40 mg Oral q1800   QUEtiapine  25 mg Oral QHS   sodium chloride flush  3 mL Intravenous Q12H   Continuous Infusions:   LOS: 20 days       Charise Killian, MD [facility] Pager [phone]  If 7PM-7AM, please contact night-coverage [email or web address] 03/06/2023, 11:52 AM

## 2023-03-07 ENCOUNTER — Inpatient Hospital Stay: Payer: Medicare Other

## 2023-03-07 ENCOUNTER — Encounter: Payer: Medicare Other | Admitting: Family

## 2023-03-07 ENCOUNTER — Encounter: Payer: Self-pay | Admitting: Radiology

## 2023-03-07 DIAGNOSIS — R110 Nausea: Secondary | ICD-10-CM

## 2023-03-07 DIAGNOSIS — N179 Acute kidney failure, unspecified: Secondary | ICD-10-CM | POA: Diagnosis not present

## 2023-03-07 DIAGNOSIS — I509 Heart failure, unspecified: Secondary | ICD-10-CM | POA: Diagnosis not present

## 2023-03-07 DIAGNOSIS — R079 Chest pain, unspecified: Secondary | ICD-10-CM

## 2023-03-07 DIAGNOSIS — J9621 Acute and chronic respiratory failure with hypoxia: Secondary | ICD-10-CM | POA: Diagnosis not present

## 2023-03-07 LAB — BASIC METABOLIC PANEL
Anion gap: 12 (ref 5–15)
BUN: 42 mg/dL — ABNORMAL HIGH (ref 8–23)
CO2: 26 mmol/L (ref 22–32)
Calcium: 9.2 mg/dL (ref 8.9–10.3)
Chloride: 101 mmol/L (ref 98–111)
Creatinine, Ser: 2.19 mg/dL — ABNORMAL HIGH (ref 0.61–1.24)
GFR, Estimated: 30 mL/min — ABNORMAL LOW (ref >60)
Glucose, Bld: 138 mg/dL — ABNORMAL HIGH (ref 70–99)
Potassium: 4.1 mmol/L (ref 3.5–5.1)
Sodium: 139 mmol/L (ref 135–145)

## 2023-03-07 LAB — TROPONIN I (HIGH SENSITIVITY)
Troponin I (High Sensitivity): 192 ng/L (ref <18)
Troponin I (High Sensitivity): 580 ng/L (ref <18)

## 2023-03-07 LAB — CBC
HCT: 29.1 % — ABNORMAL LOW (ref 39.0–52.0)
Hemoglobin: 9 g/dL — ABNORMAL LOW (ref 13.0–17.0)
MCH: 28 pg (ref 26.0–34.0)
MCHC: 30.9 g/dL (ref 30.0–36.0)
MCV: 90.4 fL (ref 80.0–100.0)
Platelets: 336 10*3/uL (ref 150–400)
RBC: 3.22 MIL/uL — ABNORMAL LOW (ref 4.22–5.81)
RDW: 17 % — ABNORMAL HIGH (ref 11.5–15.5)
WBC: 9.1 10*3/uL (ref 4.0–10.5)
nRBC: 0 % (ref 0.0–0.2)

## 2023-03-07 MED ORDER — ISOSORBIDE MONONITRATE ER 60 MG PO TB24
60.0000 mg | ORAL_TABLET | Freq: Every day | ORAL | Status: DC
  Administered 2023-03-08 – 2023-03-15 (×7): 60 mg via ORAL
  Filled 2023-03-07 (×7): qty 1

## 2023-03-07 MED ORDER — OXYCODONE-ACETAMINOPHEN 5-325 MG PO TABS
1.0000 | ORAL_TABLET | Freq: Once | ORAL | Status: AC
  Administered 2023-03-07: 1 via ORAL
  Filled 2023-03-07: qty 1

## 2023-03-07 MED ORDER — ONDANSETRON HCL 4 MG/2ML IJ SOLN
4.0000 mg | Freq: Four times a day (QID) | INTRAMUSCULAR | Status: DC
  Administered 2023-03-07 – 2023-03-08 (×5): 4 mg via INTRAVENOUS
  Filled 2023-03-07 (×7): qty 2

## 2023-03-07 NOTE — Assessment & Plan Note (Signed)
Will give around-the-clock Zofran.  Get a CT scan of the head because he cannot do an MRI.

## 2023-03-07 NOTE — Progress Notes (Signed)
Progress Note   Patient: Tony Williams ZOX:[id number] DOB: 1945-05-31 DOA: 02/14/2023     21 DOS: the patient was seen and examined on 03/07/2023   Brief hospital course: 77 year old man past medical history of congestive heart failure, hyperlipidemia, hypertension, type 2 diabetes mellitus, COPD on 3 L of oxygen, CAD, gout, morbid obesity, CKD, depression and anxiety, obstructive sleep apnea presented with shortness of breath.  Patient was admitted with heart failure exacerbation.  Patient pulled out Foley catheter on 12/10 and had hematuria and clots.  Foley placed on 12/11.  Patient had suprapubic catheter placed on 12/14.  12/15.  Creatinine increased to 3.05.  Held torsemide and gentle IV fluids overnight.  Hemoglobin did drift down to 9.7.  Continue to watch closely. 12/16.  Creatinine down to 2.27.  Discontinue fluids.  Hemoglobin drifted down to 8.8.  Recheck creatinine tomorrow.  Recheck hemoglobin tomorrow.  Hold aspirin and heparin subcutaneous injection. 12/17.  Creatinine down to 2.15 and hemoglobin 8.7.  Notified by transitional care team the rehab that they were planning on going to is out of network with his insurance.  TOC to expand bed search.  As per Dr. Mayford Knife 12/18-12/22/24: Pt's Cr started to rise again so torsemide was held and started IVFs x 1 day and trending down slightly today. Of note, insurance [employer] was denied b/c pt was not able to participate w/ therapy b/c of dizziness but CM reapplied for insurance auth and that is pending currently.   12/23.  Patient complaining of nausea consistently for the past 3 days.  He has been able to eat but still feeling very nauseous.  Patient states he is claustrophobic and cannot get into MRI.  Will get a CT scan of the head.  Patient also complained of chest pain.  First troponin 192 and second troponin up at 580.  Assessment and Plan: * Chest pain Chest pain with elevated troponin.  This morning with flipped T waves laterally  and inferiorly.  Troponin elevated at 192 and second troponin elevated to 580.  Cardiology consultation.  With his chronic kidney disease not a good candidate for cardiac catheterization.  On pravastatin, Imdur, aspirin.  Acute on chronic systolic CHF (congestive heart failure) (HCC) Last echocardiogram shows an EF of 30 to 35%.  Patient on hydralazine, Imdur.  No beta-blocker with bradycardia.  Torsemide on hold.  Acute kidney injury superimposed on CKD (HCC) AKI on CKD stage IIIb.  Creatinine has been variable during the hospital course.  Last creatinine 2.19.  Acute on chronic respiratory failure with hypoxia (HCC) Patient on 3.5 L this morning.  Drop in hemoglobin Acute blood loss anemia.  Patient did have a drop in hemoglobin during the hospital course but had hematuria especially after the suprapubic tube.  Last hemoglobin 9.0.  On oral iron.  Blurry vision, bilateral Discontinue Toviaz  Chronic obstructive pulmonary disease (HCC) Respiratory status stable  Type II diabetes mellitus with renal manifestations (HCC) Last hemoglobin A1c 6.8.  Patient on diet control.  Depression with anxiety On Lexapro  OSA (obstructive sleep apnea) CPAP at night  Obesity, Class III, BMI 40-49.9 (morbid obesity) (HCC) BMI 39.54  Nausea Will give around-the-clock Zofran.  Get a CT scan of the head because he cannot do an MRI.  Acute delirium Improved with Seroquel        Subjective: Patient states he has been nauseous for 3 days.  Patient also complained of chest pain this morning.  Left side of his chest radiating up into his  shoulder.  No reproducible component.  No vomiting.  Physical Exam: Vitals:   03/07/23 0536 03/07/23 0644 03/07/23 0734 03/07/23 1217  BP: (!) 148/73 (!) 145/69 (!) 143/69 137/69  Pulse: 92  95 88  Resp:   20 20  Temp:   98.4 F (36.9 C) 97.9 F (36.6 C)  TempSrc:   Oral Oral  SpO2:   99% 100%  Weight:      Height:       Physical Exam HENT:      Head: Normocephalic.     Mouth/Throat:     Pharynx: No oropharyngeal exudate.  Eyes:     General: Lids are normal.     Conjunctiva/sclera: Conjunctivae normal.  Cardiovascular:     Rate and Rhythm: Normal rate and regular rhythm.     Heart sounds: Normal heart sounds, S1 normal and S2 normal.  Pulmonary:     Breath sounds: Examination of the right-lower field reveals decreased breath sounds. Examination of the left-lower field reveals decreased breath sounds. Decreased breath sounds present. No wheezing, rhonchi or rales.  Abdominal:     Palpations: Abdomen is soft.     Tenderness: There is no abdominal tenderness.  Musculoskeletal:     Right lower leg: No swelling.     Left lower leg: No swelling.  Skin:    General: Skin is warm.     Findings: No rash.  Neurological:     Mental Status: He is alert.     Data Reviewed: Creatinine 2.19 EKG interpreted by me shows an intraventricular block flipped T waves laterally and inferiorly in sinus rhythm. First troponin 192, second troponin 580 Last hemoglobin 9  Family Communication: Spoke with wife at the bedside  Disposition: Status is: Inpatient Remains inpatient appropriate because: Complaining of nausea for 3 days and chest pain today.  Planned Discharge Destination: Skilled nursing facility    Time spent: 28 minutes  Author: Alford Highland, MD 03/07/2023 12:33 PM  For on call review [email or web address].

## 2023-03-07 NOTE — Progress Notes (Signed)
Rounding Note    Patient Name: Tony Williams Date of Encounter: 03/07/2023  [facility] Cardiologist: Debbe Odea, MD   Subjective   Patient followed by heart failure team earlier in admission, last seen by cardiology 12/15. Cardiology was asked to reevaluate patient today due to new symptoms of chest pain. Patient reports he noticed a dull, achy pain in his left chest and left shoulder blade this morning ~0500 at rest with associated shortness of breath and lightheadedness. Unchanged by movement. He does note that he has been experiencing nausea and diarrhea for the past 3 days, thinks his symptoms may be related to gas. Denies palpitations and diaphoresis. He was given sublingual nitroglycerin x2 with little relief of symptoms. Was later given oxycodone which relieved symptoms completely. He is chest pain free at this time.   Inpatient Medications    Scheduled Meds:  allopurinol  100 mg Oral Daily   aspirin EC  81 mg Oral Daily   cholecalciferol  4,000 Units Oral Daily   docusate sodium  200 mg Oral BID   escitalopram  20 mg Oral Daily   ezetimibe  10 mg Oral Daily   ferrous sulfate  325 mg Oral Q breakfast   heparin injection (subcutaneous)  5,000 Units Subcutaneous Q8H   hydrALAZINE  10 mg Oral Q8H   isosorbide mononitrate  30 mg Oral Daily   lidocaine  2 patch Transdermal Q24H   loratadine  10 mg Oral Daily   mirtazapine  15 mg Oral QHS   ondansetron (ZOFRAN) IV  4 mg Intravenous Q6H   polyethylene glycol  17 g Oral Daily   pravastatin  40 mg Oral q1800   QUEtiapine  25 mg Oral QHS   sodium chloride flush  3 mL Intravenous Q12H   Continuous Infusions:  PRN Meds: acetaminophen, albuterol, dextromethorphan-guaiFENesin, lactulose, meclizine, nitroGLYCERIN, mouth rinse, oxyCODONE, polyvinyl alcohol   Vital Signs    Vitals:   03/07/23 0530 03/07/23 0536 03/07/23 0644 03/07/23 0734  BP: (!) 140/67 (!) 148/73 (!) 145/69 (!) 143/69  Pulse: 91 92  95   Resp:    20  Temp:    98.4 F (36.9 C)  TempSrc:    Oral  SpO2:    99%  Weight:      Height:        Intake/Output Summary (Last 24 hours) at 03/07/2023 0904 Last data filed at 03/07/2023 0359 Gross per 24 hour  Intake 3 ml  Output 650 ml  Net -647 ml      03/07/2023    3:58 AM 03/06/2023    5:47 AM 03/05/2023    5:00 AM  Last 3 Weights  Weight (lbs) 275 lb 9.2 oz 276 lb 3.8 oz 273 lb 13 oz  Weight (kg) 125 kg 125.3 kg 124.2 kg      ECG    Sinus rhythm, rate 94, with conduction delay and nonspecific ST changes in inferior and lateral leads, unchanged from previous EKG - Personally Reviewed  Physical Exam   GEN: No acute distress.   Neck: No JVD Cardiac: RRR, no murmurs, rubs, or gallops.  Respiratory: Diminished breath sounds bilaterally GI: Soft, nontender, non-distended  MS: No edema; No deformity. Neuro:  Nonfocal  Psych: Normal affect   Labs    High Sensitivity Troponin:   Recent Labs  Lab 02/14/23 2152 02/15/23 0205 02/15/23 0653 02/15/23 1058 02/15/23 1859  TROPONINIHS 550* 535* 541* 458* 314*     Chemistry Recent Labs  Lab 03/05/23 0326  03/06/23 0427 03/07/23 0434  NA 135 136 139  K 3.6 3.6 4.1  CL 94* 97* 101  CO2 30 29 26   GLUCOSE 132* 125* 138*  BUN 48* 46* 42*  CREATININE 2.68* 2.55* 2.19*  CALCIUM 9.1 8.6* 9.2  GFRNONAA 24* 25* 30*  ANIONGAP 11 10 12     Lipids No results for input(s): "CHOL", "TRIG", "HDL", "LABVLDL", "LDLCALC", "CHOLHDL" in the last 168 hours.  Hematology Recent Labs  Lab 03/05/23 0326 03/06/23 0427 03/07/23 0434  WBC 9.1 8.7 9.1  RBC 3.08* 3.00* 3.22*  HGB 8.8* 8.5* 9.0*  HCT 28.4* 27.0* 29.1*  MCV 92.2 90.0 90.4  MCH 28.6 28.3 28.0  MCHC 31.0 31.5 30.9  RDW 16.6* 16.8* 17.0*  PLT 340 321 336   Thyroid No results for input(s): "TSH", "FREET4" in the last 168 hours.  BNPNo results for input(s): "BNP", "PROBNP" in the last 168 hours.  DDimer No results for input(s): "DDIMER" in the last 168 hours.    Radiology    No results found.  Cardiac Studies   02/15/23 Echocardiogram 1. Left ventricular ejection fraction, by estimation, is 30 to 35%. The  left ventricle has moderately decreased function. Left ventricular  endocardial border not optimally defined to evaluate regional wall motion.  The left ventricular internal cavity  size was mildly to moderately dilated. There is mild left ventricular  hypertrophy. Left ventricular diastolic parameters are consistent with  Grade II diastolic dysfunction (pseudonormalization). Elevated left atrial  pressure.   2. Right ventricular systolic function was not well visualized. The right  ventricular size is not well visualized.   3. Left atrial size was mildly dilated.   4. Right atrial size was moderately dilated.   5. The mitral valve was not well visualized. No evidence of mitral valve  regurgitation. No evidence of mitral stenosis.   6. The aortic valve has an indeterminant number of cusps. Aortic valve  regurgitation is trivial.   7. Pulmonic valve regurgitation not well-assessed.   10/29/21 Echocardiogram 1. Left ventricular ejection fraction, by estimation, is 50 to 55%. The  left ventricle has low normal function. Select images concerning for  inferior wall hypokinesis. Left ventricular diastolic parameters are  consistent with Grade I diastolic  dysfunction (impaired relaxation).   2. Right ventricular systolic function is normal. The right ventricular  size is normal.   3. Left atrial size was mildly dilated.   4. The mitral valve was not well visualized. Mild mitral valve  regurgitation. No evidence of mitral stenosis.   5. The aortic valve was not well visualized. Aortic valve regurgitation  is mild. No aortic stenosis is present.   6. The inferior vena cava is normal in size with greater than 50%  respiratory variability, suggesting right atrial pressure of 3 mmHg.    Patient Profile     77 y.o. male with history of  CAD/CABG x 42007, HFrEF EF 35%, hypertension, COPD, CKD presenting with shortness of breath being seen for acute CHF exacerbation.  Hospital course complicated by urinary tract obstruction, hematuria. Cardiology was asked to reevaluate due to new symptom of chest pain.  Assessment & Plan   Chest pain CAD  - Patient reports dull, achy pain in his left chest and left shoulder blade with associated shortness of breath and lightheadedness this morning 12/23 ~0500. Little relief with sublingual nitroglycerin x2. Full relief with oxycodone.  - EKG unchanged from previous - Troponin 192, improved from prior (peaked at 609 on 12/2) - Chest pain  free at this time - Patient is not a candidate for catheterization due to comorbidities - Continue aspirin 81 mg, ezetimibe 10 mg, Imdur 30 mg, and pravastatin 40 mg  Acute on chronic CHF - Echo this admission shows LVEF 30-35% - Torsemide held due to uptrending Cr - No BB due to bradycardia - Continue Imdur 30 mg, hydralazine 10 mg q8hr  For questions or updates, please contact [facility] Please consult [email or web address] for contact info under   Signed, Orion Crook, PA-C  03/07/2023, 9:04 AM

## 2023-03-07 NOTE — Care Management Important Message (Signed)
Important Message  Patient Details  Name: Tony Williams MRN: [id number] Date of Birth: Jul 07, 1945   Important Message Given:  Yes - Medicare IM     Bernadette Hoit 03/07/2023, 11:57 AM

## 2023-03-07 NOTE — TOC Progression Note (Signed)
Transition of Care [facility]) - Progression Note    Patient Details  Name: Tony Williams MRN: [id number] Date of Birth: 1945/07/10  [facility]) CM/SW Contact  Truddie Hidden, RN Phone Number: 03/07/2023, 2:18 PM  Clinical Narrative:    Per Vesta Mixer denied. Peer to peer offered to MD @ [phone], option 5 at 8:19am. Patient contact information provided. MD completed peer to peer, however patient is not medically stable to discharge per MD. Patient daughter notified.         Expected Discharge Plan and Services                                               Social Determinants of Health (SDOH) Interventions SDOH Screenings   Food Insecurity: No Food Insecurity (02/15/2023)  Housing: Low Risk  (02/24/2023)  Transportation Needs: No Transportation Needs (02/18/2023)  Utilities: Not At Risk (02/15/2023)  Alcohol Screen: Low Risk  (08/10/2022)  Depression (PHQ2-9): Low Risk  (08/10/2022)  Financial Resource Strain: Low Risk  (02/18/2023)  Physical Activity: Inactive (05/19/2021)  Social Connections: Unknown (05/19/2021)  Stress: No Stress Concern Present (05/19/2021)  Recent Concern: Stress - Stress Concern Present (02/26/2021)  Tobacco Use: Medium Risk (02/18/2023)    Readmission Risk Interventions    02/15/2023    9:37 AM 12/11/2020   11:06 AM  Readmission Risk Prevention Plan  Transportation Screening Complete Complete  PCP or Specialist Appt within 5-7 Days  Complete  PCP or Specialist Appt within 3-5 Days Complete   Medication Review (RN CM)  Complete  HRI or Home Care Consult Complete   Social Work Consult for Recovery Care Planning/Counseling Complete   Palliative Care Screening Not Applicable   Medication Review Oceanographer) Not Complete   Med Review Comments patient will discuss medication upon discharge

## 2023-03-07 NOTE — Progress Notes (Signed)
OT Cancellation Note  Patient Details Name: Tony Williams MRN: [id number] DOB: Dec 11, 1945   Cancelled Treatment:    Reason Eval/Treat Not Completed: Patient at procedure or test/ unavailable. Pt having head CT. OT will await results and continue to follow.   [name] L. Caly Pellum, OTR/L  03/07/23, 3:26 PM

## 2023-03-07 NOTE — Progress Notes (Signed)
PT Cancellation Note  Patient Details Name: Tony Williams MRN: [id number] DOB: [birth year]   Cancelled Treatment:    Reason Eval/Treat Not Completed: Patient at procedure or test/unavailable (patient having head CT. PT will continue to follow.)  Donna Bernard, PT, MPT  [name] 03/07/2023, 2:56 PM

## 2023-03-07 NOTE — H&P (View-Only) (Signed)
Rounding Note    Patient Name: Tony Williams Date of Encounter: 03/07/2023  [facility] Cardiologist: Debbe Odea, MD   Subjective   Patient followed by heart failure team earlier in admission, last seen by cardiology 12/15. Cardiology was asked to reevaluate patient today due to new symptoms of chest pain. Patient reports he noticed a dull, achy pain in his left chest and left shoulder blade this morning ~0500 at rest with associated shortness of breath and lightheadedness. Unchanged by movement. He does note that he has been experiencing nausea and diarrhea for the past 3 days, thinks his symptoms may be related to gas. Denies palpitations and diaphoresis. He was given sublingual nitroglycerin x2 with little relief of symptoms. Was later given oxycodone which relieved symptoms completely. He is chest pain free at this time.   Inpatient Medications    Scheduled Meds:  allopurinol  100 mg Oral Daily   aspirin EC  81 mg Oral Daily   cholecalciferol  4,000 Units Oral Daily   docusate sodium  200 mg Oral BID   escitalopram  20 mg Oral Daily   ezetimibe  10 mg Oral Daily   ferrous sulfate  325 mg Oral Q breakfast   heparin injection (subcutaneous)  5,000 Units Subcutaneous Q8H   hydrALAZINE  10 mg Oral Q8H   isosorbide mononitrate  30 mg Oral Daily   lidocaine  2 patch Transdermal Q24H   loratadine  10 mg Oral Daily   mirtazapine  15 mg Oral QHS   ondansetron (ZOFRAN) IV  4 mg Intravenous Q6H   polyethylene glycol  17 g Oral Daily   pravastatin  40 mg Oral q1800   QUEtiapine  25 mg Oral QHS   sodium chloride flush  3 mL Intravenous Q12H   Continuous Infusions:  PRN Meds: acetaminophen, albuterol, dextromethorphan-guaiFENesin, lactulose, meclizine, nitroGLYCERIN, mouth rinse, oxyCODONE, polyvinyl alcohol   Vital Signs    Vitals:   03/07/23 0530 03/07/23 0536 03/07/23 0644 03/07/23 0734  BP: (!) 140/67 (!) 148/73 (!) 145/69 (!) 143/69  Pulse: 91 92  95   Resp:    20  Temp:    98.4 F (36.9 C)  TempSrc:    Oral  SpO2:    99%  Weight:      Height:        Intake/Output Summary (Last 24 hours) at 03/07/2023 0904 Last data filed at 03/07/2023 0359 Gross per 24 hour  Intake 3 ml  Output 650 ml  Net -647 ml      03/07/2023    3:58 AM 03/06/2023    5:47 AM 03/05/2023    5:00 AM  Last 3 Weights  Weight (lbs) 275 lb 9.2 oz 276 lb 3.8 oz 273 lb 13 oz  Weight (kg) 125 kg 125.3 kg 124.2 kg      ECG    Sinus rhythm, rate 94, with conduction delay and nonspecific ST changes in inferior and lateral leads, unchanged from previous EKG - Personally Reviewed  Physical Exam   GEN: No acute distress.   Neck: No JVD Cardiac: RRR, no murmurs, rubs, or gallops.  Respiratory: Diminished breath sounds bilaterally GI: Soft, nontender, non-distended  MS: No edema; No deformity. Neuro:  Nonfocal  Psych: Normal affect   Labs    High Sensitivity Troponin:   Recent Labs  Lab 02/14/23 2152 02/15/23 0205 02/15/23 0653 02/15/23 1058 02/15/23 1859  TROPONINIHS 550* 535* 541* 458* 314*     Chemistry Recent Labs  Lab 03/05/23 0326  03/06/23 0427 03/07/23 0434  NA 135 136 139  K 3.6 3.6 4.1  CL 94* 97* 101  CO2 30 29 26   GLUCOSE 132* 125* 138*  BUN 48* 46* 42*  CREATININE 2.68* 2.55* 2.19*  CALCIUM 9.1 8.6* 9.2  GFRNONAA 24* 25* 30*  ANIONGAP 11 10 12     Lipids No results for input(s): "CHOL", "TRIG", "HDL", "LABVLDL", "LDLCALC", "CHOLHDL" in the last 168 hours.  Hematology Recent Labs  Lab 03/05/23 0326 03/06/23 0427 03/07/23 0434  WBC 9.1 8.7 9.1  RBC 3.08* 3.00* 3.22*  HGB 8.8* 8.5* 9.0*  HCT 28.4* 27.0* 29.1*  MCV 92.2 90.0 90.4  MCH 28.6 28.3 28.0  MCHC 31.0 31.5 30.9  RDW 16.6* 16.8* 17.0*  PLT 340 321 336   Thyroid No results for input(s): "TSH", "FREET4" in the last 168 hours.  BNPNo results for input(s): "BNP", "PROBNP" in the last 168 hours.  DDimer No results for input(s): "DDIMER" in the last 168 hours.    Radiology    No results found.  Cardiac Studies   02/15/23 Echocardiogram 1. Left ventricular ejection fraction, by estimation, is 30 to 35%. The  left ventricle has moderately decreased function. Left ventricular  endocardial border not optimally defined to evaluate regional wall motion.  The left ventricular internal cavity  size was mildly to moderately dilated. There is mild left ventricular  hypertrophy. Left ventricular diastolic parameters are consistent with  Grade II diastolic dysfunction (pseudonormalization). Elevated left atrial  pressure.   2. Right ventricular systolic function was not well visualized. The right  ventricular size is not well visualized.   3. Left atrial size was mildly dilated.   4. Right atrial size was moderately dilated.   5. The mitral valve was not well visualized. No evidence of mitral valve  regurgitation. No evidence of mitral stenosis.   6. The aortic valve has an indeterminant number of cusps. Aortic valve  regurgitation is trivial.   7. Pulmonic valve regurgitation not well-assessed.   10/29/21 Echocardiogram 1. Left ventricular ejection fraction, by estimation, is 50 to 55%. The  left ventricle has low normal function. Select images concerning for  inferior wall hypokinesis. Left ventricular diastolic parameters are  consistent with Grade I diastolic  dysfunction (impaired relaxation).   2. Right ventricular systolic function is normal. The right ventricular  size is normal.   3. Left atrial size was mildly dilated.   4. The mitral valve was not well visualized. Mild mitral valve  regurgitation. No evidence of mitral stenosis.   5. The aortic valve was not well visualized. Aortic valve regurgitation  is mild. No aortic stenosis is present.   6. The inferior vena cava is normal in size with greater than 50%  respiratory variability, suggesting right atrial pressure of 3 mmHg.    Patient Profile     77 y.o. male with history of  CAD/CABG x 42007, HFrEF EF 35%, hypertension, COPD, CKD presenting with shortness of breath being seen for acute CHF exacerbation.  Hospital course complicated by urinary tract obstruction, hematuria. Cardiology was asked to reevaluate due to new symptom of chest pain.  Assessment & Plan   Chest pain CAD  - Patient reports dull, achy pain in his left chest and left shoulder blade with associated shortness of breath and lightheadedness this morning 12/23 ~0500. Little relief with sublingual nitroglycerin x2. Full relief with oxycodone.  - EKG unchanged from previous - Troponin 192, improved from prior (peaked at 609 on 12/2) - Chest pain  free at this time - Patient is not a candidate for catheterization due to comorbidities - Continue aspirin 81 mg, ezetimibe 10 mg, Imdur 30 mg, and pravastatin 40 mg  Acute on chronic CHF - Echo this admission shows LVEF 30-35% - Torsemide held due to uptrending Cr - No BB due to bradycardia - Continue Imdur 30 mg, hydralazine 10 mg q8hr  For questions or updates, please contact [facility] Please consult [email or web address] for contact info under   Signed, Orion Crook, PA-C  03/07/2023, 9:04 AM

## 2023-03-07 NOTE — Assessment & Plan Note (Signed)
Chest pain with elevated troponin.  This morning with flipped T waves laterally and inferiorly.  Troponin elevated at 192 and second troponin elevated to 580.  Cardiology consultation.  With his chronic kidney disease not a good candidate for cardiac catheterization.  On pravastatin, Imdur, aspirin.

## 2023-03-08 ENCOUNTER — Encounter
Admission: EM | Disposition: A | Discharge status: Home / Self Care | Payer: Self-pay | Source: Home / Self Care | Attending: Internal Medicine

## 2023-03-08 DIAGNOSIS — J9621 Acute and chronic respiratory failure with hypoxia: Secondary | ICD-10-CM | POA: Diagnosis not present

## 2023-03-08 DIAGNOSIS — I5023 Acute on chronic systolic (congestive) heart failure: Secondary | ICD-10-CM | POA: Diagnosis not present

## 2023-03-08 DIAGNOSIS — I214 Non-ST elevation (NSTEMI) myocardial infarction: Secondary | ICD-10-CM | POA: Diagnosis not present

## 2023-03-08 DIAGNOSIS — N189 Chronic kidney disease, unspecified: Secondary | ICD-10-CM | POA: Diagnosis not present

## 2023-03-08 DIAGNOSIS — N179 Acute kidney failure, unspecified: Secondary | ICD-10-CM | POA: Diagnosis not present

## 2023-03-08 DIAGNOSIS — I2510 Atherosclerotic heart disease of native coronary artery without angina pectoris: Secondary | ICD-10-CM | POA: Diagnosis not present

## 2023-03-08 HISTORY — PX: LEFT HEART CATH AND CORONARY ANGIOGRAPHY: CATH118249

## 2023-03-08 LAB — TROPONIN I (HIGH SENSITIVITY): Troponin I (High Sensitivity): 5785 ng/L (ref <18)

## 2023-03-08 LAB — BASIC METABOLIC PANEL
Anion gap: 11 (ref 5–15)
BUN: 38 mg/dL — ABNORMAL HIGH (ref 8–23)
CO2: 27 mmol/L (ref 22–32)
Calcium: 8.8 mg/dL — ABNORMAL LOW (ref 8.9–10.3)
Chloride: 95 mmol/L — ABNORMAL LOW (ref 98–111)
Creatinine, Ser: 2 mg/dL — ABNORMAL HIGH (ref 0.61–1.24)
GFR, Estimated: 34 mL/min — ABNORMAL LOW (ref >60)
Glucose, Bld: 130 mg/dL — ABNORMAL HIGH (ref 70–99)
Potassium: 3.4 mmol/L — ABNORMAL LOW (ref 3.5–5.1)
Sodium: 133 mmol/L — ABNORMAL LOW (ref 135–145)

## 2023-03-08 SURGERY — LEFT HEART CATH AND CORONARY ANGIOGRAPHY
Anesthesia: Moderate Sedation

## 2023-03-08 MED ORDER — MIDAZOLAM HCL 2 MG/2ML IJ SOLN
INTRAMUSCULAR | Status: AC
  Filled 2023-03-08: qty 2

## 2023-03-08 MED ORDER — SODIUM CHLORIDE 0.9% FLUSH: 3.0000 mL | INTRAVENOUS | Status: DC | PRN

## 2023-03-08 MED ORDER — HEPARIN (PORCINE) IN NACL 1000-0.9 UT/500ML-% IV SOLN
INTRAVENOUS | Status: DC | PRN
  Administered 2023-03-08 (×2): 500 mL

## 2023-03-08 MED ORDER — SODIUM CHLORIDE 0.9 % IV SOLN: INTRAVENOUS | Status: DC

## 2023-03-08 MED ORDER — ONDANSETRON HCL 4 MG/2ML IJ SOLN: 4.0000 mg | Freq: Four times a day (QID) | INTRAMUSCULAR | Status: DC | PRN

## 2023-03-08 MED ORDER — FENTANYL CITRATE (PF) 100 MCG/2ML IJ SOLN
INTRAMUSCULAR | Status: DC | PRN
  Administered 2023-03-08 (×3): 25 ug via INTRAVENOUS

## 2023-03-08 MED ORDER — PROMETHAZINE (PHENERGAN) 6.25MG IN NS 50ML IVPB
6.2500 mg | Freq: Once | INTRAVENOUS | Status: AC
  Administered 2023-03-08: 6.25 mg via INTRAVENOUS
  Filled 2023-03-08: qty 0.25

## 2023-03-08 MED ORDER — ASPIRIN 81 MG PO CHEW: 81.0000 mg | CHEWABLE_TABLET | ORAL | Status: DC

## 2023-03-08 MED ORDER — SODIUM CHLORIDE 0.9% FLUSH
3.0000 mL | Freq: Two times a day (BID) | INTRAVENOUS | Status: DC
  Administered 2023-03-09 – 2023-03-14 (×11): 3 mL via INTRAVENOUS

## 2023-03-08 MED ORDER — SODIUM CHLORIDE 0.9 % IV SOLN: INTRAVENOUS | Status: AC

## 2023-03-08 MED ORDER — FENTANYL CITRATE (PF) 100 MCG/2ML IJ SOLN
INTRAMUSCULAR | Status: AC
  Filled 2023-03-08: qty 2

## 2023-03-08 MED ORDER — METOPROLOL SUCCINATE ER 25 MG PO TB24
25.0000 mg | ORAL_TABLET | Freq: Every day | ORAL | Status: DC
  Administered 2023-03-08 – 2023-03-15 (×7): 25 mg via ORAL
  Filled 2023-03-08 (×7): qty 1

## 2023-03-08 MED ORDER — SODIUM CHLORIDE 0.9 % IV SOLN: 250.0000 mL | INTRAVENOUS | Status: AC | PRN

## 2023-03-08 MED ORDER — PROMETHAZINE (PHENERGAN) 6.25MG IN NS 50ML IVPB
6.2500 mg | Freq: Four times a day (QID) | INTRAVENOUS | Status: DC | PRN
  Administered 2023-03-08 – 2023-03-09 (×2): 6.25 mg via INTRAVENOUS
  Filled 2023-03-08: qty 6.25
  Filled 2023-03-08: qty 50

## 2023-03-08 MED ORDER — LIDOCAINE HCL (PF) 1 % IJ SOLN
INTRAMUSCULAR | Status: DC | PRN
  Administered 2023-03-08: 5 mL

## 2023-03-08 MED ORDER — IOHEXOL 300 MG/ML  SOLN
INTRAMUSCULAR | Status: DC | PRN
  Administered 2023-03-08: 80 mL

## 2023-03-08 MED ORDER — MIDAZOLAM HCL 2 MG/2ML IJ SOLN
INTRAMUSCULAR | Status: DC | PRN
  Administered 2023-03-08: 1 mg via INTRAVENOUS

## 2023-03-08 MED ORDER — SODIUM CHLORIDE 0.9 % IV SOLN
INTRAVENOUS | Status: DC | PRN
  Administered 2023-03-08: 50 mL/h via INTRAVENOUS

## 2023-03-08 SURGICAL SUPPLY — 14 items
CATH INFINITI 5 FR 3DRC (CATHETERS) IMPLANT
CATH INFINITI 5 FR IM (CATHETERS) IMPLANT
CATH INFINITI 5FR MULTPACK ANG (CATHETERS) IMPLANT
DEVICE CLOSURE MYNXGRIP 5F (Vascular Products) IMPLANT
DRAPE BRACHIAL (DRAPES) IMPLANT
KIT MICROPUNCTURE NIT STIFF (SHEATH) IMPLANT
PACK CARDIAC CATH (CUSTOM PROCEDURE TRAY) ×1 IMPLANT
PANNUS RETENTION SYSTEM 2 PAD (MISCELLANEOUS) IMPLANT
PROTECTION STATION PRESSURIZED (MISCELLANEOUS) ×1 IMPLANT
SET ATX-X65L (MISCELLANEOUS) IMPLANT
SHEATH AVANTI 5FR X 11CM (SHEATH) IMPLANT
STATION PROTECTION PRESSURIZED (MISCELLANEOUS) IMPLANT
WIRE EMERALD 3MM-J .035X260CM (WIRE) IMPLANT
WIRE GUIDERIGHT .035X150 (WIRE) IMPLANT

## 2023-03-08 NOTE — Progress Notes (Signed)
Progress Note   Patient: Tony Williams JXB:[id number] DOB: 30-Jun-1945 DOA: 02/14/2023     22 DOS: the patient was seen and examined on 03/08/2023   Brief hospital course: 77 year old man past medical history of congestive heart failure, hyperlipidemia, hypertension, type 2 diabetes mellitus, COPD on 3 L of oxygen, CAD, gout, morbid obesity, CKD, depression and anxiety, obstructive sleep apnea presented with shortness of breath.  Patient was admitted with heart failure exacerbation.  Patient pulled out Foley catheter on 12/10 and had hematuria and clots.  Foley placed on 12/11.  Patient had suprapubic catheter placed on 12/14.  12/15.  Creatinine increased to 3.05.  Held torsemide and gentle IV fluids overnight.  Hemoglobin did drift down to 9.7.  Continue to watch closely. 12/16.  Creatinine down to 2.27.  Discontinue fluids.  Hemoglobin drifted down to 8.8.  Recheck creatinine tomorrow.  Recheck hemoglobin tomorrow.  Hold aspirin and heparin subcutaneous injection. 12/17.  Creatinine down to 2.15 and hemoglobin 8.7.  Notified by transitional care team the rehab that they were planning on going to is out of network with his insurance.  TOC to expand bed search.  As per Dr. Mayford Knife 12/18-12/22/24: Pt's Cr started to rise again so torsemide was held and started IVFs x 1 day and trending down slightly today. Of note, insurance Berkley Harvey was denied b/c pt was not able to participate w/ therapy b/c of dizziness but CM reapplied for insurance auth and that is pending currently.   12/23.  Patient complaining of nausea consistently for the past 3 days.  He has been able to eat but still feeling very nauseous.  Patient states he is claustrophobic and cannot get into MRI.  CT scan of the head negative.  Patient also complained of chest pain.  First troponin 192 and second troponin up at 580. 12/24.  Troponin went up to 5785.  Cardiac catheterization showed severe three-vessel disease.  Cardiology recommended  medical management.  Small dose of Toprol added.  Assessment and Plan: * NSTEMI (non-ST elevated myocardial infarction) [facility]) Cardiac catheterization showed triple-vessel disease.  Troponin rose to 5785.  Cardiology recommended medical management.  Low-dose Toprol added.  Patient already on aspirin, pravastatin and Imdur.  Acute on chronic systolic CHF (congestive heart failure) (HCC) Last echocardiogram shows an EF of 30 to 35%.  Patient on hydralazine, Imdur.  Low-dose beta-blocker started.  Torsemide on hold.  Acute kidney injury superimposed on CKD (HCC) AKI on CKD stage IIIb.  Creatinine has been variable during the hospital course.  Last creatinine 2.00.  Acute on chronic respiratory failure with hypoxia (HCC) Patient on 3 L this morning.  Drop in hemoglobin Acute blood loss anemia.  Patient did have a drop in hemoglobin during the hospital course but had hematuria especially after the suprapubic tube.  Last hemoglobin 9.0.  On oral iron.  Blurry vision, bilateral Discontinue Toviaz  Chronic obstructive pulmonary disease (HCC) Respiratory status stable  Type II diabetes mellitus with renal manifestations (HCC) Last hemoglobin A1c 6.8.  Patient on diet control.  Hyperlipidemia associated with type 2 diabetes mellitus (HCC) Currently on pravastatin  Depression with anxiety On Lexapro  OSA (obstructive sleep apnea) CPAP at night  Obesity, Class III, BMI 40-49.9 (morbid obesity) (HCC) BMI 39.76  Nausea Will give around-the-clock Zofran.  Get a CT scan of the head because he cannot do an MRI.  Acute delirium Improved with Seroquel        Subjective: Patient still very nauseous.  Added Phenergan.  With troponin  rising this morning cardiology decided to take for cardiac catheterization.  No chest pain this morning.  Physical Exam: Vitals:   03/08/23 1220 03/08/23 1230 03/08/23 1245 03/08/23 1300  BP: 131/80 122/77 114/74 130/72  Pulse: (!) 102 (!) 102 99 100   Resp: 18 18 16 15   Temp:      TempSrc:      SpO2: 97% 98% 98% 98%  Weight:      Height:       Physical Exam HENT:     Head: Normocephalic.     Mouth/Throat:     Pharynx: No oropharyngeal exudate.  Eyes:     General: Lids are normal.     Conjunctiva/sclera: Conjunctivae normal.  Cardiovascular:     Rate and Rhythm: Normal rate and regular rhythm.     Heart sounds: Normal heart sounds, S1 normal and S2 normal.  Pulmonary:     Breath sounds: Examination of the right-lower field reveals decreased breath sounds. Examination of the left-lower field reveals decreased breath sounds. Decreased breath sounds present. No wheezing, rhonchi or rales.  Abdominal:     Palpations: Abdomen is soft.     Tenderness: There is no abdominal tenderness.  Musculoskeletal:     Right lower leg: No swelling.     Left lower leg: No swelling.  Skin:    General: Skin is warm.     Findings: No rash.  Neurological:     Mental Status: He is alert.     Data Reviewed: Sodium 133, potassium 3.4, creatinine 2.0, troponin 5785  Family Communication: Spoke with family at the bedside  Disposition: Status is: Inpatient Remains inpatient appropriate because: Cardiac catheterization today  Planned Discharge Destination: Rehab    Time spent: 28 minutes  Author: Alford Highland, MD 03/08/2023 1:10 PM  For on call review [email or web address].

## 2023-03-08 NOTE — Assessment & Plan Note (Signed)
Currently on pravastatin. 

## 2023-03-08 NOTE — Interval H&P Note (Signed)
History and Physical Interval Note:  03/08/2023 11:15 AM  Tony Williams  has presented today for surgery, with the diagnosis of non ST elevation myocardial infarction.  The various methods of treatment have been discussed with the patient and family. After consideration of risks, benefits and other options for treatment, the patient has consented to  Procedure(s): LEFT HEART CATH AND CORONARY ANGIOGRAPHY (N/A) as a surgical intervention.  The patient's history has been reviewed, patient examined, no change in status, stable for surgery.  I have reviewed the patient's chart and labs.  Questions were answered to the patient's satisfaction.     Lorine Bears

## 2023-03-08 NOTE — Plan of Care (Signed)
  Problem: Fluid Volume: Goal: Ability to maintain a balanced intake and output will improve Outcome: Progressing   Problem: Metabolic: Goal: Ability to maintain appropriate glucose levels will improve Outcome: Progressing   Problem: Activity: Goal: Capacity to carry out activities will improve Outcome: Progressing   Problem: Clinical Measurements: Goal: Ability to maintain clinical measurements within normal limits will improve Outcome: Progressing   Problem: Clinical Measurements: Goal: Will remain free from infection Outcome: Progressing   Problem: Clinical Measurements: Goal: Diagnostic test results will improve Outcome: Progressing   Problem: Clinical Measurements: Goal: Respiratory complications will improve Outcome: Progressing   Problem: Clinical Measurements: Goal: Cardiovascular complication will be avoided Outcome: Progressing   Problem: Safety: Goal: Ability to remain free from injury will improve Outcome: Progressing   Problem: Pain Management: Goal: General experience of comfort will improve Outcome: Progressing   Problem: Skin Integrity: Goal: Risk for impaired skin integrity will decrease Outcome: Progressing

## 2023-03-08 NOTE — Progress Notes (Signed)
Rounding Note    Patient Name: Tony Williams Date of Encounter: 03/08/2023  [facility] Cardiologist: Debbe Odea, MD   Subjective   Patient reports continued symptoms of nausea and weakness worsening overnight. He denies further chest pain. Troponin peaked overnight at 5785. Plan for left heart catheterization today.   Inpatient Medications    Scheduled Meds:  allopurinol  100 mg Oral Daily   aspirin EC  81 mg Oral Daily   cholecalciferol  4,000 Units Oral Daily   docusate sodium  200 mg Oral BID   escitalopram  20 mg Oral Daily   ezetimibe  10 mg Oral Daily   ferrous sulfate  325 mg Oral Q breakfast   heparin injection (subcutaneous)  5,000 Units Subcutaneous Q8H   hydrALAZINE  10 mg Oral Q8H   isosorbide mononitrate  60 mg Oral Daily   lidocaine  2 patch Transdermal Q24H   loratadine  10 mg Oral Daily   mirtazapine  15 mg Oral QHS   ondansetron (ZOFRAN) IV  4 mg Intravenous Q6H   polyethylene glycol  17 g Oral Daily   pravastatin  40 mg Oral q1800   QUEtiapine  25 mg Oral QHS   sodium chloride flush  3 mL Intravenous Q12H   Continuous Infusions:  PRN Meds: acetaminophen, albuterol, dextromethorphan-guaiFENesin, lactulose, meclizine, nitroGLYCERIN, mouth rinse, oxyCODONE, polyvinyl alcohol   Vital Signs    Vitals:   03/07/23 1952 03/07/23 2334 03/08/23 0500 03/08/23 0500  BP: 139/70 (!) 142/70  139/71  Pulse: 85 82  84  Resp: 18 18  18   Temp: 97.8 F (36.6 C) 97.9 F (36.6 C)  98.2 F (36.8 C)  TempSrc:  Oral  Oral  SpO2: 100% 100%  99%  Weight:   125.7 kg   Height:        Intake/Output Summary (Last 24 hours) at 03/08/2023 0807 Last data filed at 03/08/2023 0503 Gross per 24 hour  Intake 3 ml  Output 1050 ml  Net -1047 ml      03/08/2023    5:00 AM 03/07/2023    3:58 AM 03/06/2023    5:47 AM  Last 3 Weights  Weight (lbs) 277 lb 1.9 oz 275 lb 9.2 oz 276 lb 3.8 oz  Weight (kg) 125.7 kg 125 kg 125.3 kg      Telemetry     Sinus rhythm rate 80-90 bpm - Personally Reviewed  Physical Exam   GEN: No acute distress.   Neck: JVD not well visualized Cardiac: RRR, no murmurs, rubs, or gallops.  Respiratory: Lung sounds diminished bilaterally. GI: Soft, nontender, non-distended  MS: Trace LE edema; No deformity. Neuro:  Nonfocal  Psych: Normal affect   Labs    High Sensitivity Troponin:   Recent Labs  Lab 02/15/23 1058 02/15/23 1859 03/07/23 0901 03/07/23 1044 03/08/23 0422  TROPONINIHS 458* 314* 192* 580* 5,785*     Chemistry Recent Labs  Lab 03/06/23 0427 03/07/23 0434 03/08/23 0422  NA 136 139 133*  K 3.6 4.1 3.4*  CL 97* 101 95*  CO2 29 26 27   GLUCOSE 125* 138* 130*  BUN 46* 42* 38*  CREATININE 2.55* 2.19* 2.00*  CALCIUM 8.6* 9.2 8.8*  GFRNONAA 25* 30* 34*  ANIONGAP 10 12 11     Lipids No results for input(s): "CHOL", "TRIG", "HDL", "LABVLDL", "LDLCALC", "CHOLHDL" in the last 168 hours.  Hematology Recent Labs  Lab 03/05/23 0326 03/06/23 0427 03/07/23 0434  WBC 9.1 8.7 9.1  RBC 3.08* 3.00* 3.22*  HGB 8.8* 8.5* 9.0*  HCT 28.4* 27.0* 29.1*  MCV 92.2 90.0 90.4  MCH 28.6 28.3 28.0  MCHC 31.0 31.5 30.9  RDW 16.6* 16.8* 17.0*  PLT 340 321 336   Thyroid No results for input(s): "TSH", "FREET4" in the last 168 hours.  BNPNo results for input(s): "BNP", "PROBNP" in the last 168 hours.  DDimer No results for input(s): "DDIMER" in the last 168 hours.   Radiology    CT HEAD WO CONTRAST ( ) Result Date: 03/07/2023 CLINICAL DATA:  Vertigo, central EXAM: CT HEAD WITHOUT CONTRAST TECHNIQUE: Contiguous axial images were obtained from the base of the skull through the vertex without intravenous contrast. RADIATION DOSE REDUCTION: This exam was performed according to the departmental dose-optimization program which includes automated exposure control, adjustment of the mA and/or kV according to patient size and/or use of iterative reconstruction technique. COMPARISON:  CT Head 11/10/21  FINDINGS: Brain: No hemorrhage. No hydrocephalus. No extra-axial fluid collection. No CT evidence of an acute cortical infarct. No mass effect. No mass lesion. Encephalomalacia in the inferior medial left frontal lobe. This may be posttraumatic or related to a chronic infarct. Vascular: No hyperdense vessel or unexpected calcification. Skull: Normal. Negative for fracture or focal lesion. Sinuses/Orbits: No middle ear or mastoid effusion. Paranasal sinuses are clear. Bilateral lens replacement. Orbits are otherwise unremarkable. Other: None. IMPRESSION: No hemorrhage or CT evidence of an acute cortical infarct. Electronically Signed   By: Lorenza Cambridge M.D.   On: 03/07/2023 14:55    Cardiac Studies   02/15/23 Echocardiogram 1. Left ventricular ejection fraction, by estimation, is 30 to 35%. The  left ventricle has moderately decreased function. Left ventricular  endocardial border not optimally defined to evaluate regional wall motion.  The left ventricular internal cavity  size was mildly to moderately dilated. There is mild left ventricular  hypertrophy. Left ventricular diastolic parameters are consistent with  Grade II diastolic dysfunction (pseudonormalization). Elevated left atrial  pressure.   2. Right ventricular systolic function was not well visualized. The right  ventricular size is not well visualized.   3. Left atrial size was mildly dilated.   4. Right atrial size was moderately dilated.   5. The mitral valve was not well visualized. No evidence of mitral valve  regurgitation. No evidence of mitral stenosis.   6. The aortic valve has an indeterminant number of cusps. Aortic valve  regurgitation is trivial.   7. Pulmonic valve regurgitation not well-assessed.    10/29/21 Echocardiogram 1. Left ventricular ejection fraction, by estimation, is 50 to 55%. The  left ventricle has low normal function. Select images concerning for  inferior wall hypokinesis. Left ventricular diastolic  parameters are  consistent with Grade I diastolic  dysfunction (impaired relaxation).   2. Right ventricular systolic function is normal. The right ventricular  size is normal.   3. Left atrial size was mildly dilated.   4. The mitral valve was not well visualized. Mild mitral valve  regurgitation. No evidence of mitral stenosis.   5. The aortic valve was not well visualized. Aortic valve regurgitation  is mild. No aortic stenosis is present.   6. The inferior vena cava is normal in size with greater than 50%  respiratory variability, suggesting right atrial pressure of 3 mmHg.  Patient Profile     77 y.o. male with history of CAD/CABG x 42007, HFrEF EF 35%, hypertension, COPD, CKD presenting with shortness of breath being seen for acute CHF exacerbation.  Hospital course complicated by urinary tract  obstruction, hematuria. Cardiology was asked to reevaluate due to new symptom of chest pain.  Assessment & Plan    NSTEMI CAD - Patient experienced dull, achy pain in his left chest and left shoulder blade with associated shortness of breath and lightheadedness 12/23 ~0500. Little relief with sublingual nitroglycerin x2. Full relief with oxycodone.  - Patient reports worsening symptoms of nausea and weakness overnight, denies further chest pain - EKG unchanged from previous - Creatinine 2.00, seems near baseline for patient - Troponin peaked at 5785 on 12/24 - Continue aspirin 81 mg, ezetimibe 10 mg, Imdur 60 mg (increased yesterday 12/23), and pravastatin 40 mg - Plan for cardiac catheterization today, discussed in detail the risks associated in the setting of CKD  Informed Consent   Shared Decision Making/Informed Consent The risks [stroke (1 in 1000), death (1 in 1000), kidney failure [usually temporary] (1 in 500), bleeding (1 in 200), allergic reaction [possibly serious] (1 in 200)], benefits (diagnostic support and management of coronary artery disease) and alternatives of a cardiac  catheterization were discussed in detail with Tony Williams and he is willing to proceed.    Acute on chronic CHF - Echo this admission shows LVEF 30-35% - Torsemide held due to uptrending Cr - No BB due to bradycardia - Continue Imdur 60 mg (increased yesterday 12/23), hydralazine 10 mg q8hr  For questions or updates, please contact [facility] Please consult [email or web address] for contact info under        Signed, Orion Crook, PA-C  03/08/2023, 8:07 AM

## 2023-03-08 NOTE — Progress Notes (Signed)
PT Cancellation Note  Patient Details Name: Tony Williams MRN: [id number] DOB: 1945/10/15   Cancelled Treatment:    Reason Eval/Treat Not Completed: Patient at procedure or test/unavailable, will attempt to see pt at a future date/time as medically appropriate.    Ovidio Hanger PT, DPT 03/08/23, 12:49 PM

## 2023-03-08 NOTE — Assessment & Plan Note (Signed)
Cardiac catheterization showed triple-vessel disease.  Troponin rose to 5785.  Cardiology recommended medical management.  Patient on aspirin, pravastatin, low-dose Toprol and Imdur.

## 2023-03-08 NOTE — Progress Notes (Signed)
OT Cancellation Note  Patient Details Name: Tony Williams MRN: [id number] DOB: [birth year]   Cancelled Treatment:    Reason Eval/Treat Not Completed: Patient at procedure or test/ unavailable. Chart reviewed, pt currently off the floor for a procedure. Will re-attempt as available.   Kathie Dike, M.S. OTR/L  03/08/23, 11:29 AM  ascom [phone]

## 2023-03-09 DIAGNOSIS — I5023 Acute on chronic systolic (congestive) heart failure: Secondary | ICD-10-CM | POA: Diagnosis not present

## 2023-03-09 DIAGNOSIS — I214 Non-ST elevation (NSTEMI) myocardial infarction: Secondary | ICD-10-CM | POA: Diagnosis not present

## 2023-03-09 DIAGNOSIS — J9621 Acute and chronic respiratory failure with hypoxia: Secondary | ICD-10-CM | POA: Diagnosis not present

## 2023-03-09 DIAGNOSIS — N179 Acute kidney failure, unspecified: Secondary | ICD-10-CM | POA: Diagnosis not present

## 2023-03-09 LAB — BASIC METABOLIC PANEL
Anion gap: 11 (ref 5–15)
BUN: 35 mg/dL — ABNORMAL HIGH (ref 8–23)
CO2: 26 mmol/L (ref 22–32)
Calcium: 9 mg/dL (ref 8.9–10.3)
Chloride: 97 mmol/L — ABNORMAL LOW (ref 98–111)
Creatinine, Ser: 1.95 mg/dL — ABNORMAL HIGH (ref 0.61–1.24)
GFR, Estimated: 35 mL/min — ABNORMAL LOW (ref >60)
Glucose, Bld: 144 mg/dL — ABNORMAL HIGH (ref 70–99)
Potassium: 4 mmol/L (ref 3.5–5.1)
Sodium: 134 mmol/L — ABNORMAL LOW (ref 135–145)

## 2023-03-09 MED ORDER — SODIUM CHLORIDE 0.9 % IV SOLN
12.5000 mg | Freq: Four times a day (QID) | INTRAVENOUS | Status: DC | PRN
  Administered 2023-03-10 – 2023-03-11 (×2): 12.5 mg via INTRAVENOUS
  Filled 2023-03-09 (×2): qty 12.5

## 2023-03-09 MED ORDER — SODIUM CHLORIDE 0.9 % IV SOLN: 12.5000 mg | Freq: Four times a day (QID) | INTRAVENOUS | Status: DC | PRN

## 2023-03-09 NOTE — Progress Notes (Signed)
Progress Note   Patient: Tony Williams ZOX:[id number] DOB: [birth year] DOA: 02/14/2023     23 DOS: the patient was seen and examined on 03/09/2023   Brief hospital course: 77 year old man past medical history of congestive heart failure, hyperlipidemia, hypertension, type 2 diabetes mellitus, COPD on 3 L of oxygen, CAD, gout, morbid obesity, CKD, depression and anxiety, obstructive sleep apnea presented with shortness of breath.  Patient was admitted with heart failure exacerbation.  Patient pulled out Foley catheter on 12/10 and had hematuria and clots.  Foley placed on 12/11.  Patient had suprapubic catheter placed on 12/14.  12/15.  Creatinine increased to 3.05.  Held torsemide and gentle IV fluids overnight.  Hemoglobin did drift down to 9.7.  Continue to watch closely. 12/16.  Creatinine down to 2.27.  Discontinue fluids.  Hemoglobin drifted down to 8.8.  Recheck creatinine tomorrow.  Recheck hemoglobin tomorrow.  Hold aspirin and heparin subcutaneous injection. 12/17.  Creatinine down to 2.15 and hemoglobin 8.7.  Notified by transitional care team the rehab that they were planning on going to is out of network with his insurance.  TOC to expand bed search.  As per Dr. Mayford Knife 12/18-12/22/24: Pt's Cr started to rise again so torsemide was held and started IVFs x 1 day and trending down slightly today. Of note, insurance Berkley Harvey was denied b/c pt was not able to participate w/ therapy b/c of dizziness but CM reapplied for insurance auth and that is pending currently.   12/23.  Patient complaining of nausea consistently for the past 3 days.  He has been able to eat but still feeling very nauseous.  Patient states he is claustrophobic and cannot get into MRI.  CT scan of the head negative.  Patient also complained of chest pain.  First troponin 192 and second troponin up at 580. 12/24.  Troponin went up to 5785.  Cardiac catheterization showed severe three-vessel disease.  Cardiology recommended  medical management.  Small dose of Toprol added. 12/25.  Nausea a little bit improved with the Phenergan rather than the Zofran.  Assessment and Plan: * NSTEMI (non-ST elevated myocardial infarction) [facility]) Cardiac catheterization showed triple-vessel disease.  Troponin rose to 5785.  Cardiology recommended medical management.  Patient on aspirin, pravastatin, low-dose Toprol and Imdur.  Acute on chronic systolic CHF (congestive heart failure) (HCC) Last echocardiogram shows an EF of 30 to 35%.  Patient on hydralazine, Imdur.  Low-dose beta-blocker started.  Torsemide on hold.  Acute kidney injury superimposed on CKD (HCC) AKI on CKD stage IIIb.  Creatinine has been variable during the hospital course.  Last creatinine 1.95.  Acute on chronic respiratory failure with hypoxia (HCC) Patient on 3 L this morning.  Drop in hemoglobin Acute blood loss anemia.  Patient did have a drop in hemoglobin during the hospital course but had hematuria especially after the suprapubic tube.  Last hemoglobin 9.0.  On oral iron.  Chronic obstructive pulmonary disease (HCC) Respiratory status stable  Type II diabetes mellitus with renal manifestations (HCC) Last hemoglobin A1c 6.8.  Patient on diet control.  Hyperlipidemia associated with type 2 diabetes mellitus (HCC) Currently on pravastatin  Blurry vision, bilateral Discontinue Toviaz  Depression with anxiety On Lexapro  OSA (obstructive sleep apnea) CPAP at night  Obesity, Class III, BMI 40-49.9 (morbid obesity) (HCC) BMI 39.73  Nausea Discontinue around-the-clock Zofran since not helping.  Continue as needed Phenergan.  Acute delirium Improved with Seroquel        Subjective: Patient feeling a little bit better  with the Phenergan for the nausea than with the Zofran.  No chest pain.  Was able to eat a little bit last night.  Initially admitted with shortness of breath.  Physical Exam: Vitals:   03/09/23 0344 03/09/23 0500  03/09/23 0817 03/09/23 1204  BP: 136/64  127/68 (!) 119/56  Pulse: 87  87 84  Resp: 18     Temp: 98 F (36.7 C)  98.2 F (36.8 C) 98.4 F (36.9 C)  TempSrc:    Oral  SpO2: 99%  99% 98%  Weight:  125.6 kg    Height:       Physical Exam HENT:     Head: Normocephalic.  Eyes:     General: Lids are normal.     Conjunctiva/sclera: Conjunctivae normal.  Cardiovascular:     Rate and Rhythm: Normal rate and regular rhythm.     Heart sounds: Normal heart sounds, S1 normal and S2 normal.  Pulmonary:     Breath sounds: Examination of the right-lower field reveals decreased breath sounds. Examination of the left-lower field reveals decreased breath sounds. Decreased breath sounds present. No wheezing, rhonchi or rales.  Abdominal:     Palpations: Abdomen is soft.     Tenderness: There is no abdominal tenderness.  Musculoskeletal:     Right lower leg: No swelling.     Left lower leg: No swelling.  Skin:    General: Skin is warm.     Findings: No rash.  Neurological:     Mental Status: He is alert.     Data Reviewed: Creatinine 1.95, sodium 134  Family Communication: Family at bedside  Disposition: Status is: Inpatient Remains inpatient appropriate because: Will need insurance authorization for rehab  Planned Discharge Destination: Rehab    Time spent: 28 minutes  Author: Alford Highland, MD 03/09/2023 12:40 PM  For on call review [email or web address].

## 2023-03-09 NOTE — Plan of Care (Signed)
  Problem: Coping: Goal: Ability to adjust to condition or change in health will improve Outcome: Progressing   Problem: Fluid Volume: Goal: Ability to maintain a balanced intake and output will improve Outcome: Progressing   Problem: Health Behavior/Discharge Planning: Goal: Ability to identify and utilize available resources and services will improve Outcome: Progressing Goal: Ability to manage health-related needs will improve Outcome: Progressing   Problem: Metabolic: Goal: Ability to maintain appropriate glucose levels will improve Outcome: Progressing   Problem: Nutritional: Goal: Maintenance of adequate nutrition will improve Outcome: Progressing Goal: Progress toward achieving an optimal weight will improve Outcome: Progressing   Problem: Skin Integrity: Goal: Risk for impaired skin integrity will decrease Outcome: Progressing   Problem: Tissue Perfusion: Goal: Adequacy of tissue perfusion will improve Outcome: Progressing   Problem: Education: Goal: Ability to demonstrate management of disease process will improve Outcome: Progressing Goal: Ability to verbalize understanding of medication therapies will improve Outcome: Progressing   Problem: Activity: Goal: Capacity to carry out activities will improve Outcome: Progressing   Problem: Cardiac: Goal: Ability to achieve and maintain adequate cardiopulmonary perfusion will improve Outcome: Progressing   Problem: Education: Goal: Knowledge of General Education information will improve Description: Including pain rating scale, medication(s)/side effects and non-pharmacologic comfort measures Outcome: Progressing   Problem: Health Behavior/Discharge Planning: Goal: Ability to manage health-related needs will improve Outcome: Progressing   Problem: Clinical Measurements: Goal: Ability to maintain clinical measurements within normal limits will improve Outcome: Progressing Goal: Will remain free from  infection Outcome: Progressing Goal: Diagnostic test results will improve Outcome: Progressing Goal: Respiratory complications will improve Outcome: Progressing Goal: Cardiovascular complication will be avoided Outcome: Progressing   Problem: Activity: Goal: Risk for activity intolerance will decrease Outcome: Progressing   Problem: Nutrition: Goal: Adequate nutrition will be maintained Outcome: Progressing   Problem: Coping: Goal: Level of anxiety will decrease Outcome: Progressing   Problem: Elimination: Goal: Will not experience complications related to bowel motility Outcome: Progressing Goal: Will not experience complications related to urinary retention Outcome: Progressing   Problem: Pain Management: Goal: General experience of comfort will improve Outcome: Progressing   Problem: Safety: Goal: Ability to remain free from injury will improve Outcome: Progressing   Problem: Skin Integrity: Goal: Risk for impaired skin integrity will decrease Outcome: Progressing   Problem: Education: Goal: Understanding of CV disease, CV risk reduction, and recovery process will improve Outcome: Progressing Goal: Individualized Educational Video(s) Outcome: Progressing   Problem: Activity: Goal: Ability to return to baseline activity level will improve Outcome: Progressing   Problem: Cardiovascular: Goal: Ability to achieve and maintain adequate cardiovascular perfusion will improve Outcome: Progressing Goal: Vascular access site(s) Level 0-1 will be maintained Outcome: Progressing   Problem: Health Behavior/Discharge Planning: Goal: Ability to safely manage health-related needs after discharge will improve Outcome: Progressing

## 2023-03-09 NOTE — Progress Notes (Signed)
Rounding Note    Patient Name: Tony Williams Date of Encounter: 03/09/2023  [facility] Cardiologist: Debbe Odea, MD   Subjective   [facility] 12/24 showed severe underlying 3-vessel CAD with patent LIMA to diagonal and SVG to LAD with patent stents in the graft. Chronically occluded SVG to OM. The native RCA was patent previously, though diffusely diseased. It was now supplied by left-sided collaterals. It was suspected the RCA was the culprit for the MI. The vessel was severely diseased before and is not suitable for PCI now. There was also severe stenosis in the left main artery supplying mainly few septal branches. Thus, risks of PCI outweighed the benefit here. Recommend medical therapy as there are no revascularization options to the chronically occluded left circumflex and right coronary arteries.   No chest pain or dyspnea. Breathing at baseline on supplemental ocygen via nasal cannula at 3 L (baseline). Post cath vitals and renal function stable.   Inpatient Medications    Scheduled Meds:  allopurinol  100 mg Oral Daily   aspirin EC  81 mg Oral Daily   cholecalciferol  4,000 Units Oral Daily   docusate sodium  200 mg Oral BID   escitalopram  20 mg Oral Daily   ezetimibe  10 mg Oral Daily   ferrous sulfate  325 mg Oral Q breakfast   heparin injection (subcutaneous)  5,000 Units Subcutaneous Q8H   hydrALAZINE  10 mg Oral Q8H   isosorbide mononitrate  60 mg Oral Daily   lidocaine  2 patch Transdermal Q24H   loratadine  10 mg Oral Daily   metoprolol succinate  25 mg Oral Daily   mirtazapine  15 mg Oral QHS   ondansetron (ZOFRAN) IV  4 mg Intravenous Q6H   polyethylene glycol  17 g Oral Daily   pravastatin  40 mg Oral q1800   QUEtiapine  25 mg Oral QHS   sodium chloride flush  3 mL Intravenous Q12H   sodium chloride flush  3 mL Intravenous Q12H   Continuous Infusions:  sodium chloride     promethazine (PHENERGAN) injection (IM or IVPB) Stopped (03/09/23  0550)   PRN Meds: sodium chloride, acetaminophen, albuterol, dextromethorphan-guaiFENesin, lactulose, meclizine, nitroGLYCERIN, ondansetron (ZOFRAN) IV, mouth rinse, oxyCODONE, polyvinyl alcohol, promethazine (PHENERGAN) injection (IM or IVPB), sodium chloride flush   Vital Signs    Vitals:   03/08/23 2311 03/09/23 0344 03/09/23 0500 03/09/23 0817  BP: 118/60 136/64  127/68  Pulse: 79 87  87  Resp: 18 18    Temp: 98 F (36.7 C) 98 F (36.7 C)  98.2 F (36.8 C)  TempSrc:      SpO2: 100% 99%  99%  Weight:   125.6 kg   Height:        Intake/Output Summary (Last 24 hours) at 03/09/2023 0906 Last data filed at 03/09/2023 0601 Gross per 24 hour  Intake 408 ml  Output 1000 ml  Net -592 ml      03/09/2023    5:00 AM 03/08/2023    5:00 AM 03/07/2023    3:58 AM  Last 3 Weights  Weight (lbs) 276 lb 14.4 oz 277 lb 1.9 oz 275 lb 9.2 oz  Weight (kg) 125.6 kg 125.7 kg 125 kg      Telemetry    Sinus rhythm with PACs, 80s-90s bpm - Personally Reviewed  Physical Exam   GEN: No acute distress.   Neck: JVD not well visualized Cardiac: RRR, no murmurs, rubs, or gallops. Right femoral arteriotomy  site is without active bleeding, bruising, swelling, warmth, erythema, or TTP. No bruit.  Respiratory: Lung sounds diminished bilaterally. On supplemental oxygen via nasal cannula at 3L (baseline). GI: Soft, nontender, non-distended  MS: Trace LE edema; No deformity. Neuro:  Nonfocal  Psych: Normal affect   Labs    High Sensitivity Troponin:   Recent Labs  Lab 02/15/23 1058 02/15/23 1859 03/07/23 0901 03/07/23 1044 03/08/23 0422  TROPONINIHS 458* 314* 192* 580* 5,785*     Chemistry Recent Labs  Lab 03/07/23 0434 03/08/23 0422 03/09/23 0727  NA 139 133* 134*  K 4.1 3.4* 4.0  CL 101 95* 97*  CO2 26 27 26   GLUCOSE 138* 130* 144*  BUN 42* 38* 35*  CREATININE 2.19* 2.00* 1.95*  CALCIUM 9.2 8.8* 9.0  GFRNONAA 30* 34* 35*  ANIONGAP 12 11 11     Lipids No results for  input(s): "CHOL", "TRIG", "HDL", "LABVLDL", "LDLCALC", "CHOLHDL" in the last 168 hours.  Hematology Recent Labs  Lab 03/05/23 0326 03/06/23 0427 03/07/23 0434  WBC 9.1 8.7 9.1  RBC 3.08* 3.00* 3.22*  HGB 8.8* 8.5* 9.0*  HCT 28.4* 27.0* 29.1*  MCV 92.2 90.0 90.4  MCH 28.6 28.3 28.0  MCHC 31.0 31.5 30.9  RDW 16.6* 16.8* 17.0*  PLT 340 321 336   Thyroid No results for input(s): "TSH", "FREET4" in the last 168 hours.  BNPNo results for input(s): "BNP", "PROBNP" in the last 168 hours.  DDimer No results for input(s): "DDIMER" in the last 168 hours.   Radiology    CT HEAD WO CONTRAST ( ) Result Date: 03/07/2023 IMPRESSION: No hemorrhage or CT evidence of an acute cortical infarct. Electronically Signed   By: Lorenza Cambridge M.D.   On: 03/07/2023 14:55    Cardiac Studies   03/08/2023 LHC   Dist LM lesion is 90% stenosed.   Mid LAD lesion is 100% stenosed.   Ost Cx to Prox Cx lesion is 100% stenosed.   Prox Graft lesion between 2nd Diag and Mid LAD  is 90% stenosed.   Origin lesion is 100% stenosed.   1st Diag lesion is 100% stenosed.   Mid LM lesion is 95% stenosed.   Ost RCA to Prox RCA lesion is 100% stenosed.   Insertion lesion before 2nd Diag  is 20% stenosed.   Previously placed Dist Graft stent of unknown type  before 2nd Diag is  widely patent.   SVG and is large.   SVG.   1.  Severe underlying three-vessel coronary artery disease with patent LIMA to diagonal and SVG to LAD with patent stents in this graft.  Chronically occluded SVG to OM.  The native right coronary artery was open before but diffusely diseased.  However, it is now occluded proximally with collaterals noted from the left side. 2.  Left ventricular angiography was not performed due to chronic kidney disease. 3.  Mildly elevated left ventricular end-diastolic pressure at 18 mmHg.   Recommendations: Suspect that occluded RCA is the likely culprit for myocardial infarction.  The vessel was severely  diseased before and is not suitable for PCI now.  There is also severe stenosis in the left main artery supplying mainly few septal branches.  Thus, risks of PCI outweighed the benefit here.  Recommend medical therapy as there are no revascularization options to the chronically occluded left circumflex and right coronary arteries. I added small dose Toprol. Will give gentle hydration to decrease the chance of contrast-induced nephropathy.  02/15/23 Echocardiogram 1. Left ventricular ejection fraction, by  estimation, is 30 to 35%. The  left ventricle has moderately decreased function. Left ventricular  endocardial border not optimally defined to evaluate regional wall motion.  The left ventricular internal cavity  size was mildly to moderately dilated. There is mild left ventricular  hypertrophy. Left ventricular diastolic parameters are consistent with  Grade II diastolic dysfunction (pseudonormalization). Elevated left atrial  pressure.   2. Right ventricular systolic function was not well visualized. The right  ventricular size is not well visualized.   3. Left atrial size was mildly dilated.   4. Right atrial size was moderately dilated.   5. The mitral valve was not well visualized. No evidence of mitral valve  regurgitation. No evidence of mitral stenosis.   6. The aortic valve has an indeterminant number of cusps. Aortic valve  regurgitation is trivial.   7. Pulmonic valve regurgitation not well-assessed.    10/29/21 Echocardiogram 1. Left ventricular ejection fraction, by estimation, is 50 to 55%. The  left ventricle has low normal function. Select images concerning for  inferior wall hypokinesis. Left ventricular diastolic parameters are  consistent with Grade I diastolic  dysfunction (impaired relaxation).   2. Right ventricular systolic function is normal. The right ventricular  size is normal.   3. Left atrial size was mildly dilated.   4. The mitral valve was not well  visualized. Mild mitral valve  regurgitation. No evidence of mitral stenosis.   5. The aortic valve was not well visualized. Aortic valve regurgitation  is mild. No aortic stenosis is present.   6. The inferior vena cava is normal in size with greater than 50%  respiratory variability, suggesting right atrial pressure of 3 mmHg.  Patient Profile     77 y.o. male with history of CAD/CABG x 42007, HFrEF EF 35%, hypertension, COPD, CKD presenting with shortness of breath being seen for acute CHF exacerbation.  Hospital course complicated by urinary tract obstruction, hematuria. Cardiology was asked to reevaluate due to new symptom of chest pain.  Assessment & Plan    NSTEMI CAD - Patient experienced dull, achy pain in his left chest and left shoulder blade with associated shortness of breath and lightheadedness 12/23 ~0500. Little relief with sublingual nitroglycerin x2. Full relief with oxycodone.  - Patient reports worsening symptoms of nausea and weakness overnight, denies further chest pain - EKG unchanged from previous - Creatinine 2.00, seems near baseline for patient - Troponin trended to 5785 on 12/24 - Continue aspirin 81 mg, ezetimibe 10 mg, Imdur 60 mg, pravastatin 40 mg, and newly started Toprol XL 25 mg - LDL 75, intolerant to higher intensity statin -Consider addition of PCSK9i or Nexletol as an outpatient if LDL remains above goal  - [facility] 12/24 with medical therapy as outlined above - Post cath instructions     Acute on chronic HFrEF with ICM - Echo this admission shows LVEF 30-35% - Torsemide held due to uptrending SCr - Tolerating newly started Toprol XL - Not on ACEi,ARB.ARNI,MRA with CKD - Not on SGLT2i with urinary obstruction   Acute on CKD stage IIIb - Renal function stable post cath - Avoid nephrotoxic agents  Normocytic anemia with hematuria - Hgb stable    For questions or updates, please contact [facility] Please consult [email or web address] for  contact info under        Signed, Eula Listen, PA-C  03/09/2023, 9:06 AM

## 2023-03-10 DIAGNOSIS — E1159 Type 2 diabetes mellitus with other circulatory complications: Secondary | ICD-10-CM | POA: Diagnosis not present

## 2023-03-10 DIAGNOSIS — J432 Centrilobular emphysema: Secondary | ICD-10-CM

## 2023-03-10 DIAGNOSIS — I214 Non-ST elevation (NSTEMI) myocardial infarction: Secondary | ICD-10-CM | POA: Diagnosis not present

## 2023-03-10 DIAGNOSIS — I5023 Acute on chronic systolic (congestive) heart failure: Secondary | ICD-10-CM | POA: Diagnosis not present

## 2023-03-10 DIAGNOSIS — G4733 Obstructive sleep apnea (adult) (pediatric): Secondary | ICD-10-CM | POA: Diagnosis not present

## 2023-03-10 DIAGNOSIS — J9621 Acute and chronic respiratory failure with hypoxia: Secondary | ICD-10-CM | POA: Diagnosis not present

## 2023-03-10 DIAGNOSIS — R710 Precipitous drop in hematocrit: Secondary | ICD-10-CM | POA: Diagnosis not present

## 2023-03-10 LAB — TYPE AND SCREEN
ABO/RH(D): A POS
Antibody Screen: NEGATIVE

## 2023-03-10 LAB — CBC
HCT: 26.5 % — ABNORMAL LOW (ref 39.0–52.0)
Hemoglobin: 8.2 g/dL — ABNORMAL LOW (ref 13.0–17.0)
MCH: 29.1 pg (ref 26.0–34.0)
MCHC: 30.9 g/dL (ref 30.0–36.0)
MCV: 94 fL (ref 80.0–100.0)
Platelets: 269 K/uL (ref 150–400)
RBC: 2.82 MIL/uL — ABNORMAL LOW (ref 4.22–5.81)
RDW: 17 % — ABNORMAL HIGH (ref 11.5–15.5)
WBC: 7.3 K/uL (ref 4.0–10.5)
nRBC: 0 % (ref 0.0–0.2)

## 2023-03-10 LAB — BASIC METABOLIC PANEL
Anion gap: 10 (ref 5–15)
BUN: 33 mg/dL — ABNORMAL HIGH (ref 8–23)
CO2: 28 mmol/L (ref 22–32)
Calcium: 8.9 mg/dL (ref 8.9–10.3)
Chloride: 96 mmol/L — ABNORMAL LOW (ref 98–111)
Creatinine, Ser: 2.08 mg/dL — ABNORMAL HIGH (ref 0.61–1.24)
GFR, Estimated: 32 mL/min — ABNORMAL LOW (ref >60)
Glucose, Bld: 127 mg/dL — ABNORMAL HIGH (ref 70–99)
Potassium: 3.6 mmol/L (ref 3.5–5.1)
Sodium: 134 mmol/L — ABNORMAL LOW (ref 135–145)

## 2023-03-10 LAB — FERRITIN: Ferritin: 41 ng/mL (ref 24–336)

## 2023-03-10 LAB — HEMOGLOBIN: Hemoglobin: 8.7 g/dL — ABNORMAL LOW (ref 13.0–17.0)

## 2023-03-10 MED ORDER — IRON SUCROSE 300 MG IVPB - SIMPLE MED
300.0000 mg | Freq: Once | Status: AC
  Administered 2023-03-10: 300 mg via INTRAVENOUS
  Filled 2023-03-10: qty 300

## 2023-03-10 MED ORDER — TORSEMIDE 20 MG PO TABS
40.0000 mg | ORAL_TABLET | Freq: Every day | ORAL | Status: DC
  Administered 2023-03-10 – 2023-03-15 (×6): 40 mg via ORAL
  Filled 2023-03-10 (×6): qty 2

## 2023-03-10 MED ORDER — SALINE SPRAY 0.65 % NA SOLN
1.0000 | NASAL | Status: DC | PRN
  Administered 2023-03-14: 1 via NASAL
  Filled 2023-03-10: qty 44

## 2023-03-10 MED ORDER — PANTOPRAZOLE SODIUM 40 MG PO TBEC
40.0000 mg | DELAYED_RELEASE_TABLET | Freq: Every day | ORAL | Status: DC
  Administered 2023-03-10: 40 mg via ORAL

## 2023-03-10 MED ORDER — PANTOPRAZOLE SODIUM 40 MG PO TBEC
40.0000 mg | DELAYED_RELEASE_TABLET | Freq: Two times a day (BID) | ORAL | Status: DC
  Administered 2023-03-10 – 2023-03-15 (×10): 40 mg via ORAL
  Filled 2023-03-10 (×10): qty 1

## 2023-03-10 MED ORDER — PANTOPRAZOLE SODIUM 40 MG PO TBEC
40.0000 mg | DELAYED_RELEASE_TABLET | Freq: Every day | ORAL | Status: DC
  Filled 2023-03-10: qty 1

## 2023-03-10 NOTE — Progress Notes (Signed)
Physical Therapy Treatment Patient Details Name: Tony Williams MRN: [id number] DOB: 1945-12-11 Today's Date: 03/10/2023   History of Present Illness [name] is a 77 year old male with history of CAD status post CABG and subsequent PCI's, chronic HFpEF, hypertension, hyperlipidemia, type 2 diabetes mellitus, statin intolerance, COPD, CKD stage III, and obstructive sleep apnea, who presented with SOB as well as 3-week history of progressive lower extremity edema and shortness of breath associated with a 20 pound weight gain.    PT Comments  Pt pleasant and motivated to work with PT but did have nausea and general feeling poorly t/o the session.  He was able to move relatively well in bed with heavy UE [place] on rails but not needing assist to get to sitting.  Light assist with transfer to standing.  Pt with very slow and labored effort with modest ambulation (~15 ft) effort, with standing rest break and feeling of nausea and fatigue.  Heavy use of walker with stop-go cadence; SpO2 remains in the 90s on 4L.  After rest break encouraged pt to try another bout of ambulation but he sites fatigue and feeling poorly and does not wish to do another bout of ambulation. Pt pleasant and motivated, but still very functionally limited.  Pt will require further PT, continue with POC.     If plan is discharge home, recommend the following: Assist for transportation;Assistance with cooking/housework;Assistance with feeding;Direct supervision/assist for medications management;Help with stairs or ramp for entrance   Can travel by private vehicle     No  Equipment Recommendations  Rolling walker (2 wheels)    Recommendations for Other Services       Precautions / Restrictions Precautions Precautions: Fall Restrictions Weight Bearing Restrictions Per Provider Order: No     Mobility  Bed Mobility Overal bed mobility: Modified Independent Bed Mobility: Sit to Supine Rolling: Contact guard  assist   Supine to sit: Contact guard     General bed mobility comments: heavy use of rails, extra time, no phyiscal assist needed    Transfers Overall transfer level: Needs assistance Equipment used: Rolling walker (2 wheels) Transfers: Sit to/from Stand, Bed to chair/wheelchair/BSC Sit to Stand: Contact guard assist, Min assist           General transfer comment: Pt struggled to translate weight forward and up into walker, light tactile cuing to insure maintainance of forward weight shift    Ambulation/Gait Ambulation/Gait assistance: Contact guard assist Gait Distance (Feet): 15 Feet Assistive device: Rolling walker (2 wheels)         General Gait Details: Pt with very slow and guarded gait, heavy reliance on the walker.  On 4L with SpO2 remaining in the 90s, HR up to low 100s.  Pt did not have any LOBs but was quick to fatigue, brief standing rest break after ~10 ft with motivation to do more but feeling poorly and ulimtately only able to take a few more steps before needing to sit.  Sites fatigue and nausea.   Stairs             Wheelchair Mobility     Tilt Bed    Modified Rankin (Stroke Patients Only)       Balance Overall balance assessment: Needs assistance Sitting-balance support: Single extremity supported Sitting balance-Leahy Scale: Good Sitting balance - Comments: able to maintain sitting EOB w/o assist     Standing balance-Leahy Scale: Poor Standing balance comment: heavy b/l UE use on walker, poor tolerance and confidence  Cognition Arousal: Alert Behavior During Therapy: WFL for tasks assessed/performed Overall Cognitive Status: Within Functional Limits for tasks assessed                                          Exercises      General Comments General comments (skin integrity, edema, etc.): Pt motivated to try and walk more than prior PT session, and did, but only by a few  feet and still far from baseline or ability to safely transition home.  Fatigue and dizziness with modest upright/ambulation effort.      Pertinent Vitals/Pain Pain Assessment Pain Assessment: No/denies pain    Home Living                          Prior Function            PT Goals (current goals can now be found in the care plan section) Progress towards PT goals: Progressing toward goals (slow progress)    Frequency    Min 1X/week      PT Plan      Co-evaluation              AM-PAC PT "6 Clicks" Mobility   Outcome Measure  Help needed turning from your back to your side while in a flat bed without using bedrails?: None Help needed moving from lying on your back to sitting on the side of a flat bed without using bedrails?: None Help needed moving to and from a bed to a chair (including a wheelchair)?: A Lot Help needed standing up from a chair using your arms (e.g., wheelchair or bedside chair)?: A Little Help needed to walk in hospital room?: A Lot Help needed climbing 3-5 steps with a railing? : Total 6 Click Score: 16    End of Session Equipment Utilized During Treatment: Oxygen (4L) Activity Tolerance: Patient limited by fatigue Patient left: with chair alarm set;with call bell/phone within reach Nurse Communication: Mobility status PT Visit Diagnosis: Difficulty in walking, not elsewhere classified (R26.2);Other abnormalities of gait and mobility (R26.89);Pain Pain - part of body:  (lumbago)     Time: 8295-6213 PT Time Calculation (min) (ACUTE ONLY): 16 min  Charges:    $Gait Training: 8-22 mins PT General Charges $$ ACUTE PT VISIT: 1 Visit                     Malachi Pro, DPT 03/10/2023, 12:08 PM

## 2023-03-10 NOTE — TOC Progression Note (Signed)
Transition of Care [facility]) - Progression Note    Patient Details  Name: Tony Williams MRN: [id number] Date of Birth: October 06, 1945  [facility] of [facility]) CM/SW Contact  Garret Reddish, RN Phone Number: 03/10/2023, 5:11 PM  Clinical Narrative:    Attempted to submit SNF authorization.  [employer] is down at this time.          Expected Discharge Plan and Services                                               Social Determinants of Health (SDOH) Interventions SDOH Screenings   Food Insecurity: No Food Insecurity (02/15/2023)  Housing: Low Risk  (02/24/2023)  Transportation Needs: No Transportation Needs (02/18/2023)  Utilities: Not At Risk (02/15/2023)  Alcohol Screen: Low Risk  (08/10/2022)  Depression (PHQ2-9): Low Risk  (08/10/2022)  Financial Resource Strain: Low Risk  (02/18/2023)  Physical Activity: Inactive (05/19/2021)  Social Connections: Unknown (05/19/2021)  Stress: No Stress Concern Present (05/19/2021)  Recent Concern: Stress - Stress Concern Present (02/26/2021)  Tobacco Use: Medium Risk (02/18/2023)    Readmission Risk Interventions    02/15/2023    9:37 AM 12/11/2020   11:06 AM  Readmission Risk Prevention Plan  Transportation Screening Complete Complete  PCP or Specialist Appt within 5-7 Days  Complete  PCP or Specialist Appt within 3-5 Days Complete   Medication Review (RN CM)  Complete  HRI or Home Care Consult Complete   Social Work Consult for Recovery Care Planning/Counseling Complete   Palliative Care Screening Not Applicable   Medication Review Oceanographer) Not Complete   Med Review Comments patient will discuss medication upon discharge

## 2023-03-10 NOTE — Progress Notes (Signed)
Rounding Note    Patient Name: Tony Williams Date of Encounter: 03/10/2023  [facility] Cardiologist: Debbe Odea, MD   Subjective   Patient reports ongoing symptoms of nausea and SOB. Denies chest pain, palpitations, and lightheadedness. Did require increase in supplemental O2 yesterday to today. On 5 L now.   Inpatient Medications    Scheduled Meds:  allopurinol  100 mg Oral Daily   aspirin EC  81 mg Oral Daily   cholecalciferol  4,000 Units Oral Daily   docusate sodium  200 mg Oral BID   escitalopram  20 mg Oral Daily   ezetimibe  10 mg Oral Daily   ferrous sulfate  325 mg Oral Q breakfast   heparin injection (subcutaneous)  5,000 Units Subcutaneous Q8H   hydrALAZINE  10 mg Oral Q8H   isosorbide mononitrate  60 mg Oral Daily   lidocaine  2 patch Transdermal Q24H   loratadine  10 mg Oral Daily   metoprolol succinate  25 mg Oral Daily   mirtazapine  15 mg Oral QHS   pantoprazole  40 mg Oral Daily   polyethylene glycol  17 g Oral Daily   pravastatin  40 mg Oral q1800   QUEtiapine  25 mg Oral QHS   sodium chloride flush  3 mL Intravenous Q12H   sodium chloride flush  3 mL Intravenous Q12H   Continuous Infusions:  promethazine (PHENERGAN) injection (IM or IVPB) 12.5 mg (03/10/23 0933)   PRN Meds: acetaminophen, albuterol, dextromethorphan-guaiFENesin, lactulose, meclizine, nitroGLYCERIN, mouth rinse, oxyCODONE, polyvinyl alcohol, promethazine (PHENERGAN) injection (IM or IVPB), sodium chloride flush   Vital Signs    Vitals:   03/09/23 2142 03/09/23 2339 03/10/23 0429 03/10/23 0917  BP: 118/61 118/60 (!) 128/58 136/63  Pulse:  69 72 87  Resp:  19 20 16   Temp:  97.8 F (36.6 C) 98.4 F (36.9 C) 97.7 F (36.5 C)  TempSrc:  Oral Oral   SpO2:  100% 100% 100%  Weight:   125.2 kg   Height:        Intake/Output Summary (Last 24 hours) at 03/10/2023 1248 Last data filed at 03/10/2023 0429 Gross per 24 hour  Intake 120 ml  Output 425 ml  Net  -305 ml      03/10/2023    4:29 AM 03/09/2023    5:00 AM 03/08/2023    5:00 AM  Last 3 Weights  Weight (lbs) 276 lb 276 lb 14.4 oz 277 lb 1.9 oz  Weight (kg) 125.193 kg 125.6 kg 125.7 kg      Telemetry    Sinus rhythm with PACs, rate 70-90 bpm - Personally Reviewed  Physical Exam   GEN: No acute distress.   Neck: JVD not well visualized Cardiac: RRR, no murmurs, rubs, or gallops. Cath site stable.  Respiratory: Lung sounds diminished bilaterally. GI: Soft, nontender, non-distended  MS: Trace LE edema; No deformity. Neuro:  Nonfocal  Psych: Normal affect   Labs    High Sensitivity Troponin:   Recent Labs  Lab 02/15/23 1058 02/15/23 1859 03/07/23 0901 03/07/23 1044 03/08/23 0422  TROPONINIHS 458* 314* 192* 580* 5,785*     Chemistry Recent Labs  Lab 03/08/23 0422 03/09/23 0727 03/10/23 0438  NA 133* 134* 134*  K 3.4* 4.0 3.6  CL 95* 97* 96*  CO2 27 26 28   GLUCOSE 130* 144* 127*  BUN 38* 35* 33*  CREATININE 2.00* 1.95* 2.08*  CALCIUM 8.8* 9.0 8.9  GFRNONAA 34* 35* 32*  ANIONGAP 11 11 10  Lipids No results for input(s): "CHOL", "TRIG", "HDL", "LABVLDL", "LDLCALC", "CHOLHDL" in the last 168 hours.  Hematology Recent Labs  Lab 03/06/23 0427 03/07/23 0434 03/10/23 0438  WBC 8.7 9.1 7.3  RBC 3.00* 3.22* 2.82*  HGB 8.5* 9.0* 8.2*  HCT 27.0* 29.1* 26.5*  MCV 90.0 90.4 94.0  MCH 28.3 28.0 29.1  MCHC 31.5 30.9 30.9  RDW 16.8* 17.0* 17.0*  PLT 321 336 269   Thyroid No results for input(s): "TSH", "FREET4" in the last 168 hours.  BNPNo results for input(s): "BNP", "PROBNP" in the last 168 hours.  DDimer No results for input(s): "DDIMER" in the last 168 hours.   Radiology    No results found.  Cardiac Studies   03/08/2023 LHC   Dist LM lesion is 90% stenosed.   Mid LAD lesion is 100% stenosed.   Ost Cx to Prox Cx lesion is 100% stenosed.   Prox Graft lesion between 2nd Diag and Mid LAD  is 90% stenosed.   Origin lesion is 100% stenosed.   1st  Diag lesion is 100% stenosed.   Mid LM lesion is 95% stenosed.   Ost RCA to Prox RCA lesion is 100% stenosed.   Insertion lesion before 2nd Diag  is 20% stenosed.   Previously placed Dist Graft stent of unknown type  before 2nd Diag is  widely patent.   SVG and is large.   SVG.   1.  Severe underlying three-vessel coronary artery disease with patent LIMA to diagonal and SVG to LAD with patent stents in this graft.  Chronically occluded SVG to OM.  The native right coronary artery was open before but diffusely diseased.  However, it is now occluded proximally with collaterals noted from the left side. 2.  Left ventricular angiography was not performed due to chronic kidney disease. 3.  Mildly elevated left ventricular end-diastolic pressure at 18 mmHg.   Recommendations: Suspect that occluded RCA is the likely culprit for myocardial infarction.  The vessel was severely diseased before and is not suitable for PCI now.  There is also severe stenosis in the left main artery supplying mainly few septal branches.  Thus, risks of PCI outweighed the benefit here.  Recommend medical therapy as there are no revascularization options to the chronically occluded left circumflex and right coronary arteries. I added small dose Toprol. Will give gentle hydration to decrease the chance of contrast-induced nephropathy.   02/15/23 Echocardiogram 1. Left ventricular ejection fraction, by estimation, is 30 to 35%. The  left ventricle has moderately decreased function. Left ventricular  endocardial border not optimally defined to evaluate regional wall motion.  The left ventricular internal cavity  size was mildly to moderately dilated. There is mild left ventricular  hypertrophy. Left ventricular diastolic parameters are consistent with  Grade II diastolic dysfunction (pseudonormalization). Elevated left atrial  pressure.   2. Right ventricular systolic function was not well visualized. The right  ventricular  size is not well visualized.   3. Left atrial size was mildly dilated.   4. Right atrial size was moderately dilated.   5. The mitral valve was not well visualized. No evidence of mitral valve  regurgitation. No evidence of mitral stenosis.   6. The aortic valve has an indeterminant number of cusps. Aortic valve  regurgitation is trivial.   7. Pulmonic valve regurgitation not well-assessed.  Patient Profile     77 y.o. male with history of CAD/CABG x 42007, HFrEF EF 35%, hypertension, COPD, CKD presenting with shortness of breath  being seen for acute CHF exacerbation and NSTEMI.   Assessment & Plan    NSTEMI CAD - Patient experienced dull, achy pain in his left chest and left shoulder blade with associated shortness of breath and lightheadedness 12/23 ~0500. Little relief with sublingual nitroglycerin x2. Full relief with oxycodone.  - Troponin trended to 5785 on 12/24 - Continue aspirin 81 mg, ezetimibe 10 mg, Imdur 60 mg, pravastatin 40 mg, and Toprol XL 25 mg (started yesterday) - LDL 75, intolerant to higher intensity statin -Consider addition of PCSK9i or Nexletol as an outpatient if LDL remains above goal  - [facility] 12/24 with medical therapy as outlined above - Post cath instructions  Acute on chronic HFrEF with ICM - Echo this admission shows LVEF 30-35% - Torsemide previously held due to uptrending Scr. Cr now 2.08 which is near baseline for patient, will restart torsemide 40 mg daily. - Continue Toprol XL 25 mg (started yesterday) - Not on ACEi,ARB.ARNI,MRA with CKD - Not on SGLT2i with urinary obstruction   Acute on CKD IIIb - Renal function stable post cath - Continue to monitor with reintroduction of torsemide  Normocytic anemia with hematuria - Hgb stable  For questions or updates, please contact [facility] Please consult [email or web address] for contact info under        Signed, Orion Crook, PA-C  03/10/2023, 12:48 PM

## 2023-03-10 NOTE — Progress Notes (Signed)
Progress Note   Patient: Tony Williams ION:[id number] DOB: 12-25-1945 DOA: 02/14/2023     24 DOS: the patient was seen and examined on 03/10/2023   Brief hospital course: 77 year old man past medical history of congestive heart failure, hyperlipidemia, hypertension, type 2 diabetes mellitus, COPD on 3 L of oxygen, CAD, gout, morbid obesity, CKD, depression and anxiety, obstructive sleep apnea presented with shortness of breath.  Patient was admitted with heart failure exacerbation.  Patient pulled out Foley catheter on 12/10 and had hematuria and clots.  Foley placed on 12/11.  Patient had suprapubic catheter placed on 12/14.  12/15.  Creatinine increased to 3.05.  Held torsemide and gentle IV fluids overnight.  Hemoglobin did drift down to 9.7.  Continue to watch closely. 12/16.  Creatinine down to 2.27.  Discontinue fluids.  Hemoglobin drifted down to 8.8.  Recheck creatinine tomorrow.  Recheck hemoglobin tomorrow.  Hold aspirin and heparin subcutaneous injection. 12/17.  Creatinine down to 2.15 and hemoglobin 8.7.  Notified by transitional care team the rehab that they were planning on going to is out of network with his insurance.  TOC to expand bed search.  As per Dr. Mayford Knife 12/18-12/22/24: Pt's Cr started to rise again so torsemide was held and started IVFs x 1 day and trending down slightly today. Of note, insurance Berkley Harvey was denied b/c pt was not able to participate w/ therapy b/c of dizziness but CM reapplied for insurance auth and that is pending currently.   12/23.  Patient complaining of nausea consistently for the past 3 days.  He has been able to eat but still feeling very nauseous.  Patient states he is claustrophobic and cannot get into MRI.  CT scan of the head negative.  Patient also complained of chest pain.  First troponin 192 and second troponin up at 580. 12/24.  Troponin went up to 5785.  Cardiac catheterization showed severe three-vessel disease.  Cardiology recommended  medical management.  Small dose of Toprol added. 12/25.  Nausea a little bit improved with the Phenergan rather than the Zofran. 12/26.  Asked TOC to reapply for insurance authorization.  Started Protonix.  Will give IV iron with hemoglobin dipping down to 8.2 this morning but up to 8.7 this afternoon.  Assessment and Plan: * NSTEMI (non-ST elevated myocardial infarction) [facility]) Cardiac catheterization showed triple-vessel disease.  Troponin rose to 5785.  Cardiology recommended medical management.  Patient on aspirin, pravastatin, low-dose Toprol and Imdur.  Acute on chronic systolic CHF (congestive heart failure) (HCC) Last echocardiogram shows an EF of 30 to 35%.  Patient on hydralazine, Imdur, low-dose Toprol and back on torsemide.  Acute kidney injury superimposed on CKD (HCC) AKI on CKD stage IIIb.  Creatinine has been variable during the hospital course.  Last creatinine 2.08.  Back on torsemide.  Acute on chronic respiratory failure with hypoxia (HCC) This morning was on 5 L for some reason.  I dialed him down to 4.  Chronically wears 3.  Drop in hemoglobin Acute blood loss anemia.  Patient did have a drop in hemoglobin during the hospital course but had hematuria especially after the suprapubic tube.  Hemoglobin 8.2 this morning but repeat in the afternoon 8.7.  Will give IV iron.  Chronic obstructive pulmonary disease (HCC) Respiratory status stable  Type II diabetes mellitus with renal manifestations (HCC) Last hemoglobin A1c 6.8.  Patient on diet control.  Hyperlipidemia associated with type 2 diabetes mellitus (HCC) Currently on pravastatin  Blurry vision, bilateral Discontinue Toviaz  Depression with  anxiety On Lexapro  OSA (obstructive sleep apnea) CPAP at night  Obesity, Class III, BMI 40-49.9 (morbid obesity) (HCC) BMI 39.73  Nausea Continue as needed Phenergan.  Added Protonix twice daily.  Acute delirium Improved with Seroquel        Subjective:  Patient's hemoglobin this morning dipped down to 8.2.  Repeat hemoglobin in the afternoon 8.7.  Will give IV iron.  Hold off on oral iron.  Patient's nausea is better with Phenergan.  Will start Protonix.  Physical Exam: Vitals:   03/09/23 2339 03/10/23 0429 03/10/23 0917 03/10/23 1339  BP: 118/60 (!) 128/58 136/63 (!) 119/55  Pulse: 69 72 87 81  Resp: 19 20 16 17   Temp: 97.8 F (36.6 C) 98.4 F (36.9 C) 97.7 F (36.5 C) 98.3 F (36.8 C)  TempSrc: Oral Oral    SpO2: 100% 100% 100% 100%  Weight:  125.2 kg    Height:       Physical Exam HENT:     Head: Normocephalic.  Eyes:     General: Lids are normal.     Conjunctiva/sclera: Conjunctivae normal.  Cardiovascular:     Rate and Rhythm: Normal rate and regular rhythm.     Heart sounds: Normal heart sounds, S1 normal and S2 normal.  Pulmonary:     Breath sounds: Examination of the right-lower field reveals decreased breath sounds. Examination of the left-lower field reveals decreased breath sounds. Decreased breath sounds present. No wheezing, rhonchi or rales.  Abdominal:     Palpations: Abdomen is soft.     Tenderness: There is no abdominal tenderness.  Musculoskeletal:     Right lower leg: No swelling.     Left lower leg: No swelling.  Skin:    General: Skin is warm.     Findings: No rash.  Neurological:     Mental Status: He is alert.     Data Reviewed: Creatinine 2.08, sodium 134, hemoglobin this morning 8.2.  Hemoglobin this afternoon 8.7. Family Communication: Spoke with family at the bedside  Disposition: Status is: Inpatient Will need insurance authorization for rehab.  Will give IV iron today.  Planned Discharge Destination: Rehab    Time spent: 28 minutes  Author: Alford Highland, MD 03/10/2023 3:20 PM  For on call review [email or web address].

## 2023-03-11 ENCOUNTER — Inpatient Hospital Stay: Payer: Medicare Other

## 2023-03-11 ENCOUNTER — Encounter: Payer: Self-pay | Admitting: Cardiovascular Disease

## 2023-03-11 DIAGNOSIS — I5033 Acute on chronic diastolic (congestive) heart failure: Secondary | ICD-10-CM | POA: Diagnosis not present

## 2023-03-11 LAB — BASIC METABOLIC PANEL
Anion gap: 8 (ref 5–15)
BUN: 33 mg/dL — ABNORMAL HIGH (ref 8–23)
CO2: 29 mmol/L (ref 22–32)
Calcium: 8.7 mg/dL — ABNORMAL LOW (ref 8.9–10.3)
Chloride: 99 mmol/L (ref 98–111)
Creatinine, Ser: 2.34 mg/dL — ABNORMAL HIGH (ref 0.61–1.24)
GFR, Estimated: 28 mL/min — ABNORMAL LOW (ref >60)
Glucose, Bld: 107 mg/dL — ABNORMAL HIGH (ref 70–99)
Potassium: 3.7 mmol/L (ref 3.5–5.1)
Sodium: 136 mmol/L (ref 135–145)

## 2023-03-11 LAB — HEMOGLOBIN: Hemoglobin: 7.8 g/dL — ABNORMAL LOW (ref 13.0–17.0)

## 2023-03-11 MED ORDER — FUROSEMIDE 10 MG/ML IJ SOLN
40.0000 mg | Freq: Once | INTRAMUSCULAR | Status: AC
  Administered 2023-03-11: 40 mg via INTRAVENOUS
  Filled 2023-03-11: qty 4

## 2023-03-11 MED ORDER — PROCHLORPERAZINE EDISYLATE 10 MG/2ML IJ SOLN
5.0000 mg | Freq: Four times a day (QID) | INTRAMUSCULAR | Status: DC | PRN
  Administered 2023-03-11 – 2023-03-14 (×3): 5 mg via INTRAVENOUS
  Filled 2023-03-11 (×5): qty 1

## 2023-03-11 NOTE — Progress Notes (Signed)
Occupational Therapy Re-Evaluation  Patient Details Name: Saveon Barsamian MRN: [id number] DOB: Dec 23, 1945 Today's Date: 03/11/2023   History of present illness Mr. Nicolino Marvel is a 77 year old male with history of CAD status post CABG and subsequent PCI's, chronic HFpEF, hypertension, hyperlipidemia, type 2 diabetes mellitus, statin intolerance, COPD, CKD stage III, and obstructive sleep apnea, who presented with SOB as well as 3-week history of progressive lower extremity edema and shortness of breath associated with a 20 pound weight gain.   OT comments  Chart reviewed to date, pt greeted in bed, agreeable to OT tx session. Goals updated to reflect current level of functioning. Pt reports feeling nauseous, unchanged with positional changes. Supervision required for bed mobility, STS with CGA, short amb transfer to bedside chair with CGA. Pt performs grooming tasks with SET UP in chair. Pt is motivated to improve functional status, reports fatigue and nausea therefore further mobility deferred. Spo2 >90% on 4L via [place] throughout, HR in the 80s bpm throughout. Pt is left with nurse entering room, wife present; OT will continue to follow acutely.       If plan is discharge home, recommend the following:  A lot of help with walking and/or transfers;A lot of help with bathing/dressing/bathroom;Help with stairs or ramp for entrance;Direct supervision/assist for medications management;Assist for transportation;Assistance with cooking/housework   Equipment Recommendations  BSC/3in1    Recommendations for Other Services      Precautions / Restrictions Precautions Precautions: Fall Restrictions Weight Bearing Restrictions Per Provider Order: No       Mobility Bed Mobility Overal bed mobility: Needs Assistance Bed Mobility: Supine to Sit     Supine to sit: Supervision, HOB elevated, Used rails          Transfers Overall transfer level: Needs assistance Equipment used: Rolling  walker (2 wheels) Transfers: Sit to/from Stand Sit to Stand: Contact guard assist           General transfer comment: intermittent vcs for technique     Balance Overall balance assessment: Needs assistance Sitting-balance support: Single extremity supported Sitting balance-Leahy Scale: Good     Standing balance support: Bilateral upper extremity supported, During functional activity, Reliant on assistive device for balance Standing balance-Leahy Scale: Fair                             ADL either performed or assessed with clinical judgement   ADL Overall ADL's : Needs assistance/impaired     Grooming: Wash/dry face;Oral care;Sitting               Lower Body Dressing: Maximal assistance;Bed level   Toilet Transfer: Contact guard assist;Rolling walker (2 wheels) Toilet Transfer Details (indicate cue type and reason): simulated to bedside chair                Extremity/Trunk Assessment              Vision       Perception     Praxis      Cognition Arousal: Alert Behavior During Therapy: WFL for tasks assessed/performed Overall Cognitive Status: Within Functional Limits for tasks assessed                                          Exercises Other Exercises Other Exercises: edu re: role of OT, role of rehab, importance of continued mobility-  pt is motivated despite not feeling well per his report    Shoulder Instructions       General Comments      Pertinent Vitals/ Pain       Pain Assessment Pain Assessment: No/denies pain  Home Living                                          Prior Functioning/Environment              Frequency  Min 1X/week        Progress Toward Goals  OT Goals(current goals can now be found in the care plan section)  Progress towards OT goals: Progressing toward goals  Acute Rehab OT Goals Patient Stated Goal: rehab OT Goal Formulation: With patient Time  For Goal Achievement: 03/25/23 Potential to Achieve Goals: Good ADL Goals Pt Will Perform Grooming: with modified independence;standing;sitting  Plan      Co-evaluation                 AM-PAC OT "6 Clicks" Daily Activity     Outcome Measure   Help from another person eating meals?: None Help from another person taking care of personal grooming?: None Help from another person toileting, which includes using toliet, bedpan, or urinal?: A Lot Help from another person bathing (including washing, rinsing, drying)?: A Lot Help from another person to put on and taking off regular upper body clothing?: A Little Help from another person to put on and taking off regular lower body clothing?: A Lot 6 Click Score: 17    End of Session Equipment Utilized During Treatment: Oxygen;Gait belt  OT Visit Diagnosis: Other abnormalities of gait and mobility (R26.89);Muscle weakness (generalized) (M62.81)   Activity Tolerance Patient limited by fatigue   Patient Left in bed;with call bell/phone within reach;with bed alarm set;with family/visitor present   Nurse Communication Mobility status        Time: 9811-9147 OT Time Calculation (min): 19 min  Charges: OT General Charges $OT Visit: 1 Visit OT Evaluation $OT Re-eval: 1 Re-eval OT Treatments $Therapeutic Activity: 8-22 mins Oleta Mouse, OTD OTR/L  03/11/23, 3:46 PM

## 2023-03-11 NOTE — TOC Progression Note (Signed)
Transition of Care [facility]) - Progression Note    Patient Details  Name: Frantz Ciardi MRN: [id number] Date of Birth: October 13, 1945  Transition [facility]) CM/SW Contact  Garret Reddish, RN Phone Number: 03/11/2023, 4:47 PM  Clinical Narrative:    SNF authorization approved.  Plan auth ID- [id number] approved for 03-11-23- 03-14-2024.  Next review date is 03-15-23.    I have spoken with Deray at [facility] and made her aware of SNF approval. She informs me that [facility] should have an available bed for patient on Monday.    TOC will continue to follow for discharge planning.          Expected Discharge Plan and Services                                               Social Determinants of Health (SDOH) Interventions SDOH Screenings   Food Insecurity: No Food Insecurity (02/15/2023)  Housing: Low Risk  (02/24/2023)  Transportation Needs: No Transportation Needs (02/18/2023)  Utilities: Not At Risk (02/15/2023)  Alcohol Screen: Low Risk  (08/10/2022)  Depression (PHQ2-9): Low Risk  (08/10/2022)  Financial Resource Strain: Low Risk  (02/18/2023)  Physical Activity: Inactive (05/19/2021)  Social Connections: Unknown (05/19/2021)  Stress: No Stress Concern Present (05/19/2021)  Recent Concern: Stress - Stress Concern Present (02/26/2021)  Tobacco Use: Medium Risk (02/18/2023)    Readmission Risk Interventions    02/15/2023    9:37 AM 12/11/2020   11:06 AM  Readmission Risk Prevention Plan  Transportation Screening Complete Complete  PCP or Specialist Appt within 5-7 Days  Complete  PCP or Specialist Appt within 3-5 Days Complete   Medication Review (RN CM)  Complete  HRI or Home Care Consult Complete   Social Work Consult for Recovery Care Planning/Counseling Complete   Palliative Care Screening Not Applicable   Medication Review Oceanographer) Not Complete   Med Review Comments patient will discuss medication upon discharge

## 2023-03-11 NOTE — TOC Progression Note (Signed)
Transition of Care [facility]) - Progression Note    Patient Details  Name: Tony Williams MRN: [id number] Date of Birth: January 15, 1946  Transition of Care [facility]) CM/SW Contact  Garret Reddish, RN Phone Number: 03/11/2023, 12:07 PM  Clinical Narrative:    Chart reviewed.  SNF authorization pending.  I have spoken with Deray from [facility] and she informs me that she will have a bed for patient on Monday pending SNF approval.    TOC will continue to follow for discharge planning.         Expected Discharge Plan and Services                                               Social Determinants of Health (SDOH) Interventions SDOH Screenings   Food Insecurity: No Food Insecurity (02/15/2023)  Housing: Low Risk  (02/24/2023)  Transportation Needs: No Transportation Needs (02/18/2023)  Utilities: Not At Risk (02/15/2023)  Alcohol Screen: Low Risk  (08/10/2022)  Depression (PHQ2-9): Low Risk  (08/10/2022)  Financial Resource Strain: Low Risk  (02/18/2023)  Physical Activity: Inactive (05/19/2021)  Social Connections: Unknown (05/19/2021)  Stress: No Stress Concern Present (05/19/2021)  Recent Concern: Stress - Stress Concern Present (02/26/2021)  Tobacco Use: Medium Risk (02/18/2023)    Readmission Risk Interventions    02/15/2023    9:37 AM 12/11/2020   11:06 AM  Readmission Risk Prevention Plan  Transportation Screening Complete Complete  PCP or Specialist Appt within 5-7 Days  Complete  PCP or Specialist Appt within 3-5 Days Complete   Medication Review (RN CM)  Complete  HRI or Home Care Consult Complete   Social Work Consult for Recovery Care Planning/Counseling Complete   Palliative Care Screening Not Applicable   Medication Review Oceanographer) Not Complete   Med Review Comments patient will discuss medication upon discharge

## 2023-03-11 NOTE — TOC Progression Note (Signed)
Transition of Care [facility]) - Progression Note    Patient Details  Name: Tony Williams MRN: [id number] Date of Birth: Nov 04, 1945  Transition of Care [facility]) CM/SW Contact  Garret Reddish, RN Phone Number: 03/11/2023, 4:56 AM  Clinical Narrative:    [facility] was down on yesterday.  SNF authorization submitted.  SNF authorization pending.  SNF pending Berkley Harvey ID is [id number].  TOC will continue to follow for discharge planning.          Expected Discharge Plan and Services                                               Social Determinants of Health (SDOH) Interventions SDOH Screenings   Food Insecurity: No Food Insecurity (02/15/2023)  Housing: Low Risk  (02/24/2023)  Transportation Needs: No Transportation Needs (02/18/2023)  Utilities: Not At Risk (02/15/2023)  Alcohol Screen: Low Risk  (08/10/2022)  Depression (PHQ2-9): Low Risk  (08/10/2022)  Financial Resource Strain: Low Risk  (02/18/2023)  Physical Activity: Inactive (05/19/2021)  Social Connections: Unknown (05/19/2021)  Stress: No Stress Concern Present (05/19/2021)  Recent Concern: Stress - Stress Concern Present (02/26/2021)  Tobacco Use: Medium Risk (02/18/2023)    Readmission Risk Interventions    02/15/2023    9:37 AM 12/11/2020   11:06 AM  Readmission Risk Prevention Plan  Transportation Screening Complete Complete  PCP or Specialist Appt within 5-7 Days  Complete  PCP or Specialist Appt within 3-5 Days Complete   Medication Review (RN CM)  Complete  HRI or Home Care Consult Complete   Social Work Consult for Recovery Care Planning/Counseling Complete   Palliative Care Screening Not Applicable   Medication Review Oceanographer) Not Complete   Med Review Comments patient will discuss medication upon discharge

## 2023-03-11 NOTE — Progress Notes (Signed)
PROGRESS NOTE    Tony Williams  NWG:[id number] DOB: 11/17/1945 DOA: 02/14/2023 PCP: Erasmo Downer, MD   Assessment & Plan:   Principal Problem:   NSTEMI (non-ST elevated myocardial infarction) [facility]) Active Problems:   Acute kidney injury superimposed on CKD (HCC)   Acute on chronic systolic CHF (congestive heart failure) (HCC)   Acute on chronic respiratory failure with hypoxia (HCC)   Drop in hemoglobin   Chronic obstructive pulmonary disease (HCC)   Hypertension associated with diabetes (HCC)   Hyperlipidemia associated with type 2 diabetes mellitus (HCC)   Type II diabetes mellitus with renal manifestations (HCC)   CAD (coronary artery disease)   Myocardial injury   Blurry vision, bilateral   Chronic gout due to renal impairment without tophus   Depression with anxiety   OSA (obstructive sleep apnea)   Obesity, Class III, BMI 40-49.9 (morbid obesity) (HCC)   Dilated cardiomyopathy (HCC)   Acute delirium   Nausea   Acute on chronic congestive heart failure (HCC)  Assessment and Plan: Acute on chronic systolic CHF: last echo shows an EF of 30 to 35%. Holding imdur, hydralazine, torsemide, metoprolol as BP is low end of normal. Cardio recs apprec  AKI on CKDIIIb: Cr is labile. Avoid nephrotoxic meds    Acute on chronic hypoxic respiratory failure: c/o shortness of breath today. CXR ordered. Continue on supplemental oxygen.   Acute blood loss anemia: H&H are labile. Will transfuse if Hb < 7.0    Urinary retention: s/p suprapubic cath place by uro 12/14.  Outpatient referral for Dr. Caryl Pina in [facility] was placed by inpatient uro    Blurry vision: bilateral. D/c toviaz    COPD: w/o acute exacerbation. Bronchodilators prn    DM2: HbA1c 6.8, well controlled. Diet controlled    Depression: severity unknown. Continue on home dose of lexapro    OSA: pt refuses to wear his CPAP at night    Morbid obesity: BMI 39.8. Complicates overall care & prognosis     Acute delirium: continue on seroquel    Constipation: lactulose prn       DVT prophylaxis: (heparin SQ Code Status: full  Family Communication: discussed pt's care w/ pt's family at bedside and answered his questions  Disposition Plan: d/c to SNF. Waiting on insurance auth  Status is: Inpatient Remains inpatient appropriate because: c/o SOB today, CXR ordered    Level of care: Telemetry Cardiac Consultants:   Nephro  cardio  Procedures:   Antimicrobials:    Subjective: Pt c/o shortness of breath   Objective: Vitals:   03/11/23 0500 03/11/23 0628 03/11/23 0828 03/11/23 1000  BP:  125/60 (!) 107/49   Pulse:  71 74   Resp:   16   Temp:   98.3 F (36.8 C)   TempSrc:      SpO2:   99%   Weight: 125.1 kg   127.8 kg  Height:        Intake/Output Summary (Last 24 hours) at 03/11/2023 1107 Last data filed at 03/11/2023 0600 Gross per 24 hour  Intake 180 ml  Output 1200 ml  Net -1020 ml   Filed Weights   03/10/23 0429 03/11/23 0500 03/11/23 1000  Weight: 125.2 kg 125.1 kg 127.8 kg    Examination:  General exam: Appears calm but uncomfortable  Respiratory system: decreased breath sounds b/l  Cardiovascular system: S1 & S2+. No rubs, gallops or clicks. Gastrointestinal system: Abdomen is obese, soft and nontender. Normal bowel sounds heard. Central nervous system: Alert  and oriented. Moves all extremities  Psychiatry: Judgement and insight appear normal. Flat mood and affect    Data Reviewed: I have personally reviewed following labs and imaging studies  CBC: Recent Labs  Lab 03/05/23 0326 03/06/23 0427 03/07/23 0434 03/10/23 0438 03/10/23 1335 03/11/23 0621  WBC 9.1 8.7 9.1 7.3  --   --   HGB 8.8* 8.5* 9.0* 8.2* 8.7* 7.8*  HCT 28.4* 27.0* 29.1* 26.5*  --   --   MCV 92.2 90.0 90.4 94.0  --   --   PLT 340 321 336 269  --   --    Basic Metabolic Panel: Recent Labs  Lab 03/07/23 0434 03/08/23 0422 03/09/23 0727 03/10/23 0438  03/11/23 0621  NA 139 133* 134* 134* 136  K 4.1 3.4* 4.0 3.6 3.7  CL 101 95* 97* 96* 99  CO2 26 27 26 28 29   GLUCOSE 138* 130* 144* 127* 107*  BUN 42* 38* 35* 33* 33*  CREATININE 2.19* 2.00* 1.95* 2.08* 2.34*  CALCIUM 9.2 8.8* 9.0 8.9 8.7*   GFR: Estimated Creatinine Clearance: 36 mL/min (A) (by C-G formula based on SCr of 2.34 mg/dL (H)). Liver Function Tests: No results for input(s): "AST", "ALT", "ALKPHOS", "BILITOT", "PROT", "ALBUMIN" in the last 168 hours. No results for input(s): "LIPASE", "AMYLASE" in the last 168 hours. No results for input(s): "AMMONIA" in the last 168 hours. Coagulation Profile: No results for input(s): "INR", "PROTIME" in the last 168 hours. Cardiac Enzymes: No results for input(s): "CKTOTAL", "CKMB", "CKMBINDEX", "TROPONINI" in the last 168 hours. BNP (last 3 results) No results for input(s): "PROBNP" in the last 8760 hours. HbA1C: No results for input(s): "HGBA1C" in the last 72 hours. CBG: No results for input(s): "GLUCAP" in the last 168 hours. Lipid Profile: No results for input(s): "CHOL", "HDL", "LDLCALC", "TRIG", "CHOLHDL", "LDLDIRECT" in the last 72 hours. Thyroid Function Tests: No results for input(s): "TSH", "T4TOTAL", "FREET4", "T3FREE", "THYROIDAB" in the last 72 hours. Anemia Panel: Recent Labs    03/10/23 0438  FERRITIN 41   Sepsis Labs: No results for input(s): "PROCALCITON", "LATICACIDVEN" in the last 168 hours.  No results found for this or any previous visit (from the past 240 hours).       Radiology Studies: No results found.      Scheduled Meds:  allopurinol  100 mg Oral Daily   aspirin EC  81 mg Oral Daily   cholecalciferol  4,000 Units Oral Daily   docusate sodium  200 mg Oral BID   escitalopram  20 mg Oral Daily   ezetimibe  10 mg Oral Daily   heparin injection (subcutaneous)  5,000 Units Subcutaneous Q8H   hydrALAZINE  10 mg Oral Q8H   isosorbide mononitrate  60 mg Oral Daily   lidocaine  2 patch  Transdermal Q24H   loratadine  10 mg Oral Daily   metoprolol succinate  25 mg Oral Daily   mirtazapine  15 mg Oral QHS   pantoprazole  40 mg Oral BID   polyethylene glycol  17 g Oral Daily   pravastatin  40 mg Oral q1800   QUEtiapine  25 mg Oral QHS   sodium chloride flush  3 mL Intravenous Q12H   sodium chloride flush  3 mL Intravenous Q12H   torsemide  40 mg Oral Daily   Continuous Infusions:  promethazine (PHENERGAN) injection (IM or IVPB) 12.5 mg (03/10/23 0933)     LOS: 25 days      Charise Killian, MD [facility]  Pager [phone]  If 7PM-7AM, please contact night-coverage [email or web address] 03/11/2023, 11:07 AM

## 2023-03-12 DIAGNOSIS — I5033 Acute on chronic diastolic (congestive) heart failure: Secondary | ICD-10-CM | POA: Diagnosis not present

## 2023-03-12 LAB — CBC
HCT: 26.7 % — ABNORMAL LOW (ref 39.0–52.0)
Hemoglobin: 8.3 g/dL — ABNORMAL LOW (ref 13.0–17.0)
MCH: 28.7 pg (ref 26.0–34.0)
MCHC: 31.1 g/dL (ref 30.0–36.0)
MCV: 92.4 fL (ref 80.0–100.0)
Platelets: 254 10*3/uL (ref 150–400)
RBC: 2.89 MIL/uL — ABNORMAL LOW (ref 4.22–5.81)
RDW: 17.1 % — ABNORMAL HIGH (ref 11.5–15.5)
WBC: 7.2 10*3/uL (ref 4.0–10.5)
nRBC: 0 % (ref 0.0–0.2)

## 2023-03-12 LAB — BASIC METABOLIC PANEL
Anion gap: 12 (ref 5–15)
BUN: 33 mg/dL — ABNORMAL HIGH (ref 8–23)
CO2: 28 mmol/L (ref 22–32)
Calcium: 8.8 mg/dL — ABNORMAL LOW (ref 8.9–10.3)
Chloride: 97 mmol/L — ABNORMAL LOW (ref 98–111)
Creatinine, Ser: 2.21 mg/dL — ABNORMAL HIGH (ref 0.61–1.24)
GFR, Estimated: 30 mL/min — ABNORMAL LOW (ref >60)
Glucose, Bld: 121 mg/dL — ABNORMAL HIGH (ref 70–99)
Potassium: 3.5 mmol/L (ref 3.5–5.1)
Sodium: 137 mmol/L (ref 135–145)

## 2023-03-12 MED ORDER — CHLORHEXIDINE GLUCONATE CLOTH 2 % EX PADS
6.0000 | MEDICATED_PAD | Freq: Every day | CUTANEOUS | Status: DC
  Administered 2023-03-13 – 2023-03-15 (×3): 6 via TOPICAL

## 2023-03-12 NOTE — Progress Notes (Signed)
PROGRESS NOTE    Tony Williams  ZOX:[id number] DOB: 12-29-1945 DOA: 02/14/2023 PCP: Erasmo Downer, MD   Assessment & Plan:   Principal Problem:   NSTEMI (non-ST elevated myocardial infarction) [facility]) Active Problems:   Acute kidney injury superimposed on CKD (HCC)   Acute on chronic systolic CHF (congestive heart failure) (HCC)   Acute on chronic respiratory failure with hypoxia (HCC)   Drop in hemoglobin   Chronic obstructive pulmonary disease (HCC)   Hypertension associated with diabetes (HCC)   Hyperlipidemia associated with type 2 diabetes mellitus (HCC)   Type II diabetes mellitus with renal manifestations (HCC)   CAD (coronary artery disease)   Myocardial injury   Blurry vision, bilateral   Chronic gout due to renal impairment without tophus   Depression with anxiety   OSA (obstructive sleep apnea)   Obesity, Class III, BMI 40-49.9 (morbid obesity) (HCC)   Dilated cardiomyopathy (HCC)   Acute delirium   Nausea   Acute on chronic congestive heart failure (HCC)  Assessment and Plan: Acute on chronic systolic CHF: last echo shows an EF of 30 to 35%. Continue on imdur, torsemide, metoprolol, hydralazine. Cardio recs apprec   AKI on CKDIIIb: Cr is trending down from day prior. Avoid nephrotoxic meds     Acute on chronic hypoxic respiratory failure: improved respiratory status today. S/p IV lasix x1 on 03/11/23. Continue on supplemental oxygen    Acute blood loss anemia: H&H are trending up today. Will transfuse if Hb < 7.0    Urinary retention: s/p suprapubic cath place by uro 12/14.  Outpatient referral for Dr. Caryl Pina in [facility] was placed by inpatient uro    Blurry vision: bilateral. D/c toviaz    COPD: w/o acute exacerbation. Bronchodilators prn    DM2: HbA1c 6.8, well controlled. Diet controlled    Depression: severity unknown. Continue on home dose of lexapro    OSA: pt refuses to use his CPAP here at the hospital    Morbid obesity: BMI  39.8. Complicates overall care & prognosis    Acute delirium: continue on seroquel    Constipation: lactulose prn       DVT prophylaxis: (heparin SQ Code Status: full  Family Communication: discussed pt's care w/ pt's family at bedside and answered his questions  Disposition Plan: d/c to SNF. Can d/c to SNF on 03/14/23  Status is: Inpatient Remains inpatient appropriate because: much improved today. Can d/c to SNF on 03/14/23    Level of care: Telemetry Cardiac Consultants:   Nephro  cardio  Procedures:   Antimicrobials:    Subjective: Pt c/o fatigue but much improved from day prior   Objective: Vitals:   03/12/23 0022 03/12/23 0442 03/12/23 0500 03/12/23 0833  BP: (!) 121/58 (!) 127/58  (!) 126/50  Pulse: 75 75  71  Resp: 19 19  17   Temp: 98.7 F (37.1 C) 98.2 F (36.8 C)  98.4 F (36.9 C)  TempSrc: Oral Oral  Oral  SpO2: 100% 100%  99%  Weight:   127.7 kg   Height:        Intake/Output Summary (Last 24 hours) at 03/12/2023 0847 Last data filed at 03/12/2023 8119 Gross per 24 hour  Intake 200 ml  Output 2650 ml  Net -2450 ml   Filed Weights   03/11/23 0500 03/11/23 1000 03/12/23 0500  Weight: 125.1 kg 127.8 kg 127.7 kg    Examination:  General exam: Appears comfortable  Respiratory system: clear breath sounds b/l  Cardiovascular system: S1/S2+.  No rubs or clicks  Gastrointestinal system:  abd is soft, NT, obese & hypoactive bowel sounds  Central nervous system: alert & awake. Moves all extremities  Psychiatry: judgement and insight appears at baseline. Flat mood and affect    Data Reviewed: I have personally reviewed following labs and imaging studies  CBC: Recent Labs  Lab 03/06/23 0427 03/07/23 0434 03/10/23 0438 03/10/23 1335 03/11/23 0621 03/12/23 0434  WBC 8.7 9.1 7.3  --   --  7.2  HGB 8.5* 9.0* 8.2* 8.7* 7.8* 8.3*  HCT 27.0* 29.1* 26.5*  --   --  26.7*  MCV 90.0 90.4 94.0  --   --  92.4  PLT 321 336 269  --   --  254    Basic Metabolic Panel: Recent Labs  Lab 03/08/23 0422 03/09/23 0727 03/10/23 0438 03/11/23 0621 03/12/23 0434  NA 133* 134* 134* 136 137  K 3.4* 4.0 3.6 3.7 3.5  CL 95* 97* 96* 99 97*  CO2 27 26 28 29 28   GLUCOSE 130* 144* 127* 107* 121*  BUN 38* 35* 33* 33* 33*  CREATININE 2.00* 1.95* 2.08* 2.34* 2.21*  CALCIUM 8.8* 9.0 8.9 8.7* 8.8*   GFR: Estimated Creatinine Clearance: 38.2 mL/min (A) (by C-G formula based on SCr of 2.21 mg/dL (H)). Liver Function Tests: No results for input(s): "AST", "ALT", "ALKPHOS", "BILITOT", "PROT", "ALBUMIN" in the last 168 hours. No results for input(s): "LIPASE", "AMYLASE" in the last 168 hours. No results for input(s): "AMMONIA" in the last 168 hours. Coagulation Profile: No results for input(s): "INR", "PROTIME" in the last 168 hours. Cardiac Enzymes: No results for input(s): "CKTOTAL", "CKMB", "CKMBINDEX", "TROPONINI" in the last 168 hours. BNP (last 3 results) No results for input(s): "PROBNP" in the last 8760 hours. HbA1C: No results for input(s): "HGBA1C" in the last 72 hours. CBG: No results for input(s): "GLUCAP" in the last 168 hours. Lipid Profile: No results for input(s): "CHOL", "HDL", "LDLCALC", "TRIG", "CHOLHDL", "LDLDIRECT" in the last 72 hours. Thyroid Function Tests: No results for input(s): "TSH", "T4TOTAL", "FREET4", "T3FREE", "THYROIDAB" in the last 72 hours. Anemia Panel: Recent Labs    03/10/23 0438  FERRITIN 41   Sepsis Labs: No results for input(s): "PROCALCITON", "LATICACIDVEN" in the last 168 hours.  No results found for this or any previous visit (from the past 240 hours).       Radiology Studies: DG Chest Port 1 View Result Date: 03/11/2023 CLINICAL DATA:  Shortness of breath. EXAM: PORTABLE CHEST 1 VIEW COMPARISON:  Chest radiograph dated 02/14/2023. FINDINGS: There is cardiomegaly with vascular congestion. Similar appearance of right lung base atelectasis/scarring. Pneumonia is not excluded. No  pneumothorax. Median sternotomy wires. No acute osseous pathology. IMPRESSION: 1. Cardiomegaly with vascular congestion. 2. Right lung base atelectasis/scarring. Electronically Signed   By: Elgie Collard M.D.   On: 03/11/2023 16:40        Scheduled Meds:  allopurinol  100 mg Oral Daily   aspirin EC  81 mg Oral Daily   cholecalciferol  4,000 Units Oral Daily   docusate sodium  200 mg Oral BID   escitalopram  20 mg Oral Daily   ezetimibe  10 mg Oral Daily   heparin injection (subcutaneous)  5,000 Units Subcutaneous Q8H   hydrALAZINE  10 mg Oral Q8H   isosorbide mononitrate  60 mg Oral Daily   lidocaine  2 patch Transdermal Q24H   loratadine  10 mg Oral Daily   metoprolol succinate  25 mg Oral Daily   mirtazapine  15 mg Oral QHS   pantoprazole  40 mg Oral BID   polyethylene glycol  17 g Oral Daily   pravastatin  40 mg Oral q1800   QUEtiapine  25 mg Oral QHS   sodium chloride flush  3 mL Intravenous Q12H   sodium chloride flush  3 mL Intravenous Q12H   torsemide  40 mg Oral Daily   Continuous Infusions:     LOS: 26 days      Charise Killian, MD [facility] Pager [phone]  If 7PM-7AM, please contact night-coverage [email or web address] 03/12/2023, 8:47 AM

## 2023-03-13 DIAGNOSIS — I214 Non-ST elevation (NSTEMI) myocardial infarction: Secondary | ICD-10-CM | POA: Diagnosis not present

## 2023-03-13 LAB — CBC
HCT: 25.9 % — ABNORMAL LOW (ref 39.0–52.0)
Hemoglobin: 8.1 g/dL — ABNORMAL LOW (ref 13.0–17.0)
MCH: 28.5 pg (ref 26.0–34.0)
MCHC: 31.3 g/dL (ref 30.0–36.0)
MCV: 91.2 fL (ref 80.0–100.0)
Platelets: 248 10*3/uL (ref 150–400)
RBC: 2.84 MIL/uL — ABNORMAL LOW (ref 4.22–5.81)
RDW: 17.1 % — ABNORMAL HIGH (ref 11.5–15.5)
WBC: 7.5 10*3/uL (ref 4.0–10.5)
nRBC: 0 % (ref 0.0–0.2)

## 2023-03-13 LAB — BASIC METABOLIC PANEL
Anion gap: 12 (ref 5–15)
BUN: 31 mg/dL — ABNORMAL HIGH (ref 8–23)
CO2: 27 mmol/L (ref 22–32)
Calcium: 8.3 mg/dL — ABNORMAL LOW (ref 8.9–10.3)
Chloride: 97 mmol/L — ABNORMAL LOW (ref 98–111)
Creatinine, Ser: 2.48 mg/dL — ABNORMAL HIGH (ref 0.61–1.24)
GFR, Estimated: 26 mL/min — ABNORMAL LOW (ref >60)
Glucose, Bld: 132 mg/dL — ABNORMAL HIGH (ref 70–99)
Potassium: 3.3 mmol/L — ABNORMAL LOW (ref 3.5–5.1)
Sodium: 136 mmol/L (ref 135–145)

## 2023-03-13 LAB — GLUCOSE, CAPILLARY: Glucose-Capillary: 159 mg/dL — ABNORMAL HIGH (ref 70–99)

## 2023-03-13 MED ORDER — CALCIUM CARBONATE ANTACID 500 MG PO CHEW
1.0000 | CHEWABLE_TABLET | Freq: Three times a day (TID) | ORAL | Status: DC | PRN
  Administered 2023-03-13 – 2023-03-14 (×2): 200 mg via ORAL
  Filled 2023-03-13 (×2): qty 1

## 2023-03-13 NOTE — Plan of Care (Signed)

## 2023-03-13 NOTE — Plan of Care (Signed)
°  Problem: Coping: Goal: Ability to adjust to condition or change in health will improve Outcome: Progressing   Problem: Fluid Volume: Goal: Ability to maintain a balanced intake and output will improve Outcome: Progressing   Problem: Health Behavior/Discharge Planning: Goal: Ability to identify and utilize available resources and services will improve Outcome: Progressing   Problem: Health Behavior/Discharge Planning: Goal: Ability to manage health-related needs will improve Outcome: Progressing   Problem: Nutritional: Goal: Maintenance of adequate nutrition will improve Outcome: Progressing   Problem: Skin Integrity: Goal: Risk for impaired skin integrity will decrease Outcome: Progressing   Problem: Tissue Perfusion: Goal: Adequacy of tissue perfusion will improve Outcome: Progressing   Problem: Activity: Goal: Capacity to carry out activities will improve Outcome: Progressing   Problem: Pain Management: Goal: General experience of comfort will improve Outcome: Progressing

## 2023-03-13 NOTE — Progress Notes (Signed)
PROGRESS NOTE    Tony Williams  ZOX:[id number] DOB: Apr 15, 1945 DOA: 02/14/2023 PCP: Erasmo Downer, MD   Assessment & Plan:   Principal Problem:   NSTEMI (non-ST elevated myocardial infarction) [facility]) Active Problems:   Acute kidney injury superimposed on CKD (HCC)   Acute on chronic systolic CHF (congestive heart failure) (HCC)   Acute on chronic respiratory failure with hypoxia (HCC)   Drop in hemoglobin   Chronic obstructive pulmonary disease (HCC)   Hypertension associated with diabetes (HCC)   Hyperlipidemia associated with type 2 diabetes mellitus (HCC)   Type II diabetes mellitus with renal manifestations (HCC)   CAD (coronary artery disease)   Myocardial injury   Blurry vision, bilateral   Chronic gout due to renal impairment without tophus   Depression with anxiety   OSA (obstructive sleep apnea)   Obesity, Class III, BMI 40-49.9 (morbid obesity) (HCC)   Dilated cardiomyopathy (HCC)   Acute delirium   Nausea   Acute on chronic congestive heart failure (HCC)  Assessment and Plan: Acute on chronic systolic CHF: last echo shows an EF of 30 to 35%. Continue on metoprolol, hydralazine, imdur, torsemide. Cardio recs apprec  AKI on CKDIIIb: Cr is labile. Avoid nephrotoxic meds    Acute on chronic hypoxic respiratory failure: continue on supplemental oxygen   Acute blood loss anemia: H&H are labile. Will transfuse if Hb < 7.0    Urinary retention: s/p suprapubic cath place by uro 12/14.  Outpatient referral for Dr. Caryl Pina in [facility] was placed by inpatient uro    Blurry vision: bilateral. D/c toviaz    COPD: w/o acute exacerbation. Bronchodilators prn    DM2: HbA1c 6.8, well controlled. Diet controlled    Depression: severity unknown. Continue on home dose of lexapro    OSA: pt refuses to use his CPAP here at the hospital    Morbid obesity: BMI 39.8. Complicates overall care & prognosis    Acute delirium: continue on seroquel     Constipation: lactulose prn       DVT prophylaxis: heparin SQ Code Status: full  Family Communication: Disposition Plan: d/c to SNF. Can d/c to SNF on 03/14/23  Status is: Inpatient Remains inpatient appropriate because: can possibly d/c to SNF tomorrow     Level of care: Telemetry Cardiac Consultants:   Nephro  cardio  Procedures:   Antimicrobials:    Subjective: Pt c/o malaise   Objective: Vitals:   03/12/23 2039 03/13/23 0015 03/13/23 0417 03/13/23 0418  BP: 121/60 (!) 110/52  (!) 126/55  Pulse: 80 78  75  Resp: 18 16  16   Temp: 98.3 F (36.8 C) 98 F (36.7 C)  98.3 F (36.8 C)  TempSrc:  Oral  Oral  SpO2: 98% 99%  100%  Weight:   123.5 kg   Height:        Intake/Output Summary (Last 24 hours) at 03/13/2023 0827 Last data filed at 03/13/2023 0400 Gross per 24 hour  Intake 1083 ml  Output 1400 ml  Net -317 ml   Filed Weights   03/11/23 1000 03/12/23 0500 03/13/23 0417  Weight: 127.8 kg 127.7 kg 123.5 kg    Examination:  General exam: appears calm & comfortable  Respiratory system: decreased breath sounds b/l  Cardiovascular system: S1 & S2+. Gastrointestinal system:  abd is soft, NT, obese & hypoactive bowel sounds  Central nervous system: alert & awake. Moves all extremities  Psychiatry: judgement and insight appears at baseline. Flat mood and affect  Data Reviewed: I have personally reviewed following labs and imaging studies  CBC: Recent Labs  Lab 03/07/23 0434 03/10/23 0438 03/10/23 1335 03/11/23 0621 03/12/23 0434 03/13/23 0407  WBC 9.1 7.3  --   --  7.2 7.5  HGB 9.0* 8.2* 8.7* 7.8* 8.3* 8.1*  HCT 29.1* 26.5*  --   --  26.7* 25.9*  MCV 90.4 94.0  --   --  92.4 91.2  PLT 336 269  --   --  254 248   Basic Metabolic Panel: Recent Labs  Lab 03/09/23 0727 03/10/23 0438 03/11/23 0621 03/12/23 0434 03/13/23 0407  NA 134* 134* 136 137 136  K 4.0 3.6 3.7 3.5 3.3*  CL 97* 96* 99 97* 97*  CO2 26 28 29 28 27   GLUCOSE  144* 127* 107* 121* 132*  BUN 35* 33* 33* 33* 31*  CREATININE 1.95* 2.08* 2.34* 2.21* 2.48*  CALCIUM 9.0 8.9 8.7* 8.8* 8.3*   GFR: Estimated Creatinine Clearance: 33.4 mL/min (A) (by C-G formula based on SCr of 2.48 mg/dL (H)). Liver Function Tests: No results for input(s): "AST", "ALT", "ALKPHOS", "BILITOT", "PROT", "ALBUMIN" in the last 168 hours. No results for input(s): "LIPASE", "AMYLASE" in the last 168 hours. No results for input(s): "AMMONIA" in the last 168 hours. Coagulation Profile: No results for input(s): "INR", "PROTIME" in the last 168 hours. Cardiac Enzymes: No results for input(s): "CKTOTAL", "CKMB", "CKMBINDEX", "TROPONINI" in the last 168 hours. BNP (last 3 results) No results for input(s): "PROBNP" in the last 8760 hours. HbA1C: No results for input(s): "HGBA1C" in the last 72 hours. CBG: No results for input(s): "GLUCAP" in the last 168 hours. Lipid Profile: No results for input(s): "CHOL", "HDL", "LDLCALC", "TRIG", "CHOLHDL", "LDLDIRECT" in the last 72 hours. Thyroid Function Tests: No results for input(s): "TSH", "T4TOTAL", "FREET4", "T3FREE", "THYROIDAB" in the last 72 hours. Anemia Panel: No results for input(s): "VITAMINB12", "FOLATE", "FERRITIN", "TIBC", "IRON", "RETICCTPCT" in the last 72 hours.  Sepsis Labs: No results for input(s): "PROCALCITON", "LATICACIDVEN" in the last 168 hours.  No results found for this or any previous visit (from the past 240 hours).       Radiology Studies: DG Chest Port 1 View Result Date: 03/11/2023 CLINICAL DATA:  Shortness of breath. EXAM: PORTABLE CHEST 1 VIEW COMPARISON:  Chest radiograph dated 02/14/2023. FINDINGS: There is cardiomegaly with vascular congestion. Similar appearance of right lung base atelectasis/scarring. Pneumonia is not excluded. No pneumothorax. Median sternotomy wires. No acute osseous pathology. IMPRESSION: 1. Cardiomegaly with vascular congestion. 2. Right lung base atelectasis/scarring.  Electronically Signed   By: Elgie Collard M.D.   On: 03/11/2023 16:40        Scheduled Meds:  allopurinol  100 mg Oral Daily   aspirin EC  81 mg Oral Daily   Chlorhexidine Gluconate Cloth  6 each Topical Daily   cholecalciferol  4,000 Units Oral Daily   docusate sodium  200 mg Oral BID   escitalopram  20 mg Oral Daily   ezetimibe  10 mg Oral Daily   heparin injection (subcutaneous)  5,000 Units Subcutaneous Q8H   hydrALAZINE  10 mg Oral Q8H   isosorbide mononitrate  60 mg Oral Daily   lidocaine  2 patch Transdermal Q24H   loratadine  10 mg Oral Daily   metoprolol succinate  25 mg Oral Daily   mirtazapine  15 mg Oral QHS   pantoprazole  40 mg Oral BID   polyethylene glycol  17 g Oral Daily   pravastatin  40 mg  Oral q1800   QUEtiapine  25 mg Oral QHS   sodium chloride flush  3 mL Intravenous Q12H   sodium chloride flush  3 mL Intravenous Q12H   torsemide  40 mg Oral Daily   Continuous Infusions:     LOS: 27 days      Charise Killian, MD [facility] Pager [phone]  If 7PM-7AM, please contact night-coverage [email or web address] 03/13/2023, 8:27 AM

## 2023-03-14 ENCOUNTER — Ambulatory Visit: Payer: Medicare Other | Admitting: Family Medicine

## 2023-03-14 DIAGNOSIS — Z7189 Other specified counseling: Secondary | ICD-10-CM

## 2023-03-14 DIAGNOSIS — I214 Non-ST elevation (NSTEMI) myocardial infarction: Secondary | ICD-10-CM | POA: Diagnosis not present

## 2023-03-14 LAB — BASIC METABOLIC PANEL
Anion gap: 10 (ref 5–15)
BUN: 28 mg/dL — ABNORMAL HIGH (ref 8–23)
CO2: 31 mmol/L (ref 22–32)
Calcium: 8.5 mg/dL — ABNORMAL LOW (ref 8.9–10.3)
Chloride: 96 mmol/L — ABNORMAL LOW (ref 98–111)
Creatinine, Ser: 2.34 mg/dL — ABNORMAL HIGH (ref 0.61–1.24)
GFR, Estimated: 28 mL/min — ABNORMAL LOW (ref >60)
Glucose, Bld: 128 mg/dL — ABNORMAL HIGH (ref 70–99)
Potassium: 3.4 mmol/L — ABNORMAL LOW (ref 3.5–5.1)
Sodium: 137 mmol/L (ref 135–145)

## 2023-03-14 LAB — CBC
HCT: 26.5 % — ABNORMAL LOW (ref 39.0–52.0)
Hemoglobin: 8.2 g/dL — ABNORMAL LOW (ref 13.0–17.0)
MCH: 28.3 pg (ref 26.0–34.0)
MCHC: 30.9 g/dL (ref 30.0–36.0)
MCV: 91.4 fL (ref 80.0–100.0)
Platelets: 247 10*3/uL (ref 150–400)
RBC: 2.9 MIL/uL — ABNORMAL LOW (ref 4.22–5.81)
RDW: 17.1 % — ABNORMAL HIGH (ref 11.5–15.5)
WBC: 7.8 10*3/uL (ref 4.0–10.5)
nRBC: 0 % (ref 0.0–0.2)

## 2023-03-14 MED ORDER — MORPHINE SULFATE (PF) 2 MG/ML IV SOLN: 2.0000 mg | INTRAVENOUS | Status: DC | PRN

## 2023-03-14 MED ORDER — FENTANYL CITRATE PF 50 MCG/ML IJ SOSY
12.5000 ug | PREFILLED_SYRINGE | INTRAMUSCULAR | Status: DC | PRN
  Administered 2023-03-14 – 2023-03-15 (×4): 12.5 ug via INTRAVENOUS
  Filled 2023-03-14 (×4): qty 1

## 2023-03-14 MED ORDER — ALUM & MAG HYDROXIDE-SIMETH 200-200-20 MG/5ML PO SUSP
30.0000 mL | ORAL | Status: DC | PRN
  Administered 2023-03-14: 30 mL via ORAL
  Filled 2023-03-14: qty 30

## 2023-03-14 MED ORDER — HYOSCYAMINE SULFATE 0.125 MG SL SUBL
0.2500 mg | SUBLINGUAL_TABLET | Freq: Once | SUBLINGUAL | Status: AC
  Administered 2023-03-14: 0.25 mg via SUBLINGUAL
  Filled 2023-03-14: qty 2

## 2023-03-14 MED ORDER — ALUM & MAG HYDROXIDE-SIMETH 200-200-20 MG/5ML PO SUSP
15.0000 mL | Freq: Four times a day (QID) | ORAL | Status: DC | PRN
  Administered 2023-03-14: 15 mL via ORAL
  Filled 2023-03-14: qty 30

## 2023-03-14 MED ORDER — ALUM & MAG HYDROXIDE-SIMETH 200-200-20 MG/5ML PO SUSP
30.0000 mL | Freq: Once | ORAL | Status: AC
  Administered 2023-03-14: 30 mL via ORAL
  Filled 2023-03-14: qty 30

## 2023-03-14 MED ORDER — MORPHINE SULFATE (PF) 2 MG/ML IV SOLN: 1.0000 mg | INTRAVENOUS | Status: DC | PRN

## 2023-03-14 MED ORDER — DIPHENHYDRAMINE HCL 25 MG PO CAPS: 25.0000 mg | ORAL_CAPSULE | Freq: Four times a day (QID) | ORAL | Status: DC | PRN

## 2023-03-14 NOTE — Plan of Care (Signed)
  Problem: Coping: Goal: Ability to adjust to condition or change in health will improve Outcome: Progressing   Problem: Fluid Volume: Goal: Ability to maintain a balanced intake and output will improve Outcome: Progressing   Problem: Health Behavior/Discharge Planning: Goal: Ability to identify and utilize available resources and services will improve Outcome: Progressing Goal: Ability to manage health-related needs will improve Outcome: Progressing   

## 2023-03-14 NOTE — Progress Notes (Signed)
PROGRESS NOTE    Tony Williams  QVZ:[id number] DOB: 10/07/1945 DOA: 02/14/2023 PCP: Erasmo Downer, MD   Assessment & Plan:   Principal Problem:   NSTEMI (non-ST elevated myocardial infarction) [facility]) Active Problems:   Acute kidney injury superimposed on CKD (HCC)   Acute on chronic systolic CHF (congestive heart failure) (HCC)   Acute on chronic respiratory failure with hypoxia (HCC)   Drop in hemoglobin   Chronic obstructive pulmonary disease (HCC)   Hypertension associated with diabetes (HCC)   Hyperlipidemia associated with type 2 diabetes mellitus (HCC)   Type II diabetes mellitus with renal manifestations (HCC)   CAD (coronary artery disease)   Myocardial injury   Blurry vision, bilateral   Chronic gout due to renal impairment without tophus   Depression with anxiety   OSA (obstructive sleep apnea)   Obesity, Class III, BMI 40-49.9 (morbid obesity) (HCC)   Dilated cardiomyopathy (HCC)   Acute delirium   Nausea   Acute on chronic congestive heart failure (HCC)  Assessment and Plan: Acute on chronic systolic CHF: last echo shows an EF of 30 to 35%. Continue on torsemide, metoprolol, imdur, hydralazine. Cardio recs apprec   AKI on CKDIIIb: Cr is labile. Avoid nephrotoxic meds    Acute on chronic hypoxic respiratory failure: continue on supplemental oxygen    Acute blood loss anemia: H&H are labile. Will transfuse if Hb < 7.0    Urinary retention: s/p suprapubic cath place by uro 12/14.  Outpatient referral for Dr. Caryl Pina in [facility] was placed by inpatient uro    Blurry vision: bilateral. D/c toviaz    COPD: w/o acute exacerbation.Bronchodilators prn    DM2: well controlled, HbA1c 6.8. Diet controlled    Depression: severity unknown. Continue on lexapro    OSA: pt refuses to use his CPAP here at the hospital    Morbid obesity: BMI 39.8. Complicates overall care & prognosis    Acute delirium: continue on seroquel    Constipation:  lactulose prn       DVT prophylaxis: heparin SQ Code Status: full  Family Communication: discussed pt's care w/ pt's daughter at bedside and answered her questions  Disposition Plan: d/c to SNF. Can d/c to SNF on 03/14/23  Status is: Inpatient Remains inpatient appropriate because: can possibly d/c to SNF tomorrow     Level of care: Telemetry Cardiac Consultants:  Palliative care  Nephro  Cardio Uro   Procedures:   Antimicrobials:    Subjective: Pt c/o nausea   Objective: Vitals:   03/13/23 2348 03/14/23 0312 03/14/23 0451 03/14/23 0753  BP: (!) 128/55 130/61  (!) 141/72  Pulse: 68 74  78  Resp: 17 19  19   Temp: 98 F (36.7 C) 97.8 F (36.6 C)  98 F (36.7 C)  TempSrc: Oral Oral    SpO2: 100% 100%  100%  Weight:   123.9 kg   Height:        Intake/Output Summary (Last 24 hours) at 03/14/2023 1118 Last data filed at 03/14/2023 1056 Gross per 24 hour  Intake 240 ml  Output 575 ml  Net -335 ml   Filed Weights   03/12/23 0500 03/13/23 0417 03/14/23 0451  Weight: 127.7 kg 123.5 kg 123.9 kg    Examination:  General exam: appears uncomfortable  Respiratory system: diminished breath sounds b/l  Cardiovascular system: S1/S2+. No rubs or clicks  Gastrointestinal system:  abd is soft, NT, obese & hypoactive bowel sounds  Central nervous system: alert & awake. Moves all  extremities  Psychiatry:judgement and insight appears at baseline. Flat mood and affect    Data Reviewed: I have personally reviewed following labs and imaging studies  CBC: Recent Labs  Lab 03/10/23 0438 03/10/23 1335 03/11/23 0621 03/12/23 0434 03/13/23 0407 03/14/23 0251  WBC 7.3  --   --  7.2 7.5 7.8  HGB 8.2* 8.7* 7.8* 8.3* 8.1* 8.2*  HCT 26.5*  --   --  26.7* 25.9* 26.5*  MCV 94.0  --   --  92.4 91.2 91.4  PLT 269  --   --  254 248 247   Basic Metabolic Panel: Recent Labs  Lab 03/10/23 0438 03/11/23 0621 03/12/23 0434 03/13/23 0407 03/14/23 0251  NA 134* 136 137  136 137  K 3.6 3.7 3.5 3.3* 3.4*  CL 96* 99 97* 97* 96*  CO2 28 29 28 27 31   GLUCOSE 127* 107* 121* 132* 128*  BUN 33* 33* 33* 31* 28*  CREATININE 2.08* 2.34* 2.21* 2.48* 2.34*  CALCIUM 8.9 8.7* 8.8* 8.3* 8.5*   GFR: Estimated Creatinine Clearance: 35.5 mL/min (A) (by C-G formula based on SCr of 2.34 mg/dL (H)). Liver Function Tests: No results for input(s): "AST", "ALT", "ALKPHOS", "BILITOT", "PROT", "ALBUMIN" in the last 168 hours. No results for input(s): "LIPASE", "AMYLASE" in the last 168 hours. No results for input(s): "AMMONIA" in the last 168 hours. Coagulation Profile: No results for input(s): "INR", "PROTIME" in the last 168 hours. Cardiac Enzymes: No results for input(s): "CKTOTAL", "CKMB", "CKMBINDEX", "TROPONINI" in the last 168 hours. BNP (last 3 results) No results for input(s): "PROBNP" in the last 8760 hours. HbA1C: No results for input(s): "HGBA1C" in the last 72 hours. CBG: Recent Labs  Lab 03/13/23 2208  GLUCAP 159*   Lipid Profile: No results for input(s): "CHOL", "HDL", "LDLCALC", "TRIG", "CHOLHDL", "LDLDIRECT" in the last 72 hours. Thyroid Function Tests: No results for input(s): "TSH", "T4TOTAL", "FREET4", "T3FREE", "THYROIDAB" in the last 72 hours. Anemia Panel: No results for input(s): "VITAMINB12", "FOLATE", "FERRITIN", "TIBC", "IRON", "RETICCTPCT" in the last 72 hours.  Sepsis Labs: No results for input(s): "PROCALCITON", "LATICACIDVEN" in the last 168 hours.  No results found for this or any previous visit (from the past 240 hours).       Radiology Studies: No results found.       Scheduled Meds:  allopurinol  100 mg Oral Daily   aspirin EC  81 mg Oral Daily   Chlorhexidine Gluconate Cloth  6 each Topical Daily   cholecalciferol  4,000 Units Oral Daily   docusate sodium  200 mg Oral BID   escitalopram  20 mg Oral Daily   ezetimibe  10 mg Oral Daily   heparin injection (subcutaneous)  5,000 Units Subcutaneous Q8H   hydrALAZINE   10 mg Oral Q8H   isosorbide mononitrate  60 mg Oral Daily   lidocaine  2 patch Transdermal Q24H   loratadine  10 mg Oral Daily   metoprolol succinate  25 mg Oral Daily   mirtazapine  15 mg Oral QHS   pantoprazole  40 mg Oral BID   pravastatin  40 mg Oral q1800   QUEtiapine  25 mg Oral QHS   sodium chloride flush  3 mL Intravenous Q12H   sodium chloride flush  3 mL Intravenous Q12H   torsemide  40 mg Oral Daily   Continuous Infusions:     LOS: 28 days      Charise Killian, MD [facility] Pager [phone]  If 7PM-7AM, please contact  night-coverage [email or web address] 03/14/2023, 11:18 AM

## 2023-03-14 NOTE — Plan of Care (Signed)
  Problem: Coping: Goal: Ability to adjust to condition or change in health will improve Outcome: Progressing   Problem: Skin Integrity: Goal: Risk for impaired skin integrity will decrease Outcome: Progressing   

## 2023-03-14 NOTE — Consult Note (Cosign Needed)
Consultation Note Date: 03/14/2023   Patient Name: Tony Williams  DOB: April 23, 1945  MRN: [id number]  Age / Sex: 77 y.o., male  PCP: Erasmo Downer, MD Referring Physician: Charise Killian, MD  Reason for Consultation: Establishing goals of care  HPI/Patient Profile: Tony Williams is a 77 y.o. male with medical history significant of dCHF, HLD, HTN, DM, COPD on 3L oxygen, CAD (s/p of CABG and stent), gout, CKD-3b, depression with anxiety, morbid obesity, OSA, former smoker, bilateral cancer, who presents with SOB.   Patient states that he has shortness of breath in the past several weeks which has been progressively worsening.  Patient is dry cough, no fever or chills.  He had mild substernal chest pain earlier, which has resolved. Patient also has worsening bilateral lower leg edema and 15 pounds of weight gain recently.  Patient has nausea, no vomiting, diarrhea or abdominal pain.  No symptoms of UTI.  Patient states he has intermittent bilateral blurry vision for more than 3 weeks.  He reports mild numbness in both feet, and generalized weakness.  No unilateral weakness in extremities.  No facial droop or slurred speech.   Clinical Assessment and Goals of Care: Notes and labs reviewed.  In to see patient.  Currently patient is sitting in bed with his daughter at bedside.  He discusses that he lives with his wife.  He and his daughter discussed that quality of life is extremely important to him and that he lives life as he wishes in regards to health recommendations.   We discussed his diagnoses in detail and how they affect each other, prognosis, GOC, EOL wishes disposition and options.  Created space and opportunity for patient  to explore thoughts and feelings regarding current medical information.   A detailed discussion was had today regarding advanced directives.  Concepts specific to code  status, artifical feeding and hydration, IV antibiotics and rehospitalization were discussed.  The difference between an aggressive medical intervention path and a comfort care path was discussed.  Values and goals of care important to patient and family were attempted to be elicited.  Discussed limitations of medical interventions to prolong quality of life in some situations and discussed the concept of human mortality.  We discussed patient's scanned advance directive in great detail, while reading over it.  Patient is considering full comfort care and home with hospice.  Patient and daughter would like to speak with patient's wife prior to making decisions.  We discussed CODE STATUS and patient believes he would want a DNR status but daughter states that upon coming in to the ED, he indicated he would want CPR and so they want to speak with family further prior to changing CODE STATUS.  PMT will follow-up tomorrow.  Patient and family would like to speak with another [place] care liaison.  They advised that patient's son had [facility] prior to his death and they would like to use the same company.    SUMMARY OF RECOMMENDATIONS   Recommend an [facility] care consult to  discuss home with hospice. Patient is considering DNR and transition to full comfort care but would like to speak with family prior to making these changes.   Prognosis:  < 6 weeks       Primary Diagnoses: Present on Admission:  Chronic obstructive pulmonary disease (HCC)  Obesity, Class III, BMI 40-49.9 (morbid obesity) (HCC)  Chronic gout due to renal impairment without tophus  Hypertension associated with diabetes (HCC)  Hyperlipidemia associated with type 2 diabetes mellitus (HCC)  OSA (obstructive sleep apnea)  Type II diabetes mellitus with renal manifestations (HCC)  CAD (coronary artery disease)  Myocardial injury  Depression with anxiety  Acute on chronic respiratory failure with hypoxia (HCC)  Blurry vision,  bilateral  Acute kidney injury superimposed on CKD (HCC)  NSTEMI (non-ST elevated myocardial infarction) (HCC)   I have reviewed the medical record, interviewed the patient and family, and examined the patient. The following aspects are pertinent.  Past Medical History:  Diagnosis Date   Bladder cancer (HCC) 2013   CAD (coronary artery disease)    a. 2007 CABG x 4: LIMA->D1, VG->OM1, VG->D2->mLAD; b. 2018 NSTEMI/PCI: DES to VG->OM; c. 10/2020 NSTEMI/PCI: LM 90d, LAD 150m, D1 100, LCX 100ost/p, RCA sev diff dzs, VG->D2->mLAD 99/90 before D2 (4.0x15 [employer] DES), 90 before mLAD (4.0x15 [employer] DES), LIMA->D1 ok, VG->OM1 100; d. 10/2021 MV: no isch. mod basal to mid inf and antsept infarct.   Chronic heart failure with preserved ejection fraction (HFpEF) (HCC)    a. 10/2020 Echo: EF 50-55%, apical HK, GrII DD, mod dil LA; b. 11/2020 Echo: EF 60-65%, no rwma; c. 10/2021 Echo: EF 50-55%, inf HK, GrI DD, nl RV fxn, mildly dil LA, mild MR.   Chronic kidney disease    COPD (chronic obstructive pulmonary disease) (HCC)    Depression    Diabetes mellitus without complication (HCC)    Hx of CABG x 4    LIMA-D2, SVG-OM (occluded), SeqSVG-D1-LAD.   Labile hypertension    Myocardial infarction [facility])    Social History   Socioeconomic History   Marital status: Married    Spouse name: Not on file   Number of children: 2   Years of education: Not on file   Highest education level: High school graduate  Occupational History   Occupation: retired  Tobacco Use   Smoking status: Former    Current packs/day: 0.00    Average packs/day: 1 pack/day for 30.0 years (30.0 ttl pk-yrs)    Types: Cigarettes    Start date: 10/22/1973    Quit date: 10/23/2003    Years since quitting: 19.4   Smokeless tobacco: Never  Vaping Use   Vaping status: Never Used  Substance and Sexual Activity   Alcohol use: Never   Drug use: Never   Sexual activity: Not Currently  Other Topics Concern   Not on file   Social History Narrative   Not on file   Social Drivers of Health   Financial Resource Strain: Low Risk  (02/18/2023)   Overall Financial Resource Strain (CARDIA)    Difficulty of Paying Living Expenses: Not hard at all  Food Insecurity: No Food Insecurity (02/15/2023)   Hunger Vital Sign    Worried About Running Out of Food in the Last Year: Never true    Ran Out of Food in the Last Year: Never true  Transportation Needs: No Transportation Needs (02/18/2023)   PRAPARE - Administrator, Civil Service (Medical): No    Lack of Transportation (  Non-Medical): No  Physical Activity: Inactive (05/19/2021)   Exercise Vital Sign    Days of Exercise per Week: 0 days    Minutes of Exercise per Session: 0 min  Stress: No Stress Concern Present (05/19/2021)   [employer] of Occupational Health - Occupational Stress Questionnaire    Feeling of Stress : Only a little  Recent Concern: Stress - Stress Concern Present (02/26/2021)   [employer] of Occupational Health - Occupational Stress Questionnaire    Feeling of Stress : To some extent  Social Connections: Unknown (05/19/2021)   Social Connection and Isolation Panel [NHANES]    Frequency of Communication with Friends and Family: Not on file    Frequency of Social Gatherings with Friends and Family: Twice a week    Attends Religious Services: More than 4 times per year    Active Member of [employer] or Organizations: No    Attends Engineer, structural: Never    Marital Status: Married   Family History  Problem Relation Age of Onset   Heart Problems Mother    Heart Problems Father    Scheduled Meds:  allopurinol  100 mg Oral Daily   aspirin EC  81 mg Oral Daily   Chlorhexidine Gluconate Cloth  6 each Topical Daily   cholecalciferol  4,000 Units Oral Daily   docusate sodium  200 mg Oral BID   escitalopram  20 mg Oral Daily   ezetimibe  10 mg Oral Daily   heparin injection (subcutaneous)  5,000 Units Subcutaneous Q8H    hydrALAZINE  10 mg Oral Q8H   hyoscyamine  0.25 mg Sublingual Once   isosorbide mononitrate  60 mg Oral Daily   lidocaine  2 patch Transdermal Q24H   loratadine  10 mg Oral Daily   metoprolol succinate  25 mg Oral Daily   mirtazapine  15 mg Oral QHS   pantoprazole  40 mg Oral BID   pravastatin  40 mg Oral q1800   QUEtiapine  25 mg Oral QHS   sodium chloride flush  3 mL Intravenous Q12H   sodium chloride flush  3 mL Intravenous Q12H   torsemide  40 mg Oral Daily   Continuous Infusions: PRN Meds:.acetaminophen, albuterol, alum & mag hydroxide-simeth, dextromethorphan-guaiFENesin, lactulose, meclizine, nitroGLYCERIN, mouth rinse, oxyCODONE, polyvinyl alcohol, prochlorperazine, sodium chloride, sodium chloride flush Medications Prior to Admission:  Prior to Admission medications   Medication Sig Start Date End Date Taking? Authorizing Provider  acetaminophen (TYLENOL) 325 MG tablet Take 2 tablets (650 mg total) by mouth every 6 (six) hours as needed for mild pain (or Fever >/= 101). 11/12/21  Yes Rodolph Bong, MD  albuterol (PROVENTIL) (2.5 MG/3ML) 0.083% nebulizer solution Take 3 mLs (2.5 mg total) by nebulization every 6 (six) hours as needed for wheezing or shortness of breath. 08/04/22  Yes Coralyn Helling, MD  albuterol (VENTOLIN HFA) 108 (90 Base) MCG/ACT inhaler INHALE 2 PUFFS INTO THE LUNGS EVERY 6 HOURS AS NEEDED FOR WHEEZING OR SHORTNESS OF BREATH 01/08/22  Yes Bacigalupo, Marzella Schlein, MD  allopurinol (ZYLOPRIM) 300 MG tablet Take 1 tablet (300 mg total) by mouth daily. 08/10/22  Yes Bacigalupo, Marzella Schlein, MD  amLODipine (NORVASC) 10 MG tablet TAKE 1 TABLET(10 MG) BY MOUTH DAILY 09/20/22  Yes Bacigalupo, Marzella Schlein, MD  aspirin EC 81 MG tablet Take 81 mg by mouth daily. Swallow whole.   Yes [provider]  Budeson-Glycopyrrol-Formoterol (BREZTRI AEROSPHERE) 160-9-4.8 MCG/ACT AERO Inhale 2 puffs into the lungs 2 (two) times daily. 01/27/21  Yes Parrett, Tammy S, NP  carvedilol  (COREG) 3.125 MG tablet Take 3.125 mg by mouth 2 (two) times daily. 11/18/22  Yes [provider]  Cholecalciferol (VITAMIN D3 PO) Take 4,000 Units by mouth.   Yes [provider]  cloNIDine (CATAPRES) 0.1 MG tablet Take 0.1 mg by mouth daily.   Yes [provider]  clopidogrel (PLAVIX) 75 MG tablet TAKE 1 TABLET(75 MG) BY MOUTH DAILY 01/17/23  Yes Bacigalupo, Marzella Schlein, MD  Coenzyme Q10 (CO Q-10) 400 MG CAPS Take 400 mg by mouth daily at 6 (six) AM.   Yes [provider]  colchicine 0.6 MG tablet Take 1 tablet (0.6 mg total) by mouth 2 (two) times daily. 05/31/22  Yes Merita Norton T, FNP  desoximetasone (TOPICORT) 0.25 % cream APPLY TOPICALLY TO THE AFFECTED AREA TWICE DAILY 09/21/22  Yes Bacigalupo, Marzella Schlein, MD  escitalopram (LEXAPRO) 20 MG tablet TAKE 1 TABLET(20 MG) BY MOUTH DAILY 01/21/23  Yes Bacigalupo, Marzella Schlein, MD  fenofibrate (TRICOR) 145 MG tablet Take 1 tablet (145 mg total) by mouth daily. 07/24/21  Yes Agbor-Etang, Arlys John, MD  gabapentin (NEURONTIN) 300 MG capsule Take 300 mg by mouth 3 (three) times daily.   Yes [provider]  glimepiride (AMARYL) 2 MG tablet Take 1 tablet (2 mg total) by mouth daily with breakfast. TAKE 1 TABLET(1 MG) BY MOUTH DAILY WITH BREAKFAST 08/10/22  Yes Bacigalupo, Marzella Schlein, MD  hydrALAZINE (APRESOLINE) 100 MG tablet TAKE 1 TABLET(100 MG) BY MOUTH THREE TIMES DAILY 01/21/23  Yes Bacigalupo, Marzella Schlein, MD  HYDROcodone-acetaminophen (NORCO/VICODIN) 5-325 MG tablet Take 1 tablet by mouth every 6 (six) hours as needed for moderate pain (pain score 4-6). 01/31/23  Yes Bacigalupo, Marzella Schlein, MD  isosorbide mononitrate (IMDUR) 30 MG 24 hr tablet Take 1 tablet (30 mg total) by mouth daily. 06/29/22  Yes Furth, Cadence H, PA-C  loratadine (CLARITIN) 10 MG tablet Take 1 tablet (10 mg total) by mouth daily. 11/12/21  Yes Rodolph Bong, MD  lovastatin (MEVACOR) 40 MG tablet TAKE 1 TABLET(40 MG) BY MOUTH DAILY 01/21/23  Yes Bacigalupo,  Marzella Schlein, MD  metolazone (ZAROXOLYN) 2.5 MG tablet Take 2.5 mg by mouth every Wednesday. 12/21/22 12/21/23 Yes [provider]  nitroGLYCERIN (NITROSTAT) 0.4 MG SL tablet Place 1 tablet (0.4 mg total) under the tongue every 5 (five) minutes as needed for chest pain. 10/26/21 02/14/23 Yes Agbor-Etang, Arlys John, MD  Omega-3 Fatty Acids (FISH OIL) 1200 MG CAPS Take 1,200 mg by mouth daily at 6 (six) AM.   Yes [provider]  pregabalin (LYRICA) 100 MG capsule Take 1 capsule (100 mg total) by mouth 2 (two) times daily. 11/11/22  Yes Bacigalupo, Marzella Schlein, MD  torsemide (DEMADEX) 20 MG tablet Take 1 tablet (20 mg total) by mouth daily. 12/15/22  Yes Agbor-Etang, Arlys John, MD  traZODone (DESYREL) 150 MG tablet TAKE 1 TABLET(150 MG) BY MOUTH AT BEDTIME 12/15/22  Yes Bacigalupo, Marzella Schlein, MD  oxyCODONE (ROXICODONE) 5 MG immediate release tablet Take 1 tablet (5 mg total) by mouth every 8 (eight) hours as needed. Patient not taking: Reported on 02/14/2023 02/05/23 02/05/24  Poggi, Herb Grays, PA-C   Allergies  Allergen Reactions   Morphine Itching    Redness around IV site   Dilaudid [Hydromorphone] Hives    Rash and itch   Butorphanol Itching   Pedi-Pre Tape Spray [Wound Dressing Adhesive] Itching   Statins Other (See Comments)    "Destroyed my muscles"   Zocor [Simvastatin] Other (  See Comments)    "Destroyed my muscles"   Review of Systems  Constitutional:  Positive for activity change.    Physical Exam Pulmonary:     Effort: Pulmonary effort is normal.  Neurological:     Mental Status: He is alert.     Vital Signs: BP (!) 153/80 (BP Location: Right Wrist)   Pulse 82   Temp 98.1 F (36.7 C)   Resp 19   Ht 5\' 10"  (1.778 m)   Wt 123.9 kg   SpO2 100%   BMI 39.19 kg/m  Pain Scale: 0-10 POSS *See Group Information*: S-Acceptable,Sleep, easy to arouse Pain Score: 2    SpO2: SpO2: 100 % O2 Device:SpO2: 100 % O2 Flow Rate: .O2 Flow Rate (L/min): 4 L/min  IO: Intake/output  summary:  Intake/Output Summary (Last 24 hours) at 03/14/2023 1603 Last data filed at 03/14/2023 1408 Gross per 24 hour  Intake 240 ml  Output 1475 ml  Net -1235 ml    LBM: Last BM Date : 03/14/23 Baseline Weight: Weight: (!) 142.9 kg Most recent weight: Weight: 123.9 kg    Signed by: Morton Stall, NP   Please contact Palliative Medicine Team phone at [phone] for questions and concerns.  For individual provider: See Loretha Stapler

## 2023-03-14 NOTE — Progress Notes (Signed)
[facility]- Civil engineer, contracting  Received a referral to speak with patient and family about Hospice services.  Left a voice mail message with patient's daughter, Lamel Hayenga, to call to discuss services.  Waiting for a return phone call.     Please don't hesitate to call with any Hospice related questions or concerns.    Thank you for the opportunity to participate in this patient's care. [facility] Liaison [phone]

## 2023-03-14 NOTE — TOC Progression Note (Signed)
Transition of Care [facility]) - Progression Note    Patient Details  Name: Tony Williams MRN: [id number] Date of Birth: [birth year]  Transition of Care [facility]) CM/SW Contact  Margarito Liner, LCSW Phone Number: 03/14/2023, 12:34 PM  Clinical Narrative: Per MD and daughter, patient is not medically stable for discharge today. CSW left message for [facility] SNF admissions coordinator to notify.    Expected Discharge Plan and Services                                               Social Determinants of Health (SDOH) Interventions SDOH Screenings   Food Insecurity: No Food Insecurity (02/15/2023)  Housing: Low Risk  (02/24/2023)  Transportation Needs: No Transportation Needs (02/18/2023)  Utilities: Not At Risk (02/15/2023)  Alcohol Screen: Low Risk  (08/10/2022)  Depression (PHQ2-9): Low Risk  (08/10/2022)  Financial Resource Strain: Low Risk  (02/18/2023)  Physical Activity: Inactive (05/19/2021)  Social Connections: Unknown (05/19/2021)  Stress: No Stress Concern Present (05/19/2021)  Recent Concern: Stress - Stress Concern Present (02/26/2021)  Tobacco Use: Medium Risk (02/18/2023)    Readmission Risk Interventions    02/15/2023    9:37 AM 12/11/2020   11:06 AM  Readmission Risk Prevention Plan  Transportation Screening Complete Complete  PCP or Specialist Appt within 5-7 Days  Complete  PCP or Specialist Appt within 3-5 Days Complete   Medication Review (RN CM)  Complete  HRI or Home Care Consult Complete   Social Work Consult for Recovery Care Planning/Counseling Complete   Palliative Care Screening Not Applicable   Medication Review Oceanographer) Not Complete   Med Review Comments patient will discuss medication upon discharge

## 2023-03-15 DIAGNOSIS — I214 Non-ST elevation (NSTEMI) myocardial infarction: Secondary | ICD-10-CM | POA: Diagnosis not present

## 2023-03-15 LAB — BASIC METABOLIC PANEL
Anion gap: 11 (ref 5–15)
BUN: 26 mg/dL — ABNORMAL HIGH (ref 8–23)
CO2: 32 mmol/L (ref 22–32)
Calcium: 8.8 mg/dL — ABNORMAL LOW (ref 8.9–10.3)
Chloride: 94 mmol/L — ABNORMAL LOW (ref 98–111)
Creatinine, Ser: 2.26 mg/dL — ABNORMAL HIGH (ref 0.61–1.24)
GFR, Estimated: 29 mL/min — ABNORMAL LOW (ref >60)
Glucose, Bld: 134 mg/dL — ABNORMAL HIGH (ref 70–99)
Potassium: 3.8 mmol/L (ref 3.5–5.1)
Sodium: 137 mmol/L (ref 135–145)

## 2023-03-15 LAB — CBC
HCT: 28.9 % — ABNORMAL LOW (ref 39.0–52.0)
Hemoglobin: 8.8 g/dL — ABNORMAL LOW (ref 13.0–17.0)
MCH: 28.7 pg (ref 26.0–34.0)
MCHC: 30.4 g/dL (ref 30.0–36.0)
MCV: 94.1 fL (ref 80.0–100.0)
Platelets: 244 10*3/uL (ref 150–400)
RBC: 3.07 MIL/uL — ABNORMAL LOW (ref 4.22–5.81)
RDW: 17.3 % — ABNORMAL HIGH (ref 11.5–15.5)
WBC: 6.4 10*3/uL (ref 4.0–10.5)
nRBC: 0 % (ref 0.0–0.2)

## 2023-03-15 MED ORDER — ISOSORBIDE MONONITRATE ER 60 MG PO TB24: 60.0000 mg | ORAL_TABLET | Freq: Every day | ORAL | 0 refills | Status: AC

## 2023-03-15 MED ORDER — TORSEMIDE 40 MG PO TABS: 40.0000 mg | ORAL_TABLET | Freq: Every day | ORAL | 0 refills | Status: AC

## 2023-03-15 MED ORDER — FENTANYL 12 MCG/HR TD PT72: 1.0000 | MEDICATED_PATCH | TRANSDERMAL | 0 refills | Status: DC

## 2023-03-15 MED ORDER — OXYCODONE HCL 10 MG PO TABS: 10.0000 mg | ORAL_TABLET | ORAL | 0 refills | Status: AC | PRN

## 2023-03-15 MED ORDER — FENTANYL 12 MCG/HR TD PT72: 1.0000 | MEDICATED_PATCH | TRANSDERMAL | 0 refills | Status: AC

## 2023-03-15 MED ORDER — HYDRALAZINE HCL 10 MG PO TABS: 10.0000 mg | ORAL_TABLET | Freq: Three times a day (TID) | ORAL | 0 refills | Status: AC

## 2023-03-15 MED ORDER — PROCHLORPERAZINE MALEATE 5 MG PO TABS: 5.0000 mg | ORAL_TABLET | Freq: Four times a day (QID) | ORAL | 0 refills | Status: AC | PRN

## 2023-03-15 MED ORDER — CEFDINIR 300 MG PO CAPS: 300.0000 mg | ORAL_CAPSULE | Freq: Two times a day (BID) | ORAL | 0 refills | Status: AC

## 2023-03-15 NOTE — Progress Notes (Signed)
 [facility], Room  [facility]) Hospital Liaison Note  Received request from Lauraine Carpen, LCSW , Transitions of Care Manager, for hospice services at home after discharge.  Spoke with patient and family  to initiate education related to hospice philosophy, services, and team approach to care.  Patient/family verbalized understanding of information given.  Per discussion, the plan is for discharge home by EMS today.    DME needs discussed.  Patient has the following equipment in the home: Walker, CPAP and home 02.   Patient/family requests the following equipment in the home: Hospital bed, over the bed table, [facility] and Wheelchair The address has been verified and is correct in the chart. Praveen Coia and phone number [phone] is the family contact to arrange time of equipment delivery.    Please send signed and completed DNR home with the patient/family.  Please provide prescriptions at discharge as needed to ensure ongoing symptom management.   [facility] information and contact numbers given to daughter Above information shared with Transitions of Care Manager.     Please call with any Hospice related questions or concerns.  Thank you for the opportunity to participate in this patient's care.  Marinell Nova, [facility] Liaison [phone]

## 2023-03-15 NOTE — Progress Notes (Signed)
 PT Cancellation Note  Patient Details Name: Tony Williams MRN: [id number] DOB: 1945/07/28   Cancelled Treatment:    Reason Eval/Treat Not Completed: Patient declined to participate with PT services this date.  Pt stated that he is discharging home with hospice today and does not wish to participate with therapy at this time.  Will attempt to see pt at a future date/time as medically appropriate.    CHARM Glendia Bertin PT, DPT 03/15/23, 10:02 AM

## 2023-03-15 NOTE — TOC Transition Note (Signed)
 Transition of Care [facility]) - Discharge Note   Patient Details  Name: Tony Williams MRN: [id number] Date of Birth: Aug 12, 1945  Transition of Care [facility]) CM/SW Contact:  Caylor Cerino C Yun Gutierrez, RN Phone Number: 03/15/2023, 12:19 PM   Clinical Narrative:    Patient discharging home with [facility]. Face sheet and medical necessity forms printed to floor to add to EMS packet. EMS arranged.  Patient's daughter notified. His spouse will be home to let EMS in.   [facility] signing off.            Patient Goals and CMS Choice            Discharge Placement                       Discharge Plan and Services Additional resources added to the After Visit Summary for                                       Social Drivers of Health (SDOH) Interventions SDOH Screenings   Food Insecurity: No Food Insecurity (02/15/2023)  Housing: Low Risk  (02/24/2023)  Transportation Needs: No Transportation Needs (02/18/2023)  Utilities: Not At Risk (02/15/2023)  Alcohol  Screen: Low Risk  (08/10/2022)  Depression (PHQ2-9): Low Risk  (08/10/2022)  Financial Resource Strain: Low Risk  (02/18/2023)  Physical Activity: Inactive (05/19/2021)  Social Connections: Moderately Integrated (03/15/2023)  Stress: No Stress Concern Present (05/19/2021)  Recent Concern: Stress - Stress Concern Present (02/26/2021)  Tobacco Use: Medium Risk (02/18/2023)     Readmission Risk Interventions    02/15/2023    9:37 AM 12/11/2020   11:06 AM  Readmission Risk Prevention Plan  Transportation Screening Complete Complete  PCP or Specialist Appt within 5-7 Days  Complete  PCP or Specialist Appt within 3-5 Days Complete   Medication Review (RN CM)  Complete  HRI or Home Care Consult Complete   Social Work Consult for Recovery Care Planning/Counseling Complete   Palliative Care Screening Not Applicable   Medication Review Oceanographer) Not Complete   Med Review Comments patient will discuss medication upon discharge

## 2023-03-15 NOTE — Plan of Care (Signed)
  Problem: Coping: Goal: Ability to adjust to condition or change in health will improve Outcome: Progressing   Problem: Fluid Volume: Goal: Ability to maintain a balanced intake and output will improve Outcome: Progressing   Problem: Health Behavior/Discharge Planning: Goal: Ability to identify and utilize available resources and services will improve Outcome: Progressing Goal: Ability to manage health-related needs will improve Outcome: Progressing

## 2023-03-15 NOTE — Plan of Care (Signed)
 PMT discussed goals of care with family yesterday.  Per epic chat with hospice liaison, attending, and care team, patient and family have opted for discharge home with hospice.  This has been fully arranged and prescriptions provided.  PMT will sign off at this time, please reconsult if needs arise.

## 2023-03-15 NOTE — Discharge Summary (Signed)
 Physician Discharge Summary  Tony Williams FMW:[id number] DOB: [birth year] DOA: 02/14/2023  PCP: Tony Jon HERO, MD  Admit date: 02/14/2023 Discharge date: 03/15/2023  Admitted From: home  Disposition:  home w/ hospice   Recommendations for Outpatient Follow-up:  Follow up with hospice provider ASAP  Home Health: no  Equipment/Devices:  Discharge Condition: hospice CODE STATUS: full  Diet recommendation: as tolerated   Brief/Interim Summary: HPI was taken from Tony Williams is a 77 y.o. male with medical history significant of dCHF, HLD, HTN, DM, COPD on 3L oxygen , CAD (s/p of CABG and stent), gout, CKD-3b, depression with anxiety, morbid obesity, OSA, former smoker, bilateral cancer, who presents with SOB.   Patient states that he has shortness of breath in the past several weeks which has been progressively worsening.  Patient is dry cough, no fever or chills.  He had mild substernal chest pain earlier, which has resolved. Patient also has worsening bilateral lower leg edema and 15 pounds of weight gain recently.  Patient has nausea, no vomiting, diarrhea or abdominal pain.  No symptoms of UTI.  Patient states he has intermittent bilateral blurry vision for more than 3 weeks.  He reports mild numbness in both feet, and generalized weakness.  No unilateral weakness in extremities.  No facial droop or slurred speech.   Patient is normally using 3 L oxygen  at baseline, but found to have acute respiratory distress, with oxygen  desaturation to 82% and difficulty speaking in full sentence, currently requiring 6 L oxygen  with 93% of saturation.   Data reviewed independently and ED Course: pt was found to have BNP 627.9, troponin 609, worsening renal function with creatinine 2.79, BUN 61, GFR 23 (recent baseline creatinine 2.12 on 08/31/2022).  Temperature normal, blood pressure 125/54, heart rate 68, RR 17.  Patient is admitted to PCU as inpatient. Consulted Tony Williams of card  who will inform provider tomorrow.    Patient pulled out Foley catheter on 12/10 and had hematuria and clots.  Foley placed on 12/11.  Patient had suprapubic catheter placed on 12/14.   12/15.  Creatinine increased to 3.05.  Held torsemide  and gentle IV fluids overnight.  Hemoglobin did drift down to 9.7.  Continue to watch closely. 12/16.  Creatinine down to 2.27.  Discontinue fluids.  Hemoglobin drifted down to 8.8.  Recheck creatinine tomorrow.  Recheck hemoglobin tomorrow.  Hold aspirin  and heparin  subcutaneous injection. 12/17.  Creatinine down to 2.15 and hemoglobin 8.7.  Notified by transitional care team the rehab that they were planning on going to is out of network with his insurance.  TOC to expand bed search.   As per Tony Williams 12/18-12/22/24: Pt's Cr started to rise again so torsemide  was held and started IVFs x 1 day and trending down slightly today. Of note, insurance Tony Williams was denied b/c pt was not able to participate w/ therapy b/c of dizziness but Tony Williams reapplied for insurance auth and that is pending currently.    As per Tony Williams: 12/23.  Patient complaining of nausea consistently for the past 3 days.  He has been able to eat but still feeling very nauseous.  Patient states he is claustrophobic and cannot get into MRI.  CT scan of the head negative.  Patient also complained of chest pain.  First troponin 192 and second troponin up at 580. 12/24.  Troponin went up to 5785.  Cardiac catheterization showed severe three-vessel disease.  Cardiology recommended medical management.  Small dose of Toprol  added. 12/25.  Nausea  a little bit improved with the Phenergan  rather than the Zofran . 12/26.  Asked TOC to reapply for insurance authorization.  Started Protonix .  Will give IV iron  with hemoglobin dipping down to 8.2 this morning but up to 8.7 this afternoon.  Discharge Diagnoses:  Principal Problem:   NSTEMI (non-ST elevated myocardial infarction) (HCC) Active Problems:   Acute  kidney injury superimposed on CKD (HCC)   Acute on chronic systolic CHF (congestive heart failure) (HCC)   Acute on chronic respiratory failure with hypoxia (HCC)   Drop in hemoglobin   Chronic obstructive pulmonary disease (HCC)   Hypertension associated with diabetes (HCC)   Hyperlipidemia associated with type 2 diabetes mellitus (HCC)   Type II diabetes mellitus with renal manifestations (HCC)   CAD (coronary artery disease)   Myocardial injury   Blurry vision, bilateral   Chronic gout due to renal impairment without tophus   Depression with anxiety   OSA (obstructive sleep apnea)   Obesity, Class III, BMI 40-49.9 (morbid obesity) (HCC)   Dilated cardiomyopathy (HCC)   Acute delirium   Nausea   Acute on chronic congestive heart failure (HCC)  Failure to thrive: secondary to all below. Palliative care consulted and pt and pt's family decided to proceed w/ home w/ hospice.   Acute on chronic systolic CHF: last echo shows an EF of 30 to 35%. Continue on torsemide , metoprolol , imdur , hydralazine . Cardio recs apprec    AKI on CKDIIIb: Cr is labile. Avoid nephrotoxic meds    Acute on chronic hypoxic respiratory failure: continue on supplemental oxygen     Acute blood loss anemia: H&H are labile. Will transfuse if Hb < 7.0    Urinary retention: s/p suprapubic cath place by uro 12/14.  Outpatient referral for Dr. Loa in [facility] was placed by inpatient uro    Blurry vision: bilateral. D/c toviaz     COPD: w/o acute exacerbation.Bronchodilators prn    DM2: well controlled, HbA1c 6.8. Diet controlled    Depression: severity unknown. Continue on lexapro     OSA: pt refuses to use his CPAP here at the hospital    Morbid obesity: BMI 39.8. Complicates overall care & prognosis    Acute delirium: continue on seroquel     Constipation: lactulose  prn   Discharge Instructions  Discharge Instructions     AMB referral to Phase II Cardiac Rehabilitation   Complete by: As  directed    Diagnosis: NSTEMI   After initial evaluation and assessments completed: Virtual Based Care may be provided alone or in conjunction with Phase 2 Cardiac Rehab based on patient barriers.: Yes   Intensive Cardiac Rehabilitation (ICR) [facility] location only OR Traditional Cardiac Rehabilitation (TCR) *If criteria for ICR are not met will enroll in TCR [facility] only): Yes   Diet general   Complete by: As directed    Discharge instructions   Complete by: As directed    F/u w/ hospice provider as soon as possible   Increase activity slowly   Complete by: As directed       Allergies as of 03/15/2023       Reactions   Morphine  Itching   Redness around IV site   Dilaudid  [hydromorphone ] Hives   Rash and itch   Butorphanol Itching   Pedi-pre Tape Spray [wound Dressing Adhesive] Itching   Statins Other (See Comments)   Destroyed my muscles   Zocor [simvastatin] Other (See Comments)   Destroyed my muscles        Medication List  STOP taking these medications    amLODipine  10 MG tablet Commonly known as: NORVASC    carvedilol  3.125 MG tablet Commonly known as: COREG    cloNIDine  0.1 MG tablet Commonly known as: CATAPRES    HYDROcodone -acetaminophen  5-325 MG tablet Commonly known as: NORCO/VICODIN   metolazone  2.5 MG tablet Commonly known as: ZAROXOLYN    predniSONE  10 MG tablet Commonly known as: DELTASONE    pregabalin  100 MG capsule Commonly known as: LYRICA        TAKE these medications    acetaminophen  325 MG tablet Commonly known as: TYLENOL  Take 2 tablets (650 mg total) by mouth every 6 (six) hours as needed for mild pain (or Fever >/= 101).   albuterol  108 (90 Base) MCG/ACT inhaler Commonly known as: VENTOLIN  HFA INHALE 2 PUFFS INTO THE LUNGS EVERY 6 HOURS AS NEEDED FOR WHEEZING OR SHORTNESS OF BREATH   albuterol  (2.5 MG/3ML) 0.083% nebulizer solution Commonly known as: PROVENTIL  Take 3 mLs (2.5 mg total) by nebulization every 6 (six) hours as  needed for wheezing or shortness of breath.   allopurinol  300 MG tablet Commonly known as: ZYLOPRIM  Take 1 tablet (300 mg total) by mouth daily.   aspirin  EC 81 MG tablet Take 81 mg by mouth daily. Swallow whole.   Breztri  Aerosphere 160-9-4.8 MCG/ACT Aero Generic drug: Budeson-Glycopyrrol-Formoterol  Inhale 2 puffs into the lungs 2 (two) times daily.   clopidogrel  75 MG tablet Commonly known as: PLAVIX  TAKE 1 TABLET(75 MG) BY MOUTH DAILY   Co Q-10 400 MG Caps Take 400 mg by mouth daily at 6 (six) AM.   colchicine  0.6 MG tablet Take 1 tablet (0.6 mg total) by mouth 2 (two) times daily.   desoximetasone  0.25 % cream Commonly known as: TOPICORT  APPLY TOPICALLY TO THE AFFECTED AREA TWICE DAILY   escitalopram  20 MG tablet Commonly known as: LEXAPRO  TAKE 1 TABLET(20 MG) BY MOUTH DAILY   fenofibrate  145 MG tablet Commonly known as: Tricor  Take 1 tablet (145 mg total) by mouth daily.   fentaNYL  12 MCG/HR Commonly known as: DURAGESIC  Place 1 patch onto the skin every 3 (three) days for 3 days. Pt is on hospice   Fish Oil 1200 MG Caps Take 1,200 mg by mouth daily at 6 (six) AM.   gabapentin  300 MG capsule Commonly known as: NEURONTIN  Take 300 mg by mouth 3 (three) times daily.   glimepiride  2 MG tablet Commonly known as: AMARYL  Take 1 tablet (2 mg total) by mouth daily with breakfast. TAKE 1 TABLET(1 MG) BY MOUTH DAILY WITH BREAKFAST   hydrALAZINE  10 MG tablet Commonly known as: APRESOLINE  Take 1 tablet (10 mg total) by mouth every 8 (eight) hours. What changed:  medication strength See the new instructions.   isosorbide  mononitrate 60 MG 24 hr tablet Commonly known as: IMDUR  Take 1 tablet (60 mg total) by mouth daily. Start taking on: March 16, 2023 What changed:  medication strength how much to take   loratadine  10 MG tablet Commonly known as: CLARITIN  Take 1 tablet (10 mg total) by mouth daily.   lovastatin  40 MG tablet Commonly known as: MEVACOR  TAKE  1 TABLET(40 MG) BY MOUTH DAILY   nitroGLYCERIN  0.4 MG SL tablet Commonly known as: NITROSTAT  Place 1 tablet (0.4 mg total) under the tongue every 5 (five) minutes as needed for chest pain.   Oxycodone  HCl 10 MG Tabs Take 1 tablet (10 mg total) by mouth every 4 (four) hours as needed for up to 3 days for moderate pain (pain score 4-6) or severe pain (  pain score 7-10). Pt is on hospice What changed:  medication strength how much to take when to take this reasons to take this additional instructions   prochlorperazine  5 MG tablet Commonly known as: COMPAZINE  Take 1 tablet (5 mg total) by mouth every 6 (six) hours as needed for nausea or vomiting.   Torsemide  40 MG Tabs Take 40 mg by mouth daily. Start taking on: March 16, 2023 What changed:  medication strength how much to take   traZODone  150 MG tablet Commonly known as: DESYREL  TAKE 1 TABLET(150 MG) BY MOUTH AT BEDTIME   VITAMIN D3 PO Take 4,000 Units by mouth.        Contact information for follow-up providers     [facility]. Go in 17 day(s).   Specialty: Cardiology Why: Hospital Follow-Up 03/07/23@ 2:30 PM Please bring all medications to follow-up appointment [facility], [address] Free [employer] Services at the door Contact information: [address] [place]  [address] [phone]             Contact information for after-discharge care     Destination     [facility] .   Service: Skilled Nursing Contact information: [address] Test Update Address [place]  [address] [phone]                    Allergies  Allergen Reactions   Morphine  Itching    Redness around IV site   Dilaudid  [Hydromorphone ] Hives    Rash and itch   Butorphanol Itching   Pedi-Pre Tape Spray [Wound Dressing Adhesive] Itching   Statins Other (See Comments)    Destroyed my muscles    Zocor [Simvastatin] Other (See Comments)    Destroyed my muscles    Consultations: Uro Nephro Cardio Palliative care   Procedures/Studies: DG Chest Port 1 View Result Date: 03/11/2023 CLINICAL DATA:  Shortness of breath. EXAM: PORTABLE CHEST 1 VIEW COMPARISON:  Chest radiograph dated 02/14/2023. FINDINGS: There is cardiomegaly with vascular congestion. Similar appearance of right lung base atelectasis/scarring. Pneumonia is not excluded. No pneumothorax. Median sternotomy wires. No acute osseous pathology. IMPRESSION: 1. Cardiomegaly with vascular congestion. 2. Right lung base atelectasis/scarring. Electronically Signed   By: Vanetta Chou M.D.   On: 03/11/2023 16:40   CARDIAC CATHETERIZATION Result Date: 03/08/2023   Dist LM lesion is 90% stenosed.   Mid LAD lesion is 100% stenosed.   Ost Cx to Prox Cx lesion is 100% stenosed.   Prox Graft lesion between 2nd Diag and Mid LAD  is 90% stenosed.   Origin lesion is 100% stenosed.   1st Diag lesion is 100% stenosed.   Mid LM lesion is 95% stenosed.   Ost RCA to Prox RCA lesion is 100% stenosed.   Insertion lesion before 2nd Diag  is 20% stenosed.   Previously placed Dist Graft stent of unknown type  before 2nd Diag is  widely patent.   SVG and is large.   SVG. 1.  Severe underlying three-vessel coronary artery disease with patent LIMA to diagonal and SVG to LAD with patent stents in this graft.  Chronically occluded SVG to OM.  The native right coronary artery was open before but diffusely diseased.  However, it is now occluded proximally with collaterals noted from the left side. 2.  Left ventricular angiography was not performed due to chronic kidney disease. 3.  Mildly elevated left ventricular end-diastolic pressure at 18 mmHg.  Recommendations: Suspect that occluded RCA is the likely culprit for myocardial infarction.  The vessel was severely diseased before and is not suitable for PCI now.  There is also severe stenosis in the left main  artery supplying mainly few septal branches.  Thus, risks of PCI outweighed the benefit here.  Recommend medical therapy as there are no revascularization options to the chronically occluded left circumflex and right coronary arteries. I added small dose Toprol . Will give gentle hydration to decrease the chance of contrast-induced nephropathy.   CT HEAD WO CONTRAST ( ) Result Date: 03/07/2023 CLINICAL DATA:  Vertigo, central EXAM: CT HEAD WITHOUT CONTRAST TECHNIQUE: Contiguous axial images were obtained from the base of the skull through the vertex without intravenous contrast. RADIATION DOSE REDUCTION: This exam was performed according to the departmental dose-optimization program which includes automated exposure control, adjustment of the mA and/or kV according to patient size and/or use of iterative reconstruction technique. COMPARISON:  CT Head 11/10/21 FINDINGS: Brain: No hemorrhage. No hydrocephalus. No extra-axial fluid collection. No CT evidence of an acute cortical infarct. No mass effect. No mass lesion. Encephalomalacia in the inferior medial left frontal lobe. This may be posttraumatic or related to a chronic infarct. Vascular: No hyperdense vessel or unexpected calcification. Skull: Normal. Negative for fracture or focal lesion. Sinuses/Orbits: No middle ear or mastoid effusion. Paranasal sinuses are clear. Bilateral lens replacement. Orbits are otherwise unremarkable. Other: None. IMPRESSION: No hemorrhage or CT evidence of an acute cortical infarct. Electronically Signed   By: Lyndall Gore M.D.   On: 03/07/2023 14:55   DG OR UROLOGY CYSTO IMAGE ([facility] ONLY) Result Date: 02/26/2023 There is no interpretation for this exam.  This order is for images obtained during a surgical procedure.  Please See Surgeries Tab for more information regarding the procedure.   US  RENAL Result Date: 02/17/2023 CLINICAL DATA:  Acute renal insufficiency. EXAM: RENAL / URINARY TRACT ULTRASOUND COMPLETE  COMPARISON:  CT abdomen pelvis dated 08/31/2022. FINDINGS: Evaluation is very limited due to body habitus and anasarca. Right Kidney: Renal measurements: 14.8 x 5.4 x 6.2 Tony Williams = volume: 258 mL. Mild parenchyma atrophy. Normal echogenicity. No hydronephrosis or shadowing stone. Left Kidney: Renal measurements: 12.8 x 7.1 x 7.0 Tony Williams = volume: 331 mL. Moderate parenchyma atrophy. Normal echogenicity. Several cystic lesions measure up to 4.1 Tony Williams. Evaluation is limited due to body habitus and technique. There is no hydronephrosis or shadowing stone. Bladder: The urinary bladder is decompressed and not visualized. Other: None. IMPRESSION: 1. Mild to moderate bilateral renal parenchyma atrophy. No hydronephrosis or shadowing stone. 2. Suboptimal evaluation of left renal cysts. Electronically Signed   By: Vanetta Chou M.D.   On: 02/17/2023 15:39   ECHOCARDIOGRAM COMPLETE Result Date: 02/15/2023    ECHOCARDIOGRAM REPORT   Patient Name:   Tony Williams Date of Exam: 02/15/2023 Medical Rec #:  [id number]        Height:       70.0 in Accession #:    [id number]       Weight:       315.0 lb Date of Birth:  13-Jul-1945        BSA:          2.532 m Patient Age:    77 years         BP:           112/50 mmHg Patient Gender: M                HR:  70 bpm. Exam Location:  [facility] Procedure: 2D Echo, Cardiac Doppler, Color Doppler and Intracardiac            Opacification Agent Indications:     CHF  History:         Patient has prior history of Echocardiogram examinations, most                  recent 10/29/2021. CHF, CAD, Previous Myocardial Infarction and                  Angina, Prior CABG, COPD, Arrythmias:Bradycardia,                  Signs/Symptoms:Dizziness/Lightheadedness and Dyspnea; Risk                  Factors:Hypertension, Diabetes, Dyslipidemia, Sleep Apnea and                  Former Smoker. CKD.  Sonographer:     Naomie Reef Referring Phys:  [id number] XILIN NIU Diagnosing Phys: Lonni Hanson MD  Sonographer  Comments: Technically difficult study due to poor echo windows. Image acquisition challenging due to patient body habitus. IMPRESSIONS  1. Left ventricular ejection fraction, by estimation, is 30 to 35%. The left ventricle has moderately decreased function. Left ventricular endocardial border not optimally defined to evaluate regional wall motion. The left ventricular internal cavity size was mildly to moderately dilated. There is mild left ventricular hypertrophy. Left ventricular diastolic parameters are consistent with Grade II diastolic dysfunction (pseudonormalization). Elevated left atrial pressure.  2. Right ventricular systolic function was not well visualized. The right ventricular size is not well visualized.  3. Left atrial size was mildly dilated.  4. Right atrial size was moderately dilated.  5. The mitral valve was not well visualized. No evidence of mitral valve regurgitation. No evidence of mitral stenosis.  6. The aortic valve has an indeterminant number of cusps. Aortic valve regurgitation is trivial.  7. Pulmonic valve regurgitation not well-assessed. FINDINGS  Left Ventricle: Left ventricular ejection fraction, by estimation, is 30 to 35%. The left ventricle has moderately decreased function. Left ventricular endocardial border not optimally defined to evaluate regional wall motion. Definity  contrast agent was given IV to delineate the left ventricular endocardial borders. The left ventricular internal cavity size was mildly to moderately dilated. There is mild left ventricular hypertrophy. Left ventricular diastolic parameters are consistent with Grade II  diastolic dysfunction (pseudonormalization). Elevated left atrial pressure. Right Ventricle: The right ventricular size is not well visualized. Right vetricular wall thickness was not well visualized. Right ventricular systolic function was not well visualized. Left Atrium: Left atrial size was mildly dilated. Right Atrium: Right atrial size  was moderately dilated. Pericardium: There is no evidence of pericardial effusion. Mitral Valve: The mitral valve was not well visualized. There is mild thickening of the mitral valve leaflet(s). No evidence of mitral valve regurgitation. No evidence of mitral valve stenosis. MV peak gradient, 7.1 mmHg. The mean mitral valve gradient is 2.0 mmHg. Tricuspid Valve: The tricuspid valve is not well visualized. Tricuspid valve regurgitation is trivial. Aortic Valve: The aortic valve has an indeterminant number of cusps. Aortic valve regurgitation is trivial. Aortic valve mean gradient measures 5.0 mmHg. Aortic valve peak gradient measures 9.5 mmHg. Aortic valve area, by VTI measures 2.60 Tony Williams. Pulmonic Valve: The pulmonic valve was not well visualized. Pulmonic valve regurgitation not well-assessed. Aorta: The aortic root is normal in size and structure. Pulmonary Artery: The pulmonary artery is  not well seen. Venous: The inferior vena cava was not well visualized. IAS/Shunts: The interatrial septum was not well visualized.  LEFT VENTRICLE PLAX 2D LVIDd:         6.10 Tony Williams   Diastology LVIDs:         5.40 Tony Williams   LV e' medial:    5.87 Tony Williams/s LV PW:         1.10 Tony Williams   LV E/e' medial:  19.6 LV IVS:        1.30 Tony Williams   LV e' lateral:   6.74 Tony Williams/s LVOT diam:     2.20 Tony Williams   LV E/e' lateral: 17.1 LV SV:         92 LV SV Index:   36 LVOT Area:     3.80 Tony Williams  LEFT ATRIUM             Index        RIGHT ATRIUM           Index LA diam:        5.90 Tony Williams 2.33 Tony Williams/m   RA Area:     28.50 Tony Williams LA Vol (A2C):   91.6 ml 36.18 ml/m  RA Volume:   113.00 ml 44.63 ml/m LA Vol (A4C):   86.9 ml 34.32 ml/m LA Biplane Vol: 90.1 ml 35.59 ml/m  AORTIC VALVE AV Area (Vmax):    2.84 Tony Williams AV Area (Vmean):   2.47 Tony Williams AV Area (VTI):     2.60 Tony Williams AV Vmax:           154.00 Tony Williams/s AV Vmean:          102.000 Tony Williams/s AV VTI:            0.352 m AV Peak Grad:      9.5 mmHg AV Mean Grad:      5.0 mmHg LVOT Vmax:         115.00 Tony Williams/s LVOT Vmean:        66.200 Tony Williams/s LVOT VTI:           0.241 m LVOT/AV VTI ratio: 0.68  AORTA Ao Root diam: 3.70 Tony Williams Ao Asc diam:  3.00 Tony Williams MITRAL VALVE                TRICUSPID VALVE MV Area (PHT): 2.94 Tony Williams     TR Peak grad:   21.0 mmHg MV Area VTI:   2.07 Tony Williams     TR Vmax:        229.00 Tony Williams/s MV Peak grad:  7.1 mmHg MV Mean grad:  2.0 mmHg     SHUNTS MV Vmax:       1.33 m/s     Systemic VTI:  0.24 m MV Vmean:      70.0 Tony Williams/s    Systemic Diam: 2.20 Tony Williams MV Decel Time: 258 msec MV E velocity: 115.00 Tony Williams/s MV A velocity: 106.00 Tony Williams/s MV E/A ratio:  1.08 Lonni End MD Electronically signed by Lonni Hanson MD Signature Date/Time: 02/15/2023/5:29:17 PM    Final    DG Chest Port 1 View Result Date: 02/14/2023 CLINICAL DATA:  Shortness of breath EXAM: PORTABLE CHEST 1 VIEW COMPARISON:  08/04/2022, 12/28/2021, 01/28/2021 FINDINGS: Cardio post sternotomy changes. Cardiomegaly with vascular congestion and probable mild interstitial edema. Chronic pleural and parenchymal scarring on the right. No visible pneumothorax. IMPRESSION: Cardiomegaly with vascular congestion and probable mild interstitial edema. Chronic pleural and parenchymal scarring in the right thorax Electronically Signed   By: Luke Scott Tony Williams.D.  On: 02/14/2023 20:38   CT HEAD WO CONTRAST ( ) Result Date: 02/14/2023 CLINICAL DATA:  Neuro deficit with acute stroke suspected EXAM: CT HEAD WITHOUT CONTRAST TECHNIQUE: Contiguous axial images were obtained from the base of the skull through the vertex without intravenous contrast. RADIATION DOSE REDUCTION: This exam was performed according to the departmental dose-optimization program which includes automated exposure control, adjustment of the mA and/or kV according to patient size and/or use of iterative reconstruction technique. COMPARISON:  11/10/2021 FINDINGS: Brain: No evidence of acute infarction, hemorrhage, hydrocephalus, extra-axial collection or mass lesion/mass effect. Chronic small vessel ischemia in the cerebral white matter that is unchanged. Small  area of encephalomalacia in the anterior left frontal lobe, likely post ischemic, unchanged. Vascular: No hyperdense vessel or unexpected calcification. Skull: Normal. Negative for fracture or focal lesion. Sinuses/Orbits: No acute finding. IMPRESSION: No acute finding.  Stable chronic ischemic injury Electronically Signed   By: Dorn Roulette M.D.   On: 02/14/2023 19:51   (Echo, Carotid, EGD, Colonoscopy, ERCP)    Subjective: Pt c/o fatigue    Discharge Exam: Vitals:   03/15/23 0830 03/15/23 1148  BP: (!) 117/53 128/63  Pulse: 68 82  Resp:    Temp:  98.7 F (37.1 C)  SpO2: 100% 100%   Vitals:   03/14/23 2357 03/15/23 0337 03/15/23 0830 03/15/23 1148  BP: (!) 105/47 (!) 108/59 (!) 117/53 128/63  Pulse: 61 64 68 82  Resp: 17 18    Temp: 97.8 F (36.6 C) (!) 97.5 F (36.4 C)  98.7 F (37.1 C)  TempSrc: Oral Oral  Oral  SpO2: 99% 100% 100% 100%  Weight:  122.7 kg    Height:        General: Pt is alert, awake, not in acute distress Cardiovascular: S1/S2 +, no rubs, no gallops Respiratory: decreased breath sounds b/l  Abdominal: Soft, NT, obese, bowel sounds + Extremities: no cyanosis    The results of significant diagnostics from this hospitalization (including imaging, microbiology, ancillary and laboratory) are listed below for reference.     Microbiology: No results found for this or any previous visit (from the past 240 hours).   Labs: BNP (last 3 results) Recent Labs    02/14/23 1642  BNP 627.9*   Basic Metabolic Panel: Recent Labs  Lab 03/11/23 0621 03/12/23 0434 03/13/23 0407 03/14/23 0251 03/15/23 0542  NA 136 137 136 137 137  K 3.7 3.5 3.3* 3.4* 3.8  CL 99 97* 97* 96* 94*  CO2 29 28 27 31  32  GLUCOSE 107* 121* 132* 128* 134*  BUN 33* 33* 31* 28* 26*  CREATININE 2.34* 2.21* 2.48* 2.34* 2.26*  CALCIUM  8.7* 8.8* 8.3* 8.5* 8.8*   Liver Function Tests: No results for input(s): AST, ALT, ALKPHOS, BILITOT, PROT, ALBUMIN  in the last  168 hours. No results for input(s): LIPASE, AMYLASE in the last 168 hours. No results for input(s): AMMONIA in the last 168 hours. CBC: Recent Labs  Lab 03/10/23 0438 03/10/23 1335 03/11/23 0621 03/12/23 0434 03/13/23 0407 03/14/23 0251 03/15/23 0542  WBC 7.3  --   --  7.2 7.5 7.8 6.4  HGB 8.2*   < > 7.8* 8.3* 8.1* 8.2* 8.8*  HCT 26.5*  --   --  26.7* 25.9* 26.5* 28.9*  MCV 94.0  --   --  92.4 91.2 91.4 94.1  PLT 269  --   --  254 248 247 244   < > = values in this interval not displayed.   Cardiac Enzymes: No  results for input(s): CKTOTAL, CKMB, CKMBINDEX, TROPONINI in the last 168 hours. BNP: Invalid input(s): POCBNP CBG: Recent Labs  Lab 03/13/23 2208  GLUCAP 159*   D-Dimer No results for input(s): DDIMER in the last 72 hours. Hgb A1c No results for input(s): HGBA1C in the last 72 hours. Lipid Profile No results for input(s): CHOL, HDL, LDLCALC, TRIG, CHOLHDL, LDLDIRECT in the last 72 hours. Thyroid  function studies No results for input(s): TSH, T4TOTAL, T3FREE, THYROIDAB in the last 72 hours.  Invalid input(s): FREET3 Anemia work up No results for input(s): VITAMINB12, FOLATE, FERRITIN, TIBC, IRON , RETICCTPCT in the last 72 hours. Urinalysis    Component Value Date/Time   COLORURINE RED (A) 02/20/2023 1408   APPEARANCEUR CLOUDY (A) 02/20/2023 1408   LABSPEC  02/20/2023 1408    TEST NOT REPORTED DUE TO COLOR INTERFERENCE OF URINE PIGMENT   PHURINE  02/20/2023 1408    TEST NOT REPORTED DUE TO COLOR INTERFERENCE OF URINE PIGMENT   GLUCOSEU (A) 02/20/2023 1408    TEST NOT REPORTED DUE TO COLOR INTERFERENCE OF URINE PIGMENT   HGBUR (A) 02/20/2023 1408    TEST NOT REPORTED DUE TO COLOR INTERFERENCE OF URINE PIGMENT   BILIRUBINUR (A) 02/20/2023 1408    TEST NOT REPORTED DUE TO COLOR INTERFERENCE OF URINE PIGMENT   BILIRUBINUR negative 04/18/2020 1349   KETONESUR (A) 02/20/2023 1408    TEST NOT REPORTED DUE  TO COLOR INTERFERENCE OF URINE PIGMENT   PROTEINUR (A) 02/20/2023 1408    TEST NOT REPORTED DUE TO COLOR INTERFERENCE OF URINE PIGMENT   UROBILINOGEN 0.2 04/18/2020 1349   NITRITE (A) 02/20/2023 1408    TEST NOT REPORTED DUE TO COLOR INTERFERENCE OF URINE PIGMENT   LEUKOCYTESUR (A) 02/20/2023 1408    TEST NOT REPORTED DUE TO COLOR INTERFERENCE OF URINE PIGMENT   Sepsis Labs Recent Labs  Lab 03/12/23 0434 03/13/23 0407 03/14/23 0251 03/15/23 0542  WBC 7.2 7.5 7.8 6.4   Microbiology No results found for this or any previous visit (from the past 240 hours).   Time coordinating discharge: Over 30 minutes  SIGNED:   Anthony CHRISTELLA Pouch, MD  [facility] 03/15/2023, 12:51 PM Pager   If 7PM-7AM, please contact night-coverage [email or web address]

## 2023-03-16 ENCOUNTER — Other Ambulatory Visit: Payer: Self-pay | Admitting: Family Medicine

## 2023-03-17 ENCOUNTER — Encounter: Payer: Medicare Other | Admitting: Family

## 2023-03-17 ENCOUNTER — Telehealth: Payer: Self-pay

## 2023-03-17 NOTE — Transitions of Care (Post Inpatient/ED Visit) (Signed)
   03/17/2023  Name: Tony Williams MRN: [id number] DOB: 10-09-1945  Today's TOC FU Call Status: Today's TOC FU Call Status:: Successful TOC FU Call Completed TOC FU Call Complete Date: 03/17/23 Patient's Name and Date of Birth confirmed.  Transition Care Management Follow-up Telephone Call Date of Discharge: 03/15/23 (Call completed wth daughter-Tony Williams) Discharge Facility: [facility]) Type of Discharge: Inpatient Admission Primary Inpatient Discharge Diagnosis:: acute on chronic CHF How have you been since you were released from the hospital?:  (Daughter confirms that hospice is in place-came out to visit them yest-they have all the meds& equipment they need. Pt rested fairly well last night-hospice is asssiting with mgmt of [facility]-she is aware to contact hospice 24/7 for any needs or concerns) Any questions or concerns?: No  Items Reviewed: Did you receive and understand the discharge instructions provided?: Yes Medications obtained,verified, and reconciled?: No Medications Not Reviewed Reasons:: Other: (pt active with hospice-hopsice RN reviwed meds with family during visit yest) Any new allergies since your discharge?:  (n/a) Dietary orders reviewed?: NA Do you have support at home?: Yes People in Home: child(ren), adult, spouse Name of Support/Comfort Primary Source: wife and daughter  Medications Reviewed Today: Medications Reviewed Today   Medications were not reviewed in this encounter     Home Care and Equipment/Supplies: Were Home Health Services Ordered?: Yes Name of Home Health Agency:: Hospice Has Agency set up a time to come to your home?: Yes First Home Health Visit Date: 03/16/23 Any new equipment or medical supplies ordered?: Yes Name of Medical supply agency?: hospice Were you able to get the equipment/medical supplies?: Yes Do you have any questions related to the use of the equipment/supplies?: No  Functional Questionnaire: Do  you need assistance with bathing/showering or dressing?: Yes (family assisting with all ADLs/IADLs) Do you need assistance with meal preparation?: Yes Do you need assistance with eating?: No Do you have difficulty maintaining continence: Yes Do you need assistance with getting out of bed/getting out of a chair/moving?: Yes Do you have difficulty managing or taking your medications?: Yes  Follow up appointments reviewed: PCP Follow-up appointment confirmed?: NA (active w/ hospice) Specialist Hospital Follow-up appointment confirmed?: NA Do you need transportation to your follow-up appointment?: No Do you understand care options if your condition(s) worsen?: Yes-patient verbalized understanding (contact hospice for any needs or concerns)   TOC Interventions Today    Flowsheet Row Most Recent Value  TOC Interventions   TOC Interventions Discussed/Reviewed TOC Interventions Discussed      Interventions Today    Flowsheet Row Most Recent Value  Advanced Directive Interventions   Advanced Directives Discussed/Reviewed End of Life  End of Life Hospice       Rama Pilling, RN,BSN,CCM RN Care Manager Transitions of Care  [place]  Direct Phone: [phone] Toll Free: [phone] Fax: [phone]

## 2023-03-23 ENCOUNTER — Encounter: Payer: Medicare Other | Admitting: Family

## 2023-03-28 DIAGNOSIS — J449 Chronic obstructive pulmonary disease, unspecified: Secondary | ICD-10-CM | POA: Diagnosis not present

## 2023-04-01 DIAGNOSIS — G4733 Obstructive sleep apnea (adult) (pediatric): Secondary | ICD-10-CM | POA: Diagnosis not present

## 2023-04-16 DEATH — deceased

## 2023-05-30 ENCOUNTER — Ambulatory Visit: Payer: Medicare Other | Admitting: Cardiology
